# Patient Record
Sex: Female | Born: 1935 | Race: White | Hispanic: No | Smoking: Never smoker
Health system: Southern US, Community
[De-identification: ages and names within clinical notes are randomized; demographics above are authoritative.]

## PROBLEM LIST (undated history)

## (undated) DIAGNOSIS — I5032 Chronic diastolic (congestive) heart failure: Secondary | ICD-10-CM

## (undated) DIAGNOSIS — I4891 Unspecified atrial fibrillation: Secondary | ICD-10-CM

## (undated) DIAGNOSIS — I272 Pulmonary hypertension, unspecified: Secondary | ICD-10-CM

## (undated) DIAGNOSIS — H8110 Benign paroxysmal vertigo, unspecified ear: Secondary | ICD-10-CM

## (undated) DIAGNOSIS — I639 Cerebral infarction, unspecified: Secondary | ICD-10-CM

## (undated) DIAGNOSIS — D271 Benign neoplasm of left ovary: Secondary | ICD-10-CM

## (undated) DIAGNOSIS — E785 Hyperlipidemia, unspecified: Secondary | ICD-10-CM

## (undated) DIAGNOSIS — E079 Disorder of thyroid, unspecified: Secondary | ICD-10-CM

## (undated) DIAGNOSIS — I10 Essential (primary) hypertension: Secondary | ICD-10-CM

## (undated) DIAGNOSIS — H9190 Unspecified hearing loss, unspecified ear: Secondary | ICD-10-CM

## (undated) DIAGNOSIS — Z8679 Personal history of other diseases of the circulatory system: Secondary | ICD-10-CM

## (undated) HISTORY — DX: Chronic diastolic (congestive) heart failure: I50.32

## (undated) HISTORY — DX: Essential (primary) hypertension: I10

## (undated) HISTORY — DX: Hyperlipidemia, unspecified: E78.5

## (undated) HISTORY — DX: Personal history of other diseases of the circulatory system: Z86.79

## (undated) HISTORY — DX: Disorder of thyroid, unspecified: E07.9

## (undated) HISTORY — DX: Benign neoplasm of left ovary: D27.1

## (undated) HISTORY — DX: Benign paroxysmal vertigo, unspecified ear: H81.10

## (undated) HISTORY — DX: Pulmonary hypertension, unspecified: I27.20

---

## 1971-09-19 HISTORY — PX: ABDOMINAL HYSTERECTOMY: SHX81

## 1992-09-18 HISTORY — PX: HERNIA REPAIR: SHX51

## 1999-07-12 ENCOUNTER — Encounter: Admission: RE | Admit: 1999-07-12 | Discharge: 1999-10-10 | Payer: Self-pay | Admitting: Family Medicine

## 1999-08-29 ENCOUNTER — Ambulatory Visit (HOSPITAL_COMMUNITY): Admission: RE | Admit: 1999-08-29 | Discharge: 1999-08-29 | Payer: Self-pay | Admitting: Gastroenterology

## 2004-08-22 ENCOUNTER — Ambulatory Visit (HOSPITAL_COMMUNITY): Admission: RE | Admit: 2004-08-22 | Discharge: 2004-08-22 | Payer: Self-pay | Admitting: Gastroenterology

## 2005-07-20 ENCOUNTER — Observation Stay (HOSPITAL_COMMUNITY): Admission: EM | Admit: 2005-07-20 | Discharge: 2005-07-22 | Payer: Self-pay | Admitting: Gastroenterology

## 2005-08-01 ENCOUNTER — Ambulatory Visit (HOSPITAL_COMMUNITY): Admission: RE | Admit: 2005-08-01 | Discharge: 2005-08-01 | Payer: Self-pay | Admitting: Gastroenterology

## 2005-08-04 ENCOUNTER — Encounter (HOSPITAL_COMMUNITY): Admission: RE | Admit: 2005-08-04 | Discharge: 2005-09-15 | Payer: Self-pay | Admitting: Gastroenterology

## 2005-10-24 ENCOUNTER — Ambulatory Visit: Payer: Self-pay | Admitting: Ophthalmology

## 2005-11-02 ENCOUNTER — Encounter: Admission: RE | Admit: 2005-11-02 | Discharge: 2005-11-02 | Payer: Self-pay | Admitting: Gastroenterology

## 2005-11-10 ENCOUNTER — Encounter: Admission: RE | Admit: 2005-11-10 | Discharge: 2005-11-10 | Payer: Self-pay | Admitting: Ophthalmology

## 2005-12-21 LAB — HM PAP SMEAR: HM Pap smear: NORMAL

## 2010-02-20 LAB — HM DEXA SCAN: HM Dexa Scan: NORMAL

## 2011-12-22 ENCOUNTER — Encounter: Payer: Self-pay | Admitting: Internal Medicine

## 2011-12-22 ENCOUNTER — Ambulatory Visit (INDEPENDENT_AMBULATORY_CARE_PROVIDER_SITE_OTHER): Payer: Medicare Other | Admitting: Internal Medicine

## 2011-12-22 VITALS — BP 138/70 | HR 118 | Temp 98.2°F | Ht 60.5 in | Wt 206.0 lb

## 2011-12-22 DIAGNOSIS — E119 Type 2 diabetes mellitus without complications: Secondary | ICD-10-CM

## 2011-12-22 DIAGNOSIS — E039 Hypothyroidism, unspecified: Secondary | ICD-10-CM

## 2011-12-22 DIAGNOSIS — E785 Hyperlipidemia, unspecified: Secondary | ICD-10-CM

## 2011-12-22 DIAGNOSIS — I10 Essential (primary) hypertension: Secondary | ICD-10-CM

## 2011-12-22 DIAGNOSIS — Z23 Encounter for immunization: Secondary | ICD-10-CM

## 2011-12-22 NOTE — Assessment & Plan Note (Signed)
BP well controlled today. Will continue amlodipine, Lasix, and losartan hydrochlorothiazide. We'll check renal function with labs today. Followup in 3 months.

## 2011-12-22 NOTE — Assessment & Plan Note (Signed)
Will continue Synthroid. Will check TSH with labs today. Followup in 3 months.

## 2011-12-22 NOTE — Assessment & Plan Note (Signed)
Will check A1c with labs today. We'll continue metformin,glimepiride, and onglyza. Follow up 3 months.

## 2011-12-22 NOTE — Assessment & Plan Note (Signed)
We'll check lipids with labs today. Goal LDL less than 70. Followup 3 months.

## 2011-12-22 NOTE — Progress Notes (Signed)
Subjective:    Patient ID: Diane Mullins, female    DOB: 1932-12-12, 78 y.o.   MRN: [id number]  HPI 78 year old female with history of diabetes, hypertension, hyperlipidemia, and hypothyroidism presents to establish care. She reports that she is generally doing well. In regards to her diabetes, she reports full compliance with her medications. She is unsure of her last hemoglobin A1c. She did not bring a record of her blood sugars today.   In regards to her hypertension, she reports full compliance with her medications. She denies any headache, chest pain, palpitations.  In regards to hyperlipidemia, she reports full compliance with her statin medication. She denies any known side effects from this medicine such as myalgia.  In regards to hypothyroidism, she reports full compliance with Synthroid.  Outpatient Encounter Prescriptions as of 12/22/2011  Medication Sig Dispense Refill  . amLODipine (NORVASC) 5 MG tablet Take 5 mg by mouth daily.      Marland Kitchen aspirin EC 81 MG tablet Take 81 mg by mouth daily.      . calcium-vitamin D (OSCAL) 250-125 MG-UNIT per tablet Take 1 tablet by mouth daily.      . fish oil-omega-3 fatty acids 1000 MG capsule Take 2 g by mouth daily.      . furosemide (LASIX) 20 MG tablet Take 10-20 mg by mouth daily.      Marland Kitchen glimepiride (AMARYL) 4 MG tablet Take 2 mg by mouth 2 (two) times daily.      Marland Kitchen levothyroxine (SYNTHROID, LEVOTHROID) 50 MCG tablet Take 50 mcg by mouth daily.      Marland Kitchen losartan-hydrochlorothiazide (HYZAAR) 50-12.5 MG per tablet Take 1 tablet by mouth daily.      . metFORMIN (GLUCOPHAGE) 1000 MG tablet Take 1,000 mg by mouth 2 (two) times daily with a meal.      . Multiple Vitamins-Minerals (MULTIVITAMIN WITH MINERALS) tablet Take 1 tablet by mouth daily.      Marland Kitchen omeprazole (PRILOSEC) 20 MG capsule Take 20 mg by mouth daily.      . saxagliptin HCl (ONGLYZA) 5 MG TABS tablet Take 5 mg by mouth daily.      . simvastatin (ZOCOR) 40 MG tablet Take 40 mg by mouth  every evening.        Review of Systems  Constitutional: Negative for fever, chills, appetite change, fatigue and unexpected weight change.  HENT: Negative for ear pain, congestion, sore throat, trouble swallowing, neck pain, voice change and sinus pressure.   Eyes: Negative for visual disturbance.  Respiratory: Negative for cough, shortness of breath, wheezing and stridor.   Cardiovascular: Negative for chest pain, palpitations and leg swelling.  Gastrointestinal: Negative for nausea, vomiting, abdominal pain, diarrhea, constipation, blood in stool, abdominal distention and anal bleeding.  Genitourinary: Negative for dysuria and flank pain.  Musculoskeletal: Negative for myalgias, arthralgias and gait problem.  Skin: Negative for color change and rash.  Neurological: Negative for dizziness and headaches.  Hematological: Negative for adenopathy. Does not bruise/bleed easily.  Psychiatric/Behavioral: Negative for suicidal ideas, sleep disturbance and dysphoric mood. The patient is not nervous/anxious.    BP 138/70  Pulse 118  Temp(Src) 98.2 F (36.8 C) (Oral)  Ht 5' 0.5" (1.537 m)  Wt 206 lb (93.441 kg)  BMI 39.57 kg/m2  SpO2 97%     Objective:   Physical Exam  Constitutional: She is oriented to person, place, and time. She appears well-developed and well-nourished. No distress.  HENT:  Head: Normocephalic and atraumatic.  Right Ear: External ear normal.  Left Ear: External ear normal.  Nose: Nose normal.  Mouth/Throat: Oropharynx is clear and moist. No oropharyngeal exudate.  Eyes: Conjunctivae are normal. Pupils are equal, round, and reactive to light. Right eye exhibits no discharge. Left eye exhibits no discharge. No scleral icterus.  Neck: Normal range of motion. Neck supple. No tracheal deviation present. No thyromegaly present.  Cardiovascular: Normal rate, regular rhythm, normal heart sounds and intact distal pulses.  Exam reveals no gallop and no friction rub.   No  murmur heard. Pulmonary/Chest: Effort normal and breath sounds normal. No respiratory distress. She has no wheezes. She has no rales. She exhibits no tenderness.  Abdominal: Soft. Bowel sounds are normal. She exhibits no distension. There is no tenderness.  Musculoskeletal: Normal range of motion. She exhibits no edema and no tenderness.  Lymphadenopathy:    She has no cervical adenopathy.  Neurological: She is alert and oriented to person, place, and time. No cranial nerve deficit. She exhibits normal muscle tone. Coordination normal.  Skin: Skin is warm and dry. No rash noted. She is not diaphoretic. No erythema. No pallor.  Psychiatric: She has a normal mood and affect. Her behavior is normal. Judgment and thought content normal.          Assessment & Plan:

## 2011-12-25 ENCOUNTER — Other Ambulatory Visit: Payer: Self-pay | Admitting: Internal Medicine

## 2011-12-25 LAB — COMPREHENSIVE METABOLIC PANEL
AST: 17 IU/L (ref 0–40)
Albumin/Globulin Ratio: 1.8 (ref 1.1–2.5)
Albumin: 4.4 g/dL (ref 3.5–4.8)
Alkaline Phosphatase: 93 IU/L (ref 25–165)
Calcium: 9.5 mg/dL (ref 8.6–10.2)
GFR calc Af Amer: 74 mL/min/{1.73_m2} (ref >59)
GFR calc non Af Amer: 64 mL/min/{1.73_m2} (ref >59)
Globulin, Total: 2.5 g/dL (ref 1.5–4.5)
Glucose: 179 mg/dL — ABNORMAL HIGH (ref 65–99)
Potassium: 4.3 mmol/L (ref 3.5–5.2)
Sodium: 137 mmol/L (ref 134–144)
Total Bilirubin: 0.3 mg/dL (ref 0.0–1.2)
Total Protein: 6.9 g/dL (ref 6.0–8.5)

## 2011-12-25 LAB — CBC WITH DIFFERENTIAL
Basophils Absolute: 0.2 10*3/uL (ref 0.0–0.2)
Basos: 2 % (ref 0–3)
Eos: 3 % (ref 0–7)
Eosinophils Absolute: 0.2 10*3/uL (ref 0.0–0.4)
HCT: 37.7 % (ref 34.0–46.6)
Immature Granulocytes: 0 % (ref 0–2)
MCV: 83 fL (ref 79–97)
Monocytes Absolute: 0.9 10*3/uL (ref 0.1–1.0)
Monocytes: 11 % (ref 4–13)
Neutrophils Absolute: 4.6 10*3/uL (ref 1.8–7.8)
RBC: 4.56 x10E6/uL (ref 3.77–5.28)
WBC: 8.4 10*3/uL (ref 4.0–10.5)

## 2011-12-25 LAB — LIPID PANEL W/O CHOL/HDL RATIO: Cholesterol, Total: 170 mg/dL (ref 100–199)

## 2011-12-29 ENCOUNTER — Telehealth: Payer: Self-pay | Admitting: Internal Medicine

## 2011-12-29 NOTE — Telephone Encounter (Signed)
[id number] Pt called back to confirm she received you message she also would like to get onglyza  Samples if we have any.  Pt stated she had enough to get through the weekend. Please advise

## 2012-01-01 NOTE — Telephone Encounter (Signed)
Left message notifying her that we do not have onglyza samples.

## 2012-01-03 ENCOUNTER — Other Ambulatory Visit: Payer: Self-pay | Admitting: *Deleted

## 2012-01-03 MED ORDER — SAXAGLIPTIN HCL 5 MG PO TABS: 5.0000 mg | ORAL_TABLET | Freq: Every day | ORAL | Status: DC

## 2012-01-15 ENCOUNTER — Encounter: Payer: Self-pay | Admitting: Internal Medicine

## 2012-01-15 ENCOUNTER — Ambulatory Visit (INDEPENDENT_AMBULATORY_CARE_PROVIDER_SITE_OTHER): Payer: Medicare Other | Admitting: Internal Medicine

## 2012-01-15 VITALS — BP 140/74 | HR 82 | Temp 98.5°F | Resp 16 | Wt 204.2 lb

## 2012-01-15 DIAGNOSIS — J40 Bronchitis, not specified as acute or chronic: Secondary | ICD-10-CM | POA: Insufficient documentation

## 2012-01-15 MED ORDER — DOXYCYCLINE HYCLATE 100 MG PO TABS: 100.0000 mg | ORAL_TABLET | Freq: Two times a day (BID) | ORAL | Status: DC

## 2012-01-15 MED ORDER — GUAIFENESIN-CODEINE 100-10 MG/5ML PO SYRP: 5.0000 mL | ORAL_SOLUTION | Freq: Two times a day (BID) | ORAL | Status: AC | PRN

## 2012-01-15 MED ORDER — ALBUTEROL SULFATE HFA 108 (90 BASE) MCG/ACT IN AERS: 2.0000 | INHALATION_SPRAY | Freq: Four times a day (QID) | RESPIRATORY_TRACT | Status: DC | PRN

## 2012-01-15 MED ORDER — PREDNISONE (PAK) 10 MG PO TABS: ORAL_TABLET | ORAL | Status: AC

## 2012-01-15 NOTE — Progress Notes (Signed)
Subjective:    Patient ID: Diane Mullins, female    DOB: 04-24-1933, 78 y.o.   MRN: [id number]  HPI 78 year old female with history of diabetes presents with a one-week history of cough, shortness of breath, and malaise. She reports that symptoms first began 1 week ago with sore throat. Sore throat has now resolved. She is now developed cough which is generally nonproductive. She also has purulent sinus drainage. She denies any headache. She denies fever or chills. She has mild shortness of breath with exertion. She has taken over-the-counter Delsym with no improvement in her symptoms.  Outpatient Encounter Prescriptions as of 01/15/2012  Medication Sig Dispense Refill  . amLODipine (NORVASC) 5 MG tablet Take 5 mg by mouth daily.      Marland Kitchen aspirin EC 81 MG tablet Take 81 mg by mouth daily.      . calcium-vitamin D (OSCAL) 250-125 MG-UNIT per tablet Take 1 tablet by mouth daily.      . fish oil-omega-3 fatty acids 1000 MG capsule Take 2 g by mouth daily.      . furosemide (LASIX) 20 MG tablet Take 10-20 mg by mouth daily.      Marland Kitchen glimepiride (AMARYL) 4 MG tablet Take 2 mg by mouth 2 (two) times daily.      Marland Kitchen levothyroxine (SYNTHROID, LEVOTHROID) 50 MCG tablet Take 50 mcg by mouth daily.      Marland Kitchen losartan-hydrochlorothiazide (HYZAAR) 50-12.5 MG per tablet Take 1 tablet by mouth daily.      . metFORMIN (GLUCOPHAGE) 1000 MG tablet Take 1,000 mg by mouth 2 (two) times daily with a meal.      . Multiple Vitamins-Minerals (MULTIVITAMIN WITH MINERALS) tablet Take 1 tablet by mouth daily.      Marland Kitchen omeprazole (PRILOSEC) 20 MG capsule Take 20 mg by mouth daily.      . saxagliptin HCl (ONGLYZA) 5 MG TABS tablet Take 1 tablet (5 mg total) by mouth daily.  14 tablet  0  . simvastatin (ZOCOR) 40 MG tablet Take 40 mg by mouth every evening.      Marland Kitchen albuterol (PROVENTIL HFA;VENTOLIN HFA) 108 (90 BASE) MCG/ACT inhaler Inhale 2 puffs into the lungs every 6 (six) hours as needed for wheezing.  1 Inhaler  0  . doxycycline  (VIBRA-TABS) 100 MG tablet Take 1 tablet (100 mg total) by mouth 2 (two) times daily.  20 tablet  0  . guaiFENesin-codeine (ROBITUSSIN AC) 100-10 MG/5ML syrup Take 5 mLs by mouth 2 (two) times daily as needed for cough.  240 mL  0  . predniSONE (STERAPRED UNI-PAK) 10 MG tablet Take 60mg  day 1 then taper by 10mg  daily  21 tablet  0     BP 140/74  Pulse 82  Temp(Src) 98.5 F (36.9 C) (Oral)  Resp 16  Wt 204 lb 4 oz (92.647 kg)  Review of Systems  Constitutional: Positive for fatigue. Negative for fever, chills, appetite change and unexpected weight change.  HENT: Positive for congestion, sore throat, rhinorrhea, postnasal drip and sinus pressure. Negative for ear pain, trouble swallowing, neck pain and voice change.   Eyes: Negative for visual disturbance.  Respiratory: Positive for cough and shortness of breath. Negative for wheezing and stridor.   Cardiovascular: Negative for chest pain, palpitations and leg swelling.  Gastrointestinal: Negative for nausea, vomiting, abdominal pain, diarrhea, constipation, blood in stool, abdominal distention and anal bleeding.  Genitourinary: Negative for dysuria and flank pain.  Musculoskeletal: Negative for myalgias, arthralgias and gait problem.  Skin: Negative  for color change and rash.  Neurological: Negative for dizziness and headaches.  Hematological: Negative for adenopathy. Does not bruise/bleed easily.  Psychiatric/Behavioral: Negative for suicidal ideas, sleep disturbance and dysphoric mood. The patient is not nervous/anxious.        Objective:   Physical Exam  Constitutional: She is oriented to person, place, and time. She appears well-developed and well-nourished. No distress.  HENT:  Head: Normocephalic and atraumatic.  Right Ear: External ear normal.  Left Ear: External ear normal.  Nose: Nose normal.  Mouth/Throat: Oropharynx is clear and moist. No oropharyngeal exudate.  Eyes: Conjunctivae are normal. Pupils are equal, round,  and reactive to light. Right eye exhibits no discharge. Left eye exhibits no discharge. No scleral icterus.  Neck: Normal range of motion. Neck supple. No tracheal deviation present. No thyromegaly present.  Cardiovascular: Normal rate, regular rhythm, normal heart sounds and intact distal pulses.  Exam reveals no gallop and no friction rub.   No murmur heard. Pulmonary/Chest: Effort normal. No accessory muscle usage. Not tachypneic. No respiratory distress. She has decreased breath sounds (prolonged expiration). She has wheezes. She has no rhonchi. She has no rales. She exhibits no tenderness.  Musculoskeletal: Normal range of motion. She exhibits no edema and no tenderness.  Lymphadenopathy:    She has no cervical adenopathy.  Neurological: She is alert and oriented to person, place, and time. No cranial nerve deficit. She exhibits normal muscle tone. Coordination normal.  Skin: Skin is warm and dry. No rash noted. She is not diaphoretic. No erythema. No pallor.  Psychiatric: She has a normal mood and affect. Her behavior is normal. Judgment and thought content normal.          Assessment & Plan:

## 2012-01-15 NOTE — Assessment & Plan Note (Signed)
Symptoms and exam are most consistent with bronchitis. Will treat with doxycycline and prednisone taper. Will start albuterol inhaler. Will use codeine as needed for cough. Patient will followup in 4 days for recheck.

## 2012-01-22 ENCOUNTER — Encounter: Payer: Self-pay | Admitting: Internal Medicine

## 2012-01-22 ENCOUNTER — Ambulatory Visit: Payer: Self-pay | Admitting: Internal Medicine

## 2012-01-22 ENCOUNTER — Ambulatory Visit (INDEPENDENT_AMBULATORY_CARE_PROVIDER_SITE_OTHER): Payer: Medicare Other | Admitting: Internal Medicine

## 2012-01-22 VITALS — BP 112/74 | HR 108 | Temp 97.5°F | Resp 16 | Wt 201.2 lb

## 2012-01-22 DIAGNOSIS — J40 Bronchitis, not specified as acute or chronic: Secondary | ICD-10-CM

## 2012-01-22 MED ORDER — AMOXICILLIN-POT CLAVULANATE 875-125 MG PO TABS: 1.0000 | ORAL_TABLET | Freq: Two times a day (BID) | ORAL | Status: DC

## 2012-01-22 NOTE — Progress Notes (Signed)
Subjective:    Patient ID: Diane Mullins, female    DOB: July 11, 1933, 77 y.o.   MRN: [id number]  HPI 78YO female with h/o DM and  HTN presents for follow up after recent episode of bronchitis. Notes generally feeling poorly with some nausea and "jitteriness."  Denies fever, chills, dyspnea.  Continues to have non-productive cough. Reports full compliance with prednisone and doxycyline. Has not used cough medication or albuterol for 48hr.  Notes some elevated BG in 200s especially initially on Prednisone.  Notes fasting BG today was improved at 102.    Outpatient Encounter Prescriptions as of 01/22/2012  Medication Sig Dispense Refill  . albuterol (PROVENTIL HFA;VENTOLIN HFA) 108 (90 BASE) MCG/ACT inhaler Inhale 2 puffs into the lungs every 6 (six) hours as needed for wheezing.  1 Inhaler  0  . amLODipine (NORVASC) 5 MG tablet Take 5 mg by mouth daily.      Marland Kitchen aspirin EC 81 MG tablet Take 81 mg by mouth daily.      . calcium-vitamin D (OSCAL) 250-125 MG-UNIT per tablet Take 1 tablet by mouth daily.      . fish oil-omega-3 fatty acids 1000 MG capsule Take 2 g by mouth daily.      . furosemide (LASIX) 20 MG tablet Take 10-20 mg by mouth daily.      Marland Kitchen glimepiride (AMARYL) 4 MG tablet Take 2 mg by mouth 2 (two) times daily.      Marland Kitchen guaiFENesin-codeine (ROBITUSSIN AC) 100-10 MG/5ML syrup Take 5 mLs by mouth 2 (two) times daily as needed for cough.  240 mL  0  . levothyroxine (SYNTHROID, LEVOTHROID) 50 MCG tablet Take 50 mcg by mouth daily.      Marland Kitchen losartan-hydrochlorothiazide (HYZAAR) 50-12.5 MG per tablet Take 1 tablet by mouth daily.      . metFORMIN (GLUCOPHAGE) 1000 MG tablet Take 1,000 mg by mouth 2 (two) times daily with a meal.      . Multiple Vitamins-Minerals (MULTIVITAMIN WITH MINERALS) tablet Take 1 tablet by mouth daily.      Marland Kitchen omeprazole (PRILOSEC) 20 MG capsule Take 20 mg by mouth daily.      . predniSONE (STERAPRED UNI-PAK) 10 MG tablet Take 60mg  day 1 then taper by 10mg  daily  21 tablet  0   . saxagliptin HCl (ONGLYZA) 5 MG TABS tablet Take 1 tablet (5 mg total) by mouth daily.  14 tablet  0  . simvastatin (ZOCOR) 40 MG tablet Take 40 mg by mouth every evening.      Marland Kitchen DISCONTD: doxycycline (VIBRA-TABS) 100 MG tablet Take 1 tablet (100 mg total) by mouth 2 (two) times daily.  20 tablet  0  . amoxicillin-clavulanate (AUGMENTIN) 875-125 MG per tablet Take 1 tablet by mouth 2 (two) times daily.  20 tablet  0   BP 112/74  Pulse 108  Temp(Src) 97.5 F (36.4 C) (Oral)  Resp 16  Wt 201 lb 4 oz (91.286 kg)  SpO2 94%  Review of Systems  Constitutional: Negative for fever, chills and unexpected weight change.  HENT: Negative for hearing loss, ear pain, nosebleeds, congestion, sore throat, facial swelling, rhinorrhea, sneezing, mouth sores, trouble swallowing, neck pain, neck stiffness, voice change, postnasal drip, sinus pressure, tinnitus and ear discharge.   Eyes: Negative for pain, discharge, redness and visual disturbance.  Respiratory: Positive for cough. Negative for chest tightness, shortness of breath, wheezing and stridor.   Cardiovascular: Positive for palpitations. Negative for chest pain and leg swelling.  Musculoskeletal: Negative for myalgias  and arthralgias.  Skin: Negative for color change and rash.  Neurological: Negative for dizziness, weakness, light-headedness and headaches.  Hematological: Negative for adenopathy.       Objective:   Physical Exam  Constitutional: She is oriented to person, place, and time. She appears well-developed and well-nourished. No distress.  HENT:  Head: Normocephalic and atraumatic.  Right Ear: External ear normal.  Left Ear: External ear normal.  Nose: Nose normal.  Mouth/Throat: Oropharynx is clear and moist. No oropharyngeal exudate.  Eyes: Conjunctivae are normal. Pupils are equal, round, and reactive to light. Right eye exhibits no discharge. Left eye exhibits no discharge. No scleral icterus.  Neck: Normal range of motion.  Neck supple. No tracheal deviation present. No thyromegaly present.  Cardiovascular: Normal rate, regular rhythm, normal heart sounds and intact distal pulses.  Exam reveals no gallop and no friction rub.   No murmur heard. Pulmonary/Chest: Effort normal. No accessory muscle usage. Not tachypneic. No respiratory distress. She has decreased breath sounds in the right lower field. She has no wheezes. She has rhonchi in the right lower field. She has no rales. She exhibits no tenderness.  Musculoskeletal: Normal range of motion. She exhibits no edema and no tenderness.  Lymphadenopathy:    She has no cervical adenopathy.  Neurological: She is alert and oriented to person, place, and time. No cranial nerve deficit. She exhibits normal muscle tone. Coordination normal.  Skin: Skin is warm and dry. No rash noted. She is not diaphoretic. No erythema. No pallor.  Psychiatric: She has a normal mood and affect. Her behavior is normal. Judgment and thought content normal.          Assessment & Plan:

## 2012-01-22 NOTE — Assessment & Plan Note (Signed)
Minimal improvement in symptoms. Crackles RLL on exam with poor sound transmission. Will get CXR.  Will change antibiotics to Augmentin. Follow up 1 week and prn.

## 2012-01-23 ENCOUNTER — Telehealth: Payer: Self-pay | Admitting: Internal Medicine

## 2012-01-23 NOTE — Telephone Encounter (Signed)
CXR was normal. I would still like for her to finish the antibiotics.

## 2012-01-23 NOTE — Telephone Encounter (Signed)
Left detailed message notifying patient.

## 2012-01-29 ENCOUNTER — Encounter: Payer: Self-pay | Admitting: Internal Medicine

## 2012-01-31 ENCOUNTER — Ambulatory Visit (INDEPENDENT_AMBULATORY_CARE_PROVIDER_SITE_OTHER): Payer: Medicare Other | Admitting: Internal Medicine

## 2012-01-31 ENCOUNTER — Encounter: Payer: Self-pay | Admitting: Internal Medicine

## 2012-01-31 VITALS — BP 138/81 | HR 78 | Temp 98.1°F | Resp 14 | Wt 204.0 lb

## 2012-01-31 DIAGNOSIS — F419 Anxiety disorder, unspecified: Secondary | ICD-10-CM

## 2012-01-31 DIAGNOSIS — E119 Type 2 diabetes mellitus without complications: Secondary | ICD-10-CM

## 2012-01-31 DIAGNOSIS — F411 Generalized anxiety disorder: Secondary | ICD-10-CM

## 2012-01-31 DIAGNOSIS — I10 Essential (primary) hypertension: Secondary | ICD-10-CM

## 2012-01-31 DIAGNOSIS — J40 Bronchitis, not specified as acute or chronic: Secondary | ICD-10-CM

## 2012-01-31 MED ORDER — SIMVASTATIN 40 MG PO TABS: 40.0000 mg | ORAL_TABLET | Freq: Every evening | ORAL | Status: DC

## 2012-01-31 MED ORDER — LOSARTAN POTASSIUM-HCTZ 50-12.5 MG PO TABS: 1.0000 | ORAL_TABLET | Freq: Every day | ORAL | Status: DC

## 2012-01-31 MED ORDER — FUROSEMIDE 20 MG PO TABS: 10.0000 mg | ORAL_TABLET | Freq: Every day | ORAL | Status: DC

## 2012-01-31 MED ORDER — OMEPRAZOLE 20 MG PO CPDR: 20.0000 mg | DELAYED_RELEASE_CAPSULE | Freq: Every day | ORAL | Status: DC

## 2012-01-31 MED ORDER — LEVOTHYROXINE SODIUM 50 MCG PO TABS: 50.0000 ug | ORAL_TABLET | Freq: Every day | ORAL | Status: DC

## 2012-01-31 MED ORDER — AMLODIPINE BESYLATE 5 MG PO TABS: 5.0000 mg | ORAL_TABLET | Freq: Every day | ORAL | Status: DC

## 2012-01-31 MED ORDER — ALPRAZOLAM 0.25 MG PO TABS: 0.2500 mg | ORAL_TABLET | Freq: Three times a day (TID) | ORAL | Status: DC | PRN

## 2012-01-31 MED ORDER — ALPRAZOLAM 0.25 MG PO TABS: 0.2500 mg | ORAL_TABLET | Freq: Three times a day (TID) | ORAL | Status: AC | PRN

## 2012-01-31 MED ORDER — METFORMIN HCL 1000 MG PO TABS: 1000.0000 mg | ORAL_TABLET | Freq: Two times a day (BID) | ORAL | Status: DC

## 2012-01-31 MED ORDER — GLIMEPIRIDE 4 MG PO TABS: 2.0000 mg | ORAL_TABLET | Freq: Two times a day (BID) | ORAL | Status: DC

## 2012-01-31 MED ORDER — SAXAGLIPTIN HCL 5 MG PO TABS: 5.0000 mg | ORAL_TABLET | Freq: Every day | ORAL | Status: DC

## 2012-01-31 NOTE — Assessment & Plan Note (Signed)
Symptoms recently worsened. Has used prn xanax in the past with relief. Will restart this. Discussed potential side effect of drowsiness. She will not drive while taking this medication.She will call if symptoms are persistent.

## 2012-01-31 NOTE — Assessment & Plan Note (Signed)
Recent A1c was elevated. Pt reports better control of BG over last few weeks. Will recheck A1c in 03/2012.  Continue current medications.

## 2012-01-31 NOTE — Progress Notes (Signed)
Subjective:    Patient ID: Diane Mullins, female    DOB: 05/22/1933, 78 y.o.   MRN: [id number]  HPI 78YO female with DM, HTN, hypothyroidism, and hyperlipidemia presents for follow up.  She recently had episode of bronchitis that was treated with antibiotics and bronchodilators. She reports that her breathing has much improved. She denies any ongoing cough. In regards to her diabetes, her recent A1c was noted to be elevated. She reports that she has been monitoring her diet more closely and her blood sugars have been better controlled. She did not bring a record of her blood sugars today. She reports full compliance with her medications.  In regards to hypertension and HL, she reports full compliance with medications.  She is concerned today about recent worsening of anxiety. In the past, she had used Xanax for this. Anxiety occurs intermittently. She denies any new stressors at home. She reports feeling safe in her home. She denies any concerns about health or finances. She denies depressed mood.   Outpatient Encounter Prescriptions as of 01/31/2012  Medication Sig Dispense Refill  . amLODipine (NORVASC) 5 MG tablet Take 1 tablet (5 mg total) by mouth daily.  90 tablet  3  . aspirin EC 81 MG tablet Take 81 mg by mouth daily.      . calcium-vitamin D (OSCAL) 250-125 MG-UNIT per tablet Take 1 tablet by mouth daily.      . fish oil-omega-3 fatty acids 1000 MG capsule Take 2 g by mouth daily.      . furosemide (LASIX) 20 MG tablet Take 0.5-1 tablets (10-20 mg total) by mouth daily.  90 tablet  3  . glimepiride (AMARYL) 4 MG tablet Take 0.5 tablets (2 mg total) by mouth 2 (two) times daily.  90 tablet  3  . levothyroxine (SYNTHROID, LEVOTHROID) 50 MCG tablet Take 1 tablet (50 mcg total) by mouth daily.  90 tablet  3  . losartan-hydrochlorothiazide (HYZAAR) 50-12.5 MG per tablet Take 1 tablet by mouth daily.  90 tablet  3  . metFORMIN (GLUCOPHAGE) 1000 MG tablet Take 1 tablet (1,000 mg total) by mouth  2 (two) times daily with a meal.  180 tablet  3  . Multiple Vitamins-Minerals (MULTIVITAMIN WITH MINERALS) tablet Take 1 tablet by mouth daily.      Marland Kitchen omeprazole (PRILOSEC) 20 MG capsule Take 1 capsule (20 mg total) by mouth daily.  90 capsule  3  . saxagliptin HCl (ONGLYZA) 5 MG TABS tablet Take 1 tablet (5 mg total) by mouth daily.  90 tablet  3  . simvastatin (ZOCOR) 40 MG tablet Take 1 tablet (40 mg total) by mouth every evening.  90 tablet  3  . ALPRAZolam (XANAX) 0.25 MG tablet Take 1 tablet (0.25 mg total) by mouth 3 (three) times daily as needed for sleep or anxiety.  180 tablet  1    Review of Systems  Constitutional: Negative for fever, chills, appetite change, fatigue and unexpected weight change.  HENT: Negative for ear pain, congestion, sore throat, trouble swallowing, neck pain, voice change and sinus pressure.   Eyes: Negative for visual disturbance.  Respiratory: Negative for cough, shortness of breath, wheezing and stridor.   Cardiovascular: Negative for chest pain, palpitations and leg swelling.  Gastrointestinal: Negative for nausea, abdominal pain, diarrhea, constipation, blood in stool, abdominal distention and anal bleeding.  Genitourinary: Negative for dysuria and flank pain.  Musculoskeletal: Negative for myalgias, arthralgias and gait problem.  Skin: Negative for color change and rash.  Neurological:  Negative for dizziness and headaches.  Hematological: Negative for adenopathy. Does not bruise/bleed easily.  Psychiatric/Behavioral: Negative for suicidal ideas, sleep disturbance and dysphoric mood. The patient is nervous/anxious.    BP 138/81  Pulse 78  Temp(Src) 98.1 F (36.7 C) (Oral)  Resp 14  Wt 204 lb (92.534 kg)  SpO2 97%     Objective:   Physical Exam  Constitutional: She is oriented to person, place, and time. She appears well-developed and well-nourished. No distress.  HENT:  Head: Normocephalic and atraumatic.  Right Ear: External ear normal.    Left Ear: External ear normal.  Nose: Nose normal.  Mouth/Throat: Oropharynx is clear and moist. No oropharyngeal exudate.  Eyes: Conjunctivae are normal. Pupils are equal, round, and reactive to light. Right eye exhibits no discharge. Left eye exhibits no discharge. No scleral icterus.  Neck: Normal range of motion. Neck supple. No tracheal deviation present. No thyromegaly present.  Cardiovascular: Normal rate, regular rhythm, normal heart sounds and intact distal pulses.  Exam reveals no gallop and no friction rub.   No murmur heard. Pulmonary/Chest: Effort normal and breath sounds normal. No respiratory distress. She has no wheezes. She has no rales. She exhibits no tenderness.  Musculoskeletal: Normal range of motion. She exhibits no edema and no tenderness.  Lymphadenopathy:    She has no cervical adenopathy.  Neurological: She is alert and oriented to person, place, and time. No cranial nerve deficit. She exhibits normal muscle tone. Coordination normal.  Skin: Skin is warm and dry. No rash noted. She is not diaphoretic. No erythema. No pallor.  Psychiatric: She has a normal mood and affect. Her behavior is normal. Judgment and thought content normal.          Assessment & Plan:

## 2012-01-31 NOTE — Assessment & Plan Note (Signed)
BP well controlled today. Will continue current medications. Follow up 3 months.

## 2012-01-31 NOTE — Assessment & Plan Note (Signed)
Symptoms have resolved.

## 2012-03-26 ENCOUNTER — Encounter: Payer: Self-pay | Admitting: Internal Medicine

## 2012-05-06 ENCOUNTER — Ambulatory Visit: Payer: Medicare Other | Admitting: Internal Medicine

## 2012-05-22 ENCOUNTER — Telehealth: Payer: Self-pay | Admitting: *Deleted

## 2012-05-22 NOTE — Telephone Encounter (Signed)
Fine to order CMP, A1c, TSH, urine microalbumin, lipid profile

## 2012-05-22 NOTE — Telephone Encounter (Signed)
Patient wants to know if she can have a written order to have labs done at [place] before her appt with Dr. Gilford Rile on 05/27/2012 three month follow up?  Is this ok or should patient have visit first?  Patient also requested samples of Onglyza but we have no samples at this time.  Please advise.

## 2012-05-22 NOTE — Telephone Encounter (Signed)
Written order for below labs left at front desk for patient pick up, patient advised via telephone.

## 2012-05-24 ENCOUNTER — Other Ambulatory Visit: Payer: Self-pay | Admitting: Internal Medicine

## 2012-05-25 LAB — COMPREHENSIVE METABOLIC PANEL
ALT: 15 IU/L (ref 0–32)
AST: 14 IU/L (ref 0–40)
Albumin/Globulin Ratio: 1.6 (ref 1.1–2.5)
Alkaline Phosphatase: 92 IU/L (ref 45–108)
BUN: 14 mg/dL (ref 8–27)
CO2: 24 mmol/L (ref 19–28)
GFR calc Af Amer: 86 mL/min/{1.73_m2} (ref >59)
Globulin, Total: 2.4 g/dL (ref 1.5–4.5)
Potassium: 3.7 mmol/L (ref 3.5–5.2)
Sodium: 137 mmol/L (ref 134–144)
Total Bilirubin: 0.3 mg/dL (ref 0.0–1.2)

## 2012-05-25 LAB — LIPID PANEL W/O CHOL/HDL RATIO
Cholesterol, Total: 172 mg/dL (ref 100–199)
HDL: 50 mg/dL (ref >39)
LDL Calculated: 87 mg/dL (ref 0–99)
VLDL Cholesterol Cal: 35 mg/dL (ref 5–40)

## 2012-05-27 ENCOUNTER — Ambulatory Visit (INDEPENDENT_AMBULATORY_CARE_PROVIDER_SITE_OTHER): Payer: Medicare Other | Admitting: Internal Medicine

## 2012-05-27 ENCOUNTER — Encounter: Payer: Self-pay | Admitting: Internal Medicine

## 2012-05-27 VITALS — BP 126/70 | HR 60 | Temp 97.6°F | Ht 61.0 in | Wt 204.0 lb

## 2012-05-27 DIAGNOSIS — E119 Type 2 diabetes mellitus without complications: Secondary | ICD-10-CM

## 2012-05-27 DIAGNOSIS — R5383 Other fatigue: Secondary | ICD-10-CM

## 2012-05-27 DIAGNOSIS — E039 Hypothyroidism, unspecified: Secondary | ICD-10-CM

## 2012-05-27 DIAGNOSIS — Z23 Encounter for immunization: Secondary | ICD-10-CM

## 2012-05-27 MED ORDER — LEVOTHYROXINE SODIUM 75 MCG PO TABS: 75.0000 ug | ORAL_TABLET | Freq: Every day | ORAL | Status: DC

## 2012-05-27 NOTE — Assessment & Plan Note (Signed)
Blood sugar control is slightly improved with reduction of A1c from 9-8.7%. However, still above goal of less than 7%. Question whether her thyroid dysfunction may be contributing to poor glucose control. Patient reports full compliance with medication. Encouraged efforts at healthy diet with limited intake of processed carbohydrates. We'll continue current medications for now. If no improvement at next visit, we'll consider adding long-acting insulin.

## 2012-05-27 NOTE — Assessment & Plan Note (Signed)
Recent TSH elevated at 4.2. Symptoms of fatigue have been progressive. Will try increasing dose of Synthroid to 75 mcg daily. Repeat TSH and free T4 in 4-6 weeks.

## 2012-05-27 NOTE — Progress Notes (Signed)
Subjective:    Patient ID: Diane Mullins, female    DOB: [birth year], 78 y.o.   MRN: [id number]  HPI 78 year old female with history of hypertension, diabetes, hyperlipidemia, and hypothyroidism presents for followup. In regards to her diabetes, recent A1c was 8.7% which is slightly improved compared to previous A1c of 9%. She reports that sugars have been quite. Full, with typical fasting sugars near 120-140. She reports full compliance with her medications. She denies any elevated blood sugars greater than 200. She reports some dietary indiscretion.  She notes concern about ongoing fatigue. This is been persistent for several months. She continues to function and perform her daily activities but reports generalized feeling of exhaustion. She denies any chest pain, shortness of breath, change in bowel habits, change in appetite, or other focal symptoms.  Outpatient Encounter Prescriptions as of 05/27/2012  Medication Sig Dispense Refill  . amLODipine (NORVASC) 5 MG tablet Take 1 tablet (5 mg total) by mouth daily.  90 tablet  3  . aspirin EC 81 MG tablet Take 81 mg by mouth daily.      . calcium-vitamin D (OSCAL) 250-125 MG-UNIT per tablet Take 1 tablet by mouth daily.      . fish oil-omega-3 fatty acids 1000 MG capsule Take 2 g by mouth daily.      . furosemide (LASIX) 20 MG tablet Take 0.5-1 tablets (10-20 mg total) by mouth daily.  90 tablet  3  . glimepiride (AMARYL) 4 MG tablet Take 0.5 tablets (2 mg total) by mouth 2 (two) times daily.  90 tablet  3  . levothyroxine (SYNTHROID, LEVOTHROID) 75 MCG tablet Take 1 tablet (75 mcg total) by mouth daily.  90 tablet  3  . losartan-hydrochlorothiazide (HYZAAR) 50-12.5 MG per tablet Take 1 tablet by mouth daily.  90 tablet  3  . metFORMIN (GLUCOPHAGE) 1000 MG tablet Take 1 tablet (1,000 mg total) by mouth 2 (two) times daily with a meal.  180 tablet  3  . Multiple Vitamins-Minerals (MULTIVITAMIN WITH MINERALS) tablet Take 1 tablet by mouth daily.        Marland Kitchen omeprazole (PRILOSEC) 20 MG capsule Take 1 capsule (20 mg total) by mouth daily.  90 capsule  3  . saxagliptin HCl (ONGLYZA) 5 MG TABS tablet Take 1 tablet (5 mg total) by mouth daily.  90 tablet  3  . simvastatin (ZOCOR) 40 MG tablet Take 1 tablet (40 mg total) by mouth every evening.  90 tablet  3  . DISCONTD: levothyroxine (SYNTHROID, LEVOTHROID) 50 MCG tablet Take 1 tablet (50 mcg total) by mouth daily.  90 tablet  3   BP 126/70  Pulse 60  Temp 97.6 F (36.4 C) (Oral)  Ht 5\' 1"  (1.549 m)  Wt 204 lb (92.534 kg)  BMI 38.55 kg/m2  SpO2 98%  Review of Systems  Constitutional: Positive for fatigue. Negative for fever, chills, appetite change and unexpected weight change.  HENT: Negative for ear pain, congestion, sore throat, trouble swallowing, neck pain, voice change and sinus pressure.   Eyes: Negative for visual disturbance.  Respiratory: Negative for cough, shortness of breath, wheezing and stridor.   Cardiovascular: Negative for chest pain, palpitations and leg swelling.  Gastrointestinal: Negative for nausea, vomiting, abdominal pain, diarrhea, constipation, blood in stool, abdominal distention and anal bleeding.  Genitourinary: Negative for dysuria and flank pain.  Musculoskeletal: Negative for myalgias, arthralgias and gait problem.  Skin: Negative for color change and rash.  Neurological: Negative for dizziness and headaches.  Hematological: Negative  for adenopathy. Does not bruise/bleed easily.  Psychiatric/Behavioral: Negative for suicidal ideas, disturbed wake/sleep cycle and dysphoric mood. The patient is not nervous/anxious.        Objective:   Physical Exam  Constitutional: She is oriented to person, place, and time. She appears well-developed and well-nourished. No distress.  HENT:  Head: Normocephalic and atraumatic.  Right Ear: External ear normal.  Left Ear: External ear normal.  Nose: Nose normal.  Mouth/Throat: Oropharynx is clear and moist. No  oropharyngeal exudate.  Eyes: Conjunctivae are normal. Pupils are equal, round, and reactive to light. Right eye exhibits no discharge. Left eye exhibits no discharge. No scleral icterus.  Neck: Normal range of motion. Neck supple. No tracheal deviation present. No thyromegaly present.  Cardiovascular: Normal rate, regular rhythm, normal heart sounds and intact distal pulses.  Exam reveals no gallop and no friction rub.   No murmur heard. Pulmonary/Chest: Effort normal and breath sounds normal. No respiratory distress. She has no wheezes. She has no rales. She exhibits no tenderness.  Musculoskeletal: Normal range of motion. She exhibits no edema and no tenderness.  Lymphadenopathy:    She has no cervical adenopathy.  Neurological: She is alert and oriented to person, place, and time. No cranial nerve deficit. She exhibits normal muscle tone. Coordination normal.  Skin: Skin is warm and dry. No rash noted. She is not diaphoretic. No erythema. No pallor.  Psychiatric: She has a normal mood and affect. Her behavior is normal. Judgment and thought content normal.          Assessment & Plan:

## 2012-05-27 NOTE — Assessment & Plan Note (Signed)
Patient with ongoing symptoms of fatigue. Suspect this may be related to thyroid dysfunction. As below, will increase dose of Synthroid to 75 mcg daily and repeat TSH in 4-6 weeks. If no improvement, will consider sleep study.

## 2012-07-10 ENCOUNTER — Other Ambulatory Visit: Payer: Self-pay | Admitting: Internal Medicine

## 2012-07-11 LAB — TSH: TSH: 2.07 u[IU]/mL (ref 0.450–4.500)

## 2012-07-15 ENCOUNTER — Ambulatory Visit (INDEPENDENT_AMBULATORY_CARE_PROVIDER_SITE_OTHER): Payer: Medicare Other | Admitting: Internal Medicine

## 2012-07-15 ENCOUNTER — Encounter: Payer: Self-pay | Admitting: Internal Medicine

## 2012-07-15 VITALS — BP 120/60 | HR 97 | Temp 98.3°F | Ht 61.0 in | Wt 205.0 lb

## 2012-07-15 DIAGNOSIS — E039 Hypothyroidism, unspecified: Secondary | ICD-10-CM

## 2012-07-15 DIAGNOSIS — R5383 Other fatigue: Secondary | ICD-10-CM

## 2012-07-15 DIAGNOSIS — E119 Type 2 diabetes mellitus without complications: Secondary | ICD-10-CM

## 2012-07-15 MED ORDER — GLUCOSE BLOOD VI STRP: ORAL_STRIP | Status: DC

## 2012-07-15 NOTE — Assessment & Plan Note (Signed)
Patient reports good control of blood sugars. Will plan to continue current medications. Repeat A1c in January 2014.

## 2012-07-15 NOTE — Progress Notes (Signed)
Subjective:    Patient ID: Diane Mullins, female    DOB: 07/31/1933, 78 y.o.   MRN: [id number]  HPI 78 year old female with history of hypertension, diabetes, hyperlipidemia presents for followup. At her last visit, she complained of daytime fatigue. At that point, TSH was slightly elevated so we opted to try increasing her dose of Synthroid. She reports some improvement in symptoms of fatigue since starting higher dose of Synthroid. Repeat thyroid function showed TSH of declined to 2. However, she continues to have some daytime somnolence. She notes that she wakes frequently throughout the night. She has never had testing for sleep apnea.  In regards to diabetes, she reports that blood sugars have been better controlled recently. She did not bring a record of blood sugars today however reports that morning blood sugar this morning was 110. She reports full compliance with her medications. She notes that she will not be able to continue to afford Onglyza because of cost.  Outpatient Encounter Prescriptions as of 07/15/2012  Medication Sig Dispense Refill  . amLODipine (NORVASC) 5 MG tablet Take 1 tablet (5 mg total) by mouth daily.  90 tablet  3  . aspirin EC 81 MG tablet Take 81 mg by mouth daily.      . calcium-vitamin D (OSCAL) 250-125 MG-UNIT per tablet Take 1 tablet by mouth daily.      . fish oil-omega-3 fatty acids 1000 MG capsule Take 2 g by mouth daily.      . furosemide (LASIX) 20 MG tablet Take 0.5-1 tablets (10-20 mg total) by mouth daily.  90 tablet  3  . glimepiride (AMARYL) 4 MG tablet Take 0.5 tablets (2 mg total) by mouth 2 (two) times daily.  90 tablet  3  . levothyroxine (SYNTHROID, LEVOTHROID) 75 MCG tablet Take 1 tablet (75 mcg total) by mouth daily.  90 tablet  3  . losartan-hydrochlorothiazide (HYZAAR) 50-12.5 MG per tablet Take 1 tablet by mouth daily.  90 tablet  3  . metFORMIN (GLUCOPHAGE) 1000 MG tablet Take 1 tablet (1,000 mg total) by mouth 2 (two) times daily with a  meal.  180 tablet  3  . Multiple Vitamins-Minerals (MULTIVITAMIN WITH MINERALS) tablet Take 1 tablet by mouth daily.      Marland Kitchen omeprazole (PRILOSEC) 20 MG capsule Take 1 capsule (20 mg total) by mouth daily.  90 capsule  3  . saxagliptin HCl (ONGLYZA) 5 MG TABS tablet Take 1 tablet (5 mg total) by mouth daily.  90 tablet  3  . simvastatin (ZOCOR) 40 MG tablet Take 1 tablet (40 mg total) by mouth every evening.  90 tablet  3  . ALPRAZolam (XANAX) 0.25 MG tablet       . glucose blood test strip Use as instructed  100 each  12    Review of Systems  Constitutional: Positive for fatigue. Negative for fever, chills, appetite change and unexpected weight change.  HENT: Negative for ear pain, congestion, sore throat, trouble swallowing, neck pain, voice change and sinus pressure.   Eyes: Negative for visual disturbance.  Respiratory: Negative for cough, shortness of breath, wheezing and stridor.   Cardiovascular: Negative for chest pain, palpitations and leg swelling.  Gastrointestinal: Negative for nausea, vomiting, abdominal pain, diarrhea, constipation, blood in stool, abdominal distention and anal bleeding.  Genitourinary: Negative for dysuria and flank pain.  Musculoskeletal: Negative for myalgias, arthralgias and gait problem.  Skin: Negative for color change and rash.  Neurological: Negative for dizziness and headaches.  Hematological: Negative for  adenopathy. Does not bruise/bleed easily.  Psychiatric/Behavioral: Positive for disturbed wake/sleep cycle. Negative for suicidal ideas and dysphoric mood. The patient is not nervous/anxious.        Objective:   Physical Exam  Constitutional: She is oriented to person, place, and time. She appears well-developed and well-nourished. No distress.  HENT:  Head: Normocephalic and atraumatic.  Right Ear: External ear normal.  Left Ear: External ear normal.  Nose: Nose normal.  Mouth/Throat: Oropharynx is clear and moist. No oropharyngeal exudate.    Eyes: Conjunctivae normal are normal. Pupils are equal, round, and reactive to light. Right eye exhibits no discharge. Left eye exhibits no discharge. No scleral icterus.  Neck: Normal range of motion. Neck supple. No tracheal deviation present. No thyromegaly present.  Cardiovascular: Normal rate, regular rhythm, normal heart sounds and intact distal pulses.  Exam reveals no gallop and no friction rub.   No murmur heard. Pulmonary/Chest: Effort normal and breath sounds normal. No respiratory distress. She has no wheezes. She has no rales. She exhibits no tenderness.  Musculoskeletal: Normal range of motion. She exhibits no edema and no tenderness.  Lymphadenopathy:    She has no cervical adenopathy.  Neurological: She is alert and oriented to person, place, and time. No cranial nerve deficit. She exhibits normal muscle tone. Coordination normal.  Skin: Skin is warm and dry. No rash noted. She is not diaphoretic. No erythema. No pallor.  Psychiatric: She has a normal mood and affect. Her behavior is normal. Judgment and thought content normal.          Assessment & Plan:

## 2012-07-15 NOTE — Assessment & Plan Note (Signed)
Symptoms of fatigue slightly improved with increased in Synthroid dose, but still having some daytime somnolence. Recommended that she consider sleep study to evaluate for sleep apnea, however pt would like to hold off for now. Follow up 3 months.

## 2012-07-15 NOTE — Assessment & Plan Note (Signed)
Recent repeat TSH and T4 were normal. Will continue Synthroid at current dose.

## 2012-07-22 ENCOUNTER — Telehealth: Payer: Self-pay | Admitting: Internal Medicine

## 2012-07-22 NOTE — Telephone Encounter (Signed)
Pt was saying that her rx for test strips for her meter was not put in as the correct format ??? Her pharmacy was wanting a certain format. She was wondering if we had any samples of Onglyza.

## 2012-07-25 ENCOUNTER — Telehealth: Payer: Self-pay | Admitting: Internal Medicine

## 2012-07-25 NOTE — Telephone Encounter (Signed)
Please call Diane Mullins from [facility] at [phone]. They are needing form filled out for test strips for medicare to pay for rx that was sent in.

## 2012-07-25 NOTE — Telephone Encounter (Signed)
Form given to Dr. Gilford Rile to complete and sign.

## 2012-08-09 ENCOUNTER — Telehealth: Payer: Self-pay | Admitting: *Deleted

## 2012-08-09 NOTE — Telephone Encounter (Signed)
Returning patients phone call;left message (2x's)

## 2012-11-08 ENCOUNTER — Other Ambulatory Visit: Payer: Self-pay | Admitting: *Deleted

## 2012-11-08 MED ORDER — GLIMEPIRIDE 4 MG PO TABS: 2.0000 mg | ORAL_TABLET | Freq: Two times a day (BID) | ORAL | Status: DC

## 2012-11-08 NOTE — Telephone Encounter (Signed)
Rx sent to pharmacy on file and left detailed message that we do not have any samples of Onglyza

## 2012-12-09 ENCOUNTER — Encounter: Payer: Self-pay | Admitting: Internal Medicine

## 2012-12-09 ENCOUNTER — Ambulatory Visit (INDEPENDENT_AMBULATORY_CARE_PROVIDER_SITE_OTHER): Payer: Medicare Other | Admitting: Internal Medicine

## 2012-12-09 VITALS — BP 140/66 | HR 82 | Temp 98.1°F | Ht 60.5 in | Wt 207.0 lb

## 2012-12-09 DIAGNOSIS — H9193 Unspecified hearing loss, bilateral: Secondary | ICD-10-CM

## 2012-12-09 DIAGNOSIS — R0989 Other specified symptoms and signs involving the circulatory and respiratory systems: Secondary | ICD-10-CM

## 2012-12-09 DIAGNOSIS — Z0000 Encounter for general adult medical examination without abnormal findings: Secondary | ICD-10-CM | POA: Insufficient documentation

## 2012-12-09 DIAGNOSIS — E039 Hypothyroidism, unspecified: Secondary | ICD-10-CM

## 2012-12-09 DIAGNOSIS — R0602 Shortness of breath: Secondary | ICD-10-CM | POA: Insufficient documentation

## 2012-12-09 DIAGNOSIS — E119 Type 2 diabetes mellitus without complications: Secondary | ICD-10-CM

## 2012-12-09 DIAGNOSIS — F411 Generalized anxiety disorder: Secondary | ICD-10-CM

## 2012-12-09 DIAGNOSIS — H9190 Unspecified hearing loss, unspecified ear: Secondary | ICD-10-CM

## 2012-12-09 DIAGNOSIS — I10 Essential (primary) hypertension: Secondary | ICD-10-CM

## 2012-12-09 DIAGNOSIS — E785 Hyperlipidemia, unspecified: Secondary | ICD-10-CM

## 2012-12-09 DIAGNOSIS — K219 Gastro-esophageal reflux disease without esophagitis: Secondary | ICD-10-CM

## 2012-12-09 MED ORDER — SAXAGLIPTIN HCL 5 MG PO TABS: 5.0000 mg | ORAL_TABLET | Freq: Every day | ORAL | Status: DC

## 2012-12-09 MED ORDER — GLIMEPIRIDE 4 MG PO TABS: 2.0000 mg | ORAL_TABLET | Freq: Two times a day (BID) | ORAL | Status: DC

## 2012-12-09 MED ORDER — OMEPRAZOLE 20 MG PO CPDR: 20.0000 mg | DELAYED_RELEASE_CAPSULE | Freq: Every day | ORAL | Status: DC

## 2012-12-09 MED ORDER — LEVOTHYROXINE SODIUM 75 MCG PO TABS: 75.0000 ug | ORAL_TABLET | Freq: Every day | ORAL | Status: DC

## 2012-12-09 MED ORDER — AMLODIPINE BESYLATE 5 MG PO TABS: 5.0000 mg | ORAL_TABLET | Freq: Every day | ORAL | Status: DC

## 2012-12-09 MED ORDER — ALPRAZOLAM 0.25 MG PO TABS: 0.2500 mg | ORAL_TABLET | Freq: Three times a day (TID) | ORAL | Status: DC | PRN

## 2012-12-09 MED ORDER — LOSARTAN POTASSIUM-HCTZ 50-12.5 MG PO TABS
1.0000 | ORAL_TABLET | Freq: Every day | ORAL | Status: DC
Start: 2012-12-09 — End: 2013-10-30

## 2012-12-09 MED ORDER — SIMVASTATIN 40 MG PO TABS: 40.0000 mg | ORAL_TABLET | Freq: Every evening | ORAL | Status: DC

## 2012-12-09 MED ORDER — METFORMIN HCL 1000 MG PO TABS: 1000.0000 mg | ORAL_TABLET | Freq: Two times a day (BID) | ORAL | Status: DC

## 2012-12-09 MED ORDER — FUROSEMIDE 20 MG PO TABS: 10.0000 mg | ORAL_TABLET | Freq: Every day | ORAL | Status: DC

## 2012-12-09 NOTE — Assessment & Plan Note (Signed)
Will set up audiology evaluation.

## 2012-12-09 NOTE — Assessment & Plan Note (Signed)
Symptoms are concerning for anginal equivalent. EKG shows some nonspecific changes today. Will set up cardiology evaluation. Question if she might benefit from additional evaluation including stress test.

## 2012-12-09 NOTE — Assessment & Plan Note (Signed)
Will check A1c with labs today. Continue current medications. 

## 2012-12-09 NOTE — Assessment & Plan Note (Signed)
General medical exam including breast exam normal today. Pap and pelvic deferred given patient's age and previous Paps normal. Screening remarkable for hearing loss. Will set up audiology evaluation. Given recent symptoms of exertional dyspnea and palpitations, will set up cardiology evaluation as described below. Will check basic labs today including CBC, CMP, TSH, lipid profile. Will also check A1c given patient's history of diabetes. Followup 4 weeks.

## 2012-12-09 NOTE — Progress Notes (Signed)
Subjective:    Patient ID: Diane Mullins, female    DOB: 05/13/1933, 79 y.o.   MRN: [id number]  HPI The patient is here for annual Medicare wellness examination and management of other chronic and acute problems.   The risk factors are reflected in the social history.  The roster of all physicians providing medical care to patient - is listed in the Snapshot section of the chart.  Activities of daily living:  The patient is 100% independent in all ADLs: dressing, toileting, feeding as well as independent mobility  Home safety : The patient has smoke detectors in the home. They wear seatbelts.  There are no firearms at home. There is no violence in the home.   There is no risks for hepatitis, STDs or HIV. There is no  history of blood transfusion. They have no travel history to infectious disease endemic areas of the world.  The patient has seen their dentist in the last six month. (Dr. Eugenie Birks) They have seen their eye doctor in the last year. (Dr. Dawna Part) No recent hearing check. They have deferred audiologic testing in the last year.  Used hearing aid in past and returned them.  They do not  have excessive sun exposure. Discussed the need for sun protection: hats, long sleeves and use of sunscreen if there is significant sun exposure.   Diet: the importance of a healthy diet is discussed. They do have a healthy diet.  The benefits of regular aerobic exercise were discussed. Exercise limited, walks occasionally and takes a balance class.  Depression screen: there are no signs or vegative symptoms of depression- irritability, change in appetite, anhedonia, sadness/tearfullness.  Cognitive assessment: the patient manages all their financial and personal affairs and is actively engaged. They could relate day,date,year and events.  The following portions of the patient's history were reviewed and updated as appropriate: allergies, current medications, past family history, past medical  history,  past surgical history, past social history  and problem list.  Visual acuity was not assessed per patient preference since she has regular follow up with her ophthalmologist. Hearing and body mass index were assessed and reviewed.   During the course of the visit the patient was educated and counseled about appropriate screening and preventive services including : fall prevention , diabetes screening, nutrition counseling, colorectal cancer screening, and recommended immunizations.    Patient is also concerned today because of shortness of breath with minimal exertion. She reports that during activity such as walking to her mailbox, she developed shortness of breath and palpitations after walking approximately 25 yards. She denies chest pain during these episodes. Symptoms resolved with rest. She has not had any recent cardiac evaluation or stress test. She does not have any known history of coronary artery disease.   Outpatient Encounter Prescriptions as of 12/09/2012  Medication Sig Dispense Refill  . ALPRAZolam (XANAX) 0.25 MG tablet Take 1 tablet (0.25 mg total) by mouth 3 (three) times daily as needed for sleep or anxiety.  90 tablet  3  . amLODipine (NORVASC) 5 MG tablet Take 1 tablet (5 mg total) by mouth daily.  90 tablet  3  . aspirin EC 81 MG tablet Take 81 mg by mouth daily.      . calcium-vitamin D (OSCAL) 250-125 MG-UNIT per tablet Take 1 tablet by mouth daily.      . fish oil-omega-3 fatty acids 1000 MG capsule Take 2 g by mouth daily.      . furosemide (LASIX) 20 MG  tablet Take 0.5-1 tablets (10-20 mg total) by mouth daily.  90 tablet  3  . glimepiride (AMARYL) 4 MG tablet Take 0.5 tablets (2 mg total) by mouth 2 (two) times daily.  90 tablet  3  . glucose blood test strip Use as instructed  100 each  12  . levothyroxine (SYNTHROID, LEVOTHROID) 75 MCG tablet Take 1 tablet (75 mcg total) by mouth daily.  90 tablet  3  . losartan-hydrochlorothiazide (HYZAAR) 50-12.5 MG per  tablet Take 1 tablet by mouth daily.  90 tablet  3  . metFORMIN (GLUCOPHAGE) 1000 MG tablet Take 1 tablet (1,000 mg total) by mouth 2 (two) times daily with a meal.  180 tablet  3  . Multiple Vitamins-Minerals (MULTIVITAMIN WITH MINERALS) tablet Take 1 tablet by mouth daily.      Marland Kitchen omeprazole (PRILOSEC) 20 MG capsule Take 1 capsule (20 mg total) by mouth daily.  90 capsule  3  . saxagliptin HCl (ONGLYZA) 5 MG TABS tablet Take 1 tablet (5 mg total) by mouth daily.  90 tablet  3  . simvastatin (ZOCOR) 40 MG tablet Take 1 tablet (40 mg total) by mouth every evening.  90 tablet  3   No facility-administered encounter medications on file as of 12/09/2012.   BP 140/66  Pulse 82  Temp(Src) 98.1 F (36.7 C) (Oral)  Ht 5' 0.5" (1.537 m)  Wt 207 lb (93.895 kg)  BMI 39.75 kg/m2  SpO2 95%  Review of Systems  Constitutional: Negative for fever, chills, appetite change, fatigue and unexpected weight change.  HENT: Negative for ear pain, congestion, sore throat, trouble swallowing, neck pain, voice change and sinus pressure.   Eyes: Negative for visual disturbance.  Respiratory: Positive for shortness of breath. Negative for cough, wheezing and stridor.   Cardiovascular: Positive for palpitations. Negative for chest pain and leg swelling.  Gastrointestinal: Negative for nausea, vomiting, abdominal pain, diarrhea, constipation, blood in stool, abdominal distention and anal bleeding.  Genitourinary: Negative for dysuria and flank pain.  Musculoskeletal: Negative for myalgias, arthralgias and gait problem.  Skin: Negative for color change and rash.  Neurological: Negative for dizziness and headaches.  Hematological: Negative for adenopathy. Does not bruise/bleed easily.  Psychiatric/Behavioral: Negative for suicidal ideas, sleep disturbance and dysphoric mood. The patient is not nervous/anxious.        Objective:   Physical Exam  Constitutional: She is oriented to person, place, and time. She  appears well-developed and well-nourished. No distress.  HENT:  Head: Normocephalic and atraumatic.  Right Ear: External ear normal. Decreased hearing is noted.  Left Ear: External ear normal. Decreased hearing is noted.  Nose: Nose normal.  Mouth/Throat: Oropharynx is clear and moist. No oropharyngeal exudate.  Eyes: Conjunctivae are normal. Pupils are equal, round, and reactive to light. Right eye exhibits no discharge. Left eye exhibits no discharge. No scleral icterus.  Neck: Normal range of motion. Neck supple. No tracheal deviation present. No thyromegaly present.  Cardiovascular: Normal rate, regular rhythm, normal heart sounds and intact distal pulses.  Exam reveals no gallop and no friction rub.   No murmur heard. Pulmonary/Chest: Effort normal and breath sounds normal. No accessory muscle usage. Not tachypneic. No respiratory distress. She has no decreased breath sounds. She has no wheezes. She has no rhonchi. She has no rales. She exhibits no tenderness. Right breast exhibits no inverted nipple, no mass, no nipple discharge, no skin change and no tenderness. Left breast exhibits no inverted nipple, no mass, no nipple discharge, no skin  change and no tenderness. Breasts are symmetrical.  Abdominal: Soft. Bowel sounds are normal. She exhibits no distension. There is no tenderness. There is no rebound.  Musculoskeletal: Normal range of motion. She exhibits no edema and no tenderness.  Lymphadenopathy:    She has no cervical adenopathy.  Neurological: She is alert and oriented to person, place, and time. No cranial nerve deficit. She exhibits normal muscle tone. Coordination normal.  Skin: Skin is warm and dry. No rash noted. She is not diaphoretic. No erythema. No pallor.  Psychiatric: She has a normal mood and affect. Her behavior is normal. Judgment and thought content normal.          Assessment & Plan:

## 2012-12-11 ENCOUNTER — Telehealth: Payer: Self-pay | Admitting: Internal Medicine

## 2012-12-11 NOTE — Telephone Encounter (Signed)
Left message to call back  

## 2012-12-11 NOTE — Telephone Encounter (Signed)
Patient informed and verbally agreed understanding. She will work on it and it has been a long hard winter.

## 2012-12-11 NOTE — Telephone Encounter (Signed)
Labs show that blood counts are normal. Kidney and liver function are normal. Cholesterol is normal. Hemoglobin A1c is markedly elevated at 10%. I would like for patient to check blood sugars twice daily and return to the office in 2-4 weeks with record of blood sugar readings. We may need to change diabetes medications to better control blood sugar.

## 2012-12-24 ENCOUNTER — Encounter: Payer: Self-pay | Admitting: Internal Medicine

## 2013-01-02 ENCOUNTER — Ambulatory Visit: Payer: Medicare Other | Admitting: Cardiovascular Disease

## 2013-01-13 ENCOUNTER — Telehealth: Payer: Self-pay | Admitting: Internal Medicine

## 2013-01-13 NOTE — Telephone Encounter (Signed)
Orders are on your desk

## 2013-01-13 NOTE — Telephone Encounter (Signed)
Left message to call back  

## 2013-01-13 NOTE — Telephone Encounter (Signed)
Patient wanting an order to have labs done at [facility] before her appointment on 4.30.15

## 2013-01-14 ENCOUNTER — Other Ambulatory Visit: Payer: Self-pay | Admitting: Internal Medicine

## 2013-01-14 NOTE — Telephone Encounter (Signed)
Patient came by and picked up this morning.

## 2013-01-15 ENCOUNTER — Encounter: Payer: Self-pay | Admitting: Internal Medicine

## 2013-01-15 ENCOUNTER — Ambulatory Visit (INDEPENDENT_AMBULATORY_CARE_PROVIDER_SITE_OTHER): Payer: Medicare Other | Admitting: Internal Medicine

## 2013-01-15 VITALS — BP 138/70 | HR 77 | Temp 97.7°F | Wt 206.8 lb

## 2013-01-15 DIAGNOSIS — F419 Anxiety disorder, unspecified: Secondary | ICD-10-CM

## 2013-01-15 DIAGNOSIS — E119 Type 2 diabetes mellitus without complications: Secondary | ICD-10-CM

## 2013-01-15 DIAGNOSIS — R0989 Other specified symptoms and signs involving the circulatory and respiratory systems: Secondary | ICD-10-CM

## 2013-01-15 DIAGNOSIS — I10 Essential (primary) hypertension: Secondary | ICD-10-CM

## 2013-01-15 DIAGNOSIS — E785 Hyperlipidemia, unspecified: Secondary | ICD-10-CM

## 2013-01-15 DIAGNOSIS — F411 Generalized anxiety disorder: Secondary | ICD-10-CM

## 2013-01-15 LAB — COMPREHENSIVE METABOLIC PANEL
Albumin/Globulin Ratio: 1.6 (ref 1.1–2.5)
Albumin: 4.1 g/dL (ref 3.5–4.8)
Alkaline Phosphatase: 104 IU/L (ref 39–117)
BUN/Creatinine Ratio: 12 (ref 11–26)
BUN: 12 mg/dL (ref 8–27)
CO2: 26 mmol/L (ref 19–28)
Creatinine, Ser: 0.97 mg/dL (ref 0.57–1.00)
GFR calc Af Amer: 65 mL/min/{1.73_m2} (ref >59)
GFR calc non Af Amer: 56 mL/min/{1.73_m2} — ABNORMAL LOW (ref >59)
Globulin, Total: 2.5 g/dL (ref 1.5–4.5)
Total Bilirubin: 0.3 mg/dL (ref 0.0–1.2)

## 2013-01-15 LAB — HGB A1C W/O EAG: Hgb A1c MFr Bld: 9.8 % — ABNORMAL HIGH (ref 4.8–5.6)

## 2013-01-15 MED ORDER — ALPRAZOLAM 0.25 MG PO TABS: 0.2500 mg | ORAL_TABLET | Freq: Three times a day (TID) | ORAL | Status: DC | PRN

## 2013-01-15 MED ORDER — SIMVASTATIN 20 MG PO TABS: 20.0000 mg | ORAL_TABLET | Freq: Every evening | ORAL | Status: DC

## 2013-01-15 NOTE — Assessment & Plan Note (Signed)
Symptoms of exertional fatigue and mild dyspnea persistent. As per previous notes, question if this may be anginal equivalent. Cardiac evaluation is pending.

## 2013-01-15 NOTE — Progress Notes (Signed)
Subjective:    Patient ID: Diane Mullins, female    DOB: 1933-07-21, 79 y.o.   MRN: [id number]  HPI 79 year old female with history of diabetes, hypertension, hyperlipidemia presents for followup. At her last visit, she complained of exertional fatigue and dyspnea. She denies chest pain. Symptoms have been relatively stable. Cardiac evaluation is pending and is scheduled for next week.  In regards to diabetes, she reports blood sugars are typically near 150. Last A1c 3 months ago was 10%. Repeat A1c was performed yesterday and is pending. She is compliant with her medications.  Outpatient Encounter Prescriptions as of 01/15/2013  Medication Sig Dispense Refill  . ALPRAZolam (XANAX) 0.25 MG tablet Take 1 tablet (0.25 mg total) by mouth 3 (three) times daily as needed for sleep or anxiety.  90 tablet  3  . amLODipine (NORVASC) 5 MG tablet Take 1 tablet (5 mg total) by mouth daily.  90 tablet  3  . aspirin EC 81 MG tablet Take 81 mg by mouth daily.      . calcium-vitamin D (OSCAL) 250-125 MG-UNIT per tablet Take 1 tablet by mouth daily.      . fish oil-omega-3 fatty acids 1000 MG capsule Take 2 g by mouth daily.      . furosemide (LASIX) 20 MG tablet Take 0.5-1 tablets (10-20 mg total) by mouth daily.  90 tablet  3  . glimepiride (AMARYL) 4 MG tablet Take 0.5 tablets (2 mg total) by mouth 2 (two) times daily.  90 tablet  3  . glucose blood test strip Use as instructed  100 each  12  . levothyroxine (SYNTHROID, LEVOTHROID) 75 MCG tablet Take 1 tablet (75 mcg total) by mouth daily.  90 tablet  3  . losartan-hydrochlorothiazide (HYZAAR) 50-12.5 MG per tablet Take 1 tablet by mouth daily.  90 tablet  3  . metFORMIN (GLUCOPHAGE) 1000 MG tablet Take 1 tablet (1,000 mg total) by mouth 2 (two) times daily with a meal.  180 tablet  3  . Multiple Vitamins-Minerals (MULTIVITAMIN WITH MINERALS) tablet Take 1 tablet by mouth daily.      Marland Kitchen omeprazole (PRILOSEC) 20 MG capsule Take 1 capsule (20 mg total) by mouth  daily.  90 capsule  3  . saxagliptin HCl (ONGLYZA) 5 MG TABS tablet Take 1 tablet (5 mg total) by mouth daily.  90 tablet  3  . simvastatin (ZOCOR) 20 MG tablet Take 1 tablet (20 mg total) by mouth every evening.  90 tablet  3  . [DISCONTINUED] ALPRAZolam (XANAX) 0.25 MG tablet Take 1 tablet (0.25 mg total) by mouth 3 (three) times daily as needed for sleep or anxiety.  90 tablet  3  . [DISCONTINUED] simvastatin (ZOCOR) 40 MG tablet Take 1 tablet (40 mg total) by mouth every evening.  90 tablet  3   No facility-administered encounter medications on file as of 01/15/2013.   BP 138/70  Pulse 77  Temp(Src) 97.7 F (36.5 C) (Oral)  Wt 206 lb 12 oz (93.781 kg)  BMI 39.7 kg/m2  SpO2 97%  Review of Systems  Constitutional: Positive for fatigue. Negative for fever, chills, appetite change and unexpected weight change.  HENT: Negative for ear pain, congestion, sore throat, trouble swallowing, neck pain, voice change and sinus pressure.   Eyes: Negative for visual disturbance.  Respiratory: Positive for shortness of breath. Negative for cough, wheezing and stridor.   Cardiovascular: Negative for chest pain, palpitations and leg swelling.  Gastrointestinal: Negative for nausea, vomiting, abdominal pain, diarrhea, constipation,  blood in stool, abdominal distention and anal bleeding.  Genitourinary: Negative for dysuria and flank pain.  Musculoskeletal: Negative for myalgias, arthralgias and gait problem.  Skin: Negative for color change and rash.  Neurological: Negative for dizziness and headaches.  Hematological: Negative for adenopathy. Does not bruise/bleed easily.  Psychiatric/Behavioral: Negative for suicidal ideas, sleep disturbance and dysphoric mood. The patient is not nervous/anxious.        Objective:   Physical Exam  Constitutional: She is oriented to person, place, and time. She appears well-developed and well-nourished. No distress.  HENT:  Head: Normocephalic and atraumatic.   Right Ear: External ear normal.  Left Ear: External ear normal.  Nose: Nose normal.  Mouth/Throat: Oropharynx is clear and moist. No oropharyngeal exudate.  Eyes: Conjunctivae are normal. Pupils are equal, round, and reactive to light. Right eye exhibits no discharge. Left eye exhibits no discharge. No scleral icterus.  Neck: Normal range of motion. Neck supple. No tracheal deviation present. No thyromegaly present.  Cardiovascular: Normal rate, regular rhythm, normal heart sounds and intact distal pulses.  Exam reveals no gallop and no friction rub.   No murmur heard. Pulmonary/Chest: Effort normal and breath sounds normal. No accessory muscle usage. Not tachypneic. No respiratory distress. She has no decreased breath sounds. She has no wheezes. She has no rhonchi. She has no rales. She exhibits no tenderness.  Musculoskeletal: Normal range of motion. She exhibits no edema and no tenderness.  Lymphadenopathy:    She has no cervical adenopathy.  Neurological: She is alert and oriented to person, place, and time. No cranial nerve deficit. She exhibits normal muscle tone. Coordination normal.  Skin: Skin is warm and dry. No rash noted. She is not diaphoretic. No erythema. No pallor.  Psychiatric: She has a normal mood and affect. Her behavior is normal. Judgment and thought content normal.          Assessment & Plan:

## 2013-01-15 NOTE — Assessment & Plan Note (Signed)
BP Readings from Last 3 Encounters:  01/15/13 138/70  12/09/12 140/66  07/15/12 120/60   BP well controlled on current medications. Will continue.

## 2013-01-15 NOTE — Assessment & Plan Note (Signed)
Blood sugars generally well controlled on current medications. A1c was completed yesterday and is pending. Follow up 03/2013 and prn.

## 2013-01-15 NOTE — Assessment & Plan Note (Signed)
Symptoms well controlled with alprazolam. We'll continue.

## 2013-01-16 ENCOUNTER — Telehealth: Payer: Self-pay | Admitting: *Deleted

## 2013-01-16 DIAGNOSIS — E119 Type 2 diabetes mellitus without complications: Secondary | ICD-10-CM

## 2013-01-16 MED ORDER — GLIMEPIRIDE 4 MG PO TABS: 2.0000 mg | ORAL_TABLET | Freq: Two times a day (BID) | ORAL | Status: DC

## 2013-01-16 NOTE — Telephone Encounter (Signed)
Spoke with patient and she stated she is taking the Glimipiride as instructed 2 mg in the morning and 2 mg in the evening. Per lab note as listed below then she would need to increase it. Patient agreed and stated she need a prescription sent to pharmacy on file.

## 2013-01-16 NOTE — Telephone Encounter (Signed)
Message copied by Ronaldo Miyamoto on Thu Jan 16, 2013  9:25 AM ------      Message from: Ronette Deter A      Created: Wed Jan 15, 2013  2:54 PM       Labs show that blood sugars are more elevated. Has pt been taking Glimepiride 2mg  twice daily?  If yes, we may need to increase to 4mg  in the morning and 2mg  at night. ------

## 2013-01-21 ENCOUNTER — Telehealth: Payer: Self-pay | Admitting: *Deleted

## 2013-01-21 NOTE — Telephone Encounter (Signed)
Patient left voicemail stating she need a refill on Glimipiride but this was done on 4/1. Requesting 30 day supply sent to [facility] on [address] in [place].

## 2013-01-22 NOTE — Telephone Encounter (Signed)
Left message to call back, on the voicemail she stated he medication was changed to one and a half tablets daily. Need clarification before sending in prescription.

## 2013-01-23 ENCOUNTER — Encounter: Payer: Self-pay | Admitting: Cardiovascular Disease

## 2013-01-23 ENCOUNTER — Ambulatory Visit (INDEPENDENT_AMBULATORY_CARE_PROVIDER_SITE_OTHER): Payer: Medicare Other | Admitting: Cardiovascular Disease

## 2013-01-23 VITALS — BP 118/74 | HR 101 | Ht 61.0 in | Wt 184.2 lb

## 2013-01-23 DIAGNOSIS — I10 Essential (primary) hypertension: Secondary | ICD-10-CM

## 2013-01-23 DIAGNOSIS — E119 Type 2 diabetes mellitus without complications: Secondary | ICD-10-CM

## 2013-01-23 DIAGNOSIS — R5381 Other malaise: Secondary | ICD-10-CM

## 2013-01-23 DIAGNOSIS — R0609 Other forms of dyspnea: Secondary | ICD-10-CM

## 2013-01-23 DIAGNOSIS — R0602 Shortness of breath: Secondary | ICD-10-CM

## 2013-01-23 DIAGNOSIS — E785 Hyperlipidemia, unspecified: Secondary | ICD-10-CM

## 2013-01-23 DIAGNOSIS — R5383 Other fatigue: Secondary | ICD-10-CM

## 2013-01-23 DIAGNOSIS — R000 Tachycardia, unspecified: Secondary | ICD-10-CM

## 2013-01-23 MED ORDER — METOPROLOL SUCCINATE ER 25 MG PO TB24: 25.0000 mg | ORAL_TABLET | Freq: Every day | ORAL | Status: DC

## 2013-01-23 NOTE — Assessment & Plan Note (Signed)
We have encouraged continued exercise, careful diet management in an effort to lose weight. 

## 2013-01-23 NOTE — Patient Instructions (Addendum)
You are doing well.  Please start metoprolol succinate 25 mg one a day Please hold the amlodipine  Please call us if you have new issues that need to be addressed before your next appt.  Your physician wants you to follow-up in: 3 - 4 weeks.

## 2013-01-23 NOTE — Assessment & Plan Note (Signed)
She had baseline sinus tachycardia on EKG, worse with exerting herself to get up onto the exam table. Even after recovering, had elevated heart rate. Shortness of breath with exertion. I suspect her obesity and significant deconditioning is contributing to her tachycardia and decreased mobility. We have discussed options with her. I suggested we first tried to work on her heart rate. We will hold her amlodipine, start metoprolol succinate 25 mg daily. I suspect she will need 50 mg daily though will wait several weeks before advancing the medication. We'll see her back in several weeks' time. On clinical exam, no apparent murmurs but heart rate was very fast. In followup, she may need echocardiogram if no improvement of her shortness of breath with improved heart rate.

## 2013-01-23 NOTE — Assessment & Plan Note (Signed)
I'm concerned that her weight and deconditioning is contributing to her symptoms. We will work on her heart rate as this seems excessively fast and likely will improve with beta blockers. We'll advance her beta blocker slowly as tolerated for better heart rate control with exertion. Consider echocardiogram in followup.

## 2013-01-23 NOTE — Assessment & Plan Note (Signed)
Underlying fatigue possibly from deconditioning. She may benefit from physical therapy given her significant leg weakness.

## 2013-01-23 NOTE — Progress Notes (Signed)
Patient ID: LIV FOOS, female    DOB: 06-Mar-1933, 79 y.o.   MRN: [id number]  HPI Comments: 79 year old female with history of diabetes, hypertension, hyperlipidemia,  patient of Dr. walker, who presents by referral for tachycardia .  She reports that she has difficulty walking at baseline. She does not exercise or do any regular activities. She is relatively sedentary. With exertion, she has frequent episodes of fluttering and a fast heart rate. This typically will cause shortness of breath with exertion. She will have this once or twice a day with exertion. No significant symptoms at rest. Symptoms have been consistent/stable for quite some time.  She continues to battle with her weight. Sugars have been a challenge. She is trying to watch her diet. Would like to stay away from insulin. Notes indicate Last A1c 3 months ago was 10%.   EKG shows sinus tachycardia with rate 101 beats per minute, nonspecific ST and T wave abnormality   Outpatient Encounter Prescriptions as of 01/23/2013  Medication Sig Dispense Refill  . ALPRAZolam (XANAX) 0.25 MG tablet Take 1 tablet (0.25 mg total) by mouth 3 (three) times daily as needed for sleep or anxiety.  90 tablet  3  . amLODipine (NORVASC) 5 MG tablet Take 1 tablet (5 mg total) by mouth daily.  90 tablet  3  . aspirin EC 81 MG tablet Take 81 mg by mouth daily.      . calcium-vitamin D (OSCAL) 250-125 MG-UNIT per tablet Take 1 tablet by mouth daily.      . fish oil-omega-3 fatty acids 1000 MG capsule Take 2 g by mouth daily.      . furosemide (LASIX) 20 MG tablet Take 20 mg by mouth daily.      Marland Kitchen glimepiride (AMARYL) 4 MG tablet Takes 1 tablet am and 1/2 tablet pm daily.      Marland Kitchen glucose blood test strip Use as instructed  100 each  12  . levothyroxine (SYNTHROID, LEVOTHROID) 75 MCG tablet Take 1 tablet (75 mcg total) by mouth daily.  90 tablet  3  . losartan-hydrochlorothiazide (HYZAAR) 50-12.5 MG per tablet Take 1 tablet by mouth daily.  90 tablet  3   . metFORMIN (GLUCOPHAGE) 1000 MG tablet Take 1 tablet (1,000 mg total) by mouth 2 (two) times daily with a meal.  180 tablet  3  . Multiple Vitamins-Minerals (MULTIVITAMIN WITH MINERALS) tablet Take 1 tablet by mouth daily.      Marland Kitchen omeprazole (PRILOSEC) 20 MG capsule Take 1 capsule (20 mg total) by mouth daily.  90 capsule  3  . saxagliptin HCl (ONGLYZA) 5 MG TABS tablet Take 1 tablet (5 mg total) by mouth daily.  90 tablet  3  . simvastatin (ZOCOR) 20 MG tablet Take 1 tablet (20 mg total) by mouth every evening.  90 tablet  3   Review of Systems  Constitutional: Negative.   HENT: Negative.   Eyes: Negative.   Respiratory: Positive for shortness of breath.   Cardiovascular: Positive for palpitations.  Gastrointestinal: Negative.   Musculoskeletal: Negative.   Skin: Negative.   Neurological: Negative.   Psychiatric/Behavioral: Negative.   All other systems reviewed and are negative.    BP 118/74  Pulse 101  Ht 5\' 1"  (1.549 m)  Wt 184 lb 4 oz (83.575 kg)  BMI 34.83 kg/m2  Physical Exam  Nursing note and vitals reviewed. Constitutional: She is oriented to person, place, and time. She appears well-developed and well-nourished.  Obese  HENT:  Head:  Normocephalic.  Nose: Nose normal.  Mouth/Throat: Oropharynx is clear and moist.  Eyes: Conjunctivae are normal. Pupils are equal, round, and reactive to light.  Neck: Normal range of motion. Neck supple. No JVD present.  Cardiovascular: Regular rhythm, S1 normal, S2 normal, normal heart sounds and intact distal pulses.  Tachycardia present.  Exam reveals no gallop and no friction rub.   No murmur heard. Pulmonary/Chest: Effort normal and breath sounds normal. No respiratory distress. She has no wheezes. She has no rales. She exhibits no tenderness.  Abdominal: Soft. Bowel sounds are normal. She exhibits no distension. There is no tenderness.  Musculoskeletal: Normal range of motion. She exhibits no edema and no tenderness.   Lymphadenopathy:    She has no cervical adenopathy.  Neurological: She is alert and oriented to person, place, and time. Coordination normal.  Skin: Skin is warm and dry. No rash noted. No erythema.  Psychiatric: She has a normal mood and affect. Her behavior is normal. Judgment and thought content normal.    Assessment and Plan

## 2013-01-23 NOTE — Assessment & Plan Note (Signed)
We will hold her amlodipine, start metoprolol succinate 25 mg daily. If this is expensive for her, could change to metoprolol tartrate.

## 2013-01-23 NOTE — Assessment & Plan Note (Signed)
We have suggested she stay on her simvastatin

## 2013-01-24 MED ORDER — GLIMEPIRIDE 4 MG PO TABS: 4.0000 mg | ORAL_TABLET | Freq: Two times a day (BID) | ORAL | Status: DC

## 2013-01-24 NOTE — Telephone Encounter (Signed)
Per patient, she is taking 1 whole tablet in the morning and 1/2 tablet at night. [facility] Done.

## 2013-02-19 ENCOUNTER — Ambulatory Visit (INDEPENDENT_AMBULATORY_CARE_PROVIDER_SITE_OTHER): Payer: Medicare Other | Admitting: Cardiovascular Disease

## 2013-02-19 ENCOUNTER — Encounter: Payer: Self-pay | Admitting: Cardiovascular Disease

## 2013-02-19 VITALS — BP 140/70 | HR 109 | Ht 61.0 in | Wt 205.5 lb

## 2013-02-19 DIAGNOSIS — R0789 Other chest pain: Secondary | ICD-10-CM

## 2013-02-19 DIAGNOSIS — E785 Hyperlipidemia, unspecified: Secondary | ICD-10-CM

## 2013-02-19 DIAGNOSIS — E119 Type 2 diabetes mellitus without complications: Secondary | ICD-10-CM

## 2013-02-19 DIAGNOSIS — R0609 Other forms of dyspnea: Secondary | ICD-10-CM

## 2013-02-19 DIAGNOSIS — R000 Tachycardia, unspecified: Secondary | ICD-10-CM

## 2013-02-19 DIAGNOSIS — I10 Essential (primary) hypertension: Secondary | ICD-10-CM

## 2013-02-19 MED ORDER — METOPROLOL SUCCINATE ER 50 MG PO TB24: 50.0000 mg | ORAL_TABLET | Freq: Two times a day (BID) | ORAL | Status: DC

## 2013-02-19 NOTE — Assessment & Plan Note (Signed)
We have encouraged continued exercise, careful diet management in an effort to lose weight. 

## 2013-02-19 NOTE — Progress Notes (Signed)
Patient ID: Diane Mullins, female    DOB: Oct 02, 1932, 79 y.o.   MRN: [id number]  HPI Comments: 79 year old female with history of diabetes, hypertension, hyperlipidemia,  patient of Dr. walker, history of tachycardia who presents for routine followup  She feels that she is slightly better on metoprolol succinate 25 mg daily. She held her amlodipine. Blood pressure has been relatively stable. She spent some time at the beach. Did not do very much walking secondary to shortness of breath. She does report having one episode where she was reading a exciting book, felt anxious, tightness in her chest with tachycardia. Symptoms resolved eventually without intervention. Wonders if it was a panic attack  She continues to battle with her weight. Sugars have been a challenge. She is trying to watch her diet. Would like to stay away from insulin. Notes indicate Last A1c was 10%.   EKG shows sinus tachycardia with rate 109 beats per minute, nonspecific ST and T wave abnormality   Outpatient Encounter Prescriptions as of 02/19/2013  Medication Sig Dispense Refill  . ALPRAZolam (XANAX) 0.25 MG tablet Take 1 tablet (0.25 mg total) by mouth 3 (three) times daily as needed for sleep or anxiety.  90 tablet  3  . aspirin EC 81 MG tablet Take 81 mg by mouth daily.      . calcium-vitamin D (OSCAL) 250-125 MG-UNIT per tablet Take 1 tablet by mouth daily.      . fish oil-omega-3 fatty acids 1000 MG capsule Take 2 g by mouth daily.      . furosemide (LASIX) 20 MG tablet Take 20 mg by mouth daily.      Marland Kitchen glimepiride (AMARYL) 4 MG tablet Takes 1 tablet am and 1/2 tablet pm daily.      Marland Kitchen glucose blood test strip Use as instructed  100 each  12  . levothyroxine (SYNTHROID, LEVOTHROID) 75 MCG tablet Take 1 tablet (75 mcg total) by mouth daily.  90 tablet  3  . losartan-hydrochlorothiazide (HYZAAR) 50-12.5 MG per tablet Take 1 tablet by mouth daily.  90 tablet  3  . metFORMIN (GLUCOPHAGE) 1000 MG tablet Take 1 tablet (1,000  mg total) by mouth 2 (two) times daily with a meal.  180 tablet  3  . Multiple Vitamins-Minerals (MULTIVITAMIN WITH MINERALS) tablet Take 1 tablet by mouth daily.      Marland Kitchen omeprazole (PRILOSEC) 20 MG capsule Take 1 capsule (20 mg total) by mouth daily.  90 capsule  3  . saxagliptin HCl (ONGLYZA) 5 MG TABS tablet Take 1 tablet (5 mg total) by mouth daily.  90 tablet  3  . simvastatin (ZOCOR) 20 MG tablet Take 1 tablet (20 mg total) by mouth every evening.  90 tablet  3  . metoprolol succinate (TOPROL-XL) 25 MG 24 hr tablet Take 1 tablet (25 mg total) by mouth daily.  30 tablet  11    Review of Systems  Constitutional: Negative.   HENT: Negative.   Eyes: Negative.   Respiratory: Positive for shortness of breath.   Cardiovascular: Positive for palpitations.  Gastrointestinal: Negative.   Musculoskeletal: Negative.   Skin: Negative.   Neurological: Negative.   Psychiatric/Behavioral: Negative.   All other systems reviewed and are negative.    BP 140/70  Pulse 109  Ht 5\' 1"  (1.549 m)  Wt 205 lb 8 oz (93.214 kg)  BMI 38.85 kg/m2  Physical Exam  Nursing note and vitals reviewed. Constitutional: She is oriented to person, place, and time. She appears well-developed  and well-nourished.  Obese  HENT:  Head: Normocephalic.  Nose: Nose normal.  Mouth/Throat: Oropharynx is clear and moist.  Eyes: Conjunctivae are normal. Pupils are equal, round, and reactive to light.  Neck: Normal range of motion. Neck supple. No JVD present.  Cardiovascular: Regular rhythm, S1 normal, S2 normal, normal heart sounds and intact distal pulses.  Tachycardia present.  Exam reveals no gallop and no friction rub.   No murmur heard. Pulmonary/Chest: Effort normal and breath sounds normal. No respiratory distress. She has no wheezes. She has no rales. She exhibits no tenderness.  Abdominal: Soft. Bowel sounds are normal. She exhibits no distension. There is no tenderness.  Musculoskeletal: Normal range of  motion. She exhibits no edema and no tenderness.  Lymphadenopathy:    She has no cervical adenopathy.  Neurological: She is alert and oriented to person, place, and time. Coordination normal.  Skin: Skin is warm and dry. No rash noted. No erythema.  Psychiatric: She has a normal mood and affect. Her behavior is normal. Judgment and thought content normal.    Assessment and Plan

## 2013-02-19 NOTE — Assessment & Plan Note (Signed)
Encouraged her to stay on her simvastatin 

## 2013-02-19 NOTE — Assessment & Plan Note (Signed)
Blood pressure reasonably well controlled. If blood pressure does drop with high-dose metoprolol, could decrease the dose of her losartan HCT

## 2013-02-19 NOTE — Assessment & Plan Note (Signed)
We have suggested she increase her metoprolol succinate to 50 mg daily. Monitor her blood pressure and heart rate for the next week. If heart rate continues to be elevated and blood pressure stable, with increased metoprolol succinate to 50 mg twice a day. If blood pressure is low, would decrease her losartan HCT dose.

## 2013-02-19 NOTE — Patient Instructions (Addendum)
Please increase the metoprolol to 50 mg once a day Monitor your heart rate If it continues to run fast, call the office We would increase the metoprolol to 50 mg twice a day  Please call us if you have new issues that need to be addressed before your next appt.  Your physician wants you to follow-up in: 3 months.  You will receive a reminder letter in the mail two months in advance. If you don't receive a letter, please call our office to schedule the follow-up appointment.

## 2013-02-19 NOTE — Assessment & Plan Note (Signed)
Suspect that her tachycardia, weight and deconditioning are contributing to her symptoms. We will continue to work on her heart rate as this seems excessively fast and likely will improve with beta blockers. Consider echocardiogram in followup if no improvement in symptoms.

## 2013-02-28 ENCOUNTER — Other Ambulatory Visit: Payer: Self-pay | Admitting: Internal Medicine

## 2013-02-28 MED ORDER — GLIMEPIRIDE 4 MG PO TABS: ORAL_TABLET | ORAL | Status: DC

## 2013-03-05 ENCOUNTER — Telehealth: Payer: Self-pay

## 2013-03-05 NOTE — Telephone Encounter (Signed)
Given patient the last 3 boxes of 2.5mg  Onglyza and patient was told that the boxes would be in the front waiting on her.

## 2013-03-05 NOTE — Telephone Encounter (Signed)
Fine to give her some samples of the 2.5mg  Onglyza.

## 2013-03-05 NOTE — Telephone Encounter (Signed)
Patient wants to know if she can have some samples of the Onglyza 5mg  medication. We do have the onglyza medication but for only 2.5mg  is it okay for the to come and pick up some samples we only have 3 boxes left.

## 2013-04-16 ENCOUNTER — Telehealth: Payer: Self-pay | Admitting: *Deleted

## 2013-04-16 NOTE — Telephone Encounter (Signed)
Form is on your desk.

## 2013-04-16 NOTE — Telephone Encounter (Signed)
Patient aware from is upfront for pick up

## 2013-04-16 NOTE — Telephone Encounter (Signed)
Patient left message stating she has an appointment on Monday 8/4 for 3 month follow up, would like an order for labwork so she can get them done at [place]. Whatever labs you may need on her Monday then please complete a [employer] form so she may come by and pick that up before Monday.

## 2013-04-17 ENCOUNTER — Encounter: Payer: Self-pay | Admitting: Internal Medicine

## 2013-04-18 ENCOUNTER — Telehealth: Payer: Self-pay | Admitting: Internal Medicine

## 2013-04-18 NOTE — Telephone Encounter (Signed)
Left message to call back  

## 2013-04-18 NOTE — Telephone Encounter (Signed)
Recent labs show that blood sugars are elevated with A1c of 9.8%. I would like to bring pt in to discuss diabetes and changing medication.

## 2013-04-21 ENCOUNTER — Ambulatory Visit (INDEPENDENT_AMBULATORY_CARE_PROVIDER_SITE_OTHER): Payer: Medicare Other | Admitting: Internal Medicine

## 2013-04-21 ENCOUNTER — Encounter: Payer: Self-pay | Admitting: Internal Medicine

## 2013-04-21 VITALS — BP 130/68 | HR 80 | Temp 97.8°F | Wt 204.0 lb

## 2013-04-21 DIAGNOSIS — E785 Hyperlipidemia, unspecified: Secondary | ICD-10-CM

## 2013-04-21 DIAGNOSIS — R0989 Other specified symptoms and signs involving the circulatory and respiratory systems: Secondary | ICD-10-CM

## 2013-04-21 DIAGNOSIS — E119 Type 2 diabetes mellitus without complications: Secondary | ICD-10-CM | POA: Insufficient documentation

## 2013-04-21 DIAGNOSIS — I10 Essential (primary) hypertension: Secondary | ICD-10-CM

## 2013-04-21 LAB — HM DIABETES FOOT EXAM: HM Diabetic Foot Exam: NORMAL

## 2013-04-21 MED ORDER — INSULIN GLARGINE 100 UNIT/ML SOLOSTAR PEN: 10.0000 [IU] | PEN_INJECTOR | Freq: Every day | SUBCUTANEOUS | Status: DC

## 2013-04-21 NOTE — Assessment & Plan Note (Signed)
BP Readings from Last 3 Encounters:  04/21/13 130/68  02/19/13 140/70  01/23/13 118/74   BP well controlled on current medications. Will continue.

## 2013-04-21 NOTE — Assessment & Plan Note (Signed)
Recent LFTs normal. Recent LDL 90, which is above goal. 10 year CAD risk 48.2% based on ASCVD risk calculator, likely an underestimation given DM poorly controlled. Will consider change to more potent statin at next visit.

## 2013-04-21 NOTE — Progress Notes (Signed)
Subjective:    Patient ID: Diane Mullins, female    DOB: 1933-02-17, 79 y.o.   MRN: [id number]  HPI 79 year old female with history of diabetes, hypertension, hypothyroidism, hyperlipidemia presents for followup. Recent lab showed blood sugars were elevated with A1c of 9.8%. This is unchanged from previous. Patient reports that she has been compliant with her medications however she does note some dietary indiscretion. She reports that she is generally feeling well. She has no new concerns today. She continues to have some dyspnea with exertion. Cardiology evaluation has been normal. She was encouraged to start increasing her exercise however she has not yet started any particular program. She denies dyspnea at rest. She denies chest pain or palpitations. She occasionally has dizziness when standing quickly. She has not had any syncopal episodes.  Outpatient Encounter Prescriptions as of 04/21/2013  Medication Sig Dispense Refill  . ALPRAZolam (XANAX) 0.25 MG tablet Take 1 tablet (0.25 mg total) by mouth 3 (three) times daily as needed for sleep or anxiety.  90 tablet  3  . amLODipine (NORVASC) 5 MG tablet       . aspirin EC 81 MG tablet Take 81 mg by mouth daily.      . calcium-vitamin D (OSCAL) 250-125 MG-UNIT per tablet Take 1 tablet by mouth daily.      . fish oil-omega-3 fatty acids 1000 MG capsule Take 2 g by mouth daily.      . furosemide (LASIX) 20 MG tablet Take 20 mg by mouth daily.      Marland Kitchen glimepiride (AMARYL) 4 MG tablet Takes 1 tablet am and 1/2 tablet pm daily.  135 tablet  1  . glucose blood test strip Use as instructed  100 each  12  . levothyroxine (SYNTHROID, LEVOTHROID) 75 MCG tablet Take 1 tablet (75 mcg total) by mouth daily.  90 tablet  3  . losartan-hydrochlorothiazide (HYZAAR) 50-12.5 MG per tablet Take 1 tablet by mouth daily.  90 tablet  3  . metFORMIN (GLUCOPHAGE) 1000 MG tablet Take 1 tablet (1,000 mg total) by mouth 2 (two) times daily with a meal.  180 tablet  3  .  metoprolol succinate (TOPROL-XL) 50 MG 24 hr tablet Take 1 tablet (50 mg total) by mouth 2 (two) times daily.  180 tablet  3  . Multiple Vitamins-Minerals (MULTIVITAMIN WITH MINERALS) tablet Take 1 tablet by mouth daily.      Marland Kitchen omeprazole (PRILOSEC) 20 MG capsule Take 1 capsule (20 mg total) by mouth daily.  90 capsule  3  . saxagliptin HCl (ONGLYZA) 5 MG TABS tablet Take 1 tablet (5 mg total) by mouth daily.  90 tablet  3  . simvastatin (ZOCOR) 20 MG tablet Take 1 tablet (20 mg total) by mouth every evening.  90 tablet  3  . Insulin Glargine (LANTUS SOLOSTAR) 100 UNIT/ML SOPN Inject 10 Units into the skin daily.  5 pen  6   No facility-administered encounter medications on file as of 04/21/2013.   BP 130/68  Pulse 80  Temp(Src) 97.8 F (36.6 C) (Oral)  Wt 204 lb (92.534 kg)  BMI 38.57 kg/m2  SpO2 94%  Review of Systems  Constitutional: Negative for fever, chills, appetite change, fatigue and unexpected weight change.  HENT: Negative for ear pain, congestion, sore throat, trouble swallowing, neck pain, voice change and sinus pressure.   Eyes: Negative for visual disturbance.  Respiratory: Negative for cough, shortness of breath, wheezing and stridor.   Cardiovascular: Negative for chest pain, palpitations and leg  swelling.  Gastrointestinal: Negative for nausea, vomiting, abdominal pain, diarrhea, constipation, blood in stool, abdominal distention and anal bleeding.  Genitourinary: Negative for dysuria and flank pain.  Musculoskeletal: Negative for myalgias, arthralgias and gait problem.  Skin: Negative for color change and rash.  Neurological: Negative for dizziness and headaches.  Hematological: Negative for adenopathy. Does not bruise/bleed easily.  Psychiatric/Behavioral: Negative for suicidal ideas, sleep disturbance and dysphoric mood. The patient is not nervous/anxious.        Objective:   Physical Exam  Constitutional: She is oriented to person, place, and time. She appears  well-developed and well-nourished. No distress.  HENT:  Head: Normocephalic and atraumatic.  Right Ear: External ear normal.  Left Ear: External ear normal.  Nose: Nose normal.  Mouth/Throat: Oropharynx is clear and moist. No oropharyngeal exudate.  Eyes: Conjunctivae are normal. Pupils are equal, round, and reactive to light. Right eye exhibits no discharge. Left eye exhibits no discharge. No scleral icterus.  Neck: Normal range of motion. Neck supple. No tracheal deviation present. No thyromegaly present.  Cardiovascular: Normal rate, regular rhythm, normal heart sounds and intact distal pulses.  Exam reveals no gallop and no friction rub.   No murmur heard. Pulmonary/Chest: Effort normal and breath sounds normal. No accessory muscle usage. Not tachypneic. No respiratory distress. She has no decreased breath sounds. She has no wheezes. She has no rhonchi. She has no rales. She exhibits no tenderness.  Musculoskeletal: Normal range of motion. She exhibits no edema and no tenderness.  Lymphadenopathy:    She has no cervical adenopathy.  Neurological: She is alert and oriented to person, place, and time. No cranial nerve deficit. She exhibits normal muscle tone. Coordination normal.  Skin: Skin is warm and dry. No rash noted. She is not diaphoretic. No erythema. No pallor.  Psychiatric: She has a normal mood and affect. Her behavior is normal. Judgment and thought content normal.          Assessment & Plan:

## 2013-04-21 NOTE — Assessment & Plan Note (Signed)
Recent A1c 9.8%, which is unchanged from 3 months ago. Pilar Plate discussion today about poor control of diabetes mellitus. Will start Lantus 10 units daily. Nursing instruction given today. Pt instructed to check blood sugars twice daily and plan to bring to next visit. We also discussed need to titrate up on dose of Lantus. Will set up diabetes education. Followup in 2 weeks. Foot exam normal today.

## 2013-04-21 NOTE — Telephone Encounter (Signed)
Patient came in today for appointment

## 2013-04-21 NOTE — Assessment & Plan Note (Signed)
Persistent symptoms of exertional dyspnea. Likely due to deconditioning. However, patient at high risk for coronary artery disease. Will discuss with her cardiologist ordering stress test for further evaluation.

## 2013-04-22 ENCOUNTER — Other Ambulatory Visit: Payer: Self-pay | Admitting: *Deleted

## 2013-04-22 MED ORDER — ALCOHOL SWABS PADS: MEDICATED_PAD | Status: DC

## 2013-04-22 NOTE — Telephone Encounter (Signed)
Patient left a voicemail stating she need alcohol swabs faxed to [facility]. Rx faxed.

## 2013-04-24 ENCOUNTER — Encounter: Payer: Self-pay | Admitting: Internal Medicine

## 2013-04-29 ENCOUNTER — Other Ambulatory Visit: Payer: Self-pay | Admitting: Cardiovascular Disease

## 2013-04-29 ENCOUNTER — Telehealth: Payer: Self-pay

## 2013-04-29 DIAGNOSIS — R0600 Dyspnea, unspecified: Secondary | ICD-10-CM

## 2013-04-29 NOTE — Telephone Encounter (Signed)
Scheduled Echo for 05/09/13 at 10am.  Pt aware.

## 2013-04-29 NOTE — Telephone Encounter (Signed)
Message copied by Theophilus Kinds on Tue Apr 29, 2013 12:13 PM ------      Message from: Minna Merritts      Created: Mon Apr 28, 2013 10:47 PM      Regarding: FW: Dyspnea       Can we set up an echo for this patient in [place]      Dx of SOB      thx      Tim                  ----- Message -----         From: Rica Mast, MD         Sent: 04/28/2013   8:33 AM           To: Minna Merritts, MD      Subject: RE: Dyspnea                                              Thanks! Could we set up the ECHO before her visit with you next month?      ----- Message -----         From: Minna Merritts, MD         Sent: 04/27/2013   9:58 PM           To: Rica Mast, MD      Subject: RE: Dyspnea                                              Stress echo may not be a good idea as she has to treadmill well for good echo pictures.      May be bettter to do treadmill even lexiscan myoview.      Also noticed she does not have an echo.      One option may be to start with echo then go to stress if needed?      Let me know what I can do      thx      Tim            ----- Message -----         From: Rica Mast, MD         Sent: 04/21/2013  12:26 PM           To: Minna Merritts, MD      Subject: Dyspnea                                                  Tim,      Ms. Mcree continues to have dyspnea on exertion. Certainly deconditioning in playing a role, however she has several risk factors for coronary artery disease including poorly controlled diabetes, hyperlipidemia. I can't find in her records that she has ever had a stress test. Do you think it might be helpful to order a stress ECHO?      Thanks,      Delsa Sale                   ------

## 2013-05-06 ENCOUNTER — Encounter: Payer: Self-pay | Admitting: *Deleted

## 2013-05-07 ENCOUNTER — Encounter: Payer: Self-pay | Admitting: Internal Medicine

## 2013-05-07 ENCOUNTER — Ambulatory Visit (INDEPENDENT_AMBULATORY_CARE_PROVIDER_SITE_OTHER): Payer: Medicare Other | Admitting: Internal Medicine

## 2013-05-07 VITALS — BP 124/60 | HR 68 | Temp 97.9°F | Wt 205.0 lb

## 2013-05-07 DIAGNOSIS — R0609 Other forms of dyspnea: Secondary | ICD-10-CM

## 2013-05-07 DIAGNOSIS — R0989 Other specified symptoms and signs involving the circulatory and respiratory systems: Secondary | ICD-10-CM

## 2013-05-07 DIAGNOSIS — R269 Unspecified abnormalities of gait and mobility: Secondary | ICD-10-CM

## 2013-05-07 DIAGNOSIS — IMO0001 Reserved for inherently not codable concepts without codable children: Secondary | ICD-10-CM

## 2013-05-07 NOTE — Progress Notes (Signed)
Subjective:    Patient ID: Diane Mullins, female    DOB: 1933-01-03, 79 y.o.   MRN: [id number]  HPI 79 year old female with history of diabetes, hypertension, hyperlipidemia presents for followup. At her last visit her A1c was noted to be markedly elevated at 9.8%. She was started on Lantus 10 units daily. She reports significant improvement in her blood sugars with most fasting blood sugars near 90. She denies any low blood sugars less than 70 or blood sugars greater than 200. She reports she is tolerating the Lantus well.  She continues to have some shortness of breath with exertion. She does not have any chest pain, palpitations, diaphoresis. She is now scheduled for cardiology evaluation with echo next week.  She is also concerned about intermittent instability of her gait. She reports this is most common in the mornings. It seems to improve throughout the day. She denies any recent falls. She notes that she went to physical therapy for strengthening and falls prevention in the past. She denies any new focal abnormalities such as focal weakness, numbness.  Outpatient Encounter Prescriptions as of 05/07/2013  Medication Sig Dispense Refill  . Alcohol Swabs PADS Please use to cleanse skin before testing your blood sugars at home 1-2 times daily.  100 each  3  . ALPRAZolam (XANAX) 0.25 MG tablet Take 1 tablet (0.25 mg total) by mouth 3 (three) times daily as needed for sleep or anxiety.  90 tablet  3  . amLODipine (NORVASC) 5 MG tablet       . aspirin EC 81 MG tablet Take 81 mg by mouth daily.      . calcium-vitamin D (OSCAL) 250-125 MG-UNIT per tablet Take 1 tablet by mouth daily.      . fish oil-omega-3 fatty acids 1000 MG capsule Take 2 g by mouth daily.      . furosemide (LASIX) 20 MG tablet Take 20 mg by mouth daily.      Marland Kitchen glimepiride (AMARYL) 4 MG tablet Takes 1 tablet am and 1/2 tablet pm daily.  135 tablet  1  . glucose blood test strip Use as instructed  100 each  12  . Insulin  Glargine (LANTUS SOLOSTAR) 100 UNIT/ML SOPN Inject 10 Units into the skin daily.  5 pen  6  . levothyroxine (SYNTHROID, LEVOTHROID) 75 MCG tablet Take 1 tablet (75 mcg total) by mouth daily.  90 tablet  3  . losartan-hydrochlorothiazide (HYZAAR) 50-12.5 MG per tablet Take 1 tablet by mouth daily.  90 tablet  3  . metFORMIN (GLUCOPHAGE) 1000 MG tablet Take 1 tablet (1,000 mg total) by mouth 2 (two) times daily with a meal.  180 tablet  3  . metoprolol succinate (TOPROL-XL) 50 MG 24 hr tablet Take 1 tablet (50 mg total) by mouth 2 (two) times daily.  180 tablet  3  . Multiple Vitamins-Minerals (MULTIVITAMIN WITH MINERALS) tablet Take 1 tablet by mouth daily.      Marland Kitchen omeprazole (PRILOSEC) 20 MG capsule Take 1 capsule (20 mg total) by mouth daily.  90 capsule  3  . saxagliptin HCl (ONGLYZA) 5 MG TABS tablet Take 1 tablet (5 mg total) by mouth daily.  90 tablet  3  . simvastatin (ZOCOR) 20 MG tablet Take 1 tablet (20 mg total) by mouth every evening.  90 tablet  3   No facility-administered encounter medications on file as of 05/07/2013.   BP 124/60  Pulse 68  Temp(Src) 97.9 F (36.6 C) (Oral)  Wt 205 lb (  92.987 kg)  BMI 38.75 kg/m2  SpO2 96%  Review of Systems  Constitutional: Negative for fever, chills, appetite change, fatigue and unexpected weight change.  HENT: Negative for ear pain, congestion, sore throat, trouble swallowing, neck pain, voice change and sinus pressure.   Eyes: Negative for visual disturbance.  Respiratory: Positive for shortness of breath (with minimal exertion). Negative for cough, wheezing and stridor.   Cardiovascular: Negative for chest pain, palpitations and leg swelling.  Gastrointestinal: Negative for nausea, vomiting, abdominal pain, diarrhea, constipation, blood in stool, abdominal distention and anal bleeding.  Genitourinary: Negative for dysuria and flank pain.  Musculoskeletal: Negative for myalgias, arthralgias and gait problem.  Skin: Negative for color  change and rash.  Neurological: Negative for dizziness and headaches.  Hematological: Negative for adenopathy. Does not bruise/bleed easily.  Psychiatric/Behavioral: Negative for suicidal ideas, sleep disturbance and dysphoric mood. The patient is not nervous/anxious.        Objective:   Physical Exam  Constitutional: She is oriented to person, place, and time. She appears well-developed and well-nourished. No distress.  HENT:  Head: Normocephalic and atraumatic.  Right Ear: External ear normal.  Left Ear: External ear normal.  Nose: Nose normal.  Mouth/Throat: Oropharynx is clear and moist. No oropharyngeal exudate.  Eyes: Conjunctivae are normal. Pupils are equal, round, and reactive to light. Right eye exhibits no discharge. Left eye exhibits no discharge. No scleral icterus.  Neck: Normal range of motion. Neck supple. No tracheal deviation present. No thyromegaly present.  Cardiovascular: Normal rate, regular rhythm, normal heart sounds and intact distal pulses.  Exam reveals no gallop and no friction rub.   No murmur heard. Pulmonary/Chest: Effort normal and breath sounds normal. No accessory muscle usage. Not tachypneic. No respiratory distress. She has no decreased breath sounds. She has no wheezes. She has no rhonchi. She has no rales. She exhibits no tenderness.  Musculoskeletal: Normal range of motion. She exhibits no edema and no tenderness.  Lymphadenopathy:    She has no cervical adenopathy.  Neurological: She is alert and oriented to person, place, and time. No cranial nerve deficit. She exhibits normal muscle tone. Coordination normal.  Skin: Skin is warm and dry. No rash noted. She is not diaphoretic. No erythema. No pallor.  Psychiatric: She has a normal mood and affect. Her behavior is normal. Judgment and thought content normal.          Assessment & Plan:

## 2013-05-07 NOTE — Assessment & Plan Note (Signed)
Patient notes some intermittent problems with balance and gait. We discussed potential referral for physical therapy for strengthening and falls prevention. She has gone through this before and prefers to hold off for now. We discussed use of a cane to help with stability.

## 2013-05-07 NOTE — Assessment & Plan Note (Signed)
Persistent. Cardiology evaluation and ECHO pending.

## 2013-05-07 NOTE — Assessment & Plan Note (Signed)
Blood sugars much improved with use of Lantus. Will continue Lantus at 10units daily, Glimepiride and Onglyza. Will plan to repeat A1c in 07/2013. Pt will call if any BG<70 or persistent BG>200.

## 2013-05-09 ENCOUNTER — Other Ambulatory Visit: Payer: Medicare Other

## 2013-05-20 ENCOUNTER — Other Ambulatory Visit: Payer: Medicare Other

## 2013-05-27 ENCOUNTER — Other Ambulatory Visit: Payer: Self-pay | Admitting: *Deleted

## 2013-05-27 ENCOUNTER — Other Ambulatory Visit: Payer: Medicare Other

## 2013-05-27 MED ORDER — INSULIN PEN NEEDLE 31G X 5 MM MISC: Status: DC

## 2013-05-27 NOTE — Telephone Encounter (Signed)
Patient called requesting needles for her insulin pens and samples of Onglyza. Sent prescription to the pharmacy and left samples of needles and Onglyza up front for patient pick up.

## 2013-05-28 ENCOUNTER — Ambulatory Visit: Payer: Medicare Other | Admitting: Cardiovascular Disease

## 2013-06-06 ENCOUNTER — Other Ambulatory Visit: Payer: Self-pay | Admitting: *Deleted

## 2013-06-06 NOTE — Telephone Encounter (Signed)
Patient dropped off from from [facility]. Spoke with [name] the pharmacist at [facility] order given for her pen needles per patient request.

## 2013-06-12 ENCOUNTER — Telehealth: Payer: Self-pay | Admitting: *Deleted

## 2013-06-12 NOTE — Telephone Encounter (Signed)
Called the physician line at [phone] and spoke to [place] on Friday 9/19. They will dispense patient medication and ship to her home.

## 2013-06-13 ENCOUNTER — Other Ambulatory Visit: Payer: Medicare Other

## 2013-06-17 ENCOUNTER — Other Ambulatory Visit: Payer: Self-pay

## 2013-06-17 ENCOUNTER — Other Ambulatory Visit (INDEPENDENT_AMBULATORY_CARE_PROVIDER_SITE_OTHER): Payer: Medicare Other

## 2013-06-17 DIAGNOSIS — R0789 Other chest pain: Secondary | ICD-10-CM

## 2013-06-17 DIAGNOSIS — R0600 Dyspnea, unspecified: Secondary | ICD-10-CM

## 2013-06-17 DIAGNOSIS — R0609 Other forms of dyspnea: Secondary | ICD-10-CM

## 2013-06-20 ENCOUNTER — Ambulatory Visit: Payer: Medicare Other | Admitting: Cardiovascular Disease

## 2013-06-20 ENCOUNTER — Telehealth: Payer: Self-pay

## 2013-06-20 NOTE — Telephone Encounter (Signed)
Message copied by Stana Bunting on Fri Jun 20, 2013  3:45 PM ------      Message from: Minna Merritts      Created: Thu Jun 19, 2013 10:38 PM       Relatively normal echo      Ef normal, good valves       Ok pressures ------

## 2013-06-20 NOTE — Telephone Encounter (Signed)
Spoke w/ pt.  She is aware of results.  

## 2013-06-20 NOTE — Telephone Encounter (Signed)
Message copied by Stana Bunting on Fri Jun 20, 2013  8:51 AM ------      Message from: Minna Merritts      Created: Thu Jun 19, 2013 10:38 PM       Relatively normal echo      Ef normal, good valves       Ok pressures ------

## 2013-06-20 NOTE — Telephone Encounter (Signed)
Message copied by Stana Bunting on Fri Jun 20, 2013  8:49 AM ------      Message from: Minna Merritts      Created: Thu Jun 19, 2013 10:38 PM       Relatively normal echo      Ef normal, good valves       Ok pressures ------

## 2013-06-23 ENCOUNTER — Encounter: Payer: Self-pay | Admitting: Cardiovascular Disease

## 2013-06-23 ENCOUNTER — Ambulatory Visit (INDEPENDENT_AMBULATORY_CARE_PROVIDER_SITE_OTHER): Payer: Medicare Other | Admitting: Cardiovascular Disease

## 2013-06-23 VITALS — BP 148/68 | HR 87 | Ht 61.0 in | Wt 205.2 lb

## 2013-06-23 DIAGNOSIS — I10 Essential (primary) hypertension: Secondary | ICD-10-CM

## 2013-06-23 DIAGNOSIS — E785 Hyperlipidemia, unspecified: Secondary | ICD-10-CM

## 2013-06-23 DIAGNOSIS — R000 Tachycardia, unspecified: Secondary | ICD-10-CM

## 2013-06-23 NOTE — Assessment & Plan Note (Signed)
Blood pressure elevated today. Suggested she closely monitor this at home. The past several visits with Dr. walker, blood pressure has been acceptable. No medication changes made

## 2013-06-23 NOTE — Assessment & Plan Note (Signed)
She reports symptoms of tachycardia have improved on metoprolol. Suggested she take extra metoprolol for breakthrough tachycardia

## 2013-06-23 NOTE — Assessment & Plan Note (Signed)
Recently started on insulin. She reports sugars have improved. Encouraged her to start a walking program for weight loss

## 2013-06-23 NOTE — Assessment & Plan Note (Signed)
I suspect her cholesterol was significantly improved as diabetes numbers improve. Suggested she stay on simvastatin

## 2013-06-23 NOTE — Patient Instructions (Addendum)
You are doing well. No medication changes were made.  Keep working on they diabetes, strict diet,  Walk everyday for leg strength Take an extra metoprolol if you feel fast heart rate or palpitations  Please call us if you have new issues that need to be addressed before your next appt.  Your physician wants you to follow-up in: 6 months.  You will receive a reminder letter in the mail two months in advance. If you don't receive a letter, please call our office to schedule the follow-up appointment.

## 2013-06-23 NOTE — Progress Notes (Signed)
Patient ID: Diane Mullins, female    DOB: 1933/04/22, 79 y.o.   MRN: [id number]  HPI Comments: 79 year old female with history of diabetes, hypertension, hyperlipidemia,  patient of Dr. Gilford Rile, history of tachycardia who presents for routine followup  In followup today, she reports that overall she is doing well but she has significant leg weakness. She tries to do water aerobics 2 days per week. Sedentary the other days. She does not do any regular walking program. Blood pressure at home has typically been 0000000 systolic over 0000000 to [id number]. Elevated today which she reports as above her baseline.  Recent echocardiogram was essentially normal with ejection fraction greater than 55%. Total cholesterol 172 last year, hemoglobin A1c 9.8, now on insulin which she states is helping her sugars. She is taking metoprolol once per day, Lasix daily with aspirin and losartan HCTZ  She continues to battle with her weight. Sugars have been a challenge. She is trying to watch her diet. Would like to stay away from insulin. Notes indicate Last A1c was 10%.   EKG shows sinus tachycardia with rate 87 beats per minute, nonspecific ST and T wave abnormality   Outpatient Encounter Prescriptions as of 06/23/2013  Medication Sig Dispense Refill  . Alcohol Swabs PADS Please use to cleanse skin before testing your blood sugars at home 1-2 times daily.  100 each  3  . ALPRAZolam (XANAX) 0.25 MG tablet Take 1 tablet (0.25 mg total) by mouth 3 (three) times daily as needed for sleep or anxiety.  90 tablet  3  . aspirin EC 81 MG tablet Take 81 mg by mouth daily.      . calcium-vitamin D (OSCAL) 250-125 MG-UNIT per tablet Take 1 tablet by mouth daily.      . fish oil-omega-3 fatty acids 1000 MG capsule Take 2 g by mouth daily.      . furosemide (LASIX) 20 MG tablet Take 20 mg by mouth daily.      Marland Kitchen glimepiride (AMARYL) 4 MG tablet Takes 1 tablet am and 1/2 tablet pm daily.  135 tablet  1  . glucose blood test strip Use as  instructed  100 each  12  . Insulin Glargine (LANTUS SOLOSTAR) 100 UNIT/ML SOPN Inject 10 Units into the skin daily.  5 pen  6  . Insulin Pen Needle (B-D UF III MINI PEN NEEDLES) 31G X 5 MM MISC Please use needles as directed once a day with your insulin pen. Dx code 250.00  100 each  5  . levothyroxine (SYNTHROID, LEVOTHROID) 75 MCG tablet Take 1 tablet (75 mcg total) by mouth daily.  90 tablet  3  . losartan-hydrochlorothiazide (HYZAAR) 50-12.5 MG per tablet Take 1 tablet by mouth daily.  90 tablet  3  . metFORMIN (GLUCOPHAGE) 1000 MG tablet Take 1 tablet (1,000 mg total) by mouth 2 (two) times daily with a meal.  180 tablet  3  . metoprolol succinate (TOPROL-XL) 50 MG 24 hr tablet Take 50 mg by mouth daily.      . Multiple Vitamins-Minerals (MULTIVITAMIN WITH MINERALS) tablet Take 1 tablet by mouth daily.      Marland Kitchen omeprazole (PRILOSEC) 20 MG capsule Take 1 capsule (20 mg total) by mouth daily.  90 capsule  3  . saxagliptin HCl (ONGLYZA) 5 MG TABS tablet Take 1 tablet (5 mg total) by mouth daily.  90 tablet  3  . simvastatin (ZOCOR) 20 MG tablet Take 1 tablet (20 mg total) by mouth every evening.  90 tablet  3  . [DISCONTINUED] metoprolol succinate (TOPROL-XL) 50 MG 24 hr tablet Take 1 tablet (50 mg total) by mouth 2 (two) times daily.  180 tablet  3  . [DISCONTINUED] amLODipine (NORVASC) 5 MG tablet        No facility-administered encounter medications on file as of 06/23/2013.    Review of Systems  Constitutional: Negative.   HENT: Negative.   Eyes: Negative.   Respiratory: Positive for shortness of breath.   Cardiovascular: Positive for palpitations.  Gastrointestinal: Negative.   Musculoskeletal: Negative.   Skin: Negative.   Neurological: Negative.   Psychiatric/Behavioral: Negative.   All other systems reviewed and are negative.    BP 148/68  Pulse 87  Ht 5\' 1"  (1.549 m)  Wt 205 lb 4 oz (93.101 kg)  BMI 38.8 kg/m2  Physical Exam  Nursing note and vitals  reviewed. Constitutional: She is oriented to person, place, and time. She appears well-developed and well-nourished.  Obese  HENT:  Head: Normocephalic.  Nose: Nose normal.  Mouth/Throat: Oropharynx is clear and moist.  Eyes: Conjunctivae are normal. Pupils are equal, round, and reactive to light.  Neck: Normal range of motion. Neck supple. No JVD present.  Cardiovascular: Regular rhythm, S1 normal, S2 normal, normal heart sounds and intact distal pulses.  Tachycardia present.  Exam reveals no gallop and no friction rub.   No murmur heard. Pulmonary/Chest: Effort normal and breath sounds normal. No respiratory distress. She has no wheezes. She has no rales. She exhibits no tenderness.  Abdominal: Soft. Bowel sounds are normal. She exhibits no distension. There is no tenderness.  Musculoskeletal: Normal range of motion. She exhibits no edema and no tenderness.  Lymphadenopathy:    She has no cervical adenopathy.  Neurological: She is alert and oriented to person, place, and time. Coordination normal.  Skin: Skin is warm and dry. No rash noted. No erythema.  Psychiatric: She has a normal mood and affect. Her behavior is normal. Judgment and thought content normal.    Assessment and Plan

## 2013-07-01 ENCOUNTER — Encounter: Payer: Self-pay | Admitting: *Deleted

## 2013-07-02 ENCOUNTER — Ambulatory Visit (INDEPENDENT_AMBULATORY_CARE_PROVIDER_SITE_OTHER): Payer: Medicare Other

## 2013-07-02 DIAGNOSIS — Z23 Encounter for immunization: Secondary | ICD-10-CM

## 2013-07-11 ENCOUNTER — Telehealth: Payer: Self-pay | Admitting: *Deleted

## 2013-07-11 DIAGNOSIS — E119 Type 2 diabetes mellitus without complications: Secondary | ICD-10-CM

## 2013-07-11 NOTE — Telephone Encounter (Signed)
Refill Request  Accu- chek avia  Test twice daily

## 2013-07-14 ENCOUNTER — Ambulatory Visit: Payer: Medicare Other | Admitting: Internal Medicine

## 2013-07-14 MED ORDER — GLUCOSE BLOOD VI STRP: ORAL_STRIP | Status: DC

## 2013-07-14 NOTE — Telephone Encounter (Signed)
Rx sent to pharmacy   

## 2013-07-15 ENCOUNTER — Telehealth: Payer: Self-pay | Admitting: *Deleted

## 2013-07-15 NOTE — Telephone Encounter (Signed)
Patient left a voicemail stating [place] faxed a form over so she will be able to get her test strips. [place] and she will fax it to my attention again today because I never received the form.

## 2013-07-16 NOTE — Telephone Encounter (Signed)
Received fax on yesterday and it was completed then faxed back to [facility].

## 2013-07-16 NOTE — Telephone Encounter (Signed)
Spoke with patient after she left an additional message on voicemail yesterday afternoon at 4:00 in reference to strips again. Returned her call this morning and she state she called [facility] and they did receive request from our office but they do not have any strips in .

## 2013-07-22 ENCOUNTER — Telehealth: Payer: Self-pay | Admitting: *Deleted

## 2013-07-22 NOTE — Telephone Encounter (Signed)
Patient aware form is at front desk for her to pick up

## 2013-07-22 NOTE — Telephone Encounter (Signed)
Patient left a voicemail stating she has an appointment this week. She would like to come by to pick up a [facility] requisition so she may have her labs done prior to her appointment. Please advise.

## 2013-07-22 NOTE — Telephone Encounter (Signed)
Orders on your desk  

## 2013-07-23 ENCOUNTER — Other Ambulatory Visit: Payer: Self-pay | Admitting: Internal Medicine

## 2013-07-24 LAB — COMPREHENSIVE METABOLIC PANEL
ALT: 17 IU/L (ref 0–32)
AST: 16 IU/L (ref 0–40)
CO2: 28 mmol/L (ref 18–29)
Calcium: 9.6 mg/dL (ref 8.6–10.2)
Chloride: 96 mmol/L — ABNORMAL LOW (ref 97–108)
Glucose: 182 mg/dL — ABNORMAL HIGH (ref 65–99)
Potassium: 4.5 mmol/L (ref 3.5–5.2)
Sodium: 141 mmol/L (ref 134–144)

## 2013-07-24 LAB — LIPID PANEL W/O CHOL/HDL RATIO
Cholesterol, Total: 171 mg/dL (ref 100–199)
HDL: 49 mg/dL (ref >39)
Triglycerides: 217 mg/dL — ABNORMAL HIGH (ref 0–149)

## 2013-07-24 LAB — MICROALBUMIN / CREATININE URINE RATIO
Creatinine, Ur: 90.4 mg/dL (ref 15.0–278.0)
MICROALB/CREAT RATIO: 11 mg/g creat (ref 0.0–30.0)
Microalbumin, Urine: 9.9 ug/mL (ref 0.0–17.0)

## 2013-07-24 LAB — HGB A1C W/O EAG: Hgb A1c MFr Bld: 9.2 % — ABNORMAL HIGH (ref 4.8–5.6)

## 2013-07-24 LAB — TSH: TSH: 2.33 u[IU]/mL (ref 0.450–4.500)

## 2013-07-25 ENCOUNTER — Ambulatory Visit (INDEPENDENT_AMBULATORY_CARE_PROVIDER_SITE_OTHER): Payer: Medicare Other | Admitting: Internal Medicine

## 2013-07-25 ENCOUNTER — Encounter: Payer: Self-pay | Admitting: Internal Medicine

## 2013-07-25 VITALS — BP 130/70 | HR 73 | Temp 98.3°F | Wt 208.0 lb

## 2013-07-25 DIAGNOSIS — F411 Generalized anxiety disorder: Secondary | ICD-10-CM

## 2013-07-25 DIAGNOSIS — R269 Unspecified abnormalities of gait and mobility: Secondary | ICD-10-CM

## 2013-07-25 MED ORDER — INSULIN GLARGINE 100 UNIT/ML SOLOSTAR PEN: 15.0000 [IU] | PEN_INJECTOR | Freq: Every day | SUBCUTANEOUS | Status: DC

## 2013-07-25 MED ORDER — ALPRAZOLAM 0.25 MG PO TABS: 0.2500 mg | ORAL_TABLET | Freq: Three times a day (TID) | ORAL | Status: DC | PRN

## 2013-07-25 MED ORDER — GLIMEPIRIDE 4 MG PO TABS: ORAL_TABLET | ORAL | Status: DC

## 2013-07-25 NOTE — Progress Notes (Signed)
Subjective:    Patient ID: Diane Mullins, female    DOB: [birth year], 79 y.o.   MRN: [id number]  HPI 79 year old female with history of diabetes, hypertension, hyperlipidemia presents for followup. She reports that blood sugars have been relatively well controlled at home, typically near 150. She has had a few blood sugars near 70. However, recent A1c shows more elevated blood sugars, with elevated A1c at 9%. She has been compliant with medication. She reports this difficult for her to afford the Onglyza, as it is over 500 dollars per month, however she has been using samples.  At her last visit, she was concerned about intermittent gait instability, she reports that symptoms are improved. She has not had any recent falls. She is active and feeling well.  Outpatient Encounter Prescriptions as of 07/25/2013  Medication Sig  . Alcohol Swabs PADS Please use to cleanse skin before testing your blood sugars at home 1-2 times daily.  Marland Kitchen ALPRAZolam (XANAX) 0.25 MG tablet Take 1 tablet (0.25 mg total) by mouth 3 (three) times daily as needed for sleep or anxiety.  Marland Kitchen aspirin EC 81 MG tablet Take 81 mg by mouth daily.  . calcium-vitamin D (OSCAL) 250-125 MG-UNIT per tablet Take 1 tablet by mouth daily.  . fish oil-omega-3 fatty acids 1000 MG capsule Take 2 g by mouth daily.  . furosemide (LASIX) 20 MG tablet Take 20 mg by mouth daily.  Marland Kitchen glimepiride (AMARYL) 4 MG tablet Takes 1 tablet am and 1/2 tablet pm daily.  Marland Kitchen glucose blood (ACCU-CHEK AVIVA) test strip PLEASE USE AT HOME TO CHECK BLOOD SUGARS TWICE A DAY. DX CODE: 250.00  . glucose blood test strip Use as instructed  . Insulin Glargine (LANTUS SOLOSTAR) 100 UNIT/ML SOPN Inject 15 Units into the skin daily.  . Insulin Pen Needle (B-D UF III MINI PEN NEEDLES) 31G X 5 MM MISC Please use needles as directed once a day with your insulin pen. Dx code 250.00  . levothyroxine (SYNTHROID, LEVOTHROID) 75 MCG tablet Take 1 tablet (75 mcg total) by mouth daily.  Marland Kitchen  losartan-hydrochlorothiazide (HYZAAR) 50-12.5 MG per tablet Take 1 tablet by mouth daily.  . metFORMIN (GLUCOPHAGE) 1000 MG tablet Take 1 tablet (1,000 mg total) by mouth 2 (two) times daily with a meal.  . metoprolol succinate (TOPROL-XL) 50 MG 24 hr tablet Take 50 mg by mouth daily.  . Multiple Vitamins-Minerals (MULTIVITAMIN WITH MINERALS) tablet Take 1 tablet by mouth daily.  Marland Kitchen omeprazole (PRILOSEC) 20 MG capsule Take 1 capsule (20 mg total) by mouth daily.  . simvastatin (ZOCOR) 20 MG tablet Take 1 tablet (20 mg total) by mouth every evening.   BP 130/70  Pulse 73  Temp(Src) 98.3 F (36.8 C) (Oral)  Wt 208 lb (94.348 kg)  SpO2 96%  Review of Systems  Constitutional: Negative for fever, chills, appetite change, fatigue and unexpected weight change.  HENT: Negative for congestion, ear pain, sinus pressure, sore throat, trouble swallowing and voice change.   Eyes: Negative for visual disturbance.  Respiratory: Negative for cough, shortness of breath, wheezing and stridor.   Cardiovascular: Negative for chest pain, palpitations and leg swelling.  Gastrointestinal: Negative for nausea, vomiting, abdominal pain, diarrhea, constipation, blood in stool, abdominal distention and anal bleeding.  Genitourinary: Negative for dysuria and flank pain.  Musculoskeletal: Negative for arthralgias, gait problem, myalgias and neck pain.  Skin: Negative for color change and rash.  Neurological: Negative for dizziness and headaches.  Hematological: Negative for adenopathy. Does not bruise/bleed easily.  Psychiatric/Behavioral: Negative for suicidal ideas, sleep disturbance and dysphoric mood. The patient is not nervous/anxious.        Objective:   Physical Exam  Constitutional: She is oriented to person, place, and time. She appears well-developed and well-nourished. No distress.  HENT:  Head: Normocephalic and atraumatic.  Right Ear: External ear normal.  Left Ear: External ear normal.  Nose:  Nose normal.  Mouth/Throat: Oropharynx is clear and moist. No oropharyngeal exudate.  Eyes: Conjunctivae are normal. Pupils are equal, round, and reactive to light. Right eye exhibits no discharge. Left eye exhibits no discharge. No scleral icterus.  Neck: Normal range of motion. Neck supple. No tracheal deviation present. No thyromegaly present.  Cardiovascular: Normal rate, regular rhythm, normal heart sounds and intact distal pulses.  Exam reveals no gallop and no friction rub.   No murmur heard. Pulmonary/Chest: Effort normal and breath sounds normal. No accessory muscle usage. Not tachypneic. No respiratory distress. She has no decreased breath sounds. She has no wheezes. She has no rhonchi. She has no rales. She exhibits no tenderness.  Musculoskeletal: Normal range of motion. She exhibits no edema and no tenderness.  Lymphadenopathy:    She has no cervical adenopathy.  Neurological: She is alert and oriented to person, place, and time. No cranial nerve deficit. She exhibits normal muscle tone. Coordination normal.  Skin: Skin is warm and dry. No rash noted. She is not diaphoretic. No erythema. No pallor.  Psychiatric: She has a normal mood and affect. Her behavior is normal. Judgment and thought content normal.          Assessment & Plan:

## 2013-07-25 NOTE — Assessment & Plan Note (Signed)
No recurrent symptoms of gait imbalance. Will continue to monitor.

## 2013-07-25 NOTE — Patient Instructions (Signed)
Please increase Lantus to 15units daily.  Monitor blood sugars 1-2 times daily. Call if any blood sugars less than 70.

## 2013-07-25 NOTE — Assessment & Plan Note (Signed)
Lab Results  Component Value Date   HGBA1C 9.2* 07/23/2013   Blood sugars have been elevated based on recent A1c. Will increase Lantus to 15 units daily. She will monitor blood sugars 2 or 3 times daily and bring record in 4 weeks. She will call if any blood sugars consistently above 250 or any sugars below 70.

## 2013-07-25 NOTE — Progress Notes (Signed)
Pre-visit discussion using our clinic review tool. No additional management support is needed unless otherwise documented below in the visit note.  

## 2013-08-27 ENCOUNTER — Ambulatory Visit: Payer: Medicare Other | Admitting: Pharmacist Clinician (PhC)/ Clinical Pharmacy Specialist

## 2013-08-29 ENCOUNTER — Ambulatory Visit: Payer: Medicare Other | Admitting: Internal Medicine

## 2013-09-01 ENCOUNTER — Encounter: Payer: Self-pay | Admitting: Internal Medicine

## 2013-09-01 ENCOUNTER — Ambulatory Visit (INDEPENDENT_AMBULATORY_CARE_PROVIDER_SITE_OTHER): Payer: Medicare Other | Admitting: Internal Medicine

## 2013-09-01 ENCOUNTER — Telehealth: Payer: Self-pay | Admitting: Internal Medicine

## 2013-09-01 VITALS — BP 130/60 | HR 64 | Temp 97.7°F | Wt 208.0 lb

## 2013-09-01 DIAGNOSIS — J209 Acute bronchitis, unspecified: Secondary | ICD-10-CM

## 2013-09-01 MED ORDER — AZITHROMYCIN 250 MG PO TABS: ORAL_TABLET | ORAL | Status: DC

## 2013-09-01 MED ORDER — HYDROCOD POLST-CHLORPHEN POLST 10-8 MG/5ML PO LQCR: 5.0000 mL | Freq: Two times a day (BID) | ORAL | Status: DC | PRN

## 2013-09-01 NOTE — Progress Notes (Signed)
Subjective:    Patient ID: Diane Mullins, female    DOB: 07-21-1933, 79 y.o.   MRN: [id number]  HPI 79YO female presents for acute visit c/o 4-5 days of cough. Initially started with sore throat and profuse rhinorrhea which have resolved. No fever, chills. No shortness of breath. Cough is non-productive. No nasal congestion. She has been taking over-the-counter cough and cold preparations with no improvement. Cough is keeping her from sleeping at night.  Outpatient Encounter Prescriptions as of 09/01/2013  Medication Sig  . Alcohol Swabs PADS Please use to cleanse skin before testing your blood sugars at home 1-2 times daily.  Marland Kitchen ALPRAZolam (XANAX) 0.25 MG tablet Take 1 tablet (0.25 mg total) by mouth 3 (three) times daily as needed for sleep or anxiety.  Marland Kitchen aspirin EC 81 MG tablet Take 81 mg by mouth daily.  . calcium-vitamin D (OSCAL) 250-125 MG-UNIT per tablet Take 1 tablet by mouth daily.  . fish oil-omega-3 fatty acids 1000 MG capsule Take 2 g by mouth daily.  . furosemide (LASIX) 20 MG tablet Take 20 mg by mouth daily.  Marland Kitchen glimepiride (AMARYL) 4 MG tablet Takes 1 tablet am and 1/2 tablet pm daily.  Marland Kitchen glucose blood (ACCU-CHEK AVIVA) test strip PLEASE USE AT HOME TO CHECK BLOOD SUGARS TWICE A DAY. DX CODE: 250.00  . glucose blood test strip Use as instructed  . Insulin Glargine (LANTUS SOLOSTAR) 100 UNIT/ML SOPN Inject 15 Units into the skin daily.  . Insulin Pen Needle (B-D UF III MINI PEN NEEDLES) 31G X 5 MM MISC Please use needles as directed once a day with your insulin pen. Dx code 250.00  . levothyroxine (SYNTHROID, LEVOTHROID) 75 MCG tablet Take 1 tablet (75 mcg total) by mouth daily.  Marland Kitchen losartan-hydrochlorothiazide (HYZAAR) 50-12.5 MG per tablet Take 1 tablet by mouth daily.  . metFORMIN (GLUCOPHAGE) 1000 MG tablet Take 1 tablet (1,000 mg total) by mouth 2 (two) times daily with a meal.  . metoprolol succinate (TOPROL-XL) 50 MG 24 hr tablet Take 50 mg by mouth daily.  . Multiple  Vitamins-Minerals (MULTIVITAMIN WITH MINERALS) tablet Take 1 tablet by mouth daily.  Marland Kitchen omeprazole (PRILOSEC) 20 MG capsule Take 1 capsule (20 mg total) by mouth daily.  . simvastatin (ZOCOR) 20 MG tablet Take 1 tablet (20 mg total) by mouth every evening.   BP 130/60  Pulse 64  Temp(Src) 97.7 F (36.5 C) (Oral)  Wt 208 lb (94.348 kg)  SpO2 97%  Review of Systems  Constitutional: Negative for fever, chills and unexpected weight change.  HENT: Positive for congestion, postnasal drip, rhinorrhea and sore throat. Negative for ear discharge, ear pain, facial swelling, hearing loss, mouth sores, nosebleeds, sinus pressure, sneezing, tinnitus, trouble swallowing and voice change.   Eyes: Negative for pain, discharge, redness and visual disturbance.  Respiratory: Positive for cough. Negative for chest tightness, shortness of breath, wheezing and stridor.   Cardiovascular: Negative for chest pain, palpitations and leg swelling.  Musculoskeletal: Negative for arthralgias, myalgias, neck pain and neck stiffness.  Skin: Negative for color change and rash.  Neurological: Negative for dizziness, weakness, light-headedness and headaches.  Hematological: Negative for adenopathy.       Objective:   Physical Exam  Constitutional: She is oriented to person, place, and time. She appears well-developed and well-nourished. No distress.  HENT:  Head: Normocephalic and atraumatic.  Right Ear: External ear normal.  Left Ear: External ear normal.  Nose: Nose normal.  Mouth/Throat: Oropharynx is clear and moist. No  oropharyngeal exudate.  Eyes: Conjunctivae are normal. Pupils are equal, round, and reactive to light. Right eye exhibits no discharge. Left eye exhibits no discharge. No scleral icterus.  Neck: Normal range of motion. Neck supple. No tracheal deviation present. No thyromegaly present.  Cardiovascular: Normal rate, regular rhythm, normal heart sounds and intact distal pulses.  Exam reveals no  gallop and no friction rub.   No murmur heard. Pulmonary/Chest: Effort normal. No accessory muscle usage. Not tachypneic. No respiratory distress. She has no decreased breath sounds. She has no wheezes. She has rhonchi in the right middle field and the right lower field. She has no rales. She exhibits no tenderness.  Musculoskeletal: Normal range of motion. She exhibits no edema and no tenderness.  Lymphadenopathy:    She has no cervical adenopathy.  Neurological: She is alert and oriented to person, place, and time. No cranial nerve deficit. She exhibits normal muscle tone. Coordination normal.  Skin: Skin is warm and dry. No rash noted. She is not diaphoretic. No erythema. No pallor.  Psychiatric: She has a normal mood and affect. Her behavior is normal. Judgment and thought content normal.          Assessment & Plan:

## 2013-09-01 NOTE — Assessment & Plan Note (Signed)
Symptoms and exam are consistent with acute bronchitis. Will continue Mucinex and add Tussionex as needed for cough. Will add Azithromycin. Pt has follow up in 1 week for recheck

## 2013-09-01 NOTE — Telephone Encounter (Signed)
Patient Information:  Caller Name: [place]  Phone: [phone]  Patient: Diane Mullins  Gender: Female  DOB: 1933-05-21  Age: 79 Years  PCP: Ronette Deter (Adults only)  Office Follow Up:  Does the office need to follow up with this patient?: No  Instructions For The Office: N/A  RN Note:  Patient c/o non productive cough.  No relief with Mucinex.  Requesting appointment.  Symptoms  Reason For Call & Symptoms: cough, fatigue,  Reviewed Health History In EMR: Yes  Reviewed Medications In EMR: Yes  Reviewed Allergies In EMR: Yes  Reviewed Surgeries / Procedures: Yes  Date of Onset of Symptoms: 08/29/2013  Treatments Tried: Mucinex  Treatments Tried Worked: No  Guideline(s) Used:  Cough  Disposition Per Guideline:   See Today or Tomorrow in Office  Reason For Disposition Reached:   Patient wants to be seen  Advice Given:  Call Back If:  You become worse.  Patient Will Follow Care Advice:  YES  Appointment Scheduled:  09/01/2013 11:45:29 Appointment Scheduled Provider:  Ronette Deter (Adults only)

## 2013-09-01 NOTE — Telephone Encounter (Signed)
FYI

## 2013-09-01 NOTE — Progress Notes (Signed)
Pre-visit discussion using our clinic review tool. No additional management support is needed unless otherwise documented below in the visit note.  

## 2013-09-04 ENCOUNTER — Other Ambulatory Visit: Payer: Self-pay | Admitting: Internal Medicine

## 2013-09-05 LAB — LIPID PANEL W/O CHOL/HDL RATIO
HDL: 50 mg/dL (ref >39)
Triglycerides: 194 mg/dL — ABNORMAL HIGH (ref 0–149)
VLDL Cholesterol Cal: 39 mg/dL (ref 5–40)

## 2013-09-05 LAB — COMPREHENSIVE METABOLIC PANEL
ALT: 15 IU/L (ref 0–32)
AST: 14 IU/L (ref 0–40)
Albumin/Globulin Ratio: 1.7 (ref 1.1–2.5)
Alkaline Phosphatase: 107 IU/L (ref 39–117)
CO2: 29 mmol/L (ref 18–29)
Calcium: 9.4 mg/dL (ref 8.6–10.2)
Chloride: 94 mmol/L — ABNORMAL LOW (ref 97–108)
GFR calc Af Amer: 73 mL/min/{1.73_m2} (ref >59)
GFR calc non Af Amer: 63 mL/min/{1.73_m2} (ref >59)
Potassium: 4 mmol/L (ref 3.5–5.2)
Sodium: 138 mmol/L (ref 134–144)

## 2013-09-05 LAB — MICROALBUMIN, URINE: Microalbumin, Urine: 4.4 ug/mL (ref 0.0–17.0)

## 2013-09-08 ENCOUNTER — Encounter: Payer: Self-pay | Admitting: Internal Medicine

## 2013-09-08 ENCOUNTER — Ambulatory Visit (INDEPENDENT_AMBULATORY_CARE_PROVIDER_SITE_OTHER): Payer: Medicare Other | Admitting: Internal Medicine

## 2013-09-08 VITALS — BP 134/76 | HR 81 | Temp 97.6°F | Wt 208.0 lb

## 2013-09-08 DIAGNOSIS — E119 Type 2 diabetes mellitus without complications: Secondary | ICD-10-CM

## 2013-09-08 DIAGNOSIS — J209 Acute bronchitis, unspecified: Secondary | ICD-10-CM

## 2013-09-08 MED ORDER — GLUCOSE BLOOD VI STRP: ORAL_STRIP | Status: DC

## 2013-09-08 NOTE — Patient Instructions (Signed)
Try changing Lantus to 15units in the morning. Monitor blood sugar 2-3 times daily, including at least 1 check of blood sugar 2 hours after a meal.  Please call if any blood sugars less than 70 or greater than 250.  Follow up in 4 weeks.

## 2013-09-08 NOTE — Progress Notes (Signed)
Pre visit review using our clinic review tool, if applicable. No additional management support is needed unless otherwise documented below in the visit note. 

## 2013-09-08 NOTE — Assessment & Plan Note (Signed)
Lab Results  Component Value Date   HGBA1C 9.6* 09/04/2013   BG markedly elevated, however with increase in Lantus to 20units daily, having some low morning BG <70. Will have her change Lantus dosing to qam and reduce dose of Lantus to 15units daily. Follow up 4 weeks.

## 2013-09-08 NOTE — Assessment & Plan Note (Signed)
Symptoms improved after Azithromycin. Will continue to monitor.

## 2013-09-08 NOTE — Progress Notes (Signed)
Subjective:    Patient ID: Diane Mullins, female    DOB: [birth year], 79 y.o.   MRN: [id number]  HPI 79 year old female with history of diabetes and recent episode of acute bronchitis presents for followup. In regards to bronchitis, she reports cough has resolved. No recurrent fever, chest pain, or other concerns.  In regards to diabetes, she recently was instructed to increase Lantus to 20 units daily. With increase in Lantus dosing, she developed some hypoglycemia with blood sugars less than 70 in the morning. She is compliant with her other medications. She is trying to follow a low sugar diet. She does not get regular exercise.  Outpatient Encounter Prescriptions as of 09/08/2013  Medication Sig  . Alcohol Swabs PADS Please use to cleanse skin before testing your blood sugars at home 1-2 times daily.  Marland Kitchen ALPRAZolam (XANAX) 0.25 MG tablet Take 1 tablet (0.25 mg total) by mouth 3 (three) times daily as needed for sleep or anxiety.  Marland Kitchen aspirin EC 81 MG tablet Take 81 mg by mouth daily.  . calcium-vitamin D (OSCAL) 250-125 MG-UNIT per tablet Take 1 tablet by mouth daily.  . fish oil-omega-3 fatty acids 1000 MG capsule Take 2 g by mouth daily.  . furosemide (LASIX) 20 MG tablet Take 20 mg by mouth daily.  Marland Kitchen glimepiride (AMARYL) 4 MG tablet Takes 1 tablet am and 1/2 tablet pm daily.  Marland Kitchen glucose blood (ACCU-CHEK AVIVA) test strip PLEASE USE AT HOME TO CHECK BLOOD SUGARS TWICE A DAY. DX CODE: 250.00  . glucose blood test strip Use as instructed  . Insulin Glargine (LANTUS SOLOSTAR) 100 UNIT/ML SOPN Inject 15 Units into the skin daily.  . Insulin Pen Needle (B-D UF III MINI PEN NEEDLES) 31G X 5 MM MISC Please use needles as directed once a day with your insulin pen. Dx code 250.00  . levothyroxine (SYNTHROID, LEVOTHROID) 75 MCG tablet Take 1 tablet (75 mcg total) by mouth daily.  Marland Kitchen losartan-hydrochlorothiazide (HYZAAR) 50-12.5 MG per tablet Take 1 tablet by mouth daily.  . metFORMIN (GLUCOPHAGE) 1000  MG tablet Take 1 tablet (1,000 mg total) by mouth 2 (two) times daily with a meal.  . metoprolol succinate (TOPROL-XL) 50 MG 24 hr tablet Take 50 mg by mouth daily.  . Multiple Vitamins-Minerals (MULTIVITAMIN WITH MINERALS) tablet Take 1 tablet by mouth daily.  Marland Kitchen omeprazole (PRILOSEC) 20 MG capsule Take 1 capsule (20 mg total) by mouth daily.  . simvastatin (ZOCOR) 20 MG tablet Take 1 tablet (20 mg total) by mouth every evening.   BP 134/76  Pulse 81  Temp(Src) 97.6 F (36.4 C) (Oral)  Wt 208 lb (94.348 kg)  SpO2 95%  Review of Systems  Constitutional: Negative for fever, chills, appetite change, fatigue and unexpected weight change.  HENT: Negative for congestion, ear pain, sinus pressure, sore throat, trouble swallowing and voice change.   Eyes: Negative for visual disturbance.  Respiratory: Positive for cough. Negative for shortness of breath, wheezing and stridor.   Cardiovascular: Negative for chest pain, palpitations and leg swelling.  Gastrointestinal: Negative for nausea, vomiting, abdominal pain, diarrhea, constipation, blood in stool, abdominal distention and anal bleeding.  Genitourinary: Negative for dysuria and flank pain.  Musculoskeletal: Negative for arthralgias, gait problem, myalgias and neck pain.  Skin: Negative for color change and rash.  Neurological: Negative for dizziness and headaches.  Hematological: Negative for adenopathy. Does not bruise/bleed easily.  Psychiatric/Behavioral: Negative for suicidal ideas, sleep disturbance and dysphoric mood. The patient is not nervous/anxious.  Objective:   Physical Exam  Constitutional: She is oriented to person, place, and time. She appears well-developed and well-nourished. No distress.  HENT:  Head: Normocephalic and atraumatic.  Right Ear: External ear normal.  Left Ear: External ear normal.  Nose: Nose normal.  Mouth/Throat: Oropharynx is clear and moist. No oropharyngeal exudate.  Eyes: Conjunctivae are  normal. Pupils are equal, round, and reactive to light. Right eye exhibits no discharge. Left eye exhibits no discharge. No scleral icterus.  Neck: Normal range of motion. Neck supple. No tracheal deviation present. No thyromegaly present.  Cardiovascular: Normal rate, regular rhythm, normal heart sounds and intact distal pulses.  Exam reveals no gallop and no friction rub.   No murmur heard. Pulmonary/Chest: Effort normal and breath sounds normal. No accessory muscle usage. Not tachypneic. No respiratory distress. She has no decreased breath sounds. She has no wheezes. She has no rhonchi. She has no rales. She exhibits no tenderness.  Musculoskeletal: Normal range of motion. She exhibits no edema and no tenderness.  Lymphadenopathy:    She has no cervical adenopathy.  Neurological: She is alert and oriented to person, place, and time. No cranial nerve deficit. She exhibits normal muscle tone. Coordination normal.  Skin: Skin is warm and dry. No rash noted. She is not diaphoretic. No erythema. No pallor.  Psychiatric: She has a normal mood and affect. Her behavior is normal. Judgment and thought content normal.          Assessment & Plan:

## 2013-10-02 ENCOUNTER — Other Ambulatory Visit: Payer: Self-pay | Admitting: *Deleted

## 2013-10-02 DIAGNOSIS — IMO0001 Reserved for inherently not codable concepts without codable children: Secondary | ICD-10-CM

## 2013-10-02 DIAGNOSIS — E1165 Type 2 diabetes mellitus with hyperglycemia: Principal | ICD-10-CM

## 2013-10-02 MED ORDER — INSULIN GLARGINE 100 UNIT/ML SOLOSTAR PEN: 15.0000 [IU] | PEN_INJECTOR | Freq: Every day | SUBCUTANEOUS | Status: DC

## 2013-10-02 NOTE — Telephone Encounter (Signed)
Patient left message stating she will run out of her insulin, would like to know if we had samples or could we just call her in 1 pen for now? Spoke with patient informed her we do not have any samples of the Lantus pen. Per patient she would like for [place] to call her one in to the [facility] pharmacy. Prescription sent to the pharmacy.

## 2013-10-29 ENCOUNTER — Telehealth: Payer: Self-pay | Admitting: *Deleted

## 2013-10-29 DIAGNOSIS — E039 Hypothyroidism, unspecified: Secondary | ICD-10-CM

## 2013-10-29 DIAGNOSIS — K219 Gastro-esophageal reflux disease without esophagitis: Secondary | ICD-10-CM

## 2013-10-29 DIAGNOSIS — I10 Essential (primary) hypertension: Secondary | ICD-10-CM

## 2013-10-29 DIAGNOSIS — E119 Type 2 diabetes mellitus without complications: Secondary | ICD-10-CM

## 2013-10-29 DIAGNOSIS — E785 Hyperlipidemia, unspecified: Secondary | ICD-10-CM

## 2013-10-29 NOTE — Telephone Encounter (Signed)
Left message on patient voicemail informing her to call office back since she did not leave the name of the medications she needs refills on and I do not see Onglyza on her current medication list. Please call back with this information

## 2013-10-29 NOTE — Telephone Encounter (Signed)
Patient left message on voicemail stating she need prescription refills sent to [facility] and requesting samples of Onglyza

## 2013-10-30 MED ORDER — LOSARTAN POTASSIUM-HCTZ 50-12.5 MG PO TABS: 1.0000 | ORAL_TABLET | Freq: Every day | ORAL | Status: DC

## 2013-10-30 MED ORDER — METOPROLOL SUCCINATE ER 50 MG PO TB24: 50.0000 mg | ORAL_TABLET | Freq: Every day | ORAL | Status: DC

## 2013-10-30 MED ORDER — LEVOTHYROXINE SODIUM 75 MCG PO TABS: 75.0000 ug | ORAL_TABLET | Freq: Every day | ORAL | Status: DC

## 2013-10-30 MED ORDER — METFORMIN HCL 1000 MG PO TABS: 1000.0000 mg | ORAL_TABLET | Freq: Two times a day (BID) | ORAL | Status: DC

## 2013-10-30 MED ORDER — SIMVASTATIN 20 MG PO TABS: 20.0000 mg | ORAL_TABLET | Freq: Every evening | ORAL | Status: DC

## 2013-10-30 MED ORDER — OMEPRAZOLE 20 MG PO CPDR: 20.0000 mg | DELAYED_RELEASE_CAPSULE | Freq: Every day | ORAL | Status: DC

## 2013-10-30 MED ORDER — GLIMEPIRIDE 4 MG PO TABS: ORAL_TABLET | ORAL | Status: DC

## 2013-10-30 MED ORDER — FUROSEMIDE 20 MG PO TABS: 20.0000 mg | ORAL_TABLET | Freq: Every day | ORAL | Status: DC

## 2013-10-30 NOTE — Telephone Encounter (Signed)
Spoke with patient and she has an appointment already scheduled on 3/6. Stated she was to finish up the [place] she already had at home then stop because it was so expensive. She would like the samples, informed her we have enough for 2 weeks and I will leave it up front for her to pick up. Fax all prescriptions to pharmacy with the exception of the [place], she will wait until she see Dr. Gilford Rile before sending it to the pharmacy due to the expense.

## 2013-10-30 NOTE — Telephone Encounter (Signed)
Patient is requesting Onglyza but it shows at a discontinued medication on her med list.

## 2013-10-30 NOTE — Telephone Encounter (Signed)
We should probable make an appointment with her to follow up and clarify her medications.

## 2013-11-18 ENCOUNTER — Telehealth: Payer: Self-pay | Admitting: *Deleted

## 2013-11-18 NOTE — Telephone Encounter (Signed)
Patient left a message stating she has an appointment scheduled for this Friday. Would like to know if she could get a [facility] requisition to have her labs done prior to her appointment. Please call her when ready to be picked up.

## 2013-11-18 NOTE — Telephone Encounter (Signed)
That is fine 

## 2013-11-18 NOTE — Telephone Encounter (Signed)
Left message on patient voicemail informing her form was at front desk for her to pick to have her labs done at [facility].

## 2013-11-19 LAB — HEMOGLOBIN A1C: Hgb A1c MFr Bld: 9.4 % — AB (ref 4.0–6.0)

## 2013-11-20 ENCOUNTER — Encounter: Payer: Self-pay | Admitting: *Deleted

## 2013-11-21 ENCOUNTER — Ambulatory Visit: Payer: Medicare Other | Admitting: Internal Medicine

## 2013-12-01 ENCOUNTER — Encounter: Payer: Self-pay | Admitting: Internal Medicine

## 2013-12-01 ENCOUNTER — Ambulatory Visit (INDEPENDENT_AMBULATORY_CARE_PROVIDER_SITE_OTHER): Payer: Commercial Managed Care - HMO | Admitting: Internal Medicine

## 2013-12-01 VITALS — BP 118/68 | HR 62 | Temp 97.7°F | Wt 209.0 lb

## 2013-12-01 DIAGNOSIS — E785 Hyperlipidemia, unspecified: Secondary | ICD-10-CM

## 2013-12-01 DIAGNOSIS — E1165 Type 2 diabetes mellitus with hyperglycemia: Principal | ICD-10-CM

## 2013-12-01 DIAGNOSIS — I10 Essential (primary) hypertension: Secondary | ICD-10-CM

## 2013-12-01 DIAGNOSIS — E039 Hypothyroidism, unspecified: Secondary | ICD-10-CM

## 2013-12-01 DIAGNOSIS — IMO0001 Reserved for inherently not codable concepts without codable children: Secondary | ICD-10-CM

## 2013-12-01 DIAGNOSIS — Z23 Encounter for immunization: Secondary | ICD-10-CM

## 2013-12-01 MED ORDER — INSULIN GLARGINE 100 UNIT/ML SOLOSTAR PEN: 20.0000 [IU] | PEN_INJECTOR | Freq: Every day | SUBCUTANEOUS | Status: DC

## 2013-12-01 NOTE — Assessment & Plan Note (Signed)
Lab Results  Component Value Date   HGBA1C 9.4* 11/19/2013   BG very poorly controlled. Pt unable to afford Onglyza. Will stop Onglyza. Will increase Lantus to 20units daily. Pt has completed diabetes education. Encouraged low glycemic diet and exercise. Follow up 4 week.

## 2013-12-01 NOTE — Assessment & Plan Note (Signed)
Lipids well controlled on recent labs. Continue Simvastatin.

## 2013-12-01 NOTE — Addendum Note (Signed)
Addended by: Wynonia Lawman E on: 12/01/2013 02:31 PM   Modules accepted: Orders

## 2013-12-01 NOTE — Assessment & Plan Note (Signed)
BP Readings from Last 3 Encounters:  12/01/13 118/68  09/08/13 134/76  09/01/13 130/60   BP well controlled on current medications. Will continue.

## 2013-12-01 NOTE — Assessment & Plan Note (Signed)
Recent TSH normal. Continue Levothyroxine. 

## 2013-12-01 NOTE — Progress Notes (Signed)
Pre visit review using our clinic review tool, if applicable. No additional management support is needed unless otherwise documented below in the visit note. 

## 2013-12-01 NOTE — Progress Notes (Signed)
Subjective:    Patient ID: Diane Mullins, female    DOB: [birth year], 80 y.o.   MRN: [id number]  HPI 80YO female presents for follow up.  DM - BG running near 100 in the morning. However, did not bring record today. Reports compliance with medications, however cannot afford Onglyza anymore.   Review of Systems  Constitutional: Negative for fever, chills, appetite change, fatigue and unexpected weight change.  HENT: Negative for congestion, ear pain, sinus pressure, sore throat, trouble swallowing and voice change.   Eyes: Negative for visual disturbance.  Respiratory: Negative for cough, shortness of breath, wheezing and stridor.   Cardiovascular: Negative for chest pain, palpitations and leg swelling.  Gastrointestinal: Negative for nausea, vomiting, abdominal pain, diarrhea, constipation, blood in stool, abdominal distention and anal bleeding.  Genitourinary: Negative for dysuria and flank pain.  Musculoskeletal: Negative for arthralgias, gait problem, myalgias and neck pain.  Skin: Negative for color change and rash.  Neurological: Negative for dizziness and headaches.  Hematological: Negative for adenopathy. Does not bruise/bleed easily.  Psychiatric/Behavioral: Negative for suicidal ideas, sleep disturbance and dysphoric mood. The patient is not nervous/anxious.        Objective:    BP 118/68  Pulse 62  Temp(Src) 97.7 F (36.5 C) (Oral)  Wt 209 lb (94.802 kg)  SpO2 97% Physical Exam  Constitutional: She is oriented to person, place, and time. She appears well-developed and well-nourished. No distress.  HENT:  Head: Normocephalic and atraumatic.  Right Ear: External ear normal.  Left Ear: External ear normal.  Nose: Nose normal.  Mouth/Throat: Oropharynx is clear and moist. No oropharyngeal exudate.  Eyes: Conjunctivae are normal. Pupils are equal, round, and reactive to light. Right eye exhibits no discharge. Left eye exhibits no discharge. No scleral icterus.  Neck:  Normal range of motion. Neck supple. No tracheal deviation present. No thyromegaly present.  Cardiovascular: Normal rate, regular rhythm, normal heart sounds and intact distal pulses.  Exam reveals no gallop and no friction rub.   No murmur heard. Pulmonary/Chest: Effort normal and breath sounds normal. No accessory muscle usage. Not tachypneic. No respiratory distress. She has no decreased breath sounds. She has no wheezes. She has no rhonchi. She has no rales. She exhibits no tenderness.  Musculoskeletal: Normal range of motion. She exhibits no edema and no tenderness.  Lymphadenopathy:    She has no cervical adenopathy.  Neurological: She is alert and oriented to person, place, and time. No cranial nerve deficit. She exhibits normal muscle tone. Coordination normal.  Skin: Skin is warm and dry. No rash noted. She is not diaphoretic. No erythema. No pallor.  Psychiatric: She has a normal mood and affect. Her behavior is normal. Judgment and thought content normal.          Assessment & Plan:   Problem List Items Addressed This Visit   Hyperlipidemia LDL goal < 70     Lipids well controlled on recent labs. Continue Simvastatin.    Hypertension      BP Readings from Last 3 Encounters:  12/01/13 118/68  09/08/13 134/76  09/01/13 130/60   BP well controlled on current medications. Will continue.    Hypothyroidism     Recent TSH normal. Continue Levothyroxine.    Type II or unspecified type diabetes mellitus without mention of complication, uncontrolled - Primary      Lab Results  Component Value Date   HGBA1C 9.4* 11/19/2013   BG very poorly controlled. Pt unable to afford Onglyza. Will stop  Onglyza. Will increase Lantus to 20units daily. Pt has completed diabetes education. Encouraged low glycemic diet and exercise. Follow up 4 week.    Relevant Medications      Insulin Glargine (LANTUS SOLOSTAR) 100 UNIT/ML Solostar Pen    Other Visit Diagnoses   Need for prophylactic  vaccination against Streptococcus pneumoniae (pneumococcus)        Relevant Orders       Pneumococcal conjugate vaccine 13-valent (Completed)        Return in about 4 weeks (around 12/29/2013) for Recheck of Diabetes.

## 2013-12-01 NOTE — Patient Instructions (Signed)
Increase Lantus to 20units daily.  Monitor blood sugar once in the morning before breakfast and once 1-2 hours after lunch.    Follow up 1 month.  Our goal is to get your A1c below 8%.

## 2013-12-02 ENCOUNTER — Telehealth: Payer: Self-pay | Admitting: Internal Medicine

## 2013-12-02 NOTE — Telephone Encounter (Signed)
Relevant patient education assigned to patient using Emmi. ° °

## 2013-12-10 ENCOUNTER — Telehealth: Payer: Self-pay

## 2013-12-10 NOTE — Telephone Encounter (Signed)
Relevant patient education mailed to patient.  

## 2013-12-15 ENCOUNTER — Telehealth: Payer: Self-pay | Admitting: Internal Medicine

## 2013-12-15 NOTE — Telephone Encounter (Signed)
Pt aware.

## 2013-12-15 NOTE — Telephone Encounter (Signed)
No referral needed as they are [facility]

## 2013-12-15 NOTE — Telephone Encounter (Signed)
Pt left vm.  States has appt w/ Dr. Rockey Situ 4/6 @ 2:00 p.m.  States has [employer] and has to have referral.  Asking for a call when this has been taken care of, can leave msg [phone]

## 2013-12-18 ENCOUNTER — Encounter: Payer: Self-pay | Admitting: *Deleted

## 2013-12-22 ENCOUNTER — Ambulatory Visit (INDEPENDENT_AMBULATORY_CARE_PROVIDER_SITE_OTHER): Payer: Commercial Managed Care - HMO | Admitting: Cardiovascular Disease

## 2013-12-22 ENCOUNTER — Encounter: Payer: Self-pay | Admitting: Cardiovascular Disease

## 2013-12-22 VITALS — BP 140/68 | HR 95 | Ht 61.0 in | Wt 207.5 lb

## 2013-12-22 DIAGNOSIS — E785 Hyperlipidemia, unspecified: Secondary | ICD-10-CM

## 2013-12-22 DIAGNOSIS — R000 Tachycardia, unspecified: Secondary | ICD-10-CM

## 2013-12-22 DIAGNOSIS — R269 Unspecified abnormalities of gait and mobility: Secondary | ICD-10-CM

## 2013-12-22 DIAGNOSIS — R0609 Other forms of dyspnea: Secondary | ICD-10-CM

## 2013-12-22 DIAGNOSIS — I10 Essential (primary) hypertension: Secondary | ICD-10-CM

## 2013-12-22 DIAGNOSIS — R0989 Other specified symptoms and signs involving the circulatory and respiratory systems: Secondary | ICD-10-CM

## 2013-12-22 NOTE — Assessment & Plan Note (Signed)
I suspect much of her shortness of breath is from her weight and deconditioning. Encouraged her to restart her exercise program

## 2013-12-22 NOTE — Assessment & Plan Note (Signed)
Unclear she is taking metoprolol once a day or twice a day. Suggested she call our office once she looks at her bottles at home. She did not bring these with her today

## 2013-12-22 NOTE — Assessment & Plan Note (Signed)
Encouraged her to stay on her simvastatin 

## 2013-12-22 NOTE — Assessment & Plan Note (Signed)
Long discussion about her weight, and the need for regular walking and exercise so she can stay in her house without having to get help. Encouraged her to restart her water aerobics

## 2013-12-22 NOTE — Patient Instructions (Addendum)
You are doing well. No medication changes were made.  Please check how much metoprolol succinate you are taking (one a day or two a day)  Try to get back into the water aerobics, Walk as much as possible  Please call us if you have new issues that need to be addressed before your next appt.  Your physician wants you to follow-up in: 6 months.  You will receive a reminder letter in the mail two months in advance. If you don't receive a letter, please call our office to schedule the follow-up appointment.

## 2013-12-22 NOTE — Assessment & Plan Note (Signed)
Blood pressure is well controlled on today's visit. No changes made to the medications. 

## 2013-12-22 NOTE — Progress Notes (Signed)
Patient ID: Diane Mullins, female    DOB: 1932/10/31, 80 y.o.   MRN: [id number]  HPI Comments: 80 year old female with history of diabetes, obesity,  hypertension, hyperlipidemia,  gait instability patient of Dr. Gilford Rile, history of tachycardia who presents for routine followup  In followup today, she reports that overall she is doing well but she has significant leg weakness.  She no longer does any water aerobics. She is sedentary, reports having significant leg weakness. Weight has been trending upwards. She likes to spend time with her extended family.  Difficult time controlling her sugars. Some dietary noncompliance Some shortness of breath with exertion, mild  Previous echocardiogram was essentially normal with ejection fraction greater than 55%. Total cholesterol 172 last year, on insulin Lasix daily with aspirin and losartan HCTZ She is uncertain if she is taking metoprolol once a day or twice a day  EKG shows sinus rhythm with rate 95 beats per minute beats per minute, nonspecific ST and T wave abnormality   Outpatient Encounter Prescriptions as of 12/22/2013  Medication Sig  . Alcohol Swabs PADS Please use to cleanse skin before testing your blood sugars at home 1-2 times daily.  Marland Kitchen ALPRAZolam (XANAX) 0.25 MG tablet Take 1 tablet (0.25 mg total) by mouth 3 (three) times daily as needed for sleep or anxiety.  Marland Kitchen aspirin EC 81 MG tablet Take 81 mg by mouth daily.  . calcium-vitamin D (OSCAL) 250-125 MG-UNIT per tablet Take 1 tablet by mouth daily.  . fish oil-omega-3 fatty acids 1000 MG capsule Take 2 g by mouth daily.  . furosemide (LASIX) 20 MG tablet Take 1 tablet (20 mg total) by mouth daily.  Marland Kitchen glimepiride (AMARYL) 4 MG tablet Takes 1 tablet am and 1/2 tablet pm daily.  Marland Kitchen glucose blood (ACCU-CHEK AVIVA) test strip PLEASE USE AT HOME TO CHECK BLOOD SUGARS TWICE A DAY. DX CODE: 250.00  . glucose blood test strip Use as instructed  . Insulin Glargine (LANTUS SOLOSTAR) 100 UNIT/ML  Solostar Pen Inject 20 Units into the skin daily.  . Insulin Pen Needle (B-D UF III MINI PEN NEEDLES) 31G X 5 MM MISC Please use needles as directed once a day with your insulin pen. Dx code 250.00  . levothyroxine (SYNTHROID, LEVOTHROID) 75 MCG tablet Take 1 tablet (75 mcg total) by mouth daily.  Marland Kitchen losartan-hydrochlorothiazide (HYZAAR) 50-12.5 MG per tablet Take 1 tablet by mouth daily.  . metFORMIN (GLUCOPHAGE) 1000 MG tablet Take 1 tablet (1,000 mg total) by mouth 2 (two) times daily with a meal.  . metoprolol succinate (TOPROL-XL) 50 MG 24 hr tablet Take 1 tablet (50 mg total) by mouth daily.  . Multiple Vitamins-Minerals (MULTIVITAMIN WITH MINERALS) tablet Take 1 tablet by mouth daily.  Marland Kitchen omeprazole (PRILOSEC) 20 MG capsule Take 1 capsule (20 mg total) by mouth daily.  . simvastatin (ZOCOR) 20 MG tablet Take 1 tablet (20 mg total) by mouth every evening.    Review of Systems  Constitutional: Negative.   HENT: Negative.   Eyes: Negative.   Respiratory: Positive for shortness of breath.   Gastrointestinal: Negative.   Endocrine: Negative.   Musculoskeletal: Positive for gait problem.  Skin: Negative.   Allergic/Immunologic: Negative.   Neurological: Negative.   Hematological: Negative.   Psychiatric/Behavioral: Negative.   All other systems reviewed and are negative.    BP 140/68  Pulse 95  Ht 5\' 1"  (1.549 m)  Wt 207 lb 8 oz (94.121 kg)  BMI 39.23 kg/m2  Physical Exam  Nursing  note and vitals reviewed. Constitutional: She is oriented to person, place, and time. She appears well-developed and well-nourished.  Obese  HENT:  Head: Normocephalic.  Nose: Nose normal.  Mouth/Throat: Oropharynx is clear and moist.  Eyes: Conjunctivae are normal. Pupils are equal, round, and reactive to light.  Neck: Normal range of motion. Neck supple. No JVD present.  Cardiovascular: Regular rhythm, S1 normal, S2 normal, normal heart sounds and intact distal pulses.  Tachycardia present.   Exam reveals no gallop and no friction rub.   No murmur heard. Pulmonary/Chest: Effort normal and breath sounds normal. No respiratory distress. She has no wheezes. She has no rales. She exhibits no tenderness.  Abdominal: Soft. Bowel sounds are normal. She exhibits no distension. There is no tenderness.  Musculoskeletal: Normal range of motion. She exhibits no edema and no tenderness.  Lymphadenopathy:    She has no cervical adenopathy.  Neurological: She is alert and oriented to person, place, and time. Coordination normal.  Skin: Skin is warm and dry. No rash noted. No erythema.  Psychiatric: She has a normal mood and affect. Her behavior is normal. Judgment and thought content normal.    Assessment and Plan

## 2013-12-24 ENCOUNTER — Other Ambulatory Visit: Payer: Self-pay | Admitting: *Deleted

## 2013-12-24 ENCOUNTER — Telehealth: Payer: Self-pay

## 2013-12-24 DIAGNOSIS — E119 Type 2 diabetes mellitus without complications: Secondary | ICD-10-CM

## 2013-12-24 MED ORDER — GLUCOSE BLOOD VI STRP: ORAL_STRIP | Status: DC

## 2013-12-24 NOTE — Telephone Encounter (Signed)
Pt called back Metoprolol dosage is "take one my mouth twice daily" Pt has cut back to 1 1/2 in the a.m and none at night.

## 2013-12-24 NOTE — Telephone Encounter (Signed)
Patient called stating she needs a refill on her diabetic test strips. Accu-chek Aviva Plus. Prescription faxed.

## 2013-12-24 NOTE — Telephone Encounter (Signed)
How many mg?

## 2013-12-25 NOTE — Telephone Encounter (Signed)
Left message for pt to call back  °

## 2013-12-26 NOTE — Telephone Encounter (Signed)
Pt left message on vm that she is taking metoprolol 50mg , 1.5 tabs daily.

## 2013-12-26 NOTE — Telephone Encounter (Signed)
She might be able to increase up to 100 mg daily and his heart rate and blood pressure were mildly elevated This can be sent in as 100 mg pill once a day if she would like

## 2013-12-29 ENCOUNTER — Other Ambulatory Visit: Payer: Self-pay | Admitting: Internal Medicine

## 2013-12-29 MED ORDER — METOPROLOL SUCCINATE ER 100 MG PO TB24: 100.0000 mg | ORAL_TABLET | Freq: Every day | ORAL | Status: DC

## 2013-12-29 NOTE — Telephone Encounter (Signed)
Spoke w/ pt.  Advised her of Dr. Donivan Scull recommendation. She reports that she just had her rx refilled for metoprolol 50mg  and would like to take BID until she runs out of this.

## 2013-12-30 ENCOUNTER — Ambulatory Visit (INDEPENDENT_AMBULATORY_CARE_PROVIDER_SITE_OTHER): Payer: Commercial Managed Care - HMO | Admitting: Internal Medicine

## 2013-12-30 ENCOUNTER — Encounter: Payer: Self-pay | Admitting: Internal Medicine

## 2013-12-30 VITALS — BP 114/60 | HR 61 | Temp 97.9°F | Wt 210.0 lb

## 2013-12-30 DIAGNOSIS — IMO0001 Reserved for inherently not codable concepts without codable children: Secondary | ICD-10-CM

## 2013-12-30 DIAGNOSIS — E785 Hyperlipidemia, unspecified: Secondary | ICD-10-CM

## 2013-12-30 DIAGNOSIS — I10 Essential (primary) hypertension: Secondary | ICD-10-CM

## 2013-12-30 DIAGNOSIS — E1165 Type 2 diabetes mellitus with hyperglycemia: Principal | ICD-10-CM

## 2013-12-30 NOTE — Assessment & Plan Note (Signed)
BG improved on higher dose of Lantus. Will continue. Recheck A1c in 02/2014.

## 2013-12-30 NOTE — Progress Notes (Signed)
   Subjective:    Patient ID: Diane Mullins, female    DOB: 02-17-1933, 80 y.o.   MRN: [id number]  HPI 80YO female presents for follow up.  DM - recently increased Lantus to 20units daily. BGs running near 70s to 150s, mostly 80-90s. Compliant with meds. Highest BG 225.  Review of Systems  Constitutional: Negative for fever, chills, appetite change, fatigue and unexpected weight change.  HENT: Negative for congestion, ear pain, sinus pressure, sore throat, trouble swallowing and voice change.   Eyes: Negative for visual disturbance.  Respiratory: Negative for cough, shortness of breath, wheezing and stridor.   Cardiovascular: Negative for chest pain, palpitations and leg swelling.  Gastrointestinal: Negative for nausea, vomiting, abdominal pain, diarrhea, constipation, blood in stool, abdominal distention and anal bleeding.  Genitourinary: Negative for dysuria and flank pain.  Musculoskeletal: Negative for arthralgias, gait problem, myalgias and neck pain.  Skin: Negative for color change and rash.  Neurological: Negative for dizziness and headaches.  Hematological: Negative for adenopathy. Does not bruise/bleed easily.  Psychiatric/Behavioral: Negative for suicidal ideas, sleep disturbance and dysphoric mood. The patient is not nervous/anxious.        Objective:    BP 114/60  Pulse 61  Temp(Src) 97.9 F (36.6 C) (Oral)  Wt 210 lb (95.255 kg)  SpO2 97% Physical Exam  Constitutional: She is oriented to person, place, and time. She appears well-developed and well-nourished. No distress.  HENT:  Head: Normocephalic and atraumatic.  Right Ear: External ear normal.  Left Ear: External ear normal.  Nose: Nose normal.  Mouth/Throat: Oropharynx is clear and moist. No oropharyngeal exudate.  Eyes: Conjunctivae are normal. Pupils are equal, round, and reactive to light. Right eye exhibits no discharge. Left eye exhibits no discharge. No scleral icterus.  Neck: Normal range of motion.  Neck supple. No tracheal deviation present. No thyromegaly present.  Cardiovascular: Normal rate, regular rhythm, normal heart sounds and intact distal pulses.  Exam reveals no gallop and no friction rub.   No murmur heard. Pulmonary/Chest: Effort normal and breath sounds normal. No accessory muscle usage. Not tachypneic. No respiratory distress. She has no decreased breath sounds. She has no wheezes. She has no rhonchi. She has no rales. She exhibits no tenderness.  Musculoskeletal: Normal range of motion. She exhibits no edema and no tenderness.  Lymphadenopathy:    She has no cervical adenopathy.  Neurological: She is alert and oriented to person, place, and time. No cranial nerve deficit. She exhibits normal muscle tone. Coordination normal.  Skin: Skin is warm and dry. No rash noted. She is not diaphoretic. No erythema. No pallor.  Psychiatric: She has a normal mood and affect. Her behavior is normal. Judgment and thought content normal.          Assessment & Plan:   Problem List Items Addressed This Visit   None       No Follow-up on file.

## 2013-12-30 NOTE — Assessment & Plan Note (Signed)
BP Readings from Last 3 Encounters:  12/30/13 114/60  12/22/13 140/68  12/01/13 118/68   BP well controlled on current medications. Will continue.

## 2014-01-03 ENCOUNTER — Other Ambulatory Visit: Payer: Self-pay | Admitting: Internal Medicine

## 2014-01-08 ENCOUNTER — Encounter: Payer: Self-pay | Admitting: Internal Medicine

## 2014-03-04 ENCOUNTER — Ambulatory Visit (INDEPENDENT_AMBULATORY_CARE_PROVIDER_SITE_OTHER): Payer: Commercial Managed Care - HMO | Admitting: Internal Medicine

## 2014-03-04 ENCOUNTER — Encounter: Payer: Self-pay | Admitting: Internal Medicine

## 2014-03-04 VITALS — BP 136/60 | HR 63 | Temp 97.7°F | Ht 60.5 in | Wt 208.2 lb

## 2014-03-04 DIAGNOSIS — E1165 Type 2 diabetes mellitus with hyperglycemia: Principal | ICD-10-CM

## 2014-03-04 DIAGNOSIS — I10 Essential (primary) hypertension: Secondary | ICD-10-CM

## 2014-03-04 DIAGNOSIS — E039 Hypothyroidism, unspecified: Secondary | ICD-10-CM

## 2014-03-04 DIAGNOSIS — IMO0001 Reserved for inherently not codable concepts without codable children: Secondary | ICD-10-CM

## 2014-03-04 DIAGNOSIS — R269 Unspecified abnormalities of gait and mobility: Secondary | ICD-10-CM

## 2014-03-04 DIAGNOSIS — Z7189 Other specified counseling: Secondary | ICD-10-CM

## 2014-03-04 MED ORDER — INSULIN GLARGINE 100 UNIT/ML SOLOSTAR PEN: 15.0000 [IU] | PEN_INJECTOR | Freq: Two times a day (BID) | SUBCUTANEOUS | Status: DC

## 2014-03-04 MED ORDER — FUROSEMIDE 20 MG PO TABS: 20.0000 mg | ORAL_TABLET | Freq: Every day | ORAL | Status: DC

## 2014-03-04 MED ORDER — METOPROLOL SUCCINATE ER 100 MG PO TB24: 100.0000 mg | ORAL_TABLET | Freq: Every day | ORAL | Status: DC

## 2014-03-04 NOTE — Assessment & Plan Note (Signed)
BP Readings from Last 3 Encounters:  03/04/14 136/60  12/30/13 114/60  12/22/13 140/68   BP well controlled on current medications. Recent renal function normal. Continue current medication.

## 2014-03-04 NOTE — Assessment & Plan Note (Signed)
Discussion today with pt regarding DNR status. Reviewed her living will. DNR filled out for her to place on refrigerator.

## 2014-03-04 NOTE — Assessment & Plan Note (Signed)
Some gait instability with OA bilateral knees. Rx written for cane for her to use today.

## 2014-03-04 NOTE — Patient Instructions (Signed)
Increase Lantus to 15units twice daily.  Monitor blood sugars 2-3 times daily.  Return to clinic in 4 weeks or sooner as needed.

## 2014-03-04 NOTE — Assessment & Plan Note (Signed)
Pt reports BG well controlled however A1c continues to be elevated, 9.1%. Will have her increase Lantus to 15units bid. Set up endocrinology evaluation. Continue Metformin and Glimepiride. Follow up in 4 weeks.

## 2014-03-04 NOTE — Progress Notes (Signed)
Subjective:    Patient ID: Diane Mullins, female    DOB: 1933/09/11, 80 y.o.   MRN: [id number]  HPI 80YO female presents for follow up.  DM - Reports good control of BG, but did not bring record. Recent A1c from [facility] was 9.1%, which is down slightly from previous value of 9.5%. Compliant with Lantus 20units at bedtime. Has some low BG in the morning near 70. Tries to follow healthy diet.  Would like to set up DNR status. Has a living will in place. Has discussed this with her children.  Also notes some instability of her gait. No recent falls. Would like to get a cane to use when walking.  Review of Systems  Constitutional: Negative for fever, chills, appetite change, fatigue and unexpected weight change.  HENT: Negative for congestion, ear pain, sinus pressure, sore throat, trouble swallowing and voice change.   Eyes: Negative for visual disturbance.  Respiratory: Negative for cough, shortness of breath, wheezing and stridor.   Cardiovascular: Negative for chest pain, palpitations and leg swelling.  Gastrointestinal: Negative for nausea, vomiting, abdominal pain, diarrhea, constipation, blood in stool, abdominal distention and anal bleeding.  Genitourinary: Negative for dysuria and flank pain.  Musculoskeletal: Positive for arthralgias (knees) and gait problem. Negative for myalgias and neck pain.  Skin: Negative for color change and rash.  Neurological: Negative for dizziness and headaches.  Hematological: Negative for adenopathy. Does not bruise/bleed easily.  Psychiatric/Behavioral: Negative for suicidal ideas, sleep disturbance and dysphoric mood. The patient is not nervous/anxious.        Objective:    BP 136/60  Pulse 63  Temp(Src) 97.7 F (36.5 C) (Oral)  Ht 5' 0.5" (1.537 m)  Wt 208 lb 4 oz (94.462 kg)  BMI 39.99 kg/m2  SpO2 97% Physical Exam  Constitutional: She is oriented to person, place, and time. She appears well-developed and well-nourished. No distress.    HENT:  Head: Normocephalic and atraumatic.  Right Ear: External ear normal.  Left Ear: External ear normal.  Nose: Nose normal.  Mouth/Throat: Oropharynx is clear and moist. No oropharyngeal exudate.  Eyes: Conjunctivae are normal. Pupils are equal, round, and reactive to light. Right eye exhibits no discharge. Left eye exhibits no discharge. No scleral icterus.  Neck: Normal range of motion. Neck supple. No tracheal deviation present. No thyromegaly present.  Cardiovascular: Normal rate, regular rhythm, normal heart sounds and intact distal pulses.  Exam reveals no gallop and no friction rub.   No murmur heard. Pulmonary/Chest: Effort normal and breath sounds normal. No accessory muscle usage. Not tachypneic. No respiratory distress. She has no decreased breath sounds. She has no wheezes. She has no rhonchi. She has no rales. She exhibits no tenderness.  Musculoskeletal: She exhibits no edema and no tenderness.       Right knee: She exhibits decreased range of motion.       Left knee: She exhibits decreased range of motion.  Lymphadenopathy:    She has no cervical adenopathy.  Neurological: She is alert and oriented to person, place, and time. No cranial nerve deficit. She exhibits normal muscle tone. Gait (slow, limited by pain in bilateral knees) abnormal. Coordination normal.  Skin: Skin is warm and dry. No rash noted. She is not diaphoretic. No erythema. No pallor.  Psychiatric: She has a normal mood and affect. Her behavior is normal. Judgment and thought content normal.          Assessment & Plan:   Problem List Items Addressed This  Visit     Unprioritized   DNR (do not resuscitate) discussion     Discussion today with pt regarding DNR status. Reviewed her living will. DNR filled out for her to place on refrigerator.    Gait disturbance     Some gait instability with OA bilateral knees. Rx written for cane for her to use today.    Hypertension      BP Readings from Last  3 Encounters:  03/04/14 136/60  12/30/13 114/60  12/22/13 140/68   BP well controlled on current medications. Recent renal function normal. Continue current medication.    Relevant Medications      metoprolol succinate (TOPROL-XL) 24 hr tablet      furosemide (LASIX) tablet   Hypothyroidism   Relevant Medications      metoprolol succinate (TOPROL-XL) 24 hr tablet   Type II or unspecified type diabetes mellitus without mention of complication, uncontrolled - Primary     Pt reports BG well controlled however A1c continues to be elevated, 9.1%. Will have her increase Lantus to 15units bid. Set up endocrinology evaluation. Continue Metformin and Glimepiride. Follow up in 4 weeks.    Relevant Medications      Insulin Glargine (LANTUS SOLOSTAR) 100 UNIT/ML Solostar Pen   Other Relevant Orders      Ambulatory referral to Endocrinology       Return in about 4 weeks (around 04/01/2014) for Recheck of Diabetes.

## 2014-03-04 NOTE — Progress Notes (Signed)
Pre visit review using our clinic review tool, if applicable. No additional management support is needed unless otherwise documented below in the visit note. 

## 2014-03-16 ENCOUNTER — Encounter: Payer: Self-pay | Admitting: Internal Medicine

## 2014-04-01 ENCOUNTER — Encounter: Payer: Self-pay | Admitting: Internal Medicine

## 2014-04-01 ENCOUNTER — Ambulatory Visit (INDEPENDENT_AMBULATORY_CARE_PROVIDER_SITE_OTHER): Payer: Commercial Managed Care - HMO | Admitting: Internal Medicine

## 2014-04-01 ENCOUNTER — Ambulatory Visit (INDEPENDENT_AMBULATORY_CARE_PROVIDER_SITE_OTHER)
Admission: RE | Admit: 2014-04-01 | Discharge: 2014-04-01 | Disposition: A | Discharge status: Home / Self Care | Payer: Commercial Managed Care - HMO | Source: Ambulatory Visit | Attending: Internal Medicine | Admitting: Internal Medicine

## 2014-04-01 VITALS — BP 124/68 | HR 94 | Temp 97.8°F | Ht 60.5 in | Wt 208.5 lb

## 2014-04-01 DIAGNOSIS — M79604 Pain in right leg: Secondary | ICD-10-CM

## 2014-04-01 DIAGNOSIS — E1165 Type 2 diabetes mellitus with hyperglycemia: Principal | ICD-10-CM

## 2014-04-01 DIAGNOSIS — M79609 Pain in unspecified limb: Secondary | ICD-10-CM

## 2014-04-01 DIAGNOSIS — IMO0001 Reserved for inherently not codable concepts without codable children: Secondary | ICD-10-CM

## 2014-04-01 MED ORDER — INSULIN GLARGINE 100 UNIT/ML SOLOSTAR PEN: PEN_INJECTOR | SUBCUTANEOUS | Status: DC

## 2014-04-01 NOTE — Progress Notes (Signed)
Subjective:    Patient ID: Diane Mullins, female    DOB: 01-03-1933, 80 y.o.   MRN: [id number]  HPI 80YO female presents for follow up.  DM - BG mostly near 100. 5 episodes of BG near 60, all in the mornings. Compliant with medication.  Right leg pain - severe pain over anterior right lower leg and occasionally in right knee over last few days. No trauma. Has h/o stress fracture right leg in distant past. Not taking anything for pain. No weakness or numbness noted.  Review of Systems  Constitutional: Negative for fever, chills, appetite change, fatigue and unexpected weight change.  Eyes: Negative for visual disturbance.  Respiratory: Negative for shortness of breath.   Cardiovascular: Negative for chest pain and leg swelling.  Gastrointestinal: Negative for nausea, vomiting, abdominal pain, diarrhea and constipation.  Musculoskeletal: Positive for arthralgias and myalgias. Negative for back pain and gait problem.  Skin: Negative for color change and rash.  Neurological: Negative for weakness and numbness.  Hematological: Negative for adenopathy. Does not bruise/bleed easily.  Psychiatric/Behavioral: Negative for dysphoric mood. The patient is not nervous/anxious.        Objective:    BP 124/68  Pulse 94  Temp(Src) 97.8 F (36.6 C) (Oral)  Ht 5' 0.5" (1.537 m)  Wt 208 lb 8 oz (94.575 kg)  BMI 40.03 kg/m2  SpO2 94% Physical Exam  Constitutional: She is oriented to person, place, and time. She appears well-developed and well-nourished. No distress.  HENT:  Head: Normocephalic and atraumatic.  Right Ear: External ear normal.  Left Ear: External ear normal.  Nose: Nose normal.  Mouth/Throat: Oropharynx is clear and moist. No oropharyngeal exudate.  Eyes: Conjunctivae are normal. Pupils are equal, round, and reactive to light. Right eye exhibits no discharge. Left eye exhibits no discharge. No scleral icterus.  Neck: Normal range of motion. Neck supple. No tracheal deviation  present. No thyromegaly present.  Cardiovascular: Normal rate, regular rhythm, normal heart sounds and intact distal pulses.  Exam reveals no gallop and no friction rub.   No murmur heard. Pulmonary/Chest: Effort normal and breath sounds normal. No accessory muscle usage. Not tachypneic. No respiratory distress. She has no decreased breath sounds. She has no wheezes. She has no rhonchi. She has no rales. She exhibits no tenderness.  Musculoskeletal: Normal range of motion. She exhibits no edema and no tenderness.       Right knee: She exhibits normal range of motion and no swelling. No tenderness found.       Legs: Lymphadenopathy:    She has no cervical adenopathy.  Neurological: She is alert and oriented to person, place, and time. No cranial nerve deficit. She exhibits normal muscle tone. Coordination normal.  Skin: Skin is warm and dry. No rash noted. She is not diaphoretic. No erythema. No pallor.  Psychiatric: She has a normal mood and affect. Her behavior is normal. Judgment and thought content normal.          Assessment & Plan:   Problem List Items Addressed This Visit     Unprioritized   Right leg pain     Right lower leg pain anterior shin. No palpable abnormalities on exam.Will get plain xray for evaluation given h/o stress fracture in the past.    Relevant Orders      DG Tibia/Fibula Right      DG Knee Complete 4 Views Right   Type II or unspecified type diabetes mellitus without mention of complication, uncontrolled - Primary  BG have been running low in the morning. Will decrease evening Lantus to 10units. Continue morning Lantus 15units. Follow up with repeat A1c in 05/2014. Call if any BG<70.    Relevant Medications      Insulin Glargine (LANTUS SOLOSTAR) 100 UNIT/ML Solostar Pen       Return in about 2 months (around 06/02/2014) for Recheck of Diabetes.

## 2014-04-01 NOTE — Assessment & Plan Note (Signed)
BG have been running low in the morning. Will decrease evening Lantus to 10units. Continue morning Lantus 15units. Follow up with repeat A1c in 05/2014. Call if any BG<70.

## 2014-04-01 NOTE — Progress Notes (Signed)
Pre visit review using our clinic review tool, if applicable. No additional management support is needed unless otherwise documented below in the visit note. 

## 2014-04-01 NOTE — Assessment & Plan Note (Signed)
Right lower leg pain anterior shin. No palpable abnormalities on exam.Will get plain xray for evaluation given h/o stress fracture in the past.

## 2014-04-01 NOTE — Patient Instructions (Addendum)
Decrease Lantus to 15units in the morning and 10units at night. Call if any blood sugars less than 70.  Please have labs checked in early September.  Follow up in 2 months.

## 2014-04-02 ENCOUNTER — Other Ambulatory Visit: Payer: Self-pay | Admitting: Internal Medicine

## 2014-04-02 DIAGNOSIS — M79604 Pain in right leg: Secondary | ICD-10-CM

## 2014-04-08 LAB — HM MAMMOGRAPHY: HM MAMMO: NEGATIVE

## 2014-04-09 ENCOUNTER — Encounter: Payer: Self-pay | Admitting: *Deleted

## 2014-05-01 ENCOUNTER — Telehealth: Payer: Self-pay

## 2014-05-01 NOTE — Telephone Encounter (Signed)
The patient is scheduled for her yearly eye exam with Dr.Syndor on Tuesday, August 18 This apt has been approved by [employer] Staggers [id number]  (good for 6 visits)

## 2014-05-03 LAB — HM DIABETES EYE EXAM

## 2014-06-02 LAB — BASIC METABOLIC PANEL
BUN: 15 mg/dL (ref 4–21)
CREATININE: 0.9 mg/dL (ref 0.5–1.1)
Glucose: 97 mg/dL
Potassium: 4.4 mmol/L (ref 3.4–5.3)
SODIUM: 140 mmol/L (ref 137–147)

## 2014-06-02 LAB — HEPATIC FUNCTION PANEL
ALT: 17 U/L (ref 7–35)
AST: 14 U/L (ref 13–35)
Alkaline Phosphatase: 96 U/L (ref 25–125)

## 2014-06-02 LAB — LIPID PANEL
Cholesterol: 192 mg/dL (ref 0–200)
HDL: 49 mg/dL (ref 35–70)
LDL CALC: 106 mg/dL
TRIGLYCERIDES: 183 mg/dL — AB (ref 40–160)

## 2014-06-02 LAB — HEMOGLOBIN A1C: HEMOGLOBIN A1C: 8.6 % — AB (ref 4.0–6.0)

## 2014-06-03 ENCOUNTER — Encounter: Payer: Self-pay | Admitting: Internal Medicine

## 2014-06-03 ENCOUNTER — Ambulatory Visit (INDEPENDENT_AMBULATORY_CARE_PROVIDER_SITE_OTHER): Payer: Commercial Managed Care - HMO | Admitting: Internal Medicine

## 2014-06-03 VITALS — BP 136/62 | HR 79 | Temp 98.2°F | Ht 60.5 in | Wt 208.5 lb

## 2014-06-03 DIAGNOSIS — Z23 Encounter for immunization: Secondary | ICD-10-CM

## 2014-06-03 DIAGNOSIS — Z780 Asymptomatic menopausal state: Secondary | ICD-10-CM

## 2014-06-03 DIAGNOSIS — E785 Hyperlipidemia, unspecified: Secondary | ICD-10-CM

## 2014-06-03 DIAGNOSIS — I10 Essential (primary) hypertension: Secondary | ICD-10-CM

## 2014-06-03 DIAGNOSIS — IMO0001 Reserved for inherently not codable concepts without codable children: Secondary | ICD-10-CM

## 2014-06-03 DIAGNOSIS — E1165 Type 2 diabetes mellitus with hyperglycemia: Principal | ICD-10-CM

## 2014-06-03 LAB — HM DIABETES FOOT EXAM: HM Diabetic Foot Exam: NORMAL

## 2014-06-03 MED ORDER — INSULIN GLARGINE 100 UNIT/ML SOLOSTAR PEN: PEN_INJECTOR | SUBCUTANEOUS | Status: DC

## 2014-06-03 NOTE — Progress Notes (Signed)
Subjective:    Patient ID: Diane Mullins, female    DOB: [birth year], 80 y.o.   MRN: [id number]  HPI 80YO female presents for follow up.  DM - Frequent low BG less than 70. Symptomatic with sweating. Improves with taking orange juice.  Otherwise, feeling well. No other concerns today.   Review of Systems  Constitutional: Negative for fever, chills, appetite change, fatigue and unexpected weight change.  Eyes: Negative for visual disturbance.  Respiratory: Negative for shortness of breath.   Cardiovascular: Negative for chest pain and leg swelling.  Gastrointestinal: Negative for nausea, vomiting, abdominal pain, diarrhea and constipation.  Musculoskeletal: Negative for arthralgias and myalgias.  Skin: Negative for color change and rash.  Hematological: Negative for adenopathy. Does not bruise/bleed easily.  Psychiatric/Behavioral: Negative for dysphoric mood. The patient is not nervous/anxious.        Objective:    BP 136/62  Pulse 79  Temp(Src) 98.2 F (36.8 C) (Oral)  Ht 5' 0.5" (1.537 m)  Wt 208 lb 8 oz (94.575 kg)  BMI 40.03 kg/m2  SpO2 95% Physical Exam  Constitutional: She is oriented to person, place, and time. She appears well-developed and well-nourished. No distress.  HENT:  Head: Normocephalic and atraumatic.  Right Ear: External ear normal.  Left Ear: External ear normal.  Nose: Nose normal.  Mouth/Throat: Oropharynx is clear and moist. No oropharyngeal exudate.  Eyes: Conjunctivae are normal. Pupils are equal, round, and reactive to light. Right eye exhibits no discharge. Left eye exhibits no discharge. No scleral icterus.  Neck: Normal range of motion. Neck supple. No tracheal deviation present. No thyromegaly present.  Cardiovascular: Normal rate, regular rhythm, normal heart sounds and intact distal pulses.  Exam reveals no gallop and no friction rub.   No murmur heard. Pulmonary/Chest: Effort normal and breath sounds normal. No accessory muscle usage.  Not tachypneic. No respiratory distress. She has no decreased breath sounds. She has no wheezes. She has no rhonchi. She has no rales. She exhibits no tenderness.  Musculoskeletal: Normal range of motion. She exhibits no edema and no tenderness.  Lymphadenopathy:    She has no cervical adenopathy.  Neurological: She is alert and oriented to person, place, and time. No cranial nerve deficit. She exhibits normal muscle tone. Coordination normal.  Skin: Skin is warm and dry. No rash noted. She is not diaphoretic. No erythema. No pallor.  Psychiatric: She has a normal mood and affect. Her behavior is normal. Judgment and thought content normal.          Assessment & Plan:   Problem List Items Addressed This Visit     Unprioritized   Hypertension      BP Readings from Last 3 Encounters:  06/03/14 136/62  04/01/14 124/68  03/04/14 136/60   BP well controlled on current medications. Will continue.    Other and unspecified hyperlipidemia     Will check lipids with labs. Continue Simvastatin.    Postmenopausal estrogen deficiency     Bone density testing ordered.    Relevant Orders      DG Bone Density   Type II or unspecified type diabetes mellitus without mention of complication, uncontrolled - Primary     Frequent low BG. Will hold evening dose of Lantus. Continue Lantus 15units Qam and Glimepiride, metformin. Will request labs from [facility].    Relevant Medications      Insulin Glargine (LANTUS SOLOSTAR) 100 UNIT/ML Solostar Pen       Return in about 3 months (  around 09/02/2014) for Recheck.

## 2014-06-03 NOTE — Assessment & Plan Note (Signed)
BP Readings from Last 3 Encounters:  06/03/14 136/62  04/01/14 124/68  03/04/14 136/60   BP well controlled on current medications. Will continue.

## 2014-06-03 NOTE — Patient Instructions (Signed)
Please stop your nighttime dose of Lantus.  Continue 15units of Lantus in the morning.  Call if you continue to have blood sugars less than 70.  Follow up in 3 months or sooner as needed.

## 2014-06-03 NOTE — Assessment & Plan Note (Signed)
Bone density testing ordered. 

## 2014-06-03 NOTE — Progress Notes (Signed)
Pre visit review using our clinic review tool, if applicable. No additional management support is needed unless otherwise documented below in the visit note. 

## 2014-06-03 NOTE — Addendum Note (Signed)
Addended by: Vernetta Honey on: 06/03/2014 09:41 AM   Modules accepted: Orders

## 2014-06-03 NOTE — Assessment & Plan Note (Signed)
Frequent low BG. Will hold evening dose of Lantus. Continue Lantus 15units Qam and Glimepiride, metformin. Will request labs from [facility].

## 2014-06-03 NOTE — Assessment & Plan Note (Signed)
Will check lipids with labs. Continue Simvastatin. 

## 2014-06-08 ENCOUNTER — Encounter: Payer: Self-pay | Admitting: *Deleted

## 2014-07-16 ENCOUNTER — Telehealth: Payer: Self-pay | Admitting: Internal Medicine

## 2014-07-16 NOTE — Telephone Encounter (Signed)
Needs to go to urgent care today.

## 2014-07-16 NOTE — Telephone Encounter (Signed)
Ms. Ratts called saying she feels weak, nauceous, dizzy, with no energy. She's felt this way for 6days. She'd like to be seen if possible. I didn't see anything on Dr. Thomes Dinning schedule. Please call the pt to let her know if there's anything she can do regarding her symptoms. Pt ph# [phone] Thank you.

## 2014-07-16 NOTE — Telephone Encounter (Signed)
Please see below note

## 2014-07-16 NOTE — Telephone Encounter (Signed)
Spoke to patient, does not want to go to Urgent Care today. Appointment scheduled at [facility] for tomorrow.

## 2014-07-17 ENCOUNTER — Encounter: Payer: Self-pay | Admitting: Family Medicine

## 2014-07-17 ENCOUNTER — Ambulatory Visit (INDEPENDENT_AMBULATORY_CARE_PROVIDER_SITE_OTHER): Payer: Commercial Managed Care - HMO | Admitting: Family Medicine

## 2014-07-17 VITALS — BP 136/70 | HR 84 | Temp 98.5°F | Ht 60.5 in | Wt 207.5 lb

## 2014-07-17 DIAGNOSIS — H8110 Benign paroxysmal vertigo, unspecified ear: Secondary | ICD-10-CM

## 2014-07-17 DIAGNOSIS — H8113 Benign paroxysmal vertigo, bilateral: Secondary | ICD-10-CM

## 2014-07-17 HISTORY — DX: Benign paroxysmal vertigo, unspecified ear: H81.10

## 2014-07-17 MED ORDER — MECLIZINE HCL 25 MG PO TABS: 25.0000 mg | ORAL_TABLET | Freq: Three times a day (TID) | ORAL | Status: DC | PRN

## 2014-07-17 NOTE — Assessment & Plan Note (Signed)
Pt thinks she has had this before  Safety disc in detail - avoidance of falls/ a walker may be necessary  Meclizine px-disc side eff of sedation Update if not starting to improve in a week or if worsening   If acutely worse will call/ go to ER  Reassuring exam today with notable nystagmus

## 2014-07-17 NOTE — Patient Instructions (Signed)
Try the meclizine for vertigo -careful it will make you sleepy  Update Dr Gilford Rile if not starting to improve in a week or if worsening   Make sure to drink fluids  Do not change positions quickly  You may need a walker to prevent falls if you are dizzy     Benign Positional Vertigo Vertigo means you feel like you or your surroundings are moving when they are not. Benign positional vertigo is the most common form of vertigo. Benign means that the cause of your condition is not serious. Benign positional vertigo is more common in older adults. CAUSES  Benign positional vertigo is the result of an upset in the labyrinth system. This is an area in the middle ear that helps control your balance. This may be caused by a viral infection, head injury, or repetitive motion. However, often no specific cause is found. SYMPTOMS  Symptoms of benign positional vertigo occur when you move your head or eyes in different directions. Some of the symptoms may include:  Loss of balance and falls.  Vomiting.  Blurred vision.  Dizziness.  Nausea.  Involuntary eye movements (nystagmus). DIAGNOSIS  Benign positional vertigo is usually diagnosed by physical exam. If the specific cause of your benign positional vertigo is unknown, your caregiver may perform imaging tests, such as magnetic resonance imaging (MRI) or computed tomography (CT). TREATMENT  Your caregiver may recommend movements or procedures to correct the benign positional vertigo. Medicines such as meclizine, benzodiazepines, and medicines for nausea may be used to treat your symptoms. In rare cases, if your symptoms are caused by certain conditions that affect the inner ear, you may need surgery. HOME CARE INSTRUCTIONS   Follow your caregiver's instructions.  Move slowly. Do not make sudden body or head movements.  Avoid driving.  Avoid operating heavy machinery.  Avoid performing any tasks that would be dangerous to you or others during  a vertigo episode.  Drink enough fluids to keep your urine clear or pale yellow. SEEK IMMEDIATE MEDICAL CARE IF:   You develop problems with walking, weakness, numbness, or using your arms, hands, or legs.  You have difficulty speaking.  You develop severe headaches.  Your nausea or vomiting continues or gets worse.  You develop visual changes.  Your family or friends notice any behavioral changes.  Your condition gets worse.  You have a fever.  You develop a stiff neck or sensitivity to light. MAKE SURE YOU:   Understand these instructions.  Will watch your condition.  Will get help right away if you are not doing well or get worse. Document Released: 06/12/2006 Document Revised: 11/27/2011 Document Reviewed: 05/25/2011 [facility] Patient Information 2015 [place], [place]. This information is not intended to replace advice given to you by your health care provider. Make sure you discuss any questions you have with your health care provider.

## 2014-07-17 NOTE — Progress Notes (Signed)
Pre visit review using our clinic review tool, if applicable. No additional management support is needed unless otherwise documented below in the visit note. 

## 2014-07-17 NOTE — Progress Notes (Signed)
Subjective:    Patient ID: Diane Mullins, female    DOB: [birth year], 80 y.o.   MRN: [id number]  HPI Here for a week of weakness/ dizziness and nausea   Started a week ago after eating out - got sick immed after eating out  No energy  Only vomited once Diarrhea twice   BP Readings from Last 3 Encounters:  07/17/14 136/70  06/03/14 136/62  04/01/14 124/68    Stays nauseated   No fever -no chills/ occ feels warm   Dizziness is positional when she turns or bends over    No nasal congestion or allergies   occ L ear pain   Has had vertigo in the past-this feels the same   Patient Active Problem List   Diagnosis Date Noted  . Benign paroxysmal positional vertigo 07/17/2014  . Postmenopausal estrogen deficiency 06/03/2014  . Right leg pain 04/01/2014  . DNR (do not resuscitate) discussion 03/04/2014  . Gait disturbance 05/07/2013  . Type II or unspecified type diabetes mellitus without mention of complication, uncontrolled 04/21/2013  . Tachycardia 01/23/2013  . Medicare annual wellness visit, subsequent 12/09/2012  . Exertional dyspnea 12/09/2012  . Hearing loss 12/09/2012  . Anxiety 01/31/2012  . Hypertension 12/22/2011  . Other and unspecified hyperlipidemia 12/22/2011  . Hypothyroidism 12/22/2011   Past Medical History  Diagnosis Date  . Hyperlipidemia   . Hypertension   . Diabetes mellitus   . Thyroid disease   . H/O: rheumatic fever    Past Surgical History  Procedure Laterality Date  . Vaginal delivery      x4  . Abdominal hysterectomy  1973    menorrhagia  . Hernia repair  1994   History  Substance Use Topics  . Smoking status: Never Smoker   . Smokeless tobacco: Never Used  . Alcohol Use: No   Family History  Problem Relation Age of Onset  . Heart disease Mother 16  . Heart attack Mother 94  . Cancer Father     colon  . Diabetes Sister   . Hypertension Sister   . Cancer Brother     kidney  . Diabetes Brother    No Known  Allergies Current Outpatient Prescriptions on File Prior to Visit  Medication Sig Dispense Refill  . Alcohol Swabs PADS Please use to cleanse skin before testing your blood sugars at home 1-2 times daily. 100 each 3  . ALPRAZolam (XANAX) 0.25 MG tablet Take 1 tablet (0.25 mg total) by mouth 3 (three) times daily as needed for sleep or anxiety. 90 tablet 3  . aspirin EC 81 MG tablet Take 81 mg by mouth daily.    . calcium-vitamin D (OSCAL) 250-125 MG-UNIT per tablet Take 1 tablet by mouth daily.    . fish oil-omega-3 fatty acids 1000 MG capsule Take 2 g by mouth daily.    . furosemide (LASIX) 20 MG tablet Take 1 tablet (20 mg total) by mouth daily. 90 tablet 3  . glimepiride (AMARYL) 4 MG tablet TAKE 1 TABLET EVERY MORNING  AND TAKE 1/2 TABLET EVERY EVENING 135 tablet 1  . glucose blood (ACCU-CHEK AVIVA) test strip PLEASE USE AT HOME TO CHECK BLOOD SUGARS TWICE A DAY. DX CODE: 250.00 100 each 12  . Insulin Glargine (LANTUS SOLOSTAR) 100 UNIT/ML Solostar Pen 15units injected in the morning 1 pen 6  . Insulin Pen Needle (B-D UF III MINI PEN NEEDLES) 31G X 5 MM MISC Please use needles as directed once a day with your  insulin pen. Dx code 250.00 100 each 5  . levothyroxine (SYNTHROID, LEVOTHROID) 75 MCG tablet TAKE 1 TABLET EVERY DAY 90 tablet 2  . losartan-hydrochlorothiazide (HYZAAR) 50-12.5 MG per tablet TAKE 1 TABLET EVERY DAY 90 tablet 1  . metFORMIN (GLUCOPHAGE) 1000 MG tablet TAKE 1 TABLET TWICE DAILY  WITH  A  MEAL 180 tablet 1  . metoprolol succinate (TOPROL-XL) 100 MG 24 hr tablet Take 1 tablet (100 mg total) by mouth daily. 90 tablet 3  . Multiple Vitamins-Minerals (MULTIVITAMIN WITH MINERALS) tablet Take 1 tablet by mouth daily.    Marland Kitchen omeprazole (PRILOSEC) 20 MG capsule Take 1 capsule (20 mg total) by mouth daily. 90 capsule 3  . simvastatin (ZOCOR) 20 MG tablet TAKE 1 TABLET EVERY EVENING 90 tablet 1   No current facility-administered medications on file prior to visit.    Review of  Systems Review of Systems  Constitutional: Negative for fever, appetite change, fatigue and unexpected weight change.  Eyes: Negative for pain and visual disturbance.  ENT pos for occ L ear pain - not today, neg for congestion  Respiratory: Negative for cough and shortness of breath.   Cardiovascular: Negative for cp or palpitations    Gastrointestinal: Negative for nausea, diarrhea and constipation.  Genitourinary: Negative for urgency and frequency.  Skin: Negative for pallor or rash   Neurological: Negative for weakness, numbness and headaches. neg for facial droop/speech problems  Hematological: Negative for adenopathy. Does not bruise/bleed easily.  Psychiatric/Behavioral: Negative for dysphoric mood. The patient is not nervous/anxious.         Objective:   Physical Exam  Constitutional: She is oriented to person, place, and time. She appears well-developed and well-nourished. No distress.  obese and well appearing   HENT:  Head: Normocephalic and atraumatic.  Right Ear: External ear normal.  Left Ear: External ear normal.  Nose: Nose normal.  Mouth/Throat: Oropharynx is clear and moist. No oropharyngeal exudate.  Eyes: Conjunctivae and EOM are normal. Pupils are equal, round, and reactive to light. Right eye exhibits no discharge. Left eye exhibits no discharge. No scleral icterus.  Neck: Normal range of motion. Neck supple. No JVD present. Carotid bruit is not present.  Cardiovascular: Normal rate, regular rhythm, normal heart sounds and intact distal pulses.  Exam reveals no gallop.   No murmur heard. Pulmonary/Chest: Effort normal and breath sounds normal. No respiratory distress. She has no wheezes. She has no rales.  Abdominal: Soft. Bowel sounds are normal. She exhibits no distension and no mass. There is no tenderness.  Musculoskeletal: She exhibits no edema.  Lymphadenopathy:    She has no cervical adenopathy.  Neurological: She is alert and oriented to person, place,  and time. She has normal reflexes. She displays no atrophy and no tremor. No cranial nerve deficit or sensory deficit. She exhibits normal muscle tone. She displays a negative Romberg sign. Coordination normal.  Nystagmus noted 3-4 beats horizontal bilaterally with reprod of dizziness on lat gaze           Assessment & Plan:   Problem List Items Addressed This Visit      Unprioritized   Benign paroxysmal positional vertigo - Primary    Pt thinks she has had this before  Safety disc in detail - avoidance of falls/ a walker may be necessary  Meclizine px-disc side eff of sedation Update if not starting to improve in a week or if worsening   If acutely worse will call/ go to ER  Reassuring exam  today with notable nystagmus

## 2014-07-20 ENCOUNTER — Ambulatory Visit: Payer: Commercial Managed Care - HMO | Admitting: Family Medicine

## 2014-07-27 ENCOUNTER — Telehealth: Payer: Self-pay

## 2014-07-27 NOTE — Telephone Encounter (Signed)
What labs will you want? To have done at [facility]

## 2014-07-27 NOTE — Telephone Encounter (Signed)
Patient is hoping to get a written order for her lab work that she wants to have done in December (for her 12/21 apt).  She stated she wants to come in around 12/19 to pick up this order. Thanks!

## 2014-07-27 NOTE — Telephone Encounter (Signed)
Is this for a physical? If yes, CBC, CMP, lipids, A1c, TSH

## 2014-07-28 NOTE — Telephone Encounter (Signed)
Form is completed and in folder for pick up

## 2014-07-28 NOTE — Telephone Encounter (Signed)
Not for a physical, Appt is for a 3 mth follow up

## 2014-07-28 NOTE — Telephone Encounter (Signed)
OK. CMP, lipids, A1c for diabetes

## 2014-07-30 ENCOUNTER — Encounter: Payer: Self-pay | Admitting: Internal Medicine

## 2014-08-04 ENCOUNTER — Ambulatory Visit: Payer: Commercial Managed Care - HMO | Admitting: Internal Medicine

## 2014-08-17 ENCOUNTER — Telehealth: Payer: Self-pay

## 2014-08-17 NOTE — Telephone Encounter (Signed)
The patient called and is hoping to get a bone density scan scheduled.

## 2014-08-27 ENCOUNTER — Telehealth: Payer: Self-pay

## 2014-08-27 NOTE — Telephone Encounter (Signed)
The patient called and is hoping to get her lab order slip to take to [facility] for her blood work. Pt callback - [phone]

## 2014-08-27 NOTE — Telephone Encounter (Signed)
Form is still in the folder ready for pick up as per previous note

## 2014-09-03 LAB — LIPID PANEL
CHOLESTEROL: 193 mg/dL (ref 0–200)
HDL: 47 mg/dL (ref 35–70)
LDL CALC: 99 mg/dL
TRIGLYCERIDES: 236 mg/dL — AB (ref 40–160)

## 2014-09-03 LAB — BASIC METABOLIC PANEL
BUN: 11 mg/dL (ref 4–21)
CREATININE: 1 mg/dL (ref 0.5–1.1)
Glucose: 146 mg/dL
Potassium: 4.1 mmol/L (ref 3.4–5.3)
Sodium: 142 mmol/L (ref 137–147)

## 2014-09-03 LAB — HEPATIC FUNCTION PANEL
ALK PHOS: 92 U/L (ref 25–125)
ALT: 19 U/L (ref 7–35)
AST: 17 U/L (ref 13–35)
Bilirubin, Total: 0.3 mg/dL

## 2014-09-03 LAB — HEMOGLOBIN A1C: HEMOGLOBIN A1C: 8.7 % — AB (ref 4.0–6.0)

## 2014-09-04 ENCOUNTER — Encounter: Payer: Self-pay | Admitting: Internal Medicine

## 2014-09-07 ENCOUNTER — Ambulatory Visit (INDEPENDENT_AMBULATORY_CARE_PROVIDER_SITE_OTHER): Payer: Commercial Managed Care - HMO | Admitting: Internal Medicine

## 2014-09-07 ENCOUNTER — Encounter: Payer: Self-pay | Admitting: Internal Medicine

## 2014-09-07 VITALS — BP 116/70 | HR 65 | Temp 98.1°F | Ht 60.5 in | Wt 206.8 lb

## 2014-09-07 DIAGNOSIS — E538 Deficiency of other specified B group vitamins: Secondary | ICD-10-CM

## 2014-09-07 DIAGNOSIS — I10 Essential (primary) hypertension: Secondary | ICD-10-CM

## 2014-09-07 DIAGNOSIS — E1165 Type 2 diabetes mellitus with hyperglycemia: Secondary | ICD-10-CM

## 2014-09-07 DIAGNOSIS — R269 Unspecified abnormalities of gait and mobility: Secondary | ICD-10-CM

## 2014-09-07 DIAGNOSIS — IMO0002 Reserved for concepts with insufficient information to code with codable children: Secondary | ICD-10-CM

## 2014-09-07 DIAGNOSIS — E785 Hyperlipidemia, unspecified: Secondary | ICD-10-CM

## 2014-09-07 MED ORDER — METOPROLOL SUCCINATE ER 100 MG PO TB24: 100.0000 mg | ORAL_TABLET | Freq: Every day | ORAL | Status: DC

## 2014-09-07 MED ORDER — CYANOCOBALAMIN 1000 MCG/ML IJ SOLN
1000.0000 ug | Freq: Once | INTRAMUSCULAR | Status: AC
  Administered 2014-09-07: 1000 ug via INTRAMUSCULAR

## 2014-09-07 NOTE — Assessment & Plan Note (Signed)
BP Readings from Last 3 Encounters:  09/07/14 116/70  07/17/14 136/70  06/03/14 136/62   BP well controlled on current medications. Will continue.

## 2014-09-07 NOTE — Progress Notes (Signed)
Pre visit review using our clinic review tool, if applicable. No additional management support is needed unless otherwise documented below in the visit note. 

## 2014-09-07 NOTE — Addendum Note (Signed)
Addended by: Vernetta Honey on: 09/07/2014 09:42 AM   Modules accepted: Orders

## 2014-09-07 NOTE — Patient Instructions (Addendum)
Continue current medications.  We will schedule a bone density test.  Follow up in 3 months and sooner as needed.

## 2014-09-07 NOTE — Assessment & Plan Note (Signed)
Recent occasional episodes of gait imbalance. She was on B12 injections before for B12 deficiency, and I suspect this may be playing a role. Will restart monthly B12 injections. Follow up in 3 months and prn. Discussed home safety including shower bars, using a Chidera Dearcos or cane, all of which she has in place.

## 2014-09-07 NOTE — Assessment & Plan Note (Signed)
BG continue to be above goal, however having some low fasting BG. Will continue Lantus at current dose for now. Encouraged her to limit intake of sweets. Recheck A1c in 11/2014, if no improvement, plan increase Lantus.

## 2014-09-07 NOTE — Progress Notes (Signed)
Subjective:    Patient ID: Diane Mullins, female    DOB: 04-23-1933, 80 y.o.   MRN: [id number]  HPI  80YO female presents for follow up.  DM - Recent A1c 8.7% which is relatively unchanged from previous. Notes some dietary indiscretion with increased intake of sweets. Lowest blood sugar recently 82. Most fasting sugars in the 90s.  Feeling tired. No focal symptoms. Has had family members staying for [place].  Notes some balance instability lately. Occasionally feels weak in legs. Seen by orthopedics at [facility]. Had steroid injection in right knee with improvement in pain control.  Past medical, surgical, family and social history per today's encounter.  Review of Systems  Constitutional: Negative for fever, chills, appetite change, fatigue and unexpected weight change.  Eyes: Negative for visual disturbance.  Respiratory: Negative for shortness of breath.   Cardiovascular: Negative for chest pain and leg swelling.  Gastrointestinal: Negative for vomiting, abdominal pain, diarrhea and constipation.  Musculoskeletal: Positive for gait problem. Negative for myalgias and arthralgias.  Skin: Negative for color change and rash.  Neurological: Positive for weakness (intermittent).  Hematological: Negative for adenopathy. Does not bruise/bleed easily.  Psychiatric/Behavioral: Negative for dysphoric mood. The patient is not nervous/anxious.        Objective:    BP 116/70 mmHg  Pulse 65  Temp(Src) 98.1 F (36.7 C) (Oral)  Ht 5' 0.5" (1.537 m)  Wt 206 lb 12 oz (93.781 kg)  BMI 39.70 kg/m2  SpO2 97% Physical Exam  Constitutional: She is oriented to person, place, and time. She appears well-developed and well-nourished. No distress.  HENT:  Head: Normocephalic and atraumatic.  Right Ear: External ear normal.  Left Ear: External ear normal.  Nose: Nose normal.  Mouth/Throat: Oropharynx is clear and moist. No oropharyngeal exudate.  Eyes: Conjunctivae are normal. Pupils  are equal, round, and reactive to light. Right eye exhibits no discharge. Left eye exhibits no discharge. No scleral icterus.  Neck: Normal range of motion. Neck supple. No tracheal deviation present. No thyromegaly present.  Cardiovascular: Normal rate, regular rhythm, normal heart sounds and intact distal pulses.  Exam reveals no gallop and no friction rub.   No murmur heard. Pulmonary/Chest: Effort normal and breath sounds normal. No accessory muscle usage. No tachypnea. No respiratory distress. She has no decreased breath sounds. She has no wheezes. She has no rhonchi. She has no rales. She exhibits no tenderness.  Musculoskeletal: Normal range of motion. She exhibits no edema or tenderness.  Lymphadenopathy:    She has no cervical adenopathy.  Neurological: She is alert and oriented to person, place, and time. No cranial nerve deficit. She exhibits normal muscle tone. Coordination normal.  Skin: Skin is warm and dry. No rash noted. She is not diaphoretic. No erythema. No pallor.  Psychiatric: She has a normal mood and affect. Her behavior is normal. Judgment and thought content normal.          Assessment & Plan:   Problem List Items Addressed This Visit      Unprioritized   Gait disturbance    Recent occasional episodes of gait imbalance. She was on B12 injections before for B12 deficiency, and I suspect this may be playing a role. Will restart monthly B12 injections. Follow up in 3 months and prn. Discussed home safety including shower bars, using a Leron Stoffers or cane, all of which she has in place.    Hyperlipidemia    Lipids well controlled on Simvastatin.    Relevant Medications  metoprolol succinate (TOPROL-XL) 24 hr tablet   Hypertension    BP Readings from Last 3 Encounters:  09/07/14 116/70  07/17/14 136/70  06/03/14 136/62   BP well controlled on current medications. Will continue.    Relevant Medications      metoprolol succinate (TOPROL-XL) 24 hr tablet   Type  2 diabetes mellitus, uncontrolled - Primary    BG continue to be above goal, however having some low fasting BG. Will continue Lantus at current dose for now. Encouraged her to limit intake of sweets. Recheck A1c in 11/2014, if no improvement, plan increase Lantus.        Return in about 3 months (around 12/07/2014) for Recheck of Diabetes.

## 2014-09-07 NOTE — Assessment & Plan Note (Signed)
Lipids well controlled on Simvastatin.

## 2014-09-30 ENCOUNTER — Other Ambulatory Visit: Payer: Self-pay | Admitting: Internal Medicine

## 2014-10-13 ENCOUNTER — Encounter: Payer: Self-pay | Admitting: Internal Medicine

## 2014-12-02 LAB — HEPATIC FUNCTION PANEL
ALK PHOS: 98 U/L (ref 25–125)
ALT: 15 U/L (ref 7–35)
AST: 15 U/L (ref 13–35)
BILIRUBIN, TOTAL: 0.2 mg/dL

## 2014-12-02 LAB — TSH: TSH: 4.33 u[IU]/mL (ref 0.41–5.90)

## 2014-12-02 LAB — BASIC METABOLIC PANEL
BUN: 13 mg/dL (ref 4–21)
Creatinine: 1 mg/dL (ref 0.5–1.1)
Glucose: 133 mg/dL
POTASSIUM: 4.2 mmol/L (ref 3.4–5.3)
SODIUM: 141 mmol/L (ref 137–147)

## 2014-12-02 LAB — CBC AND DIFFERENTIAL
HCT: 36 % (ref 36–46)
HEMOGLOBIN: 12 g/dL (ref 12.0–16.0)
Neutrophils Absolute: 3 /uL
Platelets: 296 10*3/uL (ref 150–399)
WBC: 7.4 10^3/mL

## 2014-12-02 LAB — LIPID PANEL
Cholesterol: 194 mg/dL (ref 0–200)
HDL: 48 mg/dL (ref 35–70)
LDL CALC: 102 mg/dL
Triglycerides: 218 mg/dL — AB (ref 40–160)

## 2014-12-02 LAB — HEMOGLOBIN A1C: Hgb A1c MFr Bld: 8.9 % — AB (ref 4.0–6.0)

## 2014-12-03 ENCOUNTER — Telehealth: Payer: Self-pay | Admitting: Internal Medicine

## 2014-12-03 NOTE — Telephone Encounter (Signed)
Recent labs showed normal blood counts, normal kidney and liver function, normal thyroid function. Blood sugars were elevated with A1c 8.9%. Triglycerides were elevated. Please make sure she has a follow up visit.

## 2014-12-04 NOTE — Telephone Encounter (Signed)
Left vm for pt to return my call.  

## 2014-12-07 ENCOUNTER — Ambulatory Visit (INDEPENDENT_AMBULATORY_CARE_PROVIDER_SITE_OTHER): Payer: Commercial Managed Care - HMO | Admitting: Internal Medicine

## 2014-12-07 ENCOUNTER — Encounter: Payer: Self-pay | Admitting: Internal Medicine

## 2014-12-07 VITALS — BP 142/68 | HR 63 | Temp 97.7°F | Ht 60.5 in | Wt 205.5 lb

## 2014-12-07 DIAGNOSIS — I10 Essential (primary) hypertension: Secondary | ICD-10-CM

## 2014-12-07 DIAGNOSIS — E785 Hyperlipidemia, unspecified: Secondary | ICD-10-CM

## 2014-12-07 DIAGNOSIS — Z1211 Encounter for screening for malignant neoplasm of colon: Secondary | ICD-10-CM

## 2014-12-07 DIAGNOSIS — Z1283 Encounter for screening for malignant neoplasm of skin: Secondary | ICD-10-CM

## 2014-12-07 DIAGNOSIS — E119 Type 2 diabetes mellitus without complications: Secondary | ICD-10-CM

## 2014-12-07 DIAGNOSIS — Z0100 Encounter for examination of eyes and vision without abnormal findings: Secondary | ICD-10-CM

## 2014-12-07 DIAGNOSIS — E1165 Type 2 diabetes mellitus with hyperglycemia: Secondary | ICD-10-CM

## 2014-12-07 DIAGNOSIS — IMO0002 Reserved for concepts with insufficient information to code with codable children: Secondary | ICD-10-CM

## 2014-12-07 MED ORDER — METOPROLOL SUCCINATE ER 100 MG PO TB24: 100.0000 mg | ORAL_TABLET | Freq: Every day | ORAL | Status: DC

## 2014-12-07 NOTE — Progress Notes (Signed)
Pre visit review using our clinic review tool, if applicable. No additional management support is needed unless otherwise documented below in the visit note. 

## 2014-12-07 NOTE — Progress Notes (Signed)
Subjective:    Patient ID: Diane Mullins, female    DOB: 1933/05/27, 81 y.o.   MRN: [id number]  HPI  81YO female presents for follow up.  Last visit 08/2015.  Didn't sleep well last night.  DM - BG 75 this morning. Most BG near 80-95. Last A1c 8.9% Planning to start back to water aerobics. Following healthy diet.  HTN - Does not check BP at home. Compliant with meds. No CP, HA.  Past medical, surgical, family and social history per today's encounter.  Review of Systems  Constitutional: Negative for fever, chills, appetite change, fatigue and unexpected weight change.  Eyes: Negative for visual disturbance.  Respiratory: Negative for shortness of breath.   Cardiovascular: Negative for chest pain and leg swelling.  Gastrointestinal: Negative for nausea, vomiting, abdominal pain, diarrhea, constipation and blood in stool.  Musculoskeletal: Positive for myalgias and arthralgias.  Skin: Negative for color change and rash.  Hematological: Negative for adenopathy. Does not bruise/bleed easily.  Psychiatric/Behavioral: Positive for sleep disturbance. Negative for dysphoric mood. The patient is not nervous/anxious.        Objective:    BP 142/68 mmHg  Pulse 63  Temp(Src) 97.7 F (36.5 C) (Oral)  Ht 5' 0.5" (1.537 m)  Wt 205 lb 8 oz (93.214 kg)  BMI 39.46 kg/m2  SpO2 97% Physical Exam  Constitutional: She is oriented to person, place, and time. She appears well-developed and well-nourished. No distress.  HENT:  Head: Normocephalic and atraumatic.  Right Ear: External ear normal.  Left Ear: External ear normal.  Nose: Nose normal.  Mouth/Throat: Oropharynx is clear and moist. No oropharyngeal exudate.  Eyes: Conjunctivae are normal. Pupils are equal, round, and reactive to light. Right eye exhibits no discharge. Left eye exhibits no discharge. No scleral icterus.  Neck: Normal range of motion. Neck supple. No tracheal deviation present. No thyromegaly present.    Cardiovascular: Normal rate, regular rhythm, normal heart sounds and intact distal pulses.  Exam reveals no gallop and no friction rub.   No murmur heard. Pulmonary/Chest: Effort normal and breath sounds normal. No respiratory distress. She has no wheezes. She has no rales. She exhibits no tenderness.  Musculoskeletal: Normal range of motion. She exhibits no edema or tenderness.  Lymphadenopathy:    She has no cervical adenopathy.  Neurological: She is alert and oriented to person, place, and time. No cranial nerve deficit. She exhibits normal muscle tone. Coordination normal.  Skin: Skin is warm and dry. No rash noted. She is not diaphoretic. No erythema. No pallor.  Psychiatric: She has a normal mood and affect. Her behavior is normal. Judgment and thought content normal.          Assessment & Plan:   Problem List Items Addressed This Visit      Unprioritized   Hyperlipidemia    Lipids generally well controlled. Continue Simvastatin.      Relevant Medications   metoprolol succinate (TOPROL-XL) 24 hr tablet   Hypertension - Primary    BP Readings from Last 3 Encounters:  12/07/14 142/68  09/07/14 116/70  07/17/14 136/70   BP slightly elevated today, but generally has been well controlled. Will continue current medication. Renal function was normal on labs.      Relevant Medications   metoprolol succinate (TOPROL-XL) 24 hr tablet   Type 2 diabetes mellitus, uncontrolled    A1c 8.9%. BG continues to be elevated. Discussed increasing Glimepiride to 6mg  daily, but will hold for now as having some BG near  70 at home. She will monitor BG 2-3 times daily. Follow up by email or call in 2 weeks.       Other Visit Diagnoses    Diabetic eye exam        Relevant Orders    Ambulatory referral to Ophthalmology    Screening for skin cancer        Relevant Orders    Ambulatory referral to Dermatology    Screening for colon cancer        Relevant Orders    Ambulatory referral to  Gastroenterology        Return in about 3 months (around 03/09/2015) for Recheck of Diabetes.

## 2014-12-07 NOTE — Assessment & Plan Note (Signed)
BP Readings from Last 3 Encounters:  12/07/14 142/68  09/07/14 116/70  07/17/14 136/70   BP slightly elevated today, but generally has been well controlled. Will continue current medication. Renal function was normal on labs.

## 2014-12-07 NOTE — Telephone Encounter (Signed)
Dr Gilford Rile discussing with pt today during office visit

## 2014-12-07 NOTE — Assessment & Plan Note (Signed)
Lipids generally well controlled. Continue Simvastatin.

## 2014-12-07 NOTE — Patient Instructions (Addendum)
Please check blood sugars 2-3 times daily. Please check some blood sugars 1-2 hours after a meal. Email or call with readings.  Continue current medications.  Follow up in 3 months.

## 2014-12-07 NOTE — Assessment & Plan Note (Signed)
A1c 8.9%. BG continues to be elevated. Discussed increasing Glimepiride to 6mg  daily, but will hold for now as having some BG near 70 at home. She will monitor BG 2-3 times daily. Follow up by email or call in 2 weeks.

## 2014-12-22 ENCOUNTER — Encounter: Payer: Self-pay | Admitting: Internal Medicine

## 2014-12-24 ENCOUNTER — Telehealth: Payer: Self-pay | Admitting: *Deleted

## 2014-12-24 NOTE — Telephone Encounter (Signed)
We can have her stop by to pick up an IFOB

## 2014-12-24 NOTE — Telephone Encounter (Signed)
Pt called states [facility] physician requests she have a hemoccult performed since Colonoscopies aren't indicated in pts her age.  Pt requesting Dr Gilford Rile to order.  Please advise

## 2014-12-25 NOTE — Telephone Encounter (Signed)
Spoke with pt, advised of MDs message 

## 2015-01-11 ENCOUNTER — Telehealth: Payer: Self-pay | Admitting: *Deleted

## 2015-01-11 ENCOUNTER — Other Ambulatory Visit: Payer: Commercial Managed Care - HMO

## 2015-01-11 DIAGNOSIS — D649 Anemia, unspecified: Secondary | ICD-10-CM

## 2015-01-11 NOTE — Telephone Encounter (Signed)
Diane Mullins called states they received an IFOB from pt without orders.  As per 4.7.16 phone not pt was to come in to pick up kit.  Please enter orders

## 2015-01-12 ENCOUNTER — Encounter: Payer: Self-pay | Admitting: *Deleted

## 2015-01-12 ENCOUNTER — Other Ambulatory Visit: Payer: Self-pay | Admitting: *Deleted

## 2015-01-12 ENCOUNTER — Other Ambulatory Visit (INDEPENDENT_AMBULATORY_CARE_PROVIDER_SITE_OTHER): Payer: Commercial Managed Care - HMO

## 2015-01-12 DIAGNOSIS — Z1211 Encounter for screening for malignant neoplasm of colon: Secondary | ICD-10-CM

## 2015-01-12 LAB — FECAL OCCULT BLOOD, IMMUNOCHEMICAL: Fecal Occult Bld: NEGATIVE

## 2015-01-19 ENCOUNTER — Encounter: Payer: Self-pay | Admitting: Internal Medicine

## 2015-03-01 ENCOUNTER — Telehealth: Payer: Self-pay | Admitting: Internal Medicine

## 2015-03-01 DIAGNOSIS — E119 Type 2 diabetes mellitus without complications: Secondary | ICD-10-CM

## 2015-03-01 DIAGNOSIS — Z0100 Encounter for examination of eyes and vision without abnormal findings: Principal | ICD-10-CM

## 2015-03-01 NOTE — Telephone Encounter (Signed)
What is the referral for ?? Are the labs for a physical?

## 2015-03-01 NOTE — Telephone Encounter (Signed)
Pt request labs to be done at [facility]. Please advise pt when ready.  Pt also needs a referral put in for [facility].

## 2015-03-01 NOTE — Telephone Encounter (Signed)
Left vm for pt to return my call.  Appt next week is for a 3 mth dm follow up.

## 2015-03-02 NOTE — Addendum Note (Signed)
Addended by: Ronette Deter A on: 03/02/2015 11:49 AM   Modules accepted: Orders

## 2015-03-02 NOTE — Telephone Encounter (Signed)
Pt is requesting referral for Eye exam at [facility] already scheduled for 03/10/15.  Pt's appt is for Diabetic follow up, asking for lab order for [facility]

## 2015-03-05 LAB — HEPATIC FUNCTION PANEL
ALT: 16 U/L (ref 7–35)
AST: 17 U/L (ref 13–35)
Alkaline Phosphatase: 101 U/L (ref 25–125)
BILIRUBIN, TOTAL: 0.2 mg/dL

## 2015-03-05 LAB — LIPID PANEL
Cholesterol: 183 mg/dL (ref 0–200)
HDL: 51 mg/dL (ref 35–70)
LDL Cholesterol: 91 mg/dL
Triglycerides: 206 mg/dL — AB (ref 40–160)

## 2015-03-05 LAB — CBC AND DIFFERENTIAL
HCT: 36 % (ref 36–46)
HEMOGLOBIN: 12.2 g/dL (ref 12.0–16.0)
Neutrophils Absolute: 3 /uL
Platelets: 332 10*3/uL (ref 150–399)
WBC: 7.7 10*3/mL

## 2015-03-05 LAB — HEMOGLOBIN A1C: Hgb A1c MFr Bld: 9.1 % — AB (ref 4.0–6.0)

## 2015-03-05 LAB — BASIC METABOLIC PANEL
BUN: 14 mg/dL (ref 4–21)
Creatinine: 0.9 mg/dL (ref <=1.1)
POTASSIUM: 4.7 mmol/L (ref 3.4–5.3)
Sodium: 140 mmol/L (ref 137–147)

## 2015-03-05 LAB — TSH: TSH: 3.9 u[IU]/mL (ref <=5.90)

## 2015-03-08 ENCOUNTER — Other Ambulatory Visit: Payer: Self-pay | Admitting: Internal Medicine

## 2015-03-10 ENCOUNTER — Ambulatory Visit: Payer: Commercial Managed Care - HMO | Admitting: Internal Medicine

## 2015-03-10 LAB — HM DIABETES EYE EXAM

## 2015-03-11 ENCOUNTER — Telehealth: Payer: Self-pay | Admitting: *Deleted

## 2015-03-11 ENCOUNTER — Encounter: Payer: Self-pay | Admitting: *Deleted

## 2015-03-11 NOTE — Telephone Encounter (Signed)
Pt called to inquire whether Test Strips had been refilled.  Spoke with pt, advised Test Strips along with other medications sent to [facility] on 6.21.16.

## 2015-03-30 ENCOUNTER — Ambulatory Visit (INDEPENDENT_AMBULATORY_CARE_PROVIDER_SITE_OTHER): Payer: Commercial Managed Care - HMO | Admitting: Internal Medicine

## 2015-03-30 ENCOUNTER — Encounter: Payer: Self-pay | Admitting: Internal Medicine

## 2015-03-30 VITALS — BP 107/64 | HR 73 | Temp 97.5°F | Ht 60.5 in | Wt 207.4 lb

## 2015-03-30 DIAGNOSIS — F4322 Adjustment disorder with anxiety: Secondary | ICD-10-CM

## 2015-03-30 DIAGNOSIS — E1165 Type 2 diabetes mellitus with hyperglycemia: Secondary | ICD-10-CM | POA: Diagnosis not present

## 2015-03-30 DIAGNOSIS — I10 Essential (primary) hypertension: Secondary | ICD-10-CM | POA: Diagnosis not present

## 2015-03-30 DIAGNOSIS — IMO0002 Reserved for concepts with insufficient information to code with codable children: Secondary | ICD-10-CM

## 2015-03-30 MED ORDER — SITAGLIPTIN PHOSPHATE 25 MG PO TABS: 25.0000 mg | ORAL_TABLET | Freq: Every day | ORAL | Status: DC

## 2015-03-30 MED ORDER — ALPRAZOLAM 0.25 MG PO TABS: 0.2500 mg | ORAL_TABLET | Freq: Three times a day (TID) | ORAL | Status: DC | PRN

## 2015-03-30 NOTE — Assessment & Plan Note (Signed)
Symptoms well controlled with prn Alprazolam. Will continue. She understands the potential risks of this medication.

## 2015-03-30 NOTE — Assessment & Plan Note (Signed)
BP Readings from Last 3 Encounters:  03/30/15 107/64  12/07/14 142/68  09/07/14 116/70   BP well controlled. Renal function with recent labs was normal.

## 2015-03-30 NOTE — Assessment & Plan Note (Addendum)
BG continues to be elevated based on A1c. Discussed several options including increasing Lantus versus adding new medication. Will add Januvia, and hold Lantus. Goal of optimizing Januvia over next few weeks. Continue Metformin and Glimepiride. Discussed potential side effects from this medication. Follow up in 2 weeks.

## 2015-03-30 NOTE — Progress Notes (Signed)
Pre visit review using our clinic review tool, if applicable. No additional management support is needed unless otherwise documented below in the visit note. 

## 2015-03-30 NOTE — Progress Notes (Signed)
Subjective:    Patient ID: Diane Mullins, female    DOB: 1933/02/01, 81 y.o.   MRN: [id number]  HPI  81YO female presents for follow up.  DM - BG well controlled. Few episodes above 200 however rare. Most often near 80 fasting. Did not bring record of BG today. A1c from [facility] was 9.1% in 02/2015. Compliant with medication.  No other concerns today. Generally feeling well. No CP, palpitations, HA.  Past medical, surgical, family and social history per today's encounter.  Review of Systems  Constitutional: Negative for fever, chills, appetite change, fatigue and unexpected weight change.  Eyes: Negative for visual disturbance.  Respiratory: Negative for shortness of breath.   Cardiovascular: Negative for chest pain and leg swelling.  Gastrointestinal: Negative for nausea, vomiting, abdominal pain, diarrhea and constipation.  Musculoskeletal: Negative for myalgias and arthralgias.  Skin: Negative for color change and rash.  Hematological: Negative for adenopathy. Does not bruise/bleed easily.  Psychiatric/Behavioral: Negative for dysphoric mood. The patient is not nervous/anxious.        Objective:    BP 107/64 mmHg  Pulse 73  Temp(Src) 97.5 F (36.4 C) (Oral)  Ht 5' 0.5" (1.537 m)  Wt 207 lb 6 oz (94.065 kg)  BMI 39.82 kg/m2  SpO2 96% Physical Exam  Constitutional: She is oriented to person, place, and time. She appears well-developed and well-nourished. No distress.  HENT:  Head: Normocephalic and atraumatic.  Right Ear: External ear normal.  Left Ear: External ear normal.  Nose: Nose normal.  Mouth/Throat: Oropharynx is clear and moist. No oropharyngeal exudate.  Eyes: Conjunctivae are normal. Pupils are equal, round, and reactive to light. Right eye exhibits no discharge. Left eye exhibits no discharge. No scleral icterus.  Neck: Normal range of motion. Neck supple. No tracheal deviation present. No thyromegaly present.  Cardiovascular: Normal rate, regular  rhythm, normal heart sounds and intact distal pulses.  Exam reveals no gallop and no friction rub.   No murmur heard. Pulmonary/Chest: Effort normal and breath sounds normal. No respiratory distress. She has no wheezes. She has no rales. She exhibits no tenderness.  Musculoskeletal: Normal range of motion. She exhibits no edema or tenderness.  Lymphadenopathy:    She has no cervical adenopathy.  Neurological: She is alert and oriented to person, place, and time. No cranial nerve deficit. She exhibits normal muscle tone. Coordination normal.  Skin: Skin is warm and dry. No rash noted. She is not diaphoretic. No erythema. No pallor.  Psychiatric: She has a normal mood and affect. Her behavior is normal. Judgment and thought content normal.          Assessment & Plan:   Problem List Items Addressed This Visit      Unprioritized   Adjustment disorder with anxious mood    Symptoms well controlled with prn Alprazolam. Will continue. She understands the potential risks of this medication.      Relevant Medications   ALPRAZolam (XANAX) 0.25 MG tablet   Hypertension    BP Readings from Last 3 Encounters:  03/30/15 107/64  12/07/14 142/68  09/07/14 116/70   BP well controlled. Renal function with recent labs was normal.      Type 2 diabetes mellitus, uncontrolled - Primary    BG continues to be elevated based on A1c. Discussed several options including increasing Lantus versus adding new medication. Will add Januvia, and hold Lantus. Goal of optimizing Januvia over next few weeks. Continue Metformin and Glimepiride. Discussed potential side effects from this medication.  Follow up in 2 weeks.      Relevant Medications   sitaGLIPtin (JANUVIA) 25 MG tablet   Other Relevant Orders   Ambulatory referral to diabetic education       Return in about 2 weeks (around 04/13/2015) for Recheck of Diabetes.

## 2015-03-30 NOTE — Patient Instructions (Addendum)
Start Januvia 25mg  daily with breakfast.  Stop Lantus.  Monitor blood sugars twice daily.  Follow up in 4 weeks.

## 2015-04-07 ENCOUNTER — Ambulatory Visit: Payer: Commercial Managed Care - HMO | Admitting: *Deleted

## 2015-04-14 ENCOUNTER — Ambulatory Visit (INDEPENDENT_AMBULATORY_CARE_PROVIDER_SITE_OTHER): Payer: Commercial Managed Care - HMO | Admitting: Internal Medicine

## 2015-04-14 ENCOUNTER — Encounter: Payer: Self-pay | Admitting: Internal Medicine

## 2015-04-14 VITALS — BP 128/75 | HR 89 | Temp 98.0°F | Ht 60.5 in | Wt 207.0 lb

## 2015-04-14 DIAGNOSIS — F419 Anxiety disorder, unspecified: Secondary | ICD-10-CM

## 2015-04-14 DIAGNOSIS — I10 Essential (primary) hypertension: Secondary | ICD-10-CM | POA: Diagnosis not present

## 2015-04-14 DIAGNOSIS — E1165 Type 2 diabetes mellitus with hyperglycemia: Secondary | ICD-10-CM | POA: Diagnosis not present

## 2015-04-14 DIAGNOSIS — IMO0002 Reserved for concepts with insufficient information to code with codable children: Secondary | ICD-10-CM

## 2015-04-14 MED ORDER — SITAGLIPTIN PHOSPHATE 50 MG PO TABS: 50.0000 mg | ORAL_TABLET | Freq: Every day | ORAL | Status: DC

## 2015-04-14 NOTE — Assessment & Plan Note (Signed)
Recent worsening anxiety. Offered support today. Follow up in 2 months and prn.

## 2015-04-14 NOTE — Patient Instructions (Addendum)
Increase Januvia to 50mg  daily.  Labs in 05/2015

## 2015-04-14 NOTE — Progress Notes (Signed)
Subjective:    Patient ID: Diane Mullins, female    DOB: [birth year], 81 y.o.   MRN: [id number]  HPI  81YO female presents for follow up.  DM - Started on Januvia last visit. BG mostly near 100. Max 197 postprandial. No side effects noted from the medication.  Wt Readings from Last 3 Encounters:  04/14/15 207 lb (93.895 kg)  03/30/15 207 lb 6 oz (94.065 kg)  12/07/14 205 lb 8 oz (93.214 kg)   She notes some increased anxiety over the last two weeks. Her home has been diagnosed with mold. Her best friend passed away. She feels that she is coping all right with this.  Past medical, surgical, family and social history per today's encounter.  Review of Systems  Constitutional: Negative for fever, chills, appetite change, fatigue and unexpected weight change.  Eyes: Negative for visual disturbance.  Respiratory: Negative for shortness of breath.   Cardiovascular: Negative for chest pain and leg swelling.  Gastrointestinal: Negative for nausea, vomiting, abdominal pain, diarrhea and constipation.  Musculoskeletal: Negative for myalgias and arthralgias.  Skin: Negative for color change and rash.  Hematological: Negative for adenopathy. Does not bruise/bleed easily.  Psychiatric/Behavioral: Negative for sleep disturbance and dysphoric mood. The patient is not nervous/anxious.        Objective:    BP 128/75 mmHg  Pulse 89  Temp(Src) 98 F (36.7 C) (Oral)  Ht 5' 0.5" (1.537 m)  Wt 207 lb (93.895 kg)  BMI 39.75 kg/m2  SpO2 95% Physical Exam  Constitutional: She is oriented to person, place, and time. She appears well-developed and well-nourished. No distress.  HENT:  Head: Normocephalic and atraumatic.  Right Ear: External ear normal.  Left Ear: External ear normal.  Nose: Nose normal.  Mouth/Throat: Oropharynx is clear and moist. No oropharyngeal exudate.  Eyes: Conjunctivae are normal. Pupils are equal, round, and reactive to light. Right eye exhibits no discharge. Left eye  exhibits no discharge. No scleral icterus.  Neck: Normal range of motion. Neck supple. No tracheal deviation present. No thyromegaly present.  Cardiovascular: Normal rate, regular rhythm, normal heart sounds and intact distal pulses.  Exam reveals no gallop and no friction rub.   No murmur heard. Pulmonary/Chest: Effort normal and breath sounds normal. No respiratory distress. She has no wheezes. She has no rales. She exhibits no tenderness.  Musculoskeletal: Normal range of motion. She exhibits no edema or tenderness.  Lymphadenopathy:    She has no cervical adenopathy.  Neurological: She is alert and oriented to person, place, and time. No cranial nerve deficit. She exhibits normal muscle tone. Coordination normal.  Skin: Skin is warm and dry. No rash noted. She is not diaphoretic. No erythema. No pallor.  Psychiatric: She has a normal mood and affect. Her behavior is normal. Judgment and thought content normal.          Assessment & Plan:   Problem List Items Addressed This Visit      Unprioritized   Anxiety    Recent worsening anxiety. Offered support today. Follow up in 2 months and prn.      Hypertension    BP Readings from Last 3 Encounters:  04/14/15 128/75  03/30/15 107/64  12/07/14 142/68   BP well controlled. Continue current medications. Check renal function with labs in 05/2015 at [place].      Type 2 diabetes mellitus, uncontrolled - Primary    BG improving however still above goal. Increase Januvia to 50mg  daily. Recheck A1c in 05/2015. Follow  up in 05/2015.      Relevant Medications   sitaGLIPtin (JANUVIA) 50 MG tablet       Return in about 2 months (around 06/15/2015) for Recheck of Diabetes.

## 2015-04-14 NOTE — Assessment & Plan Note (Signed)
BP Readings from Last 3 Encounters:  04/14/15 128/75  03/30/15 107/64  12/07/14 142/68   BP well controlled. Continue current medications. Check renal function with labs in 05/2015 at [place].

## 2015-04-14 NOTE — Assessment & Plan Note (Signed)
BG improving however still above goal. Increase Januvia to 50mg  daily. Recheck A1c in 05/2015. Follow up in 05/2015.

## 2015-04-14 NOTE — Progress Notes (Signed)
Pre visit review using our clinic review tool, if applicable. No additional management support is needed unless otherwise documented below in the visit note. 

## 2015-05-03 ENCOUNTER — Encounter: Payer: Self-pay | Admitting: Internal Medicine

## 2015-05-03 LAB — HM MAMMOGRAPHY: HM MAMMO: NEGATIVE

## 2015-05-11 ENCOUNTER — Encounter: Payer: Self-pay | Admitting: Internal Medicine

## 2015-05-12 ENCOUNTER — Encounter: Payer: Self-pay | Admitting: Internal Medicine

## 2015-06-17 ENCOUNTER — Encounter: Payer: Self-pay | Admitting: Internal Medicine

## 2015-06-17 ENCOUNTER — Ambulatory Visit (INDEPENDENT_AMBULATORY_CARE_PROVIDER_SITE_OTHER): Payer: Commercial Managed Care - HMO | Admitting: Internal Medicine

## 2015-06-17 VITALS — BP 136/66 | HR 79 | Temp 97.7°F | Ht 60.5 in | Wt 204.0 lb

## 2015-06-17 DIAGNOSIS — E785 Hyperlipidemia, unspecified: Secondary | ICD-10-CM | POA: Diagnosis not present

## 2015-06-17 DIAGNOSIS — I10 Essential (primary) hypertension: Secondary | ICD-10-CM | POA: Diagnosis not present

## 2015-06-17 DIAGNOSIS — Z23 Encounter for immunization: Secondary | ICD-10-CM | POA: Diagnosis not present

## 2015-06-17 DIAGNOSIS — F4322 Adjustment disorder with anxiety: Secondary | ICD-10-CM | POA: Diagnosis not present

## 2015-06-17 DIAGNOSIS — IMO0002 Reserved for concepts with insufficient information to code with codable children: Secondary | ICD-10-CM

## 2015-06-17 DIAGNOSIS — E1165 Type 2 diabetes mellitus with hyperglycemia: Secondary | ICD-10-CM | POA: Diagnosis not present

## 2015-06-17 MED ORDER — INSULIN GLARGINE 100 UNIT/ML SOLOSTAR PEN: 15.0000 [IU] | PEN_INJECTOR | Freq: Every day | SUBCUTANEOUS | Status: DC

## 2015-06-17 MED ORDER — ALPRAZOLAM 0.25 MG PO TABS: 0.2500 mg | ORAL_TABLET | Freq: Three times a day (TID) | ORAL | Status: DC | PRN

## 2015-06-17 NOTE — Assessment & Plan Note (Signed)
Symptoms well controlled.Continue prn Alprazolam.

## 2015-06-17 NOTE — Progress Notes (Signed)
Pre visit review using our clinic review tool, if applicable. No additional management support is needed unless otherwise documented below in the visit note. 

## 2015-06-17 NOTE — Assessment & Plan Note (Signed)
Recent LFTs normal. Continue Simvastatin.

## 2015-06-17 NOTE — Assessment & Plan Note (Signed)
BP Readings from Last 3 Encounters:  06/17/15 136/66  04/14/15 128/75  03/30/15 107/64   BP well controlled. Continue current medications.

## 2015-06-17 NOTE — Patient Instructions (Addendum)
Stop Januvia.  Start back Lantus 15units per day. Email or call with blood sugar readings. Follow up in 4 weeks.

## 2015-06-17 NOTE — Progress Notes (Signed)
Subjective:    Patient ID: Diane Mullins, female    DOB: 07/18/1933, 81 y.o.   MRN: [id number]  HPI  81YO female presents for follow up.  DM - BG running 80-256 with most in low 100s. Would like to change back to insulin and stop Januvia because of cost. Recent A1c was 9.6%.    Wt Readings from Last 3 Encounters:  06/17/15 204 lb (92.534 kg)  04/14/15 207 lb (93.895 kg)  03/30/15 207 lb 6 oz (94.065 kg)   BP Readings from Last 3 Encounters:  06/17/15 136/66  04/14/15 128/75  03/30/15 107/64    Past Medical History  Diagnosis Date  . Hyperlipidemia   . Hypertension   . Diabetes mellitus   . Thyroid disease   . H/O: rheumatic fever    Family History  Problem Relation Age of Onset  . Heart disease Mother 67  . Heart attack Mother 69  . Cancer Father     colon  . Diabetes Sister   . Hypertension Sister   . Cancer Brother     kidney  . Diabetes Brother    Past Surgical History  Procedure Laterality Date  . Vaginal delivery      x4  . Abdominal hysterectomy  1973    menorrhagia  . Hernia repair  1994   Social History   Social History  . Marital Status: Married    Spouse Name: N/A  . Number of Children: 4  . Years of Education: N/A   Social History Main Topics  . Smoking status: Never Smoker   . Smokeless tobacco: Never Used  . Alcohol Use: No  . Drug Use: No  . Sexual Activity: Not Asked   Other Topics Concern  . None   Social History Narrative   Lives in [place] alone. No pets. Retired from [employer].   Diet: healthy   Exercise: water aerobics twice weekly, balance class    Review of Systems  Constitutional: Negative for fever, chills, appetite change, fatigue and unexpected weight change.  Eyes: Negative for visual disturbance.  Respiratory: Negative for shortness of breath.   Cardiovascular: Negative for chest pain and leg swelling.  Gastrointestinal: Negative for nausea, vomiting, abdominal pain, diarrhea and constipation.  Musculoskeletal:  Positive for gait problem. Negative for myalgias and arthralgias.  Skin: Negative for color change and rash.  Hematological: Negative for adenopathy. Does not bruise/bleed easily.  Psychiatric/Behavioral: Negative for sleep disturbance and dysphoric mood. The patient is not nervous/anxious.        Objective:    BP 136/66 mmHg  Pulse 79  Temp(Src) 97.7 F (36.5 C) (Oral)  Ht 5' 0.5" (1.537 m)  Wt 204 lb (92.534 kg)  BMI 39.17 kg/m2  SpO2 94% Physical Exam  Constitutional: She is oriented to person, place, and time. She appears well-developed and well-nourished. No distress.  HENT:  Head: Normocephalic and atraumatic.  Right Ear: External ear normal.  Left Ear: External ear normal.  Nose: Nose normal.  Mouth/Throat: Oropharynx is clear and moist. No oropharyngeal exudate.  Eyes: Conjunctivae are normal. Pupils are equal, round, and reactive to light. Right eye exhibits no discharge. Left eye exhibits no discharge. No scleral icterus.  Neck: Normal range of motion. Neck supple. No tracheal deviation present. No thyromegaly present.  Cardiovascular: Normal rate, regular rhythm, normal heart sounds and intact distal pulses.  Exam reveals no gallop and no friction rub.   No murmur heard. Pulmonary/Chest: Effort normal and breath sounds normal. No respiratory distress.  She has no wheezes. She has no rales. She exhibits no tenderness.  Musculoskeletal: Normal range of motion. She exhibits no edema or tenderness.  Lymphadenopathy:    She has no cervical adenopathy.  Neurological: She is alert and oriented to person, place, and time. No cranial nerve deficit. She exhibits normal muscle tone. Coordination normal.  Skin: Skin is warm and dry. No rash noted. She is not diaphoretic. No erythema. No pallor.  Psychiatric: She has a normal mood and affect. Her behavior is normal. Judgment and thought content normal.          Assessment & Plan:   Problem List Items Addressed This Visit        Unprioritized   Adjustment disorder with anxious mood    Symptoms well controlled.Continue prn Alprazolam.      Relevant Medications   ALPRAZolam (XANAX) 0.25 MG tablet   Hyperlipidemia    Recent LFTs normal. Continue Simvastatin.      Hypertension    BP Readings from Last 3 Encounters:  06/17/15 136/66  04/14/15 128/75  03/30/15 107/64   BP well controlled. Continue current medications.      Type 2 diabetes mellitus, uncontrolled - Primary    BG high based on A1c. Unable to afford Januvia. Will stop Januvia. Start back on Lantus. Titrate up over next few weeks. She will call or email with BG readings then follow up in 4 weeks.      Relevant Medications   Insulin Glargine (LANTUS SOLOSTAR) 100 UNIT/ML Solostar Pen       Return in about 4 weeks (around 07/15/2015) for Recheck of Diabetes.

## 2015-06-17 NOTE — Assessment & Plan Note (Signed)
BG high based on A1c. Unable to afford Januvia. Will stop Januvia. Start back on Lantus. Titrate up over next few weeks. She will call or email with BG readings then follow up in 4 weeks.

## 2015-07-16 ENCOUNTER — Ambulatory Visit (INDEPENDENT_AMBULATORY_CARE_PROVIDER_SITE_OTHER): Payer: Commercial Managed Care - HMO | Admitting: Internal Medicine

## 2015-07-16 ENCOUNTER — Encounter: Payer: Self-pay | Admitting: Internal Medicine

## 2015-07-16 VITALS — BP 119/71 | HR 66 | Temp 97.7°F | Ht 61.0 in | Wt 202.4 lb

## 2015-07-16 DIAGNOSIS — Z794 Long term (current) use of insulin: Secondary | ICD-10-CM

## 2015-07-16 DIAGNOSIS — IMO0001 Reserved for inherently not codable concepts without codable children: Secondary | ICD-10-CM

## 2015-07-16 DIAGNOSIS — E785 Hyperlipidemia, unspecified: Secondary | ICD-10-CM | POA: Diagnosis not present

## 2015-07-16 DIAGNOSIS — I10 Essential (primary) hypertension: Secondary | ICD-10-CM | POA: Diagnosis not present

## 2015-07-16 DIAGNOSIS — E1165 Type 2 diabetes mellitus with hyperglycemia: Secondary | ICD-10-CM

## 2015-07-16 DIAGNOSIS — R5382 Chronic fatigue, unspecified: Secondary | ICD-10-CM | POA: Diagnosis not present

## 2015-07-16 NOTE — Assessment & Plan Note (Signed)
Some recent generalized fatigue. Exam normal today. Will check CBC, B12, ferritin and TSH with labs.

## 2015-07-16 NOTE — Assessment & Plan Note (Signed)
BP Readings from Last 3 Encounters:  07/16/15 119/71  06/17/15 136/66  04/14/15 128/75   BP well controlled. Renal function with labs in 3 months.

## 2015-07-16 NOTE — Progress Notes (Signed)
Pre visit review using our clinic review tool, if applicable. No additional management support is needed unless otherwise documented below in the visit note. 

## 2015-07-16 NOTE — Assessment & Plan Note (Signed)
Will check lipids with labs in 3 months. Continue Simvastatin.

## 2015-07-16 NOTE — Progress Notes (Signed)
Subjective:    Patient ID: Diane Mullins, female    DOB: 1933/06/17, 81 y.o.   MRN: [id number]  HPI  81YO female presents for follow up.  DM - BG mostly 80-120 fasting and near 120-150 after meals. Compliant with medications. Previous A1c was 9.1%.  Feeling more tired recently. Has a history of iron deficiency and low B12. Has not recently taken B12 supplements. No focal symptoms such as chest pain, dyspnea, change in bowel habits.  Wt Readings from Last 3 Encounters:  07/16/15 202 lb 6 oz (91.797 kg)  06/17/15 204 lb (92.534 kg)  04/14/15 207 lb (93.895 kg)   BP Readings from Last 3 Encounters:  07/16/15 119/71  06/17/15 136/66  04/14/15 128/75    Past Medical History  Diagnosis Date  . Hyperlipidemia   . Hypertension   . Diabetes mellitus   . Thyroid disease   . H/O: rheumatic fever    Family History  Problem Relation Age of Onset  . Heart disease Mother 60  . Heart attack Mother 68  . Cancer Father     colon  . Diabetes Sister   . Hypertension Sister   . Cancer Brother     kidney  . Diabetes Brother    Past Surgical History  Procedure Laterality Date  . Vaginal delivery      x4  . Abdominal hysterectomy  1973    menorrhagia  . Hernia repair  1994   Social History   Social History  . Marital Status: Married    Spouse Name: N/A  . Number of Children: 4  . Years of Education: N/A   Social History Main Topics  . Smoking status: Never Smoker   . Smokeless tobacco: Never Used  . Alcohol Use: No  . Drug Use: No  . Sexual Activity: Not Asked   Other Topics Concern  . None   Social History Narrative   Lives in [place] alone. No pets. Retired from [employer].   Diet: healthy   Exercise: water aerobics twice weekly, balance class    Review of Systems  Constitutional: Positive for fatigue. Negative for fever, chills, appetite change and unexpected weight change.  Eyes: Negative for visual disturbance.  Respiratory: Negative for shortness of breath.     Cardiovascular: Negative for chest pain and leg swelling.  Gastrointestinal: Negative for nausea, vomiting, abdominal pain, diarrhea, constipation, blood in stool and abdominal distention.  Musculoskeletal: Negative for myalgias and arthralgias.  Skin: Negative for color change and rash.  Neurological: Negative for weakness.  Hematological: Negative for adenopathy. Does not bruise/bleed easily.  Psychiatric/Behavioral: Negative for suicidal ideas, sleep disturbance and dysphoric mood. The patient is not nervous/anxious.        Objective:    BP 119/71 mmHg  Pulse 66  Temp(Src) 97.7 F (36.5 C) (Oral)  Ht 5\' 1"  (1.549 m)  Wt 202 lb 6 oz (91.797 kg)  BMI 38.26 kg/m2  SpO2 97% Physical Exam  Constitutional: She is oriented to person, place, and time. She appears well-developed and well-nourished. No distress.  HENT:  Head: Normocephalic and atraumatic.  Right Ear: External ear normal.  Left Ear: External ear normal.  Nose: Nose normal.  Mouth/Throat: Oropharynx is clear and moist. No oropharyngeal exudate.  Eyes: Conjunctivae are normal. Pupils are equal, round, and reactive to light. Right eye exhibits no discharge. Left eye exhibits no discharge. No scleral icterus.  Neck: Normal range of motion. Neck supple. No tracheal deviation present. No thyromegaly present.  Cardiovascular: Normal  rate, regular rhythm, normal heart sounds and intact distal pulses.  Exam reveals no gallop and no friction rub.   No murmur heard. Pulmonary/Chest: Effort normal and breath sounds normal. No respiratory distress. She has no wheezes. She has no rales. She exhibits no tenderness.  Musculoskeletal: Normal range of motion. She exhibits no edema or tenderness.  Lymphadenopathy:    She has no cervical adenopathy.  Neurological: She is alert and oriented to person, place, and time. No cranial nerve deficit. She exhibits normal muscle tone. Coordination normal.  Skin: Skin is warm and dry. No rash noted.  She is not diaphoretic. No erythema. No pallor.  Psychiatric: She has a normal mood and affect. Her behavior is normal. Judgment and thought content normal.          Assessment & Plan:   Problem List Items Addressed This Visit      Unprioritized   Chronic fatigue - Primary    Some recent generalized fatigue. Exam normal today. Will check CBC, B12, ferritin and TSH with labs.       Relevant Orders   CBC w/Diff   B12   Ferritin   TSH   Hyperlipidemia    Will check lipids with labs in 3 months. Continue Simvastatin.      Hypertension    BP Readings from Last 3 Encounters:  07/16/15 119/71  06/17/15 136/66  04/14/15 128/75   BP well controlled. Renal function with labs in 3 months.      Type 2 diabetes mellitus, uncontrolled (HCC)    BG much improved by her report. Plan to recheck A1c in 3 months.          Return in about 3 months (around 10/16/2015) for Physical.

## 2015-07-16 NOTE — Patient Instructions (Signed)
Labs and follow up in 3 months

## 2015-07-16 NOTE — Assessment & Plan Note (Signed)
BG much improved by her report. Plan to recheck A1c in 3 months.

## 2015-07-17 LAB — CBC WITH DIFFERENTIAL/PLATELET
BASOS: 1 %
Basophils Absolute: 0.1 10*3/uL (ref 0.0–0.2)
EOS (ABSOLUTE): 0.2 10*3/uL (ref 0.0–0.4)
EOS: 2 %
HEMATOCRIT: 39 % (ref 34.0–46.6)
HEMOGLOBIN: 12.7 g/dL (ref 11.1–15.9)
Immature Grans (Abs): 0 10*3/uL (ref 0.0–0.1)
Immature Granulocytes: 0 %
LYMPHS ABS: 3.4 10*3/uL — AB (ref 0.7–3.1)
Lymphs: 37 %
MCH: 28 pg (ref 26.6–33.0)
MCHC: 32.6 g/dL (ref 31.5–35.7)
MCV: 86 fL (ref 79–97)
MONOCYTES: 8 %
Monocytes Absolute: 0.7 10*3/uL (ref 0.1–0.9)
NEUTROS ABS: 4.8 10*3/uL (ref 1.4–7.0)
Neutrophils: 52 %
Platelets: 321 10*3/uL (ref 150–379)
RBC: 4.54 x10E6/uL (ref 3.77–5.28)
RDW: 15.7 % — ABNORMAL HIGH (ref 12.3–15.4)
WBC: 9.3 10*3/uL (ref 3.4–10.8)

## 2015-07-17 LAB — VITAMIN B12: Vitamin B-12: 349 pg/mL (ref 211–946)

## 2015-07-17 LAB — TSH: TSH: 3.06 u[IU]/mL (ref 0.450–4.500)

## 2015-07-17 LAB — FERRITIN: Ferritin: 25 ng/mL (ref 15–150)

## 2015-07-25 ENCOUNTER — Emergency Department: Payer: Commercial Managed Care - HMO

## 2015-07-25 ENCOUNTER — Emergency Department
Admission: EM | Admit: 2015-07-25 | Discharge: 2015-07-25 | Disposition: A | Discharge status: Home / Self Care | Payer: Commercial Managed Care - HMO | Attending: Emergency Medicine | Admitting: Emergency Medicine

## 2015-07-25 ENCOUNTER — Encounter: Payer: Self-pay | Admitting: Emergency Medicine

## 2015-07-25 DIAGNOSIS — Z794 Long term (current) use of insulin: Secondary | ICD-10-CM | POA: Insufficient documentation

## 2015-07-25 DIAGNOSIS — W1839XA Other fall on same level, initial encounter: Secondary | ICD-10-CM | POA: Insufficient documentation

## 2015-07-25 DIAGNOSIS — Z79899 Other long term (current) drug therapy: Secondary | ICD-10-CM | POA: Insufficient documentation

## 2015-07-25 DIAGNOSIS — Y9289 Other specified places as the place of occurrence of the external cause: Secondary | ICD-10-CM | POA: Diagnosis not present

## 2015-07-25 DIAGNOSIS — Z7982 Long term (current) use of aspirin: Secondary | ICD-10-CM | POA: Diagnosis not present

## 2015-07-25 DIAGNOSIS — E119 Type 2 diabetes mellitus without complications: Secondary | ICD-10-CM | POA: Insufficient documentation

## 2015-07-25 DIAGNOSIS — S5011XA Contusion of right forearm, initial encounter: Secondary | ICD-10-CM | POA: Insufficient documentation

## 2015-07-25 DIAGNOSIS — I10 Essential (primary) hypertension: Secondary | ICD-10-CM | POA: Diagnosis not present

## 2015-07-25 DIAGNOSIS — R55 Syncope and collapse: Secondary | ICD-10-CM | POA: Diagnosis present

## 2015-07-25 DIAGNOSIS — S40021A Contusion of right upper arm, initial encounter: Secondary | ICD-10-CM

## 2015-07-25 DIAGNOSIS — Y9389 Activity, other specified: Secondary | ICD-10-CM | POA: Insufficient documentation

## 2015-07-25 DIAGNOSIS — Y998 Other external cause status: Secondary | ICD-10-CM | POA: Insufficient documentation

## 2015-07-25 LAB — URINALYSIS COMPLETE WITH MICROSCOPIC (ARMC ONLY)
BILIRUBIN URINE: NEGATIVE
GLUCOSE, UA: 50 mg/dL — AB
HGB URINE DIPSTICK: NEGATIVE
Ketones, ur: NEGATIVE mg/dL
NITRITE: NEGATIVE
PH: 6 (ref 5.0–8.0)
Protein, ur: NEGATIVE mg/dL
SPECIFIC GRAVITY, URINE: 1.006 (ref 1.005–1.030)

## 2015-07-25 LAB — CBC
HEMATOCRIT: 39.6 % (ref 35.0–47.0)
Hemoglobin: 12.7 g/dL (ref 12.0–16.0)
MCH: 27.3 pg (ref 26.0–34.0)
MCHC: 32 g/dL (ref 32.0–36.0)
MCV: 85.4 fL (ref 80.0–100.0)
PLATELETS: 254 10*3/uL (ref 150–440)
RBC: 4.64 MIL/uL (ref 3.80–5.20)
RDW: 16.2 % — AB (ref 11.5–14.5)
WBC: 11.9 10*3/uL — AB (ref 3.6–11.0)

## 2015-07-25 LAB — BASIC METABOLIC PANEL
Anion gap: 6 (ref 5–15)
BUN: 17 mg/dL (ref 6–20)
CHLORIDE: 100 mmol/L — AB (ref 101–111)
CO2: 28 mmol/L (ref 22–32)
CREATININE: 1.09 mg/dL — AB (ref 0.44–1.00)
Calcium: 9.2 mg/dL (ref 8.9–10.3)
GFR, EST AFRICAN AMERICAN: 54 mL/min — AB (ref >60)
GFR, EST NON AFRICAN AMERICAN: 47 mL/min — AB (ref >60)
Glucose, Bld: 242 mg/dL — ABNORMAL HIGH (ref 65–99)
POTASSIUM: 4.1 mmol/L (ref 3.5–5.1)
SODIUM: 134 mmol/L — AB (ref 135–145)

## 2015-07-25 NOTE — ED Notes (Signed)
States she became dizzy last pm.  [place].  Having right arm pain and is still dizzy this am

## 2015-07-25 NOTE — ED Provider Notes (Signed)
[facility] Emergency Department Provider Note   ____________________________________________  Time seen: 10:15 AM I have reviewed the triage vital signs and the triage nursing note.  HISTORY  Chief Complaint Near Syncope   Historian Patient and daughter  HPI Diane Mullins is a 81 y.o. female who lives alone, but grandson states the nights with her frequently, who has a history of lightheadedness and dizziness over the past 1 year, as well as frequent falls, who was at a restaurant last evening and when she stood up she felt lightheaded and fell to the right side onto her right arm. She took Tylenol overnight, is still complaining of right arm pain which is moderate to severe especially when she tries to lift it up. No numbness. No weakness. No chest discomfort. No head injury or loss of consciousness. No neck or back pain.    Past Medical History  Diagnosis Date  . Hyperlipidemia   . Hypertension   . Diabetes mellitus   . Thyroid disease   . H/O: rheumatic fever     Patient Active Problem List   Diagnosis Date Noted  . Chronic fatigue 07/16/2015  . Adjustment disorder with anxious mood 03/30/2015  . Benign paroxysmal positional vertigo 07/17/2014  . Postmenopausal estrogen deficiency 06/03/2014  . DNR (do not resuscitate) discussion 03/04/2014  . Gait disturbance 05/07/2013  . Type 2 diabetes mellitus, uncontrolled ([place]) 04/21/2013  . Tachycardia 01/23/2013  . Medicare annual wellness visit, subsequent 12/09/2012  . Exertional dyspnea 12/09/2012  . Hearing loss 12/09/2012  . Anxiety 01/31/2012  . Hypertension 12/22/2011  . Hyperlipidemia 12/22/2011  . Hypothyroidism 12/22/2011    Past Surgical History  Procedure Laterality Date  . Vaginal delivery      x4  . Abdominal hysterectomy  1973    menorrhagia  . Hernia repair  1994    Current Outpatient Rx  Name  Route  Sig  Dispense  Refill  . ACCU-CHEK AVIVA PLUS test strip      PLEASE  USE AT HOME TO CHECK BLOOD SUGARS TWICE A DAY   200 each   12   . Alcohol Swabs PADS      Please use to cleanse skin before testing your blood sugars at home 1-2 times daily.   100 each   3   . ALPRAZolam (XANAX) 0.25 MG tablet   Oral   Take 1 tablet (0.25 mg total) by mouth 3 (three) times daily as needed for sleep or anxiety.   90 tablet   3   . aspirin EC 81 MG tablet   Oral   Take 81 mg by mouth daily.         . calcium-vitamin D (OSCAL) 250-125 MG-UNIT per tablet   Oral   Take 1 tablet by mouth daily.         . fish oil-omega-3 fatty acids 1000 MG capsule   Oral   Take 2 g by mouth daily.         . furosemide (LASIX) 20 MG tablet      TAKE 1 TABLET EVERY DAY   90 tablet   1   . glimepiride (AMARYL) 4 MG tablet      TAKE 1 TABLET EVERY MORNING  AND TAKE 1/2 TABLET EVERY EVENING   135 tablet   1   . Insulin Glargine (LANTUS SOLOSTAR) 100 UNIT/ML Solostar Pen   Subcutaneous   Inject 15 Units into the skin daily at 10 pm.   5 pen  PRN   . Insulin Pen Needle (B-D UF III MINI PEN NEEDLES) 31G X 5 MM MISC      Please use needles as directed once a day with your insulin pen. Dx code 250.00   100 each   5   . levothyroxine (SYNTHROID, LEVOTHROID) 75 MCG tablet      TAKE 1 TABLET EVERY DAY   90 tablet   1   . losartan-hydrochlorothiazide (HYZAAR) 50-12.5 MG per tablet      TAKE 1 TABLET EVERY DAY   90 tablet   1   . metFORMIN (GLUCOPHAGE) 1000 MG tablet      TAKE 1 TABLET TWICE DAILY  WITH  A  MEAL   180 tablet   1   . metoprolol succinate (TOPROL-XL) 100 MG 24 hr tablet   Oral   Take 1 tablet (100 mg total) by mouth daily.   90 tablet   3   . Multiple Vitamins-Minerals (MULTIVITAMIN WITH MINERALS) tablet   Oral   Take 1 tablet by mouth daily.         Marland Kitchen omeprazole (PRILOSEC) 20 MG capsule      TAKE 1 CAPSULE EVERY DAY   90 capsule   1   . simvastatin (ZOCOR) 20 MG tablet      TAKE 1 TABLET EVERY EVENING   90 tablet   1      Allergies Review of patient's allergies indicates no known allergies.  Family History  Problem Relation Age of Onset  . Heart disease Mother 28  . Heart attack Mother 24  . Cancer Father     colon  . Diabetes Sister   . Hypertension Sister   . Cancer Brother     kidney  . Diabetes Brother     Social History Social History  Substance Use Topics  . Smoking status: Never Smoker   . Smokeless tobacco: Never Used  . Alcohol Use: No    Review of Systems  Constitutional: Negative for fever. Eyes: Negative for visual changes. ENT: Negative for sore throat. Cardiovascular: Negative for chest pain. Respiratory: Negative for shortness of breath. Gastrointestinal: Negative for abdominal pain, vomiting and diarrhea. Genitourinary: Negative for dysuria. Musculoskeletal: Negative for back pain. Skin: Negative for rash. Neurological: Negative for headache. 10 point Review of Systems otherwise negative ____________________________________________   PHYSICAL EXAM:  VITAL SIGNS: ED Triage Vitals  Enc Vitals Group     BP 07/25/15 0929 145/74 mmHg     Pulse Rate 07/25/15 0929 78     Resp 07/25/15 0929 20     Temp 07/25/15 0929 97.7 F (36.5 C)     Temp Source 07/25/15 0929 Oral     SpO2 07/25/15 0929 94 %     Weight 07/25/15 0929 202 lb (91.627 kg)     Height 07/25/15 0929 5\' 2"  (1.575 m)     Head Cir --      Peak Flow --      Pain Score 07/25/15 0930 6     Pain Loc --      Pain Edu? --      Excl. in [place]? --      Constitutional: Alert and oriented. Well appearing and in no distress. Eyes: Conjunctivae are normal. PERRL. Normal extraocular movements. ENT   Head: Normocephalic and atraumatic.   Nose: No congestion/rhinnorhea.   Mouth/Throat: Mucous membranes are moist.   Neck: No stridor. Cardiovascular/Chest: Normal rate, regular rhythm.  No murmurs, rubs, or gallops. Respiratory: Normal respiratory effort  without tachypnea nor retractions. Breath  sounds are clear and equal bilaterally. No wheezes/rales/rhonchi. Gastrointestinal: Soft. No distention, no guarding, no rebound. Nontender  Genitourinary/rectal:Deferred Musculoskeletal: Pelvis stable. Hips nontender. No lower extremity tenderness. No chest wall tenderness. Ecchymosis right lower arm at the upper elbow. Slight tenderness at palpation elbow, with not much pain with range of motion of the elbow. Tenderness with active range of motion at the right shoulder. No bony point tenderness at the prominences at the shoulder. No left upper extremity pain.Marland Kitchen Neurologic:  Normal speech and language. No gross or focal neurologic deficits are appreciated. Skin:  Skin is warm, dry and intact. No rash noted. Psychiatric: Mood and affect are normal. Speech and behavior are normal. Patient exhibits appropriate insight and judgment.  ____________________________________________   EKG I, Lisa Roca, MD, the attending physician have personally viewed and interpreted all ECGs.  62 bpm. Normal sinus rhythm. Narrow QRS. Normal ST and T-wave. ____________________________________________  LABS (pertinent positives/negatives)  Urinalysis 3+ leukocytes, 6-30 white blood cells, rare bacteria, 6-30 squamous epithelial cells Metabolic panel significant for sodium 134 and chloride 100 and glucose 242 and creatinine 1.09 White blood count 11.9, hemoglobin 12.7 and per the count 54  ____________________________________________  RADIOLOGY All Xrays were viewed by me. Imaging interpreted by Radiologist.  Right shoulder: No acute bony findings Right elbow: No acute bony findings __________________________________________  PROCEDURES  Procedure(s) performed: None  Critical Care performed: None  ____________________________________________   ED COURSE / ASSESSMENT AND PLAN  CONSULTATIONS: None  Pertinent labs & imaging results that were available during my care of the patient were reviewed by  me and considered in my medical decision making (see chart for details).  The patient is here for evaluation of her right arm pain after a fall yesterday. It sounds like the reason she fell is that she stood up and felt lightheaded or dizzy, however this is a common and recurrent symptom for her over the past 1 year. No headache or neurologic deficit, and I do not suspect an intracranial emergency. I discussed with the family to continue follow-up for the dizziness with their PCP for possible MRI in the future if that is deemed necessary.  Urinalysis may be contamination given the squamous epithelial cells, the patient has no symptoms, and so I discussed treating versus urine culture, and with family decision chose to await urine culture.  Patient / Family / Caregiver informed of clinical course, medical decision-making process, and agree with plan.   I discussed return precautions, follow-up instructions, and discharged instructions with patient and/or family.  ___________________________________________   FINAL CLINICAL IMPRESSION(S) / ED DIAGNOSES   Final diagnoses:  Arm contusion, right, initial encounter       Lisa Roca, MD 07/25/15 1244

## 2015-07-25 NOTE — Discharge Instructions (Signed)
You were evaluated for right arm pain, and your exam and evaluation are reassuring and I suspect this is muscular bruising called contusion, however if you have pain that lasts longer than a week, it could be injury to the rotator cuff. At this point in time if you have continuing pain after 1 week, I recommend following up with an orthopedic doctor to consider possible rotator cuff injury.  In terms of the dizziness over the last several months, follow up with your primary care doctor for further evaluation. Your exam and evaluation are reassuring here in the emergency department today. Your urine sample looked contaminated, however it was sent for a urine culture. If this grows a urine infection, he will be called to start an antibiotic.  Return to the emergency department for any worsening condition including fever, chest pain, trouble breathing, weakness or numbness, problems with slurred speech or finding her words, abdominal pain, or any other symptoms concerning to you.   Contusion A contusion is a deep bruise. Contusions are the result of a blunt injury to tissues and muscle fibers under the skin. The injury causes bleeding under the skin. The skin overlying the contusion may turn blue, purple, or yellow. Minor injuries will give you a painless contusion, but more severe contusions may stay painful and swollen for a few weeks.  CAUSES  This condition is usually caused by a blow, trauma, or direct force to an area of the body. SYMPTOMS  Symptoms of this condition include:  Swelling of the injured area.  Pain and tenderness in the injured area.  Discoloration. The area may have redness and then turn blue, purple, or yellow. DIAGNOSIS  This condition is diagnosed based on a physical exam and medical history. An X-ray, CT scan, or MRI may be needed to determine if there are any associated injuries, such as broken bones (fractures). TREATMENT  Specific treatment for this condition depends on  what area of the body was injured. In general, the best treatment for a contusion is resting, icing, applying pressure to (compression), and elevating the injured area. This is often called the RICE strategy. Over-the-counter anti-inflammatory medicines may also be recommended for pain control.  HOME CARE INSTRUCTIONS   Rest the injured area.  If directed, apply ice to the injured area:  Put ice in a plastic bag.  Place a towel between your skin and the bag.  Leave the ice on for 20 minutes, 2-3 times per day.  If directed, apply light compression to the injured area using an elastic bandage. Make sure the bandage is not wrapped too tightly. Remove and reapply the bandage as directed by your health care provider.  If possible, raise (elevate) the injured area above the level of your heart while you are sitting or lying down.  Take over-the-counter and prescription medicines only as told by your health care provider. SEEK MEDICAL CARE IF:  Your symptoms do not improve after several days of treatment.  Your symptoms get worse.  You have difficulty moving the injured area. SEEK IMMEDIATE MEDICAL CARE IF:   You have severe pain.  You have numbness in a hand or foot.  Your hand or foot turns pale or cold.   This information is not intended to replace advice given to you by your health care provider. Make sure you discuss any questions you have with your health care provider.   Document Released: 06/14/2005 Document Revised: 05/26/2015 Document Reviewed: 01/20/2015 Elsevier Interactive Patient Education [employer].

## 2015-07-27 LAB — URINE CULTURE

## 2015-08-02 ENCOUNTER — Encounter: Payer: Self-pay | Admitting: Family Medicine

## 2015-08-02 ENCOUNTER — Ambulatory Visit (INDEPENDENT_AMBULATORY_CARE_PROVIDER_SITE_OTHER): Payer: Commercial Managed Care - HMO | Admitting: Family Medicine

## 2015-08-02 VITALS — BP 138/74 | HR 88 | Temp 98.2°F | Wt 203.0 lb

## 2015-08-02 DIAGNOSIS — R42 Dizziness and giddiness: Secondary | ICD-10-CM | POA: Diagnosis not present

## 2015-08-02 DIAGNOSIS — S4991XA Unspecified injury of right shoulder and upper arm, initial encounter: Secondary | ICD-10-CM | POA: Diagnosis not present

## 2015-08-02 DIAGNOSIS — I951 Orthostatic hypotension: Secondary | ICD-10-CM | POA: Insufficient documentation

## 2015-08-02 MED ORDER — LOSARTAN POTASSIUM 50 MG PO TABS: 50.0000 mg | ORAL_TABLET | Freq: Every day | ORAL | Status: DC

## 2015-08-02 NOTE — Patient Instructions (Addendum)
Nice to meet you. Your lightheadedness is likely due to orthostatic hypotension. We are going to stop your hydrochlorothiazide to allow your blood pressure to stay in a better range. Please check your blood pressure daily and let Diane Mullins know if it is higher than 150/90. Please stay well hydrated. Please schedule an appointment for one week with the nurse for a blood pressure check and lab work. If you develop chest pain, shortness of breath, palpitations, persistent lightheadedness, numbness, weakness, vision changes, headache, or any new symptoms or change in symptoms please seek medical attention immediately. Please do the range of motion and stretching exercises for her right shoulder that are listed below.  Impingement Syndrome, Rotator Cuff, Bursitis With Rehab Impingement syndrome is a condition that involves inflammation of the tendons of the rotator cuff and the subacromial bursa, that causes pain in the shoulder. The rotator cuff consists of four tendons and muscles that control much of the shoulder and upper arm function. The subacromial bursa is a fluid filled sac that helps reduce friction between the rotator cuff and one of the bones of the shoulder (acromion). Impingement syndrome is usually an overuse injury that causes swelling of the bursa (bursitis), swelling of the tendon (tendonitis), and/or a tear of the tendon (strain). Strains are classified into three categories. Grade 1 strains cause pain, but the tendon is not lengthened. Grade 2 strains include a lengthened ligament, due to the ligament being stretched or partially ruptured. With grade 2 strains there is still function, although the function may be decreased. Grade 3 strains include a complete tear of the tendon or muscle, and function is usually impaired. SYMPTOMS   Pain around the shoulder, often at the outer portion of the upper arm.  Pain that gets worse with shoulder function, especially when reaching overhead or  lifting.  Sometimes, aching when not using the arm.  Pain that wakes you up at night.  Sometimes, tenderness, swelling, warmth, or redness over the affected area.  Loss of strength.  Limited motion of the shoulder, especially reaching behind the back (to the back pocket or to unhook bra) or across your body.  Crackling sound (crepitation) when moving the arm.  Biceps tendon pain and inflammation (in the front of the shoulder). Worse when bending the elbow or lifting. CAUSES  Impingement syndrome is often an overuse injury, in which chronic (repetitive) motions cause the tendons or bursa to become inflamed. A strain occurs when a force is paced on the tendon or muscle that is greater than it can withstand. Common mechanisms of injury include: Stress from sudden increase in duration, frequency, or intensity of training.  Direct hit (trauma) to the shoulder.  Aging, erosion of the tendon with normal use.  Bony bump on shoulder (acromial spur). RISK INCREASES WITH:  Contact sports (football, wrestling, boxing).  Throwing sports (baseball, tennis, volleyball).  Weightlifting and bodybuilding.  Heavy labor.  Previous injury to the rotator cuff, including impingement.  Poor shoulder strength and flexibility.  Failure to warm up properly before activity.  Inadequate protective equipment.  Old age.  Bony bump on shoulder (acromial spur). PREVENTION   Warm up and stretch properly before activity.  Allow for adequate recovery between workouts.  Maintain physical fitness:  Strength, flexibility, and endurance.  Cardiovascular fitness.  Learn and use proper exercise technique. PROGNOSIS  If treated properly, impingement syndrome usually goes away within 6 weeks. Sometimes surgery is required.  RELATED COMPLICATIONS   Longer healing time if not properly treated, or if  not given enough time to heal.  Recurring symptoms, that result in a chronic condition.  Shoulder  stiffness, frozen shoulder, or loss of motion.  Rotator cuff tendon tear.  Recurring symptoms, especially if activity is resumed too soon, with overuse, with a direct blow, or when using poor technique. TREATMENT  Treatment first involves the use of ice and medicine, to reduce pain and inflammation. The use of strengthening and stretching exercises may help reduce pain with activity. These exercises may be performed at home or with a therapist. If non-surgical treatment is unsuccessful after more than 6 months, surgery may be advised. After surgery and rehabilitation, activity is usually possible in 3 months.  MEDICATION  If pain medicine is needed, nonsteroidal anti-inflammatory medicines (aspirin and ibuprofen), or other minor pain relievers (acetaminophen), are often advised.  Do not take pain medicine for 7 days before surgery.  Prescription pain relievers may be given, if your caregiver thinks they are needed. Use only as directed and only as much as you need.  Corticosteroid injections may be given by your caregiver. These injections should be reserved for the most serious cases, because they may only be given a certain number of times. HEAT AND COLD  Cold treatment (icing) should be applied for 10 to 15 minutes every 2 to 3 hours for inflammation and pain, and immediately after activity that aggravates your symptoms. Use ice packs or an ice massage.  Heat treatment may be used before performing stretching and strengthening activities prescribed by your caregiver, physical therapist, or athletic trainer. Use a heat pack or a warm water soak. SEEK MEDICAL CARE IF:   Symptoms get worse or do not improve in 4 to 6 weeks, despite treatment.  New, unexplained symptoms develop. (Drugs used in treatment may produce side effects.) EXERCISES  RANGE OF MOTION (ROM) AND STRETCHING EXERCISES - Impingement Syndrome (Rotator Cuff  Tendinitis, Bursitis) These exercises may help you when beginning  to rehabilitate your injury. Your symptoms may go away with or without further involvement from your physician, physical therapist or athletic trainer. While completing these exercises, remember:   Restoring tissue flexibility helps normal motion to return to the joints. This allows healthier, less painful movement and activity.  An effective stretch should be held for at least 30 seconds.  A stretch should never be painful. You should only feel a gentle lengthening or release in the stretched tissue. STRETCH - Flexion, Standing  Stand with good posture. With an underhand grip on your right / left hand, and an overhand grip on the opposite hand, grasp a broomstick or cane so that your hands are a little more than shoulder width apart.  Keeping your right / left elbow straight and shoulder muscles relaxed, push the stick with your opposite hand, to raise your right / left arm in front of your body and then overhead. Raise your arm until you feel a stretch in your right / left shoulder, but before you have increased shoulder pain.  Try to avoid shrugging your right / left shoulder as your arm rises, by keeping your shoulder blade tucked down and toward your mid-back spine. Hold for __________ seconds.  Slowly return to the starting position. Repeat __________ times. Complete this exercise __________ times per day. STRETCH - Abduction, Supine  Lie on your back. With an underhand grip on your right / left hand and an overhand grip on the opposite hand, grasp a broomstick or cane so that your hands are a little more than shoulder  width apart.  Keeping your right / left elbow straight and your shoulder muscles relaxed, push the stick with your opposite hand, to raise your right / left arm out to the side of your body and then overhead. Raise your arm until you feel a stretch in your right / left shoulder, but before you have increased shoulder pain.  Try to avoid shrugging your right / left shoulder  as your arm rises, by keeping your shoulder blade tucked down and toward your mid-back spine. Hold for __________ seconds.  Slowly return to the starting position. Repeat __________ times. Complete this exercise __________ times per day. ROM - Flexion, Active-Assisted  Lie on your back. You may bend your knees for comfort.  Grasp a broomstick or cane so your hands are about shoulder width apart. Your right / left hand should grip the end of the stick, so that your hand is positioned "thumbs-up," as if you were about to shake hands.  Using your healthy arm to lead, raise your right / left arm overhead, until you feel a gentle stretch in your shoulder. Hold for __________ seconds.  Use the stick to assist in returning your right / left arm to its starting position. Repeat __________ times. Complete this exercise __________ times per day.  ROM - Internal Rotation, Supine   Lie on your back on a firm surface. Place your right / left elbow about 60 degrees away from your side. Elevate your elbow with a folded towel, so that the elbow and shoulder are the same height.  Using a broomstick or cane and your strong arm, pull your right / left hand toward your body until you feel a gentle stretch, but no increase in your shoulder pain. Keep your shoulder and elbow in place throughout the exercise.  Hold for __________ seconds. Slowly return to the starting position. Repeat __________ times. Complete this exercise __________ times per day. STRETCH - Internal Rotation  Place your right / left hand behind your back, palm up.  Throw a towel or belt over your opposite shoulder. Grasp the towel with your right / left hand.  While keeping an upright posture, gently pull up on the towel, until you feel a stretch in the front of your right / left shoulder.  Avoid shrugging your right / left shoulder as your arm rises, by keeping your shoulder blade tucked down and toward your mid-back spine.  Hold for  __________ seconds. Release the stretch, by lowering your healthy hand. Repeat __________ times. Complete this exercise __________ times per day. ROM - Internal Rotation   Using an underhand grip, grasp a stick behind your back with both hands.  While standing upright with good posture, slide the stick up your back until you feel a mild stretch in the front of your shoulder.  Hold for __________ seconds. Slowly return to your starting position. Repeat __________ times. Complete this exercise __________ times per day.  STRETCH - Posterior Shoulder Capsule   Stand or sit with good posture. Grasp your right / left elbow and draw it across your chest, keeping it at the same height as your shoulder.  Pull your elbow, so your upper arm comes in closer to your chest. Pull until you feel a gentle stretch in the back of your shoulder.  Hold for __________ seconds. Repeat __________ times. Complete this exercise __________ times per day. STRENGTHENING EXERCISES - Impingement Syndrome (Rotator Cuff Tendinitis, Bursitis) These exercises may help you when beginning to rehabilitate your injury. They  may resolve your symptoms with or without further involvement from your physician, physical therapist or athletic trainer. While completing these exercises, remember:  Muscles can gain both the endurance and the strength needed for everyday activities through controlled exercises.  Complete these exercises as instructed by your physician, physical therapist or athletic trainer. Increase the resistance and repetitions only as guided.  You may experience muscle soreness or fatigue, but the pain or discomfort you are trying to eliminate should never worsen during these exercises. If this pain does get worse, stop and make sure you are following the directions exactly. If the pain is still present after adjustments, discontinue the exercise until you can discuss the trouble with your clinician.  During your  recovery, avoid activity or exercises which involve actions that place your injured hand or elbow above your head or behind your back or head. These positions stress the tissues which you are trying to heal. STRENGTH - Scapular Depression and Adduction   With good posture, sit on a firm chair. Support your arms in front of you, with pillows, arm rests, or on a table top. Have your elbows in line with the sides of your body.  Gently draw your shoulder blades down and toward your mid-back spine. Gradually increase the tension, without tensing the muscles along the top of your shoulders and the back of your neck.  Hold for __________ seconds. Slowly release the tension and relax your muscles completely before starting the next repetition.  After you have practiced this exercise, remove the arm support and complete the exercise in standing as well as sitting position. Repeat __________ times. Complete this exercise __________ times per day.  STRENGTH - Shoulder Abductors, Isometric  With good posture, stand or sit about 4-6 inches from a wall, with your right / left side facing the wall.  Bend your right / left elbow. Gently press your right / left elbow into the wall. Increase the pressure gradually, until you are pressing as hard as you can, without shrugging your shoulder or increasing any shoulder discomfort.  Hold for __________ seconds.  Release the tension slowly. Relax your shoulder muscles completely before you begin the next repetition. Repeat __________ times. Complete this exercise __________ times per day.  STRENGTH - External Rotators, Isometric  Keep your right / left elbow at your side and bend it 90 degrees.  Step into a door frame so that the outside of your right / left wrist can press against the door frame without your upper arm leaving your side.  Gently press your right / left wrist into the door frame, as if you were trying to swing the back of your hand away from your  stomach. Gradually increase the tension, until you are pressing as hard as you can, without shrugging your shoulder or increasing any shoulder discomfort.  Hold for __________ seconds.  Release the tension slowly. Relax your shoulder muscles completely before you begin the next repetition. Repeat __________ times. Complete this exercise __________ times per day.  STRENGTH - Supraspinatus   Stand or sit with good posture. Grasp a __________ weight, or an exercise band or tubing, so that your hand is "thumbs-up," like you are shaking hands.  Slowly lift your right / left arm in a "V" away from your thigh, diagonally into the space between your side and straight ahead. Lift your hand to shoulder height or as far as you can, without increasing any shoulder pain. At first, many people do not lift their hands  above shoulder height.  Avoid shrugging your right / left shoulder as your arm rises, by keeping your shoulder blade tucked down and toward your mid-back spine.  Hold for __________ seconds. Control the descent of your hand, as you slowly return to your starting position. Repeat __________ times. Complete this exercise __________ times per day.  STRENGTH - External Rotators  Secure a rubber exercise band or tubing to a fixed object (table, pole) so that it is at the same height as your right / left elbow when you are standing or sitting on a firm surface.  Stand or sit so that the secured exercise band is at your uninjured side.  Bend your right / left elbow 90 degrees. Place a folded towel or small pillow under your right / left arm, so that your elbow is a few inches away from your side.  Keeping the tension on the exercise band, pull it away from your body, as if pivoting on your elbow. Be sure to keep your body steady, so that the movement is coming only from your rotating shoulder.  Hold for __________ seconds. Release the tension in a controlled manner, as you return to the starting  position. Repeat __________ times. Complete this exercise __________ times per day.  STRENGTH - Internal Rotators   Secure a rubber exercise band or tubing to a fixed object (table, pole) so that it is at the same height as your right / left elbow when you are standing or sitting on a firm surface.  Stand or sit so that the secured exercise band is at your right / left side.  Bend your elbow 90 degrees. Place a folded towel or small pillow under your right / left arm so that your elbow is a few inches away from your side.  Keeping the tension on the exercise band, pull it across your body, toward your stomach. Be sure to keep your body steady, so that the movement is coming only from your rotating shoulder.  Hold for __________ seconds. Release the tension in a controlled manner, as you return to the starting position. Repeat __________ times. Complete this exercise __________ times per day.  STRENGTH - Scapular Protractors, Standing   Stand arms length away from a wall. Place your hands on the wall, keeping your elbows straight.  Begin by dropping your shoulder blades down and toward your mid-back spine.  To strengthen your protractors, keep your shoulder blades down, but slide them forward on your rib cage. It will feel as if you are lifting the back of your rib cage away from the wall. This is a subtle motion and can be challenging to complete. Ask your caregiver for further instruction, if you are not sure you are doing the exercise correctly.  Hold for __________ seconds. Slowly return to the starting position, resting the muscles completely before starting the next repetition. Repeat __________ times. Complete this exercise __________ times per day. STRENGTH - Scapular Protractors, Supine  Lie on your back on a firm surface. Extend your right / left arm straight into the air while holding a __________ weight in your hand.  Keeping your head and back in place, lift your shoulder off  the floor.  Hold for __________ seconds. Slowly return to the starting position, and allow your muscles to relax completely before starting the next repetition. Repeat __________ times. Complete this exercise __________ times per day. STRENGTH - Scapular Protractors, Quadruped  Get onto your hands and knees, with your shoulders directly over  your hands (or as close as you can be, comfortably).  Keeping your elbows locked, lift the back of your rib cage up into your shoulder blades, so your mid-back rounds out. Keep your neck muscles relaxed.  Hold this position for __________ seconds. Slowly return to the starting position and allow your muscles to relax completely before starting the next repetition. Repeat __________ times. Complete this exercise __________ times per day.  STRENGTH - Scapular Retractors  Secure a rubber exercise band or tubing to a fixed object (table, pole), so that it is at the height of your shoulders when you are either standing, or sitting on a firm armless chair.  With a palm down grip, grasp an end of the band in each hand. Straighten your elbows and lift your hands straight in front of you, at shoulder height. Step back, away from the secured end of the band, until it becomes tense.  Squeezing your shoulder blades together, draw your elbows back toward your sides, as you bend them. Keep your upper arms lifted away from your body throughout the exercise.  Hold for __________ seconds. Slowly ease the tension on the band, as you reverse the directions and return to the starting position. Repeat __________ times. Complete this exercise __________ times per day. STRENGTH - Shoulder Extensors   Secure a rubber exercise band or tubing to a fixed object (table, pole) so that it is at the height of your shoulders when you are either standing, or sitting on a firm armless chair.  With a thumbs-up grip, grasp an end of the band in each hand. Straighten your elbows and lift  your hands straight in front of you, at shoulder height. Step back, away from the secured end of the band, until it becomes tense.  Squeezing your shoulder blades together, pull your hands down to the sides of your thighs. Do not allow your hands to go behind you.  Hold for __________ seconds. Slowly ease the tension on the band, as you reverse the directions and return to the starting position. Repeat __________ times. Complete this exercise __________ times per day.  STRENGTH - Scapular Retractors and External Rotators   Secure a rubber exercise band or tubing to a fixed object (table, pole) so that it is at the height as your shoulders, when you are either standing, or sitting on a firm armless chair.  With a palm down grip, grasp an end of the band in each hand. Bend your elbows 90 degrees and lift your elbows to shoulder height, at your sides. Step back, away from the secured end of the band, until it becomes tense.  Squeezing your shoulder blades together, rotate your shoulders so that your upper arms and elbows remain stationary, but your fists travel upward to head height.  Hold for __________ seconds. Slowly ease the tension on the band, as you reverse the directions and return to the starting position. Repeat __________ times. Complete this exercise __________ times per day.  STRENGTH - Scapular Retractors and External Rotators, Rowing   Secure a rubber exercise band or tubing to a fixed object (table, pole) so that it is at the height of your shoulders, when you are either standing, or sitting on a firm armless chair.  With a palm down grip, grasp an end of the band in each hand. Straighten your elbows and lift your hands straight in front of you, at shoulder height. Step back, away from the secured end of the band, until it becomes tense.  Step 1: Squeeze your shoulder blades together. Bending your elbows, draw your hands to your chest, as if you are rowing a boat. At the end of  this motion, your hands and elbow should be at shoulder height and your elbows should be out to your sides.  Step 2: Rotate your shoulders, to raise your hands above your head. Your forearms should be vertical and your upper arms should be horizontal.  Hold for __________ seconds. Slowly ease the tension on the band, as you reverse the directions and return to the starting position. Repeat __________ times. Complete this exercise __________ times per day.  STRENGTH - Scapular Depressors  Find a sturdy chair without wheels, such as a dining room chair.  Keeping your feet on the floor, and your hands on the chair arms, lift your bottom up from the seat, and lock your elbows.  Keeping your elbows straight, allow gravity to pull your body weight down. Your shoulders will rise toward your ears.  Raise your body against gravity by drawing your shoulder blades down your back, shortening the distance between your shoulders and ears. Although your feet should always maintain contact with the floor, your feet should progressively support less body weight, as you get stronger.  Hold for __________ seconds. In a controlled and slow manner, lower your body weight to begin the next repetition. Repeat __________ times. Complete this exercise __________ times per day.    This information is not intended to replace advice given to you by your health care provider. Make sure you discuss any questions you have with your health care provider.   Document Released: 09/04/2005 Document Revised: 09/25/2014 Document Reviewed: 12/17/2008 [employer] Interactive Patient Education [employer].

## 2015-08-02 NOTE — Assessment & Plan Note (Signed)
Patient with fall and injury to her right shoulder and elbow. She had no loss of consciousness or head injury. She had imaging done in the ED and reports that her symptoms have improved since that time. This could potentially be a mild rotator cuff injury given that she has discomfort on empty can testing. She does have full range of motion. She is neurovascularly intact in her upper extremities. We'll treat her with range of motion and stretching exercises for possible rotator cuff impingement. She will continue to monitor. Given return precautions.

## 2015-08-02 NOTE — Progress Notes (Signed)
Patient ID: Diane Mullins, female   DOB: 07-11-1933, 81 y.o.   MRN: [id number]  Diane Rumps, MD Phone: [phone]  Diane Mullins is a 81 y.o. female who presents today for follow-up.  Patient was seen in the emergency room over the weekend. She notes she fell on Saturday evening. She was sitting down having dinner with friends a restaurant. She notes she got up and walked several steps and then became lightheaded and fell. She did not lose consciousness. She landed on her right shoulder and elbow. She had no other symptoms with this. She had no numbness, weakness, headache, vision changes, chest pain, shortness of breath, or palpitations. She did not hit her head. She notes she had discomfort in her right shoulder and elbow throughout the evening on Saturday night and went to the emergency room on Sunday. She was evaluated with x-rays of the shoulder and elbow that did not reveal any fracture. She had an EKG done and labs all of which did not reveal any cause for her lightheadedness. EKG report is in the EDP note, there is no copy scanned in the computer. She notes lightheadedness has been intermittent for the past year. It only occurs when she goes from laying to standing or seated to standing. She has no other symptoms with this. She has not had a syncopal event. It is not vertigo. Patient also noted that they did a urinalysis and sent a urine culture. She has not had any dysuria, frequency, or urgency. She has not had any abdominal pain. Urine culture grew out multiple morphotypes and suggested re-collection if appropriate.  PMH: nonsmoker.   ROS see history of present illness  Objective  Physical Exam Filed Vitals:   08/02/15 1040  BP: 138/74  Pulse: 88  Temp: 98.2 F (36.8 C)   Laying blood pressure 146/84 pulse 85 Sitting blood pressure 148/88 pulse 95 Standing blood pressure 128/78 pulse 110  Physical Exam  Constitutional: She is well-developed, well-nourished, and in no  distress.  HENT:  Head: Normocephalic and atraumatic.  Right Ear: External ear normal.  Left Ear: External ear normal.  Mouth/Throat: Oropharynx is clear and moist. No oropharyngeal exudate.  Eyes: Conjunctivae are normal. Pupils are equal, round, and reactive to light.  Neck: Neck supple.  Cardiovascular: Normal rate, regular rhythm and normal heart sounds.  Exam reveals no gallop and no friction rub.   No murmur heard. No carotid bruits  Pulmonary/Chest: Effort normal and breath sounds normal. No respiratory distress. She has no wheezes. She has no rales.  Musculoskeletal:  Right shoulder and arm with no tenderness to palpation, there is full shoulder and elbow range of motion passively and actively, mild discomfort in the right shoulder on empty can testing, hands warm and well-perfused  Lymphadenopathy:    She has no cervical adenopathy.  Neurological: She is alert.  CN 2-12 intact, 5/5 strength in bilateral biceps, triceps, grip, quads, hamstrings, plantar and dorsiflexion, sensation to light touch intact in bilateral UE and LE, normal gait, 2+ patellar reflexes  Skin: Skin is warm and dry. She is not diaphoretic.     Assessment/Plan: Please see individual problem list.  Orthostatic hypotension History and orthostatic vital signs in the office today are consistent with orthostatic hypotension. She does not have any other symptoms when this occurs. Only has symptoms when she goes from laying or seated to standing. She had a normal EKG in the ED. She had normal hemoglobin as well. Discussed this is likely due to  her blood pressure dropping too much when rising. We will stop her HCTZ today. She was sent in a new prescription of losartan. Did discuss that we would need to repeat her electrolytes and kidney function and CBC and recommended doing this today, though patient opted to do this in 1 week when she returns for nurse visit for blood pressure check. She was advised to check her blood  pressure at home and let us know if it begins to be greater than 150/90. She was advised to monitor for other symptoms and persistent lightheadedness. She will return in 1 week for blood pressure check and lab work. Given return precautions.  Right shoulder injury Patient with fall and injury to her right shoulder and elbow. She had no loss of consciousness or head injury. She had imaging done in the ED and reports that her symptoms have improved since that time. This could potentially be a mild rotator cuff injury given that she has discomfort on empty can testing. She does have full range of motion. She is neurovascularly intact in her upper extremities. We'll treat her with range of motion and stretching exercises for possible rotator cuff impingement. She will continue to monitor. Given return precautions.   discussed re-collecting urine for the patient and running another urine culture. Discussed that given that she has no symptoms I'm not sure this is necessary. She agreed with this and will continue to monitor for symptoms. Given return precautions.  Orders Placed This Encounter  Procedures  . Basic Metabolic Panel (BMET)    Standing Status: Future     Number of Occurrences:      Standing Expiration Date: 08/01/2016  . CBC    Standing Status: Future     Number of Occurrences:      Standing Expiration Date: 08/01/2016    Meds ordered this encounter  Medications  . losartan (COZAAR) 50 MG tablet    Sig: Take 1 tablet (50 mg total) by mouth daily.    Dispense:  90 tablet    Refill:  0    Diane Mullins

## 2015-08-02 NOTE — Assessment & Plan Note (Addendum)
History and orthostatic vital signs in the office today are consistent with orthostatic hypotension. She does not have any other symptoms when this occurs. Only has symptoms when she goes from laying or seated to standing. She had a normal EKG in the ED. She had normal hemoglobin as well. Discussed this is likely due to her blood pressure dropping too much when rising. We will stop her HCTZ today. She was sent in a new prescription of losartan. Did discuss that we would need to repeat her electrolytes and kidney function and CBC and recommended doing this today, though patient opted to do this in 1 week when she returns for nurse visit for blood pressure check. She was advised to check her blood pressure at home and let us know if it begins to be greater than 150/90. She was advised to monitor for other symptoms and persistent lightheadedness. She will return in 1 week for blood pressure check and lab work. Given return precautions.

## 2015-08-02 NOTE — Progress Notes (Signed)
Pre visit review using our clinic review tool, if applicable. No additional management support is needed unless otherwise documented below in the visit note. 

## 2015-08-03 ENCOUNTER — Telehealth: Payer: Self-pay | Admitting: Internal Medicine

## 2015-08-03 ENCOUNTER — Other Ambulatory Visit: Payer: Self-pay

## 2015-08-03 ENCOUNTER — Telehealth: Payer: Self-pay | Admitting: Family Medicine

## 2015-08-03 MED ORDER — LOSARTAN POTASSIUM 50 MG PO TABS: 50.0000 mg | ORAL_TABLET | Freq: Every day | ORAL | Status: DC

## 2015-08-03 NOTE — Telephone Encounter (Signed)
Pt called about her medication losartan (COZAAR) 50 MG tablet that was not at the pharmacy. Pharmacy is [id number] [address], [place]. Thank You!

## 2015-08-03 NOTE — Telephone Encounter (Signed)
Looks like it was sent to [facility] yesterday, resent to [facility] per patient's request.

## 2015-08-10 ENCOUNTER — Other Ambulatory Visit: Payer: Commercial Managed Care - HMO

## 2015-08-17 ENCOUNTER — Ambulatory Visit (INDEPENDENT_AMBULATORY_CARE_PROVIDER_SITE_OTHER): Payer: Commercial Managed Care - HMO

## 2015-08-17 VITALS — BP 140/78 | HR 84 | Resp 18

## 2015-08-17 DIAGNOSIS — I10 Essential (primary) hypertension: Secondary | ICD-10-CM | POA: Diagnosis not present

## 2015-08-17 NOTE — Progress Notes (Signed)
Patient came in for BP check.  States that she feels great since you took her off the HCTZ medications.  Check BP in bilateral upper extremities.  See vitals for details.  She has not checked her BP at home as she needs a new cuff.   Patient states she has an appointment on Thursday for a follow up.    Please advise.

## 2015-08-17 NOTE — Progress Notes (Signed)
OK. Will recheck again Thursday

## 2015-08-19 ENCOUNTER — Other Ambulatory Visit: Payer: Self-pay | Admitting: Internal Medicine

## 2015-08-19 ENCOUNTER — Ambulatory Visit (INDEPENDENT_AMBULATORY_CARE_PROVIDER_SITE_OTHER): Payer: Commercial Managed Care - HMO

## 2015-08-19 VITALS — BP 138/72 | HR 82 | Temp 97.4°F | Resp 14 | Ht 60.5 in | Wt 207.0 lb

## 2015-08-19 DIAGNOSIS — Z1211 Encounter for screening for malignant neoplasm of colon: Secondary | ICD-10-CM | POA: Diagnosis not present

## 2015-08-19 DIAGNOSIS — Z0000 Encounter for general adult medical examination without abnormal findings: Secondary | ICD-10-CM

## 2015-08-19 NOTE — Patient Instructions (Addendum)
Diane Mullins,  Thank you for taking time to come for your Medicare Wellness Visit.  I appreciate your ongoing commitment to your health goals. Please review the following plan we discussed and let me know if I can assist you in the future.  Return in January for scheduled physical with PCP.  Bring a copy of Advanced Directives.  Return IFOB test as soon as possible.  Complete lab work at [facility].     [employer], Female Adopting a healthy lifestyle and getting preventive care can go a long way to promote health and wellness. Talk with your health care provider about what schedule of regular examinations is right for you. This is a good chance for you to check in with your provider about disease prevention and staying healthy. In between checkups, there are plenty of things you can do on your own. Experts have done a lot of research about which lifestyle changes and preventive measures are most likely to keep you healthy. Ask your health care provider for more information. WEIGHT AND DIET  Eat a healthy diet  Be sure to include plenty of vegetables, fruits, low-fat dairy products, and lean protein.  Do not eat a lot of foods high in solid fats, added sugars, or salt.  Get regular exercise. This is one of the most important things you can do for your health.  Most adults should exercise for at least 150 minutes each week. The exercise should increase your heart rate and make you sweat (moderate-intensity exercise).  Most adults should also do strengthening exercises at least twice a week. This is in addition to the moderate-intensity exercise.  Maintain a healthy weight  Body mass index (BMI) is a measurement that can be used to identify possible weight problems. It estimates body fat based on height and weight. Your health care provider can help determine your BMI and help you achieve or maintain a healthy weight.  For females 70 years of age and older:   A BMI below 18.5 is  considered underweight.  A BMI of 18.5 to 24.9 is normal.  A BMI of 25 to 29.9 is considered overweight.  A BMI of 30 and above is considered obese.  Watch levels of cholesterol and blood lipids  You should start having your blood tested for lipids and cholesterol at 79 years of age, then have this test every 5 years.  You may need to have your cholesterol levels checked more often if:  Your lipid or cholesterol levels are high.  You are older than 79 years of age.  You are at high risk for heart disease.  CANCER SCREENING   Lung Cancer  Lung cancer screening is recommended for adults 72-75 years old who are at high risk for lung cancer because of a history of smoking.  A yearly low-dose CT scan of the lungs is recommended for people who:  Currently smoke.  Have quit within the past 15 years.  Have at least a 30-pack-year history of smoking. A pack year is smoking an average of one pack of cigarettes a day for 1 year.  Yearly screening should continue until it has been 15 years since you quit.  Yearly screening should stop if you develop a health problem that would prevent you from having lung cancer treatment.  Breast Cancer  Practice breast self-awareness. This means understanding how your breasts normally appear and feel.  It also means doing regular breast self-exams. Let your health care provider know about any changes,  no matter how small.  If you are in your 20s or 30s, you should have a clinical breast exam (CBE) by a health care provider every 1-3 years as part of a regular health exam.  If you are 6 or older, have a CBE every year. Also consider having a breast X-ray (mammogram) every year.  If you have a family history of breast cancer, talk to your health care provider about genetic screening.  If you are at high risk for breast cancer, talk to your health care provider about having an MRI and a mammogram every year.  Breast cancer gene (BRCA)  assessment is recommended for women who have family members with BRCA-related cancers. BRCA-related cancers include:  Breast.  Ovarian.  Tubal.  Peritoneal cancers.  Results of the assessment will determine the need for genetic counseling and BRCA1 and BRCA2 testing. Cervical Cancer Your health care provider may recommend that you be screened regularly for cancer of the pelvic organs (ovaries, uterus, and vagina). This screening involves a pelvic examination, including checking for microscopic changes to the surface of your cervix (Pap test). You may be encouraged to have this screening done every 3 years, beginning at age 45.  For women ages 76-65, health care providers may recommend pelvic exams and Pap testing every 3 years, or they may recommend the Pap and pelvic exam, combined with testing for human papilloma virus (HPV), every 5 years. Some types of HPV increase your risk of cervical cancer. Testing for HPV may also be done on women of any age with unclear Pap test results.  Other health care providers may not recommend any screening for nonpregnant women who are considered low risk for pelvic cancer and who do not have symptoms. Ask your health care provider if a screening pelvic exam is right for you.  If you have had past treatment for cervical cancer or a condition that could lead to cancer, you need Pap tests and screening for cancer for at least 20 years after your treatment. If Pap tests have been discontinued, your risk factors (such as having a new sexual partner) need to be reassessed to determine if screening should resume. Some women have medical problems that increase the chance of getting cervical cancer. In these cases, your health care provider may recommend more frequent screening and Pap tests. Colorectal Cancer  This type of cancer can be detected and often prevented.  Routine colorectal cancer screening usually begins at 79 years of age and continues through 79 years  of age.  Your health care provider may recommend screening at an earlier age if you have risk factors for colon cancer.  Your health care provider may also recommend using home test kits to check for hidden blood in the stool.  A small camera at the end of a tube can be used to examine your colon directly (sigmoidoscopy or colonoscopy). This is done to check for the earliest forms of colorectal cancer.  Routine screening usually begins at age 72.  Direct examination of the colon should be repeated every 5-10 years through 79 years of age. However, you may need to be screened more often if early forms of precancerous polyps or small growths are found. Skin Cancer  Check your skin from head to toe regularly.  Tell your health care provider about any new moles or changes in moles, especially if there is a change in a mole's shape or color.  Also tell your health care provider if you have a mole  that is larger than the size of a pencil eraser.  Always use sunscreen. Apply sunscreen liberally and repeatedly throughout the day.  Protect yourself by wearing long sleeves, pants, a wide-brimmed hat, and sunglasses whenever you are outside. HEART DISEASE, DIABETES, AND HIGH BLOOD PRESSURE   High blood pressure causes heart disease and increases the risk of stroke. High blood pressure is more likely to develop in:  People who have blood pressure in the high end of the normal range (130-139/85-89 mm Hg).  People who are overweight or obese.  People who are African American.  If you are 63-55 years of age, have your blood pressure checked every 3-5 years. If you are 48 years of age or older, have your blood pressure checked every year. You should have your blood pressure measured twice--once when you are at a hospital or clinic, and once when you are not at a hospital or clinic. Record the average of the two measurements. To check your blood pressure when you are not at a hospital or clinic, you  can use:  An automated blood pressure machine at a pharmacy.  A home blood pressure monitor.  If you are between 35 years and 65 years old, ask your health care provider if you should take aspirin to prevent strokes.  Have regular diabetes screenings. This involves taking a blood sample to check your fasting blood sugar level.  If you are at a normal weight and have a low risk for diabetes, have this test once every three years after 79 years of age.  If you are overweight and have a high risk for diabetes, consider being tested at a younger age or more often. PREVENTING INFECTION  Hepatitis B  If you have a higher risk for hepatitis B, you should be screened for this virus. You are considered at high risk for hepatitis B if:  You were born in a country where hepatitis B is common. Ask your health care provider which countries are considered high risk.  Your parents were born in a high-risk country, and you have not been immunized against hepatitis B (hepatitis B vaccine).  You have HIV or AIDS.  You use needles to inject street drugs.  You live with someone who has hepatitis B.  You have had sex with someone who has hepatitis B.  You get hemodialysis treatment.  You take certain medicines for conditions, including cancer, organ transplantation, and autoimmune conditions. Hepatitis C  Blood testing is recommended for:  Everyone born from [birth year] through 1963.  Anyone with known risk factors for hepatitis C. Sexually transmitted infections (STIs)  You should be screened for sexually transmitted infections (STIs) including gonorrhea and chlamydia if:  You are sexually active and are younger than 79 years of age.  You are older than 79 years of age and your health care provider tells you that you are at risk for this type of infection.  Your sexual activity has changed since you were last screened and you are at an increased risk for chlamydia or gonorrhea. Ask your health  care provider if you are at risk.  If you do not have HIV, but are at risk, it may be recommended that you take a prescription medicine daily to prevent HIV infection. This is called pre-exposure prophylaxis (PrEP). You are considered at risk if:  You are sexually active and do not regularly use condoms or know the HIV status of your partner(s).  You take drugs by injection.  You are sexually  active with a partner who has HIV. Talk with your health care provider about whether you are at high risk of being infected with HIV. If you choose to begin PrEP, you should first be tested for HIV. You should then be tested every 3 months for as long as you are taking PrEP.  PREGNANCY   If you are premenopausal and you may become pregnant, ask your health care provider about preconception counseling.  If you may become pregnant, take 400 to 800 micrograms (mcg) of folic acid every day.  If you want to prevent pregnancy, talk to your health care provider about birth control (contraception). OSTEOPOROSIS AND MENOPAUSE   Osteoporosis is a disease in which the bones lose minerals and strength with aging. This can result in serious bone fractures. Your risk for osteoporosis can be identified using a bone density scan.  If you are 19 years of age or older, or if you are at risk for osteoporosis and fractures, ask your health care provider if you should be screened.  Ask your health care provider whether you should take a calcium or vitamin D supplement to lower your risk for osteoporosis.  Menopause may have certain physical symptoms and risks.  Hormone replacement therapy may reduce some of these symptoms and risks. Talk to your health care provider about whether hormone replacement therapy is right for you.  HOME CARE INSTRUCTIONS   Schedule regular health, dental, and eye exams.  Stay current with your immunizations.   Do not use any tobacco products including cigarettes, chewing tobacco, or  electronic cigarettes.  If you are pregnant, do not drink alcohol.  If you are breastfeeding, limit how much and how often you drink alcohol.  Limit alcohol intake to no more than 1 drink per day for nonpregnant women. One drink equals 12 ounces of beer, 5 ounces of wine, or 1 ounces of hard liquor.  Do not use street drugs.  Do not share needles.  Ask your health care provider for help if you need support or information about quitting drugs.  Tell your health care provider if you often feel depressed.  Tell your health care provider if you have ever been abused or do not feel safe at home.   This information is not intended to replace advice given to you by your health care provider. Make sure you discuss any questions you have with your health care provider.   Document Released: 03/20/2011 Document Revised: 09/25/2014 Document Reviewed: 08/06/2013 Elsevier Interactive Patient Education 2016 [place] in the Home  Falls can cause injuries. They can happen to people of all ages. There are many things you can do to make your home safe and to help prevent falls.  WHAT CAN I DO ON THE OUTSIDE OF MY HOME?  Regularly fix the edges of walkways and driveways and fix any cracks.  Remove anything that might make you trip as you walk through a door, such as a raised step or threshold.  Trim any bushes or trees on the path to your home.  Use bright outdoor lighting.  Clear any walking paths of anything that might make someone trip, such as rocks or tools.  Regularly check to see if handrails are loose or broken. Make sure that both sides of any steps have handrails.  Any raised decks and porches should have guardrails on the edges.  Have any leaves, snow, or ice cleared regularly.  Use sand or salt on walking paths during winter.  Clean up any spills in your garage right away. This includes oil or grease spills. WHAT CAN I DO IN THE BATHROOM?   Use night  lights.  Install grab bars by the toilet and in the tub and shower. Do not use towel bars as grab bars.  Use non-skid mats or decals in the tub or shower.  If you need to sit down in the shower, use a plastic, non-slip stool.  Keep the floor dry. Clean up any water that spills on the floor as soon as it happens.  Remove soap buildup in the tub or shower regularly.  Attach bath mats securely with double-sided non-slip rug tape.  Do not have throw rugs and other things on the floor that can make you trip. WHAT CAN I DO IN THE BEDROOM?  Use night lights.  Make sure that you have a light by your bed that is easy to reach.  Do not use any sheets or blankets that are too big for your bed. They should not hang down onto the floor.  Have a firm chair that has side arms. You can use this for support while you get dressed.  Do not have throw rugs and other things on the floor that can make you trip. WHAT CAN I DO IN THE KITCHEN?  Clean up any spills right away.  Avoid walking on wet floors.  Keep items that you use a lot in easy-to-reach places.  If you need to reach something above you, use a strong step stool that has a grab bar.  Keep electrical cords out of the way.  Do not use floor polish or wax that makes floors slippery. If you must use wax, use non-skid floor wax.  Do not have throw rugs and other things on the floor that can make you trip. WHAT CAN I DO WITH MY STAIRS?  Do not leave any items on the stairs.  Make sure that there are handrails on both sides of the stairs and use them. Fix handrails that are broken or loose. Make sure that handrails are as long as the stairways.  Check any carpeting to make sure that it is firmly attached to the stairs. Fix any carpet that is loose or worn.  Avoid having throw rugs at the top or bottom of the stairs. If you do have throw rugs, attach them to the floor with carpet tape.  Make sure that you have a light switch at the  top of the stairs and the bottom of the stairs. If you do not have them, ask someone to add them for you. WHAT ELSE CAN I DO TO HELP PREVENT FALLS?  Wear shoes that:  Do not have high heels.  Have rubber bottoms.  Are comfortable and fit you well.  Are closed at the toe. Do not wear sandals.  If you use a stepladder:  Make sure that it is fully opened. Do not climb a closed stepladder.  Make sure that both sides of the stepladder are locked into place.  Ask someone to hold it for you, if possible.  Clearly mark and make sure that you can see:  Any grab bars or handrails.  First and last steps.  Where the edge of each step is.  Use tools that help you move around (mobility aids) if they are needed. These include:  Canes.  Walkers.  Scooters.  Crutches.  Turn on the lights when you go into a dark area. Replace any light bulbs as  soon as they burn out.  Set up your furniture so you have a clear path. Avoid moving your furniture around.  If any of your floors are uneven, fix them.  If there are any pets around you, be aware of where they are.  Review your medicines with your doctor. Some medicines can make you feel dizzy. This can increase your chance of falling. Ask your doctor what other things that you can do to help prevent falls.   This information is not intended to replace advice given to you by your health care provider. Make sure you discuss any questions you have with your health care provider.   Document Released: 07/01/2009 Document Revised: 01/19/2015 Document Reviewed: 10/09/2014 Elsevier Interactive Patient Education [employer].

## 2015-08-19 NOTE — Progress Notes (Signed)
Annual Wellness Visit as completed by Health Coach was reviewed in full.  

## 2015-08-19 NOTE — Progress Notes (Signed)
Subjective:   Diane Mullins is a 81 y.o. female who presents for Medicare Annual (Subsequent) preventive examination.  Review of Systems:  No ROS.  Medicare Wellness Visit.   Cardiac Risk Factors include: diabetes mellitus;advanced age (>55men, >22 women);hypertension     Objective:     Vitals: BP 138/72 mmHg  Pulse 82  Temp(Src) 97.4 F (36.3 C) (Oral)  Resp 14  Ht 5' 0.5" (1.537 m)  Wt 207 lb (93.895 kg)  BMI 39.75 kg/m2  SpO2 97%  LMP   Tobacco History  Smoking status  . Never Smoker   Smokeless tobacco  . Never Used     Counseling given: Not Answered   Past Medical History  Diagnosis Date  . Hyperlipidemia   . Hypertension   . Diabetes mellitus   . Thyroid disease   . H/O: rheumatic fever    Past Surgical History  Procedure Laterality Date  . Vaginal delivery      x4  . Abdominal hysterectomy  1973    menorrhagia  . Hernia repair  1994   Family History  Problem Relation Age of Onset  . Heart disease Mother 13  . Heart attack Mother 2  . Cancer Father     colon  . Diabetes Sister   . Hypertension Sister   . Cancer Brother     kidney  . Diabetes Brother    History  Sexual Activity  . Sexual Activity: No    Outpatient Encounter Prescriptions as of 08/19/2015  Medication Sig  . ACCU-CHEK AVIVA PLUS test strip PLEASE USE AT HOME TO CHECK BLOOD SUGARS TWICE A DAY  . Alcohol Swabs PADS Please use to cleanse skin before testing your blood sugars at home 1-2 times daily.  Marland Kitchen ALPRAZolam (XANAX) 0.25 MG tablet Take 1 tablet (0.25 mg total) by mouth 3 (three) times daily as needed for sleep or anxiety.  Marland Kitchen aspirin EC 81 MG tablet Take 81 mg by mouth daily.  . calcium-vitamin D (OSCAL) 250-125 MG-UNIT per tablet Take 1 tablet by mouth daily.  . fish oil-omega-3 fatty acids 1000 MG capsule Take 2 g by mouth daily.  . furosemide (LASIX) 20 MG tablet TAKE 1 TABLET EVERY DAY  . glimepiride (AMARYL) 4 MG tablet TAKE 1 TABLET EVERY MORNING  AND TAKE 1/2  TABLET EVERY EVENING  . Insulin Pen Needle (B-D UF III MINI PEN NEEDLES) 31G X 5 MM MISC Please use needles as directed once a day with your insulin pen. Dx code 250.00  . LANTUS SOLOSTAR 100 UNIT/ML Solostar Pen INJECT  15 UNITS SUBCUTANEOUSLY IN THE MORNING  . levothyroxine (SYNTHROID, LEVOTHROID) 75 MCG tablet TAKE 1 TABLET EVERY DAY  . losartan (COZAAR) 50 MG tablet Take 1 tablet (50 mg total) by mouth daily.  . metFORMIN (GLUCOPHAGE) 1000 MG tablet TAKE 1 TABLET TWICE DAILY  WITH  A  MEAL  . metoprolol succinate (TOPROL-XL) 100 MG 24 hr tablet Take 1 tablet (100 mg total) by mouth daily.  . Multiple Vitamins-Minerals (MULTIVITAMIN WITH MINERALS) tablet Take 1 tablet by mouth daily.  Marland Kitchen omeprazole (PRILOSEC) 20 MG capsule TAKE 1 CAPSULE EVERY DAY  . simvastatin (ZOCOR) 20 MG tablet TAKE 1 TABLET EVERY EVENING  . [DISCONTINUED] Insulin Glargine (LANTUS SOLOSTAR) 100 UNIT/ML Solostar Pen Inject 15 Units into the skin daily at 10 pm.  . [DISCONTINUED] omeprazole (PRILOSEC) 20 MG capsule TAKE 1 CAPSULE EVERY DAY   No facility-administered encounter medications on file as of 08/19/2015.    Activities  of Daily Living In your present state of health, do you have any difficulty performing the following activities: 08/19/2015  Hearing? Y  Vision? N  Difficulty concentrating or making decisions? N  Walking or climbing stairs? Y  Dressing or bathing? N  Doing errands, shopping? N  Preparing Food and eating ? N  Using the Toilet? N  In the past six months, have you accidently leaked urine? N  Do you have problems with loss of bowel control? N  Managing your Medications? N  Managing your Finances? N  Housekeeping or managing your Housekeeping? N    Patient Care Team: Jackolyn Confer, MD as PCP - General (Internal Medicine)    Assessment:   This is a routine wellness examination for Diane Mullins. The goal of the wellness visit is to assist the patient how to close the gaps in care and create a  preventative care plan for the patient.   Bone Density/Risk for Osteoporosis discussed/taking Calcium as appropriate.  Taking meds without issues; no barriers identified.   Safety issues reviewed; smoke detectors in the home. No firearms in the home. Wears seatbelts when driving or riding with others. No violence in the home.  The patient was oriented x 3; appropriate in dress and manner and no objective failures at ADL's or IADL's.   Labs ordered: Urine Microalbumin; written orders given to the patient.  IFOB; return as soon as possible.  Foot exam postponed for upcoming physical in January, per patient request.  Patient concerns: Age appropriate for COLONOSCOPY verses COLOGUARD.  Education provided.  Deferred to follow up with PCP.  Exercise Activities and Dietary recommendations Current Exercise Habits:: Home exercise routine (Chair/standing exercises), Time (Minutes): 20, Frequency (Times/Week): 3, Weekly Exercise (Minutes/Week): 60, Intensity: Mild  Goals    . Increase physical activity     Start doing balance classes at [facility] (for a 4 week class) 2 days a week for 2 hour sessions. Start water aerobics at the aquatic center when able.      Fall Risk Fall Risk  08/19/2015 09/07/2014 12/09/2012  Falls in the past year? Yes No No  Number falls in past yr: 2 or more - -  Risk Factor Category  High Fall Risk - -  Follow up Education provided;Falls prevention discussed - -   Depression Screen PHQ 2/9 Scores 08/19/2015 09/07/2014 12/09/2012 07/15/2012  PHQ - 2 Score 0 0 0 0     Cognitive Testing MMSE - Mini Mental State Exam 08/19/2015  Orientation to time 5  Orientation to Place 5  Registration 3  Attention/ Calculation 5  Recall 3  Language- name 2 objects 2  Language- repeat 1  Language- follow 3 step command 3  Language- read & follow direction 1  Write a sentence 1  Copy design 1  Total score 30    Immunization History  Administered Date(s) Administered  .  Influenza Split 09/19/2011, 05/27/2012  . Influenza,inj,Quad PF,36+ Mos 07/02/2013, 06/03/2014, 06/17/2015  . Pneumococcal Conjugate-13 12/01/2013  . Pneumococcal Polysaccharide-23 12/22/2011  . Tdap 06/19/2011   Screening Tests Health Maintenance  Topic Date Due  . URINE MICROALBUMIN  09/04/2014  . FOOT EXAM  06/04/2015  . HEMOGLOBIN A1C  09/04/2015  . OPHTHALMOLOGY EXAM  03/09/2016  . INFLUENZA VACCINE  04/18/2016  . TETANUS/TDAP  06/18/2021  . DEXA SCAN  Completed  . ZOSTAVAX  Addressed  . PNA vac Low Risk Adult  Completed      Plan:   End of life planning; Advance aging;  Advanced directives discussed. Copy requested of current Living Will.  Return in January for scheduled physical.  Return IFOB as soon as possible.  During the course of the visit the patient was educated and counseled about the following appropriate screening and preventive services:   Vaccines to include Pneumoccal, Influenza, Hepatitis B, Td, Zostavax, HCV  Electrocardiogram  Cardiovascular Disease  Colorectal cancer screening  Bone density screening  Diabetes screening  Glaucoma screening  Mammography/PAP  Nutrition counseling   Patient Instructions (the written plan) was given to the patient.   Varney Biles, LPN  [phone]

## 2015-09-15 ENCOUNTER — Other Ambulatory Visit: Payer: Self-pay | Admitting: Internal Medicine

## 2015-09-15 LAB — BASIC METABOLIC PANEL
BUN: 11 mg/dL (ref 4–21)
CREATININE: 0.8 mg/dL (ref 0.5–1.1)
Glucose: 130 mg/dL
POTASSIUM: 4.6 mmol/L (ref 3.4–5.3)
Sodium: 141 mmol/L (ref 137–147)

## 2015-09-15 LAB — HEMOGLOBIN A1C: HEMOGLOBIN A1C: 8.2

## 2015-09-15 LAB — LIPID PANEL
CHOLESTEROL: 177 mg/dL (ref 0–200)
HDL: 54 mg/dL (ref 35–70)
LDL Cholesterol: 88 mg/dL
TRIGLYCERIDES: 175 mg/dL — AB (ref 40–160)

## 2015-09-15 LAB — HEPATIC FUNCTION PANEL
ALK PHOS: 101 U/L (ref 25–125)
ALT: 17 U/L (ref 7–35)
AST: 17 U/L (ref 13–35)
BILIRUBIN, TOTAL: 0.2 mg/dL

## 2015-09-15 LAB — MICROALBUMIN, URINE: Microalb, Ur: 3.7

## 2015-09-16 LAB — COMPREHENSIVE METABOLIC PANEL
ALBUMIN: 4.1 g/dL (ref 3.5–4.7)
ALK PHOS: 101 IU/L (ref 39–117)
ALT: 17 IU/L (ref 0–32)
AST: 17 IU/L (ref 0–40)
Albumin/Globulin Ratio: 2 (ref 1.1–2.5)
BILIRUBIN TOTAL: 0.2 mg/dL (ref 0.0–1.2)
BUN / CREAT RATIO: 13 (ref 11–26)
BUN: 11 mg/dL (ref 8–27)
CHLORIDE: 100 mmol/L (ref 96–106)
CO2: 24 mmol/L (ref 18–29)
Calcium: 9.5 mg/dL (ref 8.7–10.3)
Creatinine, Ser: 0.85 mg/dL (ref 0.57–1.00)
GFR calc Af Amer: 75 mL/min/{1.73_m2} (ref >59)
GFR calc non Af Amer: 65 mL/min/{1.73_m2} (ref >59)
GLUCOSE: 130 mg/dL — AB (ref 65–99)
Globulin, Total: 2.1 g/dL (ref 1.5–4.5)
Potassium: 4.6 mmol/L (ref 3.5–5.2)
Sodium: 141 mmol/L (ref 134–144)
Total Protein: 6.2 g/dL (ref 6.0–8.5)

## 2015-09-16 LAB — LIPID PANEL W/O CHOL/HDL RATIO
CHOLESTEROL TOTAL: 177 mg/dL (ref 100–199)
HDL: 54 mg/dL (ref >39)
LDL Calculated: 88 mg/dL (ref 0–99)
Triglycerides: 175 mg/dL — ABNORMAL HIGH (ref 0–149)
VLDL CHOLESTEROL CAL: 35 mg/dL (ref 5–40)

## 2015-09-16 LAB — HGB A1C W/O EAG: Hgb A1c MFr Bld: 8.2 % — ABNORMAL HIGH (ref 4.8–5.6)

## 2015-09-16 LAB — MICROALBUMIN / CREATININE URINE RATIO
Creatinine, Urine: 39.9 mg/dL
MICROALB/CREAT RATIO: 9.3 mg/g{creat} (ref 0.0–30.0)
Microalbumin, Urine: 3.7 ug/mL

## 2015-09-18 ENCOUNTER — Observation Stay
Admission: EM | Admit: 2015-09-18 | Discharge: 2015-09-20 | Disposition: A | Discharge status: Home / Self Care | Payer: Commercial Managed Care - HMO | Attending: Internal Medicine | Admitting: Internal Medicine

## 2015-09-18 ENCOUNTER — Emergency Department: Payer: Commercial Managed Care - HMO

## 2015-09-18 ENCOUNTER — Encounter: Payer: Self-pay | Admitting: Emergency Medicine

## 2015-09-18 DIAGNOSIS — R51 Headache: Secondary | ICD-10-CM | POA: Diagnosis not present

## 2015-09-18 DIAGNOSIS — I739 Peripheral vascular disease, unspecified: Secondary | ICD-10-CM | POA: Diagnosis not present

## 2015-09-18 DIAGNOSIS — E785 Hyperlipidemia, unspecified: Secondary | ICD-10-CM | POA: Insufficient documentation

## 2015-09-18 DIAGNOSIS — E039 Hypothyroidism, unspecified: Secondary | ICD-10-CM | POA: Insufficient documentation

## 2015-09-18 DIAGNOSIS — R5381 Other malaise: Secondary | ICD-10-CM | POA: Diagnosis not present

## 2015-09-18 DIAGNOSIS — M25569 Pain in unspecified knee: Secondary | ICD-10-CM | POA: Diagnosis not present

## 2015-09-18 DIAGNOSIS — F419 Anxiety disorder, unspecified: Secondary | ICD-10-CM | POA: Insufficient documentation

## 2015-09-18 DIAGNOSIS — Z794 Long term (current) use of insulin: Secondary | ICD-10-CM | POA: Diagnosis not present

## 2015-09-18 DIAGNOSIS — E119 Type 2 diabetes mellitus without complications: Secondary | ICD-10-CM | POA: Insufficient documentation

## 2015-09-18 DIAGNOSIS — R531 Weakness: Secondary | ICD-10-CM | POA: Insufficient documentation

## 2015-09-18 DIAGNOSIS — R4781 Slurred speech: Secondary | ICD-10-CM | POA: Insufficient documentation

## 2015-09-18 DIAGNOSIS — I10 Essential (primary) hypertension: Secondary | ICD-10-CM | POA: Insufficient documentation

## 2015-09-18 DIAGNOSIS — R202 Paresthesia of skin: Secondary | ICD-10-CM | POA: Diagnosis present

## 2015-09-18 DIAGNOSIS — Z7901 Long term (current) use of anticoagulants: Secondary | ICD-10-CM | POA: Insufficient documentation

## 2015-09-18 DIAGNOSIS — I639 Cerebral infarction, unspecified: Secondary | ICD-10-CM

## 2015-09-18 DIAGNOSIS — R209 Unspecified disturbances of skin sensation: Secondary | ICD-10-CM | POA: Insufficient documentation

## 2015-09-18 DIAGNOSIS — Z7982 Long term (current) use of aspirin: Secondary | ICD-10-CM | POA: Insufficient documentation

## 2015-09-18 DIAGNOSIS — R296 Repeated falls: Secondary | ICD-10-CM | POA: Diagnosis not present

## 2015-09-18 DIAGNOSIS — R200 Anesthesia of skin: Secondary | ICD-10-CM | POA: Insufficient documentation

## 2015-09-18 DIAGNOSIS — G459 Transient cerebral ischemic attack, unspecified: Principal | ICD-10-CM | POA: Diagnosis present

## 2015-09-18 LAB — LIPID PANEL
CHOLESTEROL: 184 mg/dL (ref 0–200)
HDL: 54 mg/dL (ref >40)
LDL Cholesterol: 103 mg/dL — ABNORMAL HIGH (ref 0–99)
TRIGLYCERIDES: 135 mg/dL (ref <150)
Total CHOL/HDL Ratio: 3.4 RATIO
VLDL: 27 mg/dL (ref 0–40)

## 2015-09-18 LAB — CBC
HCT: 38.6 % (ref 35.0–47.0)
HEMOGLOBIN: 12.5 g/dL (ref 12.0–16.0)
MCH: 28.1 pg (ref 26.0–34.0)
MCHC: 32.5 g/dL (ref 32.0–36.0)
MCV: 86.5 fL (ref 80.0–100.0)
Platelets: 257 10*3/uL (ref 150–440)
RBC: 4.46 MIL/uL (ref 3.80–5.20)
RDW: 15.2 % — ABNORMAL HIGH (ref 11.5–14.5)
WBC: 9.4 10*3/uL (ref 3.6–11.0)

## 2015-09-18 LAB — DIFFERENTIAL
Basophils Absolute: 0.1 10*3/uL (ref 0–0.1)
Basophils Relative: 1 %
EOS PCT: 3 %
Eosinophils Absolute: 0.3 10*3/uL (ref 0–0.7)
LYMPHS ABS: 3.2 10*3/uL (ref 1.0–3.6)
LYMPHS PCT: 34 %
Monocytes Absolute: 1 10*3/uL — ABNORMAL HIGH (ref 0.2–0.9)
Monocytes Relative: 11 %
NEUTROS ABS: 4.9 10*3/uL (ref 1.4–6.5)
NEUTROS PCT: 51 %

## 2015-09-18 LAB — COMPREHENSIVE METABOLIC PANEL
ALBUMIN: 3.9 g/dL (ref 3.5–5.0)
ALK PHOS: 99 U/L (ref 38–126)
ALT: 20 U/L (ref 14–54)
ANION GAP: 9 (ref 5–15)
AST: 18 U/L (ref 15–41)
BILIRUBIN TOTAL: 0.6 mg/dL (ref 0.3–1.2)
BUN: 14 mg/dL (ref 6–20)
CALCIUM: 9.5 mg/dL (ref 8.9–10.3)
CO2: 29 mmol/L (ref 22–32)
CREATININE: 0.84 mg/dL (ref 0.44–1.00)
Chloride: 103 mmol/L (ref 101–111)
GFR calc Af Amer: >60 mL/min (ref >60)
GFR calc non Af Amer: >60 mL/min (ref >60)
GLUCOSE: 197 mg/dL — AB (ref 65–99)
Potassium: 4 mmol/L (ref 3.5–5.1)
Sodium: 141 mmol/L (ref 135–145)
TOTAL PROTEIN: 7 g/dL (ref 6.5–8.1)

## 2015-09-18 LAB — TROPONIN I: Troponin I: <0.03 ng/mL (ref <0.031)

## 2015-09-18 LAB — GLUCOSE, CAPILLARY
GLUCOSE-CAPILLARY: 167 mg/dL — AB (ref 65–99)
Glucose-Capillary: 139 mg/dL — ABNORMAL HIGH (ref 65–99)
Glucose-Capillary: 241 mg/dL — ABNORMAL HIGH (ref 65–99)

## 2015-09-18 LAB — APTT: aPTT: 24 seconds (ref 24–36)

## 2015-09-18 LAB — PROTIME-INR
INR: 0.92
Prothrombin Time: 12.6 seconds (ref 11.4–15.0)

## 2015-09-18 MED ORDER — LORAZEPAM 2 MG/ML IJ SOLN
1.0000 mg | Freq: Once | INTRAMUSCULAR | Status: AC | PRN
  Administered 2015-09-20: 1 mg via INTRAVENOUS
  Filled 2015-09-18: qty 1

## 2015-09-18 MED ORDER — SIMVASTATIN 20 MG PO TABS
20.0000 mg | ORAL_TABLET | Freq: Every evening | ORAL | Status: DC
  Administered 2015-09-18 – 2015-09-20 (×3): 20 mg via ORAL
  Filled 2015-09-18 (×3): qty 1

## 2015-09-18 MED ORDER — METOPROLOL SUCCINATE ER 50 MG PO TB24
100.0000 mg | ORAL_TABLET | Freq: Every day | ORAL | Status: DC
  Administered 2015-09-19 – 2015-09-20 (×2): 100 mg via ORAL
  Filled 2015-09-18 (×2): qty 2

## 2015-09-18 MED ORDER — ENOXAPARIN SODIUM 40 MG/0.4ML ~~LOC~~ SOLN
40.0000 mg | SUBCUTANEOUS | Status: DC
  Administered 2015-09-18 – 2015-09-20 (×3): 40 mg via SUBCUTANEOUS
  Filled 2015-09-18 (×3): qty 0.4

## 2015-09-18 MED ORDER — ACETAMINOPHEN 325 MG PO TABS
650.0000 mg | ORAL_TABLET | Freq: Four times a day (QID) | ORAL | Status: DC | PRN
  Administered 2015-09-18: 17:00:00 650 mg via ORAL
  Filled 2015-09-18: qty 2

## 2015-09-18 MED ORDER — GLIMEPIRIDE 2 MG PO TABS
4.0000 mg | ORAL_TABLET | Freq: Every day | ORAL | Status: DC
  Administered 2015-09-19 – 2015-09-20 (×2): 4 mg via ORAL
  Filled 2015-09-18 (×2): qty 2

## 2015-09-18 MED ORDER — SODIUM CHLORIDE 0.9 % IJ SOLN
3.0000 mL | Freq: Two times a day (BID) | INTRAMUSCULAR | Status: DC
  Administered 2015-09-18 – 2015-09-20 (×4): 3 mL via INTRAVENOUS

## 2015-09-18 MED ORDER — INSULIN GLARGINE 100 UNIT/ML ~~LOC~~ SOLN
15.0000 [IU] | Freq: Every day | SUBCUTANEOUS | Status: DC
  Filled 2015-09-18 (×2): qty 0.15

## 2015-09-18 MED ORDER — ONDANSETRON HCL 4 MG PO TABS: 4.0000 mg | ORAL_TABLET | Freq: Four times a day (QID) | ORAL | Status: DC | PRN

## 2015-09-18 MED ORDER — OMEGA-3-ACID ETHYL ESTERS 1 G PO CAPS
1.0000 g | ORAL_CAPSULE | Freq: Two times a day (BID) | ORAL | Status: DC
  Administered 2015-09-18 – 2015-09-20 (×4): 1 g via ORAL
  Filled 2015-09-18 (×6): qty 1

## 2015-09-18 MED ORDER — PANTOPRAZOLE SODIUM 40 MG PO TBEC
40.0000 mg | DELAYED_RELEASE_TABLET | Freq: Every day | ORAL | Status: DC
  Administered 2015-09-18 – 2015-09-20 (×3): 40 mg via ORAL
  Filled 2015-09-18 (×3): qty 1

## 2015-09-18 MED ORDER — CALCIUM CARBONATE-VITAMIN D 500-200 MG-UNIT PO TABS
1.0000 | ORAL_TABLET | Freq: Every day | ORAL | Status: DC
  Administered 2015-09-18 – 2015-09-20 (×3): 1 via ORAL
  Filled 2015-09-18 (×4): qty 1

## 2015-09-18 MED ORDER — VITAMIN D 1000 UNITS PO TABS
1000.0000 [IU] | ORAL_TABLET | Freq: Every day | ORAL | Status: DC
  Administered 2015-09-18 – 2015-09-20 (×3): 1000 [IU] via ORAL
  Filled 2015-09-18 (×5): qty 1

## 2015-09-18 MED ORDER — ALPRAZOLAM 0.25 MG PO TABS
0.2500 mg | ORAL_TABLET | Freq: Three times a day (TID) | ORAL | Status: DC | PRN
  Administered 2015-09-18 – 2015-09-19 (×2): 0.25 mg via ORAL
  Filled 2015-09-18 (×2): qty 1

## 2015-09-18 MED ORDER — METFORMIN HCL 500 MG PO TABS
1000.0000 mg | ORAL_TABLET | Freq: Two times a day (BID) | ORAL | Status: DC
  Administered 2015-09-18 – 2015-09-20 (×5): 1000 mg via ORAL
  Filled 2015-09-18 (×5): qty 2

## 2015-09-18 MED ORDER — ADULT MULTIVITAMIN W/MINERALS CH
1.0000 | ORAL_TABLET | Freq: Every day | ORAL | Status: DC
  Administered 2015-09-18 – 2015-09-20 (×3): 1 via ORAL
  Filled 2015-09-18 (×4): qty 1

## 2015-09-18 MED ORDER — ONDANSETRON HCL 4 MG/2ML IJ SOLN: 4.0000 mg | Freq: Four times a day (QID) | INTRAMUSCULAR | Status: DC | PRN

## 2015-09-18 MED ORDER — ACETAMINOPHEN 650 MG RE SUPP: 650.0000 mg | Freq: Four times a day (QID) | RECTAL | Status: DC | PRN

## 2015-09-18 MED ORDER — ASPIRIN EC 81 MG PO TBEC
81.0000 mg | DELAYED_RELEASE_TABLET | Freq: Every day | ORAL | Status: DC
  Administered 2015-09-19: 08:00:00 81 mg via ORAL
  Filled 2015-09-18: qty 1

## 2015-09-18 MED ORDER — INSULIN ASPART 100 UNIT/ML ~~LOC~~ SOLN
0.0000 [IU] | Freq: Every day | SUBCUTANEOUS | Status: DC
  Administered 2015-09-18: 2 [IU] via SUBCUTANEOUS
  Filled 2015-09-18: qty 2

## 2015-09-18 MED ORDER — LOSARTAN POTASSIUM 50 MG PO TABS
50.0000 mg | ORAL_TABLET | Freq: Every day | ORAL | Status: DC
  Administered 2015-09-19 – 2015-09-20 (×2): 50 mg via ORAL
  Filled 2015-09-18 (×2): qty 1

## 2015-09-18 MED ORDER — LEVOTHYROXINE SODIUM 75 MCG PO TABS
75.0000 ug | ORAL_TABLET | Freq: Every day | ORAL | Status: DC
Start: 2015-09-18 — End: 2015-09-20
  Administered 2015-09-19 – 2015-09-20 (×2): 75 ug via ORAL
  Filled 2015-09-18 (×3): qty 1

## 2015-09-18 MED ORDER — INSULIN GLARGINE 100 UNIT/ML ~~LOC~~ SOLN
15.0000 [IU] | Freq: Every day | SUBCUTANEOUS | Status: DC
  Administered 2015-09-19 – 2015-09-20 (×2): 15 [IU] via SUBCUTANEOUS
  Filled 2015-09-18 (×3): qty 0.15

## 2015-09-18 MED ORDER — INSULIN ASPART 100 UNIT/ML ~~LOC~~ SOLN
0.0000 [IU] | Freq: Three times a day (TID) | SUBCUTANEOUS | Status: DC
  Administered 2015-09-18: 1 [IU] via SUBCUTANEOUS
  Administered 2015-09-19 (×2): 2 [IU] via SUBCUTANEOUS
  Administered 2015-09-20 (×2): 1 [IU] via SUBCUTANEOUS
  Administered 2015-09-20: 12:00:00 2 [IU] via SUBCUTANEOUS
  Filled 2015-09-18: qty 4
  Filled 2015-09-18 (×3): qty 1
  Filled 2015-09-18 (×2): qty 2

## 2015-09-18 NOTE — ED Notes (Signed)
Returned from CT.

## 2015-09-18 NOTE — ED Notes (Signed)
Pt reports that around 0930 she awoke with numbness in right arm.  Had difficulty speaking.  Has right leg numbness. EMS evaluated pt around 11am.

## 2015-09-18 NOTE — H&P (Signed)
[facility] [place] NAME: Diane Mullins    MR#:  [id number]  DATE OF BIRTH:  1933/02/10  DATE OF ADMISSION:  09/18/2015  PRIMARY CARE PHYSICIAN: Rica Mast, MD   REQUESTING/REFERRING PHYSICIAN: Dr. Francene Castle  CHIEF COMPLAINT:   Chief Complaint  Patient presents with  . Stroke Symptoms    HISTORY OF PRESENT ILLNESS:  Diane Mullins  is a 81 y.o. female with a known history of hypertension, insulin-dependent diabetes mellitus, hypothyroidism and hyperlipidemia presents to the hospital secondary to sudden onset of right hand numbness and also right-sided numbness associated with slurred speech that started this morning. Patient says she woke up and realized that her right arm was completely numb and heavy and she couldn't move it. She is left-handed. She didn't know if her left leg also felt the same way as she will uses a walker at baseline to ambulate and sometimes has to drag her feet. Sided headache and she realized that her speech was thick and slurred when she called her daughter. She was also having blurred vision and trouble looking at the numbers at the time. According to son-in-law her speech was not clear and she was having trouble finding the right words. Symptoms have resolved within 2-3 hours by the time she presented to the emergency room. Her speech is more clear but she still has some right hand numbness. She has been having right shoulder issues since her recent fall and rotator cuff injury that she is not able to lift the arm about the shoulder due to pain and weakness. But her hand grip and proximal and distal muscle strength are back to normal at this time. CT of the head is negative for any acute findings. She is being admitted for possible TIA.  PAST MEDICAL HISTORY:   Past Medical History  Diagnosis Date  . Hyperlipidemia   . Hypertension   . Diabetes mellitus   . Thyroid disease   . H/O: rheumatic  fever     PAST SURGICAL HISTORY:   Past Surgical History  Procedure Laterality Date  . Vaginal delivery      x4  . Abdominal hysterectomy  1973    menorrhagia  . Hernia repair  1994    SOCIAL HISTORY:   Social History  Substance Use Topics  . Smoking status: Never Smoker   . Smokeless tobacco: Never Used  . Alcohol Use: No    FAMILY HISTORY:   Family History  Problem Relation Age of Onset  . Heart disease Mother 50  . Heart attack Mother 33  . Cancer Father     colon  . Diabetes Sister   . Hypertension Sister   . Cancer Brother     kidney  . Diabetes Brother     DRUG ALLERGIES:  No Known Allergies  REVIEW OF SYSTEMS:   Review of Systems  Constitutional: Negative for fever, chills, weight loss and malaise/fatigue.  HENT: Negative for ear discharge, ear pain, hearing loss, nosebleeds and tinnitus.   Eyes: Positive for blurred vision. Negative for double vision and photophobia.  Respiratory: Negative for cough, hemoptysis, shortness of breath and wheezing.   Cardiovascular: Negative for chest pain, palpitations, orthopnea and leg swelling.  Gastrointestinal: Negative for heartburn, nausea, vomiting, abdominal pain, diarrhea, constipation and melena.  Genitourinary: Negative for dysuria, urgency, frequency and hematuria.  Musculoskeletal: Positive for joint pain. Negative for myalgias, back pain and neck pain.       Right shoulder  pain  Skin: Negative for rash.  Neurological: Positive for speech change and focal weakness. Negative for dizziness, tremors, sensory change and headaches.  Endo/Heme/Allergies: Does not bruise/bleed easily.  Psychiatric/Behavioral: Negative for depression.    MEDICATIONS AT HOME:   Prior to Admission medications   Medication Sig Start Date End Date Taking? Authorizing Provider  ACCU-CHEK AVIVA PLUS test strip PLEASE USE AT HOME TO CHECK BLOOD SUGARS TWICE A DAY 03/09/15   Jackolyn Confer, MD  Alcohol Swabs PADS Please use to  cleanse skin before testing your blood sugars at home 1-2 times daily. 04/22/13   Jackolyn Confer, MD  ALPRAZolam Duanne Moron) 0.25 MG tablet Take 1 tablet (0.25 mg total) by mouth 3 (three) times daily as needed for sleep or anxiety. 06/17/15   Jackolyn Confer, MD  aspirin EC 81 MG tablet Take 81 mg by mouth daily.    Historical Provider, MD  calcium-vitamin D (OSCAL) 250-125 MG-UNIT per tablet Take 1 tablet by mouth daily.    Historical Provider, MD  fish oil-omega-3 fatty acids 1000 MG capsule Take 2 g by mouth daily.    Historical Provider, MD  furosemide (LASIX) 20 MG tablet TAKE 1 TABLET EVERY DAY 03/09/15   Jackolyn Confer, MD  glimepiride (AMARYL) 4 MG tablet TAKE 1 TABLET EVERY MORNING  AND TAKE 1/2 TABLET EVERY EVENING 03/09/15   Jackolyn Confer, MD  Insulin Pen Needle (B-D UF III MINI PEN NEEDLES) 31G X 5 MM MISC Please use needles as directed once a day with your insulin pen. Dx code 250.00 05/27/13   Jackolyn Confer, MD  LANTUS SOLOSTAR 100 UNIT/ML Solostar Pen INJECT  15 UNITS SUBCUTANEOUSLY IN THE MORNING 08/19/15   Crecencio Mc, MD  levothyroxine (SYNTHROID, LEVOTHROID) 75 MCG tablet TAKE 1 TABLET EVERY DAY 03/09/15   Jackolyn Confer, MD  losartan (COZAAR) 50 MG tablet Take 1 tablet (50 mg total) by mouth daily. 08/03/15   Jackolyn Confer, MD  metFORMIN (GLUCOPHAGE) 1000 MG tablet TAKE 1 TABLET TWICE DAILY  WITH  A  MEAL 03/09/15   Jackolyn Confer, MD  metoprolol succinate (TOPROL-XL) 100 MG 24 hr tablet Take 1 tablet (100 mg total) by mouth daily. 12/07/14   Jackolyn Confer, MD  Multiple Vitamins-Minerals (MULTIVITAMIN WITH MINERALS) tablet Take 1 tablet by mouth daily.    Historical Provider, MD  omeprazole (PRILOSEC) 20 MG capsule TAKE 1 CAPSULE EVERY DAY 08/19/15   Crecencio Mc, MD  simvastatin (ZOCOR) 20 MG tablet TAKE 1 TABLET EVERY EVENING 03/09/15   Jackolyn Confer, MD      VITAL SIGNS:  Blood pressure 137/67, pulse 85, temperature 98.3 F (36.8 C), temperature  source Oral, resp. rate 20, height 5\' 1"  (1.549 m), weight 93.895 kg (207 lb), SpO2 96 %.  PHYSICAL EXAMINATION:   Physical Exam  GENERAL:  81 y.o.-year-old patient lying in the bed with no acute distress.  EYES: Pupils equal, round, reactive to light and accommodation. No scleral icterus. Extraocular muscles intact.  HEENT: Head atraumatic, normocephalic. Oropharynx and nasopharynx clear.  NECK:  Supple, no jugular venous distention. No thyroid enlargement, no tenderness.  LUNGS: Normal breath sounds bilaterally, no wheezing, rales,rhonchi or crepitation. No use of accessory muscles of respiration.  CARDIOVASCULAR: S1, S2 normal. No murmurs, rubs, or gallops.  ABDOMEN: Soft, nontender, nondistended. Bowel sounds present. No organomegaly or mass.  EXTREMITIES: No pedal edema, cyanosis, or clubbing.  NEUROLOGIC: Cranial nerves II through XII are intact. Muscle strength  5/5 in all extremities. Limited Motion of the right arm at the shoulder secondary to recent fall and rotator cuff injury. Sensation intact. Gait not checked. Speech is clear now. PSYCHIATRIC: The patient is alert and oriented x 3.  SKIN: No obvious rash, lesion, or ulcer.   LABORATORY PANEL:   CBC  Recent Labs Lab 09/18/15 1316  WBC 9.4  HGB 12.5  HCT 38.6  PLT 257   ------------------------------------------------------------------------------------------------------------------  Chemistries   Recent Labs Lab 09/18/15 1316  NA 141  K 4.0  CL 103  CO2 29  GLUCOSE 197*  BUN 14  CREATININE 0.84  CALCIUM 9.5  AST 18  ALT 20  ALKPHOS 99  BILITOT 0.6   ------------------------------------------------------------------------------------------------------------------  Cardiac Enzymes  Recent Labs Lab 09/18/15 1316  TROPONINI <0.03   ------------------------------------------------------------------------------------------------------------------  RADIOLOGY:  Ct Head Wo Contrast  09/18/2015   CLINICAL DATA:  Right arm numbness.  Difficulty speaking. EXAM: CT HEAD WITHOUT CONTRAST TECHNIQUE: Contiguous axial images were obtained from the base of the skull through the vertex without intravenous contrast. COMPARISON:  MRI of 11/10/2005 FINDINGS: Sinuses/Soft tissues: Clear paranasal sinuses and mastoid air cells. Intracranial: Mild for age low density in the periventricular white matter likely related to small vessel disease. No mass lesion, hemorrhage, hydrocephalus, acute infarct, intra-axial, or extra-axial fluid collection. IMPRESSION: 1.  No acute intracranial abnormality. 2. Mild for age small vessel ischemic change. Electronically Signed   By: Abigail Miyamoto M.D.   On: 09/18/2015 13:55    EKG:   Orders placed or performed during the hospital encounter of 09/18/15  . ED EKG  . ED EKG  . EKG 12-Lead  . EKG 12-Lead  . EKG 12-Lead  . EKG 12-Lead    IMPRESSION AND PLAN:   Diane Mullins  is a 81 y.o. female with a known history of hypertension,insulin-dependent diabetes mellitus, hypothyroidism and hyperlipidemia presents to the hospital secondary to sudden onset of right hand numbness and also right-sided numbness associated with slurred speech that started this morning.  #1 TIA-admit to floor, under observation. Telemetry. Neuro checks. -MRI and MRA brain - carotid dopplers and ECHO - PT consult with recurrent falls lately - lipid panel. Cont asa and statin for now  #2 hypertension- continue losartan and metoprolol  #3 diabetes mellitus-on Lantus, Amaryl and metformin. On sliding scale insulin as well  #4 hypothyroidism-continue Synthroid  #5 DVT prophylaxis-on Lovenox    All the records are reviewed and case discussed with ED provider. Management plans discussed with the patient, family and they are in agreement.  CODE STATUS: Full Code  TOTAL TIME TAKING CARE OF THIS PATIENT: 50 minutes.    Gladstone Lighter M.D on 09/18/2015 at 3:16 PM  Between 7am to 6pm -  Pager - [phone]  After 6pm go to [email or web address] - password [email or web address]  Office  [phone]  CC: Primary care physician; Rica Mast, MD

## 2015-09-18 NOTE — ED Provider Notes (Signed)
[facility] Emergency Department Provider Note  ____________________________________________  Time seen: 1:40 PM  I have reviewed the triage vital signs and the nursing notes.  History by:  Primarily from patient, but family also present.  HISTORY  Chief Complaint Stroke Symptoms  speech difficulties and paresthesia on the right this morning.    HPI Diane Mullins is a 81 y.o. female who lives alone. She will quit 8:15 feeling well. She got up and did her usual routine. She spoke with her daughter who reports she was speaking fairly normally and the patient was not having any difficulties. Later in the morning, approximately 9:30 or so, the patient reports she started to not feel well. She reports she had some difficulty speaking when she called her daughter to tell her she was not feeling well. She was able to enunciate that "I think I'm having a stroke" to her daughter on the phone. But the daughter did say she had some slurred speech.  In addition to speech difficulties, the patient reported she had numbness on the right side.  Patient denies any headache. She denies any nausea, chest pain, or abdominal pain.  She does report that she has been having more falls over the past few months. She had a fall the beginning of November and was seen in the emergency department.  At this hour, the patient feels better than she did earlier. She is able to speak and she reports the numbness on her right side has improved.   Past Medical History  Diagnosis Date  . Hyperlipidemia   . Hypertension   . Diabetes mellitus   . Thyroid disease   . H/O: rheumatic fever     Patient Active Problem List   Diagnosis Date Noted  . Orthostatic hypotension 08/02/2015  . Right shoulder injury 08/02/2015  . Chronic fatigue 07/16/2015  . Adjustment disorder with anxious mood 03/30/2015  . Benign paroxysmal positional vertigo 07/17/2014  . Postmenopausal estrogen deficiency  06/03/2014  . DNR (do not resuscitate) discussion 03/04/2014  . Gait disturbance 05/07/2013  . Type 2 diabetes mellitus, uncontrolled (Mariano Colon) 04/21/2013  . Tachycardia 01/23/2013  . Medicare annual wellness visit, subsequent 12/09/2012  . Exertional dyspnea 12/09/2012  . Hearing loss 12/09/2012  . Anxiety 01/31/2012  . Hypertension 12/22/2011  . Hyperlipidemia 12/22/2011  . Hypothyroidism 12/22/2011    Past Surgical History  Procedure Laterality Date  . Vaginal delivery      x4  . Abdominal hysterectomy  1973    menorrhagia  . Hernia repair  1994    Current Outpatient Rx  Name  Route  Sig  Dispense  Refill  . ACCU-CHEK AVIVA PLUS test strip      PLEASE USE AT HOME TO CHECK BLOOD SUGARS TWICE A DAY   200 each   12   . Alcohol Swabs PADS      Please use to cleanse skin before testing your blood sugars at home 1-2 times daily.   100 each   3   . ALPRAZolam (XANAX) 0.25 MG tablet   Oral   Take 1 tablet (0.25 mg total) by mouth 3 (three) times daily as needed for sleep or anxiety.   90 tablet   3   . aspirin EC 81 MG tablet   Oral   Take 81 mg by mouth daily.         . calcium-vitamin D (OSCAL) 250-125 MG-UNIT per tablet   Oral   Take 1 tablet by mouth daily.         Marland Kitchen  fish oil-omega-3 fatty acids 1000 MG capsule   Oral   Take 2 g by mouth daily.         . furosemide (LASIX) 20 MG tablet      TAKE 1 TABLET EVERY DAY   90 tablet   1   . glimepiride (AMARYL) 4 MG tablet      TAKE 1 TABLET EVERY MORNING  AND TAKE 1/2 TABLET EVERY EVENING   135 tablet   1   . Insulin Pen Needle (B-D UF III MINI PEN NEEDLES) 31G X 5 MM MISC      Please use needles as directed once a day with your insulin pen. Dx code 250.00   100 each   5   . LANTUS SOLOSTAR 100 UNIT/ML Solostar Pen      INJECT  15 UNITS SUBCUTANEOUSLY IN THE MORNING   15 mL   6   . levothyroxine (SYNTHROID, LEVOTHROID) 75 MCG tablet      TAKE 1 TABLET EVERY DAY   90 tablet   1   .  losartan (COZAAR) 50 MG tablet   Oral   Take 1 tablet (50 mg total) by mouth daily.   90 tablet   0   . metFORMIN (GLUCOPHAGE) 1000 MG tablet      TAKE 1 TABLET TWICE DAILY  WITH  A  MEAL   180 tablet   1   . metoprolol succinate (TOPROL-XL) 100 MG 24 hr tablet   Oral   Take 1 tablet (100 mg total) by mouth daily.   90 tablet   3   . Multiple Vitamins-Minerals (MULTIVITAMIN WITH MINERALS) tablet   Oral   Take 1 tablet by mouth daily.         Marland Kitchen omeprazole (PRILOSEC) 20 MG capsule      TAKE 1 CAPSULE EVERY DAY   90 capsule   1   . simvastatin (ZOCOR) 20 MG tablet      TAKE 1 TABLET EVERY EVENING   90 tablet   1     Allergies Review of patient's allergies indicates no known allergies.  Family History  Problem Relation Age of Onset  . Heart disease Mother 56  . Heart attack Mother 28  . Cancer Father     colon  . Diabetes Sister   . Hypertension Sister   . Cancer Brother     kidney  . Diabetes Brother     Social History Social History  Substance Use Topics  . Smoking status: Never Smoker   . Smokeless tobacco: Never Used  . Alcohol Use: No    Review of Systems  Constitutional: Negative for fever/chills. ENT: Negative for congestion. Cardiovascular: Negative for chest pain. Respiratory: Negative for cough. Gastrointestinal: Negative for abdominal pain, vomiting and diarrhea. Genitourinary: Negative for dysuria. Musculoskeletal: No back pain. Skin: Negative for rash. Neurological: Difficult to his speech, slurred speech, paresthesias, earlier today. See history of present illness   10-point ROS otherwise negative.  ____________________________________________   PHYSICAL EXAM:  VITAL SIGNS: ED Triage Vitals  Enc Vitals Group     BP 09/18/15 1303 180/84 mmHg     Pulse Rate 09/18/15 1303 94     Resp 09/18/15 1303 18     Temp 09/18/15 1303 98.3 F (36.8 C)     Temp Source 09/18/15 1303 Oral     SpO2 09/18/15 1303 94 %     Weight  09/18/15 1303 207 lb (93.895 kg)     Height 09/18/15 1303  5\' 1"  (1.549 m)     Head Cir --      Peak Flow --      Pain Score 09/18/15 1306 7     Pain Loc --      Pain Edu? --      Excl. in [place]? --     Constitutional:  Alert and oriented. Well appearing and in no distress. ENT   Head: Normocephalic and atraumatic.   Nose: No congestion/rhinnorhea.       Mouth: No erythema, no swelling   Cardiovascular: Normal rate, regular rhythm, no murmur noted Respiratory:  Normal respiratory effort, no tachypnea.    Breath sounds are clear and equal bilaterally.  Gastrointestinal: Soft, no distention. Nontender Back: No muscle spasm, no tenderness, no CVA tenderness. Musculoskeletal: No deformity noted. Nontender with normal range of motion in all extremities.  No noted edema. Neurologic:  Communicative. Possibly mild diminished grip strength in the right hand. 55 strength in all 4 extremities. Sensation intact throughout, though patient reports mild diminished sensation in the right forearm only. Cranial nerves II through XII are intact. Skin:  Skin is warm, dry. No rash noted. Psychiatric: Mood and affect are normal. Speech and behavior are normal.  ____________________________________________    LABS (pertinent positives/negatives)  Labs Reviewed  CBC - Abnormal; Notable for the following:    RDW 15.2 (*)    All other components within normal limits  DIFFERENTIAL - Abnormal; Notable for the following:    Monocytes Absolute 1.0 (*)    All other components within normal limits  COMPREHENSIVE METABOLIC PANEL - Abnormal; Notable for the following:    Glucose, Bld 197 (*)    All other components within normal limits  GLUCOSE, CAPILLARY - Abnormal; Notable for the following:    Glucose-Capillary 167 (*)    All other components within normal limits  PROTIME-INR  APTT  TROPONIN I  CBG MONITORING, ED     ____________________________________________   EKG  ED ECG REPORT I,  Trejan Buda W, the attending physician, personally viewed and interpreted this ECG.   Date: 09/18/2015  EKG Time: 1318  Rate: 96  Rhythm: sinus rhythm  Axis: -19  Intervals: Normal  ST&T Change: None noted   ____________________________________________    RADIOLOGY  CT scan head: FINDINGS: Sinuses/Soft tissues: Clear paranasal sinuses and mastoid air cells.  Intracranial: Mild for age low density in the periventricular white matter likely related to small vessel disease. No mass lesion, hemorrhage, hydrocephalus, acute infarct, intra-axial, or extra-axial fluid collection.  IMPRESSION: 1. No acute intracranial abnormality. 2. Mild for age small vessel ischemic change.  ____________________________________________   PROCEDURES  CRITICAL CARE Performed by: Ahmed Prima   Total critical care time: 30 minutes due to the patient's acute CVA/TIA, discussion with patient and family, consultation.  Critical care time was exclusive of separately billable procedures and treating other patients.  Critical care was necessary to treat or prevent imminent or life-threatening deterioration.  Critical care was time spent personally by me on the following activities: development of treatment plan with patient and/or surrogate as well as nursing, discussions with consultants, evaluation of patient's response to treatment, examination of patient, obtaining history from patient or surrogate, ordering and performing treatments and interventions, ordering and review of laboratory studies, ordering and review of radiographic studies, pulse oximetry and re-evaluation of patient's condition.   ____________________________________________   INITIAL IMPRESSION / ASSESSMENT AND PLAN / ED COURSE  Pertinent labs & imaging results that were available during my care of the  patient were reviewed by me and considered in my medical decision making (see chart for details).  81 year old  female with signs and symptoms of a CVA earlier today. The symptoms seem to have mostly resolved. She continues to have some degree of paresthesia in the right arm. The episode was worrisome for CVA. CT scan has been performed with results pending. Her NIH score is currently 1.  Presuming a nonhemorrhagic CT, we will seek treatment the patient to the hospital for further evaluation of this TIA/CVA.  ----------------------------------------- 2:50 PM on 09/18/2015 -----------------------------------------  Patient continues to appear stable, alert, communicative. Her head CT is negative for hemorrhage or other acute findings. We will consult the hospitalist service for admission the hospital due to this CVA with mild lingering symptoms, possible TIA. I discussed case with Dr. Marquis Lunch.  ____________________________________________   FINAL CLINICAL IMPRESSION(S) / ED DIAGNOSES  Final diagnoses:  Cerebral infarction due to unspecified mechanism  Paresthesia of right arm      Ahmed Prima, MD 09/18/15 1451

## 2015-09-18 NOTE — ED Notes (Signed)
Spoke with Henrene Pastor at [facility], information given and will contact neuro.

## 2015-09-18 NOTE — ED Notes (Addendum)
Urine sample collected in case MD orders UA. Sent to lab to hold.

## 2015-09-18 NOTE — Care Management Obs Status (Signed)
[place] NOTIFICATION   Patient Details  Name: Diane Mullins MRN: [id number] Date of Birth: 03/16/1933   Medicare Observation Status Notification Given:  Yes    Ival Bible, RN 09/18/2015, 6:58 PM

## 2015-09-18 NOTE — ED Notes (Addendum)
Pt reports increased numbness and aphasia this morning when trying to call family.  Pt lives alone.  Family states at 33 patient's speech was slurred and had difficulty getting words out. C/o numbness on right side and dull headache as well family stated around 61.  Pt states she took 2 81mg  ASA today. Pt also reports she fell at a restaurant in November and was evaluated at ED then.  Family states she has been falling a lot and is using a walker at home.

## 2015-09-18 NOTE — ED Notes (Signed)
Patient transported to CT 

## 2015-09-19 ENCOUNTER — Observation Stay (HOSPITAL_BASED_OUTPATIENT_CLINIC_OR_DEPARTMENT_OTHER)
Admit: 2015-09-19 | Discharge: 2015-09-19 | Disposition: A | Discharge status: Home / Self Care | Payer: Commercial Managed Care - HMO | Attending: Internal Medicine | Admitting: Internal Medicine

## 2015-09-19 ENCOUNTER — Observation Stay: Payer: Commercial Managed Care - HMO

## 2015-09-19 DIAGNOSIS — G459 Transient cerebral ischemic attack, unspecified: Secondary | ICD-10-CM | POA: Diagnosis not present

## 2015-09-19 DIAGNOSIS — Z7982 Long term (current) use of aspirin: Secondary | ICD-10-CM | POA: Diagnosis not present

## 2015-09-19 DIAGNOSIS — M25569 Pain in unspecified knee: Secondary | ICD-10-CM | POA: Diagnosis not present

## 2015-09-19 DIAGNOSIS — R51 Headache: Secondary | ICD-10-CM | POA: Diagnosis not present

## 2015-09-19 DIAGNOSIS — R200 Anesthesia of skin: Secondary | ICD-10-CM | POA: Diagnosis not present

## 2015-09-19 DIAGNOSIS — R4781 Slurred speech: Secondary | ICD-10-CM | POA: Diagnosis not present

## 2015-09-19 DIAGNOSIS — I739 Peripheral vascular disease, unspecified: Secondary | ICD-10-CM | POA: Diagnosis not present

## 2015-09-19 DIAGNOSIS — R202 Paresthesia of skin: Secondary | ICD-10-CM | POA: Diagnosis not present

## 2015-09-19 DIAGNOSIS — I639 Cerebral infarction, unspecified: Secondary | ICD-10-CM | POA: Diagnosis not present

## 2015-09-19 DIAGNOSIS — I10 Essential (primary) hypertension: Secondary | ICD-10-CM | POA: Diagnosis not present

## 2015-09-19 DIAGNOSIS — E039 Hypothyroidism, unspecified: Secondary | ICD-10-CM | POA: Diagnosis not present

## 2015-09-19 DIAGNOSIS — Z7901 Long term (current) use of anticoagulants: Secondary | ICD-10-CM | POA: Diagnosis not present

## 2015-09-19 DIAGNOSIS — F419 Anxiety disorder, unspecified: Secondary | ICD-10-CM | POA: Diagnosis not present

## 2015-09-19 DIAGNOSIS — Z794 Long term (current) use of insulin: Secondary | ICD-10-CM | POA: Diagnosis not present

## 2015-09-19 DIAGNOSIS — R209 Unspecified disturbances of skin sensation: Secondary | ICD-10-CM | POA: Diagnosis not present

## 2015-09-19 DIAGNOSIS — E119 Type 2 diabetes mellitus without complications: Secondary | ICD-10-CM | POA: Diagnosis not present

## 2015-09-19 DIAGNOSIS — E785 Hyperlipidemia, unspecified: Secondary | ICD-10-CM | POA: Diagnosis not present

## 2015-09-19 DIAGNOSIS — G451 Carotid artery syndrome (hemispheric): Secondary | ICD-10-CM | POA: Diagnosis not present

## 2015-09-19 DIAGNOSIS — R5381 Other malaise: Secondary | ICD-10-CM | POA: Diagnosis not present

## 2015-09-19 DIAGNOSIS — R531 Weakness: Secondary | ICD-10-CM | POA: Diagnosis not present

## 2015-09-19 DIAGNOSIS — R296 Repeated falls: Secondary | ICD-10-CM | POA: Diagnosis not present

## 2015-09-19 LAB — BASIC METABOLIC PANEL
Anion gap: 8 (ref 5–15)
BUN: 13 mg/dL (ref 6–20)
CALCIUM: 9.5 mg/dL (ref 8.9–10.3)
CO2: 29 mmol/L (ref 22–32)
CREATININE: 0.84 mg/dL (ref 0.44–1.00)
Chloride: 104 mmol/L (ref 101–111)
GFR calc Af Amer: >60 mL/min (ref >60)
GFR calc non Af Amer: >60 mL/min (ref >60)
GLUCOSE: 180 mg/dL — AB (ref 65–99)
Potassium: 4.3 mmol/L (ref 3.5–5.1)
Sodium: 141 mmol/L (ref 135–145)

## 2015-09-19 LAB — GLUCOSE, CAPILLARY
GLUCOSE-CAPILLARY: 167 mg/dL — AB (ref 65–99)
GLUCOSE-CAPILLARY: 169 mg/dL — AB (ref 65–99)
GLUCOSE-CAPILLARY: 110 mg/dL — AB (ref 65–99)
Glucose-Capillary: 115 mg/dL — ABNORMAL HIGH (ref 65–99)

## 2015-09-19 LAB — CBC
HCT: 36.3 % (ref 35.0–47.0)
Hemoglobin: 11.9 g/dL — ABNORMAL LOW (ref 12.0–16.0)
MCH: 28 pg (ref 26.0–34.0)
MCHC: 32.8 g/dL (ref 32.0–36.0)
MCV: 85.4 fL (ref 80.0–100.0)
PLATELETS: 242 10*3/uL (ref 150–440)
RBC: 4.25 MIL/uL (ref 3.80–5.20)
RDW: 15.4 % — ABNORMAL HIGH (ref 11.5–14.5)
WBC: 7.4 10*3/uL (ref 3.6–11.0)

## 2015-09-19 MED ORDER — ASPIRIN 325 MG PO TABS
325.0000 mg | ORAL_TABLET | Freq: Every day | ORAL | Status: DC
Start: 2015-09-20 — End: 2015-09-20
  Administered 2015-09-20: 325 mg via ORAL
  Filled 2015-09-19: qty 1

## 2015-09-19 NOTE — Plan of Care (Signed)
Problem: Health Behavior/Discharge Planning: Goal: Ability to manage health-related needs will improve Outcome: Progressing Pt denies pain. No neuro deficits observed. No other signs of distress noted. Will continue to monitor.

## 2015-09-19 NOTE — Progress Notes (Signed)
*  PRELIMINARY RESULTS* Echocardiogram 2D Echocardiogram has been performed.  Diane Mullins 09/19/2015, 11:12 AM

## 2015-09-19 NOTE — Progress Notes (Signed)
PT Cancellation Note  Patient Details Name: Diane Mullins MRN: [id number] DOB: March 15, 1933   Cancelled Treatment:    Reason Eval/Treat Not Completed: Patient at procedure or test/unavailable Alanson Puls, PT, DPT  Arelia Sneddon S 09/19/2015, 12:49 PM

## 2015-09-19 NOTE — Consult Note (Signed)
CC: RUE numbness   HPI: Diane Mullins is an 83 y.o. femalewith a known history of hypertension, insulin-dependent diabetes mellitus, hypothyroidism and hyperlipidemia presents to the hospital secondary to sudden onset of right hand numbness and also right-sided numbness associated with slurred speech that started this morning. Pt was on ASA 81 at home. Now back to baseline.    Past Medical History  Diagnosis Date  . Hyperlipidemia   . Hypertension   . Diabetes mellitus   . Thyroid disease   . H/O: rheumatic fever     Past Surgical History  Procedure Laterality Date  . Vaginal delivery      x4  . Abdominal hysterectomy  1973    menorrhagia  . Hernia repair  1994    Family History  Problem Relation Age of Onset  . Heart disease Mother 64  . Heart attack Mother 55  . Cancer Father     colon  . Diabetes Sister   . Hypertension Sister   . Cancer Brother     kidney  . Diabetes Brother     Social History:  reports that she has never smoked. She has never used smokeless tobacco. She reports that she does not drink alcohol or use illicit drugs.  No Known Allergies  Medications: I have reviewed the patient's current medications.  ROS: History obtained from the patient  General ROS: negative for - chills, fatigue, fever, night sweats, weight gain or weight loss Psychological ROS: negative for - behavioral disorder, hallucinations, memory difficulties, mood swings or suicidal ideation Ophthalmic ROS: negative for - blurry vision, double vision, eye pain or loss of vision ENT ROS: negative for - epistaxis, nasal discharge, oral lesions, sore throat, tinnitus or vertigo Allergy and Immunology ROS: negative for - hives or itchy/watery eyes Hematological and Lymphatic ROS: negative for - bleeding problems, bruising or swollen lymph nodes Endocrine ROS: negative for - galactorrhea, hair pattern changes, polydipsia/polyuria or temperature intolerance Respiratory ROS: negative for  - cough, hemoptysis, shortness of breath or wheezing Cardiovascular ROS: negative for - chest pain, dyspnea on exertion, edema or irregular heartbeat Gastrointestinal ROS: negative for - abdominal pain, diarrhea, hematemesis, nausea/vomiting or stool incontinence Genito-Urinary ROS: negative for - dysuria, hematuria, incontinence or urinary frequency/urgency Musculoskeletal ROS: negative for - joint swelling or muscular weakness Neurological ROS: as noted in HPI Dermatological ROS: negative for rash and skin lesion changes  Physical Examination: Blood pressure 122/45, pulse 67, temperature 97.7 F (36.5 C), temperature source Oral, resp. rate 20, height 5\' 1"  (1.549 m), weight 207 lb (93.895 kg), SpO2 96 %.    Neurological Examination Mental Status: Alert, oriented, thought content appropriate.  Speech fluent without evidence of aphasia.  Able to follow 3 step commands without difficulty. Cranial Nerves: II: Discs flat bilaterally; Visual fields grossly normal, pupils equal, round, reactive to light and accommodation III,IV, VI: ptosis not present, extra-ocular motions intact bilaterally V,VII: smile symmetric, facial light touch sensation normal bilaterally VIII: hearing normal bilaterally IX,X: gag reflex present XI: bilateral shoulder shrug XII: midline tongue extension Motor: Right : Upper extremity   5/5    Left:     Upper extremity   5/5  Lower extremity   5/5     Lower extremity   5/5 Tone and bulk:normal tone throughout; no atrophy noted Sensory: Pinprick and light touch intact throughout, bilaterally Deep Tendon Reflexes: 1+ and symmetric throughout Plantars: Right: downgoing   Left: downgoing Cerebellar: normal finger-to-nose, normal rapid alternating movements and normal heel-to-shin test  Gait: not tested.      Laboratory Studies:   Basic Metabolic Panel:  Recent Labs Lab 09/18/15 1316 09/19/15 0527  NA 141 141  K 4.0 4.3  CL 103 104  CO2 29 29  GLUCOSE  197* 180*  BUN 14 13  CREATININE 0.84 0.84  CALCIUM 9.5 9.5    Liver Function Tests:  Recent Labs Lab 09/18/15 1316  AST 18  ALT 20  ALKPHOS 99  BILITOT 0.6  PROT 7.0  ALBUMIN 3.9   No results for input(s): LIPASE, AMYLASE in the last 168 hours. No results for input(s): AMMONIA in the last 168 hours.  CBC:  Recent Labs Lab 09/18/15 1316 09/19/15 0527  WBC 9.4 7.4  NEUTROABS 4.9  --   HGB 12.5 11.9*  HCT 38.6 36.3  MCV 86.5 85.4  PLT 257 242    Cardiac Enzymes:  Recent Labs Lab 09/18/15 1316  TROPONINI <0.03    BNP: Invalid input(s): POCBNP  CBG:  Recent Labs Lab 09/18/15 1733 09/18/15 2058 09/19/15 0719 09/19/15 1137 09/19/15 1600  GLUCAP 139* 241* 167* 169* 115*    Microbiology: Results for orders placed or performed during the hospital encounter of 07/25/15  Urine culture     Status: None   Collection Time: 07/25/15 11:45 AM  Result Value Ref Range Status   Specimen Description URINE, RANDOM  Final   Special Requests NONE  Final   Culture MULTIPLE SPECIES PRESENT, SUGGEST RECOLLECTION  Final   Report Status 07/27/2015 FINAL  Final    Coagulation Studies:  Recent Labs  09/18/15 1316  LABPROT 12.6  INR 0.92    Urinalysis: No results for input(s): COLORURINE, LABSPEC, PHURINE, GLUCOSEU, HGBUR, BILIRUBINUR, KETONESUR, PROTEINUR, UROBILINOGEN, NITRITE, LEUKOCYTESUR in the last 168 hours.  Invalid input(s): APPERANCEUR  Lipid Panel:     Component Value Date/Time   CHOL 184 09/18/2015 1316   CHOL 170 09/04/2013 0825   TRIG 135 09/18/2015 1316   HDL 54 09/18/2015 1316   HDL 50 09/04/2013 0825   CHOLHDL 3.4 09/18/2015 1316   VLDL 27 09/18/2015 1316   LDLCALC 103* 09/18/2015 1316   LDLCALC 81 09/04/2013 0825    HgbA1C:  Lab Results  Component Value Date   HGBA1C 9.1* 03/05/2015    Urine Drug Screen:  No results found for: LABOPIA, COCAINSCRNUR, LABBENZ, AMPHETMU, THCU, LABBARB  Alcohol Level: No results for input(s): ETH  in the last 168 hours.  Other results: EKG: normal EKG, normal sinus rhythm, unchanged from previous tracings.  Imaging: Ct Head Wo Contrast  09/18/2015  CLINICAL DATA:  Right arm numbness.  Difficulty speaking. EXAM: CT HEAD WITHOUT CONTRAST TECHNIQUE: Contiguous axial images were obtained from the base of the skull through the vertex without intravenous contrast. COMPARISON:  MRI of 11/10/2005 FINDINGS: Sinuses/Soft tissues: Clear paranasal sinuses and mastoid air cells. Intracranial: Mild for age low density in the periventricular white matter likely related to small vessel disease. No mass lesion, hemorrhage, hydrocephalus, acute infarct, intra-axial, or extra-axial fluid collection. IMPRESSION: 1.  No acute intracranial abnormality. 2. Mild for age small vessel ischemic change. Electronically Signed   By: Abigail Miyamoto M.D.   On: 09/18/2015 13:55   US Carotid Bilateral  09/19/2015  CLINICAL DATA:  Transient ischemic attack, right-sided weakness. EXAM: BILATERAL CAROTID DUPLEX ULTRASOUND TECHNIQUE: Pearline Cables scale imaging, color Doppler and duplex ultrasound were performed of bilateral carotid and vertebral arteries in the neck. COMPARISON:  None. FINDINGS: Criteria: Quantification of carotid stenosis is based on velocity parameters that correlate  the residual internal carotid diameter with NASCET-based stenosis levels, using the diameter of the distal internal carotid lumen as the denominator for stenosis measurement. The following velocity measurements were obtained: RIGHT ICA:  76/17 cm/sec CCA:  123XX123 cm/sec SYSTOLIC ICA/CCA RATIO:  0.9 DIASTOLIC ICA/CCA RATIO:  0.9 ECA:  71 cm/sec LEFT ICA:  90/21 cm/sec CCA:  99991111 cm/sec SYSTOLIC ICA/CCA RATIO:  1.0 DIASTOLIC ICA/CCA RATIO:  1.5 ECA:  60 cm/sec RIGHT CAROTID ARTERY: Mild eccentric plaque formation is noted in the midportion of the right internal carotid artery consistent with less than 50% diameter stenosis based on ultrasound and Doppler criteria.  RIGHT VERTEBRAL ARTERY:  Antegrade flow is noted. LEFT CAROTID ARTERY: Mild eccentric calcified plaque formation is noted in the left carotid bulb and proximal left internal carotid artery consistent with less than 50% diameter stenosis based on ultrasound and Doppler criteria. LEFT VERTEBRAL ARTERY:  Antegrade flow is noted. IMPRESSION: No hemodynamically significant stenosis or plaque is noted in either cervical carotid artery. Electronically Signed   By: Marijo Conception, M.D.   On: 09/19/2015 10:30     Assessment/Plan: 82 y.o. femalewith a known history of hypertension, insulin-dependent diabetes mellitus, hypothyroidism and hyperlipidemia presents to the hospital secondary to sudden onset of right hand numbness and also right-sided numbness associated with slurred speech that started this morning. Pt was on ASA 81 at home. Now back to baseline.    - Increase to ASA 325 daily - MRI brain pending -2d echo/carotid - PT/OT - d/c planning likely tomorrow.    09/19/2015, 7:01 PM

## 2015-09-19 NOTE — Progress Notes (Signed)
[facility] NAME: Diane Mullins    MR#:  [id number]  DATE OF BIRTH:  10-02-1932  SUBJECTIVE: Admitted for stroke symptoms. Right-sided numbness and slurred speech. Went for MRI of the brain, echocardiogram.   CHIEF COMPLAINT:   Chief Complaint  Patient presents with  . Stroke Symptoms    REVIEW OF SYSTEMS:    Review of Systems  Constitutional: Negative for fever and chills.  HENT: Negative for hearing loss.   Eyes: Negative for blurred vision, double vision and photophobia.  Respiratory: Negative for cough, hemoptysis and shortness of breath.   Cardiovascular: Negative for palpitations, orthopnea and leg swelling.  Gastrointestinal: Negative for vomiting, abdominal pain and diarrhea.  Genitourinary: Negative for dysuria and urgency.  Musculoskeletal: Negative for myalgias and neck pain.  Skin: Negative for rash.  Neurological: Positive for speech change and focal weakness. Negative for dizziness, seizures, weakness and headaches.  Psychiatric/Behavioral: Negative for memory loss. The patient does not have insomnia.     Nutrition:  Tolerating Diet: Tolerating PT:      DRUG ALLERGIES:  No Known Allergies  VITALS:  Blood pressure 127/47, pulse 71, temperature 97.8 F (36.6 C), temperature source Oral, resp. rate 20, height 5\' 1"  (1.549 m), weight 93.895 kg (207 lb), SpO2 95 %.  PHYSICAL EXAMINATION:   Physical Exam  GENERAL:  82 y.o.-year-old patient lying in the bed with no acute distress.  EYES: Pupils equal, round, reactive to light and accommodation. No scleral icterus. Extraocular muscles intact.  HEENT: Head atraumatic, normocephalic. Oropharynx and nasopharynx clear.  NECK:  Supple, no jugular venous distention. No thyroid enlargement, no tenderness.  LUNGS: Normal breath sounds bilaterally, no wheezing, rales,rhonchi or crepitation. No use of accessory muscles of respiration.  CARDIOVASCULAR: S1, S2  normal. No murmurs, rubs, or gallops.  ABDOMEN: Soft, nontender, nondistended. Bowel sounds present. No organomegaly or mass.  EXTREMITIES: No pedal edema, cyanosis, or clubbing.  NEUROLOGIC: Cranial nerves II through XII are intact. Muscle strength 3/5 in  Right  extremitiy, 5/5 the left side.. Sensation intact. Gait not checked.  PSYCHIATRIC: The patient is alert and oriented x 3.  SKIN: No obvious rash, lesion, or ulcer.    LABORATORY PANEL:   CBC  Recent Labs Lab 09/19/15 0527  WBC 7.4  HGB 11.9*  HCT 36.3  PLT 242   ------------------------------------------------------------------------------------------------------------------  Chemistries   Recent Labs Lab 09/18/15 1316 09/19/15 0527  NA 141 141  K 4.0 4.3  CL 103 104  CO2 29 29  GLUCOSE 197* 180*  BUN 14 13  CREATININE 0.84 0.84  CALCIUM 9.5 9.5  AST 18  --   ALT 20  --   ALKPHOS 99  --   BILITOT 0.6  --    ------------------------------------------------------------------------------------------------------------------  Cardiac Enzymes  Recent Labs Lab 09/18/15 1316  TROPONINI <0.03   ------------------------------------------------------------------------------------------------------------------  RADIOLOGY:  Ct Head Wo Contrast  09/18/2015  CLINICAL DATA:  Right arm numbness.  Difficulty speaking. EXAM: CT HEAD WITHOUT CONTRAST TECHNIQUE: Contiguous axial images were obtained from the base of the skull through the vertex without intravenous contrast. COMPARISON:  MRI of 11/10/2005 FINDINGS: Sinuses/Soft tissues: Clear paranasal sinuses and mastoid air cells. Intracranial: Mild for age low density in the periventricular white matter likely related to small vessel disease. No mass lesion, hemorrhage, hydrocephalus, acute infarct, intra-axial, or extra-axial fluid collection. IMPRESSION: 1.  No acute intracranial abnormality. 2. Mild for age small vessel ischemic change. Electronically Signed   By: Marylyn Ishihara  Jobe Igo M.D.   On: 09/18/2015 13:55   US Carotid Bilateral  09/19/2015  CLINICAL DATA:  Transient ischemic attack, right-sided weakness. EXAM: BILATERAL CAROTID DUPLEX ULTRASOUND TECHNIQUE: Pearline Cables scale imaging, color Doppler and duplex ultrasound were performed of bilateral carotid and vertebral arteries in the neck. COMPARISON:  None. FINDINGS: Criteria: Quantification of carotid stenosis is based on velocity parameters that correlate the residual internal carotid diameter with NASCET-based stenosis levels, using the diameter of the distal internal carotid lumen as the denominator for stenosis measurement. The following velocity measurements were obtained: RIGHT ICA:  76/17 cm/sec CCA:  123XX123 cm/sec SYSTOLIC ICA/CCA RATIO:  0.9 DIASTOLIC ICA/CCA RATIO:  0.9 ECA:  71 cm/sec LEFT ICA:  90/21 cm/sec CCA:  99991111 cm/sec SYSTOLIC ICA/CCA RATIO:  1.0 DIASTOLIC ICA/CCA RATIO:  1.5 ECA:  60 cm/sec RIGHT CAROTID ARTERY: Mild eccentric plaque formation is noted in the midportion of the right internal carotid artery consistent with less than 50% diameter stenosis based on ultrasound and Doppler criteria. RIGHT VERTEBRAL ARTERY:  Antegrade flow is noted. LEFT CAROTID ARTERY: Mild eccentric calcified plaque formation is noted in the left carotid bulb and proximal left internal carotid artery consistent with less than 50% diameter stenosis based on ultrasound and Doppler criteria. LEFT VERTEBRAL ARTERY:  Antegrade flow is noted. IMPRESSION: No hemodynamically significant stenosis or plaque is noted in either cervical carotid artery. Electronically Signed   By: Marijo Conception, M.D.   On: 09/19/2015 10:30     ASSESSMENT AND PLAN:   Active Problems:   TIA (transient ischemic attack)  Right-sided weakness concerning for acute stroke: Carotid ultrasound did not show any significant stenosis. Follow-up MRI of the brain, echocardiogram. CT head did not show acute distress. Speech therapy consult, physical therapy consult  ordered. The aspirin, statins. 2 .diabetes mellitus type 2 controlled continue Amaryl, Seinfeld coverage, vomiting. 3.: Hypertension controlled continue metoprolol, losartan. Keep SBP  [id number].   All the records are reviewed and case discussed with Care Management/Social Workerr. Management plans discussed with the patient, family and they are in agreement.  CODE STATUS: full  TOTAL TIME TAKING CARE OF THIS PATIENT: 35 minutes.   POSSIBLE D/C IN 1-2 DAYS, DEPENDING ON CLINICAL CONDITION.   Epifanio Lesches M.D on 09/19/2015 at 11:55 AM  Between 7am to 6pm - Pager - [phone]  After 6pm go to [email or web address] - password [email or web address] [facility]  Office  [phone]  CC: Primary care physician; Rica Mast, MD

## 2015-09-20 ENCOUNTER — Observation Stay: Payer: Commercial Managed Care - HMO

## 2015-09-20 DIAGNOSIS — R200 Anesthesia of skin: Secondary | ICD-10-CM | POA: Diagnosis not present

## 2015-09-20 DIAGNOSIS — E785 Hyperlipidemia, unspecified: Secondary | ICD-10-CM | POA: Diagnosis not present

## 2015-09-20 DIAGNOSIS — I10 Essential (primary) hypertension: Secondary | ICD-10-CM | POA: Diagnosis not present

## 2015-09-20 DIAGNOSIS — R4781 Slurred speech: Secondary | ICD-10-CM | POA: Diagnosis not present

## 2015-09-20 DIAGNOSIS — E119 Type 2 diabetes mellitus without complications: Secondary | ICD-10-CM | POA: Diagnosis not present

## 2015-09-20 DIAGNOSIS — G459 Transient cerebral ischemic attack, unspecified: Secondary | ICD-10-CM | POA: Diagnosis not present

## 2015-09-20 DIAGNOSIS — R202 Paresthesia of skin: Secondary | ICD-10-CM | POA: Diagnosis not present

## 2015-09-20 LAB — GLUCOSE, CAPILLARY
Glucose-Capillary: 140 mg/dL — ABNORMAL HIGH (ref 65–99)
Glucose-Capillary: 176 mg/dL — ABNORMAL HIGH (ref 65–99)
Glucose-Capillary: 143 mg/dL — ABNORMAL HIGH (ref 65–99)

## 2015-09-20 MED ORDER — ASPIRIN 325 MG PO TABS: 325.0000 mg | ORAL_TABLET | Freq: Every day | ORAL | Status: DC

## 2015-09-20 NOTE — Discharge Summary (Signed)
Diane Mullins, is a 82 y.o. female  DOB November 25, 1932  MRN [id number].  Admission date:  09/18/2015  Admitting Physician  Gladstone Lighter, MD  Discharge Date:  09/20/2015   Primary MD  Rica Mast, MD  Recommendations for primary care physician for things to follow:   FOLLOW UP WITH  PMD IN ONE WEEK   Admission Diagnosis  Paresthesia of right arm [R20.2] Cerebral infarction due to unspecified mechanism [I63.9]   Discharge Diagnosis  Paresthesia of right arm [R20.2] Cerebral infarction due to unspecified mechanism [I63.9]    Active Problems:   TIA (transient ischemic attack)      Past Medical History  Diagnosis Date  . Hyperlipidemia   . Hypertension   . Diabetes mellitus   . Thyroid disease   . H/O: rheumatic fever     Past Surgical History  Procedure Laterality Date  . Vaginal delivery      x4  . Abdominal hysterectomy  1973    menorrhagia  . Hernia repair  1994       History of present illness and  Hospital Course:     Kindly see H&P for history of present illness and admission details, please review complete Labs, Consult reports and Test reports for all details in brief  HPI  from the history and physical done on the day of admission 81 year old female with hypertension, insulin-dependent diabetes mellitus, hypothyroidism, hyperlipidemia complaints of sudden onset of numbness of the right hand, slurred speech. Admitted for TIA evaluation.   Hospital Course   #1 right-sided numbnesstroubles: Concern for possible CVA versus TIA. Patient admitted to the stroke unit. Patient continued on aspirin. Seen by neurology. MRI of the brain, Carotid  ultrasound, echocardiogram ordered. MRI of the brain is pending at this time. But echo showed EF more than 50%. Ultrasound of carotids did not show any  hemodynamically significant stenosis. Physical therapy recommended home physical therapy with rolling walker. If the MRI of the brain is negative patient will be discharged home without home physical therapy. Patient can continue aspirin 325 mg daily instead of 81 mg daily Or numbness of the right side, speech troubles completely resolved at this time. No weakness of hands or legs.  complaints of falls at home and also knee pains. Physical therapy recommended home health physical therapy. 2. Debilitated type II: Continue controlled continue Amaryl,  #3 hypertension controlled continue metoprolol, losartan.  Discharge Condition: stable.   Follow UP With primary doctor in 1 week.     Discharge Instructions  and  Discharge Medications     Discharge Instructions    DME Other see comment    Complete by:  As directed   Rolling walker(youth side)     Face-to-face encounter (required for Medicare/Medicaid patients)    Complete by:  As directed   I Diane Mullins certify that this patient is under my care and that I, or a nurse practitioner or physician's assistant working with me, had a face-to-face encounter that meets the physician face-to-face encounter requirements with this patient on 09/20/2015. The encounter with the patient was in whole, or in part for the following medical condition(s) which is the primary reason for home health care  htn H/o falls DMII  The encounter with the patient was in whole, or in part, for the following medical condition, which is the primary reason for home health care:  whole  I certify that, based on my findings, the following services are medically necessary home health services:  Physical therapy  Reason for Medically Necessary Home Health Services:  Therapy- Personnel officer, Public librarian  My clinical findings support the need for the above services:  Unable to leave home safely without assistance and/or assistive device  Further,  I certify that my clinical findings support that this patient is homebound due to:  Unsafe ambulation due to balance issues  Needs rolling walker(youth size)     Home Health    Complete by:  As directed   To provide the following care/treatments:  PT            Medication List    TAKE these medications        ACCU-CHEK AVIVA PLUS test strip  Generic drug:  glucose blood  PLEASE USE AT HOME TO CHECK BLOOD SUGARS TWICE A DAY     Alcohol Swabs Pads  Please use to cleanse skin before testing your blood sugars at home 1-2 times daily.     ALPRAZolam 0.25 MG tablet  Commonly known as:  XANAX  Take 1 tablet (0.25 mg total) by mouth 3 (three) times daily as needed for sleep or anxiety.     amoxicillin 500 MG capsule  Commonly known as:  AMOXIL  Take 2,000 mg by mouth See admin instructions. Take 4 capsules (2000mg ) orally 1 hour prior to appointment.     aspirin EC 81 MG tablet  Take 81 mg by mouth daily.     calcium-vitamin D 250-125 MG-UNIT tablet  Commonly known as:  OSCAL  Take 1 tablet by mouth daily.     cholecalciferol 1000 units tablet  Commonly known as:  VITAMIN D  Take 1,000 Units by mouth daily.     fish oil-omega-3 fatty acids 1000 MG capsule  Take 1 g by mouth 2 (two) times daily.     furosemide 20 MG tablet  Commonly known as:  LASIX  TAKE 1 TABLET EVERY DAY     glimepiride 4 MG tablet  Commonly known as:  AMARYL  TAKE 1 TABLET EVERY MORNING  AND TAKE 1/2 TABLET EVERY EVENING     Insulin Pen Needle 31G X 5 MM Misc  Commonly known as:  B-D UF III MINI PEN NEEDLES  Please use needles as directed once a day with your insulin pen. Dx code 250.00     LANTUS SOLOSTAR 100 UNIT/ML Solostar Pen  Generic drug:  Insulin Glargine  INJECT  15 UNITS SUBCUTANEOUSLY IN THE MORNING     levothyroxine 75 MCG tablet  Commonly known as:  SYNTHROID, LEVOTHROID  TAKE 1 TABLET EVERY DAY     losartan 50 MG tablet  Commonly known as:  COZAAR  Take 1 tablet (50 mg total)  by mouth daily.     metFORMIN 1000 MG tablet  Commonly known as:  GLUCOPHAGE  TAKE 1 TABLET TWICE DAILY  WITH  A  MEAL     metoprolol succinate 100 MG 24 hr tablet  Commonly known as:  TOPROL-XL  Take 1 tablet (100 mg total) by mouth daily.     multivitamin with minerals tablet  Take 1 tablet by mouth daily.     omeprazole 20 MG capsule  Commonly known as:  PRILOSEC  TAKE 1 CAPSULE EVERY DAY     simvastatin 20 MG tablet  Commonly known as:  ZOCOR  TAKE 1 TABLET EVERY EVENING          Diet and Activity recommendation: See Discharge Instructions above Continue  low-sodium ADA diet  Consults obtained -  neurology, physical therapy   Major procedures and Radiology Reports - PLEASE review detailed and final reports for all details, in brief -     Ct Head Wo Contrast  09/18/2015  CLINICAL DATA:  Right arm numbness.  Difficulty speaking. EXAM: CT HEAD WITHOUT CONTRAST TECHNIQUE: Contiguous axial images were obtained from the base of the skull through the vertex without intravenous contrast. COMPARISON:  MRI of 11/10/2005 FINDINGS: Sinuses/Soft tissues: Clear paranasal sinuses and mastoid air cells. Intracranial: Mild for age low density in the periventricular white matter likely related to small vessel disease. No mass lesion, hemorrhage, hydrocephalus, acute infarct, intra-axial, or extra-axial fluid collection. IMPRESSION: 1.  No acute intracranial abnormality. 2. Mild for age small vessel ischemic change. Electronically Signed   By: Abigail Miyamoto M.D.   On: 09/18/2015 13:55   US Carotid Bilateral  09/19/2015  CLINICAL DATA:  Transient ischemic attack, right-sided weakness. EXAM: BILATERAL CAROTID DUPLEX ULTRASOUND TECHNIQUE: Pearline Cables scale imaging, color Doppler and duplex ultrasound were performed of bilateral carotid and vertebral arteries in the neck. COMPARISON:  None. FINDINGS: Criteria: Quantification of carotid stenosis is based on velocity parameters that correlate the residual  internal carotid diameter with NASCET-based stenosis levels, using the diameter of the distal internal carotid lumen as the denominator for stenosis measurement. The following velocity measurements were obtained: RIGHT ICA:  76/17 cm/sec CCA:  123XX123 cm/sec SYSTOLIC ICA/CCA RATIO:  0.9 DIASTOLIC ICA/CCA RATIO:  0.9 ECA:  71 cm/sec LEFT ICA:  90/21 cm/sec CCA:  99991111 cm/sec SYSTOLIC ICA/CCA RATIO:  1.0 DIASTOLIC ICA/CCA RATIO:  1.5 ECA:  60 cm/sec RIGHT CAROTID ARTERY: Mild eccentric plaque formation is noted in the midportion of the right internal carotid artery consistent with less than 50% diameter stenosis based on ultrasound and Doppler criteria. RIGHT VERTEBRAL ARTERY:  Antegrade flow is noted. LEFT CAROTID ARTERY: Mild eccentric calcified plaque formation is noted in the left carotid bulb and proximal left internal carotid artery consistent with less than 50% diameter stenosis based on ultrasound and Doppler criteria. LEFT VERTEBRAL ARTERY:  Antegrade flow is noted. IMPRESSION: No hemodynamically significant stenosis or plaque is noted in either cervical carotid artery. Electronically Signed   By: Marijo Conception, M.D.   On: 09/19/2015 10:30    Micro Results     No results found for this or any previous visit (from the past 240 hour(s)).     Today   Subjective:   Diane Mullins today has no headache,no chest abdominal pain,no new weakness tingling or numbness, feels much better wants to go home today.   Objective:   Blood pressure 122/60, pulse 78, temperature 98.1 F (36.7 C), temperature source Oral, resp. rate 18, height 5\' 1"  (1.549 m), weight 93.895 kg (207 lb), SpO2 95 %.   Intake/Output Summary (Last 24 hours) at 09/20/15 1203 Last data filed at 09/20/15 0916  Gross per 24 hour  Intake    720 ml  Output   2720 ml  Net  -2000 ml    Exam Awake Alert, Oriented x 3, No new F.N deficits, Normal affect [place].AT,PERRAL Supple Neck,No JVD, No cervical lymphadenopathy appriciated.   Symmetrical Chest wall movement, Good air movement bilaterally, CTAB RRR,No Gallops,Rubs or new Murmurs, No Parasternal Heave +ve B.Sounds, Abd Soft, Non tender, No organomegaly appriciated, No rebound -guarding or rigidity. No Cyanosis, Clubbing or edema, No new Rash or bruise  Data Review   CBC w Diff: Lab Results  Component Value Date   WBC 7.4 09/19/2015   WBC 9.3  07/16/2015   HGB 11.9* 09/19/2015   HCT 36.3 09/19/2015   HCT 39.0 07/16/2015   PLT 242 09/19/2015   LYMPHOPCT 34 09/18/2015   MONOPCT 11 09/18/2015   EOSPCT 3 09/18/2015   BASOPCT 1 09/18/2015    CMP: Lab Results  Component Value Date   NA 141 09/19/2015   NA 140 03/05/2015   K 4.3 09/19/2015   CL 104 09/19/2015   CO2 29 09/19/2015   BUN 13 09/19/2015   BUN 14 03/05/2015   CREATININE 0.84 09/19/2015   CREATININE 0.9 03/05/2015   GLU 133 12/02/2014   PROT 7.0 09/18/2015   PROT 6.5 09/04/2013   ALBUMIN 3.9 09/18/2015   ALBUMIN 4.1 09/04/2013   BILITOT 0.6 09/18/2015   ALKPHOS 99 09/18/2015   AST 18 09/18/2015   ALT 20 09/18/2015  .   Total Time in preparing paper work, data evaluation and todays exam - 45 minutes  Neomia Herbel M.D on 09/20/2015 at 12:03 PM    Note: This dictation was prepared with Dragon dictation along with smaller phrase technology. Any transcriptional errors that result from this process are unintentional.

## 2015-09-20 NOTE — Progress Notes (Signed)
Discharge paperwork reviewed and given by [facility] RN. Pt packed and ready, granddaughter, Ria Comment, transported pt home.

## 2015-09-20 NOTE — Evaluation (Addendum)
Physical Therapy Evaluation Patient Details Name: Diane Mullins MRN: [id number] DOB: [birth year] Today's Date: 09/20/2015   History of Present Illness  Pt is an 82 y.o. female presenting to hospital with sudden onset R hand numbness, R sided numbness, slurred speech, and blurred vision.  Pt admitted to hospital for possible TIA.  CT of head negative.  MRI/MRA pending.  PMH includes htn, IDDM, hypothyroidism, HLD, recent fall (Nov) with RC injury.  Clinical Impression  Prior to admission, pt was modified independent using rollator for functional mobility.  Pt reports h/o falls (most recent documented fall in November 2016) and pt reports having more falls than her family knows of.  Pt does report that she has not fallen since using rollator but can't fit rollator into bathroom and has issues d/t this.  Pt lives alone in 1 level home with 3 steps to enter with railing.  Currently pt is CGA with transfers and ambulation with RW.  Pt would benefit from skilled PT to address noted impairments and functional limitations.  Recommend pt discharge to home with HHPT and use of RW (to improve balance and also easier to fit into bathroom) when medically appropriate.     Follow Up Recommendations Home health PT (assist for stairs; support of family)    Equipment Recommendations  Rolling walker with 5" wheels (Youth sized)    Recommendations for Other Services       Precautions / Restrictions Precautions Precautions: Fall Restrictions Weight Bearing Restrictions: No      Mobility  Bed Mobility               General bed mobility comments: Deferred d/t pt already sitting up in chair  Transfers Overall transfer level: Needs assistance Equipment used: Rolling walker (2 wheeled) Transfers: Sit to/from Stand Sit to Stand: Min guard         General transfer comment: steady without loss of balance  Ambulation/Gait Ambulation/Gait assistance: Min guard Ambulation Distance (Feet): 160  Feet Assistive device: Rolling walker (2 wheeled)   Gait velocity: decreased   General Gait Details: initially appearing mildly unsteady (but still CGA) but improved with distance; mild increased B lateral sway in general; no loss of balance with turns or in general; decreased B step length/foot clearance/heelstrike  Stairs            Wheelchair Mobility    Modified Rankin (Stroke Patients Only)       Balance Overall balance assessment: Needs assistance Sitting-balance support: No upper extremity supported;Feet supported Sitting balance-Leahy Scale: Good     Standing balance support: Bilateral upper extremity supported (on RW) Standing balance-Leahy Scale: Good                               Pertinent Vitals/Pain Pain Assessment: No/denies pain  Vitals stable and WFL throughout treatment session.    Home Living Family/patient expects to be discharged to:: Private residence Living Arrangements: Alone Available Help at Discharge: Family   Home Access: Stairs to enter Entrance Stairs-Rails: Left Entrance Stairs-Number of Steps: 3 Home Layout: One level Home Equipment: Walker - 4 wheels      Prior Function Level of Independence: Independent with assistive device(s)         Comments: Pt reports being independent with rollator and has not fallen since using rollator (but has difficulty in bathroom d/t rollator does not fit in bathroom).     Hand Dominance  Extremity/Trunk Assessment   Upper Extremity Assessment: Overall WFL for tasks assessed (except decreased AROM R shoulder flexion to 70 degrees (pt reports from fall with RC injury))           Lower Extremity Assessment: Overall WFL for tasks assessed         Communication   Communication: No difficulties  Cognition Arousal/Alertness: Awake/alert Behavior During Therapy: WFL for tasks assessed/performed Overall Cognitive Status: Within Functional Limits for tasks assessed                       General Comments   Nursing cleared pt for participation in physical therapy.  Pt agreeable to PT session.    Exercises        Assessment/Plan    PT Assessment Patient needs continued PT services  PT Diagnosis Difficulty walking   PT Problem List Decreased balance;Decreased mobility  PT Treatment Interventions DME instruction;Gait training;Stair training;Functional mobility training;Therapeutic activities;Therapeutic exercise;Balance training;Patient/family education   PT Goals (Current goals can be found in the Care Plan section) Acute Rehab PT Goals Patient Stated Goal: to improve my balance PT Goal Formulation: With patient Time For Goal Achievement: 10/04/15 Potential to Achieve Goals: Good    Frequency 7X/week (pending MRI results)   Barriers to discharge        Co-evaluation               End of Session Equipment Utilized During Treatment: Gait belt Activity Tolerance: Patient tolerated treatment well Patient left: in chair;with call bell/phone within reach;with chair alarm set Nurse Communication: Mobility status;Precautions         Time: [id number] PT Time Calculation (min) (ACUTE ONLY): 17 min   Charges:   PT Evaluation $Initial PT Evaluation Tier I: 1 Procedure     PT G CodesLeitha Mullins Sep 27, 2015, 10:22 AM Diane Mullins, [place]

## 2015-09-20 NOTE — Care Management (Addendum)
Admitted to [facility] with the diagnosis of TIA. Lives alone in a house. Daughter is Debby Bud [phone] or [phone]). Last seen Dr. Biagio Quint 08/01/15. Last fall was 07/24/15. [employer] in the home. No home health. No skilled facility. No home oxygen. Uses rollator walker. Takes care of all basic and instrumental activities of daily living herself, drives.  Physical therapy evaluation completed. Recommending home health/physical therapy and youth walker.  States she would like the Home Health agency where her granddaughter works. Will call granddaughter to get name of agency. Name of agency is [place]. Will fax orders to [place]. Will get walker from [place].  Also, her insurance is [employer] now, not [employer]. Will ask daughter to bring card so it can be scanned into hospital system. Shelbie Ammons RN MSN CCM Care Management [phone]

## 2015-09-21 ENCOUNTER — Telehealth: Payer: Self-pay

## 2015-09-21 NOTE — Telephone Encounter (Signed)
Transition Care Management Follow-up Telephone Call   Date discharged? 1/2/6   How have you been since you were released from the hospital?  I rested extremely well!  I am hungry and I am able to focus much better.  No pain. No shortness of breath.  Still some weakness, but nothing out of the norm.  I can't locate my glucometer, but I will call my family to help me find it after I eat.     Do you understand why you were in the hospital? Yes, I was having trouble focusing and talking.  TIA.   Do you understand the discharge instructions? Yes, and physical therapy is going to come this week and I am to see neurology.   Where were you discharged to? Home.   Items Reviewed:  Medications reviewed: Yes, and now taking aspirin 325mg .  Continuing all other scheduled medications with no problems.  Allergies reviewed: Yes, no changes.  Dietary changes reviewed: Yes, continuing the ADA diet and low sodium.  Referrals reviewed: Yes, follow ups made with PCP, Neurology and PT per her.   Functional Questionnaire:   Activities of Daily Living (ADLs):   She states they are independent in the following: Independent in all ADLs except ambulating. States they require assistance with the following: Ambulating (Uses walker).   Any transportation issues/concerns?: None.  Family will bring to appointment.   Any patient concerns? Concerns of continued use of 325mg  aspirin and may need another glucometer machine if family cannot locate.   Confirmed importance and date/time of follow-up visits scheduled Yes, appointment scheduled on 09/26/14 at 1:00pm.  Provider Appointment booked with Dr. Gilford Rile (PCP).  Confirmed with patient if condition begins to worsen call PCP or go to the ER.  Patient was given the office number and encouraged to call back with question or concerns.  : Yes, patient verbalized understanding.

## 2015-09-23 ENCOUNTER — Telehealth: Payer: Self-pay | Admitting: *Deleted

## 2015-09-23 DIAGNOSIS — W19XXXD Unspecified fall, subsequent encounter: Secondary | ICD-10-CM | POA: Diagnosis not present

## 2015-09-23 DIAGNOSIS — Z9181 History of falling: Secondary | ICD-10-CM | POA: Diagnosis not present

## 2015-09-23 DIAGNOSIS — S46001D Unspecified injury of muscle(s) and tendon(s) of the rotator cuff of right shoulder, subsequent encounter: Secondary | ICD-10-CM | POA: Diagnosis not present

## 2015-09-23 DIAGNOSIS — Z794 Long term (current) use of insulin: Secondary | ICD-10-CM | POA: Diagnosis not present

## 2015-09-23 DIAGNOSIS — E119 Type 2 diabetes mellitus without complications: Secondary | ICD-10-CM | POA: Diagnosis not present

## 2015-09-23 DIAGNOSIS — I10 Essential (primary) hypertension: Secondary | ICD-10-CM | POA: Diagnosis not present

## 2015-09-23 DIAGNOSIS — I69353 Hemiplegia and hemiparesis following cerebral infarction affecting right non-dominant side: Secondary | ICD-10-CM | POA: Diagnosis not present

## 2015-09-23 DIAGNOSIS — E785 Hyperlipidemia, unspecified: Secondary | ICD-10-CM | POA: Diagnosis not present

## 2015-09-23 DIAGNOSIS — Z7982 Long term (current) use of aspirin: Secondary | ICD-10-CM | POA: Diagnosis not present

## 2015-09-23 NOTE — Telephone Encounter (Signed)
Attempted to call back, got after hours service will call tomorrow am. Thanks

## 2015-09-23 NOTE — Telephone Encounter (Signed)
Diane Mullins has requested to have Nursing services added on to her physical therapy, that she's receiving. Diane Mullins stated that the family had concerns about her medication dispensing. Patient started her Physical therapy today in home.

## 2015-09-23 NOTE — Telephone Encounter (Signed)
Please advise. Thanks.  

## 2015-09-23 NOTE — Telephone Encounter (Signed)
Fine to add nursing services.

## 2015-09-24 ENCOUNTER — Telehealth: Payer: Self-pay | Admitting: *Deleted

## 2015-09-24 NOTE — Telephone Encounter (Signed)
Verbal order given  

## 2015-09-24 NOTE — Telephone Encounter (Signed)
Called and spoke with Jenny Reichmann, verbal order given.

## 2015-09-24 NOTE — Telephone Encounter (Signed)
Diane Mullins has requested a verbal order for physical therapy, 2 week 1 3 week 2 and 2 week 3. Please advise  Contact Mitzel Mirante [phone]

## 2015-09-27 ENCOUNTER — Ambulatory Visit: Payer: Commercial Managed Care - HMO | Admitting: Internal Medicine

## 2015-09-28 DIAGNOSIS — W19XXXD Unspecified fall, subsequent encounter: Secondary | ICD-10-CM | POA: Diagnosis not present

## 2015-09-28 DIAGNOSIS — Z7982 Long term (current) use of aspirin: Secondary | ICD-10-CM | POA: Diagnosis not present

## 2015-09-28 DIAGNOSIS — S46001D Unspecified injury of muscle(s) and tendon(s) of the rotator cuff of right shoulder, subsequent encounter: Secondary | ICD-10-CM | POA: Diagnosis not present

## 2015-09-28 DIAGNOSIS — I10 Essential (primary) hypertension: Secondary | ICD-10-CM | POA: Diagnosis not present

## 2015-09-28 DIAGNOSIS — Z9181 History of falling: Secondary | ICD-10-CM | POA: Diagnosis not present

## 2015-09-28 DIAGNOSIS — E785 Hyperlipidemia, unspecified: Secondary | ICD-10-CM | POA: Diagnosis not present

## 2015-09-28 DIAGNOSIS — I69353 Hemiplegia and hemiparesis following cerebral infarction affecting right non-dominant side: Secondary | ICD-10-CM | POA: Diagnosis not present

## 2015-09-28 DIAGNOSIS — E119 Type 2 diabetes mellitus without complications: Secondary | ICD-10-CM | POA: Diagnosis not present

## 2015-09-28 DIAGNOSIS — Z794 Long term (current) use of insulin: Secondary | ICD-10-CM | POA: Diagnosis not present

## 2015-10-04 ENCOUNTER — Encounter: Payer: Self-pay | Admitting: Internal Medicine

## 2015-10-04 ENCOUNTER — Ambulatory Visit (INDEPENDENT_AMBULATORY_CARE_PROVIDER_SITE_OTHER): Payer: PPO | Admitting: Internal Medicine

## 2015-10-04 VITALS — BP 119/76 | HR 72 | Temp 97.7°F | Ht 61.0 in | Wt 204.5 lb

## 2015-10-04 DIAGNOSIS — IMO0001 Reserved for inherently not codable concepts without codable children: Secondary | ICD-10-CM

## 2015-10-04 DIAGNOSIS — I10 Essential (primary) hypertension: Secondary | ICD-10-CM

## 2015-10-04 DIAGNOSIS — G451 Carotid artery syndrome (hemispheric): Secondary | ICD-10-CM | POA: Diagnosis not present

## 2015-10-04 DIAGNOSIS — Z794 Long term (current) use of insulin: Secondary | ICD-10-CM

## 2015-10-04 DIAGNOSIS — E1165 Type 2 diabetes mellitus with hyperglycemia: Secondary | ICD-10-CM

## 2015-10-04 MED ORDER — LOSARTAN POTASSIUM 50 MG PO TABS: 50.0000 mg | ORAL_TABLET | Freq: Every day | ORAL | Status: DC

## 2015-10-04 MED ORDER — METOPROLOL SUCCINATE ER 100 MG PO TB24: 100.0000 mg | ORAL_TABLET | Freq: Every day | ORAL | Status: DC

## 2015-10-04 MED ORDER — METFORMIN HCL 1000 MG PO TABS: ORAL_TABLET | ORAL | Status: DC

## 2015-10-04 MED ORDER — LEVOTHYROXINE SODIUM 75 MCG PO TABS
75.0000 ug | ORAL_TABLET | Freq: Every day | ORAL | Status: DC
Start: 2015-10-04 — End: 2016-03-24

## 2015-10-04 MED ORDER — SIMVASTATIN 20 MG PO TABS: 20.0000 mg | ORAL_TABLET | Freq: Every evening | ORAL | Status: DC

## 2015-10-04 MED ORDER — OMEPRAZOLE 20 MG PO CPDR: 20.0000 mg | DELAYED_RELEASE_CAPSULE | Freq: Every day | ORAL | Status: DC

## 2015-10-04 MED ORDER — GLIMEPIRIDE 4 MG PO TABS: ORAL_TABLET | ORAL | Status: DC

## 2015-10-04 MED ORDER — ACCU-CHEK SOFT TOUCH LANCETS MISC: Status: DC

## 2015-10-04 NOTE — Progress Notes (Signed)
Subjective:    Patient ID: Diane Mullins, female    DOB: [birth year], 82 y.o.   MRN: [id number]  HPI  82YO female presents for hospital follow up.  ADMISSION - 09/18/2015 DISCHARGE - 09/20/2015  DIAGNOSIS - TIA, right arm paresthesia  Awoke Saturday morning with numbness in right hand and slurred speech. Went to ED by car. Diane Mullins was admitted for evaluation of CVA.  CT Head was normal. MRI brain showed no acute infarct, however intracranial atherosclerosis was seen. Carotid doppler showed no significant stenosis. ECHO was normal.  Right hand numbness has improved. Slurred speech has resolved. Feels like speech is slower though. Feels like balance is off. Doing PT 3 days per week. Feels safe at home. Daughter prepared some food for her. Driving some. Using walker and cane for balance.  DM - BG have been much better. Fasting 100s-110s. Compliant with medication.  HCTZ was stopped in 07/2015.   Wt Readings from Last 3 Encounters:  10/04/15 204 lb 8 oz (92.761 kg)  09/18/15 207 lb (93.895 kg)  08/19/15 207 lb (93.895 kg)   BP Readings from Last 3 Encounters:  10/04/15 119/76  09/20/15 153/57  08/19/15 138/72    Past Medical History  Diagnosis Date  . Hyperlipidemia   . Hypertension   . Diabetes mellitus   . Thyroid disease   . H/O: rheumatic fever    Family History  Problem Relation Age of Onset  . Heart disease Mother 60  . Heart attack Mother 59  . Cancer Father     colon  . Diabetes Sister   . Hypertension Sister   . Cancer Brother     kidney  . Diabetes Brother    Past Surgical History  Procedure Laterality Date  . Vaginal delivery      x4  . Abdominal hysterectomy  1973    menorrhagia  . Hernia repair  1994   Social History   Social History  . Marital Status: Widowed    Spouse Name: N/A  . Number of Children: 4  . Years of Education: N/A   Social History Main Topics  . Smoking status: Never Smoker   . Smokeless tobacco: Never Used    . Alcohol Use: No  . Drug Use: No  . Sexual Activity: No   Other Topics Concern  . None   Social History Narrative   Lives in [place] alone. No pets. Retired from [employer].   Diet: healthy   Exercise: water aerobics twice weekly, balance class   Ambulates with a walker at baseline    Review of Systems  Constitutional: Positive for fatigue. Negative for fever, chills, appetite change and unexpected weight change.  Eyes: Negative for visual disturbance.  Respiratory: Negative for shortness of breath.   Cardiovascular: Negative for chest pain and leg swelling.  Gastrointestinal: Negative for nausea, vomiting, abdominal pain, diarrhea and constipation.  Musculoskeletal: Positive for myalgias, arthralgias and gait problem.  Skin: Negative for color change and rash.  Neurological: Negative for dizziness, weakness, light-headedness and numbness.  Hematological: Negative for adenopathy. Does not bruise/bleed easily.  Psychiatric/Behavioral: Negative for suicidal ideas, sleep disturbance and dysphoric mood. The patient is not nervous/anxious.        Objective:    BP 119/76 mmHg  Pulse 72  Temp(Src) 97.7 F (36.5 C) (Oral)  Ht 5\' 1"  (1.549 m)  Wt 204 lb 8 oz (92.761 kg)  BMI 38.66 kg/m2  SpO2 96% Physical Exam  Constitutional: She is oriented to person,  place, and time. She appears well-developed and well-nourished. No distress.  HENT:  Head: Normocephalic and atraumatic.  Right Ear: External ear normal.  Left Ear: External ear normal.  Nose: Nose normal.  Mouth/Throat: Oropharynx is clear and moist. No oropharyngeal exudate.  Eyes: Conjunctivae are normal. Pupils are equal, round, and reactive to light. Right eye exhibits no discharge. Left eye exhibits no discharge. No scleral icterus.  Neck: Normal range of motion. Neck supple. No tracheal deviation present. No thyromegaly present.  Cardiovascular: Normal rate, regular rhythm, normal heart sounds and intact distal pulses.  Exam  reveals no gallop and no friction rub.   No murmur heard. Pulmonary/Chest: Effort normal and breath sounds normal. No respiratory distress. She has no wheezes. She has no rales. She exhibits no tenderness.  Musculoskeletal: Normal range of motion. She exhibits no edema or tenderness.  Lymphadenopathy:    She has no cervical adenopathy.  Neurological: She is alert and oriented to person, place, and time. No cranial nerve deficit or sensory deficit. She exhibits normal muscle tone. Coordination and gait normal.  Walks with a cane  Skin: Skin is warm and dry. No rash noted. She is not diaphoretic. No erythema. No pallor.  Psychiatric: She has a normal mood and affect. Her behavior is normal. Judgment and thought content normal.          Assessment & Plan:   Problem List Items Addressed This Visit      Unprioritized   Hypertension    BP Readings from Last 3 Encounters:  10/04/15 119/76  09/20/15 153/57  08/19/15 138/72   BP well controlled. Continue Metoprolol and Losartan.      Relevant Medications   simvastatin (ZOCOR) 20 MG tablet   metoprolol succinate (TOPROL-XL) 100 MG 24 hr tablet   losartan (COZAAR) 50 MG tablet   TIA (transient ischemic attack) - Primary    Reviewed hospital notes. Symptoms most c/w TIA. MRI brain showed no acute CVA. ECHO was normal. Carotid dopplers normal. Pt also reports she had a Holter monitor, however results not available. Will continue Aspirin 325mg  daily. Will continue PT. Set up neurology evaluation. Follow up in 2 weeks.      Relevant Medications   simvastatin (ZOCOR) 20 MG tablet   metoprolol succinate (TOPROL-XL) 100 MG 24 hr tablet   losartan (COZAAR) 50 MG tablet   Other Relevant Orders   Ambulatory referral to Neurology   Type 2 diabetes mellitus, uncontrolled ([place])    BG improved by report. Will continue current medications. Check A1c in 12/2014.      Relevant Medications   simvastatin (ZOCOR) 20 MG tablet   metFORMIN  (GLUCOPHAGE) 1000 MG tablet   losartan (COZAAR) 50 MG tablet   glimepiride (AMARYL) 4 MG tablet       Return in about 2 weeks (around 10/18/2015) for Recheck.

## 2015-10-04 NOTE — Assessment & Plan Note (Signed)
BG improved by report. Will continue current medications. Check A1c in 12/2014.

## 2015-10-04 NOTE — Assessment & Plan Note (Signed)
Reviewed hospital notes. Symptoms most c/w TIA. MRI brain showed no acute CVA. ECHO was normal. Carotid dopplers normal. Pt also reports she had a Holter monitor, however results not available. Will continue Aspirin 325mg  daily. Will continue PT. Set up neurology evaluation. Follow up in 2 weeks.

## 2015-10-04 NOTE — Assessment & Plan Note (Signed)
BP Readings from Last 3 Encounters:  10/04/15 119/76  09/20/15 153/57  08/19/15 138/72   BP well controlled. Continue Metoprolol and Losartan.

## 2015-10-04 NOTE — Patient Instructions (Addendum)
Continue current medications. However you can hold Furosemide and take as needed for increased swelling.  Continue physical therapy.  We will set up evaluation with neurology.  Follow up in 2 weeks.

## 2015-10-04 NOTE — Progress Notes (Signed)
Pre visit review using our clinic review tool, if applicable. No additional management support is needed unless otherwise documented below in the visit note. 

## 2015-10-05 DIAGNOSIS — E119 Type 2 diabetes mellitus without complications: Secondary | ICD-10-CM | POA: Diagnosis not present

## 2015-10-05 DIAGNOSIS — Z7982 Long term (current) use of aspirin: Secondary | ICD-10-CM | POA: Diagnosis not present

## 2015-10-05 DIAGNOSIS — W19XXXD Unspecified fall, subsequent encounter: Secondary | ICD-10-CM | POA: Diagnosis not present

## 2015-10-05 DIAGNOSIS — S46001D Unspecified injury of muscle(s) and tendon(s) of the rotator cuff of right shoulder, subsequent encounter: Secondary | ICD-10-CM | POA: Diagnosis not present

## 2015-10-05 DIAGNOSIS — I69353 Hemiplegia and hemiparesis following cerebral infarction affecting right non-dominant side: Secondary | ICD-10-CM | POA: Diagnosis not present

## 2015-10-05 DIAGNOSIS — Z794 Long term (current) use of insulin: Secondary | ICD-10-CM | POA: Diagnosis not present

## 2015-10-05 DIAGNOSIS — Z9181 History of falling: Secondary | ICD-10-CM | POA: Diagnosis not present

## 2015-10-05 DIAGNOSIS — I10 Essential (primary) hypertension: Secondary | ICD-10-CM | POA: Diagnosis not present

## 2015-10-05 DIAGNOSIS — E785 Hyperlipidemia, unspecified: Secondary | ICD-10-CM | POA: Diagnosis not present

## 2015-10-11 ENCOUNTER — Other Ambulatory Visit (INDEPENDENT_AMBULATORY_CARE_PROVIDER_SITE_OTHER): Payer: PPO

## 2015-10-11 ENCOUNTER — Encounter: Payer: Self-pay | Admitting: *Deleted

## 2015-10-11 DIAGNOSIS — Z1211 Encounter for screening for malignant neoplasm of colon: Secondary | ICD-10-CM | POA: Diagnosis not present

## 2015-10-11 LAB — FECAL OCCULT BLOOD, IMMUNOCHEMICAL: Fecal Occult Bld: NEGATIVE

## 2015-10-13 DIAGNOSIS — S46001D Unspecified injury of muscle(s) and tendon(s) of the rotator cuff of right shoulder, subsequent encounter: Secondary | ICD-10-CM | POA: Diagnosis not present

## 2015-10-13 DIAGNOSIS — E119 Type 2 diabetes mellitus without complications: Secondary | ICD-10-CM | POA: Diagnosis not present

## 2015-10-13 DIAGNOSIS — W19XXXD Unspecified fall, subsequent encounter: Secondary | ICD-10-CM | POA: Diagnosis not present

## 2015-10-13 DIAGNOSIS — E785 Hyperlipidemia, unspecified: Secondary | ICD-10-CM | POA: Diagnosis not present

## 2015-10-13 DIAGNOSIS — Z7982 Long term (current) use of aspirin: Secondary | ICD-10-CM | POA: Diagnosis not present

## 2015-10-13 DIAGNOSIS — I69353 Hemiplegia and hemiparesis following cerebral infarction affecting right non-dominant side: Secondary | ICD-10-CM | POA: Diagnosis not present

## 2015-10-13 DIAGNOSIS — Z794 Long term (current) use of insulin: Secondary | ICD-10-CM | POA: Diagnosis not present

## 2015-10-13 DIAGNOSIS — I10 Essential (primary) hypertension: Secondary | ICD-10-CM | POA: Diagnosis not present

## 2015-10-13 DIAGNOSIS — Z9181 History of falling: Secondary | ICD-10-CM | POA: Diagnosis not present

## 2015-10-14 DIAGNOSIS — Z794 Long term (current) use of insulin: Secondary | ICD-10-CM | POA: Diagnosis not present

## 2015-10-14 DIAGNOSIS — Z9181 History of falling: Secondary | ICD-10-CM | POA: Diagnosis not present

## 2015-10-14 DIAGNOSIS — Z7982 Long term (current) use of aspirin: Secondary | ICD-10-CM | POA: Diagnosis not present

## 2015-10-14 DIAGNOSIS — I69353 Hemiplegia and hemiparesis following cerebral infarction affecting right non-dominant side: Secondary | ICD-10-CM | POA: Diagnosis not present

## 2015-10-14 DIAGNOSIS — E119 Type 2 diabetes mellitus without complications: Secondary | ICD-10-CM | POA: Diagnosis not present

## 2015-10-14 DIAGNOSIS — S46001D Unspecified injury of muscle(s) and tendon(s) of the rotator cuff of right shoulder, subsequent encounter: Secondary | ICD-10-CM | POA: Diagnosis not present

## 2015-10-14 DIAGNOSIS — I10 Essential (primary) hypertension: Secondary | ICD-10-CM | POA: Diagnosis not present

## 2015-10-14 DIAGNOSIS — W19XXXD Unspecified fall, subsequent encounter: Secondary | ICD-10-CM | POA: Diagnosis not present

## 2015-10-14 DIAGNOSIS — E785 Hyperlipidemia, unspecified: Secondary | ICD-10-CM | POA: Diagnosis not present

## 2015-10-18 ENCOUNTER — Ambulatory Visit (INDEPENDENT_AMBULATORY_CARE_PROVIDER_SITE_OTHER): Payer: PPO | Admitting: Internal Medicine

## 2015-10-18 ENCOUNTER — Encounter: Payer: Commercial Managed Care - HMO | Admitting: Internal Medicine

## 2015-10-18 ENCOUNTER — Encounter: Payer: Self-pay | Admitting: Internal Medicine

## 2015-10-18 VITALS — BP 105/65 | HR 75 | Temp 98.3°F | Ht 61.0 in | Wt 207.4 lb

## 2015-10-18 DIAGNOSIS — G451 Carotid artery syndrome (hemispheric): Secondary | ICD-10-CM | POA: Diagnosis not present

## 2015-10-18 DIAGNOSIS — E1165 Type 2 diabetes mellitus with hyperglycemia: Secondary | ICD-10-CM | POA: Diagnosis not present

## 2015-10-18 DIAGNOSIS — R5382 Chronic fatigue, unspecified: Secondary | ICD-10-CM

## 2015-10-18 DIAGNOSIS — I10 Essential (primary) hypertension: Secondary | ICD-10-CM | POA: Diagnosis not present

## 2015-10-18 DIAGNOSIS — Z794 Long term (current) use of insulin: Secondary | ICD-10-CM

## 2015-10-18 DIAGNOSIS — R5383 Other fatigue: Secondary | ICD-10-CM | POA: Insufficient documentation

## 2015-10-18 DIAGNOSIS — IMO0001 Reserved for inherently not codable concepts without codable children: Secondary | ICD-10-CM

## 2015-10-18 LAB — CBC WITH DIFFERENTIAL/PLATELET
BASOS PCT: 0.7 % (ref 0.0–3.0)
Basophils Absolute: 0.1 10*3/uL (ref 0.0–0.1)
EOS PCT: 2.5 % (ref 0.0–5.0)
Eosinophils Absolute: 0.2 10*3/uL (ref 0.0–0.7)
HEMATOCRIT: 38 % (ref 36.0–46.0)
HEMOGLOBIN: 12.2 g/dL (ref 12.0–15.0)
LYMPHS PCT: 32.5 % (ref 12.0–46.0)
Lymphs Abs: 2.9 10*3/uL (ref 0.7–4.0)
MCHC: 32 g/dL (ref 30.0–36.0)
MCV: 87.7 fl (ref 78.0–100.0)
MONO ABS: 0.6 10*3/uL (ref 0.1–1.0)
Monocytes Relative: 7.1 % (ref 3.0–12.0)
Neutro Abs: 5.2 10*3/uL (ref 1.4–7.7)
Neutrophils Relative %: 57.2 % (ref 43.0–77.0)
Platelets: 290 10*3/uL (ref 150.0–400.0)
RBC: 4.33 Mil/uL (ref 3.87–5.11)
RDW: 15.2 % (ref 11.5–15.5)
WBC: 9.1 10*3/uL (ref 4.0–10.5)

## 2015-10-18 LAB — TSH: TSH: 2.41 u[IU]/mL (ref 0.35–4.50)

## 2015-10-18 MED ORDER — LOSARTAN POTASSIUM 50 MG PO TABS: 50.0000 mg | ORAL_TABLET | Freq: Every day | ORAL | Status: DC

## 2015-10-18 NOTE — Assessment & Plan Note (Signed)
Continues with PT at home. Continue Aspirin. Neurology evaluation pending.

## 2015-10-18 NOTE — Assessment & Plan Note (Signed)
BG well controlled by report. Plan to repeat A1c at end of March. Continue current medication.

## 2015-10-18 NOTE — Assessment & Plan Note (Signed)
Chronic fatigue. No focal symptoms or exam findings. Will repeat CBC and CMP with labs today. Follow up in 4 weeks.

## 2015-10-18 NOTE — Assessment & Plan Note (Signed)
BP Readings from Last 3 Encounters:  10/18/15 105/65  10/04/15 119/76  09/20/15 153/57   BP well controlled. Renal function with labs today.

## 2015-10-18 NOTE — Progress Notes (Signed)
Pre visit review using our clinic review tool, if applicable. No additional management support is needed unless otherwise documented below in the visit note. 

## 2015-10-18 NOTE — Progress Notes (Signed)
Subjective:    Patient ID: Diane Mullins, female    DOB: [birth year], 82 y.o.   MRN: [id number]  HPI 82YO female presents for follow up.  Recently seen after TIA.  Feeling exhausted generally, but no focal symptoms.   HTN - BP at home running 150s/80s. However she is using a smaller cuff. Compliant with medication.  DM - BG 80s.Compliant with medication. One BG that was 61. Few in 22s. At night, few BG over 200.  Continues with PT at home 3x per week. Has follow up scheduled with Dr. Manuella Ghazi. No numbness in right arm.  Wt Readings from Last 3 Encounters:  10/18/15 207 lb 6 oz (94.065 kg)  10/04/15 204 lb 8 oz (92.761 kg)  09/18/15 207 lb (93.895 kg)   BP Readings from Last 3 Encounters:  10/18/15 105/65  10/04/15 119/76  09/20/15 153/57    Past Medical History  Diagnosis Date  . Hyperlipidemia   . Hypertension   . Diabetes mellitus   . Thyroid disease   . H/O: rheumatic fever    Family History  Problem Relation Age of Onset  . Heart disease Mother 55  . Heart attack Mother 62  . Cancer Father     colon  . Diabetes Sister   . Hypertension Sister   . Cancer Brother     kidney  . Diabetes Brother    Past Surgical History  Procedure Laterality Date  . Vaginal delivery      x4  . Abdominal hysterectomy  1973    menorrhagia  . Hernia repair  1994   Social History   Social History  . Marital Status: Widowed    Spouse Name: N/A  . Number of Children: 4  . Years of Education: N/A   Social History Main Topics  . Smoking status: Never Smoker   . Smokeless tobacco: Never Used  . Alcohol Use: No  . Drug Use: No  . Sexual Activity: No   Other Topics Concern  . None   Social History Narrative   Lives in [place] alone. No pets. Retired from [employer].   Diet: healthy   Exercise: water aerobics twice weekly, balance class   Ambulates with a walker at baseline    Review of Systems  Constitutional: Positive for fatigue. Negative for fever, chills, appetite  change and unexpected weight change.  Eyes: Negative for visual disturbance.  Respiratory: Negative for shortness of breath.   Cardiovascular: Negative for chest pain and leg swelling.  Gastrointestinal: Negative for nausea, vomiting, abdominal pain, diarrhea and constipation.  Musculoskeletal: Positive for gait problem (unsteady, uses cane). Negative for myalgias and arthralgias.  Skin: Negative for color change and rash.  Neurological: Positive for weakness (right hand grip). Negative for numbness.  Hematological: Negative for adenopathy. Does not bruise/bleed easily.  Psychiatric/Behavioral: Negative for sleep disturbance and dysphoric mood. The patient is not nervous/anxious.        Objective:    BP 105/65 mmHg  Pulse 75  Temp(Src) 98.3 F (36.8 C) (Oral)  Ht 5\' 1"  (1.549 m)  Wt 207 lb 6 oz (94.065 kg)  BMI 39.20 kg/m2  SpO2 95% Physical Exam  Constitutional: She is oriented to person, place, and time. She appears well-developed and well-nourished. No distress.  HENT:  Head: Normocephalic and atraumatic.  Right Ear: External ear normal.  Left Ear: External ear normal.  Nose: Nose normal.  Mouth/Throat: Oropharynx is clear and moist. No oropharyngeal exudate.  Eyes: Conjunctivae are normal. Pupils are equal,  round, and reactive to light. Right eye exhibits no discharge. Left eye exhibits no discharge. No scleral icterus.  Neck: Normal range of motion. Neck supple. No tracheal deviation present. No thyromegaly present.  Cardiovascular: Normal rate, regular rhythm, normal heart sounds and intact distal pulses.  Exam reveals no gallop and no friction rub.   No murmur heard. Pulmonary/Chest: Effort normal and breath sounds normal. No respiratory distress. She has no wheezes. She has no rales. She exhibits no tenderness.  Musculoskeletal: Normal range of motion. She exhibits no edema or tenderness.  Lymphadenopathy:    She has no cervical adenopathy.  Neurological: She is alert  and oriented to person, place, and time. No cranial nerve deficit. She exhibits normal muscle tone. Coordination normal.  Skin: Skin is warm and dry. No rash noted. She is not diaphoretic. No erythema. No pallor.  Psychiatric: She has a normal mood and affect. Her behavior is normal. Judgment and thought content normal.          Assessment & Plan:   Problem List Items Addressed This Visit      Unprioritized   Fatigue - Primary    Chronic fatigue. No focal symptoms or exam findings. Will repeat CBC and CMP with labs today. Follow up in 4 weeks.      Relevant Orders   CBC with Differential/Platelet   TSH   Hypertension    BP Readings from Last 3 Encounters:  10/18/15 105/65  10/04/15 119/76  09/20/15 153/57   BP well controlled. Renal function with labs today.      Relevant Medications   losartan (COZAAR) 50 MG tablet   TIA (transient ischemic attack)    Continues with PT at home. Continue Aspirin. Neurology evaluation pending.      Relevant Medications   losartan (COZAAR) 50 MG tablet   Type 2 diabetes mellitus, uncontrolled ([place])    BG well controlled by report. Plan to repeat A1c at end of March. Continue current medication.      Relevant Medications   losartan (COZAAR) 50 MG tablet       Return in about 4 weeks (around 11/15/2015) for Physical.

## 2015-10-18 NOTE — Patient Instructions (Addendum)
Physical in March.  We will set up cologuard testing.

## 2015-10-19 DIAGNOSIS — E785 Hyperlipidemia, unspecified: Secondary | ICD-10-CM | POA: Diagnosis not present

## 2015-10-19 DIAGNOSIS — I69353 Hemiplegia and hemiparesis following cerebral infarction affecting right non-dominant side: Secondary | ICD-10-CM | POA: Diagnosis not present

## 2015-10-19 DIAGNOSIS — Z794 Long term (current) use of insulin: Secondary | ICD-10-CM | POA: Diagnosis not present

## 2015-10-19 DIAGNOSIS — Z9181 History of falling: Secondary | ICD-10-CM | POA: Diagnosis not present

## 2015-10-19 DIAGNOSIS — W19XXXD Unspecified fall, subsequent encounter: Secondary | ICD-10-CM | POA: Diagnosis not present

## 2015-10-19 DIAGNOSIS — S46001D Unspecified injury of muscle(s) and tendon(s) of the rotator cuff of right shoulder, subsequent encounter: Secondary | ICD-10-CM | POA: Diagnosis not present

## 2015-10-19 DIAGNOSIS — Z7982 Long term (current) use of aspirin: Secondary | ICD-10-CM | POA: Diagnosis not present

## 2015-10-19 DIAGNOSIS — I10 Essential (primary) hypertension: Secondary | ICD-10-CM | POA: Diagnosis not present

## 2015-10-19 DIAGNOSIS — E119 Type 2 diabetes mellitus without complications: Secondary | ICD-10-CM | POA: Diagnosis not present

## 2015-10-25 ENCOUNTER — Encounter: Payer: PPO | Admitting: Internal Medicine

## 2015-10-26 DIAGNOSIS — E119 Type 2 diabetes mellitus without complications: Secondary | ICD-10-CM | POA: Diagnosis not present

## 2015-10-26 DIAGNOSIS — E785 Hyperlipidemia, unspecified: Secondary | ICD-10-CM | POA: Diagnosis not present

## 2015-10-26 DIAGNOSIS — Z9181 History of falling: Secondary | ICD-10-CM | POA: Diagnosis not present

## 2015-10-26 DIAGNOSIS — S46001D Unspecified injury of muscle(s) and tendon(s) of the rotator cuff of right shoulder, subsequent encounter: Secondary | ICD-10-CM | POA: Diagnosis not present

## 2015-10-26 DIAGNOSIS — W19XXXD Unspecified fall, subsequent encounter: Secondary | ICD-10-CM | POA: Diagnosis not present

## 2015-10-26 DIAGNOSIS — Z7982 Long term (current) use of aspirin: Secondary | ICD-10-CM | POA: Diagnosis not present

## 2015-10-26 DIAGNOSIS — I69353 Hemiplegia and hemiparesis following cerebral infarction affecting right non-dominant side: Secondary | ICD-10-CM | POA: Diagnosis not present

## 2015-10-26 DIAGNOSIS — I10 Essential (primary) hypertension: Secondary | ICD-10-CM | POA: Diagnosis not present

## 2015-10-26 DIAGNOSIS — Z794 Long term (current) use of insulin: Secondary | ICD-10-CM | POA: Diagnosis not present

## 2015-10-28 ENCOUNTER — Emergency Department: Payer: PPO

## 2015-10-28 ENCOUNTER — Inpatient Hospital Stay
Admission: EM | Admit: 2015-10-28 | Discharge: 2015-11-01 | DRG: 494 | Disposition: A | Discharge status: Skilled Nursing Facility | Payer: PPO | Attending: Specialist | Admitting: Specialist

## 2015-10-28 ENCOUNTER — Inpatient Hospital Stay: Payer: PPO

## 2015-10-28 ENCOUNTER — Encounter: Payer: Self-pay | Admitting: Emergency Medicine

## 2015-10-28 DIAGNOSIS — E039 Hypothyroidism, unspecified: Secondary | ICD-10-CM | POA: Diagnosis not present

## 2015-10-28 DIAGNOSIS — Y93E1 Activity, personal bathing and showering: Secondary | ICD-10-CM

## 2015-10-28 DIAGNOSIS — W182XXA Fall in (into) shower or empty bathtub, initial encounter: Secondary | ICD-10-CM | POA: Diagnosis not present

## 2015-10-28 DIAGNOSIS — Z79899 Other long term (current) drug therapy: Secondary | ICD-10-CM | POA: Diagnosis not present

## 2015-10-28 DIAGNOSIS — Z833 Family history of diabetes mellitus: Secondary | ICD-10-CM

## 2015-10-28 DIAGNOSIS — Z9181 History of falling: Secondary | ICD-10-CM | POA: Diagnosis not present

## 2015-10-28 DIAGNOSIS — Z800 Family history of malignant neoplasm of digestive organs: Secondary | ICD-10-CM

## 2015-10-28 DIAGNOSIS — W19XXXA Unspecified fall, initial encounter: Secondary | ICD-10-CM | POA: Diagnosis not present

## 2015-10-28 DIAGNOSIS — S82891A Other fracture of right lower leg, initial encounter for closed fracture: Secondary | ICD-10-CM | POA: Diagnosis present

## 2015-10-28 DIAGNOSIS — Z8673 Personal history of transient ischemic attack (TIA), and cerebral infarction without residual deficits: Secondary | ICD-10-CM | POA: Diagnosis not present

## 2015-10-28 DIAGNOSIS — Z8249 Family history of ischemic heart disease and other diseases of the circulatory system: Secondary | ICD-10-CM

## 2015-10-28 DIAGNOSIS — K219 Gastro-esophageal reflux disease without esophagitis: Secondary | ICD-10-CM | POA: Diagnosis not present

## 2015-10-28 DIAGNOSIS — Z7982 Long term (current) use of aspirin: Secondary | ICD-10-CM

## 2015-10-28 DIAGNOSIS — M25571 Pain in right ankle and joints of right foot: Secondary | ICD-10-CM | POA: Diagnosis not present

## 2015-10-28 DIAGNOSIS — S82841A Displaced bimalleolar fracture of right lower leg, initial encounter for closed fracture: Secondary | ICD-10-CM | POA: Diagnosis not present

## 2015-10-28 DIAGNOSIS — F4322 Adjustment disorder with anxiety: Secondary | ICD-10-CM

## 2015-10-28 DIAGNOSIS — E119 Type 2 diabetes mellitus without complications: Secondary | ICD-10-CM | POA: Diagnosis present

## 2015-10-28 DIAGNOSIS — I10 Essential (primary) hypertension: Secondary | ICD-10-CM | POA: Diagnosis present

## 2015-10-28 DIAGNOSIS — Z01810 Encounter for preprocedural cardiovascular examination: Secondary | ICD-10-CM | POA: Diagnosis not present

## 2015-10-28 DIAGNOSIS — Z9889 Other specified postprocedural states: Secondary | ICD-10-CM | POA: Diagnosis not present

## 2015-10-28 DIAGNOSIS — Z8051 Family history of malignant neoplasm of kidney: Secondary | ICD-10-CM | POA: Diagnosis not present

## 2015-10-28 DIAGNOSIS — M1990 Unspecified osteoarthritis, unspecified site: Secondary | ICD-10-CM | POA: Diagnosis present

## 2015-10-28 DIAGNOSIS — Z90710 Acquired absence of both cervix and uterus: Secondary | ICD-10-CM

## 2015-10-28 DIAGNOSIS — E785 Hyperlipidemia, unspecified: Secondary | ICD-10-CM | POA: Diagnosis present

## 2015-10-28 DIAGNOSIS — M25561 Pain in right knee: Secondary | ICD-10-CM

## 2015-10-28 DIAGNOSIS — S82831A Other fracture of upper and lower end of right fibula, initial encounter for closed fracture: Secondary | ICD-10-CM | POA: Diagnosis not present

## 2015-10-28 DIAGNOSIS — R110 Nausea: Secondary | ICD-10-CM | POA: Diagnosis not present

## 2015-10-28 HISTORY — DX: Cerebral infarction, unspecified: I63.9

## 2015-10-28 LAB — CBC WITH DIFFERENTIAL/PLATELET
BASOS ABS: 0.1 10*3/uL (ref 0–0.1)
Basophils Relative: 1 %
EOS PCT: 2 %
Eosinophils Absolute: 0.2 10*3/uL (ref 0–0.7)
HCT: 36.2 % (ref 35.0–47.0)
Hemoglobin: 11.9 g/dL — ABNORMAL LOW (ref 12.0–16.0)
LYMPHS PCT: 24 %
Lymphs Abs: 2.6 10*3/uL (ref 1.0–3.6)
MCH: 28.2 pg (ref 26.0–34.0)
MCHC: 32.8 g/dL (ref 32.0–36.0)
MCV: 86.1 fL (ref 80.0–100.0)
MONO ABS: 0.9 10*3/uL (ref 0.2–0.9)
MONOS PCT: 8 %
Neutro Abs: 7.3 10*3/uL — ABNORMAL HIGH (ref 1.4–6.5)
Neutrophils Relative %: 65 %
PLATELETS: 258 10*3/uL (ref 150–440)
RBC: 4.2 MIL/uL (ref 3.80–5.20)
RDW: 14.9 % — AB (ref 11.5–14.5)
WBC: 11.1 10*3/uL — ABNORMAL HIGH (ref 3.6–11.0)

## 2015-10-28 LAB — PROTIME-INR
INR: 0.95
Prothrombin Time: 12.9 seconds (ref 11.4–15.0)

## 2015-10-28 LAB — BASIC METABOLIC PANEL
ANION GAP: 9 (ref 5–15)
BUN: 15 mg/dL (ref 6–20)
CALCIUM: 9.2 mg/dL (ref 8.9–10.3)
CO2: 26 mmol/L (ref 22–32)
CREATININE: 1.02 mg/dL — AB (ref 0.44–1.00)
Chloride: 103 mmol/L (ref 101–111)
GFR calc Af Amer: 58 mL/min — ABNORMAL LOW (ref >60)
GFR, EST NON AFRICAN AMERICAN: 50 mL/min — AB (ref >60)
GLUCOSE: 187 mg/dL — AB (ref 65–99)
Potassium: 4.6 mmol/L (ref 3.5–5.1)
Sodium: 138 mmol/L (ref 135–145)

## 2015-10-28 LAB — APTT: APTT: 26 s (ref 24–36)

## 2015-10-28 LAB — SURGICAL PCR SCREEN
MRSA, PCR: NEGATIVE
STAPHYLOCOCCUS AUREUS: NEGATIVE

## 2015-10-28 LAB — GLUCOSE, CAPILLARY
GLUCOSE-CAPILLARY: 170 mg/dL — AB (ref 65–99)
Glucose-Capillary: 200 mg/dL — ABNORMAL HIGH (ref 65–99)

## 2015-10-28 MED ORDER — ONDANSETRON HCL 4 MG/2ML IJ SOLN
4.0000 mg | Freq: Once | INTRAMUSCULAR | Status: AC
Start: 2015-10-28 — End: 2015-10-28
  Administered 2015-10-28: 4 mg via INTRAVENOUS

## 2015-10-28 MED ORDER — VITAMIN D 1000 UNITS PO TABS
1000.0000 [IU] | ORAL_TABLET | Freq: Every day | ORAL | Status: DC
  Administered 2015-10-28 – 2015-11-01 (×4): 1000 [IU] via ORAL
  Filled 2015-10-28 (×5): qty 1

## 2015-10-28 MED ORDER — FUROSEMIDE 20 MG PO TABS
20.0000 mg | ORAL_TABLET | Freq: Every day | ORAL | Status: DC
  Administered 2015-10-30 – 2015-11-01 (×3): 20 mg via ORAL
  Filled 2015-10-28 (×6): qty 1

## 2015-10-28 MED ORDER — ONDANSETRON HCL 4 MG/2ML IJ SOLN
INTRAMUSCULAR | Status: AC
  Administered 2015-10-28: 4 mg via INTRAVENOUS
  Filled 2015-10-28: qty 2

## 2015-10-28 MED ORDER — MORPHINE SULFATE (PF) 4 MG/ML IV SOLN
INTRAVENOUS | Status: AC
  Administered 2015-10-28: 4 mg via INTRAVENOUS
  Filled 2015-10-28: qty 1

## 2015-10-28 MED ORDER — CALCIUM-VITAMIN D 250-125 MG-UNIT PO TABS
1.0000 | ORAL_TABLET | Freq: Every day | ORAL | Status: DC
Start: 2015-10-28 — End: 2015-10-29
  Filled 2015-10-28 (×2): qty 1

## 2015-10-28 MED ORDER — OMEGA-3 FATTY ACIDS 1000 MG PO CAPS
1.0000 g | ORAL_CAPSULE | Freq: Two times a day (BID) | ORAL | Status: DC
  Filled 2015-10-28 (×2): qty 1

## 2015-10-28 MED ORDER — OMEGA-3-ACID ETHYL ESTERS 1 G PO CAPS
1.0000 g | ORAL_CAPSULE | Freq: Two times a day (BID) | ORAL | Status: DC
  Administered 2015-10-28 – 2015-11-01 (×7): 1 g via ORAL
  Filled 2015-10-28 (×7): qty 1

## 2015-10-28 MED ORDER — LOSARTAN POTASSIUM 50 MG PO TABS
50.0000 mg | ORAL_TABLET | Freq: Every day | ORAL | Status: DC
  Administered 2015-10-28 – 2015-11-01 (×4): 50 mg via ORAL
  Filled 2015-10-28 (×4): qty 1

## 2015-10-28 MED ORDER — LIDOCAINE HCL (PF) 1 % IJ SOLN
INTRAMUSCULAR | Status: AC
  Filled 2015-10-28: qty 10

## 2015-10-28 MED ORDER — PANTOPRAZOLE SODIUM 40 MG PO TBEC
40.0000 mg | DELAYED_RELEASE_TABLET | Freq: Every day | ORAL | Status: DC
  Administered 2015-10-28 – 2015-11-01 (×4): 40 mg via ORAL
  Filled 2015-10-28 (×4): qty 1

## 2015-10-28 MED ORDER — OXYCODONE HCL 5 MG PO TABS
5.0000 mg | ORAL_TABLET | ORAL | Status: DC | PRN
  Administered 2015-10-28: 5 mg via ORAL
  Filled 2015-10-28: qty 1

## 2015-10-28 MED ORDER — METOPROLOL SUCCINATE ER 100 MG PO TB24
100.0000 mg | ORAL_TABLET | Freq: Every day | ORAL | Status: DC
  Administered 2015-10-28 – 2015-11-01 (×5): 100 mg via ORAL
  Filled 2015-10-28 (×5): qty 1

## 2015-10-28 MED ORDER — ALPRAZOLAM 0.25 MG PO TABS: 0.2500 mg | ORAL_TABLET | Freq: Three times a day (TID) | ORAL | Status: DC | PRN

## 2015-10-28 MED ORDER — SODIUM CHLORIDE 0.9 % IV BOLUS (SEPSIS)
500.0000 mL | Freq: Once | INTRAVENOUS | Status: AC
  Administered 2015-10-28: 500 mL via INTRAVENOUS

## 2015-10-28 MED ORDER — ACETAMINOPHEN 650 MG RE SUPP: 650.0000 mg | Freq: Four times a day (QID) | RECTAL | Status: DC | PRN

## 2015-10-28 MED ORDER — SODIUM CHLORIDE 0.9 % IV SOLN
INTRAVENOUS | Status: DC
  Administered 2015-10-28 – 2015-10-29 (×3): via INTRAVENOUS

## 2015-10-28 MED ORDER — SIMVASTATIN 20 MG PO TABS
20.0000 mg | ORAL_TABLET | Freq: Every evening | ORAL | Status: DC
  Administered 2015-10-28 – 2015-10-31 (×4): 20 mg via ORAL
  Filled 2015-10-28 (×4): qty 1

## 2015-10-28 MED ORDER — MORPHINE SULFATE (PF) 4 MG/ML IV SOLN
4.0000 mg | Freq: Once | INTRAVENOUS | Status: AC
  Administered 2015-10-28: 4 mg via INTRAVENOUS

## 2015-10-28 MED ORDER — INSULIN ASPART 100 UNIT/ML ~~LOC~~ SOLN: 0.0000 [IU] | Freq: Every day | SUBCUTANEOUS | Status: DC

## 2015-10-28 MED ORDER — ACETAMINOPHEN 325 MG PO TABS: 650.0000 mg | ORAL_TABLET | Freq: Four times a day (QID) | ORAL | Status: DC | PRN

## 2015-10-28 MED ORDER — ADULT MULTIVITAMIN W/MINERALS CH
1.0000 | ORAL_TABLET | Freq: Every day | ORAL | Status: DC
  Administered 2015-10-28 – 2015-11-01 (×4): 1 via ORAL
  Filled 2015-10-28 (×5): qty 1

## 2015-10-28 MED ORDER — INSULIN ASPART 100 UNIT/ML ~~LOC~~ SOLN
0.0000 [IU] | Freq: Three times a day (TID) | SUBCUTANEOUS | Status: DC
  Administered 2015-10-28: 2 [IU] via SUBCUTANEOUS
  Filled 2015-10-28: qty 3

## 2015-10-28 MED ORDER — ONDANSETRON HCL 4 MG PO TABS: 4.0000 mg | ORAL_TABLET | Freq: Four times a day (QID) | ORAL | Status: DC | PRN

## 2015-10-28 MED ORDER — BUPIVACAINE HCL (PF) 0.5 % IJ SOLN
INTRAMUSCULAR | Status: AC
  Filled 2015-10-28: qty 30

## 2015-10-28 MED ORDER — HYDROMORPHONE HCL 1 MG/ML IJ SOLN
1.0000 mg | INTRAMUSCULAR | Status: DC | PRN
  Administered 2015-10-28 – 2015-10-29 (×3): 1 mg via INTRAVENOUS
  Filled 2015-10-28 (×3): qty 1

## 2015-10-28 MED ORDER — LEVOTHYROXINE SODIUM 75 MCG PO TABS: 75.0000 ug | ORAL_TABLET | Freq: Every day | ORAL | Status: DC

## 2015-10-28 MED ORDER — ONDANSETRON HCL 4 MG/2ML IJ SOLN
4.0000 mg | Freq: Four times a day (QID) | INTRAMUSCULAR | Status: DC | PRN
  Administered 2015-10-28 – 2015-10-29 (×2): 4 mg via INTRAVENOUS
  Filled 2015-10-28: qty 2

## 2015-10-28 NOTE — H&P (Signed)
[facility] NAME: Diane Mullins    MR#:  [id number]  DATE OF BIRTH:  1932-11-08  DATE OF ADMISSION:  10/28/2015  PRIMARY CARE PHYSICIAN: Rica Mast, MD   REQUESTING/REFERRING PHYSICIAN: Dr. Darrick Penna  CHIEF COMPLAINT:   Chief Complaint  Patient presents with  . Ankle Pain    HISTORY OF PRESENT ILLNESS:  Diane Mullins  is a 82 y.o. female with a known history of hypertension, hyperlipidemia, diabetes, hypothyroidism who presents to the hospital after a mechanical fall and noted to have a right ankle fracture. Patient says she was trying to get out of the shower earlier today and slipped and fell on the bathroom floor. She started to have significant right ankle pain and therefore called her granddaughter who called EMS to bring him to the hospital. Patient on evaluation in the ER was noted to have a right ankle fracture. Orthopedics has evaluated the patient and recommended admission by the hospitalist services. Patient denies any prodromal symptoms prior to her fall like any chest pain, dizziness, shortness of breath, palpitations, seizures or any other associated symptoms.  PAST MEDICAL HISTORY:   Past Medical History  Diagnosis Date  . Hyperlipidemia   . Hypertension   . Diabetes mellitus   . Thyroid disease   . H/O: rheumatic fever     PAST SURGICAL HISTORY:   Past Surgical History  Procedure Laterality Date  . Vaginal delivery      x4  . Abdominal hysterectomy  1973    menorrhagia  . Hernia repair  1994    SOCIAL HISTORY:   Social History  Substance Use Topics  . Smoking status: Never Smoker   . Smokeless tobacco: Never Used  . Alcohol Use: No    FAMILY HISTORY:   Family History  Problem Relation Age of Onset  . Heart disease Mother 36  . Heart attack Mother 34  . Cancer Father     colon  . Diabetes Sister   . Hypertension Sister   . Cancer Brother     kidney  . Diabetes Brother      DRUG ALLERGIES:  No Known Allergies  REVIEW OF SYSTEMS:   Review of Systems  Constitutional: Negative for fever and weight loss.  HENT: Negative for congestion, nosebleeds and tinnitus.   Eyes: Negative for blurred vision, double vision and redness.  Respiratory: Negative for cough, hemoptysis and shortness of breath.   Cardiovascular: Negative for chest pain, orthopnea, leg swelling and PND.  Gastrointestinal: Negative for nausea, vomiting, abdominal pain, diarrhea and melena.  Genitourinary: Negative for dysuria, urgency and hematuria.  Musculoskeletal: Positive for joint pain (right ankle) and falls.  Neurological: Negative for dizziness, tingling, sensory change, focal weakness, seizures, weakness and headaches.  Endo/Heme/Allergies: Negative for polydipsia. Does not bruise/bleed easily.  Psychiatric/Behavioral: Negative for depression and memory loss. The patient is not nervous/anxious.     MEDICATIONS AT HOME:   Prior to Admission medications   Medication Sig Start Date End Date Taking? Authorizing Provider  ALPRAZolam (XANAX) 0.25 MG tablet Take 1 tablet (0.25 mg total) by mouth 3 (three) times daily as needed for sleep or anxiety. 06/17/15  Yes Jackolyn Confer, MD  aspirin 325 MG tablet Take 1 tablet (325 mg total) by mouth daily. 09/20/15  Yes Epifanio Lesches, MD  calcium-vitamin D (OSCAL) 250-125 MG-UNIT per tablet Take 1 tablet by mouth daily.   Yes Historical Provider, MD  cholecalciferol (VITAMIN D) 1000 units tablet  Take 1,000 Units by mouth daily.   Yes Historical Provider, MD  fish oil-omega-3 fatty acids 1000 MG capsule Take 1 g by mouth 2 (two) times daily.    Yes Historical Provider, MD  furosemide (LASIX) 20 MG tablet TAKE 1 TABLET EVERY DAY Patient taking differently: Take 10mg  by mouth every day 03/09/15  Yes Jackolyn Confer, MD  glimepiride (AMARYL) 4 MG tablet TAKE 1 TABLET EVERY MORNING  AND TAKE 1/2 TABLET EVERY EVENING 10/04/15  Yes Jackolyn Confer,  MD  LANTUS SOLOSTAR 100 UNIT/ML Solostar Pen INJECT  15 UNITS SUBCUTANEOUSLY IN THE MORNING 08/19/15  Yes Crecencio Mc, MD  levothyroxine (SYNTHROID, LEVOTHROID) 75 MCG tablet Take 1 tablet (75 mcg total) by mouth daily. 10/04/15  Yes Jackolyn Confer, MD  losartan (COZAAR) 50 MG tablet Take 1 tablet (50 mg total) by mouth daily. 10/18/15  Yes Jackolyn Confer, MD  metFORMIN (GLUCOPHAGE) 1000 MG tablet TAKE 1 TABLET TWICE DAILY  WITH  A  MEAL Patient taking differently: Take 1,000 mg by mouth 2 (two) times daily with a meal. TAKE 1 TABLET TWICE DAILY  WITH  A  MEAL 10/04/15  Yes Jackolyn Confer, MD  metoprolol succinate (TOPROL-XL) 100 MG 24 hr tablet Take 1 tablet (100 mg total) by mouth daily. 10/04/15  Yes Jackolyn Confer, MD  Multiple Vitamins-Minerals (MULTIVITAMIN WITH MINERALS) tablet Take 1 tablet by mouth daily.   Yes Historical Provider, MD  omeprazole (PRILOSEC) 20 MG capsule Take 1 capsule (20 mg total) by mouth daily. 10/04/15  Yes Jackolyn Confer, MD  simvastatin (ZOCOR) 20 MG tablet Take 1 tablet (20 mg total) by mouth every evening. 10/04/15  Yes Jackolyn Confer, MD      VITAL SIGNS:  Blood pressure 151/64, pulse 76, temperature 97.7 F (36.5 C), temperature source Oral, resp. rate 15, height 5\' 1"  (1.549 m), weight 91.173 kg (201 lb), SpO2 89 %.  PHYSICAL EXAMINATION:  Physical Exam  GENERAL:  82 y.o.-year-old patient lying in the bed with no acute distress.  EYES: Pupils equal, round, reactive to light and accommodation. No scleral icterus. Extraocular muscles intact.  HEENT: Head atraumatic, normocephalic. Oropharynx and nasopharynx clear. No oropharyngeal erythema, moist oral mucosa  NECK:  Supple, no jugular venous distention. No thyroid enlargement, no tenderness.  LUNGS: Normal breath sounds bilaterally, no wheezing, rales, rhonchi. No use of accessory muscles of respiration.  CARDIOVASCULAR: S1, S2 RRR. No murmurs, rubs, gallops, clicks.  ABDOMEN: Soft,  nontender, nondistended. Bowel sounds present. No organomegaly or mass.  EXTREMITIES: No pedal edema, cyanosis, or clubbing. + 2 pedal & radial pulses b/l.  Right ankle bruising noted.   NEUROLOGIC: Cranial nerves II through XII are intact. No focal Motor or sensory deficits appreciated b/l PSYCHIATRIC: The patient is alert and oriented x 3. Good affect.  SKIN: No obvious rash, lesion, or ulcer.   LABORATORY PANEL:   CBC  Recent Labs Lab 10/28/15 0954  WBC 11.1*  HGB 11.9*  HCT 36.2  PLT 258   ------------------------------------------------------------------------------------------------------------------  Chemistries   Recent Labs Lab 10/28/15 0954  NA 138  K 4.6  CL 103  CO2 26  GLUCOSE 187*  BUN 15  CREATININE 1.02*  CALCIUM 9.2   ------------------------------------------------------------------------------------------------------------------  Cardiac Enzymes No results for input(s): TROPONINI in the last 168 hours. ------------------------------------------------------------------------------------------------------------------  RADIOLOGY:  Dg Ankle Complete Right  10/28/2015  CLINICAL DATA:  Slipped and fell getting out of the shower. EXAM: RIGHT ANKLE - COMPLETE 3+ VIEW COMPARISON:  None. FINDINGS: There is an oblique displaced fracture of the distal fibular diaphysis with 1.4 cm of lateral displacement. There is a displaced medial malleolar fracture with 15 mm of lateral displacement. There is lateral subluxation of the talar dome relative to the tibial plafond with widening of the distal tibiofibular syndesmosis. There is generalized osteopenia. There is a small plantar calcaneal spur. There is generalized soft tissue swelling around the ankle. There is peripheral vascular atherosclerotic disease. IMPRESSION: Oblique displaced fracture of the distal fibular diaphysis with 1.4 cm of lateral displacement. Displaced medial malleolar fracture with 15 mm of lateral  displacement. Lateral subluxation of the talar dome relative to the tibial plafond with widening of the distal tibiofibular syndesmosis. Electronically Signed   By: Kathreen Devoid   On: 10/28/2015 10:25     IMPRESSION AND PLAN:   82 year old female with past medical history of hypertension, hyperlipidemia, diabetes, hypothyroidism, osteoarthritis who presents to the hospital after a mechanical fall and noted to have a right ankle fracture.   #1 preoperative medical evaluation-patient is a low to moderate risk for noncardiac surgery. -There are no absolute contraindications to surgery at this time. Patient's EKG has been reviewed and shows no acute ST or T-wave changes. -Continue perioperative beta blocker.  #2 right ankle fracture-continue care as per orthopedics. -Continue pain control with as needed dilaudid. Pt. Has been seen by Orthopedics and plan for surgery tomorrow.   #3 diabetes type 2 without complication-hold glimepiride, metformin. -This on sliding scale insulin.  #4 hypertension-continue losartan, metoprolol.  #5 hypothyroidism-continue Synthroid.  #6 hyperlipidemia-continue simvastatin.  #7 history of previous TIA-hold aspirin as patient will need surgery continue statin.   All the records are reviewed and case discussed with ED provider. Management plans discussed with the patient, family and they are in agreement.  CODE STATUS: Full  TOTAL TIME TAKING CARE OF THIS PATIENT: 45 minutes.    Henreitta Leber M.D on 10/28/2015 at 1:03 PM  Between 7am to 6pm - Pager - [phone]  After 6pm go to [email or web address] - password [email or web address] [facility]  Office  [phone]  CC: Primary care physician; Rica Mast, MD

## 2015-10-28 NOTE — ED Notes (Signed)
Admitting and ortho in room at this time

## 2015-10-28 NOTE — ED Provider Notes (Signed)
[facility] Emergency Department Provider Note  ____________________________________________  Time seen: Approximately 9:49 AM  I have reviewed the triage vital signs and the nursing notes.   HISTORY  Chief Complaint Ankle Pain    HPI Diane Mullins is a 82 y.o. female with history of hypertension, hyperlipidemia, diabetes, thyroid disease who presents  with traumatic right ankle pain that began suddenly just prior to arrival, constant since onset, worse with movement, currently severe. The patient reports that she was getting out of the shower when she tripped and fell, injuring her ankle. She did not hit her head or lose consciousness. She denies any other pain complaints. No lightheadedness, dizziness, chest pain, difficulty breathing. Prior to today, she had been in her usual state of health.   Past Medical History  Diagnosis Date  . Hyperlipidemia   . Hypertension   . Diabetes mellitus   . Thyroid disease   . H/O: rheumatic fever     Patient Active Problem List   Diagnosis Date Noted  . Closed right ankle fracture 10/28/2015  . Fatigue 10/18/2015  . TIA (transient ischemic attack) 09/18/2015  . Orthostatic hypotension 08/02/2015  . Right shoulder injury 08/02/2015  . Adjustment disorder with anxious mood 03/30/2015  . Benign paroxysmal positional vertigo 07/17/2014  . Postmenopausal estrogen deficiency 06/03/2014  . DNR (do not resuscitate) discussion 03/04/2014  . Gait disturbance 05/07/2013  . Type 2 diabetes mellitus, uncontrolled ([place]) 04/21/2013  . Tachycardia 01/23/2013  . Medicare annual wellness visit, subsequent 12/09/2012  . Exertional dyspnea 12/09/2012  . Hearing loss 12/09/2012  . Anxiety 01/31/2012  . Hypertension 12/22/2011  . Hyperlipidemia 12/22/2011  . Hypothyroidism 12/22/2011    Past Surgical History  Procedure Laterality Date  . Vaginal delivery      x4  . Abdominal hysterectomy  1973    menorrhagia  . Hernia  repair  1994    No current outpatient prescriptions on file.  Allergies Review of patient's allergies indicates no known allergies.  Family History  Problem Relation Age of Onset  . Heart disease Mother 25  . Heart attack Mother 75  . Cancer Father     colon  . Diabetes Sister   . Hypertension Sister   . Cancer Brother     kidney  . Diabetes Brother     Social History Social History  Substance Use Topics  . Smoking status: Never Smoker   . Smokeless tobacco: Never Used  . Alcohol Use: No    Review of Systems Constitutional: No fever/chills Eyes: No visual changes. ENT: No sore throat. Cardiovascular: Denies chest pain. Respiratory: Denies shortness of breath. Gastrointestinal: No abdominal pain.  No nausea, no vomiting.  No diarrhea.  No constipation. Genitourinary: Negative for dysuria. Musculoskeletal: Negative for back pain. Skin: Negative for rash. Neurological: Negative for headaches, focal weakness or numbness.  10-point ROS otherwise negative.  ____________________________________________   PHYSICAL EXAM:  Filed Vitals:   10/28/15 1400 10/28/15 1430 10/28/15 1503 10/28/15 1529  BP: 160/88 129/77 116/61   Pulse: 90 97 97   Temp:   98.2 F (36.8 C)   TempSrc:   Oral   Resp:      Height:   5\' 1"  (1.549 m)   Weight:   207 lb (93.895 kg)   SpO2: 92% 89% 87% 94%     Constitutional: Alert and oriented. Well appearing and in no acute distress. Eyes: Conjunctivae are normal. PERRL. EOMI. Head: Atraumatic. Nose: No congestion/rhinnorhea. Mouth/Throat: Mucous membranes are moist.  Oropharynx non-erythematous. Neck: No stridor.  No cervical spine tenderness to palpation. Cardiovascular: Normal rate, regular rhythm. Grossly normal heart sounds.  Good peripheral circulation. Respiratory: Normal respiratory effort.  No retractions. Lungs CTAB. Gastrointestinal: Soft and nontender. No distention. No CVA tenderness. Genitourinary:  deferred Musculoskeletal: Swelling with obvious bony deformity of the right ankle. 2+ right DP pulse, wiggles the toes.  Neurologic:  Normal speech and language. No gross focal neurologic deficits are appreciated. No gait instability. Skin:  Skin is warm, dry and intact. No rash noted. Psychiatric: Mood and affect are normal. Speech and behavior are normal.  ____________________________________________   LABS (all labs ordered are listed, but only abnormal results are displayed)  Labs Reviewed  CBC WITH DIFFERENTIAL/PLATELET - Abnormal; Notable for the following:    WBC 11.1 (*)    Hemoglobin 11.9 (*)    RDW 14.9 (*)    Neutro Abs 7.3 (*)    All other components within normal limits  BASIC METABOLIC PANEL - Abnormal; Notable for the following:    Glucose, Bld 187 (*)    Creatinine, Ser 1.02 (*)    GFR calc non Af Amer 50 (*)    GFR calc Af Amer 58 (*)    All other components within normal limits  SURGICAL PCR SCREEN  PROTIME-INR  APTT   ____________________________________________  EKG  ED ECG REPORT I, Joanne Gavel, the attending physician, personally viewed and interpreted this ECG.   Date: 10/28/2015  EKG Time: 09:50  Rate: 73  Rhythm: normal sinus rhythm  Axis: normal  Intervals:nonspecific interventricular conduction delay.  ST&T Change: No acute ST elevation  ____________________________________________  RADIOLOGY  Right ankle xray IMPRESSION: Oblique displaced fracture of the distal fibular diaphysis with 1.4 cm of lateral displacement. Displaced medial malleolar fracture with 15 mm of lateral displacement. Lateral subluxation of the talar dome relative to the tibial plafond with widening of the distal tibiofibular syndesmosis. ____________________________________________   PROCEDURES  Procedure(s) performed: None  Critical Care performed: No  ____________________________________________   INITIAL IMPRESSION / ASSESSMENT AND PLAN / ED  COURSE  Pertinent labs & imaging results that were available during my care of the patient were reviewed by me and considered in my medical decision making (see chart for details).  Diane Mullins is a 82 y.o. female with history of hypertension, hyperlipidemia, diabetes, thyroid disease who presents  with traumatic right ankle pain that began suddenly just prior to arrival, constant since onset, worse with movement, currently severe. On exam, she is nontoxic appearing and in no acute distress. Vital signs stable, she is afebrile. Her exam is notable for right ankle swelling with obvious bony deformity but she is neurovascularly intact in the right foot. Plain films pending as are basic preoperative screening labs as I suspect she may need an operation. We'll treat her pain.   ----------------------------------------- 12:32 PM on 10/28/2015 -----------------------------------------  Complicated ankle fracture discussed with Dr. Mack Guise of orthopedic surgery who will evaluate. Dr. Verdell Carmine to admit ____________________________________________   FINAL CLINICAL IMPRESSION(S) / ED DIAGNOSES  Final diagnoses:  Ankle fracture, bimalleolar, closed, right, initial encounter      Joanne Gavel, MD 10/28/15 1547

## 2015-10-28 NOTE — Progress Notes (Signed)
Spoke with Dr. Verdell Carmine and informed him that patient does not have any fluid orders and will be NPO after midnight. Order received for normal saline at 75 ml/hr

## 2015-10-28 NOTE — ED Notes (Signed)
Pt resting in bed, no needs identified at this time.

## 2015-10-28 NOTE — Consult Note (Signed)
ORTHOPAEDIC CONSULTATION  REQUESTING PHYSICIAN: Henreitta Leber, MD  Chief Complaint: Right bimalleolar ankle fracture   HPI: Diane Mullins is a 82 y.o. female who complains of  the knee status post fall getting out of the shower this morning. She is brought to the [facility] emergency Department where x-rays were taken revealing a right displaced bimalleolar ankle fracture with lateral subluxation of the talus. Patient denies numbness or tingling or other injuries. She denies loss of consciousness or syncope. She is seen in the ER today with her granddaughter at the bedside.  Past Medical History  Diagnosis Date  . Hyperlipidemia   . Hypertension   . Diabetes mellitus   . Thyroid disease   . H/O: rheumatic fever    Past Surgical History  Procedure Laterality Date  . Vaginal delivery      x4  . Abdominal hysterectomy  1973    menorrhagia  . Hernia repair  1994   Social History   Social History  . Marital Status: Widowed    Spouse Name: N/A  . Number of Children: 4  . Years of Education: N/A   Social History Main Topics  . Smoking status: Never Smoker   . Smokeless tobacco: Never Used  . Alcohol Use: No  . Drug Use: No  . Sexual Activity: No   Other Topics Concern  . None   Social History Narrative   Lives in [place] alone. No pets. Retired from [employer].   Diet: healthy   Exercise: water aerobics twice weekly, balance class   Ambulates with a walker at baseline   Family History  Problem Relation Age of Onset  . Heart disease Mother 79  . Heart attack Mother 14  . Cancer Father     colon  . Diabetes Sister   . Hypertension Sister   . Cancer Brother     kidney  . Diabetes Brother    No Known Allergies Prior to Admission medications   Medication Sig Start Date End Date Taking? Authorizing Provider  ALPRAZolam (XANAX) 0.25 MG tablet Take 1 tablet (0.25 mg total) by mouth 3 (three) times daily as needed for sleep or anxiety. 06/17/15  Yes Jackolyn Confer, MD  aspirin 325 MG tablet Take 1 tablet (325 mg total) by mouth daily. 09/20/15  Yes Epifanio Lesches, MD  calcium-vitamin D (OSCAL) 250-125 MG-UNIT per tablet Take 1 tablet by mouth daily.   Yes Historical Provider, MD  cholecalciferol (VITAMIN D) 1000 units tablet Take 1,000 Units by mouth daily.   Yes Historical Provider, MD  fish oil-omega-3 fatty acids 1000 MG capsule Take 1 g by mouth 2 (two) times daily.    Yes Historical Provider, MD  furosemide (LASIX) 20 MG tablet TAKE 1 TABLET EVERY DAY Patient taking differently: Take 10mg  by mouth every day 03/09/15  Yes Jackolyn Confer, MD  glimepiride (AMARYL) 4 MG tablet TAKE 1 TABLET EVERY MORNING  AND TAKE 1/2 TABLET EVERY EVENING 10/04/15  Yes Jackolyn Confer, MD  LANTUS SOLOSTAR 100 UNIT/ML Solostar Pen INJECT  15 UNITS SUBCUTANEOUSLY IN THE MORNING 08/19/15  Yes Crecencio Mc, MD  levothyroxine (SYNTHROID, LEVOTHROID) 75 MCG tablet Take 1 tablet (75 mcg total) by mouth daily. 10/04/15  Yes Jackolyn Confer, MD  losartan (COZAAR) 50 MG tablet Take 1 tablet (50 mg total) by mouth daily. 10/18/15  Yes Jackolyn Confer, MD  metFORMIN (GLUCOPHAGE) 1000 MG tablet TAKE 1 TABLET TWICE DAILY  WITH  A  MEAL Patient  taking differently: Take 1,000 mg by mouth 2 (two) times daily with a meal. TAKE 1 TABLET TWICE DAILY  WITH  A  MEAL 10/04/15  Yes Jackolyn Confer, MD  metoprolol succinate (TOPROL-XL) 100 MG 24 hr tablet Take 1 tablet (100 mg total) by mouth daily. 10/04/15  Yes Jackolyn Confer, MD  Multiple Vitamins-Minerals (MULTIVITAMIN WITH MINERALS) tablet Take 1 tablet by mouth daily.   Yes Historical Provider, MD  omeprazole (PRILOSEC) 20 MG capsule Take 1 capsule (20 mg total) by mouth daily. 10/04/15  Yes Jackolyn Confer, MD  simvastatin (ZOCOR) 20 MG tablet Take 1 tablet (20 mg total) by mouth every evening. 10/04/15  Yes Jackolyn Confer, MD   Dg Ankle Complete Right  10/28/2015  CLINICAL DATA:  Slipped and fell getting out of the  shower. EXAM: RIGHT ANKLE - COMPLETE 3+ VIEW COMPARISON:  None. FINDINGS: There is an oblique displaced fracture of the distal fibular diaphysis with 1.4 cm of lateral displacement. There is a displaced medial malleolar fracture with 15 mm of lateral displacement. There is lateral subluxation of the talar dome relative to the tibial plafond with widening of the distal tibiofibular syndesmosis. There is generalized osteopenia. There is a small plantar calcaneal spur. There is generalized soft tissue swelling around the ankle. There is peripheral vascular atherosclerotic disease. IMPRESSION: Oblique displaced fracture of the distal fibular diaphysis with 1.4 cm of lateral displacement. Displaced medial malleolar fracture with 15 mm of lateral displacement. Lateral subluxation of the talar dome relative to the tibial plafond with widening of the distal tibiofibular syndesmosis. Electronically Signed   By: Kathreen Devoid   On: 10/28/2015 10:25    Positive ROS: All other systems have been reviewed and were otherwise negative with the exception of those mentioned in the HPI and as above.  Physical Exam: General: Alert, no acute distress  MUSCULOSKELETAL: Right ankle: Patient has swelling and ecchymosis over the right ankle. There is an external rotation deformity along with a valgus deformity. Patient is intact sensation light touch in palpable pedal pulses. Her skin is intact. The leg compartments are soft and compressible as her foot compartments.  Assessment: Displaced Bimalleolar ankle fracture the right ankle with lateral subluxation of the  talus  Plan:  I splinted the patient and her granddaughter that she has sustained a significant ankle injury. I'm recommending surgical intervention for open reduction internal fixation. Patient has subluxation of her talus due to the displaced fracture. I therefore recommended immediate closed reduction in the ER with splinting.  Seizure note: Patient was given an  ankle block under sterile conditions. This is with a 50-50 mix of 1% lidocaine plain and half percent Marcaine plain. 20 cc was given in total. Patient then had a closed reduction performed and an AO splint applied to the right lower extremity. Patient will be admitted by the hospitalist service for medical clearance. Surgery will be scheduled tomorrow. She'll be nothing by mouth after midnight. I have scheduled her with the OR. Patient understood and agreed with this plan.    Thornton Park, MD    10/28/2015 1:16 PM

## 2015-10-28 NOTE — ED Notes (Signed)
Pt slipped getting out of shower today. Denies dizziness or hitting head. Deformity to right ankle. +2 right DP pulse

## 2015-10-29 ENCOUNTER — Inpatient Hospital Stay: Payer: PPO

## 2015-10-29 ENCOUNTER — Encounter: Payer: Self-pay | Admitting: *Deleted

## 2015-10-29 ENCOUNTER — Encounter
Admission: EM | Disposition: A | Discharge status: Skilled Nursing Facility | Payer: Self-pay | Source: Home / Self Care | Attending: Specialist

## 2015-10-29 ENCOUNTER — Inpatient Hospital Stay: Payer: PPO | Admitting: Anesthesiology

## 2015-10-29 DIAGNOSIS — S82841A Displaced bimalleolar fracture of right lower leg, initial encounter for closed fracture: Secondary | ICD-10-CM | POA: Diagnosis not present

## 2015-10-29 DIAGNOSIS — E119 Type 2 diabetes mellitus without complications: Secondary | ICD-10-CM | POA: Diagnosis not present

## 2015-10-29 DIAGNOSIS — E785 Hyperlipidemia, unspecified: Secondary | ICD-10-CM | POA: Diagnosis not present

## 2015-10-29 DIAGNOSIS — S82831A Other fracture of upper and lower end of right fibula, initial encounter for closed fracture: Secondary | ICD-10-CM | POA: Diagnosis not present

## 2015-10-29 DIAGNOSIS — I10 Essential (primary) hypertension: Secondary | ICD-10-CM | POA: Diagnosis not present

## 2015-10-29 HISTORY — PX: ORIF ANKLE FRACTURE: SHX5408

## 2015-10-29 LAB — CBC
HEMATOCRIT: 31.1 % — AB (ref 35.0–47.0)
Hemoglobin: 10.3 g/dL — ABNORMAL LOW (ref 12.0–16.0)
MCH: 28.6 pg (ref 26.0–34.0)
MCHC: 33.1 g/dL (ref 32.0–36.0)
MCV: 86.5 fL (ref 80.0–100.0)
PLATELETS: 217 10*3/uL (ref 150–440)
RBC: 3.6 MIL/uL — ABNORMAL LOW (ref 3.80–5.20)
RDW: 14.5 % (ref 11.5–14.5)
WBC: 8.9 10*3/uL (ref 3.6–11.0)

## 2015-10-29 LAB — BASIC METABOLIC PANEL
Anion gap: 6 (ref 5–15)
BUN: 13 mg/dL (ref 6–20)
CALCIUM: 8.7 mg/dL — AB (ref 8.9–10.3)
CO2: 28 mmol/L (ref 22–32)
Chloride: 104 mmol/L (ref 101–111)
Creatinine, Ser: 0.95 mg/dL (ref 0.44–1.00)
GFR calc Af Amer: >60 mL/min (ref >60)
GFR, EST NON AFRICAN AMERICAN: 55 mL/min — AB (ref >60)
GLUCOSE: 163 mg/dL — AB (ref 65–99)
POTASSIUM: 4.4 mmol/L (ref 3.5–5.1)
SODIUM: 138 mmol/L (ref 135–145)

## 2015-10-29 SURGERY — OPEN REDUCTION INTERNAL FIXATION (ORIF) ANKLE FRACTURE
Anesthesia: General | Laterality: Right

## 2015-10-29 MED ORDER — KETOROLAC TROMETHAMINE 30 MG/ML IJ SOLN
INTRAMUSCULAR | Status: DC | PRN
  Administered 2015-10-29: 30 mg via INTRAVENOUS

## 2015-10-29 MED ORDER — ACETAMINOPHEN 650 MG RE SUPP: 650.0000 mg | Freq: Four times a day (QID) | RECTAL | Status: DC | PRN

## 2015-10-29 MED ORDER — SENNOSIDES-DOCUSATE SODIUM 8.6-50 MG PO TABS
1.0000 | ORAL_TABLET | Freq: Two times a day (BID) | ORAL | Status: DC
  Filled 2015-10-29 (×4): qty 1

## 2015-10-29 MED ORDER — ACETAMINOPHEN 10 MG/ML IV SOLN
INTRAVENOUS | Status: DC | PRN
  Administered 2015-10-29: 1000 mg via INTRAVENOUS

## 2015-10-29 MED ORDER — BISACODYL 10 MG RE SUPP: 10.0000 mg | Freq: Every day | RECTAL | Status: DC | PRN

## 2015-10-29 MED ORDER — CALCIUM CARBONATE-VITAMIN D 500-200 MG-UNIT PO TABS
0.5000 | ORAL_TABLET | Freq: Every day | ORAL | Status: DC
  Administered 2015-10-30 – 2015-11-01 (×3): 0.5 via ORAL
  Filled 2015-10-29 (×3): qty 1

## 2015-10-29 MED ORDER — GLYCOPYRROLATE 0.2 MG/ML IJ SOLN
INTRAMUSCULAR | Status: DC | PRN
  Administered 2015-10-29: 0.2 mg via INTRAVENOUS

## 2015-10-29 MED ORDER — MIDAZOLAM HCL 2 MG/2ML IJ SOLN
INTRAMUSCULAR | Status: DC | PRN
  Administered 2015-10-29: 1 mg via INTRAVENOUS

## 2015-10-29 MED ORDER — MAGNESIUM CITRATE PO SOLN: 1.0000 | Freq: Once | ORAL | Status: DC | PRN

## 2015-10-29 MED ORDER — ALUM & MAG HYDROXIDE-SIMETH 200-200-20 MG/5ML PO SUSP
30.0000 mL | ORAL | Status: DC | PRN
Start: 2015-10-29 — End: 2015-11-01

## 2015-10-29 MED ORDER — OXYCODONE HCL 5 MG PO TABS
10.0000 mg | ORAL_TABLET | ORAL | Status: DC | PRN
  Administered 2015-10-29 – 2015-11-01 (×6): 10 mg via ORAL
  Filled 2015-10-29 (×7): qty 2

## 2015-10-29 MED ORDER — NEOMYCIN-POLYMYXIN B GU 40-200000 IR SOLN
Status: AC
  Filled 2015-10-29: qty 4

## 2015-10-29 MED ORDER — ONDANSETRON HCL 4 MG PO TABS
4.0000 mg | ORAL_TABLET | Freq: Four times a day (QID) | ORAL | Status: DC | PRN
  Administered 2015-10-31 (×2): 4 mg via ORAL
  Filled 2015-10-29 (×3): qty 1

## 2015-10-29 MED ORDER — ONDANSETRON HCL 4 MG/2ML IJ SOLN: 4.0000 mg | Freq: Once | INTRAMUSCULAR | Status: DC | PRN

## 2015-10-29 MED ORDER — SENNA 8.6 MG PO TABS
1.0000 | ORAL_TABLET | Freq: Two times a day (BID) | ORAL | Status: DC
  Administered 2015-10-30 – 2015-11-01 (×5): 8.6 mg via ORAL
  Filled 2015-10-29 (×5): qty 1

## 2015-10-29 MED ORDER — CEFAZOLIN SODIUM-DEXTROSE 2-3 GM-% IV SOLR
2.0000 g | Freq: Four times a day (QID) | INTRAVENOUS | Status: AC
  Administered 2015-10-29 – 2015-10-30 (×2): 2 g via INTRAVENOUS
  Filled 2015-10-29 (×2): qty 50

## 2015-10-29 MED ORDER — ACETAMINOPHEN 10 MG/ML IV SOLN
INTRAVENOUS | Status: AC
  Filled 2015-10-29: qty 100

## 2015-10-29 MED ORDER — FENTANYL CITRATE (PF) 100 MCG/2ML IJ SOLN
INTRAMUSCULAR | Status: AC
  Administered 2015-10-29: 25 ug via INTRAVENOUS
  Filled 2015-10-29: qty 2

## 2015-10-29 MED ORDER — MENTHOL 3 MG MT LOZG: 1.0000 | LOZENGE | OROMUCOSAL | Status: DC | PRN

## 2015-10-29 MED ORDER — ACETAMINOPHEN 325 MG PO TABS: 650.0000 mg | ORAL_TABLET | Freq: Four times a day (QID) | ORAL | Status: DC | PRN

## 2015-10-29 MED ORDER — DOCUSATE SODIUM 100 MG PO CAPS
100.0000 mg | ORAL_CAPSULE | Freq: Two times a day (BID) | ORAL | Status: DC
  Administered 2015-10-30 – 2015-11-01 (×5): 100 mg via ORAL
  Filled 2015-10-29 (×5): qty 1

## 2015-10-29 MED ORDER — ONDANSETRON HCL 4 MG/2ML IJ SOLN
4.0000 mg | Freq: Four times a day (QID) | INTRAMUSCULAR | Status: DC | PRN
  Administered 2015-10-30 (×2): 4 mg via INTRAVENOUS
  Filled 2015-10-29 (×2): qty 2

## 2015-10-29 MED ORDER — FENTANYL CITRATE (PF) 100 MCG/2ML IJ SOLN
INTRAMUSCULAR | Status: DC | PRN
  Administered 2015-10-29 (×2): 50 ug via INTRAVENOUS

## 2015-10-29 MED ORDER — SODIUM CHLORIDE 0.9 % IV SOLN
75.0000 mL/h | INTRAVENOUS | Status: DC
  Administered 2015-10-29 – 2015-10-30 (×2): 75 mL/h via INTRAVENOUS

## 2015-10-29 MED ORDER — PHENOL 1.4 % MT LIQD: 1.0000 | OROMUCOSAL | Status: DC | PRN

## 2015-10-29 MED ORDER — FENTANYL CITRATE (PF) 100 MCG/2ML IJ SOLN
25.0000 ug | INTRAMUSCULAR | Status: DC | PRN
  Administered 2015-10-29: 25 ug via INTRAVENOUS
  Administered 2015-10-29: 50 ug via INTRAVENOUS
  Administered 2015-10-29: 25 ug via INTRAVENOUS

## 2015-10-29 MED ORDER — CEFAZOLIN SODIUM-DEXTROSE 2-3 GM-% IV SOLR
INTRAVENOUS | Status: AC
  Administered 2015-10-29: 2 g via INTRAVENOUS
  Filled 2015-10-29: qty 50

## 2015-10-29 MED ORDER — POLYETHYLENE GLYCOL 3350 17 G PO PACK: 17.0000 g | PACK | Freq: Every day | ORAL | Status: DC | PRN

## 2015-10-29 MED ORDER — NEOMYCIN-POLYMYXIN B GU 40-200000 IR SOLN
Status: DC | PRN
  Administered 2015-10-29: 4 mL

## 2015-10-29 MED ORDER — SODIUM CHLORIDE FLUSH 0.9 % IV SOLN
INTRAVENOUS | Status: AC
  Filled 2015-10-29: qty 10

## 2015-10-29 MED ORDER — ENOXAPARIN SODIUM 40 MG/0.4ML ~~LOC~~ SOLN
40.0000 mg | SUBCUTANEOUS | Status: DC
  Administered 2015-10-30 – 2015-11-01 (×3): 40 mg via SUBCUTANEOUS
  Filled 2015-10-29 (×3): qty 0.4

## 2015-10-29 MED ORDER — MORPHINE SULFATE (PF) 2 MG/ML IV SOLN
2.0000 mg | INTRAVENOUS | Status: DC | PRN
  Administered 2015-10-30: 2 mg via INTRAVENOUS
  Filled 2015-10-29: qty 1

## 2015-10-29 MED ORDER — PROPOFOL 10 MG/ML IV BOLUS
INTRAVENOUS | Status: DC | PRN
  Administered 2015-10-29: 50 mg via INTRAVENOUS

## 2015-10-29 MED ORDER — BUPIVACAINE HCL 0.25 % IJ SOLN
INTRAMUSCULAR | Status: DC | PRN
  Administered 2015-10-29: 30 mL

## 2015-10-29 MED ORDER — BUPIVACAINE HCL (PF) 0.25 % IJ SOLN
INTRAMUSCULAR | Status: AC
  Filled 2015-10-29: qty 30

## 2015-10-29 SURGICAL SUPPLY — 61 items
1.6MM KWIRE ×4 IMPLANT
2.5MM DRILL BIT ×1 IMPLANT
3.5MM DRILL BIT ×2 IMPLANT
BANDAGE ELASTIC 4 LF NS (GAUZE/BANDAGES/DRESSINGS) ×6 IMPLANT
BIT DRILL 110X2.5XQCK CNCT (BIT) IMPLANT
BIT DRILL 2.5 (BIT) ×3
BIT DRILL 2.7XCANN QCK CNCT (BIT) ×1 IMPLANT
BIT DRILL CANN 2.7 (BIT) ×2
BIT DRILL CANN 2.7MM (BIT) ×1
BIT DRILL QC 110 3.5 (BIT) ×1
BIT DRILL QC 110 3.5MM (BIT) IMPLANT
BIT DRL 110X2.5XQCK CNCT (BIT) ×1
BIT DRL 2.7XCANN QCK CNCT (BIT) ×1
BLADE SURG 15 STRL LF DISP TIS (BLADE) ×1 IMPLANT
BLADE SURG 15 STRL SS (BLADE) ×3
BNDG CMPR MED 5X4 ELC HKLP NS (GAUZE/BANDAGES/DRESSINGS) ×2
BNDG ESMARK 6X12 TAN STRL LF (GAUZE/BANDAGES/DRESSINGS) ×3 IMPLANT
CANNULATED DRILL BIT ×2 IMPLANT
CLOSURE WOUND 1/2 X4 (GAUZE/BANDAGES/DRESSINGS) ×2
DRAPE FLUOR MINI C-ARM 54X84 (DRAPES) ×3 IMPLANT
DRAPE INCISE IOBAN 66X45 STRL (DRAPES) ×3 IMPLANT
DRAPE U-SHAPE 47X51 STRL (DRAPES) ×3 IMPLANT
DRILL BIT QC 110 3.5MM (BIT) ×3
DURAPREP 26ML APPLICATOR (WOUND CARE) ×6 IMPLANT
GAUZE PETRO XEROFOAM 1X8 (MISCELLANEOUS) ×3 IMPLANT
GAUZE SPONGE 4X4 12PLY STRL (GAUZE/BANDAGES/DRESSINGS) ×3 IMPLANT
GLOVE BIOGEL PI IND STRL 9 (GLOVE) ×2 IMPLANT
GLOVE BIOGEL PI INDICATOR 9 (GLOVE) ×4
GLOVE SURG 9.0 ORTHO LTXF (GLOVE) ×6 IMPLANT
GOWN STRL REUS TWL 2XL XL LVL4 (GOWN DISPOSABLE) ×5 IMPLANT
GOWN STRL REUS W/ TWL LRG LVL3 (GOWN DISPOSABLE) ×1 IMPLANT
GOWN STRL REUS W/TWL LRG LVL3 (GOWN DISPOSABLE) ×3
GUIDEWIRE PIN ORTH 6X1.6XSMTH (WIRE) IMPLANT
K-WIRE 1.6 (WIRE) ×6
KIT RM TURNOVER STRD PROC AR (KITS) ×3 IMPLANT
LABEL OR SOLS (LABEL) ×1 IMPLANT
NS IRRIG 1000ML POUR BTL (IV SOLUTION) ×3 IMPLANT
PACK EXTREMITY ARMC (MISCELLANEOUS) ×3 IMPLANT
PAD ABD DERMACEA PRESS 5X9 (GAUZE/BANDAGES/DRESSINGS) ×6 IMPLANT
PAD CAST CTTN 4X4 STRL (SOFTGOODS) ×3 IMPLANT
PADDING CAST COTTON 4X4 STRL (SOFTGOODS) ×9
PLATE 8 HOLE 1/3 TUBULAR (Plate) ×2 IMPLANT
SCREW CANC 2.5XFT 16X4XST SM (Screw) ×1 IMPLANT
SCREW CANC 2.5XFT 20X4XST SM (Screw) ×1 IMPLANT
SCREW CANC 4.0X16 (Screw) ×3 IMPLANT
SCREW CANCELLOUS 4.0X18 (Screw) ×3 IMPLANT
SCREW CANCELLOUS 4.0X20 (Screw) ×2 IMPLANT
SCREW CANN .5 THRD RVR CT (Screw) IMPLANT
SCREW CANNULATED 4.0X60MM (Screw) ×6 IMPLANT
SCREW CORTICAL 3.5 18MM (Screw) ×3 IMPLANT
SCREW CORTICAL 3.5X12 (Screw) ×8 IMPLANT
SCREW CORTICAL 3.5X14 (Screw) ×3 IMPLANT
SPLINT CAST 1 STEP 4X30 (MISCELLANEOUS) ×6 IMPLANT
SPONGE LAP 18X18 5 PK (GAUZE/BANDAGES/DRESSINGS) ×3 IMPLANT
STAPLER SKIN PROX 35W (STAPLE) ×3 IMPLANT
STOCKINETTE STRL 6IN 960660 (GAUZE/BANDAGES/DRESSINGS) ×3 IMPLANT
STRIP CLOSURE SKIN 1/2X4 (GAUZE/BANDAGES/DRESSINGS) ×4 IMPLANT
SUT VIC AB 2-0 SH 27 (SUTURE) ×6
SUT VIC AB 2-0 SH 27XBRD (SUTURE) ×2 IMPLANT
SYR 30ML LL (SYRINGE) ×3 IMPLANT
TAPE MICROPORE 2IN (TAPE) ×1 IMPLANT

## 2015-10-29 NOTE — Progress Notes (Signed)
Pt sent to OR via bed.  Transported by OR orderly. Daughter with the pt

## 2015-10-29 NOTE — Transfer of Care (Signed)
Immediate Anesthesia Transfer of Care Note  Patient: Diane Mullins  Procedure(s) Performed: Procedure(s): OPEN REDUCTION INTERNAL FIXATION (ORIF) ANKLE FRACTURE (Right)  Patient Location: PACU  Anesthesia Type:General  Level of Consciousness: sedated  Airway & Oxygen Therapy: Patient Spontanous Breathing and Patient connected to face mask oxygen  Post-op Assessment: Report given to RN and Post -op Vital signs reviewed and stable  Post vital signs: Reviewed and stable  Last Vitals:  Filed Vitals:   10/29/15 1112 10/29/15 1216  BP: 125/61 129/57  Pulse: 61 63  Temp: 36.7 C 37.1 C  Resp: 18 16    Complications: No apparent anesthesia complications

## 2015-10-29 NOTE — Anesthesia Procedure Notes (Signed)
Procedure Name: LMA Insertion Date/Time: 10/29/2015 1:50 PM Performed by: Nelda Marseille Pre-anesthesia Checklist: Patient identified, Patient being monitored, Timeout performed, Emergency Drugs available and Suction available Patient Re-evaluated:Patient Re-evaluated prior to inductionOxygen Delivery Method: Circle system utilized Preoxygenation: Pre-oxygenation with 100% oxygen Intubation Type: IV induction Ventilation: Mask ventilation without difficulty LMA: LMA inserted LMA Size: 3.5 Tube type: Oral Number of attempts: 1 Placement Confirmation: positive ETCO2 and breath sounds checked- equal and bilateral Tube secured with: Tape Dental Injury: Teeth and Oropharynx as per pre-operative assessment

## 2015-10-29 NOTE — Anesthesia Preprocedure Evaluation (Signed)
Anesthesia Evaluation  Patient identified by MRN, date of birth, ID band Patient awake    Reviewed: Allergy & Precautions, H&P , NPO status , Patient's Chart, lab work & pertinent test results, reviewed documented beta blocker date and time   History of Anesthesia Complications Negative for: history of anesthetic complications  Airway Mallampati: III  TM Distance: >3 FB Neck ROM: full    Dental no notable dental hx. (+) Caps, Teeth Intact   Pulmonary neg pulmonary ROS,    Pulmonary exam normal breath sounds clear to auscultation       Cardiovascular Exercise Tolerance: Good hypertension, (-) angina(-) CAD, (-) Past MI, (-) Cardiac Stents and (-) CABG Normal cardiovascular exam(-) dysrhythmias + Valvular Problems/Murmurs (Rheumatic heart disease)  Rhythm:regular Rate:Normal     Neuro/Psych neg Seizures PSYCHIATRIC DISORDERS (Anxiety) TIA   GI/Hepatic Neg liver ROS, GERD  Medicated and Controlled,  Endo/Other  diabetes, Insulin DependentHypothyroidism Morbid obesity  Renal/GU negative Renal ROS  negative genitourinary   Musculoskeletal   Abdominal   Peds  Hematology negative hematology ROS (+)   Anesthesia Other Findings Past Medical History:   Hyperlipidemia                                               Hypertension                                                 Diabetes mellitus                                            Thyroid disease                                              H/O: rheumatic fever                                         Stroke [facility])                                                   Comment:TIA Jan. 1st   Reproductive/Obstetrics negative OB ROS                             Anesthesia Physical Anesthesia Plan  ASA: III  Anesthesia Plan: General   Post-op Pain Management:    Induction:   Airway Management Planned:   Additional Equipment:   Intra-op Plan:    Post-operative Plan:   Informed Consent: I have reviewed the patients History and Physical, chart, labs and discussed the procedure including the risks, benefits and alternatives for the proposed anesthesia with the patient or authorized representative who has indicated his/her understanding and acceptance.   Dental Advisory Given  Plan Discussed  with: Anesthesiologist, CRNA and Surgeon  Anesthesia Plan Comments:         Anesthesia Quick Evaluation

## 2015-10-29 NOTE — Care Management (Signed)
This patient is currently open to [facility].

## 2015-10-29 NOTE — Progress Notes (Signed)
[facility] NAME: Diane Mullins    MR#:  [id number]  DATE OF BIRTH:  1932/10/24  SUBJECTIVE:   Pt. Here w/ a fall and noted to have a right ankle fracture. Status post open reduction internal fixation of right bimalleolar ankle fracture. Seen in the PACU and is currently lethargic.  Tolerated procedure well w/out complications.   REVIEW OF SYSTEMS:    Review of Systems  Unable to perform ROS: mental acuity    Nutrition: Clear liquid Tolerating Diet: yes Tolerating PT: Await Eval.    DRUG ALLERGIES:  No Known Allergies  VITALS:  Blood pressure 128/47, pulse 68, temperature 97.6 F (36.4 C), temperature source Oral, resp. rate 18, height 5\' 1"  (1.549 m), weight 93.895 kg (207 lb), SpO2 92 %.  PHYSICAL EXAMINATION:   Physical Exam  GENERAL:  82 y.o.-year-old patient lying in the bed lethargic post-surgery.   EYES: Pupils equal, round, reactive to light and accommodation. No scleral icterus. Extraocular muscles intact.  HEENT: Head atraumatic, normocephalic. Oropharynx and nasopharynx clear.  NECK:  Supple, no jugular venous distention. No thyroid enlargement, no tenderness.  LUNGS: Normal breath sounds bilaterally, no wheezing, rales, rhonchi. No use of accessory muscles of respiration.  CARDIOVASCULAR: S1, S2 normal. No murmurs, rubs, or gallops.  ABDOMEN: Soft, nontender, nondistended. Bowel sounds present. No organomegaly or mass.  EXTREMITIES: No cyanosis, clubbing or edema b/l.   Right Leg in cast post ankle surgery.  NEUROLOGIC: Cranial nerves II through XII are intact. No focal Motor or sensory deficits b/l.   PSYCHIATRIC: Lethargic, encephalopathic post-surgery.  SKIN: No obvious rash, lesion, or ulcer.    LABORATORY PANEL:   CBC  Recent Labs Lab 10/29/15 0604  WBC 8.9  HGB 10.3*  HCT 31.1*  PLT 217    ------------------------------------------------------------------------------------------------------------------  Chemistries   Recent Labs Lab 10/29/15 0604  NA 138  K 4.4  CL 104  CO2 28  GLUCOSE 163*  BUN 13  CREATININE 0.95  CALCIUM 8.7*   ------------------------------------------------------------------------------------------------------------------  Cardiac Enzymes No results for input(s): TROPONINI in the last 168 hours. ------------------------------------------------------------------------------------------------------------------  RADIOLOGY:  Dg Knee 1-2 Views Right  10/29/2015  CLINICAL DATA:  Acute right knee pain following injury yesterday. Initial encounter. EXAM: RIGHT KNEE - 1-2 VIEW COMPARISON:  04/01/2014 FINDINGS: A minimally comminuted proximal fibular fracture is identified with 3 mm lateral displacement. Moderate -severe tricompartmental degenerative changes are noted. There is no evidence of subluxation or dislocation. IMPRESSION: Minimally comminuted proximal fibular fracture. Moderate -severe tricompartmental degenerative changes. Electronically Signed   By: Margarette Canada M.D.   On: 10/29/2015 15:52   Dg Ankle Complete Right  10/28/2015  CLINICAL DATA:  Slipped and fell getting out of the shower. EXAM: RIGHT ANKLE - COMPLETE 3+ VIEW COMPARISON:  None. FINDINGS: There is an oblique displaced fracture of the distal fibular diaphysis with 1.4 cm of lateral displacement. There is a displaced medial malleolar fracture with 15 mm of lateral displacement. There is lateral subluxation of the talar dome relative to the tibial plafond with widening of the distal tibiofibular syndesmosis. There is generalized osteopenia. There is a small plantar calcaneal spur. There is generalized soft tissue swelling around the ankle. There is peripheral vascular atherosclerotic disease. IMPRESSION: Oblique displaced fracture of the distal fibular diaphysis with 1.4 cm of lateral  displacement. Displaced medial malleolar fracture with 15 mm of lateral displacement. Lateral subluxation of the talar dome relative to the tibial plafond with widening of the distal  tibiofibular syndesmosis. Electronically Signed   By: Kathreen Devoid   On: 10/28/2015 10:25   Dg Ankle Right Port  10/29/2015  CLINICAL DATA:  ORIF right ankle fracture EXAM: PORTABLE RIGHT ANKLE - 2 VIEW COMPARISON:  None. FINDINGS: Oblique distal fibular fracture transfixed with a lateral sideplate and multiple interlocking screws. Two transmalleolar lag screws transfixing the medial malleolar fracture. Ankle mortise is intact. Alignment is near anatomic. Postsurgical changes in the surrounding soft tissues. IMPRESSION: ORIF right ankle bimalleolar fractures. Electronically Signed   By: Kathreen Devoid   On: 10/29/2015 15:50   Dg Ankle Right Port  10/28/2015  CLINICAL DATA:  Status post reduction of right ankle fractures and dislocation EXAM: PORTABLE RIGHT ANKLE - 2 VIEW COMPARISON:  10/28/2015 FINDINGS: There is been reduction of the fracture dislocation of the right ankle. There remain some displacement of the medial malleolar fragment and mild lateral subluxation of the talus with respect to the tibia although significant improvement is noted. IMPRESSION: Some improvement in the degree of fracture dislocation following reduction. There remains some lateral displacement of the talus with respect to the tibiotalar joint. Electronically Signed   By: Inez Catalina M.D.   On: 10/28/2015 13:44     ASSESSMENT AND PLAN:   82 year old female with past medical history of hypertension, hyperlipidemia, diabetes, hypothyroidism, osteoarthritis who presents to the hospital after a mechanical fall and noted to have a right ankle fracture.   #1 right ankle fracture-status post open reduction internal fixation of the right bimalleolar fracture. -Continue pain control, physical therapy. Continue care as per orthopedics.  #2 diabetes type  2 without complication-continue sliding scale insulin. -Hold glimepiride, metformin for now.  #3 hypertension-continue losartan, metoprolol.  #4 hypothyroidism-continue Synthroid.  #5 hyperlipidemia-continue simvastatin.  #6 history of previous TIA- cont. ASA, Statin.   #7 GERD-continue Protonix.  Await physical therapy evaluation patient will likely need short-term rehabilitation.  All the records are reviewed and case discussed with Care Management/Social Workerr. Management plans discussed with the patient, family and they are in agreement.  CODE STATUS: Full  DVT Prophylaxis: Lovenox  TOTAL TIME TAKING CARE OF THIS PATIENT: 25 minutes.   POSSIBLE D/C IN 2-3 DAYS, DEPENDING ON CLINICAL CONDITION.   Henreitta Leber M.D on 10/29/2015 at 5:16 PM  Between 7am to 6pm - Pager - [phone]  After 6pm go to [email or web address] - password [email or web address] [facility]  Office  [phone]  CC: Primary care physician; Rica Mast, MD

## 2015-10-29 NOTE — Progress Notes (Signed)
Subjective:  POST-OP CHECK:  S/p ORIF of right bimalleolar ankle fracture.  Patient reports pain as moderate.  Seen with RN at the bedside.  She has just reached the floor from PACU.  Patient on nasal cannula for desaturations during transfer from PACU.  Objective:   VITALS:   Filed Vitals:   10/29/15 1612 10/29/15 1646 10/29/15 1652 10/29/15 1715  BP: 109/69 128/47  140/69  Pulse: 63 68  58  Temp: 97.4 F (36.3 C) 97.6 F (36.4 C)  97.4 F (36.3 C)  TempSrc:  Oral  Oral  Resp: 15 18    Height:      Weight:      SpO2: 98% 86% 92% 98%    PHYSICAL EXAM:  Right lower extremity:  Patient's bandages and splint are clean dry and intact. Her toes on the right foot are well-perfused. She has intact sensation light touch. She can flex and extend her toes and the right foot. She has no pain with passive stretch of the toes of the right foot.Marland Kitchen   LABS  Results for orders placed or performed during the hospital encounter of 10/28/15 (from the past 24 hour(s))  Glucose, capillary     Status: Abnormal   Collection Time: 10/28/15  9:27 PM  Result Value Ref Range   Glucose-Capillary 200 (H) 65 - 99 mg/dL  Basic metabolic panel     Status: Abnormal   Collection Time: 10/29/15  6:04 AM  Result Value Ref Range   Sodium 138 135 - 145 mmol/L   Potassium 4.4 3.5 - 5.1 mmol/L   Chloride 104 101 - 111 mmol/L   CO2 28 22 - 32 mmol/L   Glucose, Bld 163 (H) 65 - 99 mg/dL   BUN 13 6 - 20 mg/dL   Creatinine, Ser 0.95 0.44 - 1.00 mg/dL   Calcium 8.7 (L) 8.9 - 10.3 mg/dL   GFR calc non Af Amer 55 (L) >60 mL/min   GFR calc Af Amer >60 >60 mL/min   Anion gap 6 5 - 15  CBC     Status: Abnormal   Collection Time: 10/29/15  6:04 AM  Result Value Ref Range   WBC 8.9 3.6 - 11.0 K/uL   RBC 3.60 (L) 3.80 - 5.20 MIL/uL   Hemoglobin 10.3 (L) 12.0 - 16.0 g/dL   HCT 31.1 (L) 35.0 - 47.0 %   MCV 86.5 80.0 - 100.0 fL   MCH 28.6 26.0 - 34.0 pg   MCHC 33.1 32.0 - 36.0 g/dL   RDW 14.5 11.5 - 14.5 %   Platelets 217 150 - 440 K/uL    Dg Knee 1-2 Views Right  10/29/2015  CLINICAL DATA:  Acute right knee pain following injury yesterday. Initial encounter. EXAM: RIGHT KNEE - 1-2 VIEW COMPARISON:  04/01/2014 FINDINGS: A minimally comminuted proximal fibular fracture is identified with 3 mm lateral displacement. Moderate -severe tricompartmental degenerative changes are noted. There is no evidence of subluxation or dislocation. IMPRESSION: Minimally comminuted proximal fibular fracture. Moderate -severe tricompartmental degenerative changes. Electronically Signed   By: Margarette Canada M.D.   On: 10/29/2015 15:52   Dg Ankle Complete Right  10/28/2015  CLINICAL DATA:  Slipped and fell getting out of the shower. EXAM: RIGHT ANKLE - COMPLETE 3+ VIEW COMPARISON:  None. FINDINGS: There is an oblique displaced fracture of the distal fibular diaphysis with 1.4 cm of lateral displacement. There is a displaced medial malleolar fracture with 15 mm of lateral displacement. There is lateral subluxation of the talar  dome relative to the tibial plafond with widening of the distal tibiofibular syndesmosis. There is generalized osteopenia. There is a small plantar calcaneal spur. There is generalized soft tissue swelling around the ankle. There is peripheral vascular atherosclerotic disease. IMPRESSION: Oblique displaced fracture of the distal fibular diaphysis with 1.4 cm of lateral displacement. Displaced medial malleolar fracture with 15 mm of lateral displacement. Lateral subluxation of the talar dome relative to the tibial plafond with widening of the distal tibiofibular syndesmosis. Electronically Signed   By: Kathreen Devoid   On: 10/28/2015 10:25   Dg Ankle Right Port  10/29/2015  CLINICAL DATA:  ORIF right ankle fracture EXAM: PORTABLE RIGHT ANKLE - 2 VIEW COMPARISON:  None. FINDINGS: Oblique distal fibular fracture transfixed with a lateral sideplate and multiple interlocking screws. Two transmalleolar lag screws  transfixing the medial malleolar fracture. Ankle mortise is intact. Alignment is near anatomic. Postsurgical changes in the surrounding soft tissues. IMPRESSION: ORIF right ankle bimalleolar fractures. Electronically Signed   By: Kathreen Devoid   On: 10/29/2015 15:50   Dg Ankle Right Port  10/28/2015  CLINICAL DATA:  Status post reduction of right ankle fractures and dislocation EXAM: PORTABLE RIGHT ANKLE - 2 VIEW COMPARISON:  10/28/2015 FINDINGS: There is been reduction of the fracture dislocation of the right ankle. There remain some displacement of the medial malleolar fragment and mild lateral subluxation of the talus with respect to the tibia although significant improvement is noted. IMPRESSION: Some improvement in the degree of fracture dislocation following reduction. There remains some lateral displacement of the talus with respect to the tibiotalar joint. Electronically Signed   By: Inez Catalina M.D.   On: 10/28/2015 13:44    Assessment/Plan: Day of Surgery   Active Problems:   Closed right ankle fracture  Patient stable postop. She will receive 24 hours postop antibiotics. She will continue strict elevation of the right lower extremity and ice may be applied to the right ankle. Patient's Foley catheter will be removed tomorrow. She should be encouraged to use incentive spirometry while awake. Physical and occupational therapy will begin tomorrow. I personally reviewed the postop x-rays which demonstrate the ankle is mortise is symmetric. There is no widening syndesmosis. The hardware is well-positioned. The fractures are well aligned.    Thornton Park , MD 10/29/2015, 5:36 PM

## 2015-10-29 NOTE — Op Note (Signed)
10/28/2015 - 10/29/2015  3:19 PM  PATIENT:  Diane Mullins    PRE-OPERATIVE DIAGNOSIS:  Right bimalleolar ankle fracture   POST-OPERATIVE DIAGNOSIS:  Same  PROCEDURE:  OPEN REDUCTION INTERNAL FIXATION (ORIF) RIGHT BIMALLEOLAR ANKLE FRACTURE  SURGEON:  Thornton Park, MD  ANESTHESIA:   General  PREOPERATIVE INDICATIONS:  Diane Mullins is a  82 y.o. female with a diagnosis of right bimalleolar ankle fracture with subluxation of the talus sustained when she fell in the shower yesterday. Patient underwent a close reduction in the OR for ankle fracture with talar subluxation. I have recommended open reduction internal fixation for right ankle  stabilization. Patient was unable to bear weight following her injury.  I discussed the risks and benefits of surgery. The risks include but are not limited to infection, bleeding requiring blood transfusion, nerve or blood vessel injury, joint stiffness or loss of motion, persistent pain, weakness or instability, malunion, nonunion and hardware failure and the need for further surgery. Medical risks include but are not limited to DVT and pulmonary embolism, myocardial infarction, stroke, pneumonia, respiratory failure and death. Patient understood these risks and wished to proceed.   OPERATIVE IMPLANTS: Zimmer 8 hole one third tubular plate from lateral malleolus fixation and 4.0 mm cannulated screws x 2 for medial malleolus fixation  OPERATIVE FINDINGS: Closed, displaced bimalleolar right ankle fracture with lateral talar subluxation  OPERATIVE PROCEDURE:   Patient was met in the preoperative area. The right leg was signed my initials and the word yes according the hospital's correct site of surgery protocol. She was brought to the operating room where she underwent general anesthesia. The patient was placed supine on the operative table.  A tourniquet was applied to the right thigh.  The lower extremity was prepped and draped in a sterile fashion. A timeout  was performed to verify the patient's name, date of birth, medical record number, correct site of surgery and correct procedure to be performed. It was also used to verify the patient received antibiotics, and that all appropriate instruments, implants and radiographic studies were available in the room. Once all in attendance were in agreement, the case began.  The right lower extremity was exsanguinated with an Esmarch. The tourniquet was inflated to 275 mmHg. This was applied for a total of 88 minutes. A lateral incision was made over the fibula. The subcutaneous tissues were dissected with the Metzenbaum scissor and pickup. Care was taken to avoid injury to the superficial peroneal nerve. The lateral malleolus fracture was identified and irrigated and fracture hematoma was removed. Soft tissue was removed from the fracture site using a periosteal elevator. A fracture reduction clamp was then used to reduce the fracture to an anatomic position.     The lateral malleolus was then drilled in an AP direction, perpendicular to the fracture site to allow for placement of the lag screw.   A single lag screw was advanced across the fracture site by hand. This compressed the fracture.   A 8 hole, 1/3 tubular plate was then contoured and placed along the lateral fibula. Bicortical screws were placed proximal to the fracture and fully threaded cancellus screws were placed distal the fracture. The fracture reduction and hardware placement were confirmed on AP and lateral imaging.  Once the lateral malleolus was plated, the attention was turned to the medial ankle. A small vertical incision was made over the tip of the medial malleolus.  Soft tissue was dissected with some with the Metzenbaum scissor and pickup. The  fracture of the medial malleolus was identified. This was reduced manually. And 2 threaded K wires for the 4.0 cannulated screws were then advanced through the tip of the medial malleolus across the  fracture site and into the distal tibia. The position of the K wires was evaluated on AP and lateral FluoroScan images. The length of the wires were measured with a depth gauge and were determined to be 60 mm in length. The wires were then overdrilled with a cannulated drill for the 4.0 cannulated screws. The long threaded 4.0 cannulated screws were then advanced into position by hand, compressing the medial malleolus fracture.    A stress test of the right ankle was then performed under fluoroscopy.  This test did not reveal any syndesmotic injury or opening of the medial clear space.  The medial and lateral incisions were then copiously irrigated. The subcutaneous tissue was closed with 2-0 Vicryl and the skin approximated staples. A dry sterile dressing was applied along with an AO splint. The patient's ankle was positioned in neutral. FluoroScan images of the right knee were taken due to the patient complaining of right knee pain. This revealed a nondisplaced oblique fracture of the proximal fibula without disruption of the proximal tib-fib joint. Patient had degenerative changes in the right knee as well. No further intervention was necessary for the fibular fracture.  The pateint was then awoken from anesthesia, transferred to hospital bed and brought to the PACU in stable condition. I was scrubbed and present the entire case and all sharp and instrument counts were correct at conclusion the case. I spoke to the patient's daughter by phone to let her know the case was performed without complication and the patient was stable in recovery room.    Timoteo Gaul, MD

## 2015-10-30 LAB — BASIC METABOLIC PANEL
Anion gap: 9 (ref 5–15)
BUN: 14 mg/dL (ref 6–20)
CALCIUM: 8.3 mg/dL — AB (ref 8.9–10.3)
CHLORIDE: 104 mmol/L (ref 101–111)
CO2: 24 mmol/L (ref 22–32)
CREATININE: 0.98 mg/dL (ref 0.44–1.00)
GFR calc Af Amer: >60 mL/min (ref >60)
GFR calc non Af Amer: 53 mL/min — ABNORMAL LOW (ref >60)
Glucose, Bld: 173 mg/dL — ABNORMAL HIGH (ref 65–99)
Potassium: 4.4 mmol/L (ref 3.5–5.1)
SODIUM: 137 mmol/L (ref 135–145)

## 2015-10-30 LAB — CBC
HCT: 30.6 % — ABNORMAL LOW (ref 35.0–47.0)
HEMOGLOBIN: 10.1 g/dL — AB (ref 12.0–16.0)
MCH: 28.7 pg (ref 26.0–34.0)
MCHC: 33 g/dL (ref 32.0–36.0)
MCV: 87.1 fL (ref 80.0–100.0)
PLATELETS: 183 10*3/uL (ref 150–440)
RBC: 3.51 MIL/uL — ABNORMAL LOW (ref 3.80–5.20)
RDW: 14.5 % (ref 11.5–14.5)
WBC: 11.7 10*3/uL — AB (ref 3.6–11.0)

## 2015-10-30 LAB — GLUCOSE, CAPILLARY
GLUCOSE-CAPILLARY: 157 mg/dL — AB (ref 65–99)
GLUCOSE-CAPILLARY: 244 mg/dL — AB (ref 65–99)
GLUCOSE-CAPILLARY: 242 mg/dL — AB (ref 65–99)
GLUCOSE-CAPILLARY: 228 mg/dL — AB (ref 65–99)
Glucose-Capillary: 173 mg/dL — ABNORMAL HIGH (ref 65–99)
Glucose-Capillary: 245 mg/dL — ABNORMAL HIGH (ref 65–99)

## 2015-10-30 MED ORDER — INSULIN ASPART 100 UNIT/ML ~~LOC~~ SOLN
0.0000 [IU] | Freq: Three times a day (TID) | SUBCUTANEOUS | Status: DC
  Administered 2015-10-30 (×2): 5 [IU] via SUBCUTANEOUS
  Administered 2015-10-31: 8 [IU] via SUBCUTANEOUS
  Administered 2015-10-31 (×2): 3 [IU] via SUBCUTANEOUS
  Administered 2015-11-01: 8 [IU] via SUBCUTANEOUS
  Administered 2015-11-01: 3 [IU] via SUBCUTANEOUS
  Filled 2015-10-30: qty 3
  Filled 2015-10-30: qty 5
  Filled 2015-10-30: qty 3
  Filled 2015-10-30: qty 8
  Filled 2015-10-30: qty 5
  Filled 2015-10-30: qty 8
  Filled 2015-10-30: qty 3

## 2015-10-30 MED ORDER — INSULIN ASPART 100 UNIT/ML ~~LOC~~ SOLN
0.0000 [IU] | Freq: Every day | SUBCUTANEOUS | Status: DC
  Administered 2015-10-30: 2 [IU] via SUBCUTANEOUS
  Filled 2015-10-30: qty 2

## 2015-10-30 NOTE — Progress Notes (Signed)
PT Cancellation Note  Patient Details Name: Diane Mullins MRN: [id number] DOB: 07/17/1933   Cancelled Treatment:    Reason Eval/Treat Not Completed: Patient not medically ready. Evaluation attempted at 1015. Subjective gathered; objective deferred due to increased nausea/vomiting. Will check back this afternoon to complete evaluation.   Dorice Lamas, PT, DPT 10/30/2015, 10:48 AM

## 2015-10-30 NOTE — Clinical Social Work Placement (Signed)
   CLINICAL SOCIAL WORK PLACEMENT  NOTE  Date:  10/30/2015  Patient Details  Name: Diane Mullins MRN: [id number] Date of Birth: 07/04/1933  Clinical Social Work is seeking post-discharge placement for this patient at the   level of care (*CSW will initial, date and re-position this form in  chart as items are completed):  Yes   Patient/family provided with [facility]'s list of facilities offering this level of care within the geographic area requested by the patient (or if unable, by the patient's family).  Yes   Patient/family informed of their freedom to choose among providers that offer the needed level of care, that participate in Medicare, Medicaid or managed care program needed by the patient, have an available bed and are willing to accept the patient.  Yes   Patient/family informed of [facility]'s ownership interest in [facility] and [facility], as well as of the fact that they are under no obligation to receive care at these facilities.  PASRR submitted to EDS on 10/30/15     PASRR number received on       Existing PASRR number confirmed on       FL2 transmitted to all facilities in geographic area requested by pt/family on 10/30/15     FL2 transmitted to all facilities within larger geographic area on       Patient informed that his/her managed care company has contracts with or will negotiate with certain facilities, including the following:            Patient/family informed of bed offers received.  Patient chooses bed at       Physician recommends and patient chooses bed at      Patient to be transferred to   on  .  Patient to be transferred to facility by       Patient family notified on   of transfer.  Name of family member notified:        PHYSICIAN       Additional Comment:    _______________________________________________ Darden Dates, LCSW 10/30/2015, 8:55 PM

## 2015-10-30 NOTE — NC FL2 (Signed)
[place] LEVEL OF CARE SCREENING TOOL     IDENTIFICATION  Patient Name: Diane Mullins Birthdate: [birth year] Sex: female Admission Date (Current Location): 10/28/2015  [place] and [place] Number:  Engineering geologist and Address:  [facility], [address], [place], [place] [address]      Provider Number: [id number]  Attending Physician Name and Address:  Henreitta Leber, MD  Relative Name and Phone Number:       Current Level of Care: Hospital Recommended Level of Care: [place] Prior Approval Number:    Date Approved/Denied:   PASRR Number:    Discharge Plan: SNF    Current Diagnoses: Patient Active Problem List   Diagnosis Date Noted  . Closed right ankle fracture 10/28/2015  . Fatigue 10/18/2015  . TIA (transient ischemic attack) 09/18/2015  . Orthostatic hypotension 08/02/2015  . Right shoulder injury 08/02/2015  . Adjustment disorder with anxious mood 03/30/2015  . Benign paroxysmal positional vertigo 07/17/2014  . Postmenopausal estrogen deficiency 06/03/2014  . DNR (do not resuscitate) discussion 03/04/2014  . Gait disturbance 05/07/2013  . Type 2 diabetes mellitus, uncontrolled ([place]) 04/21/2013  . Tachycardia 01/23/2013  . Medicare annual wellness visit, subsequent 12/09/2012  . Exertional dyspnea 12/09/2012  . Hearing loss 12/09/2012  . Anxiety 01/31/2012  . Hypertension 12/22/2011  . Hyperlipidemia 12/22/2011  . Hypothyroidism 12/22/2011    Orientation RESPIRATION BLADDER Height & Weight     Self, Time, Situation, Place  O2 (2L) Continent Weight: 207 lb (93.895 kg) Height:  5\' 1"  (154.9 cm)  BEHAVIORAL SYMPTOMS/MOOD NEUROLOGICAL BOWEL NUTRITION STATUS      Continent Diet (Soft Diet)  AMBULATORY STATUS COMMUNICATION OF NEEDS Skin   Extensive Assist   Normal                       Personal Care Assistance Level of Assistance  Bathing, Dressing Bathing Assistance: Maximum  assistance Feeding assistance: Independent Dressing Assistance: Maximum assistance     Functional Limitations Info  Sight, Hearing, Speech Sight Info: Adequate Hearing Info: Impaired (Hearing Aids) Speech Info: Adequate    SPECIAL CARE FACTORS FREQUENCY  PT (By licensed PT), OT (By licensed OT)     PT Frequency: 5 OT Frequency: 5            Contractures      Additional Factors Info  Allergies, Code Status, Insulin Sliding Scale Code Status Info: Full Code Allergies Info: No known Allergies   Insulin Sliding Scale Info: 4x/day average       Current Medications (10/30/2015):  This is the current hospital active medication list Current Facility-Administered Medications  Medication Dose Route Frequency Provider Last Rate Last Dose  . 0.9 %  sodium chloride infusion  75 mL/hr Intravenous Continuous Thornton Park, MD 75 mL/hr at 10/30/15 0041 75 mL/hr at 10/30/15 0041  . acetaminophen (TYLENOL) tablet 650 mg  650 mg Oral Q6H PRN Thornton Park, MD       Or  . acetaminophen (TYLENOL) suppository 650 mg  650 mg Rectal Q6H PRN Thornton Park, MD      . ALPRAZolam Duanne Moron) tablet 0.25 mg  0.25 mg Oral TID PRN Henreitta Leber, MD      . alum & mag hydroxide-simeth (MAALOX/MYLANTA) 200-200-20 MG/5ML suspension 30 mL  30 mL Oral Q4H PRN Thornton Park, MD      . bisacodyl (DULCOLAX) suppository 10 mg  10 mg Rectal Daily PRN Thornton Park, MD      .  calcium-vitamin D (OSCAL WITH D) 500-200 MG-UNIT per tablet 0.5 tablet  0.5 tablet Oral Daily Henreitta Leber, MD   0.5 tablet at 10/30/15 0956  . cholecalciferol (VITAMIN D) tablet 1,000 Units  1,000 Units Oral Daily Henreitta Leber, MD   1,000 Units at 10/30/15 0955  . docusate sodium (COLACE) capsule 100 mg  100 mg Oral BID Thornton Park, MD   100 mg at 10/30/15 0955  . enoxaparin (LOVENOX) injection 40 mg  40 mg Subcutaneous Q24H Thornton Park, MD   40 mg at 10/30/15 0955  . furosemide (LASIX) tablet 20 mg  20 mg Oral Daily  Henreitta Leber, MD   20 mg at 10/30/15 0956  . insulin aspart (novoLOG) injection 0-15 Units  0-15 Units Subcutaneous TID WC Henreitta Leber, MD   5 Units at 10/30/15 1756  . insulin aspart (novoLOG) injection 0-5 Units  0-5 Units Subcutaneous QHS Henreitta Leber, MD      . levothyroxine (SYNTHROID, LEVOTHROID) tablet 75 mcg  75 mcg Oral QAC breakfast Henreitta Leber, MD   75 mcg at 10/29/15 1018  . losartan (COZAAR) tablet 50 mg  50 mg Oral Daily Henreitta Leber, MD   50 mg at 10/30/15 0956  . magnesium citrate solution 1 Bottle  1 Bottle Oral Once PRN Thornton Park, MD      . menthol-cetylpyridinium (CEPACOL) lozenge 3 mg  1 lozenge Oral PRN Thornton Park, MD       Or  . phenol (CHLORASEPTIC) mouth spray 1 spray  1 spray Mouth/Throat PRN Thornton Park, MD      . metoprolol succinate (TOPROL-XL) 24 hr tablet 100 mg  100 mg Oral Daily Henreitta Leber, MD   100 mg at 10/30/15 0955  . morphine 2 MG/ML injection 2 mg  2 mg Intravenous Q2H PRN Thornton Park, MD   2 mg at 10/30/15 0457  . multivitamin with minerals tablet 1 tablet  1 tablet Oral Daily Henreitta Leber, MD   1 tablet at 10/30/15 0955  . omega-3 acid ethyl esters (LOVAZA) capsule 1 g  1 g Oral BID Henreitta Leber, MD   1 g at 10/30/15 0955  . ondansetron (ZOFRAN) tablet 4 mg  4 mg Oral Q6H PRN Thornton Park, MD       Or  . ondansetron [facility]) injection 4 mg  4 mg Intravenous Q6H PRN Thornton Park, MD   4 mg at 10/30/15 1757  . oxyCODONE (Oxy IR/ROXICODONE) immediate release tablet 10-15 mg  10-15 mg Oral Q3H PRN Thornton Park, MD   10 mg at 10/30/15 1757  . pantoprazole (PROTONIX) EC tablet 40 mg  40 mg Oral Daily Henreitta Leber, MD   40 mg at 10/30/15 0956  . polyethylene glycol (MIRALAX / GLYCOLAX) packet 17 g  17 g Oral Daily PRN Thornton Park, MD      . senna [facility]) tablet 8.6 mg  1 tablet Oral BID Thornton Park, MD   8.6 mg at 10/30/15 0955  . simvastatin (ZOCOR) tablet 20 mg  20 mg Oral QPM Henreitta Leber,  MD   20 mg at 10/30/15 1758     Discharge Medications: Please see discharge summary for a list of discharge medications.  Relevant Imaging Results:  Relevant Lab Results:   Additional Information SSN:  [id number]  Darden Dates, LCSW

## 2015-10-30 NOTE — Progress Notes (Signed)
[place] at [place] NAME: Diane Mullins    MR#:  [id number]  DATE OF BIRTH:  13-Jul-1933  SUBJECTIVE:   Pt. Here w/ a fall and noted to have a right ankle fracture. Status post open reduction internal fixation of right bimalleolar ankle fracture POD # 1.  More awake and alert today.  Having some N/V this a.m.   REVIEW OF SYSTEMS:    Review of Systems  Constitutional: Negative for fever and chills.  HENT: Negative for congestion and tinnitus.   Eyes: Negative for blurred vision and double vision.  Respiratory: Negative for cough, shortness of breath and wheezing.   Cardiovascular: Negative for chest pain, orthopnea and PND.  Gastrointestinal: Positive for nausea and vomiting. Negative for abdominal pain and diarrhea.  Genitourinary: Negative for dysuria and hematuria.  Neurological: Negative for dizziness, sensory change and focal weakness.  All other systems reviewed and are negative.   Nutrition: Clear liquid Tolerating Diet: yes Tolerating PT: Await Eval.    DRUG ALLERGIES:  No Known Allergies  VITALS:  Blood pressure 127/61, pulse 59, temperature 98.4 F (36.9 C), temperature source Oral, resp. rate 16, height 5\' 1"  (1.549 m), weight 93.895 kg (207 lb), SpO2 100 %.  PHYSICAL EXAMINATION:   Physical Exam  GENERAL:  82 y.o.-year-old patient lying in the bed feeling a bit nauseated.   EYES: Pupils equal, round, reactive to light and accommodation. No scleral icterus. Extraocular muscles intact.  HEENT: Head atraumatic, normocephalic. Oropharynx and nasopharynx clear.  NECK:  Supple, no jugular venous distention. No thyroid enlargement, no tenderness.  LUNGS: Normal breath sounds bilaterally, no wheezing, rales, rhonchi. No use of accessory muscles of respiration.  CARDIOVASCULAR: S1, S2 normal. No murmurs, rubs, or gallops.  ABDOMEN: Soft, nontender, slightly distended. Bowel sounds present. No organomegaly or mass.   EXTREMITIES: No cyanosis, clubbing or edema b/l.   Right Leg in cast post ankle surgery.  NEUROLOGIC: Cranial nerves II through XII are intact. No focal Motor or sensory deficits b/l.   PSYCHIATRIC: AAO X 3.  Good affect.  SKIN: No obvious rash, lesion, or ulcer.    LABORATORY PANEL:   CBC  Recent Labs Lab 10/30/15 0521  WBC 11.7*  HGB 10.1*  HCT 30.6*  PLT 183   ------------------------------------------------------------------------------------------------------------------  Chemistries   Recent Labs Lab 10/30/15 0521  NA 137  K 4.4  CL 104  CO2 24  GLUCOSE 173*  BUN 14  CREATININE 0.98  CALCIUM 8.3*   ------------------------------------------------------------------------------------------------------------------  Cardiac Enzymes No results for input(s): TROPONINI in the last 168 hours. ------------------------------------------------------------------------------------------------------------------  RADIOLOGY:  Dg Knee 1-2 Views Right  10/29/2015  CLINICAL DATA:  Acute right knee pain following injury yesterday. Initial encounter. EXAM: RIGHT KNEE - 1-2 VIEW COMPARISON:  04/01/2014 FINDINGS: A minimally comminuted proximal fibular fracture is identified with 3 mm lateral displacement. Moderate -severe tricompartmental degenerative changes are noted. There is no evidence of subluxation or dislocation. IMPRESSION: Minimally comminuted proximal fibular fracture. Moderate -severe tricompartmental degenerative changes. Electronically Signed   By: Margarette Canada M.D.   On: 10/29/2015 15:52   Dg Ankle Right Port  10/29/2015  CLINICAL DATA:  ORIF right ankle fracture EXAM: PORTABLE RIGHT ANKLE - 2 VIEW COMPARISON:  None. FINDINGS: Oblique distal fibular fracture transfixed with a lateral sideplate and multiple interlocking screws. Two transmalleolar lag screws transfixing the medial malleolar fracture. Ankle mortise is intact. Alignment is near anatomic. Postsurgical changes  in the surrounding soft tissues. IMPRESSION: ORIF right  ankle bimalleolar fractures. Electronically Signed   By: Kathreen Devoid   On: 10/29/2015 15:50   Dg Ankle Right Port  10/28/2015  CLINICAL DATA:  Status post reduction of right ankle fractures and dislocation EXAM: PORTABLE RIGHT ANKLE - 2 VIEW COMPARISON:  10/28/2015 FINDINGS: There is been reduction of the fracture dislocation of the right ankle. There remain some displacement of the medial malleolar fragment and mild lateral subluxation of the talus with respect to the tibia although significant improvement is noted. IMPRESSION: Some improvement in the degree of fracture dislocation following reduction. There remains some lateral displacement of the talus with respect to the tibiotalar joint. Electronically Signed   By: Inez Catalina M.D.   On: 10/28/2015 13:44     ASSESSMENT AND PLAN:   82 year old female with past medical history of hypertension, hyperlipidemia, diabetes, hypothyroidism, osteoarthritis who presents to the hospital after a mechanical fall and noted to have a right ankle fracture.   #1 right ankle fracture-status post open reduction internal fixation of the right bimalleolar fracture POD # 1. -Continue pain control, physical therapy as tolerated. Continue care as per orthopedics.  #2 diabetes type 2 without complication-continue sliding scale insulin. -Hold glimepiride, metformin for now.  #3 N/V - due to pain meds.  Cont. Supportive care with anti-emetics.   #4 hypertension-continue losartan, metoprolol.  #5 hypothyroidism-continue Synthroid.  #6 hyperlipidemia-continue simvastatin.  #7 history of previous TIA- cont. ASA, Statin.   #8 GERD-continue Protonix.  Await physical therapy evaluation and patient will likely need short-term rehabilitation.  All the records are reviewed and case discussed with Care Management/Social Workerr. Management plans discussed with the patient, family and they are in  agreement.  CODE STATUS: Full  DVT Prophylaxis: Lovenox  TOTAL TIME TAKING CARE OF THIS PATIENT: 25 minutes.   POSSIBLE D/C IN 2-3 DAYS, DEPENDING ON CLINICAL CONDITION.   Henreitta Leber M.D on 10/30/2015 at 1:19 PM  Between 7am to 6pm - Pager - [phone]  After 6pm go to [email or web address] - password [email or web address] [place] [facility]  Office  [phone]  CC: Primary care physician; Rica Mast, MD

## 2015-10-30 NOTE — Progress Notes (Signed)
Subjective:  Postop day #1 status post ORIF for right bimalleolar ankle fracture. Patient reports right ankle pain as mild.  Patient has had nausea this morning. She denies shortness of breath chest pain. Patient's daughter is at the bedside. She was unable to perform physical therapy this morning due to her nausea.  Objective:   VITALS:   Filed Vitals:   10/29/15 2142 10/30/15 0442 10/30/15 0754 10/30/15 1204  BP: 130/49 156/65 124/53 127/61  Pulse: 54 71 60 59  Temp: 99.4 F (37.4 C) 98.9 F (37.2 C) 98.4 F (36.9 C)   TempSrc: Axillary Axillary Oral   Resp: 18 18 16 16   Height:      Weight:      SpO2: 100% 97% 95% 100%    PHYSICAL EXAM:  Right lower extremity: Patient's dressing and splint are clean dry and intact. She has intact sensation to light touch in the right foot. Her foot is well perfused. She can flex and extend her toes without pain. Her right lower extremity is elevated on pillows.   LABS  Results for orders placed or performed during the hospital encounter of 10/28/15 (from the past 24 hour(s))  Glucose, capillary     Status: Abnormal   Collection Time: 10/30/15  5:04 AM  Result Value Ref Range   Glucose-Capillary 157 (H) 65 - 99 mg/dL  CBC     Status: Abnormal   Collection Time: 10/30/15  5:21 AM  Result Value Ref Range   WBC 11.7 (H) 3.6 - 11.0 K/uL   RBC 3.51 (L) 3.80 - 5.20 MIL/uL   Hemoglobin 10.1 (L) 12.0 - 16.0 g/dL   HCT 30.6 (L) 35.0 - 47.0 %   MCV 87.1 80.0 - 100.0 fL   MCH 28.7 26.0 - 34.0 pg   MCHC 33.0 32.0 - 36.0 g/dL   RDW 14.5 11.5 - 14.5 %   Platelets 183 150 - 440 K/uL  Basic metabolic panel     Status: Abnormal   Collection Time: 10/30/15  5:21 AM  Result Value Ref Range   Sodium 137 135 - 145 mmol/L   Potassium 4.4 3.5 - 5.1 mmol/L   Chloride 104 101 - 111 mmol/L   CO2 24 22 - 32 mmol/L   Glucose, Bld 173 (H) 65 - 99 mg/dL   BUN 14 6 - 20 mg/dL   Creatinine, Ser 0.98 0.44 - 1.00 mg/dL   Calcium 8.3 (L) 8.9 - 10.3 mg/dL   GFR calc non Af Amer 53 (L) >60 mL/min   GFR calc Af Amer >60 >60 mL/min   Anion gap 9 5 - 15  Glucose, capillary     Status: Abnormal   Collection Time: 10/30/15  7:55 AM  Result Value Ref Range   Glucose-Capillary 173 (H) 65 - 99 mg/dL   Comment 1 Notify RN   Glucose, capillary     Status: Abnormal   Collection Time: 10/30/15  9:48 AM  Result Value Ref Range   Glucose-Capillary 245 (H) 65 - 99 mg/dL    Dg Knee 1-2 Views Right  10/29/2015  CLINICAL DATA:  Acute right knee pain following injury yesterday. Initial encounter. EXAM: RIGHT KNEE - 1-2 VIEW COMPARISON:  04/01/2014 FINDINGS: A minimally comminuted proximal fibular fracture is identified with 3 mm lateral displacement. Moderate -severe tricompartmental degenerative changes are noted. There is no evidence of subluxation or dislocation. IMPRESSION: Minimally comminuted proximal fibular fracture. Moderate -severe tricompartmental degenerative changes. Electronically Signed   By: Cleatis Polka.D.  On: 10/29/2015 15:52   Dg Ankle Right Port  10/29/2015  CLINICAL DATA:  ORIF right ankle fracture EXAM: PORTABLE RIGHT ANKLE - 2 VIEW COMPARISON:  None. FINDINGS: Oblique distal fibular fracture transfixed with a lateral sideplate and multiple interlocking screws. Two transmalleolar lag screws transfixing the medial malleolar fracture. Ankle mortise is intact. Alignment is near anatomic. Postsurgical changes in the surrounding soft tissues. IMPRESSION: ORIF right ankle bimalleolar fractures. Electronically Signed   By: Kathreen Devoid   On: 10/29/2015 15:50   Dg Ankle Right Port  10/28/2015  CLINICAL DATA:  Status post reduction of right ankle fractures and dislocation EXAM: PORTABLE RIGHT ANKLE - 2 VIEW COMPARISON:  10/28/2015 FINDINGS: There is been reduction of the fracture dislocation of the right ankle. There remain some displacement of the medial malleolar fragment and mild lateral subluxation of the talus with respect to the tibia although  significant improvement is noted. IMPRESSION: Some improvement in the degree of fracture dislocation following reduction. There remains some lateral displacement of the talus with respect to the tibiotalar joint. Electronically Signed   By: Inez Catalina M.D.   On: 10/28/2015 13:44    Assessment/Plan: 1 Day Post-Op   Active Problems:   Closed right ankle fracture  Patient is stable postop. She should receive Zofran or Phenergan for her nausea. Patient will participate with physical therapy as tolerated. She is nonweightbearing on the right lower extremity. Patient is a fall risk. Lovenox to start today. Foley to be removed today. Encourage incentive spirometry while awake. Patient will complete 24 hours of antibiotics today.   Thornton Park , MD 10/30/2015, 12:39 PM

## 2015-10-30 NOTE — Progress Notes (Signed)
Physical Therapy Evaluation Patient Details Name: Diane Mullins MRN: [id number] DOB: [birth year] Today's Date: 10/30/2015   History of Present Illness  Patient is an 82 y.o. female admitted on 09 Feb. after fall with R ankle fx. ORIF of bimalleolar fx performed on 10 Feb. PMH includes HTN, HLD, DM, and hypothyroidism.  Clinical Impression  Patient able to better tolerate PT upon second attempt this afternoon. Patient previously reportedly independent with occasional infrequent use of RW/rollator, living alone with daughter/grandchildren in area. Patient now has impairments of pain, gait, ROM, muscle strength, and function. On evaluation, patient able to get to EOB with moderate assistance but unable to fully perform sit to stand transfer without LOB (no fall). Patient will benefit from continued PT services to strengthen B UEs/L LE due to weight bearing restrictions on R to allow her to ambulate and maintain precautions. At d/c it is believed patient will benefit from continued PT services at SNF due to increased dependence and lack of caregiver support at home.    Follow Up Recommendations SNF    Equipment Recommendations  None recommended by PT    Recommendations for Other Services       Precautions / Restrictions Precautions Precautions: Fall Restrictions Weight Bearing Restrictions: Yes RLE Weight Bearing: Non weight bearing      Mobility  Bed Mobility Overal bed mobility: Needs Assistance Bed Mobility: Supine to Sit;Sit to Supine     Supine to sit: Mod assist;HOB elevated Sit to supine: Max assist   General bed mobility comments: Patient requires increased time to move from supine to sit with verbal cues to not posterior lean. Frequently shouting due to pain in R ankle. Once at EOB, able to scoot her bottom pushing through L LE. Sit to supine transfer required maximal assistance as patient leaned backwards. Was able to use UEs and rails to assist with scooting towards  HOB.  Transfers Overall transfer level: Needs assistance Equipment used: Rolling walker (2 wheeled) Transfers: Sit to/from Stand Sit to Stand: Max assist;From elevated surface         General transfer comment: Patient unable to complete sit to stand transfer x5 attempts. Was able to move ~65% of way into standing but moved quickly back to bed with LOB and no fall. Has tendency to scoot forward instead of pushing through LE to stand.  Ambulation/Gait                Stairs            Wheelchair Mobility    Modified Rankin (Stroke Patients Only)       Balance Overall balance assessment: Needs assistance Sitting-balance support: Bilateral upper extremity supported Sitting balance-Leahy Scale: Fair     Standing balance support: Bilateral upper extremity supported Standing balance-Leahy Scale: Zero                               Pertinent Vitals/Pain Pain Assessment: 0-10 Pain Score: 5  Pain Location: R ankle Pain Descriptors / Indicators: Aching Pain Intervention(s): Limited activity within patient's tolerance;Monitored during session;Premedicated before session;Relaxation    Home Living Family/patient expects to be discharged to:: Private residence Living Arrangements: Alone Available Help at Discharge: Family;Available PRN/intermittently Type of Home: House Home Access: Stairs to enter Entrance Stairs-Rails: Left Entrance Stairs-Number of Steps: 3 Home Layout: One level Home Equipment: Walker - 2 wheels;Walker - 4 wheels      Prior Function Level of Independence: Independent  Comments: Patient reports having RW and rollator but not using them. Performs some of her own shopping.     Hand Dominance        Extremity/Trunk Assessment   Upper Extremity Assessment: Overall WFL for tasks assessed           Lower Extremity Assessment: RLE deficits/detail RLE Deficits / Details: Patient R ankle immobilized in ACE wrap;  able to move toes without difficulty       Communication   Communication: No difficulties  Cognition Arousal/Alertness: Awake/alert Behavior During Therapy: WFL for tasks assessed/performed Overall Cognitive Status: Within Functional Limits for tasks assessed                      General Comments      Exercises        Assessment/Plan    PT Assessment Patient needs continued PT services  PT Diagnosis Difficulty walking;Generalized weakness;Acute pain   PT Problem List Decreased strength;Decreased range of motion;Decreased activity tolerance;Decreased balance;Decreased mobility;Decreased knowledge of use of DME;Decreased safety awareness;Pain;Decreased knowledge of precautions  PT Treatment Interventions DME instruction;Gait training;Stair training;Functional mobility training;Therapeutic activities;Therapeutic exercise;Balance training;Patient/family education   PT Goals (Current goals can be found in the Care Plan section) Acute Rehab PT Goals Patient Stated Goal: "To get rid of pain." PT Goal Formulation: With patient Time For Goal Achievement: 11/13/15 Potential to Achieve Goals: Fair    Frequency BID   Barriers to discharge Inaccessible home environment;Decreased caregiver support      Co-evaluation               End of Session Equipment Utilized During Treatment: Gait belt;Oxygen Activity Tolerance: Patient limited by pain;Patient limited by fatigue Patient left: in bed;with call bell/phone within reach;with bed alarm set;with SCD's reapplied           Time: 1425-1452 PT Time Calculation (min) (ACUTE ONLY): 27 min   Charges:   PT Evaluation $PT Eval Low Complexity: 1 Procedure     PT G Codes:        Dorice Lamas, PT, DPT 10/30/2015, 3:04 PM

## 2015-10-30 NOTE — Clinical Social Work Note (Signed)
Clinical Social Work Assessment  Patient Details  Name: Diane Mullins MRN: [id number] Date of Birth: [birth year]  Date of referral:  10/30/15               Reason for consult:  Facility Placement                Permission sought to share information with:  Family Supports Permission granted to share information::  Yes, Verbal Permission Granted  Name::     [name], daughter   Housing/Transportation Living arrangements for the past 2 months:  Single Family Home Source of Information:  Patient Patient Interpreter Needed:  None Criminal Activity/Legal Involvement Pertinent to Current Situation/Hospitalization:  No - Comment as needed Significant Relationships:  Adult Children, Other Family Members Lives with:  Self Do you feel safe going back to the place where you live?  Yes Need for family participation in patient care:  No (Coment)  Care giving concerns:  Pt lives alone.   Social Worker assessment / plan:  CSW met with pt to address consult for New SNF. PT is recommending SNF for STR. CSW introduced herself and explained role of social work. CSW also explained process of discharging to SNF with a managed medicare. Pt lives alone and has supportive family. Pt's daughter is aware that pt is in the hospital, however declined to have CSW update pt's daughter. CSW initiated SNF search and will follow up with bed offers. CSW will continue to follow.   Employment status:  Retired Nurse, adult PT Recommendations:  [place] / Referral to community resources:  Other (Comment Required)  Patient/Family's Response to care:  Pt was appreciative of CSW support.   Patient/Family's Understanding of and Emotional Response to Diagnosis, Current Treatment, and Prognosis:  Pt understands that she will require STR at SNF prior to returning home.   Emotional Assessment Appearance:  Appears stated age Attitude/Demeanor/Rapport:   (Appropriate) Affect  (typically observed):  Pleasant Orientation:  Oriented to Self, Oriented to Place, Oriented to  Time Alcohol / Substance use:  Never Used Psych involvement (Current and /or in the community):     Discharge Needs  Concerns to be addressed:  Adjustment to Illness Readmission within the last 30 days:  No Current discharge risk:  Lives alone Barriers to Discharge:  No Barriers Identified   Darden Dates, LCSW 10/30/2015, 8:56 PM

## 2015-10-31 LAB — CBC
HCT: 28.6 % — ABNORMAL LOW (ref 35.0–47.0)
Hemoglobin: 9.6 g/dL — ABNORMAL LOW (ref 12.0–16.0)
MCH: 28.9 pg (ref 26.0–34.0)
MCHC: 33.4 g/dL (ref 32.0–36.0)
MCV: 86.4 fL (ref 80.0–100.0)
PLATELETS: 185 10*3/uL (ref 150–440)
RBC: 3.31 MIL/uL — AB (ref 3.80–5.20)
RDW: 14.4 % (ref 11.5–14.5)
WBC: 9.6 10*3/uL (ref 3.6–11.0)

## 2015-10-31 LAB — GLUCOSE, CAPILLARY
GLUCOSE-CAPILLARY: 152 mg/dL — AB (ref 65–99)
GLUCOSE-CAPILLARY: 197 mg/dL — AB (ref 65–99)
Glucose-Capillary: 199 mg/dL — ABNORMAL HIGH (ref 65–99)
Glucose-Capillary: 268 mg/dL — ABNORMAL HIGH (ref 65–99)
Glucose-Capillary: 213 mg/dL — ABNORMAL HIGH (ref 65–99)

## 2015-10-31 LAB — BASIC METABOLIC PANEL
Anion gap: 7 (ref 5–15)
BUN: 14 mg/dL (ref 6–20)
CALCIUM: 8.4 mg/dL — AB (ref 8.9–10.3)
CO2: 29 mmol/L (ref 22–32)
Chloride: 101 mmol/L (ref 101–111)
Creatinine, Ser: 0.89 mg/dL (ref 0.44–1.00)
GFR calc Af Amer: >60 mL/min (ref >60)
GFR, EST NON AFRICAN AMERICAN: 59 mL/min — AB (ref >60)
GLUCOSE: 158 mg/dL — AB (ref 65–99)
POTASSIUM: 4 mmol/L (ref 3.5–5.1)
SODIUM: 137 mmol/L (ref 135–145)

## 2015-10-31 MED ORDER — ASPIRIN 325 MG PO TABS
325.0000 mg | ORAL_TABLET | Freq: Every day | ORAL | Status: DC
  Administered 2015-10-31 – 2015-11-01 (×2): 325 mg via ORAL
  Filled 2015-10-31 (×3): qty 1

## 2015-10-31 NOTE — Progress Notes (Signed)
Subjective:  Postoperative day #2 status post ORIF of right bimalleolar ankle fracture. Patient reports pain as mild.  She is up out of bed to a chair. Her right leg is elevated. She feels better today than she did yesterday. She denies any nausea today.  Objective:   VITALS:   Filed Vitals:   10/30/15 1543 10/30/15 1939 10/31/15 0459 10/31/15 0752  BP: 113/45 106/44 116/46 124/53  Pulse: 59 57 77 69  Temp: 98.3 F (36.8 C) 98.1 F (36.7 C) 98.4 F (36.9 C) 98.1 F (36.7 C)  TempSrc: Oral Oral Oral Oral  Resp: 16 16 18 16   Height:      Weight:      SpO2: 100% 93% 92% 91%    PHYSICAL EXAM:  Right lower extremity: Patient's dressing and splint are clean dry and intact. She can flex and extend her toes. Her toes are well-perfused. She has intact sensation to light touch in all toes and there is no pain with passive stretch of the toes.   LABS  Results for orders placed or performed during the hospital encounter of 10/28/15 (from the past 24 hour(s))  Glucose, capillary     Status: Abnormal   Collection Time: 10/30/15  3:45 PM  Result Value Ref Range   Glucose-Capillary 244 (H) 65 - 99 mg/dL   Comment 1 Notify RN   Glucose, capillary     Status: Abnormal   Collection Time: 10/30/15  9:07 PM  Result Value Ref Range   Glucose-Capillary 242 (H) 65 - 99 mg/dL   Comment 1 Notify RN   Glucose, capillary     Status: Abnormal   Collection Time: 10/30/15 10:23 PM  Result Value Ref Range   Glucose-Capillary 228 (H) 65 - 99 mg/dL   Comment 1 Notify RN   CBC     Status: Abnormal   Collection Time: 10/31/15  5:41 AM  Result Value Ref Range   WBC 9.6 3.6 - 11.0 K/uL   RBC 3.31 (L) 3.80 - 5.20 MIL/uL   Hemoglobin 9.6 (L) 12.0 - 16.0 g/dL   HCT 28.6 (L) 35.0 - 47.0 %   MCV 86.4 80.0 - 100.0 fL   MCH 28.9 26.0 - 34.0 pg   MCHC 33.4 32.0 - 36.0 g/dL   RDW 14.4 11.5 - 14.5 %   Platelets 185 150 - 440 K/uL  Basic metabolic panel     Status: Abnormal   Collection Time: 10/31/15   5:41 AM  Result Value Ref Range   Sodium 137 135 - 145 mmol/L   Potassium 4.0 3.5 - 5.1 mmol/L   Chloride 101 101 - 111 mmol/L   CO2 29 22 - 32 mmol/L   Glucose, Bld 158 (H) 65 - 99 mg/dL   BUN 14 6 - 20 mg/dL   Creatinine, Ser 0.89 0.44 - 1.00 mg/dL   Calcium 8.4 (L) 8.9 - 10.3 mg/dL   GFR calc non Af Amer 59 (L) >60 mL/min   GFR calc Af Amer >60 >60 mL/min   Anion gap 7 5 - 15  Glucose, capillary     Status: Abnormal   Collection Time: 10/31/15  7:49 AM  Result Value Ref Range   Glucose-Capillary 152 (H) 65 - 99 mg/dL   Comment 1 Notify RN   Glucose, capillary     Status: Abnormal   Collection Time: 10/31/15 11:22 AM  Result Value Ref Range   Glucose-Capillary 199 (H) 65 - 99 mg/dL   Comment 1 Notify RN  Dg Knee 1-2 Views Right  10/29/2015  CLINICAL DATA:  Acute right knee pain following injury yesterday. Initial encounter. EXAM: RIGHT KNEE - 1-2 VIEW COMPARISON:  04/01/2014 FINDINGS: A minimally comminuted proximal fibular fracture is identified with 3 mm lateral displacement. Moderate -severe tricompartmental degenerative changes are noted. There is no evidence of subluxation or dislocation. IMPRESSION: Minimally comminuted proximal fibular fracture. Moderate -severe tricompartmental degenerative changes. Electronically Signed   By: Margarette Canada M.D.   On: 10/29/2015 15:52   Dg Ankle Right Port  10/29/2015  CLINICAL DATA:  ORIF right ankle fracture EXAM: PORTABLE RIGHT ANKLE - 2 VIEW COMPARISON:  None. FINDINGS: Oblique distal fibular fracture transfixed with a lateral sideplate and multiple interlocking screws. Two transmalleolar lag screws transfixing the medial malleolar fracture. Ankle mortise is intact. Alignment is near anatomic. Postsurgical changes in the surrounding soft tissues. IMPRESSION: ORIF right ankle bimalleolar fractures. Electronically Signed   By: Kathreen Devoid   On: 10/29/2015 15:50    Assessment/Plan: 2 Days Post-Op   Active Problems:   Closed right  ankle fracture  Continue to elevate and ice the right lower extremity. Patient will continue physical therapy. She will need skilled nursing facility upon discharge. She is making appropriate progress postoperatively. Continue Lovenox for DVT prophylaxis. Possible discharge to SNF tomorrow.    Thornton Park , MD 10/31/2015, 1:08 PM

## 2015-10-31 NOTE — Evaluation (Signed)
Occupational Therapy Evaluation Patient Details Name: Diane Mullins MRN: [id number] DOB: [birth year] Today's Date: 10/31/2015    History of Present Illness Patient is an 82 y.o. female admitted on 09 Feb. after fall with R ankle fx. ORIF of bimalleolar fx performed on 10 Feb. PMH includes HTN, HLD, DM, and hypothyroidism.   Clinical Impression   Pt is 82 year old female s/p R ORIF of bimalleolar fracture.  Pt was living at home alone and independent in all ADLs prior to surgery and driving and is eager to return to PLOF.  Pt currently requires moderate assist for LB dressing while in seated position due to pain and limited AROM of R LE.  Pt would benefit from instruction in dressing techniques with or without assistive devices for dressing and bathing skills.  Pt would also benefit from recommendations for home modifications to increase safety in the bathroom and prevent falls. Rec SNF for continued rehab since patient lives at home alone and is currently at risk for falls.       Follow Up Recommendations  SNF    Equipment Recommendations  Tub/shower bench (reacher and sock aid, elastic shoe laces)    Recommendations for Other Services       Precautions / Restrictions Precautions Precautions: Fall Restrictions Weight Bearing Restrictions: Yes RLE Weight Bearing: Non weight bearing      Mobility Bed Mobility Overal bed mobility: Needs Assistance Bed Mobility: Supine to Sit     Supine to sit: Min assist;HOB elevated     General bed mobility comments: Patient requires increased time to perform bed mobility with mod VCS for hand placement and LLE placement; She was able to scoot to edge of bed and push up with arms for sitting edge of bed with min A; Patient reports increased RLE discomfort with movement;   Transfers Overall transfer level: Needs assistance Equipment used: Rolling walker (2 wheeled) Transfers: Sit to/from [facility] Sit to Stand: Mod  assist Stand pivot transfers: Max assist       General transfer comment: Patient transferred sit<>Stand from with RW x3 reps (max A progressing to mod A ) She requierd assistance to maintain RLE NWB and mod VCS for hand placement; Patient able to stand with erect posture for a few seconds with minimal fatigue; She transferred stand pivot to bedside chair with max A +1 with cues for pivoting on LLE and to avoid sitting prior to getting bottom to chair; She was weak and had decreased balance during pivot.     Balance Overall balance assessment: Needs assistance Sitting-balance support: Bilateral upper extremity supported Sitting balance-Leahy Scale: Fair     Standing balance support: Bilateral upper extremity supported Standing balance-Leahy Scale: Poor                              ADL Overall ADL's : Needs assistance/impaired                                       General ADL Comments: Pt was willing to work on LB dressing for LLE but not RLE since she just finished with PT and did not want pain to come back in RLE again.  Pt also declined standing for pants over hips this session.  She required mod assist and cues for using reacher and sock aid for LLE.  Pt able  to complete grooming skills and self feeding and UB dressing independently after set up.  Daughter Diane Mullins present who lives in [place] and stated that her sister Diane Mullins is the local family member and point person.  Pt has a jacuzzi tub and will need to purchase a transfer tub bench and hand held shower per daughter but not clear if this will be safet if water sprays everywhere.  Pt felll in shower stall at step when trying to get out of shower but it may be safer to use a transfer tub bench in shower stall.  Will continue to address with pt and family.      Vision     Perception     Praxis      Pertinent Vitals/Pain Pain Assessment: 0-10 Pain Score: 7  Pain Location: R ankle; pain only with  movement and none at rest Pain Descriptors / Indicators: Aching Pain Intervention(s): Limited activity within patient's tolerance;Monitored during session;Premedicated before session     Hand Dominance Right   Extremity/Trunk Assessment Upper Extremity Assessment Upper Extremity Assessment: Overall WFL for tasks assessed   Lower Extremity Assessment Lower Extremity Assessment: Defer to PT evaluation       Communication Communication Communication: No difficulties   Cognition Arousal/Alertness: Awake/alert Behavior During Therapy: WFL for tasks assessed/performed Overall Cognitive Status: Within Functional Limits for tasks assessed                     General Comments       Exercises   Other Exercises Other Exercises: Instructed patient in BLE strengthening exercise: supine in bed: LLE ankle pumps x10; BLE quad sets 3 sec hold x10; RLE SAQ x10, RLE SLR flexion x5; patient required mod VCS and tactile cues to increase ROM For better strengthening: She was hesitant to initiate RLE movement and reports increased pain with advanced ROM but was able to demonstrate quad activation;    Shoulder Instructions      Home Living Family/patient expects to be discharged to:: Private residence Living Arrangements: Alone Available Help at Discharge: Family;Available PRN/intermittently Type of Home: House Home Access: Stairs to enter [employer] of Steps: 3 Entrance Stairs-Rails: Left Home Layout: One level     Bathroom Shower/Tub: Tub/shower unit (jacuzzi tub with no door)   Bathroom Toilet: Standard Bathroom Accessibility: Yes   Home Equipment: Walker - 2 wheels;Walker - 4 wheels          Prior Functioning/Environment Level of Independence: Independent        Comments: Patient reports having RW and rollator but not using them. Performs some of her own shopping. Pt was driving pror to fall.    OT Diagnosis: Generalized weakness;Acute pain   OT Problem  List: Decreased strength;Decreased activity tolerance   OT Treatment/Interventions: Self-care/ADL training;DME and/or AE instruction;Patient/family education    OT Goals(Current goals can be found in the care plan section) Acute Rehab OT Goals Patient Stated Goal: "to get pain under control" OT Goal Formulation: With patient/family Time For Goal Achievement: 11/14/15 Potential to Achieve Goals: Good  OT Frequency: Min 1X/week   Barriers to D/C:    lives at home alone       Co-evaluation              End of Session    Activity Tolerance: Patient tolerated treatment well Patient left: in chair;with chair alarm set   Time: 0940-1010 OT Time Calculation (min): 30 min Charges:  OT General Charges $OT Visit: 1 Procedure OT  Evaluation $OT Eval Low Complexity: 1 Procedure OT Treatments $Self Care/Home Management : 8-22 mins G-Codes:    Wofford,Susan November 07, 2015, 10:45 AM  Chrys Racer, OTR/L ascom [phone]

## 2015-10-31 NOTE — Progress Notes (Signed)
Physical Therapy Treatment Patient Details Name: ELRENE SHOUGH MRN: [id number] DOB: [birth year] Today's Date: 10/31/2015    History of Present Illness Patient is an 82 y.o. female admitted on 09 Feb. after fall with R ankle fx. ORIF of bimalleolar fx performed on 10 Feb. PMH includes HTN, HLD, DM, and hypothyroidism.    PT Comments    Patient instructed in BLE strengthening exercise today; She exhibits increased weakness in RLE with decreased movement against gravity. However,she was able to initiate quad muscle activation. Patient hesitant to move RLE due to fear of pain; She progressed well with bed mobility being min A for supine to sitting edge of bed. She does require increased cues for hand placement and technique. Patient also able to progress with sit<>Stand transfers to mod A +1 with RW from bed. She is max A for stand pivot to bedside chair. Patient would benefit from additional skilled PT Intervention to improve functional mobility and strength.   Follow Up Recommendations  SNF     Equipment Recommendations  None recommended by PT    Recommendations for Other Services       Precautions / Restrictions Precautions Precautions: Fall Restrictions Weight Bearing Restrictions: Yes RLE Weight Bearing: Non weight bearing    Mobility  Bed Mobility Overal bed mobility: Needs Assistance Bed Mobility: Supine to Sit     Supine to sit: Min assist;HOB elevated     General bed mobility comments: Patient requires increased time to perform bed mobility with mod VCS for hand placement and LLE placement; She was able to scoot to edge of bed and push up with arms for sitting edge of bed with min A; Patient reports increased RLE discomfort with movement;   Transfers Overall transfer level: Needs assistance Equipment used: Rolling walker (2 wheeled) Transfers: Sit to/from [facility] Sit to Stand: Mod assist Stand pivot transfers: Max assist       General transfer  comment: Patient transferred sit<>Stand from with RW x3 reps (max A progressing to mod A ) She requierd assistance to maintain RLE NWB and mod VCS for hand placement; Patient able to stand with erect posture for a few seconds with minimal fatigue; She transferred stand pivot to bedside chair with max A +1 with cues for pivoting on LLE and to avoid sitting prior to getting bottom to chair; She was weak and had decreased balance during pivot.   Ambulation/Gait             General Gait Details: unable at this time;    Stairs            Wheelchair Mobility    Modified Rankin (Stroke Patients Only)       Balance Overall balance assessment: Needs assistance Sitting-balance support: Bilateral upper extremity supported Sitting balance-Leahy Scale: Fair     Standing balance support: Bilateral upper extremity supported Standing balance-Leahy Scale: Poor                      Cognition Arousal/Alertness: Awake/alert Behavior During Therapy: WFL for tasks assessed/performed Overall Cognitive Status: Within Functional Limits for tasks assessed                      Exercises Other Exercises Other Exercises: Instructed patient in BLE strengthening exercise: supine in bed: LLE ankle pumps x10; BLE quad sets 3 sec hold x10; RLE SAQ x10, RLE SLR flexion x5; patient required mod VCS and tactile cues to increase ROM For better  strengthening: She was hesitant to initiate RLE movement and reports increased pain with advanced ROM but was able to demonstrate quad activation;     General Comments        Pertinent Vitals/Pain Pain Assessment: 0-10 Pain Score: 5  Pain Location: right ankle; pain with movement; no pain at rest;  Pain Intervention(s): Limited activity within patient's tolerance;Monitored during session;Repositioned    Home Living                      Prior Function            PT Goals (current goals can now be found in the care plan section)  Acute Rehab PT Goals Patient Stated Goal: "To get rid of pain." PT Goal Formulation: With patient Time For Goal Achievement: 11/13/15 Potential to Achieve Goals: Fair Progress towards PT goals: Progressing toward goals    Frequency  BID    PT Plan      Co-evaluation             End of Session Equipment Utilized During Treatment: Gait belt Activity Tolerance: Patient limited by pain;Patient limited by fatigue Patient left: in chair;with call bell/phone within reach;with chair alarm set;with family/visitor present     Time: [id number] PT Time Calculation (min) (ACUTE ONLY): 25 min  Charges:  $Therapeutic Exercise: 8-22 mins $Therapeutic Activity: 8-22 mins                    G Codes:      Wetona Viramontes PT, DPT 10/31/2015, 9:52 AM

## 2015-10-31 NOTE — Care Management Note (Signed)
Case Management Note  Patient Details  Name: Diane Mullins MRN: [id number] Date of Birth: [birth year]  Subjective/Objective:       Currently awaiting SNF placement.              Action/Plan:   Expected Discharge Date:                  Expected Discharge Plan:     In-House Referral:     Discharge planning Services     Post Acute Care Choice:    Choice offered to:     DME Arranged:    DME Agency:     HH Arranged:    [place] Agency:     Status of Service:     Medicare Important Message Given:    Date Medicare IM Given:    Medicare IM give by:    Date Additional Medicare IM Given:    Additional Medicare Important Message give by:     If discussed at [place] of Stay Meetings, dates discussed:    Additional Comments:  Juwann Sherk A, RN 10/31/2015, 5:02 PM

## 2015-10-31 NOTE — Progress Notes (Signed)
Pt reports improved pain and nausea this shift. Plan to d/c to SNF in 1-2 days. Pt has requested [facility] if bed available.

## 2015-10-31 NOTE — Progress Notes (Signed)
[facility] NAME: Diane Mullins    MR#:  [id number]  DATE OF BIRTH:  13-Jul-1933  SUBJECTIVE:   Pt. Here w/ a fall and noted to have a right ankle fracture. Status post open reduction internal fixation of right bimalleolar ankle fracture POD # 2.  N/V improved and doing better today.    REVIEW OF SYSTEMS:    Review of Systems  Constitutional: Negative for fever and chills.  HENT: Negative for congestion and tinnitus.   Eyes: Negative for blurred vision and double vision.  Respiratory: Negative for cough, shortness of breath and wheezing.   Cardiovascular: Negative for chest pain, orthopnea and PND.  Gastrointestinal: Negative for nausea, vomiting, abdominal pain and diarrhea.  Genitourinary: Negative for dysuria and hematuria.  Neurological: Negative for dizziness, sensory change and focal weakness.  All other systems reviewed and are negative.   Nutrition: Soft diet Tolerating Diet: yes Tolerating PT:  Eval noted.    DRUG ALLERGIES:  No Known Allergies  VITALS:  Blood pressure 124/53, pulse 69, temperature 98.1 F (36.7 C), temperature source Oral, resp. rate 16, height 5\' 1"  (1.549 m), weight 93.895 kg (207 lb), SpO2 91 %.  PHYSICAL EXAMINATION:   Physical Exam  GENERAL:  82 y.o.-year-old patient sitting up in chair in NAD.  EYES: Pupils equal, round, reactive to light and accommodation. No scleral icterus. Extraocular muscles intact.  HEENT: Head atraumatic, normocephalic. Oropharynx and nasopharynx clear.  NECK:  Supple, no jugular venous distention. No thyroid enlargement, no tenderness.  LUNGS: Normal breath sounds bilaterally, no wheezing, rales, rhonchi. No use of accessory muscles of respiration.  CARDIOVASCULAR: S1, S2 normal. No murmurs, rubs, or gallops.  ABDOMEN: Soft, nontender, slightly distended. Bowel sounds present. No organomegaly or mass.  EXTREMITIES: No cyanosis, clubbing or edema b/l.   Right  Leg in cast post ankle surgery.  NEUROLOGIC: Cranial nerves II through XII are intact. No focal Motor or sensory deficits b/l.   PSYCHIATRIC: AAO X 3.  Good affect.  SKIN: No obvious rash, lesion, or ulcer.    LABORATORY PANEL:   CBC  Recent Labs Lab 10/31/15 0541  WBC 9.6  HGB 9.6*  HCT 28.6*  PLT 185   ------------------------------------------------------------------------------------------------------------------  Chemistries   Recent Labs Lab 10/31/15 0541  NA 137  K 4.0  CL 101  CO2 29  GLUCOSE 158*  BUN 14  CREATININE 0.89  CALCIUM 8.4*   ------------------------------------------------------------------------------------------------------------------  Cardiac Enzymes No results for input(s): TROPONINI in the last 168 hours. ------------------------------------------------------------------------------------------------------------------  RADIOLOGY:  Dg Knee 1-2 Views Right  10/29/2015  CLINICAL DATA:  Acute right knee pain following injury yesterday. Initial encounter. EXAM: RIGHT KNEE - 1-2 VIEW COMPARISON:  04/01/2014 FINDINGS: A minimally comminuted proximal fibular fracture is identified with 3 mm lateral displacement. Moderate -severe tricompartmental degenerative changes are noted. There is no evidence of subluxation or dislocation. IMPRESSION: Minimally comminuted proximal fibular fracture. Moderate -severe tricompartmental degenerative changes. Electronically Signed   By: Margarette Canada M.D.   On: 10/29/2015 15:52   Dg Ankle Right Port  10/29/2015  CLINICAL DATA:  ORIF right ankle fracture EXAM: PORTABLE RIGHT ANKLE - 2 VIEW COMPARISON:  None. FINDINGS: Oblique distal fibular fracture transfixed with a lateral sideplate and multiple interlocking screws. Two transmalleolar lag screws transfixing the medial malleolar fracture. Ankle mortise is intact. Alignment is near anatomic. Postsurgical changes in the surrounding soft tissues. IMPRESSION: ORIF right ankle  bimalleolar fractures. Electronically Signed   By: Elbert Ewings  Patel   On: 10/29/2015 15:50     ASSESSMENT AND PLAN:   82 year old female with past medical history of hypertension, hyperlipidemia, diabetes, hypothyroidism, osteoarthritis who presents to the hospital after a mechanical fall and noted to have a right ankle fracture.   #1 right ankle fracture-status post open reduction internal fixation of the right bimalleolar fracture POD # 2. -Pain well controlled.  Cont. Lovenox for DVT prophylaxis.  - likely d/c to SNF in 1-2 days.  Appreciate Ortho help.   #2 diabetes type 2 without complication-continue sliding scale insulin. -Hold glimepiride, metformin for now. BS stable.   #3 N/V - due to pain meds.  Improved and tolerating PO now.   #4 hypertension-continue losartan, metoprolol.  #5 hypothyroidism-continue Synthroid.  #6 hyperlipidemia-continue simvastatin.  #7 history of previous TIA- cont. ASA, Statin.   #8 GERD-continue Protonix.  PT eval noted and d/c to SNF in 1-2 days.   All the records are reviewed and case discussed with Care Management/Social Workerr. Management plans discussed with the patient, family and they are in agreement.  CODE STATUS: Full  DVT Prophylaxis: Lovenox  TOTAL TIME TAKING CARE OF THIS PATIENT: 25 minutes.   POSSIBLE D/C IN 1-2 DAYS, DEPENDING ON CLINICAL CONDITION.   Henreitta Leber M.D on 10/31/2015 at 12:47 PM  Between 7am to 6pm - Pager - [phone]  After 6pm go to [email or web address] - password [email or web address] [facility]  Office  [phone]  CC: Primary care physician; Rica Mast, MD

## 2015-11-01 ENCOUNTER — Encounter
Admission: RE | Admit: 2015-11-01 | Discharge: 2015-11-01 | Disposition: A | Discharge status: Home / Self Care | Payer: PPO | Source: Ambulatory Visit | Attending: Internal Medicine | Admitting: Internal Medicine

## 2015-11-01 ENCOUNTER — Encounter: Payer: Self-pay | Admitting: Orthopedic Surgery

## 2015-11-01 DIAGNOSIS — S82841D Displaced bimalleolar fracture of right lower leg, subsequent encounter for closed fracture with routine healing: Secondary | ICD-10-CM | POA: Diagnosis not present

## 2015-11-01 DIAGNOSIS — E119 Type 2 diabetes mellitus without complications: Secondary | ICD-10-CM | POA: Diagnosis not present

## 2015-11-01 DIAGNOSIS — M6281 Muscle weakness (generalized): Secondary | ICD-10-CM | POA: Diagnosis not present

## 2015-11-01 DIAGNOSIS — Z01810 Encounter for preprocedural cardiovascular examination: Secondary | ICD-10-CM | POA: Diagnosis not present

## 2015-11-01 DIAGNOSIS — S82891A Other fracture of right lower leg, initial encounter for closed fracture: Secondary | ICD-10-CM | POA: Diagnosis not present

## 2015-11-01 DIAGNOSIS — E785 Hyperlipidemia, unspecified: Secondary | ICD-10-CM | POA: Diagnosis not present

## 2015-11-01 DIAGNOSIS — I10 Essential (primary) hypertension: Secondary | ICD-10-CM | POA: Diagnosis not present

## 2015-11-01 DIAGNOSIS — Z9181 History of falling: Secondary | ICD-10-CM | POA: Diagnosis not present

## 2015-11-01 DIAGNOSIS — R2689 Other abnormalities of gait and mobility: Secondary | ICD-10-CM | POA: Diagnosis not present

## 2015-11-01 DIAGNOSIS — Z794 Long term (current) use of insulin: Secondary | ICD-10-CM | POA: Diagnosis not present

## 2015-11-01 DIAGNOSIS — R110 Nausea: Secondary | ICD-10-CM | POA: Diagnosis not present

## 2015-11-01 DIAGNOSIS — Z7984 Long term (current) use of oral hypoglycemic drugs: Secondary | ICD-10-CM | POA: Diagnosis not present

## 2015-11-01 LAB — CBC
HEMATOCRIT: 28.8 % — AB (ref 35.0–47.0)
Hemoglobin: 9.5 g/dL — ABNORMAL LOW (ref 12.0–16.0)
MCH: 28.9 pg (ref 26.0–34.0)
MCHC: 33 g/dL (ref 32.0–36.0)
MCV: 87.8 fL (ref 80.0–100.0)
PLATELETS: 198 10*3/uL (ref 150–440)
RBC: 3.28 MIL/uL — ABNORMAL LOW (ref 3.80–5.20)
RDW: 13.9 % (ref 11.5–14.5)
WBC: 8.8 10*3/uL (ref 3.6–11.0)

## 2015-11-01 LAB — GLUCOSE, CAPILLARY
GLUCOSE-CAPILLARY: 282 mg/dL — AB (ref 65–99)
GLUCOSE-CAPILLARY: 289 mg/dL — AB (ref 65–99)

## 2015-11-01 MED ORDER — ALPRAZOLAM 0.25 MG PO TABS: 0.2500 mg | ORAL_TABLET | Freq: Three times a day (TID) | ORAL | Status: DC | PRN

## 2015-11-01 MED ORDER — ENOXAPARIN SODIUM 40 MG/0.4ML ~~LOC~~ SOLN: 40.0000 mg | SUBCUTANEOUS | Status: DC

## 2015-11-01 MED ORDER — FLEET ENEMA 7-19 GM/118ML RE ENEM
1.0000 | ENEMA | Freq: Once | RECTAL | Status: AC
  Administered 2015-11-01: 1 via RECTAL

## 2015-11-01 MED ORDER — LACTULOSE 10 GM/15ML PO SOLN
30.0000 g | Freq: Once | ORAL | Status: AC
  Administered 2015-11-01: 30 g via ORAL
  Filled 2015-11-01: qty 60

## 2015-11-01 MED ORDER — OXYCODONE HCL 10 MG PO TABS: 10.0000 mg | ORAL_TABLET | ORAL | Status: DC | PRN

## 2015-11-01 MED ORDER — BISACODYL 10 MG RE SUPP
10.0000 mg | Freq: Once | RECTAL | Status: AC
  Administered 2015-11-01: 10 mg via RECTAL
  Filled 2015-11-01: qty 1

## 2015-11-01 NOTE — Clinical Social Work Placement (Signed)
   CLINICAL SOCIAL WORK PLACEMENT  NOTE  Date:  11/01/2015  Patient Details  Name: Diane Mullins MRN: [id number] Date of Birth: 17-May-1933  Clinical Social Work is seeking post-discharge placement for this patient at the   level of care (*CSW will initial, date and re-position this form in  chart as items are completed):  Yes   Patient/family provided with [facility] Work Department's list of facilities offering this level of care within the geographic area requested by the patient (or if unable, by the patient's family).  Yes   Patient/family informed of their freedom to choose among providers that offer the needed level of care, that participate in Medicare, Medicaid or managed care program needed by the patient, have an available bed and are willing to accept the patient.  Yes   Patient/family informed of [facility]'s ownership interest in [facility] and [facility], as well as of the fact that they are under no obligation to receive care at these facilities.  PASRR submitted to EDS on 10/30/15     PASRR number received on 10/30/15     Existing PASRR number confirmed on       FL2 transmitted to all facilities in geographic area requested by pt/family on 10/30/15     FL2 transmitted to all facilities within larger geographic area on       Patient informed that his/her managed care company has contracts with or will negotiate with certain facilities, including the following:        Yes   Patient/family informed of bed offers received.  Patient chooses bed at  [facility] )     Physician recommends and patient chooses bed at      Patient to be transferred to  [facility] ) on 11/01/15.  Patient to be transferred to facility by  [facility] )     Patient family notified on 10/25/15 of transfer.  Name of family member notified:   (Patient's daughter Mickey Hebel Mech is aware of D/C today. )     PHYSICIAN       Additional Comment:     _______________________________________________ Loralyn Freshwater, LCSW 11/01/2015, 2:09 PM

## 2015-11-01 NOTE — Care Management (Signed)
I have notified Tim with [facility] of patient discharge to [facility] today. Case closed.

## 2015-11-01 NOTE — Anesthesia Postprocedure Evaluation (Signed)
Anesthesia Post Note  Patient: Diane Mullins  Procedure(s) Performed: Procedure(s) (LRB): OPEN REDUCTION INTERNAL FIXATION (ORIF) ANKLE FRACTURE (Right)  Patient location during evaluation: PACU Anesthesia Type: General Level of consciousness: awake and alert Pain management: pain level controlled Vital Signs Assessment: post-procedure vital signs reviewed and stable Respiratory status: spontaneous breathing, nonlabored ventilation, respiratory function stable and patient connected to nasal cannula oxygen Cardiovascular status: blood pressure returned to baseline and stable Postop Assessment: no signs of nausea or vomiting Anesthetic complications: no    Last Vitals:  Filed Vitals:   11/01/15 0822 11/01/15 1112  BP: 150/61   Pulse: 79 75  Temp: 36.7 C   Resp: 20     Last Pain:  Filed Vitals:   11/01/15 1141  PainSc: 8                  Martha Clan

## 2015-11-01 NOTE — Progress Notes (Signed)
EMS called

## 2015-11-01 NOTE — Progress Notes (Signed)
Clinical Education officer, museum (CSW) presented bed offers to patient. She chose [employer] Per Kim admissions coordinator at [facility] patient is going to room 212 on the overflow unit and will move to a semi-private room when opens up. [facility] authorization has been received. Auth # [id number]. Clinical Education officer, museum (CSW) sent D/C Summary, FL2 and D/C Packet to [employer] via [employer]. Patient is aware of above. CSW contacted patient's daughter Tasheka Houseman Mech and made her aware of above. Robyn inquired about ALF and long term care. CSW explained ALF VS SNF and private pay VS Medicaid. CSW gave Temima Kutsch Mech contacts for [employer] workers. Please reconsult if future social work needs arise. CSW signing off.   Blima Rich, LCSW [phone]

## 2015-11-01 NOTE — Progress Notes (Signed)
Physical Therapy Treatment Patient Details Name: Diane Mullins MRN: [id number] DOB: [birth year] Today's Date: 11/01/2015    History of Present Illness Patient is an 82 y.o. female admitted on 09 Feb. after fall with R ankle fx. ORIF of bimalleolar fx performed on 10 Feb. PMH includes HTN, HLD, DM, and hypothyroidism.    PT Comments    Pt treatment held to bed exercises today, as pt notes she just received a suppository. Pt also complains of 8/10 pain in R  Distal lower extremity and is requesting pain medication. Nursing provides pain medication during session. Pt has complains of pain especially with quad set, short arc quads and straight leg raises, but tolerates exercises. Tolerates other right lower extremity exercises well with assist and left lower extremity exercises with resistance. Pt has discharge orders to skilled nursing facility today to continue rehabilitation.   Follow Up Recommendations  SNF     Equipment Recommendations  None recommended by PT    Recommendations for Other Services       Precautions / Restrictions Restrictions Weight Bearing Restrictions: Yes RLE Weight Bearing: Non weight bearing    Mobility  Bed Mobility               General bed mobility comments: Not tested; pt notes she just received a suppository   Transfers                    Ambulation/Gait                 Stairs            Wheelchair Mobility    Modified Rankin (Stroke Patients Only)       Balance                                    Cognition Arousal/Alertness: Awake/alert Behavior During Therapy: WFL for tasks assessed/performed Overall Cognitive Status: Within Functional Limits for tasks assessed                      Exercises General Exercises - Lower Extremity Ankle Circles/Pumps: Left;AROM;20 reps;Supine (also performs ankle circles) Quad Sets: Strengthening;Both;20 reps;Supine Gluteal Sets: Strengthening;Both;20  reps;Supine Short Arc Quad: AAROM;Right;20 reps;Supine (RROM on L 20x) Heel Slides: AAROM;Right;20 reps;Supine (RROM L 20x) Hip ABduction/ADduction: AAROM;Right;20 reps;Supine (RROM L 20x) Straight Leg Raises: AAROM;Both;20 reps;Supine Other Exercises Other Exercises: RROM partial, gravity assisted ham curl L 20x    General Comments        Pertinent Vitals/Pain Pain Assessment: 0-10 Pain Score: 8  Pain Location: R distal LE; below knee to foot Pain Descriptors / Indicators: Aching;Constant;Throbbing;Tightness Pain Intervention(s): Limited activity within patient's tolerance;Monitored during session;Patient requesting pain meds-RN notified;RN gave pain meds during session    Home Living                      Prior Function            PT Goals (current goals can now be found in the care plan section) Progress towards PT goals: Progressing toward goals    Frequency  BID    PT Plan Current plan remains appropriate    Co-evaluation             End of Session   Activity Tolerance: Patient tolerated treatment well (c/o pain on R with quad set, SAQ and SLR) Patient left: in bed;with  call bell/phone within reach;with bed alarm set;with SCD's reapplied     Time: 1112-1139 PT Time Calculation (min) (ACUTE ONLY): 27 min  Charges:  $Therapeutic Exercise: 23-37 mins                    G Codes:      Charlaine Dalton 11/01/2015, 11:49 AM

## 2015-11-01 NOTE — NC FL2 (Signed)
[place] LEVEL OF CARE SCREENING TOOL     IDENTIFICATION  Patient Name: Diane Mullins Birthdate: October 12, 1932 Sex: female Admission Date (Current Location): 10/28/2015  [place] and [place] Number:  Engineering geologist and Address:  [facility], [address], [place], [place] [address]      Provider Number: [id number]  Attending Physician Name and Address:  Henreitta Leber, MD  Relative Name and Phone Number:       Current Level of Care: Hospital Recommended Level of Care: [place] Prior Approval Number:    Date Approved/Denied:   PASRR Number:    Discharge Plan: SNF    Current Diagnoses: Patient Active Problem List   Diagnosis Date Noted  . Closed right ankle fracture 10/28/2015  . Fatigue 10/18/2015  . TIA (transient ischemic attack) 09/18/2015  . Orthostatic hypotension 08/02/2015  . Right shoulder injury 08/02/2015  . Adjustment disorder with anxious mood 03/30/2015  . Benign paroxysmal positional vertigo 07/17/2014  . Postmenopausal estrogen deficiency 06/03/2014  . DNR (do not resuscitate) discussion 03/04/2014  . Gait disturbance 05/07/2013  . Type 2 diabetes mellitus, uncontrolled ([place]) 04/21/2013  . Tachycardia 01/23/2013  . Medicare annual wellness visit, subsequent 12/09/2012  . Exertional dyspnea 12/09/2012  . Hearing loss 12/09/2012  . Anxiety 01/31/2012  . Hypertension 12/22/2011  . Hyperlipidemia 12/22/2011  . Hypothyroidism 12/22/2011    Orientation RESPIRATION BLADDER Height & Weight     Self, Time, Situation, Place  O2 (2L) Continent Weight: 207 lb (93.895 kg) Height:  5\' 1"  (154.9 cm)  BEHAVIORAL SYMPTOMS/MOOD NEUROLOGICAL BOWEL NUTRITION STATUS      Continent Diet (Soft Diet)  AMBULATORY STATUS COMMUNICATION OF NEEDS Skin   Extensive Assist   Normal                       Personal Care Assistance Level of Assistance  Bathing, Dressing Bathing Assistance: Maximum  assistance Feeding assistance: Independent Dressing Assistance: Maximum assistance     Functional Limitations Info  Sight, Hearing, Speech Sight Info: Adequate Hearing Info: Impaired (Hearing Aids) Speech Info: Adequate    SPECIAL CARE FACTORS FREQUENCY  PT (By licensed PT), OT (By licensed OT)     PT Frequency: 5 OT Frequency: 5            Contractures      Additional Factors Info  Allergies, Code Status, Insulin Sliding Scale Code Status Info: Full Code Allergies Info: No known Allergies   Insulin Sliding Scale Info: 4x/day average       Current Medications (11/01/2015):  This is the current hospital active medication list Current Facility-Administered Medications  Medication Dose Route Frequency Provider Last Rate Last Dose  . 0.9 %  sodium chloride infusion  75 mL/hr Intravenous Continuous [place], MD 75 mL/hr at 10/30/15 0041 75 mL/hr at 10/30/15 0041  . acetaminophen (TYLENOL) tablet 650 mg  650 mg Oral Q6H PRN Thornton Park, MD       Or  . acetaminophen (TYLENOL) suppository 650 mg  650 mg Rectal Q6H PRN Thornton Park, MD      . ALPRAZolam Duanne Moron) tablet 0.25 mg  0.25 mg Oral TID PRN Henreitta Leber, MD      . alum & mag hydroxide-simeth (MAALOX/MYLANTA) 200-200-20 MG/5ML suspension 30 mL  30 mL Oral Q4H PRN Thornton Park, MD      . aspirin tablet 325 mg  325 mg Oral Daily Henreitta Leber, MD   [phone]  mg at 11/01/15 0916  . bisacodyl (DULCOLAX) suppository 10 mg  10 mg Rectal Daily PRN Thornton Park, MD      . bisacodyl (DULCOLAX) suppository 10 mg  10 mg Rectal Once Henreitta Leber, MD      . calcium-vitamin D (OSCAL WITH D) 500-200 MG-UNIT per tablet 0.5 tablet  0.5 tablet Oral Daily Henreitta Leber, MD   0.5 tablet at 11/01/15 0917  . cholecalciferol (VITAMIN D) tablet 1,000 Units  1,000 Units Oral Daily Henreitta Leber, MD   1,000 Units at 11/01/15 [phone]  . docusate sodium (COLACE) capsule 100 mg  100 mg Oral BID Thornton Park, MD   100 mg at  11/01/15 0917  . enoxaparin (LOVENOX) injection 40 mg  40 mg Subcutaneous Q24H Thornton Park, MD   40 mg at 10/31/15 0820  . furosemide (LASIX) tablet 20 mg  20 mg Oral Daily Henreitta Leber, MD   20 mg at 11/01/15 0917  . insulin aspart (novoLOG) injection 0-15 Units  0-15 Units Subcutaneous TID WC Henreitta Leber, MD   3 Units at 11/01/15 [phone]  . insulin aspart (novoLOG) injection 0-5 Units  0-5 Units Subcutaneous QHS Henreitta Leber, MD   2 Units at 10/30/15 2239  . levothyroxine (SYNTHROID, LEVOTHROID) tablet 75 mcg  75 mcg Oral QAC breakfast Henreitta Leber, MD   75 mcg at 10/29/15 1018  . losartan (COZAAR) tablet 50 mg  50 mg Oral Daily Henreitta Leber, MD   50 mg at 11/01/15 0917  . magnesium citrate solution 1 Bottle  1 Bottle Oral Once PRN Thornton Park, MD      . menthol-cetylpyridinium (CEPACOL) lozenge 3 mg  1 lozenge Oral PRN Thornton Park, MD       Or  . phenol (CHLORASEPTIC) mouth spray 1 spray  1 spray Mouth/Throat PRN Thornton Park, MD      . metoprolol succinate (TOPROL-XL) 24 hr tablet 100 mg  100 mg Oral Daily Henreitta Leber, MD   100 mg at 11/01/15 0917  . morphine 2 MG/ML injection 2 mg  2 mg Intravenous Q2H PRN Thornton Park, MD   2 mg at 10/30/15 0457  . multivitamin with minerals tablet 1 tablet  1 tablet Oral Daily Henreitta Leber, MD   1 tablet at 11/01/15 0916  . omega-3 acid ethyl esters (LOVAZA) capsule 1 g  1 g Oral BID Henreitta Leber, MD   1 g at 11/01/15 0916  . ondansetron (ZOFRAN) tablet 4 mg  4 mg Oral Q6H PRN Thornton Park, MD   4 mg at 10/31/15 2113   Or  . ondansetron [facility]) injection 4 mg  4 mg Intravenous Q6H PRN Thornton Park, MD   4 mg at 10/30/15 1757  . oxyCODONE (Oxy IR/ROXICODONE) immediate release tablet 10-15 mg  10-15 mg Oral Q3H PRN Thornton Park, MD   10 mg at 10/31/15 2336  . pantoprazole (PROTONIX) EC tablet 40 mg  40 mg Oral Daily Henreitta Leber, MD   40 mg at 11/01/15 0917  . polyethylene glycol (MIRALAX / GLYCOLAX)  packet 17 g  17 g Oral Daily PRN Thornton Park, MD      . [facility]) tablet 8.6 mg  1 tablet Oral BID Thornton Park, MD   8.6 mg at 11/01/15 0917  . simvastatin (ZOCOR) tablet 20 mg  20 mg Oral QPM Henreitta Leber, MD   20 mg at 10/31/15 1831     Discharge Medications: Please  see discharge summary for a list of discharge medications.  Relevant Imaging Results:  Relevant Lab Results:   Additional Information SSN:  [id number]  Loralyn Freshwater, LCSW

## 2015-11-01 NOTE — Discharge Summary (Signed)
[facility] NAME: Diane Mullins    MR#:  [id number]  DATE OF BIRTH:  10-18-1932  DATE OF ADMISSION:  10/28/2015 ADMITTING PHYSICIAN: Henreitta Leber, MD  DATE OF DISCHARGE: 11/01/2015  PRIMARY CARE PHYSICIAN: Rica Mast, MD    ADMISSION DIAGNOSIS:  Ankle fracture, bimalleolar, closed, right, initial encounter [S82.841A]  DISCHARGE DIAGNOSIS:  Active Problems:   Closed right ankle fracture   SECONDARY DIAGNOSIS:   Past Medical History  Diagnosis Date  . Hyperlipidemia   . Hypertension   . Diabetes mellitus   . Thyroid disease   . H/O: rheumatic fever   . Stroke [facility])     TIA Jan. 1st    HOSPITAL COURSE:   82 year old female with past medical history of hypertension, hyperlipidemia, diabetes, hypothyroidism, osteoarthritis who presents to the hospital after a mechanical fall and noted to have a right ankle fracture.   #1 right ankle fracture-this was due to a mechanical fall. Patient is now status post open reduction internal fixation of the right bimalleolar fracture POD # 3. -Patient's pain is well controlled on oral medications. She was seen by physical therapy and is tolerating it well. -She is being discharged to a skilled nursing facility for ongoing care and will continue Lovenox for DVT prophylaxis for 2 more weeks. She will follow-up with orthopedics in the next 10 days to 2 weeks.  #2 diabetes type 2 without complication-while in the hospital patient was maintained on sliding scale insulin but is now being discharged back on our home dose Glimeperide and metformin.   #3 Nausea/vomiting - this was due to pain meds. Improved and resolved w/ anti-emetics.  - pt. Is tolerating PO well now.   #4 hypertension- she will continue losartan, metoprolol.  #5 hypothyroidism-she will continue Synthroid.  #6 hyperlipidemia-she will continue simvastatin.  #7 history of previous TIA- she will cont. ASA,  Statin.   #8 GERD-she will continue her home dose omeprazole.   DISCHARGE CONDITIONS:   Stable  CONSULTS OBTAINED:  Treatment Team:  Thornton Park, MD  DRUG ALLERGIES:  No Known Allergies  DISCHARGE MEDICATIONS:   Current Discharge Medication List    START taking these medications   Details  enoxaparin (LOVENOX) 40 MG/0.4ML injection Inject 0.4 mLs (40 mg total) into the skin daily. Qty: 0 Syringe    oxyCODONE 10 MG TABS Take 1-1.5 tablets (10-15 mg total) by mouth every 3 (three) hours as needed for breakthrough pain ((for MODERATE breakthrough pain)). Qty: 30 tablet, Refills: 0      CONTINUE these medications which have CHANGED   Details  ALPRAZolam (XANAX) 0.25 MG tablet Take 1 tablet (0.25 mg total) by mouth 3 (three) times daily as needed for sleep or anxiety. Qty: 60 tablet, Refills: 0   Associated Diagnoses: Adjustment disorder with anxious mood      CONTINUE these medications which have NOT CHANGED   Details  aspirin 325 MG tablet Take 1 tablet (325 mg total) by mouth daily. Qty: 30 tablet, Refills: 0    calcium-vitamin D (OSCAL) 250-125 MG-UNIT per tablet Take 1 tablet by mouth daily.    cholecalciferol (VITAMIN D) 1000 units tablet Take 1,000 Units by mouth daily.    fish oil-omega-3 fatty acids 1000 MG capsule Take 1 g by mouth 2 (two) times daily.     furosemide (LASIX) 20 MG tablet TAKE 1 TABLET EVERY DAY Qty: 90 tablet, Refills: 1    glimepiride (AMARYL) 4 MG tablet TAKE  1 TABLET EVERY MORNING  AND TAKE 1/2 TABLET EVERY EVENING Qty: 135 tablet, Refills: 1    LANTUS SOLOSTAR 100 UNIT/ML Solostar Pen INJECT  15 UNITS SUBCUTANEOUSLY IN THE MORNING Qty: 15 mL, Refills: 6    levothyroxine (SYNTHROID, LEVOTHROID) 75 MCG tablet Take 1 tablet (75 mcg total) by mouth daily. Qty: 90 tablet, Refills: 3    losartan (COZAAR) 50 MG tablet Take 1 tablet (50 mg total) by mouth daily. Qty: 90 tablet, Refills: 3    metFORMIN (GLUCOPHAGE) 1000 MG tablet TAKE  1 TABLET TWICE DAILY  WITH  A  MEAL Qty: 180 tablet, Refills: 3    metoprolol succinate (TOPROL-XL) 100 MG 24 hr tablet Take 1 tablet (100 mg total) by mouth daily. Qty: 90 tablet, Refills: 3   Associated Diagnoses: Essential hypertension    Multiple Vitamins-Minerals (MULTIVITAMIN WITH MINERALS) tablet Take 1 tablet by mouth daily.    omeprazole (PRILOSEC) 20 MG capsule Take 1 capsule (20 mg total) by mouth daily. Qty: 90 capsule, Refills: 3    simvastatin (ZOCOR) 20 MG tablet Take 1 tablet (20 mg total) by mouth every evening. Qty: 90 tablet, Refills: 3         DISCHARGE INSTRUCTIONS:   DIET:  Cardiac diet and Diabetic diet  DISCHARGE CONDITION:  Stable  ACTIVITY:  Activity as tolerated  OXYGEN:  Home Oxygen: No.   Oxygen Delivery: room air  DISCHARGE LOCATION:  nursing home   If you experience worsening of your admission symptoms, develop shortness of breath, life threatening emergency, suicidal or homicidal thoughts you must seek medical attention immediately by calling 911 or calling your MD immediately  if symptoms less severe.  You Must read complete instructions/literature along with all the possible adverse reactions/side effects for all the Medicines you take and that have been prescribed to you. Take any new Medicines after you have completely understood and accpet all the possible adverse reactions/side effects.   Please note  You were cared for by a hospitalist during your hospital stay. If you have any questions about your discharge medications or the care you received while you were in the hospital after you are discharged, you can call the unit and asked to speak with the hospitalist on call if the hospitalist that took care of you is not available. Once you are discharged, your primary care physician will handle any further medical issues. Please note that NO REFILLS for any discharge medications will be authorized once you are discharged, as it is  imperative that you return to your primary care physician (or establish a relationship with a primary care physician if you do not have one) for your aftercare needs so that they can reassess your need for medications and monitor your lab values.     Today   No nausea/vomiting today. Constipated and no bowel movement since hospitalization.  VITAL SIGNS:  Blood pressure 150/61, pulse 79, temperature 98 F (36.7 C), temperature source Oral, resp. rate 20, height 5\' 1"  (1.549 m), weight 93.895 kg (207 lb), SpO2 90 %.  I/O:    Intake/Output Summary (Last 24 hours) at 11/01/15 0953 Last data filed at 11/01/15 0732  Gross per 24 hour  Intake    120 ml  Output    800 ml  Net   -680 ml    PHYSICAL EXAMINATION:   GENERAL: 82 y.o.-year-old patient sitting up in chair in NAD.  EYES: Pupils equal, round, reactive to light and accommodation. No scleral icterus. Extraocular muscles intact.  HEENT: Head atraumatic, normocephalic. Oropharynx and nasopharynx clear.  NECK: Supple, no jugular venous distention. No thyroid enlargement, no tenderness.  LUNGS: Normal breath sounds bilaterally, no wheezing, rales, rhonchi. No use of accessory muscles of respiration.  CARDIOVASCULAR: S1, S2 normal. No murmurs, rubs, or gallops.  ABDOMEN: Soft, nontender, slightly distended. Bowel sounds present. No organomegaly or mass.  EXTREMITIES: No cyanosis, clubbing or edema b/l. Right Leg in cast post ankle surgery.  NEUROLOGIC: Cranial nerves II through XII are intact. No focal Motor or sensory deficits b/l.  PSYCHIATRIC: AAO X 3. Good affect.  SKIN: No obvious rash, lesion, or ulcer.    DATA REVIEW:   CBC  Recent Labs Lab 11/01/15 0524  WBC 8.8  HGB 9.5*  HCT 28.8*  PLT 198    Chemistries   Recent Labs Lab 10/31/15 0541  NA 137  K 4.0  CL 101  CO2 29  GLUCOSE 158*  BUN 14  CREATININE 0.89  CALCIUM 8.4*    Cardiac Enzymes No results for input(s): TROPONINI in the  last 168 hours.  Microbiology Results  Results for orders placed or performed during the hospital encounter of 10/28/15  Surgical pcr screen     Status: None   Collection Time: 10/28/15  3:01 PM  Result Value Ref Range Status   MRSA, PCR NEGATIVE NEGATIVE Final   Staphylococcus aureus NEGATIVE NEGATIVE Final    Comment:        The Xpert SA Assay (FDA approved for NASAL specimens in patients over 4 years of age), is one component of a comprehensive surveillance program.  Test performance has been validated by [facility] for patients greater than or equal to 56 year old. It is not intended to diagnose infection nor to guide or monitor treatment.     RADIOLOGY:  No results found.    Management plans discussed with the patient, family and they are in agreement.  CODE STATUS:  Code Status History    Date Active Date Inactive Code Status Order ID Comments User Context   10/28/2015  3:01 PM 10/29/2015  5:18 PM Full Code [id number]  Henreitta Leber, MD Inpatient   09/18/2015  4:39 PM 09/20/2015 10:43 PM Full Code [id number]  Gladstone Lighter, MD Inpatient    Advance Directive Documentation        Most Recent Value   Type of Advance Directive  Healthcare Power of Attorney   Pre-existing out of facility DNR order (yellow form or pink MOST form)     "MOST" Form in Place?        TOTAL TIME TAKING CARE OF THIS PATIENT: 40 minutes.    Henreitta Leber M.D on 11/01/2015 at 9:53 AM  Between 7am to 6pm - Pager - [phone]  After 6pm go to [email or web address] - password [email or web address] [place] [facility]  Office  [phone]  CC: Primary care physician; Rica Mast, MD

## 2015-11-01 NOTE — Progress Notes (Signed)
Report called to [facility].

## 2015-11-01 NOTE — Progress Notes (Signed)
  Subjective:  Patient is postop day #3 status post ORIF of right ankle. Patient reports pain as mild.  Her pain is improving. She denies any chest pain, shortness of breath or abdominal pain. She has had no nausea or vomiting. Her sister and brother-in-law were at the bedside.  Objective:   VITALS:   Filed Vitals:   11/01/15 0407 11/01/15 0408 11/01/15 0822 11/01/15 1112  BP: 132/51  150/61   Pulse: 63 65 79 75  Temp: 98.3 F (36.8 C)  98 F (36.7 C)   TempSrc: Oral  Oral   Resp: 16  20   Height:      Weight:      SpO2: 89% 93% 90% 93%    PHYSICAL EXAM:  Right lower extremity: Patient's dressing is clean dry and intact.  She can flex and extend her toes. Her toes are well-perfused. He has intact sensation light touch in all 5 toes. She has no pain with passive stretch.   LABS  Results for orders placed or performed during the hospital encounter of 10/28/15 (from the past 24 hour(s))  Glucose, capillary     Status: Abnormal   Collection Time: 10/31/15  4:02 PM  Result Value Ref Range   Glucose-Capillary 268 (H) 65 - 99 mg/dL   Comment 1 Notify RN   Glucose, capillary     Status: Abnormal   Collection Time: 10/31/15  9:29 PM  Result Value Ref Range   Glucose-Capillary 213 (H) 65 - 99 mg/dL   Comment 1 Notify RN   Glucose, capillary     Status: Abnormal   Collection Time: 10/31/15 10:59 PM  Result Value Ref Range   Glucose-Capillary 197 (H) 65 - 99 mg/dL   Comment 1 Notify RN   CBC     Status: Abnormal   Collection Time: 11/01/15  5:24 AM  Result Value Ref Range   WBC 8.8 3.6 - 11.0 K/uL   RBC 3.28 (L) 3.80 - 5.20 MIL/uL   Hemoglobin 9.5 (L) 12.0 - 16.0 g/dL   HCT 28.8 (L) 35.0 - 47.0 %   MCV 87.8 80.0 - 100.0 fL   MCH 28.9 26.0 - 34.0 pg   MCHC 33.0 32.0 - 36.0 g/dL   RDW 13.9 11.5 - 14.5 %   Platelets 198 150 - 440 K/uL  Glucose, capillary     Status: Abnormal   Collection Time: 11/01/15 11:23 AM  Result Value Ref Range   Glucose-Capillary 282 (H) 65 - 99  mg/dL    No results found.  Assessment/Plan: 3 Days Post-Op   Active Problems:   Closed right ankle fracture  Patient is ready for discharge from an orthopedic standpoint. She should continue to elevate her right lower extremity and the apply ice to her ankle. She is nonweightbearing on the right lower extremity with total of 6 weeks postop. She will return to the office in 7-10 days for wound check and staple removal. The cast will be applied at that visit. She is going to a skilled nursing facility. Patient will remain on Lovenox 40 mg daily for a minimum of 2 weeks.   Thornton Park , MD 11/01/2015, 1:14 PM

## 2015-11-02 DIAGNOSIS — E119 Type 2 diabetes mellitus without complications: Secondary | ICD-10-CM | POA: Diagnosis not present

## 2015-11-02 DIAGNOSIS — M810 Age-related osteoporosis without current pathological fracture: Secondary | ICD-10-CM | POA: Diagnosis not present

## 2015-11-02 DIAGNOSIS — Z8673 Personal history of transient ischemic attack (TIA), and cerebral infarction without residual deficits: Secondary | ICD-10-CM | POA: Diagnosis not present

## 2015-11-02 DIAGNOSIS — I10 Essential (primary) hypertension: Secondary | ICD-10-CM | POA: Diagnosis not present

## 2015-11-02 LAB — GLUCOSE, CAPILLARY
GLUCOSE-CAPILLARY: 306 mg/dL — AB (ref 65–99)
GLUCOSE-CAPILLARY: 220 mg/dL — AB (ref 65–99)
Glucose-Capillary: 312 mg/dL — ABNORMAL HIGH (ref 65–99)
Glucose-Capillary: 207 mg/dL — ABNORMAL HIGH (ref 65–99)

## 2015-11-03 DIAGNOSIS — E119 Type 2 diabetes mellitus without complications: Secondary | ICD-10-CM | POA: Diagnosis not present

## 2015-11-04 LAB — GLUCOSE, CAPILLARY
GLUCOSE-CAPILLARY: 139 mg/dL — AB (ref 65–99)
GLUCOSE-CAPILLARY: 251 mg/dL — AB (ref 65–99)
GLUCOSE-CAPILLARY: 183 mg/dL — AB (ref 65–99)
Glucose-Capillary: 215 mg/dL — ABNORMAL HIGH (ref 65–99)

## 2015-11-05 DIAGNOSIS — E119 Type 2 diabetes mellitus without complications: Secondary | ICD-10-CM | POA: Diagnosis not present

## 2015-11-05 LAB — GLUCOSE, CAPILLARY
GLUCOSE-CAPILLARY: 94 mg/dL (ref 65–99)
GLUCOSE-CAPILLARY: 176 mg/dL — AB (ref 65–99)
GLUCOSE-CAPILLARY: 76 mg/dL (ref 65–99)
GLUCOSE-CAPILLARY: 176 mg/dL — AB (ref 65–99)
Glucose-Capillary: 240 mg/dL — ABNORMAL HIGH (ref 65–99)
Glucose-Capillary: 161 mg/dL — ABNORMAL HIGH (ref 65–99)
Glucose-Capillary: 114 mg/dL — ABNORMAL HIGH (ref 65–99)
Glucose-Capillary: 230 mg/dL — ABNORMAL HIGH (ref 65–99)

## 2015-11-06 DIAGNOSIS — E119 Type 2 diabetes mellitus without complications: Secondary | ICD-10-CM | POA: Diagnosis not present

## 2015-11-06 LAB — GLUCOSE, CAPILLARY
GLUCOSE-CAPILLARY: 182 mg/dL — AB (ref 65–99)
GLUCOSE-CAPILLARY: 181 mg/dL — AB (ref 65–99)
Glucose-Capillary: 106 mg/dL — ABNORMAL HIGH (ref 65–99)
Glucose-Capillary: 216 mg/dL — ABNORMAL HIGH (ref 65–99)

## 2015-11-07 DIAGNOSIS — E119 Type 2 diabetes mellitus without complications: Secondary | ICD-10-CM | POA: Diagnosis not present

## 2015-11-07 LAB — GLUCOSE, CAPILLARY
GLUCOSE-CAPILLARY: 124 mg/dL — AB (ref 65–99)
GLUCOSE-CAPILLARY: 198 mg/dL — AB (ref 65–99)
GLUCOSE-CAPILLARY: 107 mg/dL — AB (ref 65–99)
Glucose-Capillary: 145 mg/dL — ABNORMAL HIGH (ref 65–99)

## 2015-11-08 DIAGNOSIS — E119 Type 2 diabetes mellitus without complications: Secondary | ICD-10-CM | POA: Diagnosis not present

## 2015-11-08 LAB — GLUCOSE, CAPILLARY: GLUCOSE-CAPILLARY: 228 mg/dL — AB (ref 65–99)

## 2015-11-09 ENCOUNTER — Telehealth: Payer: Self-pay | Admitting: Internal Medicine

## 2015-11-09 DIAGNOSIS — S82841A Displaced bimalleolar fracture of right lower leg, initial encounter for closed fracture: Secondary | ICD-10-CM | POA: Diagnosis not present

## 2015-11-09 LAB — GLUCOSE, CAPILLARY
GLUCOSE-CAPILLARY: 183 mg/dL — AB (ref 65–99)
GLUCOSE-CAPILLARY: 221 mg/dL — AB (ref 65–99)
GLUCOSE-CAPILLARY: 147 mg/dL — AB (ref 65–99)
Glucose-Capillary: 154 mg/dL — ABNORMAL HIGH (ref 65–99)
Glucose-Capillary: 225 mg/dL — ABNORMAL HIGH (ref 65–99)
Glucose-Capillary: 131 mg/dL — ABNORMAL HIGH (ref 65–99)

## 2015-11-09 NOTE — Telephone Encounter (Signed)
Pt's daughter dropped off FMLA paper work to be filled out by Dr. Gilford Rile. Papers are in Dr. Thomes Dinning box.

## 2015-11-10 DIAGNOSIS — E119 Type 2 diabetes mellitus without complications: Secondary | ICD-10-CM | POA: Diagnosis not present

## 2015-11-10 LAB — GLUCOSE, CAPILLARY
GLUCOSE-CAPILLARY: 92 mg/dL (ref 65–99)
GLUCOSE-CAPILLARY: 224 mg/dL — AB (ref 65–99)
Glucose-Capillary: 85 mg/dL (ref 65–99)
Glucose-Capillary: 101 mg/dL — ABNORMAL HIGH (ref 65–99)

## 2015-11-10 NOTE — Telephone Encounter (Signed)
Per Dr Gilford Rile, forms need to be completed by daughters orthopaedic.  Left vm for pt to call back

## 2015-11-10 NOTE — Telephone Encounter (Signed)
Daughter called back and is coming to pick up papers.

## 2015-11-10 NOTE — Telephone Encounter (Signed)
Papers put in folder ready for pick up

## 2015-11-11 DIAGNOSIS — E119 Type 2 diabetes mellitus without complications: Secondary | ICD-10-CM | POA: Diagnosis not present

## 2015-11-11 LAB — GLUCOSE, CAPILLARY
GLUCOSE-CAPILLARY: 195 mg/dL — AB (ref 65–99)
GLUCOSE-CAPILLARY: 141 mg/dL — AB (ref 65–99)
GLUCOSE-CAPILLARY: 252 mg/dL — AB (ref 65–99)
Glucose-Capillary: 91 mg/dL (ref 65–99)

## 2015-11-12 DIAGNOSIS — E119 Type 2 diabetes mellitus without complications: Secondary | ICD-10-CM | POA: Diagnosis not present

## 2015-11-13 LAB — GLUCOSE, CAPILLARY
GLUCOSE-CAPILLARY: 102 mg/dL — AB (ref 65–99)
GLUCOSE-CAPILLARY: 154 mg/dL — AB (ref 65–99)
GLUCOSE-CAPILLARY: 106 mg/dL — AB (ref 65–99)
Glucose-Capillary: 95 mg/dL (ref 65–99)

## 2015-11-15 DIAGNOSIS — E119 Type 2 diabetes mellitus without complications: Secondary | ICD-10-CM | POA: Diagnosis not present

## 2015-11-15 LAB — GLUCOSE, CAPILLARY
GLUCOSE-CAPILLARY: 112 mg/dL — AB (ref 65–99)
GLUCOSE-CAPILLARY: 191 mg/dL — AB (ref 65–99)
GLUCOSE-CAPILLARY: 179 mg/dL — AB (ref 65–99)
GLUCOSE-CAPILLARY: 145 mg/dL — AB (ref 65–99)
GLUCOSE-CAPILLARY: 63 mg/dL — AB (ref 65–99)
Glucose-Capillary: 166 mg/dL — ABNORMAL HIGH (ref 65–99)
Glucose-Capillary: 99 mg/dL (ref 65–99)
Glucose-Capillary: 162 mg/dL — ABNORMAL HIGH (ref 65–99)
Glucose-Capillary: 67 mg/dL (ref 65–99)
Glucose-Capillary: 141 mg/dL — ABNORMAL HIGH (ref 65–99)
Glucose-Capillary: 171 mg/dL — ABNORMAL HIGH (ref 65–99)
Glucose-Capillary: 168 mg/dL — ABNORMAL HIGH (ref 65–99)

## 2015-11-16 DIAGNOSIS — E119 Type 2 diabetes mellitus without complications: Secondary | ICD-10-CM | POA: Diagnosis not present

## 2015-11-16 LAB — GLUCOSE, CAPILLARY
GLUCOSE-CAPILLARY: 97 mg/dL (ref 65–99)
GLUCOSE-CAPILLARY: 170 mg/dL — AB (ref 65–99)
GLUCOSE-CAPILLARY: 154 mg/dL — AB (ref 65–99)
Glucose-Capillary: 109 mg/dL — ABNORMAL HIGH (ref 65–99)

## 2015-11-17 ENCOUNTER — Encounter
Admission: RE | Admit: 2015-11-17 | Discharge: 2015-11-17 | Disposition: A | Discharge status: Home / Self Care | Payer: PPO | Source: Ambulatory Visit | Attending: Internal Medicine | Admitting: Internal Medicine

## 2015-11-17 DIAGNOSIS — Z7984 Long term (current) use of oral hypoglycemic drugs: Secondary | ICD-10-CM | POA: Diagnosis not present

## 2015-11-17 DIAGNOSIS — E785 Hyperlipidemia, unspecified: Secondary | ICD-10-CM | POA: Diagnosis not present

## 2015-11-17 DIAGNOSIS — I10 Essential (primary) hypertension: Secondary | ICD-10-CM | POA: Diagnosis not present

## 2015-11-17 DIAGNOSIS — E119 Type 2 diabetes mellitus without complications: Secondary | ICD-10-CM | POA: Insufficient documentation

## 2015-11-17 DIAGNOSIS — Z9181 History of falling: Secondary | ICD-10-CM | POA: Diagnosis not present

## 2015-11-17 DIAGNOSIS — R2689 Other abnormalities of gait and mobility: Secondary | ICD-10-CM | POA: Diagnosis not present

## 2015-11-17 DIAGNOSIS — M6281 Muscle weakness (generalized): Secondary | ICD-10-CM | POA: Diagnosis not present

## 2015-11-17 DIAGNOSIS — S82841D Displaced bimalleolar fracture of right lower leg, subsequent encounter for closed fracture with routine healing: Secondary | ICD-10-CM | POA: Diagnosis not present

## 2015-11-17 DIAGNOSIS — Z794 Long term (current) use of insulin: Secondary | ICD-10-CM | POA: Diagnosis not present

## 2015-11-18 DIAGNOSIS — E119 Type 2 diabetes mellitus without complications: Secondary | ICD-10-CM | POA: Diagnosis not present

## 2015-11-18 LAB — GLUCOSE, CAPILLARY
GLUCOSE-CAPILLARY: 84 mg/dL (ref 65–99)
GLUCOSE-CAPILLARY: 109 mg/dL — AB (ref 65–99)
GLUCOSE-CAPILLARY: 59 mg/dL — AB (ref 65–99)
GLUCOSE-CAPILLARY: 82 mg/dL (ref 65–99)
Glucose-Capillary: 118 mg/dL — ABNORMAL HIGH (ref 65–99)
Glucose-Capillary: 104 mg/dL — ABNORMAL HIGH (ref 65–99)
Glucose-Capillary: 99 mg/dL (ref 65–99)
Glucose-Capillary: 216 mg/dL — ABNORMAL HIGH (ref 65–99)
Glucose-Capillary: 104 mg/dL — ABNORMAL HIGH (ref 65–99)

## 2015-11-19 DIAGNOSIS — E119 Type 2 diabetes mellitus without complications: Secondary | ICD-10-CM | POA: Diagnosis not present

## 2015-11-19 LAB — GLUCOSE, CAPILLARY
GLUCOSE-CAPILLARY: 103 mg/dL — AB (ref 65–99)
GLUCOSE-CAPILLARY: 183 mg/dL — AB (ref 65–99)
Glucose-Capillary: 130 mg/dL — ABNORMAL HIGH (ref 65–99)

## 2015-11-20 DIAGNOSIS — E119 Type 2 diabetes mellitus without complications: Secondary | ICD-10-CM | POA: Diagnosis not present

## 2015-11-20 LAB — GLUCOSE, CAPILLARY
Glucose-Capillary: 61 mg/dL — ABNORMAL LOW (ref 65–99)
Glucose-Capillary: 127 mg/dL — ABNORMAL HIGH (ref 65–99)
Glucose-Capillary: 214 mg/dL — ABNORMAL HIGH (ref 65–99)
Glucose-Capillary: 164 mg/dL — ABNORMAL HIGH (ref 65–99)

## 2015-11-21 DIAGNOSIS — E119 Type 2 diabetes mellitus without complications: Secondary | ICD-10-CM | POA: Diagnosis not present

## 2015-11-21 LAB — GLUCOSE, CAPILLARY
GLUCOSE-CAPILLARY: 74 mg/dL (ref 65–99)
Glucose-Capillary: 198 mg/dL — ABNORMAL HIGH (ref 65–99)
Glucose-Capillary: 157 mg/dL — ABNORMAL HIGH (ref 65–99)
Glucose-Capillary: 223 mg/dL — ABNORMAL HIGH (ref 65–99)

## 2015-11-22 DIAGNOSIS — E119 Type 2 diabetes mellitus without complications: Secondary | ICD-10-CM | POA: Diagnosis not present

## 2015-11-22 LAB — GLUCOSE, CAPILLARY
GLUCOSE-CAPILLARY: 75 mg/dL (ref 65–99)
GLUCOSE-CAPILLARY: 188 mg/dL — AB (ref 65–99)

## 2015-11-23 ENCOUNTER — Encounter: Payer: PPO | Admitting: Internal Medicine

## 2015-11-23 ENCOUNTER — Telehealth: Payer: Self-pay | Admitting: Internal Medicine

## 2015-11-23 LAB — GLUCOSE, CAPILLARY
GLUCOSE-CAPILLARY: 64 mg/dL — AB (ref 65–99)
GLUCOSE-CAPILLARY: 187 mg/dL — AB (ref 65–99)
Glucose-Capillary: 160 mg/dL — ABNORMAL HIGH (ref 65–99)
Glucose-Capillary: 134 mg/dL — ABNORMAL HIGH (ref 65–99)

## 2015-11-23 NOTE — Telephone Encounter (Signed)
[name] [phone] called from [facility] regarding pt was discharged from skilled nursing doctor there wrote a order for PT and OT. But they need a verbal PT and OT. Thank you!

## 2015-11-24 NOTE — Telephone Encounter (Signed)
Unable to leave message, states memory full

## 2015-11-25 ENCOUNTER — Encounter: Payer: Self-pay | Admitting: Internal Medicine

## 2015-11-25 NOTE — Telephone Encounter (Signed)
Called pt unable to leave a VM

## 2015-11-30 DIAGNOSIS — Z7982 Long term (current) use of aspirin: Secondary | ICD-10-CM | POA: Diagnosis not present

## 2015-11-30 DIAGNOSIS — H9190 Unspecified hearing loss, unspecified ear: Secondary | ICD-10-CM | POA: Diagnosis not present

## 2015-11-30 DIAGNOSIS — Z9181 History of falling: Secondary | ICD-10-CM | POA: Diagnosis not present

## 2015-11-30 DIAGNOSIS — S82841D Displaced bimalleolar fracture of right lower leg, subsequent encounter for closed fracture with routine healing: Secondary | ICD-10-CM | POA: Diagnosis not present

## 2015-11-30 DIAGNOSIS — K219 Gastro-esophageal reflux disease without esophagitis: Secondary | ICD-10-CM | POA: Diagnosis not present

## 2015-11-30 DIAGNOSIS — F4322 Adjustment disorder with anxiety: Secondary | ICD-10-CM | POA: Diagnosis not present

## 2015-11-30 DIAGNOSIS — E785 Hyperlipidemia, unspecified: Secondary | ICD-10-CM | POA: Diagnosis not present

## 2015-11-30 DIAGNOSIS — Z8673 Personal history of transient ischemic attack (TIA), and cerebral infarction without residual deficits: Secondary | ICD-10-CM | POA: Diagnosis not present

## 2015-11-30 DIAGNOSIS — E039 Hypothyroidism, unspecified: Secondary | ICD-10-CM | POA: Diagnosis not present

## 2015-11-30 DIAGNOSIS — M1991 Primary osteoarthritis, unspecified site: Secondary | ICD-10-CM | POA: Diagnosis not present

## 2015-11-30 DIAGNOSIS — I10 Essential (primary) hypertension: Secondary | ICD-10-CM | POA: Diagnosis not present

## 2015-11-30 DIAGNOSIS — Z794 Long term (current) use of insulin: Secondary | ICD-10-CM | POA: Diagnosis not present

## 2015-11-30 DIAGNOSIS — E119 Type 2 diabetes mellitus without complications: Secondary | ICD-10-CM | POA: Diagnosis not present

## 2015-11-30 DIAGNOSIS — Z7984 Long term (current) use of oral hypoglycemic drugs: Secondary | ICD-10-CM | POA: Diagnosis not present

## 2015-12-01 ENCOUNTER — Telehealth: Payer: Self-pay

## 2015-12-01 NOTE — Telephone Encounter (Signed)
Fine to give verbal order.  

## 2015-12-01 NOTE — Telephone Encounter (Signed)
Verbal order given  

## 2015-12-01 NOTE — Telephone Encounter (Signed)
Diane Mullins  From PT needs a verbal order for PT 3 times a week for 4 weeks and 2 times a week for 3 weeks. Please advise if okay to give Verbal. Thanks

## 2015-12-06 DIAGNOSIS — M1991 Primary osteoarthritis, unspecified site: Secondary | ICD-10-CM | POA: Diagnosis not present

## 2015-12-06 DIAGNOSIS — S82841D Displaced bimalleolar fracture of right lower leg, subsequent encounter for closed fracture with routine healing: Secondary | ICD-10-CM | POA: Diagnosis not present

## 2015-12-06 DIAGNOSIS — Z8673 Personal history of transient ischemic attack (TIA), and cerebral infarction without residual deficits: Secondary | ICD-10-CM | POA: Diagnosis not present

## 2015-12-06 DIAGNOSIS — E119 Type 2 diabetes mellitus without complications: Secondary | ICD-10-CM | POA: Diagnosis not present

## 2015-12-06 DIAGNOSIS — Z7982 Long term (current) use of aspirin: Secondary | ICD-10-CM | POA: Diagnosis not present

## 2015-12-06 DIAGNOSIS — E039 Hypothyroidism, unspecified: Secondary | ICD-10-CM | POA: Diagnosis not present

## 2015-12-06 DIAGNOSIS — I10 Essential (primary) hypertension: Secondary | ICD-10-CM | POA: Diagnosis not present

## 2015-12-06 DIAGNOSIS — K219 Gastro-esophageal reflux disease without esophagitis: Secondary | ICD-10-CM | POA: Diagnosis not present

## 2015-12-06 DIAGNOSIS — Z7984 Long term (current) use of oral hypoglycemic drugs: Secondary | ICD-10-CM | POA: Diagnosis not present

## 2015-12-06 DIAGNOSIS — E785 Hyperlipidemia, unspecified: Secondary | ICD-10-CM | POA: Diagnosis not present

## 2015-12-06 DIAGNOSIS — H9190 Unspecified hearing loss, unspecified ear: Secondary | ICD-10-CM | POA: Diagnosis not present

## 2015-12-06 DIAGNOSIS — F4322 Adjustment disorder with anxiety: Secondary | ICD-10-CM | POA: Diagnosis not present

## 2015-12-06 DIAGNOSIS — Z794 Long term (current) use of insulin: Secondary | ICD-10-CM | POA: Diagnosis not present

## 2015-12-07 DIAGNOSIS — S82841D Displaced bimalleolar fracture of right lower leg, subsequent encounter for closed fracture with routine healing: Secondary | ICD-10-CM | POA: Diagnosis not present

## 2015-12-13 DIAGNOSIS — Z8673 Personal history of transient ischemic attack (TIA), and cerebral infarction without residual deficits: Secondary | ICD-10-CM | POA: Diagnosis not present

## 2015-12-13 DIAGNOSIS — E119 Type 2 diabetes mellitus without complications: Secondary | ICD-10-CM | POA: Diagnosis not present

## 2015-12-13 DIAGNOSIS — F4322 Adjustment disorder with anxiety: Secondary | ICD-10-CM | POA: Diagnosis not present

## 2015-12-13 DIAGNOSIS — Z7984 Long term (current) use of oral hypoglycemic drugs: Secondary | ICD-10-CM | POA: Diagnosis not present

## 2015-12-13 DIAGNOSIS — E039 Hypothyroidism, unspecified: Secondary | ICD-10-CM | POA: Diagnosis not present

## 2015-12-13 DIAGNOSIS — Z794 Long term (current) use of insulin: Secondary | ICD-10-CM | POA: Diagnosis not present

## 2015-12-13 DIAGNOSIS — E785 Hyperlipidemia, unspecified: Secondary | ICD-10-CM | POA: Diagnosis not present

## 2015-12-13 DIAGNOSIS — M1991 Primary osteoarthritis, unspecified site: Secondary | ICD-10-CM | POA: Diagnosis not present

## 2015-12-13 DIAGNOSIS — Z7982 Long term (current) use of aspirin: Secondary | ICD-10-CM | POA: Diagnosis not present

## 2015-12-13 DIAGNOSIS — I10 Essential (primary) hypertension: Secondary | ICD-10-CM | POA: Diagnosis not present

## 2015-12-13 DIAGNOSIS — H9190 Unspecified hearing loss, unspecified ear: Secondary | ICD-10-CM | POA: Diagnosis not present

## 2015-12-13 DIAGNOSIS — S82841D Displaced bimalleolar fracture of right lower leg, subsequent encounter for closed fracture with routine healing: Secondary | ICD-10-CM | POA: Diagnosis not present

## 2015-12-13 DIAGNOSIS — K219 Gastro-esophageal reflux disease without esophagitis: Secondary | ICD-10-CM | POA: Diagnosis not present

## 2015-12-16 ENCOUNTER — Telehealth: Payer: Self-pay

## 2015-12-16 MED ORDER — GLUCOSE BLOOD VI STRP: ORAL_STRIP | Status: DC

## 2015-12-16 NOTE — Telephone Encounter (Signed)
Patient stated that her test [facility] [address]

## 2015-12-16 NOTE — Telephone Encounter (Signed)
Pt needed a refill on test strips. Strips filled

## 2015-12-16 NOTE — Telephone Encounter (Signed)
[facility]

## 2015-12-17 ENCOUNTER — Telehealth: Payer: Self-pay | Admitting: Internal Medicine

## 2015-12-17 ENCOUNTER — Other Ambulatory Visit: Payer: Self-pay

## 2015-12-17 MED ORDER — GLUCOSE BLOOD VI STRP: ORAL_STRIP | Status: DC

## 2015-12-17 NOTE — Telephone Encounter (Signed)
Gave a verbal to change to a One Touch and instructions for three times daily before meals.

## 2015-12-17 NOTE — Telephone Encounter (Signed)
Patient called about her diabetic test strips. Called back and left voicemail to call back to clarify.

## 2015-12-17 NOTE — Telephone Encounter (Signed)
Prescription was sent to the pharmacy, they would not fill it b/c did not have a diagnosis code on it. Correction made and resent to pharmacy

## 2015-12-17 NOTE — Telephone Encounter (Signed)
[facility] called stating that pt insurance only covers One Touch for strips and lancets. For Medicare they have to have pt usage. Thank you!

## 2015-12-20 ENCOUNTER — Other Ambulatory Visit: Payer: Self-pay

## 2015-12-20 DIAGNOSIS — E039 Hypothyroidism, unspecified: Secondary | ICD-10-CM | POA: Diagnosis not present

## 2015-12-20 DIAGNOSIS — E119 Type 2 diabetes mellitus without complications: Secondary | ICD-10-CM | POA: Diagnosis not present

## 2015-12-20 DIAGNOSIS — Z7982 Long term (current) use of aspirin: Secondary | ICD-10-CM | POA: Diagnosis not present

## 2015-12-20 DIAGNOSIS — K219 Gastro-esophageal reflux disease without esophagitis: Secondary | ICD-10-CM | POA: Diagnosis not present

## 2015-12-20 DIAGNOSIS — I10 Essential (primary) hypertension: Secondary | ICD-10-CM | POA: Diagnosis not present

## 2015-12-20 DIAGNOSIS — S82841D Displaced bimalleolar fracture of right lower leg, subsequent encounter for closed fracture with routine healing: Secondary | ICD-10-CM | POA: Diagnosis not present

## 2015-12-20 DIAGNOSIS — Z794 Long term (current) use of insulin: Secondary | ICD-10-CM | POA: Diagnosis not present

## 2015-12-20 DIAGNOSIS — M1991 Primary osteoarthritis, unspecified site: Secondary | ICD-10-CM | POA: Diagnosis not present

## 2015-12-20 DIAGNOSIS — Z7984 Long term (current) use of oral hypoglycemic drugs: Secondary | ICD-10-CM | POA: Diagnosis not present

## 2015-12-20 DIAGNOSIS — H9190 Unspecified hearing loss, unspecified ear: Secondary | ICD-10-CM | POA: Diagnosis not present

## 2015-12-20 DIAGNOSIS — E785 Hyperlipidemia, unspecified: Secondary | ICD-10-CM | POA: Diagnosis not present

## 2015-12-20 DIAGNOSIS — F4322 Adjustment disorder with anxiety: Secondary | ICD-10-CM | POA: Diagnosis not present

## 2015-12-20 DIAGNOSIS — Z8673 Personal history of transient ischemic attack (TIA), and cerebral infarction without residual deficits: Secondary | ICD-10-CM | POA: Diagnosis not present

## 2015-12-20 MED ORDER — GLUCOSE BLOOD VI STRP: ORAL_STRIP | Status: DC

## 2015-12-22 DIAGNOSIS — I10 Essential (primary) hypertension: Secondary | ICD-10-CM | POA: Diagnosis not present

## 2015-12-22 DIAGNOSIS — E039 Hypothyroidism, unspecified: Secondary | ICD-10-CM

## 2015-12-22 DIAGNOSIS — S82841D Displaced bimalleolar fracture of right lower leg, subsequent encounter for closed fracture with routine healing: Secondary | ICD-10-CM | POA: Diagnosis not present

## 2015-12-22 DIAGNOSIS — E119 Type 2 diabetes mellitus without complications: Secondary | ICD-10-CM | POA: Diagnosis not present

## 2015-12-22 DIAGNOSIS — M1991 Primary osteoarthritis, unspecified site: Secondary | ICD-10-CM | POA: Diagnosis not present

## 2015-12-22 DIAGNOSIS — E785 Hyperlipidemia, unspecified: Secondary | ICD-10-CM

## 2015-12-27 DIAGNOSIS — Z7982 Long term (current) use of aspirin: Secondary | ICD-10-CM | POA: Diagnosis not present

## 2015-12-27 DIAGNOSIS — I10 Essential (primary) hypertension: Secondary | ICD-10-CM | POA: Diagnosis not present

## 2015-12-27 DIAGNOSIS — Z7984 Long term (current) use of oral hypoglycemic drugs: Secondary | ICD-10-CM | POA: Diagnosis not present

## 2015-12-27 DIAGNOSIS — F4322 Adjustment disorder with anxiety: Secondary | ICD-10-CM | POA: Diagnosis not present

## 2015-12-27 DIAGNOSIS — E119 Type 2 diabetes mellitus without complications: Secondary | ICD-10-CM | POA: Diagnosis not present

## 2015-12-27 DIAGNOSIS — Z8673 Personal history of transient ischemic attack (TIA), and cerebral infarction without residual deficits: Secondary | ICD-10-CM | POA: Diagnosis not present

## 2015-12-27 DIAGNOSIS — Z794 Long term (current) use of insulin: Secondary | ICD-10-CM | POA: Diagnosis not present

## 2015-12-27 DIAGNOSIS — M1991 Primary osteoarthritis, unspecified site: Secondary | ICD-10-CM | POA: Diagnosis not present

## 2015-12-27 DIAGNOSIS — E785 Hyperlipidemia, unspecified: Secondary | ICD-10-CM | POA: Diagnosis not present

## 2015-12-27 DIAGNOSIS — K219 Gastro-esophageal reflux disease without esophagitis: Secondary | ICD-10-CM | POA: Diagnosis not present

## 2015-12-27 DIAGNOSIS — S82841D Displaced bimalleolar fracture of right lower leg, subsequent encounter for closed fracture with routine healing: Secondary | ICD-10-CM | POA: Diagnosis not present

## 2015-12-27 DIAGNOSIS — E039 Hypothyroidism, unspecified: Secondary | ICD-10-CM | POA: Diagnosis not present

## 2015-12-27 DIAGNOSIS — H9190 Unspecified hearing loss, unspecified ear: Secondary | ICD-10-CM | POA: Diagnosis not present

## 2016-01-03 DIAGNOSIS — E119 Type 2 diabetes mellitus without complications: Secondary | ICD-10-CM | POA: Diagnosis not present

## 2016-01-03 DIAGNOSIS — E785 Hyperlipidemia, unspecified: Secondary | ICD-10-CM | POA: Diagnosis not present

## 2016-01-03 DIAGNOSIS — Z7984 Long term (current) use of oral hypoglycemic drugs: Secondary | ICD-10-CM | POA: Diagnosis not present

## 2016-01-03 DIAGNOSIS — Z794 Long term (current) use of insulin: Secondary | ICD-10-CM | POA: Diagnosis not present

## 2016-01-03 DIAGNOSIS — K219 Gastro-esophageal reflux disease without esophagitis: Secondary | ICD-10-CM | POA: Diagnosis not present

## 2016-01-03 DIAGNOSIS — Z7982 Long term (current) use of aspirin: Secondary | ICD-10-CM | POA: Diagnosis not present

## 2016-01-03 DIAGNOSIS — I10 Essential (primary) hypertension: Secondary | ICD-10-CM | POA: Diagnosis not present

## 2016-01-03 DIAGNOSIS — F4322 Adjustment disorder with anxiety: Secondary | ICD-10-CM | POA: Diagnosis not present

## 2016-01-03 DIAGNOSIS — Z8673 Personal history of transient ischemic attack (TIA), and cerebral infarction without residual deficits: Secondary | ICD-10-CM | POA: Diagnosis not present

## 2016-01-03 DIAGNOSIS — M1711 Unilateral primary osteoarthritis, right knee: Secondary | ICD-10-CM | POA: Diagnosis not present

## 2016-01-03 DIAGNOSIS — S82841D Displaced bimalleolar fracture of right lower leg, subsequent encounter for closed fracture with routine healing: Secondary | ICD-10-CM | POA: Diagnosis not present

## 2016-01-03 DIAGNOSIS — M1991 Primary osteoarthritis, unspecified site: Secondary | ICD-10-CM | POA: Diagnosis not present

## 2016-01-03 DIAGNOSIS — E039 Hypothyroidism, unspecified: Secondary | ICD-10-CM | POA: Diagnosis not present

## 2016-01-03 DIAGNOSIS — H9190 Unspecified hearing loss, unspecified ear: Secondary | ICD-10-CM | POA: Diagnosis not present

## 2016-01-06 DIAGNOSIS — H6123 Impacted cerumen, bilateral: Secondary | ICD-10-CM | POA: Diagnosis not present

## 2016-01-06 DIAGNOSIS — H903 Sensorineural hearing loss, bilateral: Secondary | ICD-10-CM | POA: Diagnosis not present

## 2016-01-10 DIAGNOSIS — Z7982 Long term (current) use of aspirin: Secondary | ICD-10-CM | POA: Diagnosis not present

## 2016-01-10 DIAGNOSIS — S82841D Displaced bimalleolar fracture of right lower leg, subsequent encounter for closed fracture with routine healing: Secondary | ICD-10-CM | POA: Diagnosis not present

## 2016-01-10 DIAGNOSIS — H9190 Unspecified hearing loss, unspecified ear: Secondary | ICD-10-CM | POA: Diagnosis not present

## 2016-01-10 DIAGNOSIS — Z8673 Personal history of transient ischemic attack (TIA), and cerebral infarction without residual deficits: Secondary | ICD-10-CM | POA: Diagnosis not present

## 2016-01-10 DIAGNOSIS — F4322 Adjustment disorder with anxiety: Secondary | ICD-10-CM | POA: Diagnosis not present

## 2016-01-10 DIAGNOSIS — E119 Type 2 diabetes mellitus without complications: Secondary | ICD-10-CM | POA: Diagnosis not present

## 2016-01-10 DIAGNOSIS — K219 Gastro-esophageal reflux disease without esophagitis: Secondary | ICD-10-CM | POA: Diagnosis not present

## 2016-01-10 DIAGNOSIS — Z794 Long term (current) use of insulin: Secondary | ICD-10-CM | POA: Diagnosis not present

## 2016-01-10 DIAGNOSIS — E785 Hyperlipidemia, unspecified: Secondary | ICD-10-CM | POA: Diagnosis not present

## 2016-01-10 DIAGNOSIS — E039 Hypothyroidism, unspecified: Secondary | ICD-10-CM | POA: Diagnosis not present

## 2016-01-10 DIAGNOSIS — Z7984 Long term (current) use of oral hypoglycemic drugs: Secondary | ICD-10-CM | POA: Diagnosis not present

## 2016-01-10 DIAGNOSIS — I10 Essential (primary) hypertension: Secondary | ICD-10-CM | POA: Diagnosis not present

## 2016-01-10 DIAGNOSIS — M1991 Primary osteoarthritis, unspecified site: Secondary | ICD-10-CM | POA: Diagnosis not present

## 2016-01-12 DIAGNOSIS — M1711 Unilateral primary osteoarthritis, right knee: Secondary | ICD-10-CM | POA: Diagnosis not present

## 2016-02-15 DIAGNOSIS — M1711 Unilateral primary osteoarthritis, right knee: Secondary | ICD-10-CM | POA: Diagnosis not present

## 2016-03-03 NOTE — Telephone Encounter (Signed)
error 

## 2016-03-13 ENCOUNTER — Telehealth: Payer: Self-pay | Admitting: *Deleted

## 2016-03-13 ENCOUNTER — Other Ambulatory Visit: Payer: Self-pay | Admitting: *Deleted

## 2016-03-13 DIAGNOSIS — Z760 Encounter for issue of repeat prescription: Secondary | ICD-10-CM

## 2016-03-13 MED ORDER — OMEPRAZOLE 20 MG PO CPDR: 20.0000 mg | DELAYED_RELEASE_CAPSULE | Freq: Every day | ORAL | Status: DC

## 2016-03-13 MED ORDER — GLIMEPIRIDE 4 MG PO TABS: ORAL_TABLET | ORAL | Status: DC

## 2016-03-13 NOTE — Telephone Encounter (Signed)
Patient's daughter has requested a medication refill for glimepiride and omeprazole [facility]  Please call daughter when Rx is sent over  [place] [phone]

## 2016-03-13 NOTE — Telephone Encounter (Signed)
Called daughter and left message of Rx sent to pharmacy.

## 2016-03-13 NOTE — Telephone Encounter (Signed)
Patient's daughter will call back with medications, that need to be refilled

## 2016-03-13 NOTE — Telephone Encounter (Signed)
Last OV 10/18/15  Upcoming appointment on 04/04/16 has been cancelled by patient   Medication pended for your approval  I will call daughter and notify Rx sent over. Does she need to reschedule at this time?

## 2016-03-20 ENCOUNTER — Ambulatory Visit: Payer: PPO | Admitting: Internal Medicine

## 2016-03-22 ENCOUNTER — Ambulatory Visit: Payer: PPO | Admitting: Internal Medicine

## 2016-03-24 ENCOUNTER — Encounter: Payer: Self-pay | Admitting: Internal Medicine

## 2016-03-24 ENCOUNTER — Telehealth: Payer: Self-pay

## 2016-03-24 ENCOUNTER — Telehealth: Payer: Self-pay | Admitting: Internal Medicine

## 2016-03-24 ENCOUNTER — Ambulatory Visit (INDEPENDENT_AMBULATORY_CARE_PROVIDER_SITE_OTHER): Payer: PPO | Admitting: Internal Medicine

## 2016-03-24 VITALS — BP 120/68 | HR 89 | Ht 61.0 in | Wt 199.6 lb

## 2016-03-24 DIAGNOSIS — I10 Essential (primary) hypertension: Secondary | ICD-10-CM | POA: Diagnosis not present

## 2016-03-24 DIAGNOSIS — G451 Carotid artery syndrome (hemispheric): Secondary | ICD-10-CM

## 2016-03-24 DIAGNOSIS — E785 Hyperlipidemia, unspecified: Secondary | ICD-10-CM | POA: Diagnosis not present

## 2016-03-24 DIAGNOSIS — E039 Hypothyroidism, unspecified: Secondary | ICD-10-CM | POA: Diagnosis not present

## 2016-03-24 DIAGNOSIS — E1165 Type 2 diabetes mellitus with hyperglycemia: Secondary | ICD-10-CM

## 2016-03-24 DIAGNOSIS — Z760 Encounter for issue of repeat prescription: Secondary | ICD-10-CM

## 2016-03-24 DIAGNOSIS — IMO0001 Reserved for inherently not codable concepts without codable children: Secondary | ICD-10-CM

## 2016-03-24 DIAGNOSIS — Z794 Long term (current) use of insulin: Secondary | ICD-10-CM

## 2016-03-24 LAB — COMPREHENSIVE METABOLIC PANEL
ALBUMIN: 3.8 g/dL (ref 3.5–5.2)
ALT: 13 U/L (ref 0–35)
AST: 11 U/L (ref 0–37)
Alkaline Phosphatase: 138 U/L — ABNORMAL HIGH (ref 39–117)
BILIRUBIN TOTAL: 0.3 mg/dL (ref 0.2–1.2)
BUN: 14 mg/dL (ref 6–23)
CALCIUM: 9.4 mg/dL (ref 8.4–10.5)
CHLORIDE: 104 meq/L (ref 96–112)
CO2: 29 mEq/L (ref 19–32)
CREATININE: 0.85 mg/dL (ref 0.40–1.20)
GFR: 68.14 mL/min (ref >60.00)
Glucose, Bld: 162 mg/dL — ABNORMAL HIGH (ref 70–99)
Potassium: 4.7 mEq/L (ref 3.5–5.1)
Sodium: 139 mEq/L (ref 135–145)
Total Protein: 6.2 g/dL (ref 6.0–8.3)

## 2016-03-24 LAB — TSH: TSH: 3.26 u[IU]/mL (ref 0.35–4.50)

## 2016-03-24 LAB — HEMOGLOBIN A1C: Hgb A1c MFr Bld: 7.8 % — ABNORMAL HIGH (ref 4.6–6.5)

## 2016-03-24 MED ORDER — LOSARTAN POTASSIUM 50 MG PO TABS: 50.0000 mg | ORAL_TABLET | Freq: Every day | ORAL | Status: DC

## 2016-03-24 MED ORDER — INSULIN GLARGINE 100 UNIT/ML SOLOSTAR PEN: PEN_INJECTOR | SUBCUTANEOUS | Status: DC

## 2016-03-24 MED ORDER — METOPROLOL SUCCINATE ER 100 MG PO TB24: 100.0000 mg | ORAL_TABLET | Freq: Every day | ORAL | Status: DC

## 2016-03-24 MED ORDER — METFORMIN HCL 1000 MG PO TABS: 1000.0000 mg | ORAL_TABLET | Freq: Two times a day (BID) | ORAL | Status: DC

## 2016-03-24 MED ORDER — LEVOTHYROXINE SODIUM 75 MCG PO TABS: 75.0000 ug | ORAL_TABLET | Freq: Every day | ORAL | Status: DC

## 2016-03-24 MED ORDER — FUROSEMIDE 20 MG PO TABS: ORAL_TABLET | ORAL | Status: DC

## 2016-03-24 MED ORDER — SIMVASTATIN 20 MG PO TABS: 20.0000 mg | ORAL_TABLET | Freq: Every evening | ORAL | Status: DC

## 2016-03-24 MED ORDER — GLUCOSE BLOOD VI STRP: ORAL_STRIP | Status: DC

## 2016-03-24 MED ORDER — GLIMEPIRIDE 4 MG PO TABS: ORAL_TABLET | ORAL | Status: DC

## 2016-03-24 MED ORDER — OMEPRAZOLE 20 MG PO CPDR: 20.0000 mg | DELAYED_RELEASE_CAPSULE | Freq: Every day | ORAL | Status: DC

## 2016-03-24 NOTE — Telephone Encounter (Signed)
Please advise 

## 2016-03-24 NOTE — Telephone Encounter (Signed)
Pt was wondering if Dr. Gilford Rile could write her a letter about not driving no longer. Pt states she has not driven in the last 5 months and she wants to get out of her lease for her car. Pt believes she needs a doctors note not to drive in order to do this. Please contact pt at home phone.

## 2016-03-24 NOTE — Assessment & Plan Note (Signed)
BG well controlled by report. A1c with labs today. Continue current medication.

## 2016-03-24 NOTE — Assessment & Plan Note (Signed)
Will check TSH with labs. Continue Levothyroxine. 

## 2016-03-24 NOTE — Patient Instructions (Addendum)
Labs today.  Follow up in 3 months with Dr. Caryl Bis

## 2016-03-24 NOTE — Telephone Encounter (Signed)
Left message for patient to return call for a letter that will be waiting at the front desk to be picked up by patient.

## 2016-03-24 NOTE — Assessment & Plan Note (Signed)
Previous TIA. She was unable to keep follow up with neurology because of hospitalization for ankle fracture. Will set up follow up with neurology.

## 2016-03-24 NOTE — Telephone Encounter (Signed)
Medication refill to mail services.

## 2016-03-24 NOTE — Progress Notes (Signed)
Subjective:    Patient ID: Diane Mullins, female    DOB: 02-09-1933, 82 y.o.   MRN: [id number]  HPI  82YO female presents for follow up.  Living at [facility] now.  Had right ankle fracture which required surgical repair.  DM - BG near 120s-140s. Compliant with medication.   Lab Results  Component Value Date   HGBA1C 8.2* 09/15/2015   HGBA1C 8.2 09/15/2015   HTN - No CP, HA. Complaint with medication.   Wt Readings from Last 3 Encounters:  03/24/16 199 lb 9.6 oz (90.538 kg)  10/28/15 207 lb (93.895 kg)  10/18/15 207 lb 6 oz (94.065 kg)   BP Readings from Last 3 Encounters:  03/24/16 120/68  11/01/15 134/61  10/18/15 105/65    Past Medical History  Diagnosis Date  . Hyperlipidemia   . Hypertension   . Diabetes mellitus   . Thyroid disease   . H/O: rheumatic fever   . Stroke [facility])     TIA Jan. 1st   Family History  Problem Relation Age of Onset  . Heart disease Mother 39  . Heart attack Mother 43  . Cancer Father     colon  . Diabetes Sister   . Hypertension Sister   . Cancer Brother     kidney  . Diabetes Brother    Past Surgical History  Procedure Laterality Date  . Vaginal delivery      x4  . Abdominal hysterectomy  1973    menorrhagia  . Hernia repair  1994  . Orif ankle fracture Right 10/29/2015    Procedure: OPEN REDUCTION INTERNAL FIXATION (ORIF) ANKLE FRACTURE;  Surgeon: Thornton Park, MD;  Location: [facility] ORS;  Service: Orthopedics;  Laterality: Right;   Social History   Social History  . Marital Status: Widowed    Spouse Name: N/A  . Number of Children: 4  . Years of Education: N/A   Social History Main Topics  . Smoking status: Never Smoker   . Smokeless tobacco: Never Used  . Alcohol Use: No  . Drug Use: No  . Sexual Activity: No   Other Topics Concern  . None   Social History Narrative   Lives in [place] alone. No pets. Retired from [employer].   Diet: healthy   Exercise: water aerobics twice weekly, balance class   Ambulates with a walker at baseline    Review of Systems  Constitutional: Negative for fever, chills, appetite change, fatigue and unexpected weight change.  Eyes: Negative for visual disturbance.  Respiratory: Negative for cough, chest tightness and shortness of breath.   Cardiovascular: Negative for chest pain and leg swelling.  Gastrointestinal: Negative for nausea, vomiting, abdominal pain, diarrhea and constipation.  Musculoskeletal: Negative for myalgias and arthralgias.  Skin: Negative for color change and rash.  Hematological: Negative for adenopathy. Does not bruise/bleed easily.  Psychiatric/Behavioral: Negative for sleep disturbance and dysphoric mood. The patient is not nervous/anxious.        Objective:    BP 120/68 mmHg  Pulse 89  Ht 5\' 1"  (1.549 m)  Wt 199 lb 9.6 oz (90.538 kg)  BMI 37.73 kg/m2  SpO2 94% Physical Exam  Constitutional: She is oriented to person, place, and time. She appears well-developed and well-nourished. No distress.  HENT:  Head: Normocephalic and atraumatic.  Right Ear: External ear normal.  Left Ear: External ear normal.  Nose: Nose normal.  Mouth/Throat: Oropharynx is clear and moist. No oropharyngeal exudate.  Eyes: Conjunctivae are normal. Pupils are equal,  round, and reactive to light. Right eye exhibits no discharge. Left eye exhibits no discharge. No scleral icterus.  Neck: Normal range of motion. Neck supple. No tracheal deviation present. No thyromegaly present.  Cardiovascular: Normal rate, regular rhythm, normal heart sounds and intact distal pulses.  Exam reveals no gallop and no friction rub.   No murmur heard. Pulmonary/Chest: Effort normal and breath sounds normal. No respiratory distress. She has no wheezes. She has no rales. She exhibits no tenderness.  Musculoskeletal: Normal range of motion. She exhibits no edema or tenderness.  Lymphadenopathy:    She has no cervical adenopathy.  Neurological: She is alert and oriented  to person, place, and time. No cranial nerve deficit. She exhibits normal muscle tone. Coordination normal.  Skin: Skin is warm and dry. No rash noted. She is not diaphoretic. No erythema. No pallor.  Psychiatric: She has a normal mood and affect. Her behavior is normal. Judgment and thought content normal.          Assessment & Plan:   Problem List Items Addressed This Visit      Unprioritized   Hyperlipidemia (Chronic)    Will check LFTs with labs. Continue Simvastatin. Fasting lipids next visit.      Hypertension (Chronic)    BP Readings from Last 3 Encounters:  03/24/16 120/68  11/01/15 134/61  10/18/15 105/65   BP well controlled. Renal function with labs. Continue current medications.      Hypothyroidism    Will check TSH with labs.Continue Levothyroxine.      Relevant Orders   TSH   TIA (transient ischemic attack)    Previous TIA. She was unable to keep follow up with neurology because of hospitalization for ankle fracture. Will set up follow up with neurology.      Type 2 diabetes mellitus, uncontrolled (HCC) - Primary (Chronic)    BG well controlled by report. A1c with labs today. Continue current medication.      Relevant Orders   Comprehensive metabolic panel   Hemoglobin A1c       Return in about 3 months (around 06/24/2016) for Recheck of Diabetes, New Patient.  Ronette Deter, MD Internal Medicine [facility]

## 2016-03-24 NOTE — Telephone Encounter (Signed)
Please advise if Dr.Walker would be able to do this for pt?

## 2016-03-24 NOTE — Telephone Encounter (Signed)
Fine to write a note that pt can no-longer drive because of TIA

## 2016-03-24 NOTE — Progress Notes (Signed)
Pre visit review using our clinic review tool, if applicable. No additional management support is needed unless otherwise documented below in the visit note. 

## 2016-03-24 NOTE — Assessment & Plan Note (Signed)
Will check LFTs with labs. Continue Simvastatin. Fasting lipids next visit.

## 2016-03-24 NOTE — Assessment & Plan Note (Signed)
BP Readings from Last 3 Encounters:  03/24/16 120/68  11/01/15 134/61  10/18/15 105/65   BP well controlled. Renal function with labs. Continue current medications.

## 2016-03-27 ENCOUNTER — Telehealth: Payer: Self-pay | Admitting: *Deleted

## 2016-03-27 NOTE — Telephone Encounter (Signed)
Please advise for dose of Furosemide, it previously was 20mg , take 1 tablet, now it is 20mg , take 1/2 tablet.  thanks

## 2016-03-27 NOTE — Telephone Encounter (Signed)
[facility] has requested a medication clarity for furosemide  [phone] [place]

## 2016-03-27 NOTE — Telephone Encounter (Signed)
This may have been changed by another provider? Fine to continue 1/2 tablet daily, 10mg 

## 2016-03-27 NOTE — Telephone Encounter (Signed)
Left a secured VM for Diane Mullins with correct dosage for Furosemide. thanks

## 2016-03-28 ENCOUNTER — Telehealth: Payer: Self-pay | Admitting: *Deleted

## 2016-03-28 NOTE — Telephone Encounter (Signed)
[facility] has requested a change on the Rx sent over for glucose test strips. They have requested one touch glucose test stripes for a 90 day supply. Fax [phone]

## 2016-03-29 MED ORDER — GLUCOSE BLOOD VI STRP: ORAL_STRIP | Status: DC

## 2016-03-29 NOTE — Telephone Encounter (Signed)
[facility] requested to have this re-faxed, please leave Rx generic,for pt choice.  300 test strips for 90 days and 3 refills  [phone]-for a verbal order

## 2016-03-29 NOTE — Telephone Encounter (Signed)
Faxed thanks!

## 2016-03-29 NOTE — Addendum Note (Signed)
Addended by: Bevelyn Ngo on: 03/29/2016 03:20 PM   Modules accepted: Orders

## 2016-03-29 NOTE — Telephone Encounter (Signed)
Resent

## 2016-03-30 ENCOUNTER — Encounter: Payer: Self-pay | Admitting: Emergency Medicine

## 2016-03-30 ENCOUNTER — Emergency Department
Admission: EM | Admit: 2016-03-30 | Discharge: 2016-03-30 | Disposition: A | Discharge status: Home / Self Care | Payer: PPO | Attending: Emergency Medicine | Admitting: Emergency Medicine

## 2016-03-30 ENCOUNTER — Emergency Department: Payer: PPO

## 2016-03-30 DIAGNOSIS — E119 Type 2 diabetes mellitus without complications: Secondary | ICD-10-CM | POA: Diagnosis not present

## 2016-03-30 DIAGNOSIS — Y9289 Other specified places as the place of occurrence of the external cause: Secondary | ICD-10-CM | POA: Diagnosis not present

## 2016-03-30 DIAGNOSIS — Y999 Unspecified external cause status: Secondary | ICD-10-CM | POA: Insufficient documentation

## 2016-03-30 DIAGNOSIS — Z7984 Long term (current) use of oral hypoglycemic drugs: Secondary | ICD-10-CM | POA: Insufficient documentation

## 2016-03-30 DIAGNOSIS — Z7982 Long term (current) use of aspirin: Secondary | ICD-10-CM | POA: Diagnosis not present

## 2016-03-30 DIAGNOSIS — S0990XA Unspecified injury of head, initial encounter: Secondary | ICD-10-CM

## 2016-03-30 DIAGNOSIS — Y9301 Activity, walking, marching and hiking: Secondary | ICD-10-CM | POA: Insufficient documentation

## 2016-03-30 DIAGNOSIS — E785 Hyperlipidemia, unspecified: Secondary | ICD-10-CM | POA: Diagnosis not present

## 2016-03-30 DIAGNOSIS — W19XXXA Unspecified fall, initial encounter: Secondary | ICD-10-CM

## 2016-03-30 DIAGNOSIS — I10 Essential (primary) hypertension: Secondary | ICD-10-CM | POA: Insufficient documentation

## 2016-03-30 DIAGNOSIS — Z8673 Personal history of transient ischemic attack (TIA), and cerebral infarction without residual deficits: Secondary | ICD-10-CM | POA: Diagnosis not present

## 2016-03-30 DIAGNOSIS — W01190A Fall on same level from slipping, tripping and stumbling with subsequent striking against furniture, initial encounter: Secondary | ICD-10-CM | POA: Insufficient documentation

## 2016-03-30 DIAGNOSIS — Z794 Long term (current) use of insulin: Secondary | ICD-10-CM | POA: Insufficient documentation

## 2016-03-30 DIAGNOSIS — Z79899 Other long term (current) drug therapy: Secondary | ICD-10-CM | POA: Diagnosis not present

## 2016-03-30 DIAGNOSIS — S0003XA Contusion of scalp, initial encounter: Secondary | ICD-10-CM | POA: Diagnosis not present

## 2016-03-30 NOTE — ED Notes (Signed)
Pt presents with reports of tripping while placing a robe on the chair. Pt states she hit her head on a table. Pt denies LOC. Pt denies feeling dizzy or lightheaded. Pt denies taking anticoagulants.

## 2016-03-30 NOTE — Discharge Instructions (Signed)
Contusion A contusion is a deep bruise. Contusions are the result of a blunt injury to tissues and muscle fibers under the skin. The injury causes bleeding under the skin. The skin overlying the contusion may turn blue, purple, or yellow. Minor injuries will give you a painless contusion, but more severe contusions may stay painful and swollen for a few weeks.  CAUSES  This condition is usually caused by a blow, trauma, or direct force to an area of the body. SYMPTOMS  Symptoms of this condition include:  Swelling of the injured area.  Pain and tenderness in the injured area.  Discoloration. The area may have redness and then turn blue, purple, or yellow. DIAGNOSIS  This condition is diagnosed based on a physical exam and medical history. An X-ray, CT scan, or MRI may be needed to determine if there are any associated injuries, such as broken bones (fractures). TREATMENT  Specific treatment for this condition depends on what area of the body was injured. In general, the best treatment for a contusion is resting, icing, applying pressure to (compression), and elevating the injured area. This is often called the RICE strategy. Over-the-counter anti-inflammatory medicines may also be recommended for pain control.  HOME CARE INSTRUCTIONS   Rest the injured area.  If directed, apply ice to the injured area:  Put ice in a plastic bag.  Place a towel between your skin and the bag.  Leave the ice on for 20 minutes, 2-3 times per day.  If directed, apply light compression to the injured area using an elastic bandage. Make sure the bandage is not wrapped too tightly. Remove and reapply the bandage as directed by your health care provider.  If possible, raise (elevate) the injured area above the level of your heart while you are sitting or lying down.  Take over-the-counter and prescription medicines only as told by your health care provider. SEEK MEDICAL CARE IF:  Your symptoms do not  improve after several days of treatment.  Your symptoms get worse.  You have difficulty moving the injured area. SEEK IMMEDIATE MEDICAL CARE IF:   You have severe pain.  You have numbness in a hand or foot.  Your hand or foot turns pale or cold.   This information is not intended to replace advice given to you by your health care provider. Make sure you discuss any questions you have with your health care provider.   Document Released: 06/14/2005 Document Revised: 05/26/2015 Document Reviewed: 01/20/2015 Elsevier Interactive Patient Education 2016 [place].  Cryotherapy Cryotherapy means treatment with cold. Ice or gel packs can be used to reduce both pain and swelling. Ice is the most helpful within the first 24 to 48 hours after an injury or flare-up from overusing a muscle or joint. Sprains, strains, spasms, burning pain, shooting pain, and aches can all be eased with ice. Ice can also be used when recovering from surgery. Ice is effective, has very few side effects, and is safe for most people to use. PRECAUTIONS  Ice is not a safe treatment option for people with:  Raynaud phenomenon. This is a condition affecting small blood vessels in the extremities. Exposure to cold may cause your problems to return.  Cold hypersensitivity. There are many forms of cold hypersensitivity, including:  Cold urticaria. Red, itchy hives appear on the skin when the tissues begin to warm after being iced.  Cold erythema. This is a red, itchy rash caused by exposure to cold.  Cold hemoglobinuria. Red blood cells  break down when the tissues begin to warm after being iced. The hemoglobin that carry oxygen are passed into the urine because they cannot combine with blood proteins fast enough. °· Numbness or altered sensitivity in the area being iced. °If you have any of the following conditions, do not use ice until you have discussed cryotherapy with your caregiver: °· Heart conditions, such as  arrhythmia, angina, or chronic heart disease. °· High blood pressure. °· Healing wounds or open skin in the area being iced. °· Current infections. °· Rheumatoid arthritis. °· Poor circulation. °· Diabetes. °Ice slows the blood flow in the region it is applied. This is beneficial when trying to stop inflamed tissues from spreading irritating chemicals to surrounding tissues. However, if you expose your skin to cold temperatures for too long or without the proper protection, you can damage your skin or nerves. Watch for signs of skin damage due to cold. °HOME CARE INSTRUCTIONS °Follow these tips to use ice and cold packs safely. °· Place a dry or damp towel between the ice and skin. A damp towel will cool the skin more quickly, so you may need to shorten the time that the ice is used. °· For a more rapid response, add gentle compression to the ice. °· Ice for no more than 10 to 20 minutes at a time. The bonier the area you are icing, the less time it will take to get the benefits of ice. °· Check your skin after 5 minutes to make sure there are no signs of a poor response to cold or skin damage. °· Rest 20 minutes or more between uses. °· Once your skin is numb, you can end your treatment. You can test numbness by very lightly touching your skin. The touch should be so light that you do not see the skin dimple from the pressure of your fingertip. When using ice, most people will feel these normal sensations in this order: cold, burning, aching, and numbness. °· Do not use ice on someone who cannot communicate their responses to pain, such as small children or people with dementia. °HOW TO MAKE AN ICE PACK °Ice packs are the most common way to use ice therapy. Other methods include ice massage, ice baths, and cryosprays. Muscle creams that cause a cold, tingly feeling do not offer the same benefits that ice offers and should not be used as a substitute unless recommended by your caregiver. °To make an ice pack, do one  of the following: °· Place crushed ice or a bag of frozen vegetables in a sealable plastic bag. Squeeze out the excess air. Place this bag inside another plastic bag. Slide the bag into a pillowcase or place a damp towel between your skin and the bag. °· Mix 3 parts water with 1 part rubbing alcohol. Freeze the mixture in a sealable plastic bag. When you remove the mixture from the freezer, it will be slushy. Squeeze out the excess air. Place this bag inside another plastic bag. Slide the bag into a pillowcase or place a damp towel between your skin and the bag. °SEEK MEDICAL CARE IF: °· You develop white spots on your skin. This may give the skin a blotchy (mottled) appearance. °· Your skin turns blue or pale. °· Your skin becomes waxy or hard. °· Your swelling gets worse. °MAKE SURE YOU:  °· Understand these instructions. °· Will watch your condition. °· Will get help right away if you are not doing well or get worse. °  °  This information is not intended to replace advice given to you by your health care provider. Make sure you discuss any questions you have with your health care provider.   Document Released: 05/01/2011 Document Revised: 09/25/2014 Document Reviewed: 05/01/2011 Elsevier Interactive Patient Education 2016 [employer]  Facial or Scalp Contusion A facial or scalp contusion is a deep bruise on the face or head. Injuries to the face and head generally cause a lot of swelling, especially around the eyes. Contusions are the result of an injury that caused bleeding under the skin. The contusion may turn blue, purple, or yellow. Minor injuries will give you a painless contusion, but more severe contusions may stay painful and swollen for a few weeks.  CAUSES  A facial or scalp contusion is caused by a blunt injury or trauma to the face or head area.  SIGNS AND SYMPTOMS   Swelling of the injured area.   Discoloration of the injured area.   Tenderness, soreness, or pain in the injured  area.  DIAGNOSIS  The diagnosis can be made by taking a medical history and doing a physical exam. An X-ray exam, CT scan, or MRI may be needed to determine if there are any associated injuries, such as broken bones (fractures). TREATMENT  Often, the best treatment for a facial or scalp contusion is applying cold compresses to the injured area. Over-the-counter medicines may also be recommended for pain control.  HOME CARE INSTRUCTIONS   Only take over-the-counter or prescription medicines as directed by your health care provider.   Apply ice to the injured area.   Put ice in a plastic bag.   Place a towel between your skin and the bag.   Leave the ice on for 20 minutes, 2-3 times a day.  SEEK MEDICAL CARE IF:  You have bite problems.   You have pain with chewing.   You are concerned about facial defects. SEEK IMMEDIATE MEDICAL CARE IF:  You have severe pain or a headache that is not relieved by medicine.   You have unusual sleepiness, confusion, or personality changes.   You throw up (vomit).   You have a persistent nosebleed.   You have double vision or blurred vision.   You have fluid drainage from your nose or ear.   You have difficulty walking or using your arms or legs.  MAKE SURE YOU:   Understand these instructions.  Will watch your condition.  Will get help right away if you are not doing well or get worse.   This information is not intended to replace advice given to you by your health care provider. Make sure you discuss any questions you have with your health care provider.   Document Released: 10/12/2004 Document Revised: 09/25/2014 Document Reviewed: 04/17/2013 Elsevier Interactive Patient Education [employer].

## 2016-03-30 NOTE — ED Notes (Signed)
See triage note  States she fell backwards and hit table  Denies any LOC  But has large hematoma to back of head  Was able to get up  And then walked to dining hall

## 2016-03-30 NOTE — ED Provider Notes (Signed)
CSN: [id number]     Arrival date & time 03/30/16  1709 History   First MD Initiated Contact with Patient 03/30/16 1752     Chief Complaint  Patient presents with  . Fall     (Consider location/radiation/quality/duration/timing/severity/associated sxs/prior Treatment) HPI  82 year old female presents to the emergency department for evaluation of hematoma to the back of her head. Patient states that 11:30 AM this morning, she lost her balance as she was placing something on the back of her chair, he fell backwards landing on her buttocks and hitting her head on the table. The reason she felt was due to balance issues, she states she's been having issues with balance she denies any loss of consciousness/dizziness prior to or after falling. She also denies any chest pain shortness of breath or palpitations. Patient denies any headaches, states she has a tender area to the back of her head along the occipital region. No nausea vomiting, neck, lower back, hip or knee pain. She is able ambulate with no assisted devices. Patient takes 325 mg of aspirin daily.  Past Medical History  Diagnosis Date  . Hyperlipidemia   . Hypertension   . Diabetes mellitus   . Thyroid disease   . H/O: rheumatic fever   . Stroke [facility])     TIA Jan. 1st   Past Surgical History  Procedure Laterality Date  . Vaginal delivery      x4  . Abdominal hysterectomy  1973    menorrhagia  . Hernia repair  1994  . Orif ankle fracture Right 10/29/2015    Procedure: OPEN REDUCTION INTERNAL FIXATION (ORIF) ANKLE FRACTURE;  Surgeon: Thornton Park, MD;  Location: [facility] ORS;  Service: Orthopedics;  Laterality: Right;   Family History  Problem Relation Age of Onset  . Heart disease Mother 64  . Heart attack Mother 33  . Cancer Father     colon  . Diabetes Sister   . Hypertension Sister   . Cancer Brother     kidney  . Diabetes Brother    Social History  Substance Use Topics  . Smoking status: Never Smoker   . Smokeless  tobacco: Never Used  . Alcohol Use: No   OB History    No data available     Review of Systems  Constitutional: Negative for fever, chills, activity change and fatigue.  HENT: Negative for congestion, sinus pressure and sore throat.   Eyes: Negative for visual disturbance.  Respiratory: Negative for cough, chest tightness and shortness of breath.   Cardiovascular: Negative for chest pain and leg swelling.  Gastrointestinal: Negative for nausea, vomiting, abdominal pain and diarrhea.  Genitourinary: Negative for dysuria.  Musculoskeletal: Negative for back pain, arthralgias, gait problem and neck pain.  Skin: Negative for rash.  Neurological: Positive for headaches (Posterior occipital hematoma). Negative for dizziness, weakness and numbness.  Hematological: Negative for adenopathy.  Psychiatric/Behavioral: Negative for behavioral problems, confusion and agitation.      Allergies  Review of patient's allergies indicates no known allergies.  Home Medications   Prior to Admission medications   Medication Sig Start Date End Date Taking? Authorizing Provider  ALPRAZolam (XANAX) 0.25 MG tablet Take 1 tablet (0.25 mg total) by mouth 3 (three) times daily as needed for sleep or anxiety. 11/01/15   Henreitta Leber, MD  aspirin 325 MG tablet Take 1 tablet (325 mg total) by mouth daily. 09/20/15   Epifanio Lesches, MD  calcium-vitamin D (OSCAL) 250-125 MG-UNIT per tablet Take 1 tablet by mouth  daily.    Historical Provider, MD  cholecalciferol (VITAMIN D) 1000 units tablet Take 1,000 Units by mouth daily.    Historical Provider, MD  fish oil-omega-3 fatty acids 1000 MG capsule Take 1 g by mouth 2 (two) times daily.     Historical Provider, MD  furosemide (LASIX) 20 MG tablet Take 10mg  by mouth every day 03/24/16   Jackolyn Confer, MD  glimepiride (AMARYL) 4 MG tablet TAKE 1 TABLET EVERY MORNING  AND TAKE 1/2 TABLET EVERY EVENING 03/24/16   Jackolyn Confer, MD  glucose blood (ACCU-CHEK  AVIVA PLUS) test strip Use as instructed to check blood glucose three times a day. Please dispense One touch strips. E11.65 03/29/16   Jackolyn Confer, MD  glucose blood (ONE TOUCH ULTRA TEST) test strip Use as instructed to check Blood sugar three times a day.  E11.65 Dispense patient choice. 03/29/16   Jackolyn Confer, MD  Insulin Glargine (LANTUS SOLOSTAR) 100 UNIT/ML Solostar Pen INJECT  15 UNITS SUBCUTANEOUSLY IN THE MORNING 03/24/16   Jackolyn Confer, MD  levothyroxine (SYNTHROID, LEVOTHROID) 75 MCG tablet Take 1 tablet (75 mcg total) by mouth daily. 03/24/16   Jackolyn Confer, MD  losartan (COZAAR) 50 MG tablet Take 1 tablet (50 mg total) by mouth daily. 03/24/16   Jackolyn Confer, MD  metFORMIN (GLUCOPHAGE) 1000 MG tablet Take 1 tablet (1,000 mg total) by mouth 2 (two) times daily with a meal. TAKE 1 TABLET TWICE DAILY  WITH  A  MEAL 03/24/16   Jackolyn Confer, MD  metoprolol succinate (TOPROL-XL) 100 MG 24 hr tablet Take 1 tablet (100 mg total) by mouth daily. 03/24/16   Jackolyn Confer, MD  Multiple Vitamins-Minerals (MULTIVITAMIN WITH MINERALS) tablet Take 1 tablet by mouth daily.    Historical Provider, MD  omeprazole (PRILOSEC) 20 MG capsule Take 1 capsule (20 mg total) by mouth daily. 03/24/16   Jackolyn Confer, MD  simvastatin (ZOCOR) 20 MG tablet Take 1 tablet (20 mg total) by mouth every evening. 03/24/16   Jackolyn Confer, MD   BP 153/65 mmHg  Pulse 67  Temp(Src) 97.5 F (36.4 C) (Oral)  Resp 18  Wt 90.266 kg  SpO2 97% Physical Exam  Constitutional: She is oriented to person, place, and time. She appears well-developed and well-nourished. No distress.  HENT:  Head: Normocephalic and atraumatic.  Mouth/Throat: Oropharynx is clear and moist.  Right posterior parieto-occipital hematoma, 4 cm in diameter with mild tenderness to palpation. No skin abrasions or active bleeding.   Eyes: EOM are normal. Pupils are equal, round, and reactive to light. Right eye exhibits no  discharge. Left eye exhibits no discharge.  Neck: Normal range of motion. Neck supple.  Cardiovascular: Normal rate, regular rhythm and intact distal pulses.   Pulmonary/Chest: Effort normal and breath sounds normal. No respiratory distress. She exhibits no tenderness.  Abdominal: Soft. She exhibits no distension. There is no tenderness. There is no rebound.  Musculoskeletal: Normal range of motion. She exhibits no edema.  Patient has no tenderness to palpation along the cervical thoracic or lumbar spine. She has full range of motion of the cervical thoracic and lumbar spine. She has no pain with active or passive range of motion of the shoulder or elbow wrist and digits. No pain with active or passive range of motion of the hips knees and ankle bilaterally. She is nontender to palpation throughout the extremities. No skin breakdown noted.  Neurological: She is alert and oriented to  person, place, and time. She has normal reflexes. She displays normal reflexes. No cranial nerve deficit. Coordination normal.  Skin: Skin is warm and dry.  Psychiatric: She has a normal mood and affect. Her behavior is normal. Thought content normal.    ED Course  Procedures (including critical care time) Labs Review Labs Reviewed - No data to display  Imaging Review Ct Head Wo Contrast  03/30/2016  CLINICAL DATA:  Struck head on table after fall. EXAM: CT HEAD WITHOUT CONTRAST TECHNIQUE: Contiguous axial images were obtained from the base of the skull through the vertex without intravenous contrast. COMPARISON:  09/20/2015 FINDINGS: The brainstem, cerebellum, cerebral peduncles, thalami, basal ganglia, basilar cisterns, and ventricular system appear within normal limits. Mild periventricular white matter and corona radiata hypodensities favor chronic ischemic microvascular white matter disease. No intracranial hemorrhage, mass lesion, or acute CVA. Right parietal scalp hematoma near the vertex. IMPRESSION: 1. Right  parietal scalp hematoma near the vertex. No acute intracranial findings. 2. Mild chronic ischemic microvascular white matter disease. Electronically Signed   By: Van Clines M.D.   On: 03/30/2016 18:27   I have personally reviewed and evaluated these images and lab results as part of my medical decision-making.   EKG Interpretation None      MDM   Final diagnoses:  Scalp hematoma, initial encounter  Fall, initial encounter  Head trauma, initial encounter    82 year old female with fall earlier this morning, fall secondary to loss of balance, no chest pain palpitations or dizziness. After fall no loss of consciousness or headache. She has no neurological deficits and negative head CT. Patient's ambulatory with no assisted devices. She is educated on signs and symptoms to return to the emergency department for.    Duanne Guess, PA-C 03/30/16 [place], MD 03/30/16 2231

## 2016-03-30 NOTE — ED Notes (Signed)

## 2016-04-03 ENCOUNTER — Telehealth: Payer: Self-pay | Admitting: *Deleted

## 2016-04-03 NOTE — Telephone Encounter (Signed)
Diane Mullins, can you look into this, Thanks

## 2016-04-03 NOTE — Telephone Encounter (Signed)
Looks like referral went to Dr. Manuella Ghazi, please advise?

## 2016-04-03 NOTE — Telephone Encounter (Signed)
Patients daughter called, in reference to pt neurologist appt. Pt daughter stated that she could not be seen until October, however she will need to be seen sooner. Pt  Daughter contact [name]   [phone]

## 2016-04-03 NOTE — Telephone Encounter (Signed)
Will need to talk with Micheline Maze when she returns to see if we can expedite referral or refer to another provider.

## 2016-04-04 ENCOUNTER — Ambulatory Visit: Payer: PPO | Admitting: Internal Medicine

## 2016-05-04 DIAGNOSIS — E538 Deficiency of other specified B group vitamins: Secondary | ICD-10-CM | POA: Diagnosis not present

## 2016-05-04 DIAGNOSIS — R413 Other amnesia: Secondary | ICD-10-CM | POA: Diagnosis not present

## 2016-05-04 DIAGNOSIS — E559 Vitamin D deficiency, unspecified: Secondary | ICD-10-CM | POA: Diagnosis not present

## 2016-05-04 DIAGNOSIS — I69328 Other speech and language deficits following cerebral infarction: Secondary | ICD-10-CM | POA: Diagnosis not present

## 2016-05-16 DIAGNOSIS — E538 Deficiency of other specified B group vitamins: Secondary | ICD-10-CM | POA: Diagnosis not present

## 2016-05-23 DIAGNOSIS — E538 Deficiency of other specified B group vitamins: Secondary | ICD-10-CM | POA: Diagnosis not present

## 2016-05-30 DIAGNOSIS — E538 Deficiency of other specified B group vitamins: Secondary | ICD-10-CM | POA: Diagnosis not present

## 2016-06-06 DIAGNOSIS — H2513 Age-related nuclear cataract, bilateral: Secondary | ICD-10-CM | POA: Diagnosis not present

## 2016-06-06 DIAGNOSIS — E538 Deficiency of other specified B group vitamins: Secondary | ICD-10-CM | POA: Diagnosis not present

## 2016-06-06 LAB — HM DIABETES EYE EXAM

## 2016-06-14 ENCOUNTER — Encounter: Payer: Self-pay | Admitting: Surgical

## 2016-06-26 ENCOUNTER — Ambulatory Visit: Payer: PPO | Admitting: Family Medicine

## 2016-06-30 DIAGNOSIS — Z1231 Encounter for screening mammogram for malignant neoplasm of breast: Secondary | ICD-10-CM | POA: Diagnosis not present

## 2016-06-30 LAB — HM MAMMOGRAPHY

## 2016-07-01 ENCOUNTER — Other Ambulatory Visit: Payer: Self-pay | Admitting: Family Medicine

## 2016-07-04 DIAGNOSIS — E538 Deficiency of other specified B group vitamins: Secondary | ICD-10-CM | POA: Diagnosis not present

## 2016-07-18 ENCOUNTER — Ambulatory Visit (INDEPENDENT_AMBULATORY_CARE_PROVIDER_SITE_OTHER): Payer: PPO | Admitting: Family Medicine

## 2016-07-18 ENCOUNTER — Encounter: Payer: Self-pay | Admitting: Family Medicine

## 2016-07-18 DIAGNOSIS — J209 Acute bronchitis, unspecified: Secondary | ICD-10-CM | POA: Diagnosis not present

## 2016-07-18 MED ORDER — AZITHROMYCIN 250 MG PO TABS: ORAL_TABLET | ORAL | 0 refills | Status: DC

## 2016-07-18 MED ORDER — ALBUTEROL SULFATE (2.5 MG/3ML) 0.083% IN NEBU
2.5000 mg | INHALATION_SOLUTION | Freq: Once | RESPIRATORY_TRACT | Status: AC
  Administered 2016-07-18: 2.5 mg via RESPIRATORY_TRACT

## 2016-07-18 MED ORDER — ALBUTEROL SULFATE HFA 108 (90 BASE) MCG/ACT IN AERS: 2.0000 | INHALATION_SPRAY | Freq: Four times a day (QID) | RESPIRATORY_TRACT | 0 refills | Status: DC | PRN

## 2016-07-18 NOTE — Assessment & Plan Note (Signed)
Patient with symptoms most consistent with acute bronchitis. Wheezing resolved with albuterol nebulizer in the office. Given persistent symptoms we will treat with azithromycin and provide her with an albuterol inhaler. She is given return precautions.

## 2016-07-18 NOTE — Progress Notes (Signed)
  Tommi Rumps, MD Phone: [phone]  Diane Mullins is a 82 y.o. female who presents today for same-day visit.  Patient notes starting 5 days ago of sore throat and cough. Notes the sore throat has resolved though she has developed increasing chest congestion. She notes no upper respiratory or nasal congestion. Cough has been bothersome as well. Cough is nonproductive. She has been wheezing some. No fevers though she did have some chills last week. Vomited once last week. No shortness of breath. Has a history of bronchitis in the past requiring a Z-Pak.  PMH: nonsmoker.   ROS see history of present illness  Objective  Physical Exam Vitals:   07/18/16 1020  BP: 140/82  Pulse: 81  Temp: 97.9 F (36.6 C)    BP Readings from Last 3 Encounters:  07/18/16 140/82  03/30/16 (!) 176/83  03/24/16 120/68   Wt Readings from Last 3 Encounters:  07/18/16 206 lb (93.4 kg)  03/30/16 199 lb (90.3 kg)  03/24/16 199 lb 9.6 oz (90.5 kg)    Physical Exam  Constitutional: No distress.  HENT:  Head: Normocephalic and atraumatic.  Mouth/Throat: Oropharynx is clear and moist. No oropharyngeal exudate.  Eyes: Conjunctivae are normal. Pupils are equal, round, and reactive to light.  Cardiovascular: Normal rate, regular rhythm and normal heart sounds.   Pulmonary/Chest: Effort normal. No respiratory distress. She has wheezes (scattered expiratory wheezes). She has no rales.  Neurological: She is alert. Gait normal.  Skin: Skin is warm and dry. She is not diaphoretic.   Given an albuterol nebulizer in the office and she had improvement in her symptoms. Wheezing resolved with this. No focal findings on exam.  Assessment/Plan: Please see individual problem list.  Acute bronchitis Patient with symptoms most consistent with acute bronchitis. Wheezing resolved with albuterol nebulizer in the office. Given persistent symptoms we will treat with azithromycin and provide her with an albuterol  inhaler. She is given return precautions.   No orders of the defined types were placed in this encounter.   Meds ordered this encounter  Medications  . azithromycin (ZITHROMAX) 250 MG tablet    Sig: Please take 500 mg (2 tablets) by mouth today, then take 250 mg (one tablet) by mouth daily for 4 days    Dispense:  6 tablet    Refill:  0  . albuterol (PROVENTIL HFA;VENTOLIN HFA) 108 (90 Base) MCG/ACT inhaler    Sig: Inhale 2 puffs into the lungs every 6 (six) hours as needed for wheezing or shortness of breath.    Dispense:  1 Inhaler    Refill:  0    Tommi Rumps, MD [place]

## 2016-07-18 NOTE — Patient Instructions (Signed)
Nice to see you. We are going to treat you for bronchitis with azithromycin. You should use the albuterol inhaler if you feel that you're wheezing or having trouble breathing. If you develop shortness of breath, cough productive of blood, fevers, or any new or changing symptoms please seek medical attention immediately.

## 2016-07-25 ENCOUNTER — Encounter: Payer: Self-pay | Admitting: Family Medicine

## 2016-07-25 ENCOUNTER — Ambulatory Visit (INDEPENDENT_AMBULATORY_CARE_PROVIDER_SITE_OTHER): Payer: PPO | Admitting: Family Medicine

## 2016-07-25 VITALS — BP 138/72 | HR 85 | Temp 97.8°F | Wt 208.4 lb

## 2016-07-25 DIAGNOSIS — E119 Type 2 diabetes mellitus without complications: Secondary | ICD-10-CM

## 2016-07-25 DIAGNOSIS — Z794 Long term (current) use of insulin: Secondary | ICD-10-CM | POA: Diagnosis not present

## 2016-07-25 DIAGNOSIS — E785 Hyperlipidemia, unspecified: Secondary | ICD-10-CM

## 2016-07-25 DIAGNOSIS — I10 Essential (primary) hypertension: Secondary | ICD-10-CM | POA: Diagnosis not present

## 2016-07-25 DIAGNOSIS — J209 Acute bronchitis, unspecified: Secondary | ICD-10-CM

## 2016-07-25 LAB — COMPREHENSIVE METABOLIC PANEL
ALK PHOS: 95 U/L (ref 39–117)
ALT: 13 U/L (ref 0–35)
AST: 15 U/L (ref 0–37)
Albumin: 4 g/dL (ref 3.5–5.2)
BUN: 16 mg/dL (ref 6–23)
CO2: 29 meq/L (ref 19–32)
Calcium: 9.8 mg/dL (ref 8.4–10.5)
Chloride: 101 mEq/L (ref 96–112)
Creatinine, Ser: 1 mg/dL (ref 0.40–1.20)
GFR: 56.44 mL/min — AB (ref >60.00)
GLUCOSE: 199 mg/dL — AB (ref 70–99)
POTASSIUM: 4.8 meq/L (ref 3.5–5.1)
SODIUM: 138 meq/L (ref 135–145)
TOTAL PROTEIN: 7.1 g/dL (ref 6.0–8.3)
Total Bilirubin: 0.3 mg/dL (ref 0.2–1.2)

## 2016-07-25 LAB — HEMOGLOBIN A1C: HEMOGLOBIN A1C: 8.3 % — AB (ref 4.6–6.5)

## 2016-07-25 NOTE — Assessment & Plan Note (Signed)
Continue simvastatin. Check CMP.

## 2016-07-25 NOTE — Progress Notes (Signed)
  Tommi Rumps, MD Phone: [phone]  Diane Mullins is a 82 y.o. female who presents today for f/u.  HYPERTENSION Disease Monitoring: Blood pressure range-not checking Chest pain- no      Dyspnea- no Medications: Compliance- taking metoprolol, losartan   Edema- rare, resolves with propping her feet up and at night. No orthopnea or PND.  DIABETES Disease Monitoring: Blood Sugar ranges-80-102 Polyuria/phagia/dipsia- no      Optho- saw 06/06/16 Medications: Compliance- taking lantus 15 u, glimeperide, metformin Hypoglycemic symptoms- 1-2 times a month in the middle the night, eats cookies and feels better  HYPERLIPIDEMIA Disease Monitoring: See symptoms for Hypertension Medications: Compliance- taking simvastatin Right upper quadrant pain- no  Muscle aches- no  Patient reports her bronchitis is resolved.   PMH: nonsmoker.   ROS see history of present illness  Objective  Physical Exam Vitals:   07/25/16 1123  BP: 138/72  Pulse: 85  Temp: 97.8 F (36.6 C)    BP Readings from Last 3 Encounters:  07/25/16 138/72  07/18/16 140/82  03/30/16 (!) 176/83   Wt Readings from Last 3 Encounters:  07/25/16 208 lb 6.4 oz (94.5 kg)  07/18/16 206 lb (93.4 kg)  03/30/16 199 lb (90.3 kg)    Physical Exam  Constitutional: She is well-developed, well-nourished, and in no distress.  Cardiovascular: Normal rate, regular rhythm and normal heart sounds.   Pulmonary/Chest: Effort normal and breath sounds normal.  Musculoskeletal: She exhibits no edema.  Neurological: She is alert. Gait normal.  Skin: Skin is warm and dry.   Diabetic Foot Exam - Simple   Simple Foot Form Diabetic Foot exam was performed with the following findings:  Yes 07/25/2016 11:43 AM  Visual Inspection No deformities, no ulcerations, no other skin breakdown bilaterally:  Yes Sensation Testing Intact to touch and monofilament testing bilaterally:  Yes Pulse Check Posterior Tibialis and Dorsalis pulse  intact bilaterally:  Yes Comments     Assessment/Plan: Please see individual problem list.  Hypertension At goal. Continue current medications. Check CMP today. Suspect swelling potentially related to venous insufficiency. Recent TSH in the normal range. Check CMP. No CHF symptoms.  Diabetes ([place]) Well-controlled. Check A1c. Continue current medication.  Hyperlipidemia Continue simvastatin. Check CMP.  Acute bronchitis Improved. Benign lung exam.   Orders Placed This Encounter  Procedures  . HgB A1c  . Comp Met (CMET)    Tommi Rumps, MD [place]

## 2016-07-25 NOTE — Progress Notes (Signed)
Pre visit review using our clinic review tool, if applicable. No additional management support is needed unless otherwise documented below in the visit note. 

## 2016-07-25 NOTE — Patient Instructions (Signed)
Nice to see you. We will check some lab work and call you with the results. Please continue your diabetes medicines, blood pressure medicines, and cholesterol medicine.

## 2016-07-25 NOTE — Assessment & Plan Note (Signed)
Well-controlled. Check A1c. Continue current medication.

## 2016-07-25 NOTE — Assessment & Plan Note (Addendum)
At goal. Continue current medications. Check CMP today. Suspect swelling potentially related to venous insufficiency. Recent TSH in the normal range. Check CMP. No CHF symptoms.

## 2016-07-25 NOTE — Assessment & Plan Note (Signed)
Improved. Benign lung exam.

## 2016-07-27 ENCOUNTER — Telehealth: Payer: Self-pay | Admitting: *Deleted

## 2016-07-27 NOTE — Telephone Encounter (Signed)
Patient requested lab results Pt contact [phone]

## 2016-07-31 NOTE — Telephone Encounter (Signed)
LM for patient to return call for lab results.  

## 2016-08-01 NOTE — Telephone Encounter (Signed)
LM for patient to return call for lab results.  

## 2016-08-08 ENCOUNTER — Encounter: Payer: Self-pay | Admitting: Surgical

## 2016-08-17 DIAGNOSIS — E538 Deficiency of other specified B group vitamins: Secondary | ICD-10-CM | POA: Diagnosis not present

## 2016-08-18 ENCOUNTER — Ambulatory Visit: Payer: Commercial Managed Care - HMO

## 2016-08-22 ENCOUNTER — Telehealth: Payer: Self-pay | Admitting: Family Medicine

## 2016-08-22 DIAGNOSIS — F4322 Adjustment disorder with anxiety: Secondary | ICD-10-CM

## 2016-08-22 MED ORDER — INSULIN GLARGINE 100 UNIT/ML SOLOSTAR PEN: PEN_INJECTOR | SUBCUTANEOUS | 6 refills | Status: DC

## 2016-08-22 NOTE — Telephone Encounter (Signed)
Lantus will be refilled. Please find out who has been prescribing her Xanax previously and why she takes this. It does not appear that it was coming from Dr. Gilford Rile per review of her chart. Thanks.

## 2016-08-22 NOTE — Telephone Encounter (Signed)
Please advise on refill.

## 2016-08-22 NOTE — Telephone Encounter (Signed)
Pt called and is requesting a refill on ALPRAZolam (XANAX) 0.25 MG tablet and she needs needles for her Lantus pen. Please advise, thank you!  [place], [place]  Call pt @ [phone]

## 2016-08-23 MED ORDER — ALPRAZOLAM 0.25 MG PO TABS: 0.2500 mg | ORAL_TABLET | Freq: Three times a day (TID) | ORAL | 1 refills | Status: DC | PRN

## 2016-08-23 NOTE — Telephone Encounter (Signed)
Spoke with patient and she stated that Dr. Gilford Rile has been prescribing the Xanax. She only takes it PRN for anxiety and sleep. Please advise

## 2016-08-23 NOTE — Telephone Encounter (Signed)
Noted. Medications reviewed and it does appear that Dr. Gilford Rile had been prescribing it. Refill will be provided.

## 2016-08-24 NOTE — Telephone Encounter (Signed)
RX faxed

## 2016-08-25 ENCOUNTER — Telehealth: Payer: Self-pay | Admitting: Family Medicine

## 2016-08-25 NOTE — Telephone Encounter (Signed)
Ysidro Evert called from [facility] [phone] regarding pt ALPRAZolam (XANAX) 0.25 MG tablet. He wants to know how long pt has to take it for and last how many days? Please advise? VM can be left please leave your first and last name. Thank you!

## 2016-08-25 NOTE — Telephone Encounter (Signed)
Spoke with pharmacy and it is a 20 day supply for insurance purposes.

## 2016-08-29 ENCOUNTER — Ambulatory Visit: Payer: PPO

## 2016-10-03 ENCOUNTER — Ambulatory Visit: Payer: PPO

## 2016-10-16 DIAGNOSIS — R918 Other nonspecific abnormal finding of lung field: Secondary | ICD-10-CM | POA: Diagnosis not present

## 2016-10-16 DIAGNOSIS — S82432A Displaced oblique fracture of shaft of left fibula, initial encounter for closed fracture: Secondary | ICD-10-CM | POA: Diagnosis not present

## 2016-10-16 DIAGNOSIS — E039 Hypothyroidism, unspecified: Secondary | ICD-10-CM | POA: Diagnosis not present

## 2016-10-16 DIAGNOSIS — Y92009 Unspecified place in unspecified non-institutional (private) residence as the place of occurrence of the external cause: Secondary | ICD-10-CM | POA: Diagnosis not present

## 2016-10-16 DIAGNOSIS — R112 Nausea with vomiting, unspecified: Secondary | ICD-10-CM | POA: Diagnosis not present

## 2016-10-16 DIAGNOSIS — S82435A Nondisplaced oblique fracture of shaft of left fibula, initial encounter for closed fracture: Secondary | ICD-10-CM | POA: Diagnosis not present

## 2016-10-16 DIAGNOSIS — E119 Type 2 diabetes mellitus without complications: Secondary | ICD-10-CM | POA: Diagnosis not present

## 2016-10-16 DIAGNOSIS — E876 Hypokalemia: Secondary | ICD-10-CM | POA: Diagnosis not present

## 2016-10-16 DIAGNOSIS — M6281 Muscle weakness (generalized): Secondary | ICD-10-CM | POA: Diagnosis not present

## 2016-10-16 DIAGNOSIS — R404 Transient alteration of awareness: Secondary | ICD-10-CM | POA: Diagnosis not present

## 2016-10-16 DIAGNOSIS — R05 Cough: Secondary | ICD-10-CM | POA: Diagnosis not present

## 2016-10-16 DIAGNOSIS — S8265XA Nondisplaced fracture of lateral malleolus of left fibula, initial encounter for closed fracture: Secondary | ICD-10-CM | POA: Diagnosis not present

## 2016-10-16 DIAGNOSIS — S82832A Other fracture of upper and lower end of left fibula, initial encounter for closed fracture: Secondary | ICD-10-CM | POA: Diagnosis not present

## 2016-10-16 DIAGNOSIS — J811 Chronic pulmonary edema: Secondary | ICD-10-CM | POA: Diagnosis not present

## 2016-10-16 DIAGNOSIS — R2689 Other abnormalities of gait and mobility: Secondary | ICD-10-CM | POA: Diagnosis not present

## 2016-10-16 DIAGNOSIS — E785 Hyperlipidemia, unspecified: Secondary | ICD-10-CM | POA: Diagnosis not present

## 2016-10-16 DIAGNOSIS — I10 Essential (primary) hypertension: Secondary | ICD-10-CM | POA: Diagnosis not present

## 2016-10-16 DIAGNOSIS — R0902 Hypoxemia: Secondary | ICD-10-CM | POA: Diagnosis not present

## 2016-10-16 DIAGNOSIS — M7989 Other specified soft tissue disorders: Secondary | ICD-10-CM | POA: Diagnosis not present

## 2016-10-16 DIAGNOSIS — R4701 Aphasia: Secondary | ICD-10-CM | POA: Diagnosis not present

## 2016-10-16 DIAGNOSIS — R531 Weakness: Secondary | ICD-10-CM | POA: Diagnosis not present

## 2016-10-16 DIAGNOSIS — J09X2 Influenza due to identified novel influenza A virus with other respiratory manifestations: Secondary | ICD-10-CM | POA: Diagnosis not present

## 2016-10-16 DIAGNOSIS — R2681 Unsteadiness on feet: Secondary | ICD-10-CM | POA: Diagnosis not present

## 2016-10-16 DIAGNOSIS — Z794 Long term (current) use of insulin: Secondary | ICD-10-CM | POA: Diagnosis not present

## 2016-10-16 DIAGNOSIS — S82435D Nondisplaced oblique fracture of shaft of left fibula, subsequent encounter for closed fracture with routine healing: Secondary | ICD-10-CM | POA: Diagnosis not present

## 2016-10-16 DIAGNOSIS — Z9181 History of falling: Secondary | ICD-10-CM | POA: Diagnosis not present

## 2016-10-16 DIAGNOSIS — Z79899 Other long term (current) drug therapy: Secondary | ICD-10-CM | POA: Diagnosis not present

## 2016-10-16 DIAGNOSIS — W010XXA Fall on same level from slipping, tripping and stumbling without subsequent striking against object, initial encounter: Secondary | ICD-10-CM | POA: Diagnosis not present

## 2016-10-16 DIAGNOSIS — J101 Influenza due to other identified influenza virus with other respiratory manifestations: Secondary | ICD-10-CM | POA: Diagnosis not present

## 2016-10-17 ENCOUNTER — Ambulatory Visit: Payer: PPO

## 2016-10-26 DIAGNOSIS — K5900 Constipation, unspecified: Secondary | ICD-10-CM | POA: Diagnosis not present

## 2016-10-26 DIAGNOSIS — S82435A Nondisplaced oblique fracture of shaft of left fibula, initial encounter for closed fracture: Secondary | ICD-10-CM | POA: Diagnosis not present

## 2016-10-26 DIAGNOSIS — R112 Nausea with vomiting, unspecified: Secondary | ICD-10-CM | POA: Diagnosis not present

## 2016-10-26 DIAGNOSIS — E785 Hyperlipidemia, unspecified: Secondary | ICD-10-CM | POA: Diagnosis not present

## 2016-10-26 DIAGNOSIS — E119 Type 2 diabetes mellitus without complications: Secondary | ICD-10-CM | POA: Diagnosis not present

## 2016-10-26 DIAGNOSIS — E039 Hypothyroidism, unspecified: Secondary | ICD-10-CM | POA: Diagnosis not present

## 2016-10-26 DIAGNOSIS — S8265XA Nondisplaced fracture of lateral malleolus of left fibula, initial encounter for closed fracture: Secondary | ICD-10-CM | POA: Diagnosis not present

## 2016-10-26 DIAGNOSIS — J101 Influenza due to other identified influenza virus with other respiratory manifestations: Secondary | ICD-10-CM | POA: Diagnosis not present

## 2016-10-26 DIAGNOSIS — R2689 Other abnormalities of gait and mobility: Secondary | ICD-10-CM | POA: Diagnosis not present

## 2016-10-26 DIAGNOSIS — Z9181 History of falling: Secondary | ICD-10-CM | POA: Diagnosis not present

## 2016-10-26 DIAGNOSIS — F39 Unspecified mood [affective] disorder: Secondary | ICD-10-CM | POA: Diagnosis not present

## 2016-10-26 DIAGNOSIS — R2681 Unsteadiness on feet: Secondary | ICD-10-CM | POA: Diagnosis not present

## 2016-10-26 DIAGNOSIS — I10 Essential (primary) hypertension: Secondary | ICD-10-CM | POA: Diagnosis not present

## 2016-10-26 DIAGNOSIS — R110 Nausea: Secondary | ICD-10-CM | POA: Diagnosis not present

## 2016-10-26 DIAGNOSIS — M6281 Muscle weakness (generalized): Secondary | ICD-10-CM | POA: Diagnosis not present

## 2016-10-26 DIAGNOSIS — S82832A Other fracture of upper and lower end of left fibula, initial encounter for closed fracture: Secondary | ICD-10-CM | POA: Diagnosis not present

## 2016-10-26 DIAGNOSIS — S82435D Nondisplaced oblique fracture of shaft of left fibula, subsequent encounter for closed fracture with routine healing: Secondary | ICD-10-CM | POA: Diagnosis not present

## 2016-10-30 DIAGNOSIS — K5900 Constipation, unspecified: Secondary | ICD-10-CM | POA: Diagnosis not present

## 2016-10-30 DIAGNOSIS — R112 Nausea with vomiting, unspecified: Secondary | ICD-10-CM | POA: Diagnosis not present

## 2016-10-31 ENCOUNTER — Ambulatory Visit: Payer: PPO | Admitting: Family Medicine

## 2016-10-31 DIAGNOSIS — F39 Unspecified mood [affective] disorder: Secondary | ICD-10-CM

## 2016-10-31 DIAGNOSIS — I10 Essential (primary) hypertension: Secondary | ICD-10-CM | POA: Diagnosis not present

## 2016-10-31 DIAGNOSIS — E119 Type 2 diabetes mellitus without complications: Secondary | ICD-10-CM | POA: Diagnosis not present

## 2016-10-31 DIAGNOSIS — S82832A Other fracture of upper and lower end of left fibula, initial encounter for closed fracture: Secondary | ICD-10-CM | POA: Diagnosis not present

## 2016-10-31 DIAGNOSIS — R110 Nausea: Secondary | ICD-10-CM | POA: Diagnosis not present

## 2016-11-06 DIAGNOSIS — S8265XA Nondisplaced fracture of lateral malleolus of left fibula, initial encounter for closed fracture: Secondary | ICD-10-CM | POA: Diagnosis not present

## 2016-11-07 ENCOUNTER — Ambulatory Visit: Payer: PPO

## 2016-11-16 ENCOUNTER — Telehealth: Payer: Self-pay | Admitting: Family Medicine

## 2016-11-16 NOTE — Telephone Encounter (Signed)
Keesa from [facility] called and stated that there is a delay in service. They will be out to see the patient on Wednesday 3/7. Thank you!   Call Keesa@ [phone]

## 2016-11-16 NOTE — Telephone Encounter (Signed)
fyi

## 2016-11-16 NOTE — Telephone Encounter (Signed)
Noted  

## 2016-11-20 NOTE — Telephone Encounter (Signed)
Diane Mullins [phone] called from [facility] stating that PT can go out to pt on 11/21/2016 instead of 11/22/2016. Thank you!

## 2016-11-20 NOTE — Telephone Encounter (Signed)
fyi

## 2016-11-21 DIAGNOSIS — Z9181 History of falling: Secondary | ICD-10-CM | POA: Diagnosis not present

## 2016-11-21 DIAGNOSIS — E785 Hyperlipidemia, unspecified: Secondary | ICD-10-CM | POA: Diagnosis not present

## 2016-11-21 DIAGNOSIS — E039 Hypothyroidism, unspecified: Secondary | ICD-10-CM | POA: Diagnosis not present

## 2016-11-21 DIAGNOSIS — Z7984 Long term (current) use of oral hypoglycemic drugs: Secondary | ICD-10-CM | POA: Diagnosis not present

## 2016-11-21 DIAGNOSIS — W06XXXD Fall from bed, subsequent encounter: Secondary | ICD-10-CM | POA: Diagnosis not present

## 2016-11-21 DIAGNOSIS — E119 Type 2 diabetes mellitus without complications: Secondary | ICD-10-CM | POA: Diagnosis not present

## 2016-11-21 DIAGNOSIS — Z7982 Long term (current) use of aspirin: Secondary | ICD-10-CM | POA: Diagnosis not present

## 2016-11-21 DIAGNOSIS — F39 Unspecified mood [affective] disorder: Secondary | ICD-10-CM | POA: Diagnosis not present

## 2016-11-21 DIAGNOSIS — Z8673 Personal history of transient ischemic attack (TIA), and cerebral infarction without residual deficits: Secondary | ICD-10-CM | POA: Diagnosis not present

## 2016-11-21 DIAGNOSIS — K219 Gastro-esophageal reflux disease without esophagitis: Secondary | ICD-10-CM | POA: Diagnosis not present

## 2016-11-21 DIAGNOSIS — I10 Essential (primary) hypertension: Secondary | ICD-10-CM | POA: Diagnosis not present

## 2016-11-21 DIAGNOSIS — S82435D Nondisplaced oblique fracture of shaft of left fibula, subsequent encounter for closed fracture with routine healing: Secondary | ICD-10-CM | POA: Diagnosis not present

## 2016-11-22 ENCOUNTER — Telehealth: Payer: Self-pay | Admitting: Family Medicine

## 2016-11-22 NOTE — Telephone Encounter (Signed)
Please advise 

## 2016-11-22 NOTE — Telephone Encounter (Signed)
Order given

## 2016-11-22 NOTE — Telephone Encounter (Signed)
[place] [phone] called from [facility] needing a verbal order for PT twice a week for four weeks. Thank you!

## 2016-11-22 NOTE — Telephone Encounter (Signed)
Ok to give verbal for PT.

## 2016-11-23 DIAGNOSIS — Z9181 History of falling: Secondary | ICD-10-CM | POA: Diagnosis not present

## 2016-11-23 DIAGNOSIS — E785 Hyperlipidemia, unspecified: Secondary | ICD-10-CM | POA: Diagnosis not present

## 2016-11-23 DIAGNOSIS — E119 Type 2 diabetes mellitus without complications: Secondary | ICD-10-CM | POA: Diagnosis not present

## 2016-11-23 DIAGNOSIS — Z7982 Long term (current) use of aspirin: Secondary | ICD-10-CM | POA: Diagnosis not present

## 2016-11-23 DIAGNOSIS — Z8673 Personal history of transient ischemic attack (TIA), and cerebral infarction without residual deficits: Secondary | ICD-10-CM | POA: Diagnosis not present

## 2016-11-23 DIAGNOSIS — W06XXXD Fall from bed, subsequent encounter: Secondary | ICD-10-CM | POA: Diagnosis not present

## 2016-11-23 DIAGNOSIS — I10 Essential (primary) hypertension: Secondary | ICD-10-CM | POA: Diagnosis not present

## 2016-11-23 DIAGNOSIS — Z7984 Long term (current) use of oral hypoglycemic drugs: Secondary | ICD-10-CM | POA: Diagnosis not present

## 2016-11-23 DIAGNOSIS — E039 Hypothyroidism, unspecified: Secondary | ICD-10-CM | POA: Diagnosis not present

## 2016-11-23 DIAGNOSIS — F39 Unspecified mood [affective] disorder: Secondary | ICD-10-CM | POA: Diagnosis not present

## 2016-11-23 DIAGNOSIS — K219 Gastro-esophageal reflux disease without esophagitis: Secondary | ICD-10-CM | POA: Diagnosis not present

## 2016-11-23 DIAGNOSIS — S82435D Nondisplaced oblique fracture of shaft of left fibula, subsequent encounter for closed fracture with routine healing: Secondary | ICD-10-CM | POA: Diagnosis not present

## 2016-11-24 ENCOUNTER — Other Ambulatory Visit: Payer: Self-pay | Admitting: *Deleted

## 2016-11-24 MED ORDER — FUROSEMIDE 20 MG PO TABS
ORAL_TABLET | ORAL | 1 refills | Status: DC
Start: 2016-11-24 — End: 2016-12-19

## 2016-11-24 NOTE — Telephone Encounter (Signed)
Last OV 07/25/16 last filled 03/24/16 by Dr.Walker 35 1rf Patient states she had not been taking the amlodipine since it hasn't been filled since 2014 but her daughter who is a nurse told her to start taking it again since it was prescribed so patient started back on this.

## 2016-11-24 NOTE — Telephone Encounter (Signed)
Lasix refilled. Patient does not need the amlodipine as her blood pressure has been under good control. We'll discuss further at her next visit. CMA reports patient is out of the amlodpine.

## 2016-11-24 NOTE — Telephone Encounter (Signed)
Requested medication refill for : Lasix,amlodopine  Pharmacy:[facility]  Please Contact Pt when ready or sent to Pharmacy:  [phone]

## 2016-11-28 DIAGNOSIS — W06XXXD Fall from bed, subsequent encounter: Secondary | ICD-10-CM | POA: Diagnosis not present

## 2016-11-28 DIAGNOSIS — S82435D Nondisplaced oblique fracture of shaft of left fibula, subsequent encounter for closed fracture with routine healing: Secondary | ICD-10-CM | POA: Diagnosis not present

## 2016-11-28 DIAGNOSIS — Z7982 Long term (current) use of aspirin: Secondary | ICD-10-CM | POA: Diagnosis not present

## 2016-11-28 DIAGNOSIS — K219 Gastro-esophageal reflux disease without esophagitis: Secondary | ICD-10-CM | POA: Diagnosis not present

## 2016-11-28 DIAGNOSIS — I10 Essential (primary) hypertension: Secondary | ICD-10-CM | POA: Diagnosis not present

## 2016-11-28 DIAGNOSIS — Z7984 Long term (current) use of oral hypoglycemic drugs: Secondary | ICD-10-CM | POA: Diagnosis not present

## 2016-11-28 DIAGNOSIS — F39 Unspecified mood [affective] disorder: Secondary | ICD-10-CM | POA: Diagnosis not present

## 2016-11-28 DIAGNOSIS — E039 Hypothyroidism, unspecified: Secondary | ICD-10-CM | POA: Diagnosis not present

## 2016-11-28 DIAGNOSIS — E785 Hyperlipidemia, unspecified: Secondary | ICD-10-CM | POA: Diagnosis not present

## 2016-11-28 DIAGNOSIS — Z9181 History of falling: Secondary | ICD-10-CM | POA: Diagnosis not present

## 2016-11-28 DIAGNOSIS — E119 Type 2 diabetes mellitus without complications: Secondary | ICD-10-CM | POA: Diagnosis not present

## 2016-11-28 DIAGNOSIS — Z8673 Personal history of transient ischemic attack (TIA), and cerebral infarction without residual deficits: Secondary | ICD-10-CM | POA: Diagnosis not present

## 2016-12-04 DIAGNOSIS — S8265XD Nondisplaced fracture of lateral malleolus of left fibula, subsequent encounter for closed fracture with routine healing: Secondary | ICD-10-CM | POA: Diagnosis not present

## 2016-12-05 DIAGNOSIS — I10 Essential (primary) hypertension: Secondary | ICD-10-CM | POA: Diagnosis not present

## 2016-12-05 DIAGNOSIS — Z9181 History of falling: Secondary | ICD-10-CM | POA: Diagnosis not present

## 2016-12-05 DIAGNOSIS — Z7982 Long term (current) use of aspirin: Secondary | ICD-10-CM | POA: Diagnosis not present

## 2016-12-05 DIAGNOSIS — W06XXXD Fall from bed, subsequent encounter: Secondary | ICD-10-CM | POA: Diagnosis not present

## 2016-12-05 DIAGNOSIS — E785 Hyperlipidemia, unspecified: Secondary | ICD-10-CM | POA: Diagnosis not present

## 2016-12-05 DIAGNOSIS — E119 Type 2 diabetes mellitus without complications: Secondary | ICD-10-CM | POA: Diagnosis not present

## 2016-12-05 DIAGNOSIS — Z7984 Long term (current) use of oral hypoglycemic drugs: Secondary | ICD-10-CM | POA: Diagnosis not present

## 2016-12-05 DIAGNOSIS — Z8673 Personal history of transient ischemic attack (TIA), and cerebral infarction without residual deficits: Secondary | ICD-10-CM | POA: Diagnosis not present

## 2016-12-05 DIAGNOSIS — F39 Unspecified mood [affective] disorder: Secondary | ICD-10-CM | POA: Diagnosis not present

## 2016-12-05 DIAGNOSIS — K219 Gastro-esophageal reflux disease without esophagitis: Secondary | ICD-10-CM | POA: Diagnosis not present

## 2016-12-05 DIAGNOSIS — S82435D Nondisplaced oblique fracture of shaft of left fibula, subsequent encounter for closed fracture with routine healing: Secondary | ICD-10-CM | POA: Diagnosis not present

## 2016-12-05 DIAGNOSIS — E039 Hypothyroidism, unspecified: Secondary | ICD-10-CM | POA: Diagnosis not present

## 2016-12-11 DIAGNOSIS — E785 Hyperlipidemia, unspecified: Secondary | ICD-10-CM | POA: Diagnosis not present

## 2016-12-11 DIAGNOSIS — F39 Unspecified mood [affective] disorder: Secondary | ICD-10-CM | POA: Diagnosis not present

## 2016-12-11 DIAGNOSIS — Z8673 Personal history of transient ischemic attack (TIA), and cerebral infarction without residual deficits: Secondary | ICD-10-CM | POA: Diagnosis not present

## 2016-12-11 DIAGNOSIS — E039 Hypothyroidism, unspecified: Secondary | ICD-10-CM | POA: Diagnosis not present

## 2016-12-11 DIAGNOSIS — S82435D Nondisplaced oblique fracture of shaft of left fibula, subsequent encounter for closed fracture with routine healing: Secondary | ICD-10-CM | POA: Diagnosis not present

## 2016-12-11 DIAGNOSIS — E119 Type 2 diabetes mellitus without complications: Secondary | ICD-10-CM | POA: Diagnosis not present

## 2016-12-11 DIAGNOSIS — K219 Gastro-esophageal reflux disease without esophagitis: Secondary | ICD-10-CM | POA: Diagnosis not present

## 2016-12-11 DIAGNOSIS — Z7982 Long term (current) use of aspirin: Secondary | ICD-10-CM | POA: Diagnosis not present

## 2016-12-11 DIAGNOSIS — Z7984 Long term (current) use of oral hypoglycemic drugs: Secondary | ICD-10-CM | POA: Diagnosis not present

## 2016-12-11 DIAGNOSIS — I10 Essential (primary) hypertension: Secondary | ICD-10-CM | POA: Diagnosis not present

## 2016-12-11 DIAGNOSIS — W06XXXD Fall from bed, subsequent encounter: Secondary | ICD-10-CM | POA: Diagnosis not present

## 2016-12-11 DIAGNOSIS — Z9181 History of falling: Secondary | ICD-10-CM | POA: Diagnosis not present

## 2016-12-19 ENCOUNTER — Telehealth: Payer: Self-pay | Admitting: Radiology

## 2016-12-19 ENCOUNTER — Ambulatory Visit (INDEPENDENT_AMBULATORY_CARE_PROVIDER_SITE_OTHER): Payer: PPO | Admitting: Family Medicine

## 2016-12-19 ENCOUNTER — Encounter: Payer: Self-pay | Admitting: Family Medicine

## 2016-12-19 DIAGNOSIS — I10 Essential (primary) hypertension: Secondary | ICD-10-CM | POA: Diagnosis not present

## 2016-12-19 DIAGNOSIS — J111 Influenza due to unidentified influenza virus with other respiratory manifestations: Secondary | ICD-10-CM

## 2016-12-19 DIAGNOSIS — Z794 Long term (current) use of insulin: Secondary | ICD-10-CM

## 2016-12-19 DIAGNOSIS — R351 Nocturia: Secondary | ICD-10-CM | POA: Diagnosis not present

## 2016-12-19 DIAGNOSIS — Z0000 Encounter for general adult medical examination without abnormal findings: Secondary | ICD-10-CM | POA: Diagnosis not present

## 2016-12-19 DIAGNOSIS — E119 Type 2 diabetes mellitus without complications: Secondary | ICD-10-CM | POA: Diagnosis not present

## 2016-12-19 DIAGNOSIS — E039 Hypothyroidism, unspecified: Secondary | ICD-10-CM

## 2016-12-19 MED ORDER — PEN NEEDLES 32G X 4 MM MISC: 1.0000 | Freq: Every day | 3 refills | Status: DC

## 2016-12-19 MED ORDER — FUROSEMIDE 20 MG PO TABS: ORAL_TABLET | ORAL | 1 refills | Status: DC

## 2016-12-19 NOTE — Progress Notes (Signed)
  Tommi Rumps, MD Phone: [phone]  Diane Mullins is a 83 y.o. female who presents today for f/u.  HYPERTENSION  Disease Monitoring  Home BP Monitoring not checking Chest pain- no    Dyspnea- no Medications  Compliance-  Taking metoprolol, losartan, lasix  Edema- no  DIABETES Disease Monitoring: Blood Sugar ranges-140 or lower Polyuria/phagia/dipsia- some nocturia       Medications: Compliance- taking lantus, metformin, amaryl Hypoglycemic symptoms- none recently  HYPOTHYROIDISM Disease Monitoring Weight changes: no  Skin Changes: some dry skin Heat/Cold intolerance: no  Medication Monitoring Compliance:  Taking synthroid   Last TSH:   Lab Results  Component Value Date   TSH 3.26 03/24/2016   She reports she was hospitalized with influenza in January for a period of 8 days. She went to rehabilitation at [facility] and did well. She's been doing home exercises and has been regaining her strength. She notes no new symptoms. She does note she received a fair amount of prednisone when she was in the hospital and thinks this may affect her A1c.   PMH: nonsmoker.   ROS see history of present illness  Objective  Physical Exam Vitals:   12/19/16 1506  BP: 134/68  Pulse: 73  Temp: 98.1 F (36.7 C)    BP Readings from Last 3 Encounters:  12/19/16 134/68  07/25/16 138/72  07/18/16 140/82   Wt Readings from Last 3 Encounters:  12/19/16 207 lb 12.8 oz (94.3 kg)  07/25/16 208 lb 6.4 oz (94.5 kg)  07/18/16 206 lb (93.4 kg)    Physical Exam  Constitutional: No distress.  HENT:  Head: Normocephalic and atraumatic.  Cardiovascular: Normal rate, regular rhythm and normal heart sounds.   Pulmonary/Chest: Effort normal and breath sounds normal.  Musculoskeletal: She exhibits no edema.  Neurological: She is alert.  Skin: Skin is warm and dry. She is not diaphoretic.     Assessment/Plan: Please see individual problem list.  Hypertension Well-controlled.  Continue current medications. Check lab work.  Hypothyroidism Continue Synthroid. Check TSH.  Diabetes ([place]) Seems to be well controlled. We'll check an A1c. She'll continue her current medications. She does note some nocturia and thus we will check a urinalysis as well. She notes no other urinary complaints.  Influenza Patient has recovered following hospitalization for this in January. She'll continue to do home exercises to help regain stamina.   Orders Placed This Encounter  Procedures  . Magnesium  . Comp Met (CMET)  . TSH  . HgB A1c  . POCT urinalysis dipstick    Standing Status:   Future    Standing Expiration Date:   01/18/2017    Meds ordered this encounter  Medications  . Insulin Pen Needle (PEN NEEDLES) 32G X 4 MM MISC    Sig: Inject 1 Dose into the skin daily.    Dispense:  90 each    Refill:  3  . furosemide (LASIX) 20 MG tablet    Sig: Take 8m by mouth every day    Dispense:  90 tablet    Refill:  1[place]

## 2016-12-19 NOTE — Progress Notes (Signed)
Pre visit review using our clinic review tool, if applicable. No additional management support is needed unless otherwise documented below in the visit note. 

## 2016-12-19 NOTE — Progress Notes (Signed)
I have reviewed the annual wellness visit and agree.  Tommi Rumps, M.D.

## 2016-12-19 NOTE — Assessment & Plan Note (Signed)
Seems to be well controlled. We'll check an A1c. She'll continue her current medications. She does note some nocturia and thus we will check a urinalysis as well. She notes no other urinary complaints.

## 2016-12-19 NOTE — Patient Instructions (Addendum)
Nice to see you. We will obtain some lab work today and contact her with the results.    Thank you for taking time to come for your Medicare Wellness Visit. I appreciate your ongoing commitment to your health goals. Please review the following plan we discussed and let me know if I can assist you in the future.   These are the goals we discussed: Goals    . Increase physical activity          Chair exercises.  Home physical therapy exercises.       This is a list of the screening recommended for you and due dates:  Health Maintenance  Topic Date Due  . Hemoglobin A1C  01/22/2017  . Flu Shot  04/18/2017  . Eye exam for diabetics  06/06/2017  . Complete foot exam   07/25/2017  . Tetanus Vaccine  06/18/2021  . DEXA scan (bone density measurement)  Completed  . Pneumonia vaccines  Completed    Fall Prevention in the Home Falls can cause injuries. They can happen to people of all ages. There are many things you can do to make your home safe and to help prevent falls. What can I do on the outside of my home?  Regularly fix the edges of walkways and driveways and fix any cracks.  Remove anything that might make you trip as you walk through a door, such as a raised step or threshold.  Trim any bushes or trees on the path to your home.  Use bright outdoor lighting.  Clear any walking paths of anything that might make someone trip, such as rocks or tools.  Regularly check to see if handrails are loose or broken. Make sure that both sides of any steps have handrails.  Any raised decks and porches should have guardrails on the edges.  Have any leaves, snow, or ice cleared regularly.  Use sand or salt on walking paths during winter.  Clean up any spills in your garage right away. This includes oil or grease spills. What can I do in the bathroom?  Use night lights.  Install grab bars by the toilet and in the tub and shower. Do not use towel bars as grab bars.  Use non-skid  mats or decals in the tub or shower.  If you need to sit down in the shower, use a plastic, non-slip stool.  Keep the floor dry. Clean up any water that spills on the floor as soon as it happens.  Remove soap buildup in the tub or shower regularly.  Attach bath mats securely with double-sided non-slip rug tape.  Do not have throw rugs and other things on the floor that can make you trip. What can I do in the bedroom?  Use night lights.  Make sure that you have a light by your bed that is easy to reach.  Do not use any sheets or blankets that are too big for your bed. They should not hang down onto the floor.  Have a firm chair that has side arms. You can use this for support while you get dressed.  Do not have throw rugs and other things on the floor that can make you trip. What can I do in the kitchen?  Clean up any spills right away.  Avoid walking on wet floors.  Keep items that you use a lot in easy-to-reach places.  If you need to reach something above you, use a strong step stool that has a grab  bar.  Keep electrical cords out of the way.  Do not use floor polish or wax that makes floors slippery. If you must use wax, use non-skid floor wax.  Do not have throw rugs and other things on the floor that can make you trip. What can I do with my stairs?  Do not leave any items on the stairs.  Make sure that there are handrails on both sides of the stairs and use them. Fix handrails that are broken or loose. Make sure that handrails are as long as the stairways.  Check any carpeting to make sure that it is firmly attached to the stairs. Fix any carpet that is loose or worn.  Avoid having throw rugs at the top or bottom of the stairs. If you do have throw rugs, attach them to the floor with carpet tape.  Make sure that you have a light switch at the top of the stairs and the bottom of the stairs. If you do not have them, ask someone to add them for you. What else can I  do to help prevent falls?  Wear shoes that:  Do not have high heels.  Have rubber bottoms.  Are comfortable and fit you well.  Are closed at the toe. Do not wear sandals.  If you use a stepladder:  Make sure that it is fully opened. Do not climb a closed stepladder.  Make sure that both sides of the stepladder are locked into place.  Ask someone to hold it for you, if possible.  Clearly mark and make sure that you can see:  Any grab bars or handrails.  First and last steps.  Where the edge of each step is.  Use tools that help you move around (mobility aids) if they are needed. These include:  Canes.  Walkers.  Scooters.  Crutches.  Turn on the lights when you go into a dark area. Replace any light bulbs as soon as they burn out.  Set up your furniture so you have a clear path. Avoid moving your furniture around.  If any of your floors are uneven, fix them.  If there are any pets around you, be aware of where they are.  Review your medicines with your doctor. Some medicines can make you feel dizzy. This can increase your chance of falling. Ask your doctor what other things that you can do to help prevent falls. This information is not intended to replace advice given to you by your health care provider. Make sure you discuss any questions you have with your health care provider. Document Released: 07/01/2009 Document Revised: 02/10/2016 Document Reviewed: 10/09/2014 Elsevier Interactive Patient Education  2017 [employer].

## 2016-12-19 NOTE — Progress Notes (Signed)
Subjective:   Diane Mullins is a 83 y.o. female who presents for Medicare Annual (Subsequent) preventive examination.  Review of Systems:  No ROS.  Medicare Wellness Visit.  Cardiac Risk Factors include: advanced age (>85men, >22 women);hypertension;diabetes mellitus;obesity (BMI >30kg/m2)     Objective:     Vitals: BP 134/68   Pulse 73   Temp 98.1 F (36.7 C) (Oral)   Wt 207 lb 12.8 oz (94.3 kg)   SpO2 93%   BMI 39.26 kg/m   Body mass index is 39.26 kg/m.   Tobacco History  Smoking Status  . Never Smoker  Smokeless Tobacco  . Never Used     Counseling given: Not Answered   Past Medical History:  Diagnosis Date  . Diabetes mellitus   . H/O: rheumatic fever   . Hyperlipidemia   . Hypertension   . Stroke [facility])    TIA Jan. 1st  . Thyroid disease    Past Surgical History:  Procedure Laterality Date  . ABDOMINAL HYSTERECTOMY  1973   menorrhagia  . HERNIA REPAIR  1994  . ORIF ANKLE FRACTURE Right 10/29/2015   Procedure: OPEN REDUCTION INTERNAL FIXATION (ORIF) ANKLE FRACTURE;  Surgeon: Thornton Park, MD;  Location: [facility] ORS;  Service: Orthopedics;  Laterality: Right;  Marland Kitchen VAGINAL DELIVERY     x4   Family History  Problem Relation Age of Onset  . Heart disease Mother 20  . Heart attack Mother 28  . Cancer Father     colon  . Diabetes Sister   . Hypertension Sister   . Cancer Brother     kidney  . Diabetes Brother   . Cancer Daughter     ovarian  . Non-Hodgkin's lymphoma Son    History  Sexual Activity  . Sexual activity: No    Outpatient Encounter Prescriptions as of 12/19/2016  Medication Sig  . albuterol (PROVENTIL HFA;VENTOLIN HFA) 108 (90 Base) MCG/ACT inhaler Inhale 2 puffs into the lungs every 6 (six) hours as needed for wheezing or shortness of breath.  . ALPRAZolam (XANAX) 0.25 MG tablet Take 1 tablet (0.25 mg total) by mouth 3 (three) times daily as needed for sleep or anxiety.  Marland Kitchen aspirin 325 MG tablet Take 1 tablet (325 mg total) by  mouth daily.  . calcium-vitamin D (OSCAL) 250-125 MG-UNIT per tablet Take 1 tablet by mouth daily.  . cholecalciferol (VITAMIN D) 1000 units tablet Take 1,000 Units by mouth daily.  . fish oil-omega-3 fatty acids 1000 MG capsule Take 1 g by mouth 2 (two) times daily.   . furosemide (LASIX) 20 MG tablet Take 10mg  by mouth every day  . glimepiride (AMARYL) 4 MG tablet TAKE 1 TABLET EVERY MORNING  AND TAKE 1/2 TABLET EVERY EVENING  . glucose blood (ACCU-CHEK AVIVA PLUS) test strip Use as instructed to check blood glucose three times a day. Please dispense One touch strips. E11.65  . glucose blood (ONE TOUCH ULTRA TEST) test strip Use as instructed to check Blood sugar three times a day.  E11.65 Dispense patient choice.  . Insulin Glargine (LANTUS SOLOSTAR) 100 UNIT/ML Solostar Pen INJECT  15 UNITS SUBCUTANEOUSLY IN THE MORNING  . levothyroxine (SYNTHROID, LEVOTHROID) 75 MCG tablet Take 1 tablet (75 mcg total) by mouth daily.  Marland Kitchen losartan (COZAAR) 50 MG tablet Take 1 tablet (50 mg total) by mouth daily.  . metFORMIN (GLUCOPHAGE) 1000 MG tablet Take 1 tablet (1,000 mg total) by mouth 2 (two) times daily with a meal. TAKE 1 TABLET TWICE DAILY  WITH  A  MEAL  . metoprolol succinate (TOPROL-XL) 100 MG 24 hr tablet Take 1 tablet (100 mg total) by mouth daily.  . Multiple Vitamins-Minerals (MULTIVITAMIN WITH MINERALS) tablet Take 1 tablet by mouth daily.  Marland Kitchen omeprazole (PRILOSEC) 20 MG capsule Take 1 capsule (20 mg total) by mouth daily.  . simvastatin (ZOCOR) 20 MG tablet Take 1 tablet (20 mg total) by mouth every evening.  . [DISCONTINUED] azithromycin (ZITHROMAX) 250 MG tablet Please take 500 mg (2 tablets) by mouth today, then take 250 mg (one tablet) by mouth daily for 4 days  . [DISCONTINUED] furosemide (LASIX) 20 MG tablet Take 10mg  by mouth every day  . Insulin Pen Needle (PEN NEEDLES) 32G X 4 MM MISC Inject 1 Dose into the skin daily.   No facility-administered encounter medications on file as of  12/19/2016.     Activities of Daily Living In your present state of health, do you have any difficulty performing the following activities: 12/19/2016  Hearing? Y  Vision? N  Difficulty concentrating or making decisions? N  Walking or climbing stairs? Y  Dressing or bathing? N  Doing errands, shopping? N  Preparing Food and eating ? Y  Using the Toilet? N  In the past six months, have you accidently leaked urine? Y  Do you have problems with loss of bowel control? N  Managing your Medications? Y  Managing your Finances? Y  Housekeeping or managing your Housekeeping? Y  Some recent data might be hidden    Patient Care Team: [name], MD as PCP - General (Family Medicine)    Assessment:    This is a routine wellness examination for Diane Mullins. The goal of the wellness visit is to assist the patient how to close the gaps in care and create a preventative care plan for the patient.   Taking calcium VIT D as appropriate/Osteoporosis risk reviewed.  Medications reviewed; taking without issues or barriers.  Safety issues reviewed; smoke detectors in the home. No firearms in the home.  Wears seatbelts when driving or riding with others. Patient does wear sunscreen or protective clothing when in direct sunlight. No violence in the home.  Patient is alert, normal appearance, oriented to person/place/and time. Correctly identified the president of the [place], recall of 2/3 words, and performing simple calculations.  Patient displays appropriate judgement and can read correct time from watch face.  No new identified risk were noted.  No failures at ADL's or IADL's. Ambulates with walker.  BMI- discussed the importance of a healthy diet, water intake and exercise. Educational material provided.   Daily fluid intake: 6-8 cups of water  HTN- followed by PCP.  Dental- every six months.  Eye- Visual acuity not assessed per patient preference since they have regular follow up with  the ophthalmologist.  Wears corrective lenses.  Sleep patterns- Sleeps 8 hours at night.  Wakes feeling rested.  Health maintenance gaps- closed.  Patient Concerns: None at this time. Follow up with PCP as needed.  Exercise Activities and Dietary recommendations Current Exercise Habits: Home exercise routine, Time (Minutes): 10, Frequency (Times/Week): 3, Weekly Exercise (Minutes/Week): 30, Intensity: Mild  Goals    . Increase physical activity          Chair exercises.  Home physical therapy exercises.      Fall Risk Fall Risk  12/19/2016 03/24/2016 08/19/2015 09/07/2014 12/09/2012  Falls in the past year? Yes Yes Yes No No  Number falls in past yr: 1 2 or more  2 or more - -  Injury with Fall? Yes Yes - - -  Risk Factor Category  High Fall Risk High Fall Risk High Fall Risk - -  Risk for fall due to : History of fall(s) - - - -  Follow up Education provided;Falls prevention discussed - Education provided;Falls prevention discussed - -   Depression Screen PHQ 2/9 Scores 12/19/2016 03/24/2016 08/19/2015 09/07/2014  PHQ - 2 Score 0 0 0 0     Cognitive Function MMSE - Mini Mental State Exam 08/19/2015  Orientation to time 5  Orientation to Place 5  Registration 3  Attention/ Calculation 5  Recall 3  Language- name 2 objects 2  Language- repeat 1  Language- follow 3 step command 3  Language- read & follow direction 1  Write a sentence 1  Copy design 1  Total score 30     6CIT Screen 12/19/2016  What Year? 0 points  What month? 0 points  What time? 0 points  Count back from 20 0 points  Months in reverse 0 points    Immunization History  Administered Date(s) Administered  . Influenza Split 09/19/2011, 05/27/2012  . Influenza,inj,Quad PF,36+ Mos 07/02/2013, 06/03/2014, 06/17/2015  . Pneumococcal Conjugate-13 12/01/2013  . Pneumococcal Polysaccharide-23 12/22/2011  . Tdap 06/19/2011  . Zoster 06/19/2007   Screening Tests Health Maintenance  Topic Date Due  .  HEMOGLOBIN A1C  01/22/2017  . INFLUENZA VACCINE  04/18/2017  . OPHTHALMOLOGY EXAM  06/06/2017  . FOOT EXAM  07/25/2017  . TETANUS/TDAP  06/18/2021  . DEXA SCAN  Completed  . PNA vac Low Risk Adult  Completed      Plan:    End of life planning; Advance aging; Advanced directives discussed. Copy of current HCPOA/Living Will requested.    Medicare Attestation I have personally reviewed: The patient's medical and social history Their use of alcohol, tobacco or illicit drugs Their current medications and supplements The patient's functional ability including ADLs,fall risks, home safety risks, cognitive, and hearing and visual impairment Diet and physical activities Evidence for depression   The patient's weight, height, BMI, and visual acuity have been recorded in the chart.  I have made referrals and provided education to the patient based on review of the above and I have provided the patient with a written personalized care plan for preventive services.    During the course of the visit the patient was educated and counseled about the following appropriate screening and preventive services:   Vaccines to include Pneumoccal, Influenza, Hepatitis B, Td, Zostavax, HCV  Colorectal cancer screening-UTD  Bone density screening-UTD  Diabetes-followed by PCP  Glaucoma screening-annual eye exam  Mammography-UTD  Nutrition counseling   Patient Instructions (the written plan) was given to the patient.   Varney Biles, LPN  11/19/8327

## 2016-12-19 NOTE — Assessment & Plan Note (Signed)
Patient has recovered following hospitalization for this in January. She'll continue to do home exercises to help regain stamina.

## 2016-12-19 NOTE — Telephone Encounter (Signed)
Pt was here today and had a POC Urinalysis ordered but was unable to give sample. Pt state her daughter will bring sample on Friday to be ran.

## 2016-12-19 NOTE — Assessment & Plan Note (Signed)
Well-controlled. Continue current medications. Check lab work.

## 2016-12-19 NOTE — Assessment & Plan Note (Signed)
Continue Synthroid.  Check TSH. 

## 2016-12-20 ENCOUNTER — Other Ambulatory Visit: Payer: Self-pay | Admitting: Family Medicine

## 2016-12-20 LAB — COMPREHENSIVE METABOLIC PANEL
ALBUMIN: 3.9 g/dL (ref 3.5–5.2)
ALK PHOS: 103 U/L (ref 39–117)
ALT: 10 U/L (ref 0–35)
AST: 11 U/L (ref 0–37)
BILIRUBIN TOTAL: 0.2 mg/dL (ref 0.2–1.2)
BUN: 13 mg/dL (ref 6–23)
CALCIUM: 9.4 mg/dL (ref 8.4–10.5)
CO2: 29 mEq/L (ref 19–32)
Chloride: 103 mEq/L (ref 96–112)
Creatinine, Ser: 0.95 mg/dL (ref 0.40–1.20)
GFR: 59.82 mL/min — AB (ref >60.00)
GLUCOSE: 204 mg/dL — AB (ref 70–99)
POTASSIUM: 4.9 meq/L (ref 3.5–5.1)
Sodium: 139 mEq/L (ref 135–145)
Total Protein: 6.3 g/dL (ref 6.0–8.3)

## 2016-12-20 LAB — HEMOGLOBIN A1C: HEMOGLOBIN A1C: 8.5 % — AB (ref 4.6–6.5)

## 2016-12-20 LAB — MAGNESIUM: MAGNESIUM: 1.3 mg/dL — AB (ref 1.5–2.5)

## 2016-12-20 LAB — TSH: TSH: 2.25 u[IU]/mL (ref 0.35–4.50)

## 2016-12-20 MED ORDER — MAGNESIUM CHLORIDE 64 MG PO TBEC: 2.0000 | DELAYED_RELEASE_TABLET | Freq: Every day | ORAL | 0 refills | Status: DC

## 2016-12-21 ENCOUNTER — Telehealth: Payer: Self-pay | Admitting: *Deleted

## 2016-12-21 ENCOUNTER — Other Ambulatory Visit: Payer: Self-pay | Admitting: Family Medicine

## 2016-12-21 NOTE — Telephone Encounter (Signed)
Called patient and informed her that it was sent, called and [facility] informed [facility] did not [facility] it

## 2016-12-21 NOTE — Telephone Encounter (Signed)
Patient stated that she was to receive magnesium sent to the pharmacy, however the pharmacy has not received this Rx.  Pt contact [phone]

## 2016-12-21 NOTE — Telephone Encounter (Signed)
Pt called back about the Rx. Per Janett Billow she spoke back with pharmacist regarding the medication. I spoke with pt and pt stated that she wanted to know if it was OTC or Rx. Per Janett Billow she said she got it straight.

## 2016-12-21 NOTE — Telephone Encounter (Signed)
[facility] [address]. Please see if that is the correct pharmacy.

## 2016-12-21 NOTE — Telephone Encounter (Signed)
Please advise 

## 2016-12-22 ENCOUNTER — Other Ambulatory Visit: Payer: PPO

## 2016-12-22 LAB — POCT URINALYSIS DIPSTICK
Bilirubin, UA: NEGATIVE
Blood, UA: NEGATIVE
Glucose, UA: NEGATIVE
Ketones, UA: NEGATIVE
Leukocytes, UA: NEGATIVE
Nitrite, UA: NEGATIVE
Protein, UA: NEGATIVE
Spec Grav, UA: 1.005
Urobilinogen, UA: 0.2
pH, UA: 5

## 2016-12-22 NOTE — Addendum Note (Signed)
Addended by: Elpidio Galea T on: 12/22/2016 01:05 PM   Modules accepted: Orders

## 2016-12-26 ENCOUNTER — Other Ambulatory Visit (INDEPENDENT_AMBULATORY_CARE_PROVIDER_SITE_OTHER): Payer: PPO

## 2016-12-26 ENCOUNTER — Other Ambulatory Visit: Payer: PPO

## 2016-12-26 LAB — MAGNESIUM: Magnesium: 1.5 mg/dL (ref 1.5–2.5)

## 2016-12-27 ENCOUNTER — Encounter: Payer: Self-pay | Admitting: *Deleted

## 2017-02-20 ENCOUNTER — Other Ambulatory Visit: Payer: Self-pay | Admitting: Family Medicine

## 2017-02-20 DIAGNOSIS — Z760 Encounter for issue of repeat prescription: Secondary | ICD-10-CM

## 2017-02-20 MED ORDER — GLIMEPIRIDE 4 MG PO TABS: ORAL_TABLET | ORAL | 1 refills | Status: DC

## 2017-02-20 NOTE — Telephone Encounter (Signed)
Pt called about needing a refill for glimepiride (AMARYL) 4 MG tablet.  [id number] [place], [place]  Call pt @ [phone]. Thank you!

## 2017-02-20 NOTE — Telephone Encounter (Signed)
Last Ov 12/19/16 last filled by Dr.Walker 03/24/16 135 1rf

## 2017-03-09 ENCOUNTER — Other Ambulatory Visit: Payer: Self-pay

## 2017-03-09 DIAGNOSIS — Z760 Encounter for issue of repeat prescription: Secondary | ICD-10-CM

## 2017-03-09 DIAGNOSIS — I10 Essential (primary) hypertension: Secondary | ICD-10-CM

## 2017-03-09 MED ORDER — LOSARTAN POTASSIUM 50 MG PO TABS: 50.0000 mg | ORAL_TABLET | Freq: Every day | ORAL | 3 refills | Status: DC

## 2017-03-09 MED ORDER — GLIMEPIRIDE 4 MG PO TABS: ORAL_TABLET | ORAL | 1 refills | Status: DC

## 2017-03-09 MED ORDER — METOPROLOL SUCCINATE ER 100 MG PO TB24: 100.0000 mg | ORAL_TABLET | Freq: Every day | ORAL | 3 refills | Status: DC

## 2017-03-09 MED ORDER — SIMVASTATIN 20 MG PO TABS: 20.0000 mg | ORAL_TABLET | Freq: Every evening | ORAL | 3 refills | Status: DC

## 2017-03-09 NOTE — Telephone Encounter (Signed)
Sent to pharmacy 

## 2017-03-09 NOTE — Telephone Encounter (Signed)
Last OV 12/19/16 last filled Glimepiride [facility]/20/17 [facility] Metoprolol filled by Dr.Walker Simvastatin filled by Dr.Walker Losartan filled by Dr.Walker

## 2017-04-17 ENCOUNTER — Ambulatory Visit (INDEPENDENT_AMBULATORY_CARE_PROVIDER_SITE_OTHER): Payer: PPO | Admitting: Family Medicine

## 2017-04-17 ENCOUNTER — Encounter: Payer: Self-pay | Admitting: Family Medicine

## 2017-04-17 VITALS — BP 124/70 | HR 66 | Temp 97.8°F | Wt 214.0 lb

## 2017-04-17 DIAGNOSIS — F4322 Adjustment disorder with anxiety: Secondary | ICD-10-CM | POA: Diagnosis not present

## 2017-04-17 DIAGNOSIS — I10 Essential (primary) hypertension: Secondary | ICD-10-CM | POA: Diagnosis not present

## 2017-04-17 DIAGNOSIS — E785 Hyperlipidemia, unspecified: Secondary | ICD-10-CM

## 2017-04-17 DIAGNOSIS — Z800 Family history of malignant neoplasm of digestive organs: Secondary | ICD-10-CM | POA: Insufficient documentation

## 2017-04-17 DIAGNOSIS — Z760 Encounter for issue of repeat prescription: Secondary | ICD-10-CM | POA: Diagnosis not present

## 2017-04-17 DIAGNOSIS — Z794 Long term (current) use of insulin: Secondary | ICD-10-CM | POA: Diagnosis not present

## 2017-04-17 DIAGNOSIS — E119 Type 2 diabetes mellitus without complications: Secondary | ICD-10-CM | POA: Diagnosis not present

## 2017-04-17 MED ORDER — SIMVASTATIN 20 MG PO TABS: 20.0000 mg | ORAL_TABLET | Freq: Every evening | ORAL | 3 refills | Status: DC

## 2017-04-17 MED ORDER — LOSARTAN POTASSIUM 50 MG PO TABS: 50.0000 mg | ORAL_TABLET | Freq: Every day | ORAL | 3 refills | Status: DC

## 2017-04-17 MED ORDER — METOPROLOL SUCCINATE ER 100 MG PO TB24: 100.0000 mg | ORAL_TABLET | Freq: Every day | ORAL | 3 refills | Status: DC

## 2017-04-17 MED ORDER — OMEPRAZOLE 20 MG PO CPDR: 20.0000 mg | DELAYED_RELEASE_CAPSULE | Freq: Every day | ORAL | 3 refills | Status: DC

## 2017-04-17 MED ORDER — GLIMEPIRIDE 4 MG PO TABS: ORAL_TABLET | ORAL | 1 refills | Status: DC

## 2017-04-17 MED ORDER — INSULIN GLARGINE 100 UNIT/ML SOLOSTAR PEN: PEN_INJECTOR | SUBCUTANEOUS | 6 refills | Status: DC

## 2017-04-17 MED ORDER — LEVOTHYROXINE SODIUM 75 MCG PO TABS: 75.0000 ug | ORAL_TABLET | Freq: Every day | ORAL | 3 refills | Status: DC

## 2017-04-17 MED ORDER — METFORMIN HCL 1000 MG PO TABS: 1000.0000 mg | ORAL_TABLET | Freq: Two times a day (BID) | ORAL | 3 refills | Status: DC

## 2017-04-17 NOTE — Patient Instructions (Signed)
Nice to see you. We will have you return for fasting lab work. Please complete the stool cards for colon cancer screening.

## 2017-04-17 NOTE — Assessment & Plan Note (Signed)
Well-controlled. Rare use of Xanax.

## 2017-04-17 NOTE — Assessment & Plan Note (Signed)
Father with history of colon cancer. Due for colon cancer screening. Reports a single polyp previously. Given age she is concerned about colonoscopy. Discussed option of cologuard versus FOBT home test. She opted for FOBT. Stool cards were given.

## 2017-04-17 NOTE — Assessment & Plan Note (Signed)
Continue current medications. Return for A1c with fasting lipid panel.

## 2017-04-17 NOTE — Progress Notes (Signed)
Tommi Rumps, MD Phone: [phone]  Diane Mullins is a 83 y.o. female who presents today for f/u.  HYPERTENSION Disease Monitoring: Blood pressure range-not checking Chest pain- no      Dyspnea- no Medications: Compliance- taking losartan, metoprolol   Edema- no  DIABETES Disease Monitoring: Blood Sugar ranges-100s Polyuria/phagia/dipsia- no     Optho- UTD Medications: Compliance- taking amaryl, lantus 15, metformin Hypoglycemic symptoms- yes, rarely will wake up in the middle the night and feels sweaty. She'll eat something and feels better.  HYPERLIPIDEMIA Disease Monitoring: See symptoms for Hypertension Medications: Compliance- taking simvastatin due for lab work.  Anxiety: Rarely takes Xanax. Occasionally she'll feel anxious she lives and when her grandchildren come over. No depression.  Family history of colon cancer. 5 years since her last colonoscopy. Completed at [facility]. She's not had any issues. Father had colon cancer.   PMH: nonsmoker.   ROS see history of present illness  Objective  Physical Exam Vitals:   04/17/17 1415  BP: 124/70  Pulse: 66  Temp: 97.8 F (36.6 C)    BP Readings from Last 3 Encounters:  04/17/17 124/70  12/19/16 134/68  07/25/16 138/72   Wt Readings from Last 3 Encounters:  04/17/17 214 lb (97.1 kg)  12/19/16 207 lb 12.8 oz (94.3 kg)  07/25/16 208 lb 6.4 oz (94.5 kg)    Physical Exam  Constitutional: No distress.  Cardiovascular: Normal rate, regular rhythm and normal heart sounds.   Pulmonary/Chest: Effort normal and breath sounds normal.  Musculoskeletal: She exhibits no edema.  Neurological: She is alert.  Skin: Skin is warm and dry. She is not diaphoretic.     Assessment/Plan: Please see individual problem list.  Hypertension At goal. Continue current medication.  Diabetes ([place]) Continue current medications. Return for A1c with fasting lipid panel.  Hyperlipidemia Continue simvastatin. Check  fasting lipid panel.  Adjustment disorder with anxious mood Well-controlled. Rare use of Xanax.  Family history of colon cancer Father with history of colon cancer. Due for colon cancer screening. Reports a single polyp previously. Given age she is concerned about colonoscopy. Discussed option of cologuard versus FOBT home test. She opted for FOBT. Stool cards were given.   Orders Placed This Encounter  Procedures  . Fecal occult blood, imunochemical    Standing Status:   Future    Standing Expiration Date:   04/17/2018  . Lipid panel    Standing Status:   Future    Standing Expiration Date:   04/17/2018  . Comp Met (CMET)    Standing Status:   Future    Standing Expiration Date:   04/17/2018  . Hemoglobin A1c    Standing Status:   Future    Standing Expiration Date:   04/17/2018    Meds ordered this encounter  Medications  . simvastatin (ZOCOR) 20 MG tablet    Sig: Take 1 tablet (20 mg total) by mouth every evening.    Dispense:  90 tablet    Refill:  3  . omeprazole (PRILOSEC) 20 MG capsule    Sig: Take 1 capsule (20 mg total) by mouth daily.    Dispense:  90 capsule    Refill:  3  . metoprolol succinate (TOPROL-XL) 100 MG 24 hr tablet    Sig: Take 1 tablet (100 mg total) by mouth daily.    Dispense:  90 tablet    Refill:  3  . metFORMIN (GLUCOPHAGE) 1000 MG tablet    Sig: Take 1 tablet (1,000 mg total)  by mouth 2 (two) times daily with a meal. TAKE 1 TABLET TWICE DAILY  WITH  A  MEAL    Dispense:  180 tablet    Refill:  3  . losartan (COZAAR) 50 MG tablet    Sig: Take 1 tablet (50 mg total) by mouth daily.    Dispense:  90 tablet    Refill:  3  . levothyroxine (SYNTHROID, LEVOTHROID) 75 MCG tablet    Sig: Take 1 tablet (75 mcg total) by mouth daily.    Dispense:  90 tablet    Refill:  3  . Insulin Glargine (LANTUS SOLOSTAR) 100 UNIT/ML Solostar Pen    Sig: INJECT  15 UNITS SUBCUTANEOUSLY IN THE MORNING    Dispense:  15 mL    Refill:  6  . glimepiride (AMARYL) 4  MG tablet    Sig: TAKE 1 TABLET EVERY MORNING  AND TAKE 1/2 TABLET EVERY EVENING    Dispense:  135 tablet    Refill:  1    Tommi Rumps, MD [place]

## 2017-04-17 NOTE — Assessment & Plan Note (Signed)
Continue simvastatin. Check fasting lipid panel.

## 2017-04-17 NOTE — Assessment & Plan Note (Signed)
At goal. Continue current medication. 

## 2017-04-26 ENCOUNTER — Other Ambulatory Visit: Payer: PPO

## 2017-05-10 ENCOUNTER — Other Ambulatory Visit (INDEPENDENT_AMBULATORY_CARE_PROVIDER_SITE_OTHER): Payer: PPO

## 2017-05-10 DIAGNOSIS — I10 Essential (primary) hypertension: Secondary | ICD-10-CM

## 2017-05-10 DIAGNOSIS — Z794 Long term (current) use of insulin: Secondary | ICD-10-CM

## 2017-05-10 DIAGNOSIS — E785 Hyperlipidemia, unspecified: Secondary | ICD-10-CM

## 2017-05-10 DIAGNOSIS — E119 Type 2 diabetes mellitus without complications: Secondary | ICD-10-CM | POA: Diagnosis not present

## 2017-05-10 LAB — COMPREHENSIVE METABOLIC PANEL
ALT: 10 U/L (ref 0–35)
AST: 14 U/L (ref 0–37)
Albumin: 3.9 g/dL (ref 3.5–5.2)
Alkaline Phosphatase: 97 U/L (ref 39–117)
BILIRUBIN TOTAL: 0.3 mg/dL (ref 0.2–1.2)
BUN: 11 mg/dL (ref 6–23)
CO2: 32 meq/L (ref 19–32)
Calcium: 9.3 mg/dL (ref 8.4–10.5)
Chloride: 104 mEq/L (ref 96–112)
Creatinine, Ser: 1.03 mg/dL (ref 0.40–1.20)
GFR: 54.44 mL/min — AB (ref >60.00)
GLUCOSE: 150 mg/dL — AB (ref 70–99)
Potassium: 4.6 mEq/L (ref 3.5–5.1)
Sodium: 141 mEq/L (ref 135–145)
Total Protein: 6.7 g/dL (ref 6.0–8.3)

## 2017-05-10 LAB — LIPID PANEL
CHOL/HDL RATIO: 4
Cholesterol: 170 mg/dL (ref 0–200)
HDL: 45.1 mg/dL (ref >39.00)
LDL Cholesterol: 90 mg/dL (ref 0–99)
NONHDL: 124.99
Triglycerides: 176 mg/dL — ABNORMAL HIGH (ref 0.0–149.0)
VLDL: 35.2 mg/dL (ref 0.0–40.0)

## 2017-05-10 LAB — HEMOGLOBIN A1C: Hgb A1c MFr Bld: 8.8 % — ABNORMAL HIGH (ref 4.6–6.5)

## 2017-05-17 ENCOUNTER — Other Ambulatory Visit: Payer: PPO

## 2017-05-25 ENCOUNTER — Other Ambulatory Visit (INDEPENDENT_AMBULATORY_CARE_PROVIDER_SITE_OTHER): Payer: PPO

## 2017-05-25 DIAGNOSIS — Z800 Family history of malignant neoplasm of digestive organs: Secondary | ICD-10-CM

## 2017-05-25 LAB — FECAL OCCULT BLOOD, IMMUNOCHEMICAL: Fecal Occult Bld: NEGATIVE

## 2017-06-05 DIAGNOSIS — H2513 Age-related nuclear cataract, bilateral: Secondary | ICD-10-CM | POA: Diagnosis not present

## 2017-06-05 LAB — HM DIABETES EYE EXAM

## 2017-06-06 ENCOUNTER — Encounter: Payer: Self-pay | Admitting: Family Medicine

## 2017-06-12 ENCOUNTER — Other Ambulatory Visit: Payer: Self-pay | Admitting: Family Medicine

## 2017-06-12 DIAGNOSIS — E785 Hyperlipidemia, unspecified: Secondary | ICD-10-CM

## 2017-06-12 MED ORDER — SIMVASTATIN 40 MG PO TABS: 40.0000 mg | ORAL_TABLET | Freq: Every evening | ORAL | 1 refills | Status: DC

## 2017-06-15 ENCOUNTER — Other Ambulatory Visit: Payer: Self-pay | Admitting: Family Medicine

## 2017-06-15 MED ORDER — EMPAGLIFLOZIN 10 MG PO TABS: 10.0000 mg | ORAL_TABLET | Freq: Every day | ORAL | 3 refills | Status: DC

## 2017-06-18 ENCOUNTER — Telehealth: Payer: Self-pay | Admitting: Family Medicine

## 2017-06-18 NOTE — Telephone Encounter (Signed)
Pt daughter called wanting to know if we have any Jardiance coupons available to help out with pt Rx? Please advise?  Call daughter @ [phone]. thankyou!

## 2017-06-18 NOTE — Telephone Encounter (Signed)
Pt called and stated that she cannot find out how much the medication is until it is filled. She asked that you go ahead and call in the [id number] [place], [place].  She asked that you call her when filled. Thank you!  Call pt @ [phone]

## 2017-06-18 NOTE — Telephone Encounter (Signed)
This was sent in last week.

## 2017-06-18 NOTE — Telephone Encounter (Signed)
Informed patient

## 2017-06-18 NOTE — Telephone Encounter (Signed)
Please advise 

## 2017-06-18 NOTE — Telephone Encounter (Signed)
Coupon at front desk

## 2017-07-09 ENCOUNTER — Ambulatory Visit (INDEPENDENT_AMBULATORY_CARE_PROVIDER_SITE_OTHER): Payer: PPO | Admitting: Family Medicine

## 2017-07-09 ENCOUNTER — Ambulatory Visit: Payer: PPO | Admitting: Family Medicine

## 2017-07-09 ENCOUNTER — Encounter: Payer: Self-pay | Admitting: Family Medicine

## 2017-07-09 VITALS — BP 144/84 | HR 77 | Temp 97.8°F | Wt 210.8 lb

## 2017-07-09 DIAGNOSIS — R300 Dysuria: Secondary | ICD-10-CM | POA: Diagnosis not present

## 2017-07-09 LAB — POC URINALSYSI DIPSTICK (AUTOMATED)
BILIRUBIN UA: NEGATIVE
KETONES UA: NEGATIVE
Leukocytes, UA: NEGATIVE
Nitrite, UA: NEGATIVE
PH UA: 6 (ref 5.0–8.0)
Protein, UA: NEGATIVE
RBC UA: NEGATIVE
SPEC GRAV UA: 1.02 (ref 1.010–1.025)
Urobilinogen, UA: 0.2 E.U./dL

## 2017-07-09 MED ORDER — SULFAMETHOXAZOLE-TRIMETHOPRIM 400-80 MG PO TABS: 1.0000 | ORAL_TABLET | Freq: Two times a day (BID) | ORAL | 0 refills | Status: DC

## 2017-07-09 NOTE — Patient Instructions (Signed)
Drink plenty of water and start the antibiotics today.  We'll contact you with your lab report.  Take care.  Glad to see you.

## 2017-07-09 NOTE — Progress Notes (Signed)
Dysuria: yes, burning, urgency, frequency duration of symptoms: ~4 days.   abdominal pain: no fevers:no back pain: no Vomiting: no other concerns: on jardiance, d/w pt.  Started about 1 month ago.   AZO helped some with dysuria.  She stopped med yesterday.    Meds, vitals, and allergies reviewed.   Per HPI unless specifically indicated in ROS section   GEN: nad, alert and oriented HEENT: mucous membranes moist NECK: supple CV: rrr.  PULM: ctab, no inc wob ABD: soft, +bs, suprapubic area not tender EXT: no edema BACK: no CVA pain

## 2017-07-10 ENCOUNTER — Ambulatory Visit: Payer: PPO | Admitting: Family Medicine

## 2017-07-10 DIAGNOSIS — R300 Dysuria: Secondary | ICD-10-CM | POA: Insufficient documentation

## 2017-07-10 NOTE — Assessment & Plan Note (Signed)
Presumed cystitis. Nontoxic. Okay for outpatient follow-up. Check urine culture. Start Septra. Discussed with patient. She agrees.

## 2017-07-11 LAB — URINE CULTURE
MICRO NUMBER: 81178185
SPECIMEN QUALITY: ADEQUATE

## 2017-07-13 ENCOUNTER — Telehealth: Payer: Self-pay | Admitting: Family Medicine

## 2017-07-13 ENCOUNTER — Telehealth: Payer: Self-pay

## 2017-07-13 NOTE — Telephone Encounter (Signed)
Patient advised.

## 2017-07-13 NOTE — Telephone Encounter (Signed)
Patient asked could she stop taking jardiance until she can be seen. Advised she should go to urgent care or walk in clinic instead until Monday. Patient did not specify whether she was going to disagree or comply before she hung up.

## 2017-07-13 NOTE — Telephone Encounter (Signed)
Please see the previous notes. The patient continues to have problems needing to be seen . Please advise.

## 2017-07-13 NOTE — Telephone Encounter (Signed)
Copied from [place] #[id number]. Topic: Quick Communication - See Telephone Encounter >> Jul 13, 2017  2:06 PM Vernona Rieger wrote: CRM for notification. See Telephone encounter for:  07/13/17. Patient is returning a call from the office. She was seen yesterday for a UTI. She just wants to know what were the results from her urine sample

## 2017-07-13 NOTE — Telephone Encounter (Signed)
Please see other phone message  

## 2017-07-13 NOTE — Telephone Encounter (Signed)
No.  Needs recheck with PCP or elsewhere.  If not resolved, then ext abx likely not to help, esp if sx are related to jardiance and not an infection.

## 2017-07-13 NOTE — Telephone Encounter (Signed)
Patient was seen on 07/09/2017 by Dr. Damita Dunnings for dysuria. Patient had UA done and it resulted as contaminated by outside genitalia. Patient is still having symptoms. Can we place on the schedule for repeat UA or should we send to UC?

## 2017-07-13 NOTE — Telephone Encounter (Signed)
Patient advised. Patient states she is still symptomatic and it takes a long time to get an appointment with her PCP.  Patient asks if Dr. Damita Dunnings will extend her ABX?

## 2017-07-13 NOTE — Telephone Encounter (Signed)
Please let the patient know she should be evaluated in urgent care or walk-in clinic given we are heading into the weekend. Thanks.

## 2017-07-14 NOTE — Telephone Encounter (Signed)
She can discontinue the Jardiance. Please see if she was evaluated over the weekend. Thanks.

## 2017-07-16 NOTE — Telephone Encounter (Signed)
Patient stopped taking jardiance over the weekend and says she feels much much better. She said she had a little bit of burning with urination last night but no symptoms today. Offered appointment at [facility] to go in and be evaluated and have urine tested. Patient declined and wanted to wait for open appointment with you. Scheduled patient with you on Monday 11/5. Advised patient to be seen earlier if she develops any worsening symptoms. Patient agreed to comply.

## 2017-07-16 NOTE — Telephone Encounter (Signed)
Attempted to reach patient. Phone kept ringing. Unable to leave message. 

## 2017-07-16 NOTE — Telephone Encounter (Signed)
Noted and agree. 

## 2017-07-23 ENCOUNTER — Encounter: Payer: Self-pay | Admitting: Family Medicine

## 2017-07-23 ENCOUNTER — Other Ambulatory Visit (HOSPITAL_COMMUNITY)
Admission: RE | Admit: 2017-07-23 | Discharge: 2017-07-23 | Disposition: A | Discharge status: Home / Self Care | Payer: PPO | Source: Ambulatory Visit | Attending: Family Medicine | Admitting: Family Medicine

## 2017-07-23 ENCOUNTER — Ambulatory Visit (INDEPENDENT_AMBULATORY_CARE_PROVIDER_SITE_OTHER): Payer: PPO | Admitting: Family Medicine

## 2017-07-23 VITALS — BP 140/70 | HR 63 | Temp 98.0°F | Wt 214.0 lb

## 2017-07-23 DIAGNOSIS — E785 Hyperlipidemia, unspecified: Secondary | ICD-10-CM | POA: Insufficient documentation

## 2017-07-23 DIAGNOSIS — N898 Other specified noninflammatory disorders of vagina: Secondary | ICD-10-CM | POA: Diagnosis not present

## 2017-07-23 DIAGNOSIS — R300 Dysuria: Secondary | ICD-10-CM | POA: Diagnosis not present

## 2017-07-23 LAB — POCT URINALYSIS DIPSTICK
Bilirubin, UA: NEGATIVE
Glucose, UA: NEGATIVE
KETONES UA: NEGATIVE
Nitrite, UA: NEGATIVE
PH UA: 5 (ref 5.0–8.0)
PROTEIN UA: NEGATIVE
Spec Grav, UA: <=1.005 — AB (ref 1.010–1.025)
Urobilinogen, UA: 0.2 E.U./dL

## 2017-07-23 LAB — HEPATIC FUNCTION PANEL
ALBUMIN: 3.9 g/dL (ref 3.5–5.2)
ALT: 11 U/L (ref 0–35)
AST: 14 U/L (ref 0–37)
Alkaline Phosphatase: 95 U/L (ref 39–117)
Bilirubin, Direct: 0 mg/dL (ref 0.0–0.3)
TOTAL PROTEIN: 6.7 g/dL (ref 6.0–8.3)
Total Bilirubin: 0.3 mg/dL (ref 0.2–1.2)

## 2017-07-23 LAB — URINALYSIS, MICROSCOPIC ONLY

## 2017-07-23 LAB — LDL CHOLESTEROL, DIRECT: Direct LDL: 84 mg/dL

## 2017-07-23 MED ORDER — KETOCONAZOLE 2 % EX CREA: 1.0000 "application " | TOPICAL_CREAM | Freq: Every day | CUTANEOUS | 0 refills | Status: DC

## 2017-07-23 MED ORDER — FLUCONAZOLE 150 MG PO TABS: 150.0000 mg | ORAL_TABLET | Freq: Once | ORAL | 0 refills | Status: AC

## 2017-07-23 NOTE — Progress Notes (Signed)
  Tommi Rumps, MD Phone: [phone]  Diane Mullins is a 83 y.o. female who presents today for same-day visit.  Patient notes for the last several weeks she has had dysuria and urinary urgency with some incontinence.  She was evaluated at 1 of our other offices and placed on an antibiotic for UTI.  She did not notice much of a difference with that though she has since stopped the Jardiance and notes her symptoms have improved quite a bit.  She does still note some urgency.  She has had some vaginal itching though no discharge.  No fevers.  No abdominal pain.  The dysuria is significantly improved as well.  PMH: prior history of vaginal yeast infection   ROS see history of present illness  Objective  Physical Exam Vitals:   07/23/17 1322  BP: 140/70  Pulse: 63  Temp: 98 F (36.7 C)  SpO2: 97%    BP Readings from Last 3 Encounters:  07/23/17 140/70  07/09/17 (!) 144/84  04/17/17 124/70   Wt Readings from Last 3 Encounters:  07/23/17 214 lb (97.1 kg)  07/09/17 210 lb 12 oz (95.6 kg)  04/17/17 214 lb (97.1 kg)    Physical Exam  Constitutional: No distress.  HENT:  Head: Normocephalic and atraumatic.  Mouth/Throat: Oropharynx is clear and moist. No oropharyngeal exudate.  Eyes: Conjunctivae are normal. Pupils are equal, round, and reactive to light.  Cardiovascular: Normal rate, regular rhythm and normal heart sounds.  Pulmonary/Chest: Effort normal and breath sounds normal.  Abdominal: Soft. Bowel sounds are normal. She exhibits no distension. There is no tenderness.  Genitourinary:  Genitourinary Comments: Patient with inguinal candidal intertrigo with satellite lesions, normal vaginal mucosa with very minimal white discharge, cervix not appreciated in patient status post hysterectomy, otherwise benign bimanual exam  Skin: She is not diaphoretic.     Assessment/Plan: Please see individual problem list.  Dysuria Symptoms seem more consistent with yeast vaginitis.   She has external candidiasis noted.  Will treat with Diflucan presumptively for yeast vaginitis.  Will send a wet prep.  We will treat candidal intertrigo with ketoconazole.  Will send urine for culture prior to placing on antibiotics.   Orders Placed This Encounter  Procedures  . WET PREP FOR [place], YEAST, CLUE  . Urine Culture  . Urine Microscopic Only  . POCT Urinalysis Dipstick    Meds ordered this encounter  Medications  . fluconazole (DIFLUCAN) 150 MG tablet    Sig: Take 1 tablet (150 mg total) once for 1 dose by mouth.    Dispense:  1 tablet    Refill:  0  . ketoconazole (NIZORAL) 2 % cream    Sig: Apply 1 application daily topically.    Dispense:  15 g    Refill:  0   Tommi Rumps, MD [place]

## 2017-07-23 NOTE — Assessment & Plan Note (Signed)
Symptoms seem more consistent with yeast vaginitis.  She has external candidiasis noted.  Will treat with Diflucan presumptively for yeast vaginitis.  Will send a wet prep.  We will treat candidal intertrigo with ketoconazole.  Will send urine for culture prior to placing on antibiotics.

## 2017-07-23 NOTE — Patient Instructions (Signed)
Nice to see you. We will treat you for a vaginal yeast infection as well as candidal intertrigo. Please take the Diflucan. You can apply the cream topically to your groin. Please check with your pharmacy regarding Shingrix.

## 2017-07-24 ENCOUNTER — Ambulatory Visit: Payer: PPO | Admitting: Family Medicine

## 2017-07-24 LAB — CERVICOVAGINAL ANCILLARY ONLY: Wet Prep (BD Affirm): NEGATIVE

## 2017-07-25 LAB — URINE CULTURE
MICRO NUMBER: 81239914
SPECIMEN QUALITY:: ADEQUATE

## 2017-07-27 DIAGNOSIS — Z1231 Encounter for screening mammogram for malignant neoplasm of breast: Secondary | ICD-10-CM | POA: Diagnosis not present

## 2017-08-03 ENCOUNTER — Telehealth: Payer: Self-pay | Admitting: Family Medicine

## 2017-08-03 MED ORDER — FUROSEMIDE 20 MG PO TABS: ORAL_TABLET | ORAL | 1 refills | Status: DC

## 2017-08-03 NOTE — Telephone Encounter (Signed)
Copied from [place] [id number]. Topic: Quick Communication - See Telephone Encounter >> Aug 03, 2017 10:35 AM Robina Ade, Helene Kelp D wrote: CRM for notification. See Telephone encounter for: 08/03/17. Patient needs refill on her furosemide (LASIX) 20 MG tablet sen to her mail order [place], [place].

## 2017-08-07 ENCOUNTER — Telehealth: Payer: Self-pay | Admitting: Family Medicine

## 2017-08-07 NOTE — Telephone Encounter (Signed)
Copied from [place] [id number]. Topic: Quick Communication - See Telephone Encounter >> Aug 07, 2017 11:12 AM Patrice Paradise wrote: Cheri Guppy from [facility], phone number [phone] called with a question about the patient prescription for furosemide (LUSIX) 20 mg sent to them on 08/03/17. She need clarification on the dispense amount of 90 tablets and the daily dose take 10 mg daily. Arbie Cookey is requesting a call back for clarification. Arbie Cookey ask if you get her vm to please leave a detail msg as well as your full name this is a secure line.   CRM for notification. See Telephone encounter for: 08/07/17.

## 2017-08-07 NOTE — Telephone Encounter (Signed)
Please advise 

## 2017-08-07 NOTE — Telephone Encounter (Signed)
Tried to contact patient to discuss how she is taking Lasix- but did not get answer. No note in chart regarding patient taking 1/2 dose of prescribed medication.

## 2017-08-08 NOTE — Telephone Encounter (Signed)
Called patient and verified rx, she is taking half of a 20 mg of lasix. Left vm to notify Arbie Cookey from [facility]

## 2017-09-24 ENCOUNTER — Emergency Department: Payer: PPO

## 2017-09-24 ENCOUNTER — Emergency Department
Admission: EM | Admit: 2017-09-24 | Discharge: 2017-09-24 | Disposition: A | Discharge status: Home / Self Care | Payer: PPO | Attending: Emergency Medicine | Admitting: Emergency Medicine

## 2017-09-24 ENCOUNTER — Other Ambulatory Visit: Payer: Self-pay

## 2017-09-24 DIAGNOSIS — I10 Essential (primary) hypertension: Secondary | ICD-10-CM | POA: Diagnosis not present

## 2017-09-24 DIAGNOSIS — R0600 Dyspnea, unspecified: Secondary | ICD-10-CM

## 2017-09-24 DIAGNOSIS — R0602 Shortness of breath: Secondary | ICD-10-CM | POA: Diagnosis not present

## 2017-09-24 DIAGNOSIS — E039 Hypothyroidism, unspecified: Secondary | ICD-10-CM | POA: Insufficient documentation

## 2017-09-24 DIAGNOSIS — E119 Type 2 diabetes mellitus without complications: Secondary | ICD-10-CM | POA: Diagnosis not present

## 2017-09-24 LAB — URINALYSIS, COMPLETE (UACMP) WITH MICROSCOPIC
BILIRUBIN URINE: NEGATIVE
Bacteria, UA: NONE SEEN
GLUCOSE, UA: NEGATIVE mg/dL
Hgb urine dipstick: NEGATIVE
Ketones, ur: NEGATIVE mg/dL
Leukocytes, UA: NEGATIVE
NITRITE: NEGATIVE
PROTEIN: NEGATIVE mg/dL
SPECIFIC GRAVITY, URINE: 1.013 (ref 1.005–1.030)
pH: 7 (ref 5.0–8.0)

## 2017-09-24 LAB — BASIC METABOLIC PANEL
Anion gap: 7 (ref 5–15)
BUN: 13 mg/dL (ref 6–20)
CALCIUM: 8.9 mg/dL (ref 8.9–10.3)
CO2: 29 mmol/L (ref 22–32)
Chloride: 103 mmol/L (ref 101–111)
Creatinine, Ser: 0.88 mg/dL (ref 0.44–1.00)
GFR calc Af Amer: >60 mL/min (ref >60)
GFR, EST NON AFRICAN AMERICAN: 59 mL/min — AB (ref >60)
GLUCOSE: 172 mg/dL — AB (ref 65–99)
Potassium: 4.7 mmol/L (ref 3.5–5.1)
Sodium: 139 mmol/L (ref 135–145)

## 2017-09-24 LAB — CBC
HCT: 35.8 % (ref 35.0–47.0)
Hemoglobin: 11.7 g/dL — ABNORMAL LOW (ref 12.0–16.0)
MCH: 27.1 pg (ref 26.0–34.0)
MCHC: 32.7 g/dL (ref 32.0–36.0)
MCV: 83 fL (ref 80.0–100.0)
Platelets: 265 10*3/uL (ref 150–440)
RBC: 4.31 MIL/uL (ref 3.80–5.20)
RDW: 15.5 % — AB (ref 11.5–14.5)
WBC: 9.4 10*3/uL (ref 3.6–11.0)

## 2017-09-24 LAB — BRAIN NATRIURETIC PEPTIDE: B NATRIURETIC PEPTIDE 5: 268 pg/mL — AB (ref 0.0–100.0)

## 2017-09-24 LAB — TROPONIN I

## 2017-09-24 LAB — FIBRIN DERIVATIVES D-DIMER (ARMC ONLY): FIBRIN DERIVATIVES D-DIMER (ARMC): 1219.99 ng{FEU}/mL — AB (ref 0.00–499.00)

## 2017-09-24 MED ORDER — FUROSEMIDE 20 MG PO TABS: 20.0000 mg | ORAL_TABLET | Freq: Every day | ORAL | 0 refills | Status: DC

## 2017-09-24 MED ORDER — FUROSEMIDE 40 MG PO TABS
40.0000 mg | ORAL_TABLET | Freq: Once | ORAL | Status: AC
  Administered 2017-09-24: 40 mg via ORAL
  Filled 2017-09-24: qty 1

## 2017-09-24 MED ORDER — IOPAMIDOL (ISOVUE-370) INJECTION 76%
75.0000 mL | Freq: Once | INTRAVENOUS | Status: AC | PRN
  Administered 2017-09-24: 75 mL via INTRAVENOUS

## 2017-09-24 MED ORDER — FUROSEMIDE 20 MG PO TABS: 20.0000 mg | ORAL_TABLET | Freq: Two times a day (BID) | ORAL | 0 refills | Status: DC

## 2017-09-24 NOTE — ED Triage Notes (Signed)
Pt arrives to ED via [facility] from [place] with c/o [facility]. Per EMS, pt woke up at 415am today with shortness of breath and pt reports having cold s/x's around Christmas that have since improved. EMS reports lung sounds are clear in all fields. EMS states pt's O2 sats were 94-95% on RA, placed on 2L via [place] for comfort with O2 sats improving to 99%. Pt denies chest pain, no abdominal pain; pt denies fever, no N/V/D. Pt is A&O, in NAD; RR even, regular, and unlabored.

## 2017-09-24 NOTE — ED Notes (Signed)
Spoke with patients daughter and Chauncey Reading, Shirlean Mylar, states she will be able to transport patient back to facility, stated ETA approximately 20 minutes.

## 2017-09-24 NOTE — ED Provider Notes (Signed)
[facility] Emergency Department Provider Note       Time seen: ----------------------------------------- 7:38 AM on 09/24/2017 -----------------------------------------   I have reviewed the triage vital signs and the nursing notes.  HISTORY   Chief Complaint Shortness of Breath    HPI Diane Mullins is a 84 y.o. female with a history of diabetes, hyperlipidemia, hypertension who presents to the ED for shortness of breath that she states awoke her from sleep.  She states that at 415 today she was short of breath and she received oxygen in route by EMS which helped her symptoms.  Patient notes she was sick recently with cold-like symptoms but otherwise does not have any current symptoms.  She states she was not anxious when the event started.  Past Medical History:  Diagnosis Date  . Diabetes mellitus   . H/O: rheumatic fever   . Hyperlipidemia   . Hypertension   . Stroke [facility])    TIA Jan. 1st  . Thyroid disease     Patient Active Problem List   Diagnosis Date Noted  . Dysuria 07/10/2017  . Family history of colon cancer 04/17/2017  . Influenza 12/19/2016  . Acute bronchitis 07/18/2016  . Closed right ankle fracture 10/28/2015  . Fatigue 10/18/2015  . TIA (transient ischemic attack) 09/18/2015  . Orthostatic hypotension 08/02/2015  . Right shoulder injury 08/02/2015  . Adjustment disorder with anxious mood 03/30/2015  . Benign paroxysmal positional vertigo 07/17/2014  . Postmenopausal estrogen deficiency 06/03/2014  . DNR (do not resuscitate) discussion 03/04/2014  . Gait disturbance 05/07/2013  . Diabetes ([place]) 04/21/2013  . Tachycardia 01/23/2013  . Medicare annual wellness visit, subsequent 12/09/2012  . Exertional dyspnea 12/09/2012  . Hearing loss 12/09/2012  . Anxiety 01/31/2012  . Hypertension 12/22/2011  . Hyperlipidemia 12/22/2011  . Hypothyroidism 12/22/2011    Past Surgical History:  Procedure Laterality Date  . ABDOMINAL  HYSTERECTOMY  1973   menorrhagia  . HERNIA REPAIR  1994  . ORIF ANKLE FRACTURE Right 10/29/2015   Procedure: OPEN REDUCTION INTERNAL FIXATION (ORIF) ANKLE FRACTURE;  Surgeon: Thornton Park, MD;  Location: [facility] ORS;  Service: Orthopedics;  Laterality: Right;  Marland Kitchen VAGINAL DELIVERY     x4    Allergies Patient has no known allergies.  Social History Social History   Tobacco Use  . Smoking status: Never Smoker  . Smokeless tobacco: Never Used  Substance Use Topics  . Alcohol use: No  . Drug use: No    Review of Systems Constitutional: Negative for fever. Eyes: Negative for vision changes ENT: Positive for recent congestion Cardiovascular: Negative for chest pain. Respiratory: Positive for shortness of breath Gastrointestinal: Negative for abdominal pain, vomiting and diarrhea. Musculoskeletal: Negative for back pain. Skin: Negative for rash. Neurological: Negative for headaches, focal weakness or numbness.  All systems negative/normal/unremarkable except as stated in the HPI  ____________________________________________   PHYSICAL EXAM:  VITAL SIGNS: ED Triage Vitals  Enc Vitals Group     BP --      Pulse Rate 09/24/17 0638 71     Resp 09/24/17 0638 18     Temp 09/24/17 0638 97.7 F (36.5 C)     Temp Source 09/24/17 0638 Oral     SpO2 09/24/17 0638 95 %     Weight 09/24/17 0639 210 lb (95.3 kg)     Height 09/24/17 0639 5\' 2"  (1.575 m)     Head Circumference --      Peak Flow --  Pain Score 09/24/17 0637 0     Pain Loc --      Pain Edu? --      Excl. in [place]? --    Constitutional: Alert and oriented. Well appearing and in no distress. Eyes: Conjunctivae are normal. Normal extraocular movements. ENT   Head: Normocephalic and atraumatic.   Nose: No congestion/rhinnorhea.   Mouth/Throat: Mucous membranes are moist.   Neck: No stridor. Cardiovascular: Normal rate, regular rhythm. No murmurs, rubs, or gallops. Respiratory: Normal respiratory  effort without tachypnea nor retractions. Breath sounds are clear and equal bilaterally. No wheezes/rales/rhonchi. Gastrointestinal: Soft and nontender. Normal bowel sounds Musculoskeletal: Nontender with normal range of motion in extremities. No lower extremity tenderness nor edema. Neurologic:  Normal speech and language. No gross focal neurologic deficits are appreciated.  Skin:  Skin is warm, dry and intact. No rash noted. Psychiatric: Mood and affect are normal. Speech and behavior are normal.  ____________________________________________  EKG: Interpreted by me.  Sinus rhythm with a rate of 68 bpm, possible LVH, leftward axis, no evidence of acute infarction  ____________________________________________  ED COURSE:  As part of my medical decision making, I reviewed the following data within the [place] History obtained from family if available, nursing notes, old chart and ekg, as well as notes from prior ED visits. Patient presented for shortness of breath, we will assess with labs and imaging as indicated at this time.   Procedures ____________________________________________   LABS (pertinent positives/negatives)  Labs Reviewed  BASIC METABOLIC PANEL - Abnormal; Notable for the following components:      Result Value   Glucose, Bld 172 (*)    GFR calc non Af Amer 59 (*)    All other components within normal limits  CBC - Abnormal; Notable for the following components:   Hemoglobin 11.7 (*)    RDW 15.5 (*)    All other components within normal limits  FIBRIN DERIVATIVES D-DIMER ([facility] ONLY) - Abnormal; Notable for the following components:   Fibrin derivatives D-dimer ([facility]) 1,219.99 (*)    All other components within normal limits  BRAIN NATRIURETIC PEPTIDE - Abnormal; Notable for the following components:   B Natriuretic Peptide 268.0 (*)    All other components within normal limits  TROPONIN I    RADIOLOGY Images were viewed by me  Chest  x-ray IMPRESSION: Cardiomegaly with vascular congestion and pulmonary interstitial Edema. IMPRESSION: No evidence of pulmonary emboli.  Tiny bilateral pleural effusions with mild bibasilar atelectatic changes. No focal infiltrate is seen.  Cholelithiasis without complicating factors.  Aortic Atherosclerosis (ICD10-I70.0). ____________________________________________  DIFFERENTIAL DIAGNOSIS   Anxiety, pneumothorax, PE, pneumonia, CHF  FINAL ASSESSMENT AND PLAN  Dyspnea   Plan: Patient had presented for dyspnea. Patient's labs revealed a mildly elevated BNP and an elevated d-dimer. Patient's imaging did not reveal any PE but there were small pleural effusions present.  Unclear etiology for her symptoms.  I think I will double her Lasix dose for several days and she was given an extra 40 mg dose here.  Overall she is stable for outpatient follow-up.   Earleen Newport, MD   Note: This note was generated in part or whole with voice recognition software. Voice recognition is usually quite accurate but there are transcription errors that can and very often do occur. I apologize for any typographical errors that were not detected and corrected.     Earleen Newport, MD 09/24/17 [phone]

## 2017-09-24 NOTE — ED Notes (Signed)
Lab notified to add on D-Dimer.

## 2017-09-24 NOTE — ED Notes (Signed)
Patient transported to CT 

## 2017-09-25 ENCOUNTER — Encounter: Payer: Self-pay | Admitting: Family Medicine

## 2017-09-25 ENCOUNTER — Ambulatory Visit (INDEPENDENT_AMBULATORY_CARE_PROVIDER_SITE_OTHER): Payer: PPO | Admitting: Family Medicine

## 2017-09-25 VITALS — BP 114/64 | HR 77 | Temp 97.8°F | Wt 213.2 lb

## 2017-09-25 DIAGNOSIS — R0600 Dyspnea, unspecified: Secondary | ICD-10-CM

## 2017-09-25 DIAGNOSIS — Z794 Long term (current) use of insulin: Secondary | ICD-10-CM

## 2017-09-25 DIAGNOSIS — E119 Type 2 diabetes mellitus without complications: Secondary | ICD-10-CM | POA: Diagnosis not present

## 2017-09-25 DIAGNOSIS — I10 Essential (primary) hypertension: Secondary | ICD-10-CM

## 2017-09-25 DIAGNOSIS — I5030 Unspecified diastolic (congestive) heart failure: Secondary | ICD-10-CM

## 2017-09-25 LAB — URINE CULTURE

## 2017-09-25 MED ORDER — POTASSIUM CHLORIDE CRYS ER 10 MEQ PO TBCR: 10.0000 meq | EXTENDED_RELEASE_TABLET | Freq: Every day | ORAL | 0 refills | Status: DC

## 2017-09-25 NOTE — Progress Notes (Signed)
Tommi Rumps, MD Phone: [phone]  Diane Mullins is a 84 y.o. female who presents today for follow-up.  Patient was seen yesterday in the emergency room.  She woke up at 3 in the morning feeling dyspneic.  She had gone to the bathroom and got back to bed and felt as though she could not breathe.  She contacted EMS and they placed her on oxygen.  She reports she did not have chest pain or palpitations.  She had some slight cold symptoms on Christmas day though got to feeling better.  She has been having to sleep on more pillows.  She underwent evaluation of chest x-ray that revealed cardiomegaly with vascular congestion and pulmonary interstitial edema.  She underwent CT angiogram of her chest with no findings for pulmonary embolus.  There were tiny bilateral pleural effusions with mild bibasilar atelectatic changes.  No focal infiltrate.  Cholelithiasis was seen as well.  She was given 40 mg of Lasix in the ED.  She was advised to take 20 mg twice daily of Lasix daily for 5 days.  She notes she does feel better and has not had recurrence of symptoms.  She is still sleeping on 2-1/2 pillows.  She is taking her insulin daily.  Her metformin has been on hold for several days given that she had a CT angiogram.  It was 206 today.  Has been running in the 80s and 90s  Social History   Tobacco Use  Smoking Status Never Smoker  Smokeless Tobacco Never Used     ROS see history of present illness  Objective  Physical Exam Vitals:   09/25/17 1139  BP: 114/64  Pulse: 77  Temp: 97.8 F (36.6 C)  SpO2: 92%    BP Readings from Last 3 Encounters:  09/25/17 114/64  09/24/17 (!) 151/65  07/23/17 140/70   Wt Readings from Last 3 Encounters:  09/25/17 213 lb 3.2 oz (96.7 kg)  09/24/17 210 lb (95.3 kg)  07/23/17 214 lb (97.1 kg)    Physical Exam  Constitutional: No distress.  Cardiovascular: Normal rate, regular rhythm and normal heart sounds.  Pulmonary/Chest: Effort normal and  breath sounds normal.  Musculoskeletal: She exhibits edema (Trace pitting edema bilateral lower extremities near ankles).  Neurological: She is alert. Gait normal.  Skin: Skin is warm and dry. She is not diaphoretic.     Assessment/Plan: Please see individual problem list.  Diabetes ([place]) Seems to have been fairly well controlled recently.  She will resume metformin as she was advised from the emergency department.  Hypertension Well-controlled at this time.  She will monitor for lightheadedness given increased Lasix dose.  (HFpEF) heart failure with preserved ejection fraction (HCC) Symptoms have improved.  Grade 1 diastolic dysfunction noted on most recent echo from 2017.  Symptoms concerning for CHF exacerbation.  She will complete a total of 5 days of 20 mg twice daily of Lasix.  We will place her on potassium supplementation with this.  She will then decrease back to her 10 mg daily.  We will get her set up for an echo.  We will try to get her set up with cardiology.  She will return for a BMP on Thursday.  Return precautions given.   Geana was seen today for follow-up.  Diagnoses and all orders for this visit:  Type 2 diabetes mellitus without complication, with long-term current use of insulin (HCC) -     Hemoglobin A1c; Future  Essential hypertension -  Basic Metabolic Panel (BMET); Future  Dyspnea, unspecified type -     Ambulatory referral to Cardiology  (HFpEF) heart failure with preserved ejection fraction ([place])  Other orders -     potassium chloride SA (K-DUR,KLOR-CON) 10 MEQ tablet; Take 1 tablet (10 mEq total) by mouth daily.    Orders Placed This Encounter  Procedures  . Basic Metabolic Panel (BMET)    Standing Status:   Future    Number of Occurrences:   1    Standing Expiration Date:   09/25/2018  . Hemoglobin A1c    Standing Status:   Future    Number of Occurrences:   1    Standing Expiration Date:   09/25/2018  . Ambulatory referral to Cardiology      Referral Priority:   Routine    Referral Type:   Consultation    Referral Reason:   Specialty Services Required    Requested Specialty:   Cardiology    Number of Visits Requested:   1    Meds ordered this encounter  Medications  . potassium chloride SA (K-DUR,KLOR-CON) 10 MEQ tablet    Sig: Take 1 tablet (10 mEq total) by mouth daily.    Dispense:  2 tablet    Refill:  0     Tommi Rumps, MD [place]

## 2017-09-25 NOTE — Patient Instructions (Addendum)
Nice to see you. We will have you return on Thursday for lab work. We will try to get you into see cardiology. We will get an echo as well. You will take Lasix 20 mg twice daily for the next 2 days.  You will take potassium with your first dose of Lasix. If you have worsening shortness of breath or any other symptoms please be evaluated.

## 2017-09-27 ENCOUNTER — Other Ambulatory Visit (INDEPENDENT_AMBULATORY_CARE_PROVIDER_SITE_OTHER): Payer: PPO

## 2017-09-27 DIAGNOSIS — Z794 Long term (current) use of insulin: Secondary | ICD-10-CM

## 2017-09-27 DIAGNOSIS — I10 Essential (primary) hypertension: Secondary | ICD-10-CM

## 2017-09-27 DIAGNOSIS — E119 Type 2 diabetes mellitus without complications: Secondary | ICD-10-CM

## 2017-09-27 LAB — HEMOGLOBIN A1C: Hgb A1c MFr Bld: 9.1 % — ABNORMAL HIGH (ref 4.6–6.5)

## 2017-09-27 LAB — BASIC METABOLIC PANEL
BUN: 18 mg/dL (ref 6–23)
CO2: 30 mEq/L (ref 19–32)
Calcium: 9.1 mg/dL (ref 8.4–10.5)
Chloride: 101 mEq/L (ref 96–112)
Creatinine, Ser: 1.08 mg/dL (ref 0.40–1.20)
GFR: 51.49 mL/min — AB (ref >60.00)
GLUCOSE: 138 mg/dL — AB (ref 70–99)
Potassium: 4.1 mEq/L (ref 3.5–5.1)
Sodium: 139 mEq/L (ref 135–145)

## 2017-09-28 DIAGNOSIS — I5030 Unspecified diastolic (congestive) heart failure: Secondary | ICD-10-CM | POA: Insufficient documentation

## 2017-09-28 NOTE — Assessment & Plan Note (Signed)
Seems to have been fairly well controlled recently.  She will resume metformin as she was advised from the emergency department.

## 2017-09-28 NOTE — Assessment & Plan Note (Signed)
Well-controlled at this time.  She will monitor for lightheadedness given increased Lasix dose.

## 2017-09-28 NOTE — Assessment & Plan Note (Addendum)
Symptoms have improved.  Grade 1 diastolic dysfunction noted on most recent echo from 2017.  Symptoms concerning for CHF exacerbation.  She will complete a total of 5 days of 20 mg twice daily of Lasix.  We will place her on potassium supplementation with this.  She will then decrease back to her 10 mg daily.  We will get her set up for an echo.  We will try to get her set up with cardiology.  She will return for a BMP on Thursday.  Return precautions given.

## 2017-09-29 ENCOUNTER — Other Ambulatory Visit: Payer: Self-pay | Admitting: Family Medicine

## 2017-09-29 DIAGNOSIS — N179 Acute kidney failure, unspecified: Secondary | ICD-10-CM

## 2017-09-29 NOTE — Progress Notes (Signed)
bm 

## 2017-10-09 ENCOUNTER — Telehealth: Payer: Self-pay | Admitting: Family Medicine

## 2017-10-09 ENCOUNTER — Other Ambulatory Visit (INDEPENDENT_AMBULATORY_CARE_PROVIDER_SITE_OTHER): Payer: PPO

## 2017-10-09 DIAGNOSIS — N179 Acute kidney failure, unspecified: Secondary | ICD-10-CM | POA: Diagnosis not present

## 2017-10-09 LAB — BASIC METABOLIC PANEL
BUN: 12 mg/dL (ref 6–23)
CALCIUM: 9 mg/dL (ref 8.4–10.5)
CO2: 32 meq/L (ref 19–32)
CREATININE: 0.96 mg/dL (ref 0.40–1.20)
Chloride: 101 mEq/L (ref 96–112)
GFR: 58.99 mL/min — AB (ref >60.00)
GLUCOSE: 208 mg/dL — AB (ref 70–99)
Potassium: 4.7 mEq/L (ref 3.5–5.1)
Sodium: 138 mEq/L (ref 135–145)

## 2017-10-09 NOTE — Telephone Encounter (Signed)
Pt dropped off a letter size envelope with her BP numbers. Envelope is up front in Dr. Evans Lance folder.

## 2017-10-09 NOTE — Telephone Encounter (Signed)
Placed on DR.Sonnenbergs desk 

## 2017-10-10 NOTE — Telephone Encounter (Signed)
This is for blood sugars.  Her fasting blood sugars appear to be well controlled ranging between 82 and 134 over the last week.  Please see if she can check other times during the day such as 2 hours before lunch and 2 hours before dinner as well as at bedtime and alternate those over the next week.  She should let us know what they have been running in a week.  I would like to know these numbers so I can figure out what to do with her regimen as the blood sugars that she brought in do not match what her A1c has been.  Thanks.

## 2017-10-10 NOTE — Telephone Encounter (Signed)
Left message to return call, ok for pec to speak with patient DR.Sonnenberg: This is for blood sugars.  Her fasting blood sugars appear to be well controlled ranging between 82 and 134 over the last week.  Please see if she can check other times during the day such as 2 hours before lunch and 2 hours before dinner as well as at bedtime and alternate those over the next week.  She should let us know what they have been running in a week.  I would like to know these numbers so I can figure out what to do with her regimen as the blood sugars that she brought in do not match what her A1c has been.  Thanks.

## 2017-10-24 NOTE — Telephone Encounter (Signed)
Patient states she will try to check it before lunch and dinner

## 2017-10-30 ENCOUNTER — Encounter: Payer: Self-pay | Admitting: Family Medicine

## 2017-10-30 ENCOUNTER — Ambulatory Visit (INDEPENDENT_AMBULATORY_CARE_PROVIDER_SITE_OTHER): Payer: PPO | Admitting: Family Medicine

## 2017-10-30 ENCOUNTER — Other Ambulatory Visit: Payer: Self-pay

## 2017-10-30 DIAGNOSIS — I5030 Unspecified diastolic (congestive) heart failure: Secondary | ICD-10-CM | POA: Diagnosis not present

## 2017-10-30 DIAGNOSIS — E119 Type 2 diabetes mellitus without complications: Secondary | ICD-10-CM | POA: Diagnosis not present

## 2017-10-30 DIAGNOSIS — Z794 Long term (current) use of insulin: Secondary | ICD-10-CM

## 2017-10-30 DIAGNOSIS — Z6838 Body mass index (BMI) 38.0-38.9, adult: Secondary | ICD-10-CM | POA: Diagnosis not present

## 2017-10-30 NOTE — Assessment & Plan Note (Signed)
Symptoms have remained improved.  Very minimal dyspnea at times.  She will see cardiology as planned.  Continue 10 mg of Lasix.  Given return precautions.

## 2017-10-30 NOTE — Progress Notes (Signed)
  Tommi Rumps, MD Phone: [phone]  Diane Mullins is a 84 y.o. female who presents today for follow-up.  Patient previously seen for dyspnea after an ED visit.  Notes rarely getting dyspnea now only if she does too much activity.  No chest pain.  No orthopnea or PND.  She responded well to the increased dose of Lasix previously.  No swelling.  Diabetes: She increased her insulin to 17 units and has been taking her metformin.  Notes her CBGs have been ranging from 79 to the 110s fasting.  She has not checked at other times.  No hypoglycemic symptoms.  No polyuria or polydipsia.  She is trying to work on her diet.  She is eating salad and cottage cheese daily.  Trying to eat a heart healthy meal at her living facility.  Does exercise 2 times per week.  Social History   Tobacco Use  Smoking Status Never Smoker  Smokeless Tobacco Never Used     ROS see history of present illness  Objective  Physical Exam Vitals:   10/30/17 1505  BP: (!) 144/68  Pulse: 66  Temp: 98 F (36.7 C)  SpO2: 95%    BP Readings from Last 3 Encounters:  10/30/17 (!) 144/68  09/25/17 114/64  09/24/17 (!) 151/65   Wt Readings from Last 3 Encounters:  10/30/17 219 lb 12.8 oz (99.7 kg)  09/25/17 213 lb 3.2 oz (96.7 kg)  09/24/17 210 lb (95.3 kg)    Physical Exam  Constitutional: No distress.  Cardiovascular: Normal rate, regular rhythm and normal heart sounds.  Pulmonary/Chest: Effort normal and breath sounds normal.  Musculoskeletal: She exhibits no edema.  Neurological: She is alert. Gait normal.  Skin: Skin is warm and dry. She is not diaphoretic.     Assessment/Plan: Please see individual problem list.  (HFpEF) heart failure with preserved ejection fraction (HCC) Symptoms have remained improved.  Very minimal dyspnea at times.  She will see cardiology as planned.  Continue 10 mg of Lasix.  Given return precautions.  Diabetes ([place]) CBGs have improved significantly.  Discussed  varying the time of the day that she checks them to provide [place] with a little more information given the disconnect between her CBGs and A1c.  She will continue her current medications.  Obesity Encouraged continued healthy diet and staying active.   No orders of the defined types were placed in this encounter.   No orders of the defined types were placed in this encounter.    Tommi Rumps, MD [place]

## 2017-10-30 NOTE — Assessment & Plan Note (Signed)
Encouraged continued healthy diet and staying active.

## 2017-10-30 NOTE — Patient Instructions (Signed)
Nice to see you. Please see the cardiologist as planned. Please continue to monitor your blood sugars.  You can vary the time of the day that you check them.

## 2017-10-30 NOTE — Assessment & Plan Note (Signed)
CBGs have improved significantly.  Discussed varying the time of the day that she checks them to provide [place] with a little more information given the disconnect between her CBGs and A1c.  She will continue her current medications.

## 2017-11-07 ENCOUNTER — Other Ambulatory Visit: Payer: Self-pay | Admitting: Family Medicine

## 2017-11-07 DIAGNOSIS — Z760 Encounter for issue of repeat prescription: Secondary | ICD-10-CM

## 2017-11-09 ENCOUNTER — Telehealth: Payer: Self-pay | Admitting: Family Medicine

## 2017-11-09 MED ORDER — SIMVASTATIN 40 MG PO TABS: 40.0000 mg | ORAL_TABLET | Freq: Every evening | ORAL | 0 refills | Status: DC

## 2017-11-09 MED ORDER — INSULIN GLARGINE 100 UNIT/ML SOLOSTAR PEN: PEN_INJECTOR | SUBCUTANEOUS | 6 refills | Status: DC

## 2017-11-09 NOTE — Telephone Encounter (Signed)
Attempted to call the pt's home phone but no answer at this time. Called pt's cell phone and left message for the pt to return call to the office regarding refills.

## 2017-11-09 NOTE — Telephone Encounter (Signed)
Copied from [place] [id number]. Topic: Quick Communication - Rx Refill/Question >> Nov 09, 2017 11:40 AM Ether Griffins B wrote: Medication: simvastatin and Insulin Glargine [facility] it needs to go to [place], [place]    Preferred Pharmacy (with phone number or street name): [facility] [place], [place]   Agent: Please be advised that RX refills may take up to 3 business days. We ask that you follow-up with your pharmacy.

## 2017-11-15 NOTE — Progress Notes (Signed)
Cardiology Office Note  Date:  11/16/2017   ID:  EMIRETH COCKERHAM, DOB [birth year], MRN [id number]  PCP:  [name], MD   Chief Complaint  Patient presents with  . OTHER    SOB. Meds reviewed verbally with pt.    HPI:  84 year-old female with history of  diabetes,  obesity,   Hypertension, hyperlipidemia,   gait instability  tachycardia  who presents for routine followup of her tachycardia, shortness of breath and weakness  She reports doing well in general, no regular physical activity Weight continues to trend upwards  Episode of significant SOB 09/24/2017 Woke up middle of the night , 2 or 3 in the morning,  Went to the emergency room for further evaluation  CT chest for elevated d-dimer BNP 250 Was told she might have extra fluid  Reports that she takes Lasix 10 mg daily, sometimes misses days  Eats out periodically High fluids   denies excessive lower extremity edema, sometimes mild ankle swelling Sits around much of the day  CT scan images pulled up in the office and reviewed with her in detail showing heavy coronary calcification, LAD, RCA  mild aortic arch atherosclerosis  Chronic leg weakness Difficult time controlling her sugars. Some dietary noncompliance  chronic shortness of breath with exertion, walks with a walker  Previous echocardiogram was essentially normal with ejection fraction greater than 55%.  EKG shows sinus rhythm with rate 71 beats per minute beats per minute, nonspecific ST and T wave abnormality, left anterior fascicular block No significant change compared to EKG 2015   PMH:   has a past medical history of Diabetes mellitus, H/O: rheumatic fever, Hyperlipidemia, Hypertension, Stroke ([place]), and Thyroid disease.  PSH:    Past Surgical History:  Procedure Laterality Date  . ABDOMINAL HYSTERECTOMY  1973   menorrhagia  . HERNIA REPAIR  1994  . ORIF ANKLE FRACTURE Right 10/29/2015   Procedure: OPEN REDUCTION INTERNAL FIXATION  (ORIF) ANKLE FRACTURE;  Surgeon: Thornton Park, MD;  Location: [facility] ORS;  Service: Orthopedics;  Laterality: Right;  Marland Kitchen VAGINAL DELIVERY     x4    Current Outpatient Medications  Medication Sig Dispense Refill  . aspirin EC 81 MG tablet Take 81 mg by mouth daily.    . furosemide (LASIX) 20 MG tablet Take 10 mg by mouth daily.    Marland Kitchen glimepiride (AMARYL) 4 MG tablet Take 1 tablet by mouth every morning and 1/2 tablet every evening 135 tablet 0  . glucose blood (ONE TOUCH ULTRA TEST) test strip Use as instructed to check Blood sugar three times a day.  E11.65 Dispense patient choice. 300 each 3  . Insulin Glargine (LANTUS SOLOSTAR) 100 UNIT/ML Solostar Pen INJECT  15 UNITS SUBCUTANEOUSLY IN THE MORNING (Patient taking differently: INJECT  17 UNITS SUBCUTANEOUSLY IN THE MORNING) 15 mL 6  . Insulin Pen Needle (PEN NEEDLES) 32G X 4 MM MISC Inject 1 Dose into the skin daily. 90 each 3  . ketoconazole (NIZORAL) 2 % cream Apply 1 application daily topically. 15 g 0  . levothyroxine (SYNTHROID, LEVOTHROID) 75 MCG tablet Take 1 tablet (75 mcg total) by mouth daily. 90 tablet 3  . losartan (COZAAR) 50 MG tablet Take 1 tablet (50 mg total) by mouth daily. 90 tablet 3  . metFORMIN (GLUCOPHAGE) 1000 MG tablet Take 1 tablet (1,000 mg total) by mouth 2 (two) times daily with a meal. TAKE 1 TABLET TWICE DAILY  WITH  A  MEAL 180 tablet 3  .  metoprolol succinate (TOPROL-XL) 100 MG 24 hr tablet Take 1 tablet (100 mg total) by mouth daily. 90 tablet 3  . omeprazole (PRILOSEC) 20 MG capsule Take 1 capsule (20 mg total) by mouth daily. 90 capsule 3  . potassium chloride SA (K-DUR,KLOR-CON) 10 MEQ tablet Take 1 tablet (10 mEq total) by mouth daily. 2 tablet 0  . simvastatin (ZOCOR) 40 MG tablet Take 1 tablet (40 mg total) by mouth every evening. 90 tablet 0   No current facility-administered medications for this visit.      Allergies:   Oxycodone   Social History:  The patient  reports that  has never smoked. she  has never used smokeless tobacco. She reports that she does not drink alcohol or use drugs.   Family History:   family history includes Cancer in her brother, daughter, and father; Diabetes in her brother and sister; Heart attack (age of onset: 28) in her mother; Heart disease (age of onset: 64) in her mother; Hypertension in her sister; Non-Hodgkin's lymphoma in her son.    Review of Systems: Review of Systems  Constitutional: Negative.   Respiratory: Negative.   Cardiovascular: Negative.   Gastrointestinal: Negative.   Musculoskeletal: Negative.   Neurological: Negative.   Psychiatric/Behavioral: Negative.   All other systems reviewed and are negative.    PHYSICAL EXAM: VS:  BP (!) 144/82 (BP Location: Right Arm, Patient Position: Sitting, Cuff Size: Large)   Pulse 71   Ht 5\' 2"  (1.575 m)   Wt 215 lb (97.5 kg)   BMI 39.32 kg/m  , BMI Body mass index is 39.32 kg/m. GEN: Well nourished, well developed, in no acute distress Obese HEENT: normal  Neck: no JVD, carotid bruits, or masses Cardiac: RRR; no murmurs, rubs, or gallops, trace nonpitting lower extremity ankle edema  Respiratory:  clear to auscultation bilaterally, normal work of breathing GI: soft, nontender, nondistended, + BS MS: no deformity or atrophy  Skin: warm and dry, no rash Neuro:  Strength and sensation are intact Psych: euthymic mood, full affect   Recent Labs: 12/19/2016: TSH 2.25 12/26/2016: Magnesium 1.5 07/23/2017: ALT 11 09/24/2017: B Natriuretic Peptide 268.0; Hemoglobin 11.7; Platelets 265 10/09/2017: BUN 12; Creatinine, Ser 0.96; Potassium 4.7; Sodium 138    Lipid Panel Lab Results  Component Value Date   CHOL 170 05/10/2017   HDL 45.10 05/10/2017   LDLCALC 90 05/10/2017   TRIG 176.0 (H) 05/10/2017      Wt Readings from Last 3 Encounters:  11/16/17 215 lb (97.5 kg)  10/30/17 219 lb 12.8 oz (99.7 kg)  09/25/17 213 lb 3.2 oz (96.7 kg)       ASSESSMENT AND PLAN:  Paroxysmal tachycardia  (HCC) - Plan: EKG 12-Lead Denies having any tachycardia symptoms Rate well controlled on today's visit on metoprolol  Essential hypertension - Plan: EKG 12-Lead Blood pressure borderline elevated, will watch for now  Mixed hyperlipidemia - Plan: EKG 12-Lead Goal LDL less than 70 May need to add Zetia to reach goal  Shortness of breath - Plan: NM Myocar Multi W/Spect W/Wall Motion / EF Etiology unclear, Stress test ordered for significant coronary calcification seen on CT scan  (HFpEF) heart failure with preserved ejection fraction ([place]) Long discussion concerning her Lasix dosing, fluid intake, salt intake She does eat out periodically, high fluid intake Suggested she could even try Lasix 20 mg one day, 10 the next day BNP was not very high, trace pleural effusions Unable to exclude sleep apnea Symptoms exacerbated from her morbid obesity  Cad/coronary calcification seen on CT scan Heavy calcification noted, images reviewed with her in detail Recommended stress test to rule out ischemia  Type 2 diabetes Long discussion with her, currently poorly controlled, poor diet, weight running high  Long discussion concerning following a low carbohydrate diet, increasing her walking   Disposition:   F/U  12 months  Long discussion with patient and daughter concerning above  Total encounter time more than 60 minutes  Greater than 50% was spent in counseling and coordination of care with the patient    Orders Placed This Encounter  Procedures  . NM Myocar Multi W/Spect W/Wall Motion / EF  . EKG 12-Lead     Signed, Esmond Plants, M.D., Ph.D. 11/16/2017  [place], [place]

## 2017-11-16 ENCOUNTER — Ambulatory Visit (INDEPENDENT_AMBULATORY_CARE_PROVIDER_SITE_OTHER): Payer: PPO | Admitting: Cardiovascular Disease

## 2017-11-16 ENCOUNTER — Encounter: Payer: Self-pay | Admitting: Cardiovascular Disease

## 2017-11-16 VITALS — BP 144/82 | HR 71 | Ht 62.0 in | Wt 215.0 lb

## 2017-11-16 DIAGNOSIS — R0602 Shortness of breath: Secondary | ICD-10-CM | POA: Diagnosis not present

## 2017-11-16 DIAGNOSIS — E782 Mixed hyperlipidemia: Secondary | ICD-10-CM | POA: Diagnosis not present

## 2017-11-16 DIAGNOSIS — I479 Paroxysmal tachycardia, unspecified: Secondary | ICD-10-CM | POA: Diagnosis not present

## 2017-11-16 DIAGNOSIS — I5030 Unspecified diastolic (congestive) heart failure: Secondary | ICD-10-CM | POA: Diagnosis not present

## 2017-11-16 DIAGNOSIS — I2510 Atherosclerotic heart disease of native coronary artery without angina pectoris: Secondary | ICD-10-CM

## 2017-11-16 DIAGNOSIS — E1159 Type 2 diabetes mellitus with other circulatory complications: Secondary | ICD-10-CM

## 2017-11-16 DIAGNOSIS — I10 Essential (primary) hypertension: Secondary | ICD-10-CM | POA: Diagnosis not present

## 2017-11-16 DIAGNOSIS — I4892 Unspecified atrial flutter: Secondary | ICD-10-CM | POA: Insufficient documentation

## 2017-11-16 DIAGNOSIS — Z794 Long term (current) use of insulin: Secondary | ICD-10-CM

## 2017-11-16 NOTE — Patient Instructions (Addendum)
Medication Instructions:   No medication changes made  Labwork:  No new labs needed  Testing/Procedures:  We will order a lexiscan myoview for shortness of breath, coronary disease on CT scan, unable to treadmill  No food the morning of the test Hold the insulin , lasix and metoprolol the morning of the test  [place]  Your caregiver has ordered a Stress Test with nuclear imaging. The purpose of this test is to evaluate the blood supply to your heart muscle. This procedure is referred to as a "Non-Invasive Stress Test." This is because other than having an IV started in your vein, nothing is inserted or "invades" your body. Cardiac stress tests are done to find areas of poor blood flow to the heart by determining the extent of coronary artery disease (CAD). Some patients exercise on a treadmill, which naturally increases the blood flow to your heart, while others who are  unable to walk on a treadmill due to physical limitations have a pharmacologic/chemical stress agent called Lexiscan . This medicine will mimic walking on a treadmill by temporarily increasing your coronary blood flow.   Please note: these test may take anywhere between 2-4 hours to complete  PLEASE REPORT TO [place] AT THE FIRST DESK WILL DIRECT YOU WHERE TO GO  Date of Procedure:_____________________________________  Arrival Time for Procedure:______________________________  Instructions regarding medication:   _x___ : Hold diabetes medication morning of procedure (insulin)   _x___:  Hold betablocker(s) night before procedure and morning of procedure (metoprolol)  _x___:  Hold other medications as follows: lasix (furosemide)   PLEASE NOTIFY THE OFFICE AT LEAST 24 HOURS IN ADVANCE IF YOU ARE UNABLE TO KEEP YOUR APPOINTMENT.  [phone] AND  PLEASE NOTIFY NUCLEAR MEDICINE AT [facility] AT LEAST 24 HOURS IN ADVANCE IF YOU ARE UNABLE TO KEEP YOUR APPOINTMENT. [phone]  How  to prepare for your Myoview test:  1. Do not eat or drink after midnight 2. No caffeine for 24 hours prior to test 3. No smoking 24 hours prior to test. 4. Your medication may be taken with water.  If your doctor stopped a medication because of this test, do not take that medication. 5. Ladies, please do not wear dresses.  Skirts or pants are appropriate. Please wear a short sleeve shirt. 6. No perfume, cologne or lotion. 7. Wear comfortable walking shoes. No heels!    Follow-Up: It was a pleasure seeing you in the office today. Please call us if you have new issues that need to be addressed before your next appt.  [phone]  Your physician wants you to follow-up in:  1 year with Dr. Rockey Situ  If you need a refill on your cardiac medications before your next appointment, please call your pharmacy.  For educational health videos Log in to : [email or web address] Or : [email or web address], password : [email or web address]

## 2017-11-20 ENCOUNTER — Telehealth: Payer: Self-pay | Admitting: Family Medicine

## 2017-11-20 NOTE — Telephone Encounter (Signed)
Copied from [place] [id number]. Topic: Quick Communication - See Telephone Encounter >> Nov 20, 2017  1:33 PM Vernona Rieger wrote: CRM for notification. See Telephone encounter for:   11/20/17.  [facility] and wants to make sure the script simvastatin (ZOCOR) 40 MG tablet Dosage increase is accurate. She stated it was for 20mg , so she wants to make sure this is correct before they fill it. Call back [phone] a message can be left with nurse's first and last name

## 2017-11-21 NOTE — Telephone Encounter (Addendum)
[place] called and spoke to [employer]. She wanted clarification on the dosage of Simvastatin for the patient. She said "we had a 20 mg daily prescription on file and the patient called to request a refill, so this was sent out on 11/04/17, 90 tabs. Then on 11/09/17 we received a prescription for 40 mg daily 90 tab/0 refills. I was just clarifying which one the patient is supposed to be taking." I informed her that according to documentation, the 20 mg dosage was discontinued on 06/12/17 and the 40 mg dosage was started with a new prescription sent to [facility] 90 tabs/0 refills. I advised I will contact the patient to inform her of the correct dosage and to clarify the dosage she is taking, she verbalized understanding.

## 2017-11-21 NOTE — Telephone Encounter (Signed)
Patient was called to home and cell numbers listed, left VM for the patient to call back with the correct dosage of simvastatin she's taking and to inform her that her correct dosage is 40 mg daily.

## 2017-11-30 ENCOUNTER — Encounter
Admission: RE | Admit: 2017-11-30 | Discharge: 2017-11-30 | Disposition: A | Discharge status: Home / Self Care | Payer: PPO | Source: Ambulatory Visit | Attending: Cardiovascular Disease | Admitting: Cardiovascular Disease

## 2017-11-30 DIAGNOSIS — R0602 Shortness of breath: Secondary | ICD-10-CM | POA: Insufficient documentation

## 2017-11-30 LAB — NM MYOCAR MULTI W/SPECT W/WALL MOTION / EF
CHL CUP NUCLEAR SDS: 4
CHL CUP NUCLEAR SRS: 11
CHL CUP NUCLEAR SSS: 11
LV dias vol: 80 mL (ref 46–106)
LV sys vol: 29 mL
Peak HR: 105 {beats}/min
Percent HR: 76 %
Rest HR: 71 {beats}/min
TID: 1.3

## 2017-11-30 MED ORDER — TECHNETIUM TC 99M TETROFOSMIN IV KIT
32.6800 | PACK | Freq: Once | INTRAVENOUS | Status: AC | PRN
  Administered 2017-11-30: 32.68 via INTRAVENOUS

## 2017-11-30 MED ORDER — REGADENOSON 0.4 MG/5ML IV SOLN
0.4000 mg | Freq: Once | INTRAVENOUS | Status: AC
  Administered 2017-11-30: 0.4 mg via INTRAVENOUS

## 2017-11-30 MED ORDER — TECHNETIUM TC 99M TETROFOSMIN IV KIT
10.0000 | PACK | Freq: Once | INTRAVENOUS | Status: AC | PRN
  Administered 2017-11-30: 13.7 via INTRAVENOUS

## 2018-01-01 ENCOUNTER — Ambulatory Visit (INDEPENDENT_AMBULATORY_CARE_PROVIDER_SITE_OTHER): Payer: PPO | Admitting: Family Medicine

## 2018-01-01 ENCOUNTER — Encounter: Payer: Self-pay | Admitting: Family Medicine

## 2018-01-01 ENCOUNTER — Other Ambulatory Visit: Payer: Self-pay

## 2018-01-01 VITALS — BP 142/80 | HR 58 | Temp 97.8°F | Wt 218.6 lb

## 2018-01-01 DIAGNOSIS — I5032 Chronic diastolic (congestive) heart failure: Secondary | ICD-10-CM

## 2018-01-01 DIAGNOSIS — Z794 Long term (current) use of insulin: Secondary | ICD-10-CM | POA: Diagnosis not present

## 2018-01-01 DIAGNOSIS — E039 Hypothyroidism, unspecified: Secondary | ICD-10-CM

## 2018-01-01 DIAGNOSIS — E1159 Type 2 diabetes mellitus with other circulatory complications: Secondary | ICD-10-CM

## 2018-01-01 LAB — COMPREHENSIVE METABOLIC PANEL
ALK PHOS: 103 U/L (ref 39–117)
ALT: 9 U/L (ref 0–35)
AST: 11 U/L (ref 0–37)
Albumin: 3.9 g/dL (ref 3.5–5.2)
BUN: 12 mg/dL (ref 6–23)
CO2: 30 mEq/L (ref 19–32)
Calcium: 9.2 mg/dL (ref 8.4–10.5)
Chloride: 103 mEq/L (ref 96–112)
Creatinine, Ser: 1.02 mg/dL (ref 0.40–1.20)
GFR: 54.97 mL/min — AB (ref >60.00)
GLUCOSE: 171 mg/dL — AB (ref 70–99)
POTASSIUM: 4.9 meq/L (ref 3.5–5.1)
Sodium: 140 mEq/L (ref 135–145)
TOTAL PROTEIN: 6.6 g/dL (ref 6.0–8.3)
Total Bilirubin: 0.3 mg/dL (ref 0.2–1.2)

## 2018-01-01 LAB — HEMOGLOBIN A1C: Hgb A1c MFr Bld: 8.8 % — ABNORMAL HIGH (ref 4.6–6.5)

## 2018-01-01 LAB — TSH: TSH: 2.04 u[IU]/mL (ref 0.35–4.50)

## 2018-01-01 MED ORDER — INSULIN GLARGINE 100 UNIT/ML SOLOSTAR PEN: PEN_INJECTOR | SUBCUTANEOUS | 2 refills | Status: DC

## 2018-01-01 NOTE — Assessment & Plan Note (Signed)
Check TSH.  Continue Synthroid. 

## 2018-01-01 NOTE — Progress Notes (Signed)
  Tommi Rumps, MD Phone: [phone]  Diane Mullins is a 84 y.o. female who presents today for f/u.  DIABETES Disease Monitoring: Blood Sugar ranges-70-179 Polyuria/phagia/dipsia- no      Optho- UTD Medications: Compliance- taking lantus 17 u, glimeperide, metformin Hypoglycemic symptoms- rare at night, lowest was 70, ate something and improved  Heart failure: Notes she is breathing well.  No orthopnea or PND.  No edema.  She followed up with cardiology and had a reassuring stress test.  She is taking Lasix.  HYPOTHYROIDISM Disease Monitoring Weight changes: no  Skin Changes: no Heat/Cold intolerance: no  Medication Monitoring Compliance:  Taking synthroid   Last TSH:   Lab Results  Component Value Date   TSH 2.25 12/19/2016      Social History   Tobacco Use  Smoking Status Never Smoker  Smokeless Tobacco Never Used     ROS see history of present illness  Objective  Physical Exam Vitals:   01/01/18 1051  BP: (!) 142/80  Pulse: (!) 58  Temp: 97.8 F (36.6 C)  SpO2: 97%    BP Readings from Last 3 Encounters:  01/01/18 (!) 142/80  11/16/17 (!) 144/82  10/30/17 (!) 144/68   Wt Readings from Last 3 Encounters:  01/01/18 218 lb 9.6 oz (99.2 kg)  11/16/17 215 lb (97.5 kg)  10/30/17 219 lb 12.8 oz (99.7 kg)    Physical Exam  Constitutional: No distress.  Cardiovascular: Normal rate, regular rhythm and normal heart sounds.  Pulmonary/Chest: Effort normal and breath sounds normal.  Musculoskeletal: She exhibits no edema.  Neurological: She is alert.  Skin: Skin is warm and dry. She is not diaphoretic.   Diabetic Foot Exam - Simple   Simple Foot Form Diabetic Foot exam was performed with the following findings:  Yes 01/01/2018 11:35 AM  Visual Inspection No deformities, no ulcerations, no other skin breakdown bilaterally:  Yes Sensation Testing Intact to touch and monofilament testing bilaterally:  Yes Pulse Check Posterior Tibialis and Dorsalis  pulse intact bilaterally:  Yes Comments     Assessment/Plan: Please see individual problem list.  Hypothyroidism Check TSH.  Continue Synthroid.  (HFpEF) heart failure with preserved ejection fraction (HCC) Breathing well now.  Has seen cardiology and underwent stress testing which was reassuring.  She will continue to monitor for swelling and breathing issues and contact us or her cardiologist if these develop.  Diabetes ([place]) Continue current regimen.  Check A1c.   Health Maintenance: breast cancer screening discussed with patient though deferred given her age.   Orders Placed This Encounter  Procedures  . Comp Met (CMET)  . TSH  . HgB A1c    Meds ordered this encounter  Medications  . Insulin Glargine (LANTUS SOLOSTAR) 100 UNIT/ML Solostar Pen    Sig: INJECT  17 UNITS SUBCUTANEOUSLY IN THE MORNING    Dispense:  15 mL    Refill:  2     Tommi Rumps, MD [place]

## 2018-01-01 NOTE — Assessment & Plan Note (Signed)
Breathing well now.  Has seen cardiology and underwent stress testing which was reassuring.  She will continue to monitor for swelling and breathing issues and contact us or her cardiologist if these develop.

## 2018-01-01 NOTE — Patient Instructions (Signed)
Nice to see you. We will check lab work today and contact you with the results. Please monitor for swelling and breathing issues.  If these occur please let us or your cardiologist know.

## 2018-01-01 NOTE — Assessment & Plan Note (Signed)
Continue current regimen.  Check A1c. 

## 2018-01-02 ENCOUNTER — Telehealth: Payer: Self-pay

## 2018-01-02 NOTE — Telephone Encounter (Signed)
-----   Message from [name], MD sent at 01/01/2018  5:43 PM EDT ----- Please let the patient know that her A1c is very minimally improved at 8.8.  I would like for her to see Chrys Racer in the office to discuss additional treatments for her diabetes.  If she is willing to do this please get her scheduled for an appointment.  Her kidney function is stable.  Her other lab work is acceptable.  Thanks.

## 2018-01-02 NOTE — Telephone Encounter (Signed)
Left voicemail for patient to call office about lab results. [place] for [facility] to give patient lab results and schedule patient for appointment with Deirdre Pippins.

## 2018-01-03 ENCOUNTER — Telehealth: Payer: Self-pay

## 2018-01-03 DIAGNOSIS — Z794 Long term (current) use of insulin: Principal | ICD-10-CM

## 2018-01-03 DIAGNOSIS — E1159 Type 2 diabetes mellitus with other circulatory complications: Secondary | ICD-10-CM

## 2018-01-03 NOTE — Telephone Encounter (Signed)
Hey - If she can't see me on Mondays, that's OK. She is [facility]-eligible. Please put in order for "[facility]" and in the notes/comments write "patient is known to [place]". I will watch for her in my [place]. If she agrees to [facility] services, I can do home visits or speak with her over the phone.   Carlean Jews, Pharm.D., BCPS PGY2 Ambulatory Care Pharmacy Resident Phone: [phone]

## 2018-01-03 NOTE — Telephone Encounter (Signed)
Patient was notified of lab results but Dr. Caryl Bis wants her to see Chrys Racer. Patient states that she is not able to see her on Monday's. I informed her that I would let Chrys Racer know and maybe Chrys Racer can contact her via phone. Patient states that she is ok with that.

## 2018-01-03 NOTE — Telephone Encounter (Signed)
-----   Message from [name], MD sent at 01/01/2018  5:43 PM EDT ----- Please let the patient know that her A1c is very minimally improved at 8.8.  I would like for her to see Chrys Racer in the office to discuss additional treatments for her diabetes.  If she is willing to do this please get her scheduled for an appointment.  Her kidney function is stable.  Her other lab work is acceptable.  Thanks.

## 2018-01-05 NOTE — Addendum Note (Signed)
Addended by: [name] on: 01/05/2018 10:11 AM   Modules accepted: Orders

## 2018-01-05 NOTE — Telephone Encounter (Signed)
Referral placed.  I will forward this back to [place] just to make sure that this was ordered correctly.

## 2018-01-09 NOTE — Telephone Encounter (Signed)
My mistake - your are supposed to type in the order entry field "Referral to [facility]". There should only be one preferred option that appears: "AMB Referral to [place] [facility]". Select this order.   1. To Dept = [facility]COMMUNITY  2. Reason for consult = type in reason you want [place] to go see her - ex: patient needs medication reconciliation (make sure you note that she works with me here) 3. Select her appropriate dx 4. Contact within 10 days is standard.   Let me know when this is completed and I'll make sure the order was received.   Chrys Racer

## 2018-01-09 NOTE — Telephone Encounter (Signed)
Thank you.  I have completed the new referral.

## 2018-01-09 NOTE — Addendum Note (Signed)
Addended by: [name] on: 01/09/2018 05:46 PM   Modules accepted: Orders

## 2018-01-10 ENCOUNTER — Other Ambulatory Visit: Payer: Self-pay | Admitting: *Deleted

## 2018-01-10 NOTE — Patient Outreach (Signed)
[place] [facility]) Care Management  01/10/2018  Diane Mullins [birth year] [id number]   Mrs. Diane Mullins is an 84 year old female patient with medical history which includes DMII, CHF, Hypothyroidism, Hypertension, Hyperlipidemia, and CAD. [name] was referred to [place] Management for nursing services by Deirdre Pippins, PharmD at [place] for assistance with DM management.   Plan: I will reach out to [name] by phone tomorrow 01/11/18.    [place] Management  [phone]

## 2018-01-11 ENCOUNTER — Other Ambulatory Visit: Payer: Self-pay | Admitting: *Deleted

## 2018-01-11 NOTE — Patient Outreach (Signed)
[place] [facility]) Care Management  01/11/2018  Diane Mullins January 13, 1933 [id number]   Unable to reach [name] today by phone.   Plan: I will reach out to her on Monday.    [place] Management  [phone]

## 2018-01-14 ENCOUNTER — Other Ambulatory Visit: Payer: Self-pay | Admitting: *Deleted

## 2018-01-14 NOTE — Patient Outreach (Signed)
[place] [facility]) Care Management  01/14/2018  DONNELL BEAUCHAMP 07/22/1933 [id number]  Mrs. SOLIMAR MAIDEN is an 84 year old female patient with medical history which includes DMII, CHF, Hypothyroidism, Hypertension, Hyperlipidemia, and CAD. Mrs. Batie was referred to [place] [facility] for nursing services by Deirdre Pippins, PharmD at [place] for assistance with DM management.   I spoke with Mrs. Tessmer by phone today re: Diabetes Management.   Medication Management - we reviewed Mrs. Joyner medications including insulin. Mrs. Boutelle asked me about where she should give herself her insulin injection and said she didn't want to keep giving it in her "stomach" because she feels it is "making it fatter". I reviewed appropriateness of rotating injection sites and prescribed some Emmi educational materials which will be mailed to her today.   Blood glucose monitoring - Mrs. Bricco and I discussed her most recent HgA1C 8.8 (January 9.1; 1 year ago = 8.5) and target. She was fairly familiar with HgA1C rationale and had an idea about target.   Lifestyle/Exercise - Mrs. Candella reports that she attends a structured exercise class [place] premises [place] and also is required to walk to the main buildings daily (meals, etc...)  Diet - Mrs. Giles understands that she should be eating a carb modified diet. She lives in an independent living setting and goes to the common dining area for her meals which are prepared for her. She says she feels her choices are limited at meal time. We discussed her prescribed carb modified, heart healthy/low sodium diet and food choices (for when she is dining in the dining room and at home) and I prescribed Emmi educational materials which will be mailed to her. We will begin working on small changes in her diet in situations where she has choices. I will give Mrs. Chronis an opportunity to review the materials I have sent to her and we will  proceed from there.   Plan: Mrs. Gardiner will review the educational materials I send to her and I will reach out to her in follow up over the next few weeks. I will also discuss Mrs. Prajapati information/needs with Deirdre Pippins PharmD this week.   [facility] CM Care Plan Problem One     Most Recent Value  Care Plan Problem One  Knowledge Deficits related to Diabetes Self Health Management  Role Documenting the Problem One  Care Management Coordinator  Care Plan for Problem One  Active  [facility] Long Term Goal   Over the next 31 days, patient will verbalize understanding of long term plan of care for independent self health management of DMII  [facility] Long Term Goal Start Date  01/14/18  Interventions for Problem One Long Term Goal  reviewed current plan of care, medications, and addressed patient questions  [facility] CM Short Term Goal #1   Over the next 14 days, patient will review diabetes education materials as evidenced by conversation with RNCM  [facility] CM Short Term Goal #1 Start Date  01/14/18  Interventions for Short Term Goal #1  discussion of Emmi educational materials,  prescribed/mailed  TH[facility] CM Short Term Goal #2   Over the next 30 days, patient will verbalize basic understanding of appropriate food choices related to DM management  [facility] CM Short Term Goal #2 Start Date  01/14/18  Interventions for Short Term Goal #2  reviewed prescribed carb modified diet with patient by phone      [place] [facility] Management  (  [phone]        

## 2018-01-14 NOTE — Addendum Note (Signed)
Addended by: Clerance Lav on: 01/14/2018 05:03 PM   Modules accepted: Orders

## 2018-01-18 ENCOUNTER — Telehealth: Payer: Self-pay | Admitting: Pharmacist

## 2018-01-18 NOTE — Patient Outreach (Signed)
[place] [facility]) Care Management  01/18/2018  Diane Mullins Mar 23, 1933 [id number]  84 y.o. year old female referred to [facility] for Medication Management (Pharmacy telephone outreach)   Was unable to reach patient via telephone today and have left HIPAA compliant voicemail asking patient to return my call (unsuccessful outreach #1).  Plan: Will followup within 2 business days via telephone Will send 10 day unsuccessful outreach letter and allow patient 10 days to respond prior to closing case.    Carlean Jews, Pharm.D., BCPS PGY2 Ambulatory Care Pharmacy Resident Phone: [phone]

## 2018-01-22 ENCOUNTER — Telehealth: Payer: Self-pay | Admitting: Pharmacist

## 2018-01-22 NOTE — Patient Outreach (Signed)
[place] [facility]) Care Management  01/22/2018  Diane Mullins Apr 03, 1933 [id number]  85 y.o. year old female referred to [facility] for Medication Management (Pharmacy telephone outreach)   Was unable to reach patient via telephone today and have left HIPAA compliant voicemail asking patient to return my call (unsuccessful outreach #2).  Plan: Will followup within 3 business days via telephone Sent 10 day unsuccessful outreach letter on 01/18/2018  Carlean Jews, Pharm.D., BCPS PGY2 Ambulatory Care Pharmacy Resident Phone: [phone]

## 2018-01-25 ENCOUNTER — Telehealth: Payer: Self-pay | Admitting: Pharmacist

## 2018-01-25 NOTE — Patient Outreach (Signed)
[facility]) Care Management  01/25/2018  Diane Mullins 1932-11-26 [id number]    84 y.o. year old female referred to [place] for Medication Management (Pharmacy telephone outreach)   Was unable to reach patient via telephone today and have left HIPAA compliant voicemail asking patient to return my call (unsuccessful outreach #3).  Plan: Sent 10 day unsuccessful outreach letter on 01/18/2018 If I have not heard from patient by 01/18/2018, will close case  Carlean Jews, Pharm.D., BCPS PGY2 Ambulatory Care Pharmacy Resident Phone: [phone]

## 2018-01-28 ENCOUNTER — Other Ambulatory Visit: Payer: Self-pay | Admitting: *Deleted

## 2018-01-28 NOTE — Patient Outreach (Signed)
[facility]) Care Management  01/28/2018  Diane Mullins 01-Jan-1933 [id number]  Diane Mullins is an 84 year old female patient with medical history which includes DMII, CHF, Hypothyroidism, Hypertension, Hyperlipidemia, and CAD. Mrs. Gwinner was referred to [facility] for nursing services by Deirdre Pippins, PharmD at [place] for assistance with DM management.   I spoke with Mrs. Sandefer by phone on 01/14/18 re: diabetes care and management. Mrs. Audino has also been contacted by our [place] Pharmacist re: medication management needs. However, we have had some difficulty maintaining contact with Mrs. Devries. Deirdre Pippins, PharmD sent a letter to the home of Mrs. Pollina on 01/18/18 indicating her willingness to help with medication management needs. Dr. Thurmond Butts and I spoke and concluded that Mrs. [facility] referral to me was in error.   Plan: As I had an extensive conversation with Mrs. Wintermute last month and she is actively engaged in diabetes self care activities and because we have had difficulty maintaining contact with her, I will close Mrs. Whitehurst case to nursing case management services and will notify her physician of the same. I am happy to assist with Mrs. Allaire nursing case management needs at any time in the future should she be in need and willing to collaborate with [place].    [facility]  [phone]

## 2018-01-29 ENCOUNTER — Ambulatory Visit: Payer: PPO

## 2018-02-06 ENCOUNTER — Telehealth: Payer: Self-pay | Admitting: Pharmacist

## 2018-02-06 NOTE — Patient Outreach (Signed)
[facility]) Care Management  02/06/2018  ZAYNEB BAUCUM 1933-02-05 [id number]  84 y.o. year old female referred to [place] for Case Closure (Pharmacy case closure)   Was unable to reach patient via telephone x3 attempts and patient did not respond to case closure letter within 10 business days. Will close case. Sent letter to patient and Dr. Caryl Bis, primary care physician.   Carlean Jews, Pharm.D., BCPS PGY2 Ambulatory Care Pharmacy Resident Phone: [phone]

## 2018-02-09 ENCOUNTER — Other Ambulatory Visit: Payer: Self-pay | Admitting: Family Medicine

## 2018-02-09 DIAGNOSIS — Z760 Encounter for issue of repeat prescription: Secondary | ICD-10-CM

## 2018-03-12 ENCOUNTER — Ambulatory Visit (INDEPENDENT_AMBULATORY_CARE_PROVIDER_SITE_OTHER): Payer: PPO

## 2018-03-12 VITALS — BP 132/66 | HR 66 | Temp 98.3°F | Resp 15 | Ht 60.5 in | Wt 215.8 lb

## 2018-03-12 DIAGNOSIS — Z0000 Encounter for general adult medical examination without abnormal findings: Secondary | ICD-10-CM | POA: Diagnosis not present

## 2018-03-12 NOTE — Progress Notes (Signed)
Subjective:   Diane Mullins is a 84 y.o. female who presents for Medicare Annual (Subsequent) preventive examination.  Review of Systems:  No ROS.  Medicare Wellness Visit. Additional risk factors are reflected in the social history.  Cardiac Risk Factors include: advanced age (>63mn, >>42women);diabetes mellitus;obesity (BMI >30kg/m2);hypertension     Objective:     Vitals: BP 132/66 (BP Location: Left Arm, Patient Position: Sitting, Cuff Size: Normal)   Pulse 66   Temp 98.3 F (36.8 C) (Oral)   Resp 15   Ht 5' 0.5" (1.537 m)   Wt 215 lb 12.8 oz (97.9 kg)   SpO2 96%   BMI 41.45 kg/m   Body mass index is 41.45 kg/m.  Advanced Directives 03/12/2018 01/14/2018 09/24/2017 12/19/2016 03/30/2016 10/28/2015 09/18/2015  Does Patient Have a Medical Advance Directive? Yes Yes Yes Yes No Yes Yes  Type of AParamed[place]of [place] will Healthcare Power of [place] will [place] will - Healthcare Power of [place] Does patient want to make changes to medical advance directive? No - Patient declined No - Patient declined No - Patient declined No - Patient declined - No - Patient declined No - Patient declined  Copy of [place]in Chart? No - copy requested No - copy requested No - copy requested No - copy requested - No - copy requested No - copy requested  Would patient like information on creating a medical advance directive? - - No - Patient declined - - - -    Tobacco Social History   Tobacco Use  Smoking Status Never Smoker  Smokeless Tobacco Never Used     Counseling given: Not Answered   Clinical Intake:  Pre-visit preparation completed: Yes  Pain : No/denies pain     Diabetes: Yes(Followed by pcp)  How often do you need to have someone help you when you read instructions, pamphlets, or other written materials from your doctor or  pharmacy?: 1 - Never  Interpreter Needed?: No     Past Medical History:  Diagnosis Date  . Benign paroxysmal positional vertigo 07/17/2014  . Diabetes mellitus   . H/O: rheumatic fever   . Hyperlipidemia   . Hypertension   . Stroke ([facility]    TIA Jan. 1st  . Thyroid disease    Past Surgical History:  Procedure Laterality Date  . ABDOMINAL HYSTERECTOMY  1973   menorrhagia  . HERNIA REPAIR  1994  . ORIF ANKLE FRACTURE Right 10/29/2015   Procedure: OPEN REDUCTION INTERNAL FIXATION (ORIF) ANKLE FRACTURE;  Surgeon: KThornton Park MD;  Location: [facility] ORS;  Service: Orthopedics;  Laterality: Right;  .Marland KitchenVAGINAL DELIVERY     x4   Family History  Problem Relation Age of Onset  . Heart disease Mother 515 . Heart attack Mother 566 . Cancer Father        colon  . Diabetes Sister   . Hypertension Sister   . Cancer Brother        kidney  . Diabetes Brother   . Cancer Daughter        ovarian  . Non-Hodgkin's lymphoma Son    Social History   Socioeconomic History  . Marital status: Widowed    Spouse name: Not on file  . Number of children: 4  . Years of education: Not on file  . Highest education level: Not on file  Occupational History  . Not  on file  Social Needs  . Financial resource strain: Not hard at all  . Food insecurity:    Worry: Never true    Inability: Never true  . Transportation needs:    Medical: No    Non-medical: No  Tobacco Use  . Smoking status: Never Smoker  . Smokeless tobacco: Never Used  Substance and Sexual Activity  . Alcohol use: No  . Drug use: No  . Sexual activity: Never  Lifestyle  . Physical activity:    Days per week: 3 days    Minutes per session: 50 min  . Stress: Not at all  Relationships  . Social connections:    Talks on phone: Not on file    Gets together: Not on file    Attends religious service: Not on file    Active member of club or organization: Not on file    Attends meetings of clubs or organizations: Not on file      Relationship status: Not on file  Other Topics Concern  . Not on file  Social History Narrative   Lives in [place] alone. No pets. Retired from [employer].   Diet: healthy   Exercise: water aerobics twice weekly, balance class   Ambulates with a walker at baseline    Outpatient Encounter Medications as of 03/12/2018  Medication Sig  . aspirin EC 81 MG tablet Take 81 mg by mouth daily.  . furosemide (LASIX) 20 MG tablet Take 1/2 tablet (67m) by mouth every day  . glimepiride (AMARYL) 4 MG tablet Take 1 tablet by mouth every morning and 1/2 tablet every evening  . glucose blood (ONE TOUCH ULTRA TEST) test strip Use as instructed to check Blood sugar three times a day.  E11.65 Dispense patient choice.  . Insulin Glargine (LANTUS SOLOSTAR) 100 UNIT/ML Solostar Pen INJECT  17 UNITS SUBCUTANEOUSLY IN THE MORNING  . Insulin Pen Needle (PEN NEEDLES) 32G X 4 MM MISC Inject 1 Dose into the skin daily.  .Marland Kitchenketoconazole (NIZORAL) 2 % cream Apply 1 application daily topically.  .Marland Kitchenlevothyroxine (SYNTHROID, LEVOTHROID) 75 MCG tablet Take 1 tablet (75 mcg total) by mouth daily.  .Marland Kitchenlosartan (COZAAR) 50 MG tablet Take 1 tablet (50 mg total) by mouth daily.  . metFORMIN (GLUCOPHAGE) 1000 MG tablet Take 1 tablet (1,000 mg total) by mouth 2 (two) times daily with a meal. TAKE 1 TABLET TWICE DAILY  WITH  A  MEAL  . metoprolol succinate (TOPROL-XL) 100 MG 24 hr tablet Take 1 tablet (100 mg total) by mouth daily.  .Marland Kitchenomeprazole (PRILOSEC) 20 MG capsule Take 1 capsule (20 mg total) by mouth daily.  . potassium chloride SA (K-DUR,KLOR-CON) 10 MEQ tablet Take 1 tablet (10 mEq total) by mouth daily.  . simvastatin (ZOCOR) 40 MG tablet Take 1 tablet by mouth every evening   No facility-administered encounter medications on file as of 03/12/2018.     Activities of Daily Living In your present state of health, do you have any difficulty performing the following activities: 03/12/2018 01/14/2018  Hearing? Y N  Comment  Wears hearing aids -  Vision? N N  Difficulty concentrating or making decisions? N N  Walking or climbing stairs? Y N  Comment Unsteady gait -  Dressing or bathing? N N  Doing errands, shopping? Y Y  Comment She does not drive -Facilities managerand eating ? YGlendoraprepares meals.  Self feeds. food prepared in a cafeteria/dining room on the premises  of her independent living community  Using the Toilet? N N  In the past six months, have you accidently leaked urine? Y N  Comment Managed with a daily pad or brief. Followed by pcp. -  Do you have problems with loss of bowel control? N N  Managing your Medications? N N  Managing your Finances? Y N  Comment Daughter assists -  Housekeeping or managing your Housekeeping? Y N  Comment She does not drive -  Some recent data might be hidden    Patient Care Team: [name], MD as PCP - General (Family Medicine)    Assessment:   This is a routine wellness examination for Celise.  The goal of the wellness visit is to assist the patient how to close the gaps in care and create a preventative care plan for the patient.   The roster of all physicians providing medical care to patient is listed in the Snapshot section of the chart.  Osteoporosis risk reviewed.    Safety issues reviewed; Life alert. Smoke and carbon monoxide detectors in the home. No firearms in the home. Wears seatbelts when riding with others. No violence in the home.  They do not have excessive sun exposure.  Discussed the need for sun protection: hats, long sleeves and the use of sunscreen if there is significant sun exposure.  Patient is alert, normal appearance, oriented to person/place/and time. Correctly identified the president of the [place] and recalls of 2/3 words.Performs simple calculations and can read correct time from watch face. Displays appropriate judgement.  No new identified risk were noted.  No failures at ADL's or IADL's.  Ambulates  with walker rolator.  BMI- discussed the importance of a healthy diet, water intake and the benefits of aerobic exercise. Educational material provided.   24 hour diet recall: Regular diet  Dental- every 6 months.  Eye- Visual acuity not assessed per patient preference since they have regular follow up with the ophthalmologist.  Wears corrective lenses.  Sleep patterns- Sleeps 5 hours at night.  Nocturia x3.  Naps during the day as needed.  Encouraged to monitor caffeine intake and stop by 2:00. Decrease fluid intake 2 hours before bedtime.      Health maintenance gaps- closed.  Patient Concerns: None at this time. Follow up with PCP as needed.  Exercise Activities and Dietary recommendations Current Exercise Habits: Home exercise routine, Type of exercise: yoga;stretching(Tai-Chi ), Time (Minutes): 60, Frequency (Times/Week): 3, Weekly Exercise (Minutes/Week): 180, Intensity: Mild  Goals    . Maintain Healthy Lifestyle     Monitor blood sugar Monitor blood pressure Stay hydrated Exercise Make healthy choices when eating; low carb Routine maintenance with your doctor       Fall Risk Fall Risk  03/12/2018 01/14/2018 01/01/2018 12/19/2016 03/24/2016  Falls in the past year? Yes No No Yes Yes  Number falls in past yr: 1 - - 1 2 or more  Injury with Fall? No - - Yes Yes  Comment The walker was off balance with weight on the handle and tipped over.  She was able to stand up on her own without injury or assistance.  - - - -  Risk Factor Category  - - - High Fall Risk High Fall Risk  Risk for fall due to : Impaired mobility - - History of fall(s) -  Follow up Falls prevention discussed;Education provided - - Education provided;Falls prevention discussed -   Depression Screen [facility] 2/9 Scores 03/12/2018 01/14/2018 01/01/2018 12/19/2016  PHQ - 2 Score 0 0 0 0     Cognitive Function MMSE - Mini Mental State Exam 08/19/2015  Orientation to time 5  Orientation to Place 5  Registration 3    Attention/ Calculation 5  Recall 3  Language- name 2 objects 2  Language- repeat 1  Language- follow 3 step command 3  Language- read & follow direction 1  Write a sentence 1  Copy design 1  Total score 30     6CIT Screen 03/12/2018 12/19/2016  What Year? 0 points 0 points  What month? 0 points 0 points  What time? 0 points 0 points  Count back from 20 0 points 0 points  Months in reverse 0 points 0 points  Repeat phrase 0 points -  Total Score 0 -    Immunization History  Administered Date(s) Administered  . Influenza Split 09/19/2011, 05/27/2012  . Influenza,inj,Quad PF,6+ Mos 07/02/2013, 06/03/2014, 06/17/2015  . Pneumococcal Conjugate-13 12/01/2013  . Pneumococcal Polysaccharide-23 12/22/2011  . Tdap 06/19/2011  . Zoster 06/19/2007   Screening Tests Health Maintenance  Topic Date Due  . INFLUENZA VACCINE  04/18/2018  . OPHTHALMOLOGY EXAM  06/05/2018  . HEMOGLOBIN A1C  07/03/2018  . FOOT EXAM  01/02/2019  . TETANUS/TDAP  06/18/2021  . DEXA SCAN  Completed  . PNA vac Low Risk Adult  Completed      Plan:    End of life planning; Advance aging; Advanced directives discussed. Copy of current HCPOA/Living Will requested.    I have personally reviewed and noted the following in the patient's chart:   . Medical and social history . Use of alcohol, tobacco or illicit drugs  . Current medications and supplements . Functional ability and status . Nutritional status . Physical activity . Advanced directives . List of other physicians . Hospitalizations, surgeries, and ER visits in previous 12 months . Vitals . Screenings to include cognitive, depression, and falls . Referrals and appointments  In addition, I have reviewed and discussed with patient certain preventive protocols, quality metrics, and best practice recommendations. A written personalized care plan for preventive services as well as general preventive health recommendations were provided to patient.      Varney Biles, LPN  6/80/3212

## 2018-03-12 NOTE — Patient Instructions (Addendum)
  Diane Mullins , Thank you for taking time to come for your Medicare Wellness Visit. I appreciate your ongoing commitment to your health goals. Please review the following plan we discussed and let me know if I can assist you in the future.   Follow up as needed.    Bring a copy of your [place] and/or Living Will to be scanned into chart.  Have a great day!  These are the goals we discussed: Goals    . Maintain Healthy Lifestyle     Monitor blood sugar Monitor blood pressure Stay hydrated Exercise        This is a list of the screening recommended for you and due dates:  Health Maintenance  Topic Date Due  . Flu Shot  04/18/2018  . Eye exam for diabetics  06/05/2018  . Hemoglobin A1C  07/03/2018  . Complete foot exam   01/02/2019  . Tetanus Vaccine  06/18/2021  . DEXA scan (bone density measurement)  Completed  . Pneumonia vaccines  Completed

## 2018-04-09 ENCOUNTER — Ambulatory Visit (INDEPENDENT_AMBULATORY_CARE_PROVIDER_SITE_OTHER): Payer: PPO | Admitting: Family Medicine

## 2018-04-09 ENCOUNTER — Encounter: Payer: Self-pay | Admitting: Family Medicine

## 2018-04-09 VITALS — BP 138/70 | HR 67 | Temp 97.7°F | Ht 61.0 in | Wt 217.0 lb

## 2018-04-09 DIAGNOSIS — H9193 Unspecified hearing loss, bilateral: Secondary | ICD-10-CM | POA: Diagnosis not present

## 2018-04-09 DIAGNOSIS — Z794 Long term (current) use of insulin: Secondary | ICD-10-CM

## 2018-04-09 DIAGNOSIS — I10 Essential (primary) hypertension: Secondary | ICD-10-CM | POA: Diagnosis not present

## 2018-04-09 DIAGNOSIS — Z760 Encounter for issue of repeat prescription: Secondary | ICD-10-CM

## 2018-04-09 DIAGNOSIS — E1159 Type 2 diabetes mellitus with other circulatory complications: Secondary | ICD-10-CM | POA: Diagnosis not present

## 2018-04-09 DIAGNOSIS — E782 Mixed hyperlipidemia: Secondary | ICD-10-CM | POA: Diagnosis not present

## 2018-04-09 LAB — POCT GLYCOSYLATED HEMOGLOBIN (HGB A1C): Hemoglobin A1C: 8.1 % — AB (ref 4.0–5.6)

## 2018-04-09 LAB — LDL CHOLESTEROL, DIRECT: LDL DIRECT: 80 mg/dL

## 2018-04-09 MED ORDER — INSULIN GLARGINE 100 UNIT/ML SOLOSTAR PEN: PEN_INJECTOR | SUBCUTANEOUS | 2 refills | Status: DC

## 2018-04-09 MED ORDER — GLIMEPIRIDE 4 MG PO TABS
ORAL_TABLET | ORAL | 2 refills | Status: DC
Start: 2018-04-09 — End: 2018-07-09

## 2018-04-09 MED ORDER — SIMVASTATIN 40 MG PO TABS: 40.0000 mg | ORAL_TABLET | Freq: Every evening | ORAL | 2 refills | Status: DC

## 2018-04-09 MED ORDER — FUROSEMIDE 20 MG PO TABS: ORAL_TABLET | ORAL | 0 refills | Status: DC

## 2018-04-09 MED ORDER — LEVOTHYROXINE SODIUM 75 MCG PO TABS: 75.0000 ug | ORAL_TABLET | Freq: Every day | ORAL | 3 refills | Status: DC

## 2018-04-09 MED ORDER — OMEPRAZOLE 20 MG PO CPDR: 20.0000 mg | DELAYED_RELEASE_CAPSULE | Freq: Every day | ORAL | 3 refills | Status: DC

## 2018-04-09 MED ORDER — METOPROLOL SUCCINATE ER 100 MG PO TB24: 100.0000 mg | ORAL_TABLET | Freq: Every day | ORAL | 3 refills | Status: DC

## 2018-04-09 MED ORDER — METFORMIN HCL 1000 MG PO TABS: 1000.0000 mg | ORAL_TABLET | Freq: Two times a day (BID) | ORAL | 3 refills | Status: DC

## 2018-04-09 MED ORDER — LOSARTAN POTASSIUM 50 MG PO TABS: 50.0000 mg | ORAL_TABLET | Freq: Every day | ORAL | 3 refills | Status: DC

## 2018-04-09 NOTE — Assessment & Plan Note (Signed)
Chronic issue.  Encouraged her to wear her hearing aids.  Cerumen impaction on the right irrigated by CMA and tolerated well.

## 2018-04-09 NOTE — Assessment & Plan Note (Signed)
Check LDL.  Continue simvastatin.

## 2018-04-09 NOTE — Assessment & Plan Note (Signed)
Check A1c.  Continue current regimen. 

## 2018-04-09 NOTE — Addendum Note (Signed)
Addended by: Arby Barrette on: 04/09/2018 11:36 AM   Modules accepted: Orders

## 2018-04-09 NOTE — Patient Instructions (Signed)
Nice to see you. We will check some lab work today and contact you with the results. Please make sure you wear your hearing aids.

## 2018-04-09 NOTE — Assessment & Plan Note (Signed)
Well-controlled.  Continue current regimen. 

## 2018-04-09 NOTE — Progress Notes (Signed)
Tommi Rumps, MD Phone: [phone]  Diane Mullins is a 84 y.o. female who presents today for f/u.  CC: htn, hld, dm, cerumen impaction  HYPERTENSION Disease Monitoring: Blood pressure range-not checking Chest pain- no      Dyspnea- no Medications: Compliance- taking metoprolol, losartan, lasix   Edema- rare with heat and if she does not prop her legs up. No orthopnea or PND.   DIABETES Disease Monitoring: Blood Sugar ranges-80s-102 Polyuria/phagia/dipsia- no      Optho- UTD Medications: Compliance- taking Lantus 17 units, metoprolol, glimepiride  hypoglycemic symptoms-rare she will have some sweating and the need some vanilla wafers and improve  HYPERLIPIDEMIA Disease Monitoring: See symptoms for Hypertension Medications: Compliance-taking simvastatin right upper quadrant pain-no muscle aches-no  Patient has chronic issues with hearing difficulty.  She wears hearing aids that did not wear them today.   Social History   Tobacco Use  Smoking Status Never Smoker  Smokeless Tobacco Never Used     ROS see history of present illness  Objective  Physical Exam Vitals:   04/09/18 1054  BP: 138/70  Pulse: 67  Temp: 97.7 F (36.5 C)  SpO2: 95%    BP Readings from Last 3 Encounters:  04/09/18 138/70  03/12/18 132/66  01/01/18 (!) 142/80   Wt Readings from Last 3 Encounters:  04/09/18 217 lb (98.4 kg)  03/12/18 215 lb 12.8 oz (97.9 kg)  01/01/18 218 lb 9.6 oz (99.2 kg)    Physical Exam  Constitutional: No distress.  HENT:  Cerumen noted in the right ear canal obscuring the right TM, this was irrigated by the CMA revealing a normal right TM, this was tolerated well, left TM appears normal  Cardiovascular: Normal rate, regular rhythm and normal heart sounds.  Pulmonary/Chest: Effort normal and breath sounds normal.  Musculoskeletal: She exhibits no edema.  Neurological: She is alert.  Skin: Skin is warm and dry. She is not diaphoretic.      Assessment/Plan: Please see individual problem list.  Hypertension Well-controlled.  Continue current regimen.  Diabetes (HCC) Check A1c.  Continue current regimen.  Hearing loss Chronic issue.  Encouraged her to wear her hearing aids.  Cerumen impaction on the right irrigated by CMA and tolerated well.  Hyperlipidemia Check LDL.  Continue simvastatin.   Orders Placed This Encounter  Procedures  . Direct LDL  . HgB A1c    Meds ordered this encounter  Medications  . furosemide (LASIX) 20 MG tablet    Sig: Take 1/2 tablet (10mg ) by mouth every day    Dispense:  45 tablet    Refill:  0    Refill [id number]  . glimepiride (AMARYL) 4 MG tablet    Sig: Take 1 tablet by mouth every morning and 1/2 tablet every evening    Dispense:  135 tablet    Refill:  2    Refill [id number]  . levothyroxine (SYNTHROID, LEVOTHROID) 75 MCG tablet    Sig: Take 1 tablet (75 mcg total) by mouth daily.    Dispense:  90 tablet    Refill:  3  . losartan (COZAAR) 50 MG tablet    Sig: Take 1 tablet (50 mg total) by mouth daily.    Dispense:  90 tablet    Refill:  3  . metFORMIN (GLUCOPHAGE) 1000 MG tablet    Sig: Take 1 tablet (1,000 mg total) by mouth 2 (two) times daily with a meal. TAKE 1 TABLET TWICE DAILY  WITH  A  MEAL    Dispense:  180 tablet    Refill:  3  . metoprolol succinate (TOPROL-XL) 100 MG 24 hr tablet    Sig: Take 1 tablet (100 mg total) by mouth daily.    Dispense:  90 tablet    Refill:  3  . Insulin Glargine (LANTUS SOLOSTAR) 100 UNIT/ML Solostar Pen    Sig: INJECT  17 UNITS SUBCUTANEOUSLY IN THE MORNING    Dispense:  15 mL    Refill:  2  . omeprazole (PRILOSEC) 20 MG capsule    Sig: Take 1 capsule (20 mg total) by mouth daily.    Dispense:  90 capsule    Refill:  3  . simvastatin (ZOCOR) 40 MG tablet    Sig: Take 1 tablet (40 mg total) by mouth every evening.    Dispense:  90 tablet    Refill:  2    Refill [id number]     Tommi Rumps, MD [place]

## 2018-07-02 ENCOUNTER — Other Ambulatory Visit: Payer: Self-pay | Admitting: Family Medicine

## 2018-07-05 ENCOUNTER — Emergency Department: Payer: PPO

## 2018-07-05 ENCOUNTER — Inpatient Hospital Stay
Admission: EM | Admit: 2018-07-05 | Discharge: 2018-07-09 | DRG: 291 | Disposition: A | Discharge status: Skilled Nursing Facility | Payer: PPO | Attending: Internal Medicine | Admitting: Internal Medicine

## 2018-07-05 ENCOUNTER — Other Ambulatory Visit: Payer: Self-pay

## 2018-07-05 ENCOUNTER — Encounter: Payer: Self-pay | Admitting: Emergency Medicine

## 2018-07-05 DIAGNOSIS — D271 Benign neoplasm of left ovary: Secondary | ICD-10-CM | POA: Diagnosis not present

## 2018-07-05 DIAGNOSIS — Z885 Allergy status to narcotic agent status: Secondary | ICD-10-CM

## 2018-07-05 DIAGNOSIS — A084 Viral intestinal infection, unspecified: Secondary | ICD-10-CM | POA: Diagnosis not present

## 2018-07-05 DIAGNOSIS — E11649 Type 2 diabetes mellitus with hypoglycemia without coma: Secondary | ICD-10-CM | POA: Diagnosis not present

## 2018-07-05 DIAGNOSIS — Z8051 Family history of malignant neoplasm of kidney: Secondary | ICD-10-CM

## 2018-07-05 DIAGNOSIS — E785 Hyperlipidemia, unspecified: Secondary | ICD-10-CM | POA: Diagnosis not present

## 2018-07-05 DIAGNOSIS — I272 Pulmonary hypertension, unspecified: Secondary | ICD-10-CM | POA: Diagnosis present

## 2018-07-05 DIAGNOSIS — R197 Diarrhea, unspecified: Secondary | ICD-10-CM | POA: Diagnosis not present

## 2018-07-05 DIAGNOSIS — M6281 Muscle weakness (generalized): Secondary | ICD-10-CM | POA: Diagnosis not present

## 2018-07-05 DIAGNOSIS — D638 Anemia in other chronic diseases classified elsewhere: Secondary | ICD-10-CM | POA: Diagnosis not present

## 2018-07-05 DIAGNOSIS — K5790 Diverticulosis of intestine, part unspecified, without perforation or abscess without bleeding: Secondary | ICD-10-CM | POA: Diagnosis not present

## 2018-07-05 DIAGNOSIS — I071 Rheumatic tricuspid insufficiency: Secondary | ICD-10-CM | POA: Diagnosis not present

## 2018-07-05 DIAGNOSIS — J9690 Respiratory failure, unspecified, unspecified whether with hypoxia or hypercapnia: Secondary | ICD-10-CM | POA: Diagnosis present

## 2018-07-05 DIAGNOSIS — J811 Chronic pulmonary edema: Secondary | ICD-10-CM | POA: Diagnosis not present

## 2018-07-05 DIAGNOSIS — N179 Acute kidney failure, unspecified: Secondary | ICD-10-CM | POA: Diagnosis not present

## 2018-07-05 DIAGNOSIS — Z741 Need for assistance with personal care: Secondary | ICD-10-CM | POA: Diagnosis not present

## 2018-07-05 DIAGNOSIS — I700 Atherosclerosis of aorta: Secondary | ICD-10-CM | POA: Diagnosis not present

## 2018-07-05 DIAGNOSIS — K449 Diaphragmatic hernia without obstruction or gangrene: Secondary | ICD-10-CM | POA: Diagnosis present

## 2018-07-05 DIAGNOSIS — R262 Difficulty in walking, not elsewhere classified: Secondary | ICD-10-CM | POA: Diagnosis not present

## 2018-07-05 DIAGNOSIS — N83202 Unspecified ovarian cyst, left side: Secondary | ICD-10-CM | POA: Diagnosis not present

## 2018-07-05 DIAGNOSIS — Z833 Family history of diabetes mellitus: Secondary | ICD-10-CM

## 2018-07-05 DIAGNOSIS — I5033 Acute on chronic diastolic (congestive) heart failure: Secondary | ICD-10-CM

## 2018-07-05 DIAGNOSIS — R112 Nausea with vomiting, unspecified: Secondary | ICD-10-CM | POA: Diagnosis not present

## 2018-07-05 DIAGNOSIS — Z6841 Body Mass Index (BMI) 40.0 and over, adult: Secondary | ICD-10-CM | POA: Diagnosis not present

## 2018-07-05 DIAGNOSIS — Z8673 Personal history of transient ischemic attack (TIA), and cerebral infarction without residual deficits: Secondary | ICD-10-CM | POA: Diagnosis not present

## 2018-07-05 DIAGNOSIS — J9601 Acute respiratory failure with hypoxia: Secondary | ICD-10-CM

## 2018-07-05 DIAGNOSIS — J111 Influenza due to unidentified influenza virus with other respiratory manifestations: Secondary | ICD-10-CM | POA: Diagnosis not present

## 2018-07-05 DIAGNOSIS — E039 Hypothyroidism, unspecified: Secondary | ICD-10-CM | POA: Diagnosis not present

## 2018-07-05 DIAGNOSIS — R531 Weakness: Secondary | ICD-10-CM

## 2018-07-05 DIAGNOSIS — E873 Alkalosis: Secondary | ICD-10-CM | POA: Diagnosis not present

## 2018-07-05 DIAGNOSIS — I5030 Unspecified diastolic (congestive) heart failure: Secondary | ICD-10-CM | POA: Diagnosis not present

## 2018-07-05 DIAGNOSIS — Z7982 Long term (current) use of aspirin: Secondary | ICD-10-CM

## 2018-07-05 DIAGNOSIS — D509 Iron deficiency anemia, unspecified: Secondary | ICD-10-CM | POA: Diagnosis present

## 2018-07-05 DIAGNOSIS — R278 Other lack of coordination: Secondary | ICD-10-CM | POA: Diagnosis not present

## 2018-07-05 DIAGNOSIS — Z79890 Hormone replacement therapy (postmenopausal): Secondary | ICD-10-CM

## 2018-07-05 DIAGNOSIS — Z807 Family history of other malignant neoplasms of lymphoid, hematopoietic and related tissues: Secondary | ICD-10-CM

## 2018-07-05 DIAGNOSIS — D649 Anemia, unspecified: Secondary | ICD-10-CM | POA: Diagnosis not present

## 2018-07-05 DIAGNOSIS — Z66 Do not resuscitate: Secondary | ICD-10-CM | POA: Diagnosis present

## 2018-07-05 DIAGNOSIS — Z8041 Family history of malignant neoplasm of ovary: Secondary | ICD-10-CM

## 2018-07-05 DIAGNOSIS — E119 Type 2 diabetes mellitus without complications: Secondary | ICD-10-CM | POA: Diagnosis not present

## 2018-07-05 DIAGNOSIS — I110 Hypertensive heart disease with heart failure: Principal | ICD-10-CM | POA: Diagnosis present

## 2018-07-05 DIAGNOSIS — I509 Heart failure, unspecified: Secondary | ICD-10-CM

## 2018-07-05 DIAGNOSIS — Z90710 Acquired absence of both cervix and uterus: Secondary | ICD-10-CM

## 2018-07-05 DIAGNOSIS — E869 Volume depletion, unspecified: Secondary | ICD-10-CM | POA: Diagnosis present

## 2018-07-05 DIAGNOSIS — N839 Noninflammatory disorder of ovary, fallopian tube and broad ligament, unspecified: Secondary | ICD-10-CM | POA: Diagnosis present

## 2018-07-05 DIAGNOSIS — Z8249 Family history of ischemic heart disease and other diseases of the circulatory system: Secondary | ICD-10-CM

## 2018-07-05 DIAGNOSIS — Z7401 Bed confinement status: Secondary | ICD-10-CM | POA: Diagnosis not present

## 2018-07-05 DIAGNOSIS — J810 Acute pulmonary edema: Secondary | ICD-10-CM | POA: Diagnosis not present

## 2018-07-05 DIAGNOSIS — J9600 Acute respiratory failure, unspecified whether with hypoxia or hypercapnia: Secondary | ICD-10-CM | POA: Diagnosis not present

## 2018-07-05 DIAGNOSIS — Z794 Long term (current) use of insulin: Secondary | ICD-10-CM

## 2018-07-05 DIAGNOSIS — R69 Illness, unspecified: Secondary | ICD-10-CM

## 2018-07-05 DIAGNOSIS — Z79899 Other long term (current) drug therapy: Secondary | ICD-10-CM

## 2018-07-05 LAB — URINALYSIS, COMPLETE (UACMP) WITH MICROSCOPIC
Bilirubin Urine: NEGATIVE
Glucose, UA: 50 mg/dL — AB
Ketones, ur: NEGATIVE mg/dL
NITRITE: NEGATIVE
Protein, ur: NEGATIVE mg/dL
SPECIFIC GRAVITY, URINE: 1.044 — AB (ref 1.005–1.030)
pH: 5 (ref 5.0–8.0)

## 2018-07-05 LAB — BLOOD GAS, ARTERIAL
Acid-Base Excess: 4.8 mmol/L — ABNORMAL HIGH (ref 0.0–2.0)
Bicarbonate: 31.1 mmol/L — ABNORMAL HIGH (ref 20.0–28.0)
FIO2: 0.28
O2 Saturation: 98.5 %
PH ART: 7.36 (ref 7.350–7.450)
Patient temperature: 37
pCO2 arterial: 55 mmHg — ABNORMAL HIGH (ref 32.0–48.0)
pO2, Arterial: 120 mmHg — ABNORMAL HIGH (ref 83.0–108.0)

## 2018-07-05 LAB — CBC
HCT: 30.6 % — ABNORMAL LOW (ref 36.0–46.0)
Hemoglobin: 8.9 g/dL — ABNORMAL LOW (ref 12.0–15.0)
MCH: 22.3 pg — ABNORMAL LOW (ref 26.0–34.0)
MCHC: 29.1 g/dL — ABNORMAL LOW (ref 30.0–36.0)
MCV: 76.5 fL — AB (ref 80.0–100.0)
NRBC: 0 % (ref 0.0–0.2)
PLATELETS: 325 10*3/uL (ref 150–400)
RBC: 4 MIL/uL (ref 3.87–5.11)
RDW: 17.2 % — AB (ref 11.5–15.5)
WBC: 8.9 10*3/uL (ref 4.0–10.5)

## 2018-07-05 LAB — COMPREHENSIVE METABOLIC PANEL
ALK PHOS: 91 U/L (ref 38–126)
ALT: 11 U/L (ref 0–44)
AST: 20 U/L (ref 15–41)
Albumin: 3.3 g/dL — ABNORMAL LOW (ref 3.5–5.0)
Anion gap: 10 (ref 5–15)
BILIRUBIN TOTAL: 0.4 mg/dL (ref 0.3–1.2)
BUN: 10 mg/dL (ref 8–23)
CALCIUM: 8.5 mg/dL — AB (ref 8.9–10.3)
CO2: 26 mmol/L (ref 22–32)
CREATININE: 0.97 mg/dL (ref 0.44–1.00)
Chloride: 103 mmol/L (ref 98–111)
GFR, EST NON AFRICAN AMERICAN: 53 mL/min — AB (ref >60)
Glucose, Bld: 225 mg/dL — ABNORMAL HIGH (ref 70–99)
Potassium: 3.8 mmol/L (ref 3.5–5.1)
Sodium: 139 mmol/L (ref 135–145)
TOTAL PROTEIN: 6.3 g/dL — AB (ref 6.5–8.1)

## 2018-07-05 LAB — TROPONIN I: Troponin I: 0.03 ng/mL (ref <0.03)

## 2018-07-05 LAB — LIPASE, BLOOD: Lipase: 21 U/L (ref 11–51)

## 2018-07-05 LAB — GLUCOSE, CAPILLARY: GLUCOSE-CAPILLARY: 94 mg/dL (ref 70–99)

## 2018-07-05 LAB — INFLUENZA PANEL BY PCR (TYPE A & B)
Influenza A By PCR: NEGATIVE
Influenza B By PCR: NEGATIVE

## 2018-07-05 MED ORDER — ASPIRIN EC 81 MG PO TBEC
81.0000 mg | DELAYED_RELEASE_TABLET | Freq: Every day | ORAL | Status: DC
  Administered 2018-07-06 – 2018-07-09 (×4): 81 mg via ORAL
  Filled 2018-07-05 (×4): qty 1

## 2018-07-05 MED ORDER — IOPAMIDOL (ISOVUE-300) INJECTION 61%
100.0000 mL | Freq: Once | INTRAVENOUS | Status: AC | PRN
  Administered 2018-07-05: 100 mL via INTRAVENOUS

## 2018-07-05 MED ORDER — FLORANEX PO PACK
1.0000 g | PACK | Freq: Three times a day (TID) | ORAL | Status: DC
  Administered 2018-07-06 – 2018-07-08 (×7): 1 g via ORAL
  Filled 2018-07-05 (×14): qty 1

## 2018-07-05 MED ORDER — INSULIN ASPART 100 UNIT/ML ~~LOC~~ SOLN
0.0000 [IU] | Freq: Three times a day (TID) | SUBCUTANEOUS | Status: DC
  Administered 2018-07-06: 2 [IU] via SUBCUTANEOUS
  Administered 2018-07-06: 5 [IU] via SUBCUTANEOUS
  Administered 2018-07-07: 2 [IU] via SUBCUTANEOUS
  Administered 2018-07-07 – 2018-07-08 (×2): 3 [IU] via SUBCUTANEOUS
  Filled 2018-07-05 (×5): qty 1

## 2018-07-05 MED ORDER — GLIMEPIRIDE 2 MG PO TABS
2.0000 mg | ORAL_TABLET | Freq: Every day | ORAL | Status: DC
  Administered 2018-07-06 – 2018-07-08 (×3): 2 mg via ORAL
  Filled 2018-07-05 (×4): qty 1

## 2018-07-05 MED ORDER — LOSARTAN POTASSIUM 50 MG PO TABS
50.0000 mg | ORAL_TABLET | Freq: Every day | ORAL | Status: DC
  Administered 2018-07-07: 50 mg via ORAL
  Filled 2018-07-05 (×2): qty 1

## 2018-07-05 MED ORDER — BUDESONIDE 0.5 MG/2ML IN SUSP
0.5000 mg | Freq: Two times a day (BID) | RESPIRATORY_TRACT | Status: DC
  Administered 2018-07-05 – 2018-07-06 (×3): 0.5 mg via RESPIRATORY_TRACT
  Filled 2018-07-05 (×3): qty 2

## 2018-07-05 MED ORDER — POTASSIUM CHLORIDE CRYS ER 10 MEQ PO TBCR
10.0000 meq | EXTENDED_RELEASE_TABLET | Freq: Every day | ORAL | Status: DC
  Administered 2018-07-06 – 2018-07-09 (×4): 10 meq via ORAL
  Filled 2018-07-05 (×4): qty 1

## 2018-07-05 MED ORDER — ONDANSETRON HCL 4 MG PO TABS: 4.0000 mg | ORAL_TABLET | Freq: Four times a day (QID) | ORAL | Status: DC | PRN

## 2018-07-05 MED ORDER — ONDANSETRON 4 MG PO TBDP: 4.0000 mg | ORAL_TABLET | Freq: Three times a day (TID) | ORAL | 0 refills | Status: DC | PRN

## 2018-07-05 MED ORDER — ACETAMINOPHEN 325 MG PO TABS: 650.0000 mg | ORAL_TABLET | Freq: Four times a day (QID) | ORAL | Status: DC | PRN

## 2018-07-05 MED ORDER — LEVOTHYROXINE SODIUM 50 MCG PO TABS
75.0000 ug | ORAL_TABLET | Freq: Every day | ORAL | Status: DC
  Administered 2018-07-06 – 2018-07-09 (×3): 75 ug via ORAL
  Filled 2018-07-05 (×3): qty 1

## 2018-07-05 MED ORDER — ENOXAPARIN SODIUM 40 MG/0.4ML ~~LOC~~ SOLN
40.0000 mg | Freq: Two times a day (BID) | SUBCUTANEOUS | Status: DC
  Administered 2018-07-05 – 2018-07-09 (×8): 40 mg via SUBCUTANEOUS
  Filled 2018-07-05 (×8): qty 0.4

## 2018-07-05 MED ORDER — GLIMEPIRIDE 2 MG PO TABS
4.0000 mg | ORAL_TABLET | Freq: Every day | ORAL | Status: DC
  Administered 2018-07-06 – 2018-07-08 (×3): 4 mg via ORAL
  Filled 2018-07-05 (×3): qty 2
  Filled 2018-07-05: qty 1

## 2018-07-05 MED ORDER — INSULIN GLARGINE 100 UNIT/ML ~~LOC~~ SOLN
17.0000 [IU] | Freq: Every day | SUBCUTANEOUS | Status: DC
  Administered 2018-07-05 – 2018-07-08 (×4): 17 [IU] via SUBCUTANEOUS
  Filled 2018-07-05 (×5): qty 0.17

## 2018-07-05 MED ORDER — ONDANSETRON HCL 4 MG/2ML IJ SOLN: 4.0000 mg | Freq: Four times a day (QID) | INTRAMUSCULAR | Status: DC | PRN

## 2018-07-05 MED ORDER — INSULIN ASPART 100 UNIT/ML ~~LOC~~ SOLN
0.0000 [IU] | Freq: Every day | SUBCUTANEOUS | Status: DC
  Administered 2018-07-08: 2 [IU] via SUBCUTANEOUS
  Filled 2018-07-05: qty 1

## 2018-07-05 MED ORDER — FUROSEMIDE 20 MG PO TABS: 10.0000 mg | ORAL_TABLET | Freq: Every day | ORAL | Status: DC

## 2018-07-05 MED ORDER — SODIUM CHLORIDE 0.9 % IV BOLUS
500.0000 mL | Freq: Once | INTRAVENOUS | Status: AC
  Administered 2018-07-05: 500 mL via INTRAVENOUS

## 2018-07-05 MED ORDER — PANTOPRAZOLE SODIUM 40 MG PO TBEC
40.0000 mg | DELAYED_RELEASE_TABLET | Freq: Every day | ORAL | Status: DC
  Administered 2018-07-05 – 2018-07-09 (×5): 40 mg via ORAL
  Filled 2018-07-05 (×5): qty 1

## 2018-07-05 MED ORDER — NITROGLYCERIN 0.4 MG SL SUBL: 0.4000 mg | SUBLINGUAL_TABLET | SUBLINGUAL | Status: DC | PRN

## 2018-07-05 MED ORDER — METOPROLOL SUCCINATE ER 50 MG PO TB24
100.0000 mg | ORAL_TABLET | Freq: Every day | ORAL | Status: DC
  Administered 2018-07-07: 100 mg via ORAL
  Filled 2018-07-05 (×2): qty 2

## 2018-07-05 MED ORDER — SIMVASTATIN 20 MG PO TABS
40.0000 mg | ORAL_TABLET | Freq: Every evening | ORAL | Status: DC
  Administered 2018-07-05 – 2018-07-08 (×4): 40 mg via ORAL
  Filled 2018-07-05 (×4): qty 2

## 2018-07-05 MED ORDER — FUROSEMIDE 10 MG/ML IJ SOLN
20.0000 mg | Freq: Two times a day (BID) | INTRAMUSCULAR | Status: DC
  Administered 2018-07-05 – 2018-07-08 (×5): 20 mg via INTRAVENOUS
  Filled 2018-07-05 (×5): qty 4

## 2018-07-05 MED ORDER — IPRATROPIUM-ALBUTEROL 0.5-2.5 (3) MG/3ML IN SOLN
3.0000 mL | Freq: Four times a day (QID) | RESPIRATORY_TRACT | Status: DC
  Administered 2018-07-05: 3 mL via RESPIRATORY_TRACT
  Filled 2018-07-05: qty 3

## 2018-07-05 MED ORDER — ADULT MULTIVITAMIN W/MINERALS CH
1.0000 | ORAL_TABLET | Freq: Every day | ORAL | Status: DC
  Administered 2018-07-06 – 2018-07-09 (×4): 1 via ORAL
  Filled 2018-07-05 (×4): qty 1

## 2018-07-05 MED ORDER — ACETAMINOPHEN 650 MG RE SUPP: 650.0000 mg | Freq: Four times a day (QID) | RECTAL | Status: DC | PRN

## 2018-07-05 MED ORDER — SODIUM CHLORIDE 0.9 % IV BOLUS
1000.0000 mL | Freq: Once | INTRAVENOUS | Status: AC
  Administered 2018-07-05: 1000 mL via INTRAVENOUS

## 2018-07-05 NOTE — Progress Notes (Signed)
Anticoagulation monitoring(Lovenox):  84 yo female ordered Lovenox 40 mg Q24h  Filed Weights   07/05/18 1025 07/05/18 2159  Weight: 216 lb 14.9 oz (98.4 kg) 220 lb 6.4 oz (100 kg)   BMI 41.7    Lab Results  Component Value Date   CREATININE 0.97 07/05/2018   CREATININE 1.02 01/01/2018   CREATININE 0.96 10/09/2017   Estimated Creatinine Clearance: 47.7 mL/min (by C-G formula based on SCr of 0.97 mg/dL). Hemoglobin & Hematocrit     Component Value Date/Time   HGB 8.9 (L) 07/05/2018 1034   HGB 12.7 07/16/2015 1520   HCT 30.6 (L) 07/05/2018 1034   HCT 39.0 07/16/2015 1520     Per Protocol for Patient with estCrcl > 30 ml/min and BMI > 40, will transition to Lovenox 40 mg Q12h.

## 2018-07-05 NOTE — ED Notes (Signed)
Pt is AOx4, she is in bed with rails up with complaints of nausea and vomiting. She denies any chest pain, headache or dizziness at this time. She is on the monitor and family is at the bedside. We will continue the patient.

## 2018-07-05 NOTE — H&P (Addendum)
[facility] NAME: Diane Mullins    MR#:  [id number]  DATE OF BIRTH:  06/07/1933  DATE OF ADMISSION:  07/05/2018  PRIMARY CARE PHYSICIAN: [name], MD   REQUESTING/REFERRING PHYSICIAN:   CHIEF COMPLAINT:   Chief Complaint  Patient presents with  . Emesis  . Diarrhea    HISTORY OF PRESENT ILLNESS: Diane Mullins  is a 84 y.o. female with a known history per below, presenting from nursing facility with two 3-day history of nausea/vomiting/diarrhea, dizziness, in route to the emergency room patient was found to be satting 70% on room air by EMS, noted tachycardia, patient had not been able to eat well over the last couple days, complains of generalized weakness, fatigue, in the emergency room patient was found to have glucose 225, hemoglobin 8.9, urinalysis abnormal/cannot rule out infection, CT abdomen noted for 7.4 cm left ovarian mass/large sliding hernia, CT chest noted for edema/pulmonary hypertension, patient evaluated in the emergency room, no apparent distress, resting comfortably in bed, patient denies ever being short of breath, denies chest pain, patient is now being admitted for acute hypoxic respiratory failure, probable acute viral gastroenteritis, and suspected acute on chronic diastolic congestive heart failure.  PAST MEDICAL HISTORY:   Past Medical History:  Diagnosis Date  . Benign paroxysmal positional vertigo 07/17/2014  . Diabetes mellitus   . H/O: rheumatic fever   . Hyperlipidemia   . Hypertension   . Stroke [facility]    TIA Jan. 1st  . Thyroid disease     PAST SURGICAL HISTORY:  Past Surgical History:  Procedure Laterality Date  . ABDOMINAL HYSTERECTOMY  1973   menorrhagia  . HERNIA REPAIR  1994  . ORIF ANKLE FRACTURE Right 10/29/2015   Procedure: OPEN REDUCTION INTERNAL FIXATION (ORIF) ANKLE FRACTURE;  Surgeon: Thornton Park, MD;  Location: [facility] ORS;  Service: Orthopedics;  Laterality: Right;  Marland Kitchen VAGINAL  DELIVERY     x4    SOCIAL HISTORY:  Social History   Tobacco Use  . Smoking status: Never Smoker  . Smokeless tobacco: Never Used  Substance Use Topics  . Alcohol use: No    FAMILY HISTORY:  Family History  Problem Relation Age of Onset  . Heart disease Mother 47  . Heart attack Mother 46  . Cancer Father        colon  . Diabetes Sister   . Hypertension Sister   . Cancer Brother        kidney  . Diabetes Brother   . Cancer Daughter        ovarian  . Non-Hodgkin's lymphoma Son     DRUG ALLERGIES:  Allergies  Allergen Reactions  . Oxycodone     Other reaction(s): Confusion Patient's daughter reported confusion/sensitivity to oxycodone.     REVIEW OF SYSTEMS:   CONSTITUTIONAL: No fever, +fatigue, weakness.  EYES: No blurred or double vision.  EARS, NOSE, AND THROAT: No tinnitus or ear pain.  RESPIRATORY: No cough, shortness of breath, wheezing or hemoptysis.  CARDIOVASCULAR: No chest pain, orthopnea, edema.  GASTROINTESTINAL: + nausea, vomiting, diarrhea  GENITOURINARY: No dysuria, hematuria.  ENDOCRINE: No polyuria, nocturia,  HEMATOLOGY: No anemia, easy bruising or bleeding SKIN: No rash or lesion. MUSCULOSKELETAL: No joint pain or arthritis.   NEUROLOGIC: No tingling, numbness, weakness.  PSYCHIATRY: No anxiety or depression.   MEDICATIONS AT HOME:  Prior to Admission medications   Medication Sig Start Date End Date Taking? Authorizing Provider  aspirin EC 81  MG tablet Take 81 mg by mouth daily.   Yes [provider]  furosemide (LASIX) 20 MG tablet Take 1/2 tablet by mouth once daily Patient taking differently: Take 10 mg by mouth daily.  07/03/18  Yes [name], MD  glimepiride (AMARYL) 4 MG tablet Take 1 tablet by mouth every morning and 1/2 tablet every evening 04/09/18  Yes [name], MD  glucose blood (ONE TOUCH ULTRA TEST) test strip Use as instructed to check Blood sugar three times a day.  E11.65 Dispense patient  choice. 03/29/16  Yes Jackolyn Confer, MD  Insulin Glargine (LANTUS SOLOSTAR) 100 UNIT/ML Solostar Pen INJECT  17 UNITS SUBCUTANEOUSLY IN THE MORNING 04/09/18  Yes [name], MD  Insulin Pen Needle (PEN NEEDLES) 32G X 4 MM MISC Inject 1 Dose into the skin daily. 12/19/16  Yes [name], MD  levothyroxine (SYNTHROID, LEVOTHROID) 75 MCG tablet Take 1 tablet (75 mcg total) by mouth daily. 04/09/18  Yes [name], MD  losartan (COZAAR) 50 MG tablet Take 1 tablet (50 mg total) by mouth daily. 04/09/18  Yes [name], MD  metFORMIN (GLUCOPHAGE) 1000 MG tablet Take 1 tablet (1,000 mg total) by mouth 2 (two) times daily with a meal. TAKE 1 TABLET TWICE DAILY  WITH  A  MEAL 04/09/18  Yes [name], MD  metoprolol succinate (TOPROL-XL) 100 MG 24 hr tablet Take 1 tablet (100 mg total) by mouth daily. 04/09/18  Yes [name], MD  omeprazole (PRILOSEC) 20 MG capsule Take 1 capsule (20 mg total) by mouth daily. 04/09/18  Yes [name], MD  simvastatin (ZOCOR) 40 MG tablet Take 1 tablet (40 mg total) by mouth every evening. 04/09/18  Yes [name], MD  ondansetron (ZOFRAN ODT) 4 MG disintegrating tablet Take 1 tablet (4 mg total) by mouth every 8 (eight) hours as needed for nausea or vomiting. 07/05/18   Carrie Mew, MD  potassium chloride SA (K-DUR,KLOR-CON) 10 MEQ tablet Take 1 tablet (10 mEq total) by mouth daily. Patient not taking: Reported on 07/05/2018 09/25/17   [name], MD      PHYSICAL EXAMINATION:   VITAL SIGNS: Blood pressure (!) 155/68, pulse 94, temperature 98.3 F (36.8 C), resp. rate 17, weight 98.4 kg, SpO2 95 %.  GENERAL:  84 y.o.-year-old patient lying in the bed with no acute distress.  Morbidly obese EYES: Pupils equal, round, reactive to light and accommodation. No scleral icterus. Extraocular muscles intact.  HEENT: Head atraumatic, normocephalic. Oropharynx and nasopharynx clear.  NECK:  Supple, no jugular  venous distention. No thyroid enlargement, no tenderness.  LUNGS: Normal breath sounds bilaterally, no wheezing, rales,rhonchi or crepitation. No use of accessory muscles of respiration.  CARDIOVASCULAR: S1, S2 normal. No murmurs, rubs, or gallops.  ABDOMEN: Soft, nontender, nondistended. Bowel sounds present. No organomegaly or mass.  EXTREMITIES: No pedal edema, cyanosis, or clubbing.  NEUROLOGIC: Cranial nerves II through XII are intact. Muscle strength 5/5 in all extremities. Sensation intact. Gait not checked.  PSYCHIATRIC: The patient is alert and oriented x 3.  SKIN: No obvious rash, lesion, or ulcer.   LABORATORY PANEL:   CBC Recent Labs  Lab 07/05/18 1034  WBC 8.9  HGB 8.9*  HCT 30.6*  PLT 325  MCV 76.5*  MCH 22.3*  MCHC 29.1*  RDW 17.2*   ------------------------------------------------------------------------------------------------------------------  Chemistries  Recent Labs  Lab 07/05/18 1034  NA 139  K 3.8  CL 103  CO2 26  GLUCOSE 225*  BUN 10  CREATININE 0.97  CALCIUM 8.5*  AST 20  ALT 11  ALKPHOS 91  BILITOT 0.4   ------------------------------------------------------------------------------------------------------------------ estimated creatinine clearance is 47.2 mL/min (by C-G formula based on SCr of 0.97 mg/dL). ------------------------------------------------------------------------------------------------------------------ No results for input(s): TSH, T4TOTAL, T3FREE, THYROIDAB in the last 72 hours.  Invalid input(s): FREET3   Coagulation profile No results for input(s): INR, PROTIME in the last 168 hours. ------------------------------------------------------------------------------------------------------------------- No results for input(s): DDIMER in the last 72 hours. -------------------------------------------------------------------------------------------------------------------  Cardiac Enzymes No results for input(s): CKMB,  TROPONINI, MYOGLOBIN in the last 168 hours.  Invalid input(s): CK ------------------------------------------------------------------------------------------------------------------ Invalid input(s): POCBNP  ---------------------------------------------------------------------------------------------------------------  Urinalysis    Component Value Date/Time   COLORURINE YELLOW (A) 07/05/2018 1028   APPEARANCEUR HAZY (A) 07/05/2018 1028   LABSPEC 1.044 (H) 07/05/2018 1028   PHURINE 5.0 07/05/2018 1028   GLUCOSEU 50 (A) 07/05/2018 1028   HGBUR SMALL (A) 07/05/2018 1028   BILIRUBINUR NEGATIVE 07/05/2018 1028   BILIRUBINUR negative 07/23/2017 1327   KETONESUR NEGATIVE 07/05/2018 1028   PROTEINUR NEGATIVE 07/05/2018 1028   UROBILINOGEN 0.2 07/23/2017 1327   NITRITE NEGATIVE 07/05/2018 1028   LEUKOCYTESUR TRACE (A) 07/05/2018 1028     RADIOLOGY: Ct Chest Wo Contrast  Result Date: 07/05/2018 CLINICAL DATA:  Acute respiratory failure. EXAM: CT CHEST WITHOUT CONTRAST TECHNIQUE: Multidetector CT imaging of the chest was performed following the standard protocol without IV contrast. COMPARISON:  Chest radiograph 07/05/2018 and CTA 09/24/2017 FINDINGS: Cardiovascular: Thoracic aortic atherosclerosis without aneurysm. Enlarged main pulmonary artery measuring 3.9 cm in diameter. Extensive coronary artery atherosclerosis. Cardiomegaly. No pericardial effusion. Mediastinum/Nodes: Similar appearance of small mediastinal lymph nodes compared to the prior CT. No gross hilar lymphadenopathy within limitations of noncontrast technique. Large sliding hiatal hernia, slightly larger than on the prior CT. Lungs/Pleura: No pleural effusion or pneumothorax. Unchanged 3 mm left lower lobe nodule (series 3, image 71). Faint ground-glass opacity and minimal interlobular septal thickening bilaterally. Mild dependent atelectasis in the lower lobes. Upper Abdomen: More fully evaluated on earlier abdomen and pelvis CT.  Musculoskeletal: No acute osseous abnormality or suspicious osseous lesion. IMPRESSION: 1. Suspect mild pulmonary edema. 2. Mild dependent atelectasis in the lower lobes. 3. Pulmonary arterial enlargement which can be seen with pulmonary hypertension. 4. Large sliding hiatal hernia. 5. Aortic Atherosclerosis (ICD10-I70.0). Extensive coronary artery atherosclerosis. Electronically Signed   By: Logan Bores M.D.   On: 07/05/2018 16:35   Ct Abdomen Pelvis W Contrast  Result Date: 07/05/2018 CLINICAL DATA:  Acute generalized abdominal pain. EXAM: CT ABDOMEN AND PELVIS WITH CONTRAST TECHNIQUE: Multidetector CT imaging of the abdomen and pelvis was performed using the standard protocol following bolus administration of intravenous contrast. CONTRAST:  182mL ISOVUE-300 IOPAMIDOL (ISOVUE-300) INJECTION 61% COMPARISON:  None. FINDINGS: Lower chest: Large hiatal hernia is noted. Visualized lung bases are unremarkable. Hepatobiliary: Cholelithiasis is noted. No biliary dilatation is noted. No abnormality seen in the liver. Pancreas: Unremarkable. No pancreatic ductal dilatation or surrounding inflammatory changes. Spleen: Normal in size without focal abnormality. Adrenals/Urinary Tract: Adrenal glands are unremarkable. Kidneys are normal, without renal calculi, focal lesion, or hydronephrosis. Bladder is unremarkable. Stomach/Bowel: Stomach appears normal. There is no evidence of bowel obstruction or inflammation. Sigmoid diverticulosis is noted without inflammation. Appendix is not visualized. Vascular/Lymphatic: Aortic atherosclerosis. No enlarged abdominal or pelvic lymph nodes. Reproductive: Status post hysterectomy. 7.4 cm fat containing mass is noted in left adnexal region consistent with dermoid tumor. Other: No abdominal wall hernia or abnormality. No abdominopelvic ascites. Musculoskeletal: No  acute or significant osseous findings. IMPRESSION: Large sliding-type hiatal hernia. 7.4 cm fat containing mass seen in  left adnexal region consistent with left ovarian dermoid tumor. Sigmoid diverticulosis without inflammation. Aortic Atherosclerosis (ICD10-I70.0). Electronically Signed   By: Marijo Conception, M.D.   On: 07/05/2018 13:43   Dg Chest Portable 1 View  Result Date: 07/05/2018 CLINICAL DATA:  Weakness and hypoxia per ordering notes. Patient unable to give adequate history. Per ED notes- Pt to ED via [facility] from [facility] for N/V/D. Upon EMS arrival pt c/o dizziness and appears pale. Pt has had N/V/D x 2-3 days. EMS states that initial O2 sats were in the 70's and pt was tachycardic. Hx of HTN and diabetes. Non-smoker. EXAM: PORTABLE CHEST - 1 VIEW COMPARISON:  09/24/2017 FINDINGS: Persistent pulmonary vascular congestion. Diffuse interstitial prominence with some improvement in interstitial edema. Stable cardiomegaly. No definite effusion. Visualized bones unremarkable. IMPRESSION: 1. Cardiomegaly and central pulmonary vascular congestion with some improvement in interstitial edema. Electronically Signed   By: Lucrezia Europe M.D.   On: 07/05/2018 11:18    EKG: Orders placed or performed during the hospital encounter of 07/05/18  . ED EKG  . ED EKG    IMPRESSION AND PLAN: *Acute hypoxic respiratory failure Most likely secondary to acute on chronic diastolic congestive heart failure exacerbation Admit to regular nursing floor bed, supplemental oxygen wean as tolerated, rule out acute coronary syndrome with cardiac enzymes x3 sets, and continue close medical monitoring  *Acute on chronic congestive heart failure exacerbation Patient currently denies ever being short of breath  Will place on congestive heart failure protocol, check echocardiogram, low-dose IV Lasix twice daily, strict out no monitoring, daily weights, metoprolol, statin therapy, losartan, supplemental oxygen wean as tolerated, rule out acute Corry syndrome as stated above Most recent echocardiogram noted for stage I diastolic  dysfunction  *Acute probable viral gastroenteritis Check GI panel, antiemetics PRN, and continue close medical monitoring  *Acute incidentally found left ovarian mass Noted on CT of the abdomen Consult gynecology for expert opinion  *Acute abnormal urinalysis Denies dysuria Follow-up on urine cultures  *Chronic diabetes mellitus type 2 Sliding scale insulin Accu-Cheks per routine  *Chronic benign essential hypertension Stable on home regiment  *Chronic hypothyroidism, unspecified Check TSH and continue Synthroid  *Anemia Check anemia work-up and treat as indicated  *Chronic morbid obesity Most likely secondary to excess calories Lifestyle modification recommended   All the records are reviewed and case discussed with ED provider. Management plans discussed with the patient, family and they are in agreement.  CODE STATUS:full Code Status History    Date Active Date Inactive Code Status Order ID Comments User Context   10/28/2015 1501 10/29/2015 1718 Full Code [id number]  Henreitta Leber, MD Inpatient   09/18/2015 1639 09/20/2015 2243 Full Code [id number]  Gladstone Lighter, MD Inpatient    Advance Directive Documentation     Most Recent Value  Type of Advance Directive  Healthcare Power of Attorney  Pre-existing out of facility DNR order (yellow form or pink MOST form)  -  "MOST" Form in Place?  -       TOTAL TIME TAKING CARE OF THIS PATIENT: 40 minutes.    Avel Peace Salary M.D on 07/05/2018   Between 7am to 6pm - Pager - [phone]  After 6pm go to [email or web address] - password [email or web address] [facility]  Office  [phone]  CC: Primary care physician; [name], MD   Note: This dictation was prepared with  Dragon dictation along with smaller Company secretary. Any transcriptional errors that result from this process are unintentional.

## 2018-07-05 NOTE — ED Provider Notes (Addendum)
[facility] Emergency Department Provider Note  ____________________________________________  Time seen: Approximately 12:34 PM  I have reviewed the triage vital signs and the nursing notes.   HISTORY  Chief Complaint Emesis and Diarrhea    HPI KENSI KARR is a 84 y.o. female with a history of diabetes hypertension hyperlipidemia who complains of diarrhea nausea vomiting and generalized weakness over the past 2 days.  Unable to eat or drink anything.  Feels too weak to get out of bed.  No black or bloody stool.  She and is in a nursing facility with many other chronically ill patients.  EMS found that the patient was hypoxic on initial assessment.  Patient denies chest pain or shortness of breath.  She has received her flu vaccination this season.   Denies abdominal pain.  EMS report that on their arrival the patient was hypoxic with sats in the mid 70s.   Past Medical History:  Diagnosis Date  . Benign paroxysmal positional vertigo 07/17/2014  . Diabetes mellitus   . H/O: rheumatic fever   . Hyperlipidemia   . Hypertension   . Stroke [facility])    TIA Jan. 1st  . Thyroid disease      Patient Active Problem List   Diagnosis Date Noted  . Paroxysmal tachycardia ([place]) 11/16/2017  . Coronary artery calcification seen on CAT scan 11/16/2017  . Morbid obesity ([place]) 10/30/2017  . (HFpEF) heart failure with preserved ejection fraction ([place]) 09/28/2017  . Family history of colon cancer 04/17/2017  . Closed right ankle fracture 10/28/2015  . Fatigue 10/18/2015  . TIA (transient ischemic attack) 09/18/2015  . Orthostatic hypotension 08/02/2015  . Right shoulder injury 08/02/2015  . Adjustment disorder with anxious mood 03/30/2015  . Postmenopausal estrogen deficiency 06/03/2014  . DNR (do not resuscitate) discussion 03/04/2014  . Gait disturbance 05/07/2013  . Diabetes ([place]) 04/21/2013  . Shortness of breath 12/09/2012  . Hearing loss 12/09/2012  .  Anxiety 01/31/2012  . Hypertension 12/22/2011  . Hyperlipidemia 12/22/2011  . Hypothyroidism 12/22/2011     Past Surgical History:  Procedure Laterality Date  . ABDOMINAL HYSTERECTOMY  1973   menorrhagia  . HERNIA REPAIR  1994  . ORIF ANKLE FRACTURE Right 10/29/2015   Procedure: OPEN REDUCTION INTERNAL FIXATION (ORIF) ANKLE FRACTURE;  Surgeon: Thornton Park, MD;  Location: [facility] ORS;  Service: Orthopedics;  Laterality: Right;  Marland Kitchen VAGINAL DELIVERY     x4     Prior to Admission medications   Medication Sig Start Date End Date Taking? Authorizing Provider  aspirin EC 81 MG tablet Take 81 mg by mouth daily.    [provider]  furosemide (LASIX) 20 MG tablet Take 1/2 tablet by mouth once daily 07/03/18   [name], MD  glimepiride (AMARYL) 4 MG tablet Take 1 tablet by mouth every morning and 1/2 tablet every evening 04/09/18   [name], MD  glucose blood (ONE TOUCH ULTRA TEST) test strip Use as instructed to check Blood sugar three times a day.  E11.65 Dispense patient choice. 03/29/16   Jackolyn Confer, MD  Insulin Glargine (LANTUS SOLOSTAR) 100 UNIT/ML Solostar Pen INJECT  17 UNITS SUBCUTANEOUSLY IN THE MORNING 04/09/18   [name], MD  Insulin Pen Needle (PEN NEEDLES) 32G X 4 MM MISC Inject 1 Dose into the skin daily. 12/19/16   [name], MD  ketoconazole (NIZORAL) 2 % cream Apply 1 application daily topically. 07/23/17   [name], MD  levothyroxine (SYNTHROID,  LEVOTHROID) 75 MCG tablet Take 1 tablet (75 mcg total) by mouth daily. 04/09/18   [name], MD  losartan (COZAAR) 50 MG tablet Take 1 tablet (50 mg total) by mouth daily. 04/09/18   [name], MD  metFORMIN (GLUCOPHAGE) 1000 MG tablet Take 1 tablet (1,000 mg total) by mouth 2 (two) times daily with a meal. TAKE 1 TABLET TWICE DAILY  WITH  A  MEAL 04/09/18   [name], MD  metoprolol succinate (TOPROL-XL) 100 MG 24 hr tablet Take 1 tablet (100 mg total)  by mouth daily. 04/09/18   [name], MD  omeprazole (PRILOSEC) 20 MG capsule Take 1 capsule (20 mg total) by mouth daily. 04/09/18   [name], MD  ondansetron (ZOFRAN ODT) 4 MG disintegrating tablet Take 1 tablet (4 mg total) by mouth every 8 (eight) hours as needed for nausea or vomiting. 07/05/18   Carrie Mew, MD  potassium chloride SA (K-DUR,KLOR-CON) 10 MEQ tablet Take 1 tablet (10 mEq total) by mouth daily. 09/25/17   [name], MD  simvastatin (ZOCOR) 40 MG tablet Take 1 tablet (40 mg total) by mouth every evening. 04/09/18   [name], MD     Allergies Oxycodone   Family History  Problem Relation Age of Onset  . Heart disease Mother 55  . Heart attack Mother 11  . Cancer Father        colon  . Diabetes Sister   . Hypertension Sister   . Cancer Brother        kidney  . Diabetes Brother   . Cancer Daughter        ovarian  . Non-Hodgkin's lymphoma Son     Social History Social History   Tobacco Use  . Smoking status: Never Smoker  . Smokeless tobacco: Never Used  Substance Use Topics  . Alcohol use: No  . Drug use: No    Review of Systems  Constitutional:   No fever positive chills.  ENT:   No sore throat. No rhinorrhea. Cardiovascular:   No chest pain or syncope. Respiratory:   No dyspnea or cough. Gastrointestinal:   Negative for abdominal pain, positive vomiting and diarrhea.  Musculoskeletal:   Negative for focal pain or swelling All other systems reviewed and are negative except as documented above in ROS and HPI.  ____________________________________________   PHYSICAL EXAM:  VITAL SIGNS: ED Triage Vitals  Enc Vitals Group     BP 07/05/18 1028 (!) 117/53     Pulse Rate 07/05/18 1028 89     Resp 07/05/18 1028 (!) 22     Temp 07/05/18 1028 98.3 F (36.8 C)     Temp Source 07/05/18 1028 Oral     SpO2 07/05/18 1028 96 %     Weight 07/05/18 1025 216 lb 14.9 oz (98.4 kg)     Height --      Head Circumference  --      Peak Flow --      Pain Score 07/05/18 1025 0     Pain Loc --      Pain Edu? --      Excl. in [place]? --     Vital signs reviewed, nursing assessments reviewed.   Constitutional:   Alert and oriented.  Ill-appearing. Eyes:   Conjunctivae are normal. EOMI. PERRL. ENT      Head:   Normocephalic and atraumatic.      Nose:   No congestion/rhinnorhea.  Mouth/Throat:   Dry mucous membranes, no pharyngeal erythema. No peritonsillar mass.       Neck:   No meningismus. Full ROM. Hematological/Lymphatic/Immunilogical:   No cervical lymphadenopathy. Cardiovascular:   RRR. Symmetric bilateral radial and DP pulses.  No murmurs. Cap refill less than 2 seconds. Respiratory:   Normal respiratory effort without tachypnea/retractions. Breath sounds are clear and equal bilaterally. No wheezes/rales/rhonchi.  On room air oxygen saturation drops to 85%. Gastrointestinal:   Soft and nontender. Non distended. There is no CVA tenderness.  No rebound, rigidity, or guarding.  Rectal exam performed with nurse Sam at bedside, Hemoccult negative.  Musculoskeletal:   Normal range of motion in all extremities. No joint effusions.  No lower extremity tenderness.  No edema. Neurologic:   Normal speech and language.  Motor grossly intact. No acute focal neurologic deficits are appreciated.  Skin:    Skin is warm, dry and intact. No rash noted.  No petechiae, purpura, or bullae.  ____________________________________________    LABS (pertinent positives/negatives) (all labs ordered are listed, but only abnormal results are displayed) Labs Reviewed  COMPREHENSIVE METABOLIC PANEL - Abnormal; Notable for the following components:      Result Value   Glucose, Bld 225 (*)    Calcium 8.5 (*)    Total Protein 6.3 (*)    Albumin 3.3 (*)    GFR calc non Af Amer 53 (*)    All other components within normal limits  CBC - Abnormal; Notable for the following components:   Hemoglobin 8.9 (*)    HCT 30.6 (*)     MCV 76.5 (*)    MCH 22.3 (*)    MCHC 29.1 (*)    RDW 17.2 (*)    All other components within normal limits  URINALYSIS, COMPLETE (UACMP) WITH MICROSCOPIC - Abnormal; Notable for the following components:   Color, Urine YELLOW (*)    APPearance HAZY (*)    Specific Gravity, Urine 1.044 (*)    Glucose, UA 50 (*)    Hgb urine dipstick SMALL (*)    Leukocytes, UA TRACE (*)    Bacteria, UA RARE (*)    All other components within normal limits  LIPASE, BLOOD  INFLUENZA PANEL BY PCR (TYPE A & B)   ____________________________________________   EKG  Interpreted by me Sinus rhythm rate of 85, normal axis and intervals.  Normal ST segments and T waves.  LVH in the high lateral leads with associated repolarization of normality.  ____________________________________________    RADIOLOGY  Ct Abdomen Pelvis W Contrast  Result Date: 07/05/2018 CLINICAL DATA:  Acute generalized abdominal pain. EXAM: CT ABDOMEN AND PELVIS WITH CONTRAST TECHNIQUE: Multidetector CT imaging of the abdomen and pelvis was performed using the standard protocol following bolus administration of intravenous contrast. CONTRAST:  164mL ISOVUE-300 IOPAMIDOL (ISOVUE-300) INJECTION 61% COMPARISON:  None. FINDINGS: Lower chest: Large hiatal hernia is noted. Visualized lung bases are unremarkable. Hepatobiliary: Cholelithiasis is noted. No biliary dilatation is noted. No abnormality seen in the liver. Pancreas: Unremarkable. No pancreatic ductal dilatation or surrounding inflammatory changes. Spleen: Normal in size without focal abnormality. Adrenals/Urinary Tract: Adrenal glands are unremarkable. Kidneys are normal, without renal calculi, focal lesion, or hydronephrosis. Bladder is unremarkable. Stomach/Bowel: Stomach appears normal. There is no evidence of bowel obstruction or inflammation. Sigmoid diverticulosis is noted without inflammation. Appendix is not visualized. Vascular/Lymphatic: Aortic atherosclerosis. No enlarged  abdominal or pelvic lymph nodes. Reproductive: Status post hysterectomy. 7.4 cm fat containing mass is noted in left adnexal region consistent  with dermoid tumor. Other: No abdominal wall hernia or abnormality. No abdominopelvic ascites. Musculoskeletal: No acute or significant osseous findings. IMPRESSION: Large sliding-type hiatal hernia. 7.4 cm fat containing mass seen in left adnexal region consistent with left ovarian dermoid tumor. Sigmoid diverticulosis without inflammation. Aortic Atherosclerosis (ICD10-I70.0). Electronically Signed   By: Marijo Conception, M.D.   On: 07/05/2018 13:43   Dg Chest Portable 1 View  Result Date: 07/05/2018 CLINICAL DATA:  Weakness and hypoxia per ordering notes. Patient unable to give adequate history. Per ED notes- Pt to ED via [facility] from [facility] for [facility]V/D. Upon EMS arrival pt c/o dizziness and appears pale. Pt has had N/V/D x 2-3 days. EMS states that initial O2 sats were in the 70's and pt was tachycardic. Hx of HTN and diabetes. Non-smoker. EXAM: PORTABLE CHEST - 1 VIEW COMPARISON:  09/24/2017 FINDINGS: Persistent pulmonary vascular congestion. Diffuse interstitial prominence with some improvement in interstitial edema. Stable cardiomegaly. No definite effusion. Visualized bones unremarkable. IMPRESSION: 1. Cardiomegaly and central pulmonary vascular congestion with some improvement in interstitial edema. Electronically Signed   By: Lucrezia Europe M.D.   On: 07/05/2018 11:18    ____________________________________________   PROCEDURES Procedures  ____________________________________________  DIFFERENTIAL DIAGNOSIS   Viral syndrome/influenza-like illness, pneumonia, pneumothorax, diverticulitis  CLINICAL IMPRESSION / ASSESSMENT AND PLAN / ED COURSE  Pertinent labs & imaging results that were available during my care of the patient were reviewed by me and considered in my medical decision making (see chart for details).    Patient presents with diarrhea  vomiting and dizziness, most likely a viral syndrome.  However with her age and comorbidities I will obtain a CT scan of the abdomen to evaluate for diverticulitis or other intra-abdominal complication.  She denies shortness of breath, but is found to be hypoxic on room air to 85% which I confirmed on my exam.  Clinical Course as of Jul 05 1605  Fri Jul 05, 2018  1556 Discussed with hospitalist, on their evaluation patient is not hypoxic and well-appearing and they recommend outpatient follow-up.  Will reassess her room air oxygen saturation and tolerance of oral intake.   [PS]    Clinical Course User Index [PS] Carrie Mew, MD    ----------------------------------------- 4:03 PM on 07/05/2018 -----------------------------------------  Nurse confirms that off of nasal cannula oxygen, her saturation drops to 85%.   ____________________________________________   FINAL CLINICAL IMPRESSION(S) / ED DIAGNOSES    Final diagnoses:  Diarrhea of presumed infectious origin  Generalized weakness  Influenza-like illness  Aortic atherosclerosis ([place])  Diverticulosis  Dermoid cyst of left ovary  Acute respiratory failure with hypoxia [facility])     ED Discharge Orders         Ordered    ondansetron (ZOFRAN ODT) 4 MG disintegrating tablet  Every 8 hours PRN     07/05/18 1600          Portions of this note were generated with dragon dictation software. Dictation errors may occur despite best attempts at proofreading.    Carrie Mew, MD 07/05/18 1604    Carrie Mew, MD 07/05/18 [phone]

## 2018-07-05 NOTE — ED Notes (Signed)
Rebecca RN, aware of bed assigned 

## 2018-07-05 NOTE — Progress Notes (Signed)
Notified daughter [name] of room number and password

## 2018-07-05 NOTE — ED Notes (Signed)
This EDT put pt on bedpan

## 2018-07-05 NOTE — ED Triage Notes (Signed)
Pt to ED via [facility] from [facility] for N/V/D. Upon EMS arrival pt c/o dizziness and appears pale. Pt has had N/V/D x 2-3 days. EMS states that initial O2 sats were in the 70's and pt was tachycardic. Pt was started on 4 liter O2 with improvement in sats to 96%. Pt CBG 286. EMS gave pt 4 mg of zofran

## 2018-07-05 NOTE — ED Notes (Signed)
Diane Mullins [phone] neighbor/friend

## 2018-07-05 NOTE — Discharge Instructions (Addendum)
Your labs today show some dehydration.  Your CT scan shows a large dermoid tumor of the left ovary.  You should follow-up with gynecology for further evaluation.  Continue taking nausea medicine and Imodium as needed.  CT summary: Large sliding-type hiatal hernia.   7.4 cm fat containing mass seen in left adnexal region consistent with left ovarian dermoid tumor.   Sigmoid diverticulosis without inflammation.   Aortic Atherosclerosis (ICD10-I70.0).

## 2018-07-05 NOTE — Progress Notes (Signed)
Family Meeting Note  Advance Directive:yes  Today a meeting took place with the Patient.  Patient is able to participate   The following clinical team members were present during this meeting:MD  The following were discussed:Patient's diagnosis: Respiratory failure, Patient's progosis: Unable to determine and Goals for treatment: Full Code  Additional follow-up to be provided: prn  Time spent during discussion:20 minutes  Gorden Harms, MD

## 2018-07-05 NOTE — ED Notes (Signed)
Report given to Jenn RN

## 2018-07-06 ENCOUNTER — Inpatient Hospital Stay (HOSPITAL_COMMUNITY)
Admit: 2018-07-06 | Discharge: 2018-07-06 | Disposition: A | Discharge status: Home / Self Care | Payer: PPO | Attending: Family Medicine | Admitting: Family Medicine

## 2018-07-06 DIAGNOSIS — D649 Anemia, unspecified: Secondary | ICD-10-CM

## 2018-07-06 DIAGNOSIS — I5030 Unspecified diastolic (congestive) heart failure: Secondary | ICD-10-CM

## 2018-07-06 DIAGNOSIS — D271 Benign neoplasm of left ovary: Secondary | ICD-10-CM

## 2018-07-06 DIAGNOSIS — I5033 Acute on chronic diastolic (congestive) heart failure: Secondary | ICD-10-CM

## 2018-07-06 LAB — LIPID PANEL
CHOL/HDL RATIO: 3.7 ratio
Cholesterol: 130 mg/dL (ref 0–200)
HDL: 35 mg/dL — AB (ref >40)
LDL Cholesterol: 74 mg/dL (ref 0–99)
Triglycerides: 107 mg/dL (ref <150)
VLDL: 21 mg/dL (ref 0–40)

## 2018-07-06 LAB — BASIC METABOLIC PANEL
ANION GAP: 8 (ref 5–15)
BUN: 10 mg/dL (ref 8–23)
CHLORIDE: 103 mmol/L (ref 98–111)
CO2: 30 mmol/L (ref 22–32)
Calcium: 8.3 mg/dL — ABNORMAL LOW (ref 8.9–10.3)
Creatinine, Ser: 0.93 mg/dL (ref 0.44–1.00)
GFR calc non Af Amer: 55 mL/min — ABNORMAL LOW (ref >60)
Glucose, Bld: 107 mg/dL — ABNORMAL HIGH (ref 70–99)
POTASSIUM: 3.9 mmol/L (ref 3.5–5.1)
SODIUM: 141 mmol/L (ref 135–145)

## 2018-07-06 LAB — CBC
HCT: 28.6 % — ABNORMAL LOW (ref 36.0–46.0)
HEMATOCRIT: 29 % — AB (ref 36.0–46.0)
HEMOGLOBIN: 8.1 g/dL — AB (ref 12.0–15.0)
HEMOGLOBIN: 8.3 g/dL — AB (ref 12.0–15.0)
MCH: 22 pg — AB (ref 26.0–34.0)
MCH: 22.3 pg — AB (ref 26.0–34.0)
MCHC: 28.3 g/dL — ABNORMAL LOW (ref 30.0–36.0)
MCHC: 28.6 g/dL — AB (ref 30.0–36.0)
MCV: 77.7 fL — AB (ref 80.0–100.0)
MCV: 77.7 fL — ABNORMAL LOW (ref 80.0–100.0)
PLATELETS: 289 10*3/uL (ref 150–400)
Platelets: 285 10*3/uL (ref 150–400)
RBC: 3.68 MIL/uL — AB (ref 3.87–5.11)
RBC: 3.73 MIL/uL — ABNORMAL LOW (ref 3.87–5.11)
RDW: 17.2 % — ABNORMAL HIGH (ref 11.5–15.5)
RDW: 17.3 % — ABNORMAL HIGH (ref 11.5–15.5)
WBC: 7.1 10*3/uL (ref 4.0–10.5)
WBC: 7.3 10*3/uL (ref 4.0–10.5)
nRBC: 0 % (ref 0.0–0.2)
nRBC: 0 % (ref 0.0–0.2)

## 2018-07-06 LAB — ECHOCARDIOGRAM COMPLETE: Weight: 3526.4 oz

## 2018-07-06 LAB — CREATININE, SERUM
Creatinine, Ser: 0.92 mg/dL (ref 0.44–1.00)
GFR calc Af Amer: >60 mL/min (ref >60)
GFR calc non Af Amer: 56 mL/min — ABNORMAL LOW (ref >60)

## 2018-07-06 LAB — VITAMIN B12

## 2018-07-06 LAB — RETICULOCYTES
Immature Retic Fract: 28.4 % — ABNORMAL HIGH (ref 2.3–15.9)
RBC.: 3.73 MIL/uL — ABNORMAL LOW (ref 3.87–5.11)
RETIC CT PCT: 1.8 % (ref 0.4–3.1)
Retic Count, Absolute: 65.3 10*3/uL (ref 19.0–186.0)

## 2018-07-06 LAB — TSH: TSH: 0.888 u[IU]/mL (ref 0.350–4.500)

## 2018-07-06 LAB — GLUCOSE, CAPILLARY
GLUCOSE-CAPILLARY: 151 mg/dL — AB (ref 70–99)
Glucose-Capillary: 108 mg/dL — ABNORMAL HIGH (ref 70–99)
Glucose-Capillary: 209 mg/dL — ABNORMAL HIGH (ref 70–99)
Glucose-Capillary: 132 mg/dL — ABNORMAL HIGH (ref 70–99)

## 2018-07-06 LAB — IRON AND TIBC
Iron: 16 ug/dL — ABNORMAL LOW (ref 28–170)
Saturation Ratios: 4 % — ABNORMAL LOW (ref 10.4–31.8)
TIBC: 378 ug/dL (ref 250–450)
UIBC: 362 ug/dL

## 2018-07-06 LAB — FERRITIN: Ferritin: 12 ng/mL (ref 11–307)

## 2018-07-06 LAB — TROPONIN I
TROPONIN I: 0.03 ng/mL — AB (ref <0.03)
Troponin I: 0.03 ng/mL (ref <0.03)

## 2018-07-06 LAB — BRAIN NATRIURETIC PEPTIDE: B Natriuretic Peptide: 210 pg/mL — ABNORMAL HIGH (ref 0.0–100.0)

## 2018-07-06 LAB — FOLATE: Folate: 17 ng/mL (ref >5.9)

## 2018-07-06 MED ORDER — SODIUM CHLORIDE 0.9 % IV SOLN
200.0000 mg | INTRAVENOUS | Status: AC
  Administered 2018-07-06 – 2018-07-08 (×4): 200 mg via INTRAVENOUS
  Filled 2018-07-06 (×3): qty 10

## 2018-07-06 MED ORDER — IPRATROPIUM-ALBUTEROL 0.5-2.5 (3) MG/3ML IN SOLN
3.0000 mL | Freq: Two times a day (BID) | RESPIRATORY_TRACT | Status: DC
  Administered 2018-07-06 (×2): 3 mL via RESPIRATORY_TRACT
  Filled 2018-07-06 (×2): qty 3

## 2018-07-06 MED ORDER — RAMELTEON 8 MG PO TABS
8.0000 mg | ORAL_TABLET | Freq: Every day | ORAL | Status: DC
  Administered 2018-07-06 – 2018-07-08 (×3): 8 mg via ORAL
  Filled 2018-07-06 (×4): qty 1

## 2018-07-06 MED ORDER — PROCHLORPERAZINE EDISYLATE 10 MG/2ML IJ SOLN
10.0000 mg | Freq: Four times a day (QID) | INTRAMUSCULAR | Status: DC | PRN
  Filled 2018-07-06: qty 2

## 2018-07-06 MED ORDER — ONDANSETRON HCL 4 MG/2ML IJ SOLN
4.0000 mg | Freq: Four times a day (QID) | INTRAMUSCULAR | Status: AC
  Administered 2018-07-06 – 2018-07-08 (×5): 4 mg via INTRAVENOUS
  Filled 2018-07-06 (×6): qty 2

## 2018-07-06 NOTE — Consult Note (Signed)
Consult Note  Requesting Attending Physician :  Gladstone Lighter, MD   Assessment/Recommendations:  Diane Mullins is a 84 y.o. female seen in consultation at the request of Gladstone Lighter, MD regarding pelvic mass found at CT.  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  It is of significant note that the patient is asymptomatic. A ROMA score was ordered simply for completeness sake to rule out ovarian malignancy. Although not necessary, the best way to image the ovary is by ultrasound and this would further clarify this mass as a teratoma if desired for completeness sake. (Pelvic U/S with doppler flow)  As patient not symptomatic and is somewhat of an increased risk for surgical intervention, thus no reason for removal at this time.  Risk of rupture is low.  Torsion remains a risk but is also not a high risk.     Reason for Consult: Ovarian mass found incidentally at CT.   Surgical History: Patient reports a previous history of hysterectomy.  Obstetric History:  OB History  No data available     Social History: Social History   Socioeconomic History  . Marital status: Widowed    Spouse name: Not on file  . Number of children: 4  . Years of education: Not on file  . Highest education level: Not on file  Occupational History  . Not on file  Social Needs  . Financial resource strain: Not hard at all  . Food insecurity:    Worry: Never true    Inability: Never true  . Transportation needs:    Medical: No    Non-medical: No  Tobacco Use  . Smoking status: Never Smoker  . Smokeless tobacco: Never Used  Substance and Sexual Activity  . Alcohol use: No  . Drug use: No  . Sexual activity: Never  Lifestyle  . Physical activity:    Days per week: 3 days    Minutes per session: 50 min  . Stress: Not at all  Relationships  . Social connections:    Talks on phone: Not on file    Gets  together: Not on file    Attends religious service: Not on file    Active member of club or organization: Not on file    Attends meetings of clubs or organizations: Not on file    Relationship status: Not on file  Other Topics Concern  . Not on file  Social History Narrative   Lives in [place] alone. No pets. Retired from [employer].   Diet: healthy   Exercise: water aerobics twice weekly, balance class   Ambulates with a walker at baseline    Family History: family history includes Cancer in her brother, daughter, and father; Diabetes in her brother and sister; Heart attack (age of onset: 16) in her mother; Heart disease (age of onset: 91) in her mother; Hypertension in her sister; Non-Hodgkin's lymphoma in her son.  Review of Systems: See admission H&P  Objective :  Vital signs in last 24 hours: Temp:  [97.5 F (36.4 C)-98.6 F (37 C)] 98.6 F (37 C) (10/19 0817) Pulse Rate:  [67-94] 89 (10/19 0921) Resp:  [16-19] 19 (10/18 2110) BP: (115-155)/(47-69) 115/47 (10/19 0921) SpO2:  [84 %-100 %] 99 % (10/19 0817) Weight:  [100 kg] 100 kg (10/18 2159)  Intake/Output last 3 shifts: I/O last 3 completed shifts: In: 1000 [IV Piggyback:1000] Out: 600 [Urine:600]   Physical Exam: Abdomen:  Soft nontender no obvious masses palpated no rebound tenderness no guarding  Imaging: Status post hysterectomy. 7.4 cm fat containing mass is noted in left adnexal region consistent with dermoid tumor.  Finis Bud, M.D. 07/06/2018 1:29 PM

## 2018-07-06 NOTE — Progress Notes (Signed)
[place] [facility] [place] NAME: Diane Mullins    MR#:  [id number]  DATE OF BIRTH:  [birth year]  SUBJECTIVE:  CHIEF COMPLAINT:   Chief Complaint  Patient presents with  . Emesis  . Diarrhea   -Patient coming from independent living facility with nausea vomiting and dyspnea. -CT showing pulmonary edema and left ovarian dermoid mass.  REVIEW OF SYSTEMS:  Review of Systems  Constitutional: Negative for chills and fever.  HENT: Negative for congestion, ear discharge, hearing loss and nosebleeds.   Eyes: Negative for blurred vision and double vision.  Respiratory: Positive for shortness of breath. Negative for cough and wheezing.   Cardiovascular: Positive for leg swelling. Negative for chest pain and palpitations.  Gastrointestinal: Positive for nausea. Negative for abdominal pain, constipation, diarrhea and vomiting.  Genitourinary: Negative for dysuria.  Musculoskeletal: Negative for myalgias.  Neurological: Negative for dizziness, focal weakness, seizures, weakness and headaches.  Psychiatric/Behavioral: Negative for depression.    DRUG ALLERGIES:   Allergies  Allergen Reactions  . Oxycodone     Other reaction(s): Confusion Patient's daughter reported confusion/sensitivity to oxycodone.     VITALS:  Blood pressure (!) 115/47, pulse 89, temperature 98.6 F (37 C), temperature source Oral, resp. rate 19, weight 100 kg, SpO2 99 %.  PHYSICAL EXAMINATION:  Physical Exam  GENERAL:  84 y.o.-year-old elderly patient lying in the bed with no acute distress.  EYES: Pupils equal, round, reactive to light and accommodation. No scleral icterus. Extraocular muscles intact.  HEENT: Head atraumatic, normocephalic. Oropharynx and nasopharynx clear.  NECK:  Supple, no jugular venous distention. No thyroid enlargement, no tenderness.  LUNGS: Normal breath sounds bilaterally, no wheezing,  rhonchi or crepitation. Fine bibasilar crackles heard No use  of accessory muscles of respiration.  CARDIOVASCULAR: S1, S2 normal. No  rubs, or gallops. 2/6 systolic murmur present ABDOMEN: Soft, nontender, nondistended. Bowel sounds present. No organomegaly or mass.  EXTREMITIES: No pedal edema, cyanosis, or clubbing.  NEUROLOGIC: Cranial nerves II through XII are intact. Muscle strength 5/5 in all extremities. Sensation intact. Gait not checked. Global weakness noted. PSYCHIATRIC: The patient is alert and oriented x 3.  SKIN: No obvious rash, lesion, or ulcer.    LABORATORY PANEL:   CBC Recent Labs  Lab 07/06/18 0720  WBC 7.3  HGB 8.1*  HCT 28.6*  PLT 289   ------------------------------------------------------------------------------------------------------------------  Chemistries  Recent Labs  Lab 07/05/18 1034  07/06/18 0720  NA 139  --  141  K 3.8  --  3.9  CL 103  --  103  CO2 26  --  30  GLUCOSE 225*  --  107*  BUN 10  --  10  CREATININE 0.97   < > 0.93  CALCIUM 8.5*  --  8.3*  AST 20  --   --   ALT 11  --   --   ALKPHOS 91  --   --   BILITOT 0.4  --   --    < > = values in this interval not displayed.   ------------------------------------------------------------------------------------------------------------------  Cardiac Enzymes Recent Labs  Lab 07/06/18 0720  TROPONINI 0.03*   ------------------------------------------------------------------------------------------------------------------  RADIOLOGY:  Ct Chest Wo Contrast  Result Date: 07/05/2018 CLINICAL DATA:  Acute respiratory failure. EXAM: CT CHEST WITHOUT CONTRAST TECHNIQUE: Multidetector CT imaging of the chest was performed following the standard protocol without IV contrast. COMPARISON:  Chest radiograph 07/05/2018 and CTA 09/24/2017 FINDINGS: Cardiovascular: Thoracic aortic atherosclerosis without aneurysm. Enlarged main pulmonary artery  measuring 3.9 cm in diameter. Extensive coronary artery atherosclerosis. Cardiomegaly. No pericardial effusion.  Mediastinum/Nodes: Similar appearance of small mediastinal lymph nodes compared to the prior CT. No gross hilar lymphadenopathy within limitations of noncontrast technique. Large sliding hiatal hernia, slightly larger than on the prior CT. Lungs/Pleura: No pleural effusion or pneumothorax. Unchanged 3 mm left lower lobe nodule (series 3, image 71). Faint ground-glass opacity and minimal interlobular septal thickening bilaterally. Mild dependent atelectasis in the lower lobes. Upper Abdomen: More fully evaluated on earlier abdomen and pelvis CT. Musculoskeletal: No acute osseous abnormality or suspicious osseous lesion. IMPRESSION: 1. Suspect mild pulmonary edema. 2. Mild dependent atelectasis in the lower lobes. 3. Pulmonary arterial enlargement which can be seen with pulmonary hypertension. 4. Large sliding hiatal hernia. 5. Aortic Atherosclerosis (ICD10-I70.0). Extensive coronary artery atherosclerosis. Electronically Signed   By: Logan Bores M.D.   On: 07/05/2018 16:35   Ct Abdomen Pelvis W Contrast  Result Date: 07/05/2018 CLINICAL DATA:  Acute generalized abdominal pain. EXAM: CT ABDOMEN AND PELVIS WITH CONTRAST TECHNIQUE: Multidetector CT imaging of the abdomen and pelvis was performed using the standard protocol following bolus administration of intravenous contrast. CONTRAST:  135mL ISOVUE-300 IOPAMIDOL (ISOVUE-300) INJECTION 61% COMPARISON:  None. FINDINGS: Lower chest: Large hiatal hernia is noted. Visualized lung bases are unremarkable. Hepatobiliary: Cholelithiasis is noted. No biliary dilatation is noted. No abnormality seen in the liver. Pancreas: Unremarkable. No pancreatic ductal dilatation or surrounding inflammatory changes. Spleen: Normal in size without focal abnormality. Adrenals/Urinary Tract: Adrenal glands are unremarkable. Kidneys are normal, without renal calculi, focal lesion, or hydronephrosis. Bladder is unremarkable. Stomach/Bowel: Stomach appears normal. There is no evidence of  bowel obstruction or inflammation. Sigmoid diverticulosis is noted without inflammation. Appendix is not visualized. Vascular/Lymphatic: Aortic atherosclerosis. No enlarged abdominal or pelvic lymph nodes. Reproductive: Status post hysterectomy. 7.4 cm fat containing mass is noted in left adnexal region consistent with dermoid tumor. Other: No abdominal wall hernia or abnormality. No abdominopelvic ascites. Musculoskeletal: No acute or significant osseous findings. IMPRESSION: Large sliding-type hiatal hernia. 7.4 cm fat containing mass seen in left adnexal region consistent with left ovarian dermoid tumor. Sigmoid diverticulosis without inflammation. Aortic Atherosclerosis (ICD10-I70.0). Electronically Signed   By: Marijo Conception, M.D.   On: 07/05/2018 13:43   Dg Chest Portable 1 View  Result Date: 07/05/2018 CLINICAL DATA:  Weakness and hypoxia per ordering notes. Patient unable to give adequate history. Per ED notes- Pt to ED via [facility] from [facility] for N/V/D. Upon EMS arrival pt c/o dizziness and appears pale. Pt has had N/V/D x 2-3 days. EMS states that initial O2 sats were in the 70's and pt was tachycardic. Hx of HTN and diabetes. Non-smoker. EXAM: PORTABLE CHEST - 1 VIEW COMPARISON:  09/24/2017 FINDINGS: Persistent pulmonary vascular congestion. Diffuse interstitial prominence with some improvement in interstitial edema. Stable cardiomegaly. No definite effusion. Visualized bones unremarkable. IMPRESSION: 1. Cardiomegaly and central pulmonary vascular congestion with some improvement in interstitial edema. Electronically Signed   By: Lucrezia Europe M.D.   On: 07/05/2018 11:18    EKG:   Orders placed or performed during the hospital encounter of 07/05/18  . ED EKG  . ED EKG    ASSESSMENT AND PLAN:   84 year old female from independent living facility, past medical history significant for diastolic CHF, anemia of chronic disease, hypertension hyperlipidemia and diabetes mellitus presents to  hospital secondary to worsening nausea vomiting, dizziness and dyspnea  1.  Acute hypoxic respiratory failure-secondary to acute on chronic diastolic CHF exacerbation. -  On 2 L oxygen support which is acute -Cardiology consulted.  Continue IV Lasix twice daily -Strict input and output monitoring, fluid restriction and daily weights. -Echocardiogram has been ordered  2.  Acute gastroenteritis-CT of the abdomen large hiatal hernia. -No obstruction noted.  Will treat with antiemetics symptomatically for now and monitor closely.  3.  Left ovarian dermoid tumor-OB/GYN consult requested on admission.  Patient is status post hysterectomy  4.  Acute on chronic anemia- iron deficiency- IV iron for now - hb stable around 8- no indication for transfusion now - monitor  5. DM- amaryl, lantus, SSI  6. HTN- losartan, metoprolol  7. DVT Prophylaxis- lovenox  PT consulted- likely will need rehab at discharge Daughter updated at bedside    All the records are reviewed and case discussed with Care Management/Social Workerr. Management plans discussed with the patient, family and they are in agreement.  CODE STATUS: Full Code  TOTAL TIME TAKING CARE OF THIS PATIENT: 41 minutes.   POSSIBLE D/C IN 2-3 DAYS, DEPENDING ON CLINICAL CONDITION.   Gladstone Lighter M.D on 07/06/2018 at 12:40 PM  Between 7am to 6pm - Pager - [phone]  After 6pm go to [email or web address] - password [email or web address] [facility]  Office  [phone]  CC: Primary care physician; [name], MD

## 2018-07-06 NOTE — Progress Notes (Signed)
Per protocol assessment, patient scored a Level 1, Duoneb medication frequency changed from Q6 to BID.

## 2018-07-06 NOTE — Consult Note (Signed)
Cardiology Consultation:   Patient ID: URVI IMES MRN: [id number]; DOB: 1932-10-31  Admit date: 07/05/2018 Date of Consult: 07/06/2018  Primary Care Provider: [name], MD Primary Cardiologist:Dr. Rockey Situ    Patient Profile:   Diane Mullins is a 84 y.o. female with a hx of chronic diastolic heart failure who is being seen today for the evaluation of heart failure at the request of Dr. Tressia Miners.  History of Present Illness:   Diane Mullins is an 84 year old female with known history of chronic diastolic heart failure, type 2 diabetes, hypertension, hyperlipidemia, previous stroke and vertigo.  The patient was seen in the past by Dr. Rockey Situ most recently and March of this year.  She had a Lexiscan Myoview done which showed normal ejection fraction with no evidence of ischemia.  It was done for evaluation of dyspnea and diastolic heart failure. The patient was brought from nursing facility due to nausea, vomiting and diarrhea in addition to his dizziness.  She was noted to be hypoxic on presentation with mild tachycardia.  She was found to have worsening anemia with hemoglobin in the 8 range.  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  BNP was mildly elevated in the 200 range.  Chest x-ray showed pulmonary vascular congestion.  The patient was admitted for diuresis and reports some improvement in symptoms.  She denies any chest pain.  Past Medical History:  Diagnosis Date  . Benign paroxysmal positional vertigo 07/17/2014  . Diabetes mellitus   . H/O: rheumatic fever   . Hyperlipidemia   . Hypertension   . Stroke [facility])    TIA Jan. 1st  . Thyroid disease     Past Surgical History:  Procedure Laterality Date  . ABDOMINAL HYSTERECTOMY  1973   menorrhagia  . HERNIA REPAIR  1994  . ORIF ANKLE FRACTURE Right 10/29/2015   Procedure: OPEN REDUCTION INTERNAL FIXATION (ORIF) ANKLE FRACTURE;  Surgeon: Thornton Park, MD;  Location: [facility] ORS;   Service: Orthopedics;  Laterality: Right;  Marland Kitchen VAGINAL DELIVERY     x4     Home Medications:  Prior to Admission medications   Medication Sig Start Date End Date Taking? Authorizing Provider  aspirin EC 81 MG tablet Take 81 mg by mouth daily.   Yes [provider]  furosemide (LASIX) 20 MG tablet Take 1/2 tablet by mouth once daily Patient taking differently: Take 10 mg by mouth daily.  07/03/18  Yes [name], MD  glimepiride (AMARYL) 4 MG tablet Take 1 tablet by mouth every morning and 1/2 tablet every evening 04/09/18  Yes [name], MD  glucose blood (ONE TOUCH ULTRA TEST) test strip Use as instructed to check Blood sugar three times a day.  E11.65 Dispense patient choice. 03/29/16  Yes Jackolyn Confer, MD  Insulin Glargine (LANTUS SOLOSTAR) 100 UNIT/ML Solostar Pen INJECT  17 UNITS SUBCUTANEOUSLY IN THE MORNING 04/09/18  Yes [name], MD  Insulin Pen Needle (PEN NEEDLES) 32G X 4 MM MISC Inject 1 Dose into the skin daily. 12/19/16  Yes [name], MD  levothyroxine (SYNTHROID, LEVOTHROID) 75 MCG tablet Take 1 tablet (75 mcg total) by mouth daily. 04/09/18  Yes [name], MD  losartan (COZAAR) 50 MG tablet Take 1 tablet (50 mg total) by mouth daily. 04/09/18  Yes [name], MD  metFORMIN (GLUCOPHAGE) 1000 MG tablet Take 1 tablet (1,000 mg total) by mouth 2 (two) times daily with a meal.  TAKE 1 TABLET TWICE DAILY  WITH  A  MEAL 04/09/18  Yes [name], MD  metoprolol succinate (TOPROL-XL) 100 MG 24 hr tablet Take 1 tablet (100 mg total) by mouth daily. 04/09/18  Yes [name], MD  omeprazole (PRILOSEC) 20 MG capsule Take 1 capsule (20 mg total) by mouth daily. 04/09/18  Yes [name], MD  simvastatin (ZOCOR) 40 MG tablet Take 1 tablet (40 mg total) by mouth every evening. 04/09/18  Yes [name], MD  ondansetron (ZOFRAN ODT) 4 MG disintegrating tablet Take 1 tablet (4 mg total) by mouth every 8 (eight)  hours as needed for nausea or vomiting. 07/05/18   Carrie Mew, MD  potassium chloride SA (K-DUR,KLOR-CON) 10 MEQ tablet Take 1 tablet (10 mEq total) by mouth daily. Patient not taking: Reported on 07/05/2018 09/25/17   [name], MD    Inpatient Medications: Scheduled Meds: . aspirin EC  81 mg Oral Daily  . budesonide (PULMICORT) nebulizer solution  0.5 mg Nebulization BID  . enoxaparin (LOVENOX) injection  40 mg Subcutaneous Q12H  . furosemide  20 mg Intravenous BID  . glimepiride  2 mg Oral Q supper  . glimepiride  4 mg Oral Q breakfast  . insulin aspart  0-15 Units Subcutaneous TID WC  . insulin aspart  0-5 Units Subcutaneous QHS  . insulin glargine  17 Units Subcutaneous Q2200  . ipratropium-albuterol  3 mL Nebulization BID  . lactobacillus  1 g Oral TID WC  . levothyroxine  75 mcg Oral QAC breakfast  . losartan  50 mg Oral Daily  . metoprolol succinate  100 mg Oral Daily  . multivitamin with minerals  1 tablet Oral Daily  . ondansetron (ZOFRAN) IV  4 mg Intravenous Q6H  . pantoprazole  40 mg Oral Daily  . potassium chloride  10 mEq Oral Daily  . simvastatin  40 mg Oral QPM   Continuous Infusions: . iron sucrose     PRN Meds: acetaminophen **OR** acetaminophen, nitroGLYCERIN, prochlorperazine  Allergies:    Allergies  Allergen Reactions  . Oxycodone     Other reaction(s): Confusion Patient's daughter reported confusion/sensitivity to oxycodone.     Social History:   Social History   Socioeconomic History  . Marital status: Widowed    Spouse name: Not on file  . Number of children: 4  . Years of education: Not on file  . Highest education level: Not on file  Occupational History  . Not on file  Social Needs  . Financial resource strain: Not hard at all  . Food insecurity:    Worry: Never true    Inability: Never true  . Transportation needs:    Medical: No    Non-medical: No  Tobacco Use  . Smoking status: Never Smoker  . Smokeless  tobacco: Never Used  Substance and Sexual Activity  . Alcohol use: No  . Drug use: No  . Sexual activity: Never  Lifestyle  . Physical activity:    Days per week: 3 days    Minutes per session: 50 min  . Stress: Not at all  Relationships  . Social connections:    Talks on phone: Not on file    Gets together: Not on file    Attends religious service: Not on file    Active member of club or organization: Not on file    Attends meetings of clubs or organizations: Not on file    Relationship status: Not on file  .  Intimate partner violence:    Fear of current or ex partner: Not on file    Emotionally abused: Not on file    Physically abused: Not on file    Forced sexual activity: Not on file  Other Topics Concern  . Not on file  Social History Narrative   Lives in [place] alone. No pets. Retired from [employer].   Diet: healthy   Exercise: water aerobics twice weekly, balance class   Ambulates with a walker at baseline    Family History:    Family History  Problem Relation Age of Onset  . Heart disease Mother 33  . Heart attack Mother 44  . Cancer Father        colon  . Diabetes Sister   . Hypertension Sister   . Cancer Brother        kidney  . Diabetes Brother   . Cancer Daughter        ovarian  . Non-Hodgkin's lymphoma Son      ROS:  Please see the history of present illness.   All other ROS reviewed and negative.     Physical Exam/Data:   Vitals:   07/05/18 2159 07/05/18 2301 07/06/18 0817 07/06/18 0921  BP:   (!) 128/51 (!) 115/47  Pulse:   77 89  Resp:      Temp:   98.6 F (37 C)   TempSrc:   Oral   SpO2:  95% 99%   Weight: 100 kg       Intake/Output Summary (Last 24 hours) at 07/06/2018 1342 Last data filed at 07/06/2018 1338 Gross per 24 hour  Intake 1000 ml  Output 1200 ml  Net -200 ml   Filed Weights   07/05/18 1025 07/05/18 2159  Weight: 98.4 kg 100 kg   Body mass index is 41.64 kg/m.  General:  Well nourished, well developed, in no  acute distress HEENT: normal Lymph: no adenopathy Neck: no JVD Endocrine:  No thryomegaly Vascular: No carotid bruits; FA pulses 2+ bilaterally without bruits  Cardiac:  normal S1, S2; RRR; no murmur  Lungs:  clear to auscultation bilaterally, no wheezing, rhonchi or rales  Abd: soft, nontender, no hepatomegaly  Ext: no edema Musculoskeletal:  No deformities, BUE and BLE strength normal and equal Skin: warm and dry  Neuro:  CNs 2-12 intact, no focal abnormalities noted Psych:  Normal affect   EKG: EKG is not available for review.   Relevant CV Studies: Echocardiogram was personally reviewed by me: It was done today and showed normal LV systolic function with an EF of 60 to 03%, grade 2 diastolic dysfunction, moderate tricuspid regurgitation and moderate pulmonary hypertension.  Laboratory Data:  Chemistry Recent Labs  Lab 07/05/18 1034 07/05/18 2345 07/06/18 0720  NA 139  --  141  K 3.8  --  3.9  CL 103  --  103  CO2 26  --  30  GLUCOSE 225*  --  107*  BUN 10  --  10  CREATININE 0.97 0.92 0.93  CALCIUM 8.5*  --  8.3*  GFRNONAA 53* 56* 55*  GFRAA >60 >60 >60  ANIONGAP 10  --  8    Recent Labs  Lab 07/05/18 1034  PROT 6.3*  ALBUMIN 3.3*  AST 20  ALT 11  ALKPHOS 91  BILITOT 0.4   Hematology Recent Labs  Lab 07/05/18 1034 07/05/18 2345 07/06/18 0720  WBC 8.9 7.1 7.3  RBC 4.00 3.73*  3.73* 3.68*  HGB 8.9* 8.3* 8.1*  HCT 30.6* 29.0* 28.6*  MCV 76.5* 77.7* 77.7*  MCH 22.3* 22.3* 22.0*  MCHC 29.1* 28.6* 28.3*  RDW 17.2* 17.2* 17.3*  PLT 325 285 289   Cardiac Enzymes Recent Labs  Lab 07/05/18 1918 07/05/18 2345 07/06/18 0720  TROPONINI 0.03* 0.03* 0.03*   No results for input(s): TROPIPOC in the last 168 hours.  BNP Recent Labs  Lab 07/05/18 2345  BNP 210.0*    DDimer No results for input(s): DDIMER in the last 168 hours.  Radiology/Studies:  Ct Chest Wo Contrast  Result Date: 07/05/2018 CLINICAL DATA:  Acute respiratory failure. EXAM: CT  CHEST WITHOUT CONTRAST TECHNIQUE: Multidetector CT imaging of the chest was performed following the standard protocol without IV contrast. COMPARISON:  Chest radiograph 07/05/2018 and CTA 09/24/2017 FINDINGS: Cardiovascular: Thoracic aortic atherosclerosis without aneurysm. Enlarged main pulmonary artery measuring 3.9 cm in diameter. Extensive coronary artery atherosclerosis. Cardiomegaly. No pericardial effusion. Mediastinum/Nodes: Similar appearance of small mediastinal lymph nodes compared to the prior CT. No gross hilar lymphadenopathy within limitations of noncontrast technique. Large sliding hiatal hernia, slightly larger than on the prior CT. Lungs/Pleura: No pleural effusion or pneumothorax. Unchanged 3 mm left lower lobe nodule (series 3, image 71). Faint ground-glass opacity and minimal interlobular septal thickening bilaterally. Mild dependent atelectasis in the lower lobes. Upper Abdomen: More fully evaluated on earlier abdomen and pelvis CT. Musculoskeletal: No acute osseous abnormality or suspicious osseous lesion. IMPRESSION: 1. Suspect mild pulmonary edema. 2. Mild dependent atelectasis in the lower lobes. 3. Pulmonary arterial enlargement which can be seen with pulmonary hypertension. 4. Large sliding hiatal hernia. 5. Aortic Atherosclerosis (ICD10-I70.0). Extensive coronary artery atherosclerosis. Electronically Signed   By: Logan Bores M.D.   On: 07/05/2018 16:35   Ct Abdomen Pelvis W Contrast  Result Date: 07/05/2018 CLINICAL DATA:  Acute generalized abdominal pain. EXAM: CT ABDOMEN AND PELVIS WITH CONTRAST TECHNIQUE: Multidetector CT imaging of the abdomen and pelvis was performed using the standard protocol following bolus administration of intravenous contrast. CONTRAST:  167mL ISOVUE-300 IOPAMIDOL (ISOVUE-300) INJECTION 61% COMPARISON:  None. FINDINGS: Lower chest: Large hiatal hernia is noted. Visualized lung bases are unremarkable. Hepatobiliary: Cholelithiasis is noted. No biliary  dilatation is noted. No abnormality seen in the liver. Pancreas: Unremarkable. No pancreatic ductal dilatation or surrounding inflammatory changes. Spleen: Normal in size without focal abnormality. Adrenals/Urinary Tract: Adrenal glands are unremarkable. Kidneys are normal, without renal calculi, focal lesion, or hydronephrosis. Bladder is unremarkable. Stomach/Bowel: Stomach appears normal. There is no evidence of bowel obstruction or inflammation. Sigmoid diverticulosis is noted without inflammation. Appendix is not visualized. Vascular/Lymphatic: Aortic atherosclerosis. No enlarged abdominal or pelvic lymph nodes. Reproductive: Status post hysterectomy. 7.4 cm fat containing mass is noted in left adnexal region consistent with dermoid tumor. Other: No abdominal wall hernia or abnormality. No abdominopelvic ascites. Musculoskeletal: No acute or significant osseous findings. IMPRESSION: Large sliding-type hiatal hernia. 7.4 cm fat containing mass seen in left adnexal region consistent with left ovarian dermoid tumor. Sigmoid diverticulosis without inflammation. Aortic Atherosclerosis (ICD10-I70.0). Electronically Signed   By: Marijo Conception, M.D.   On: 07/05/2018 13:43   Dg Chest Portable 1 View  Result Date: 07/05/2018 CLINICAL DATA:  Weakness and hypoxia per ordering notes. Patient unable to give adequate history. Per ED notes- Pt to ED via [facility] from [facility] for N[facility]V/D. Upon EMS arrival pt c/o dizziness and appears pale. Pt has had N/V/D x 2-3 days. EMS states that initial O2 sats were in the 70's and pt was tachycardic. Hx of HTN  and diabetes. Non-smoker. EXAM: PORTABLE CHEST - 1 VIEW COMPARISON:  09/24/2017 FINDINGS: Persistent pulmonary vascular congestion. Diffuse interstitial prominence with some improvement in interstitial edema. Stable cardiomegaly. No definite effusion. Visualized bones unremarkable. IMPRESSION: 1. Cardiomegaly and central pulmonary vascular congestion with some improvement in  interstitial edema. Electronically Signed   By: Lucrezia Europe M.D.   On: 07/05/2018 11:18    Assessment and Plan:   1. Acute on chronic diastolic heart failure: Likely exacerbated by worsening anemia, low albumin and what seems to be gastroenteritis.  The patient continues to be volume overloaded by exam and also based on her echocardiogram results.  I recommend continuing IV diuresis as is being done.  The patient had Lexiscan Myoview done in March of this year which showed no evidence of ischemia with normal ejection fraction.  She has no symptoms suggestive of angina at the present time. 2. Acute gastroenteritis: Supportive care. 3. Acute on chronic anemia: Treated with IV iron. 4. Essential hypertension: Blood pressures controlled on current medications      For questions or updates, please contact [place] Please consult [email or web address] for contact info under     Signed, Kathlyn Sacramento, MD  07/06/2018 1:42 PM

## 2018-07-06 NOTE — Evaluation (Signed)
Physical Therapy Evaluation Patient Details Name: Diane Mullins MRN: [id number] DOB: [birth year] Today's Date: 07/06/2018   History of Present Illness  pt is a 84 y.o F admitted on 07/05/2018 with dx of respiratory failure. nursing facility with two 3-day history of nausea/vomiting/diarrhea, dizziness, in route to the emergency room patient was found to be satting 70% on room air by EMS.   Clinical Impression  Patient was laying in bed with CNA in room checking blood sugar. She is A&O x 4 and denies any pain. She is MODI with bed mobility and sit to standing using RW. She demonstrates general weakness in bil LE at a 4-/5. She was able to ambulate to recliner ~5 ft with RW, requiring verbal cues for hand placement and proper use. She remained on O2 2 LPM throughout session satting at 96%. Pt fatigues quickly with weight bearing activities but is willing to participate with cues for encouragement. Nursing was in the room at the end of session. She would benefit from physical therapy to increase strength/ endurance and safety with DME. She would benefit from continued physical therapy follwing discharge via SNF.     Follow Up Recommendations SNF    Equipment Recommendations  None recommended by PT(pt has RW and rollator)    Recommendations for Other Services       Precautions / Restrictions Precautions Precautions: None Restrictions Weight Bearing Restrictions: No      Mobility  Bed Mobility                  Transfers Overall transfer level: Modified independent               General transfer comment: using bed side railing with bil UE to assist with rolling from supine to sit  Ambulation/Gait Ambulation/Gait assistance: Modified independent (Device/Increase time)(CGA) Gait Distance (Feet): 5 Feet Assistive device: Rolling walker (2 wheeled) Gait Pattern/deviations: Step-to pattern;Decreased stride length;Trendelenburg;Antalgic        Stairs             Wheelchair Mobility    Modified Rankin (Stroke Patients Only)       Balance                                             Pertinent Vitals/Pain Pain Assessment: No/denies pain    Home Living Family/patient expects to be discharged to:: Assisted living               Home Equipment: Walker - 2 wheels(rollator)      Prior Function Level of Independence: Independent with assistive device(s)(using rollator)               Hand Dominance   Dominant Hand: Right    Extremity/Trunk Assessment   Upper Extremity Assessment Upper Extremity Assessment: Overall WFL for tasks assessed    Lower Extremity Assessment Lower Extremity Assessment: RLE deficits/detail;LLE deficits/detail RLE Deficits / Details: general 4/5  LLE Deficits / Details: general 4/5       Communication   Communication: HOH  Cognition Arousal/Alertness: Awake/alert   Overall Cognitive Status: Within Functional Limits for tasks assessed                                        General Comments  Exercises Other Exercises Other Exercises: seated EOB heel raises 2 x 10   Assessment/Plan    PT Assessment Patient needs continued PT services  PT Problem List Decreased strength;Decreased activity tolerance;Decreased mobility;Decreased knowledge of use of DME;Decreased safety awareness;Obesity;Decreased balance       PT Treatment Interventions Gait training;DME instruction;Therapeutic activities;Therapeutic exercise;Balance training    PT Goals (Current goals can be found in the Care Plan section)  Acute Rehab PT Goals Patient Stated Goal: to go home PT Goal Formulation: With patient Time For Goal Achievement: 07/20/18 Potential to Achieve Goals: Good    Frequency Min 2X/week   Barriers to discharge        Co-evaluation               AM-PAC PT "6 Clicks" Daily Activity  Outcome Measure Difficulty turning over in bed (including adjusting  bedclothes, sheets and blankets)?: A Lot Difficulty moving from lying on back to sitting on the side of the bed? : A Lot Difficulty sitting down on and standing up from a chair with arms (e.g., wheelchair, bedside commode, etc,.)?: A Lot Help needed moving to and from a bed to chair (including a wheelchair)?: A Lot Help needed walking in hospital room?: A Lot Help needed climbing 3-5 steps with a railing? : Total 6 Click Score: 11    End of Session Equipment Utilized During Treatment: Gait belt;Oxygen(2 LPM) Activity Tolerance: Patient limited by fatigue;Patient tolerated treatment well;Patient limited by lethargy Patient left: in chair;with call bell/phone within reach;with nursing/sitter in room Nurse Communication: Mobility status(nurse present at end of evaluation) PT Visit Diagnosis: Unsteadiness on feet (R26.81);Muscle weakness (generalized) (M62.81);Difficulty in walking, not elsewhere classified (R26.2)    Time: 4944-9675 PT Time Calculation (min) (ACUTE ONLY): 24 min   Charges:   PT Evaluation $PT Eval Moderate Complexity: 1 Mod PT Treatments $Therapeutic Exercise: 8-22 mins        Richey Doolittle PT, DPT, LAT, ATC  07/06/18  12:14 PM      Ashari Llewellyn 07/06/2018, 12:10 PM

## 2018-07-06 NOTE — Progress Notes (Signed)
*  PRELIMINARY RESULTS* Echocardiogram 2D Echocardiogram has been performed.  Diane Mullins 07/06/2018, 11:10 AM

## 2018-07-06 NOTE — NC FL2 (Signed)
[place] LEVEL OF CARE SCREENING TOOL     IDENTIFICATION  Patient Name: Diane Mullins Birthdate: 02/28/1933 Sex: female Admission Date (Current Location): 07/05/2018  [place] and [place] Number:  Engineering geologist and Address:  [facility], [address], [place], [place] [address]      Provider Number: [id number]  Attending Physician Name and Address:  Gladstone Lighter, MD  Relative Name and Phone Number:  [name] (Daughter) [phone]    Current Level of Care: Hospital Recommended Level of Care: [place] Prior Approval Number:    Date Approved/Denied:   PASRR Number: [id number]  Discharge Plan: SNF    Current Diagnoses: Patient Active Problem List   Diagnosis Date Noted  . Acute on chronic diastolic heart failure ([place])   . Respiratory failure ([place]) 07/05/2018  . Paroxysmal tachycardia ([place]) 11/16/2017  . Coronary artery calcification seen on CAT scan 11/16/2017  . Morbid obesity ([place]) 10/30/2017  . (HFpEF) heart failure with preserved ejection fraction ([place]) 09/28/2017  . Family history of colon cancer 04/17/2017  . Closed right ankle fracture 10/28/2015  . Fatigue 10/18/2015  . TIA (transient ischemic attack) 09/18/2015  . Orthostatic hypotension 08/02/2015  . Right shoulder injury 08/02/2015  . Adjustment disorder with anxious mood 03/30/2015  . Postmenopausal estrogen deficiency 06/03/2014  . DNR (do not resuscitate) discussion 03/04/2014  . Gait disturbance 05/07/2013  . Diabetes ([place]) 04/21/2013  . Shortness of breath 12/09/2012  . Hearing loss 12/09/2012  . Anxiety 01/31/2012  . Hypertension 12/22/2011  . Hyperlipidemia 12/22/2011  . Hypothyroidism 12/22/2011    Orientation RESPIRATION BLADDER Height & Weight     Self, Time, Situation, Place  O2(2L o2 acute/see dc summary ) Continent Weight: 220 lb 6.4 oz (100 kg) Height:     BEHAVIORAL SYMPTOMS/MOOD NEUROLOGICAL BOWEL NUTRITION  STATUS      Continent Diet(Heart healthy/Carb modified)  AMBULATORY STATUS COMMUNICATION OF NEEDS Skin   Extensive Assist Verbally Normal                       Personal Care Assistance Level of Assistance  Bathing, Feeding, Dressing Bathing Assistance: Limited assistance Feeding assistance: Independent Dressing Assistance: Limited assistance     Functional Limitations Info  Sight, Hearing, Speech Sight Info: Adequate Hearing Info: Impaired Speech Info: Adequate    SPECIAL CARE FACTORS FREQUENCY  PT (By licensed PT)     PT Frequency: Up to 5X per week              Contractures Contractures Info: Not present    Additional Factors Info  Code Status, Allergies Code Status Info: Full Allergies Info: Oxycodone           Current Medications (07/06/2018):  This is the current hospital active medication list Current Facility-Administered Medications  Medication Dose Route Frequency Provider Last Rate Last Dose  . acetaminophen (TYLENOL) tablet 650 mg  650 mg Oral Q6H PRN Salary, Montell D, MD       Or  . acetaminophen (TYLENOL) suppository 650 mg  650 mg Rectal Q6H PRN Salary, Montell D, MD      . aspirin EC tablet 81 mg  81 mg Oral Daily Salary, Montell D, MD   81 mg at 07/06/18 0922  . budesonide (PULMICORT) nebulizer solution 0.5 mg  0.5 mg Nebulization BID Salary, Montell D, MD   0.5 mg at 07/06/18 0819  . enoxaparin (LOVENOX) injection 40 mg  40 mg Subcutaneous Q12H Salary,  Montell D, MD   40 mg at 07/06/18 0919  . furosemide (LASIX) injection 20 mg  20 mg Intravenous BID Loney Hering D, MD   20 mg at 07/06/18 0825  . glimepiride (AMARYL) tablet 2 mg  2 mg Oral Q supper Salary, Montell D, MD      . glimepiride (AMARYL) tablet 4 mg  4 mg Oral Q breakfast Salary, Montell D, MD   4 mg at 07/06/18 0921  . insulin aspart (novoLOG) injection 0-15 Units  0-15 Units Subcutaneous TID WC Salary, Montell D, MD   5 Units at 07/06/18 1151  . insulin aspart (novoLOG)  injection 0-5 Units  0-5 Units Subcutaneous QHS Salary, Montell D, MD      . insulin glargine (LANTUS) injection 17 Units  17 Units Subcutaneous Q2200 Gorden Harms, MD   17 Units at 07/05/18 2351  . ipratropium-albuterol (DUONEB) 0.5-2.5 (3) MG/3ML nebulizer solution 3 mL  3 mL Nebulization BID Salary, Montell D, MD   3 mL at 07/06/18 0819  . iron sucrose (VENOFER) 200 mg in sodium chloride 0.9 % 150 mL IVPB  200 mg Intravenous Q24H Gladstone Lighter, MD 106.7 mL/hr at 07/06/18 1406 200 mg at 07/06/18 1406  . lactobacillus (FLORANEX/LACTINEX) granules 1 g  1 g Oral TID WC Salary, Montell D, MD   1 g at 07/06/18 1151  . levothyroxine (SYNTHROID, LEVOTHROID) tablet 75 mcg  75 mcg Oral QAC breakfast Salary, Holly Bodily D, MD   75 mcg at 07/06/18 0824  . losartan (COZAAR) tablet 50 mg  50 mg Oral Daily Salary, Montell D, MD      . metoprolol succinate (TOPROL-XL) 24 hr tablet 100 mg  100 mg Oral Daily Salary, Montell D, MD      . multivitamin with minerals tablet 1 tablet  1 tablet Oral Daily Salary, Avel Peace, MD   1 tablet at 07/06/18 0921  . nitroGLYCERIN (NITROSTAT) SL tablet 0.4 mg  0.4 mg Sublingual Q5 min PRN Salary, Montell D, MD      . ondansetron (ZOFRAN) injection 4 mg  4 mg Intravenous Q6H Kalisetti, Radhika, MD      . pantoprazole (PROTONIX) EC tablet 40 mg  40 mg Oral Daily Salary, Montell D, MD   40 mg at 07/06/18 0921  . potassium chloride (K-DUR,KLOR-CON) CR tablet 10 mEq  10 mEq Oral Daily Salary, Montell D, MD   10 mEq at 07/06/18 0921  . prochlorperazine (COMPAZINE) injection 10 mg  10 mg Intravenous Q6H PRN Gladstone Lighter, MD      . simvastatin (ZOCOR) tablet 40 mg  40 mg Oral QPM Salary, Montell D, MD   40 mg at 07/05/18 2351     Discharge Medications: Please see discharge summary for a list of discharge medications.  Relevant Imaging Results:  Relevant Lab Results:   Additional Information SS# [id number]  Zettie Pho, LCSW

## 2018-07-06 NOTE — Plan of Care (Signed)
  Problem: Health Behavior/Discharge Planning: Goal: Ability to manage health-related needs will improve Outcome: Progressing   

## 2018-07-06 NOTE — Clinical Social Work Note (Signed)
Clinical Social Work Assessment  Patient Details  Name: Diane Mullins MRN: [id number] Date of Birth: March 11, 1933  Date of referral:  07/06/18               Reason for consult:  Facility Placement                Permission sought to share information with:  Chartered certified accountant granted to share information::  Yes, Verbal Permission Granted  Name::        Agency::  [facility] area SNFs that accept [place]  Relationship::     Contact Information:     Housing/Transportation Living arrangements for the past 2 months:  Charity fundraiser of Information:  Patient, Medical Team, Adult Children Patient Interpreter Needed:  None Criminal Activity/Legal Involvement Pertinent to Current Situation/Hospitalization:  No - Comment as needed Significant Relationships:  Adult Children, [employer], Minor, Friend, Siblings Lives with:  Self Do you feel safe going back to the place where you live?  Yes Need for family participation in patient care:  No (Coment)  Care giving concerns:  PT recommendation for SNF; patient admitted from an [place] Worker assessment / plan:  The CSW met with the patient and her daughter at bedside to discuss discharge planning and the PT recommendation for SNF. The patient and her daughter are in agreement with the plan, and the patient named the following SNFs as her preferences: 1. [facility] 2. [facility] 3. [facility]. The patient has asked that no referral be sent to [facility]. The CSW explained that the patient's MCO would need to provide prior authorization, and the patient asked that when beginning auth, the CSW would ask if there is a copay as the patient's daughter has been informed that the patient has met her out of pocket maximum.  The CSW has begun the referral process and will provide bed offers to the patient when available. The discharge plan is most likely  Monday via family transport unless such would be unsafe.  Employment status:  Retired Nurse, adult PT Recommendations:  [place] / Referral to community resources:  [place]  Patient/Family's Response to care: The patient and her daughter thanked the CSW.  Patient/Family's Understanding of and Emotional Response to Diagnosis, Current Treatment, and Prognosis:  The patient and her daughter understand and agree with the discharge plan.  Emotional Assessment Appearance:  Appears stated age Attitude/Demeanor/Rapport:  Gracious, Engaged Affect (typically observed):  Accepting, Appropriate, Pleasant Orientation:  Oriented to Self, Oriented to Place, Oriented to  Time, Oriented to Situation Alcohol / Substance use:  Never Used Psych involvement (Current and /or in the community):  No (Comment)  Discharge Needs  Concerns to be addressed:  Care Coordination, Discharge Planning Concerns Readmission within the last 30 days:  No Current discharge risk:  Chronically ill, Lives alone Barriers to Discharge:  Continued Medical Work up   [employer], LCSW 07/06/2018, 4:00 PM

## 2018-07-06 NOTE — Plan of Care (Signed)
  Problem: Activity: Goal: Risk for activity intolerance will decrease Outcome: Progressing   

## 2018-07-07 LAB — CBC
HCT: 30.6 % — ABNORMAL LOW (ref 36.0–46.0)
HEMOGLOBIN: 8.8 g/dL — AB (ref 12.0–15.0)
MCH: 22.3 pg — ABNORMAL LOW (ref 26.0–34.0)
MCHC: 28.8 g/dL — ABNORMAL LOW (ref 30.0–36.0)
MCV: 77.5 fL — ABNORMAL LOW (ref 80.0–100.0)
NRBC: 0 % (ref 0.0–0.2)
PLATELETS: 303 10*3/uL (ref 150–400)
RBC: 3.95 MIL/uL (ref 3.87–5.11)
RDW: 17.2 % — ABNORMAL HIGH (ref 11.5–15.5)
WBC: 12 10*3/uL — AB (ref 4.0–10.5)

## 2018-07-07 LAB — BASIC METABOLIC PANEL
ANION GAP: 8 (ref 5–15)
BUN: 11 mg/dL (ref 8–23)
CO2: 32 mmol/L (ref 22–32)
Calcium: 8.4 mg/dL — ABNORMAL LOW (ref 8.9–10.3)
Chloride: 100 mmol/L (ref 98–111)
Creatinine, Ser: 0.92 mg/dL (ref 0.44–1.00)
GFR, EST NON AFRICAN AMERICAN: 56 mL/min — AB (ref >60)
Glucose, Bld: 106 mg/dL — ABNORMAL HIGH (ref 70–99)
Potassium: 3.7 mmol/L (ref 3.5–5.1)
SODIUM: 140 mmol/L (ref 135–145)

## 2018-07-07 LAB — GLUCOSE, CAPILLARY
GLUCOSE-CAPILLARY: 88 mg/dL (ref 70–99)
GLUCOSE-CAPILLARY: 182 mg/dL — AB (ref 70–99)
GLUCOSE-CAPILLARY: 132 mg/dL — AB (ref 70–99)
Glucose-Capillary: 141 mg/dL — ABNORMAL HIGH (ref 70–99)

## 2018-07-07 MED ORDER — IPRATROPIUM-ALBUTEROL 0.5-2.5 (3) MG/3ML IN SOLN: 3.0000 mL | Freq: Four times a day (QID) | RESPIRATORY_TRACT | Status: DC | PRN

## 2018-07-07 MED ORDER — FUROSEMIDE 10 MG/ML IJ SOLN
40.0000 mg | Freq: Once | INTRAMUSCULAR | Status: AC
  Administered 2018-07-07: 40 mg via INTRAVENOUS
  Filled 2018-07-07: qty 4

## 2018-07-07 NOTE — Progress Notes (Signed)
[place] at [place] NAME: Diane Mullins    MR#:  [id number]  DATE OF BIRTH:  28-Dec-1932  SUBJECTIVE:  CHIEF COMPLAINT:   Chief Complaint  Patient presents with  . Emesis  . Diarrhea   -No nausea vomiting today.  Still requiring 2 L oxygen and is complaining of dyspnea -Complains of feeling weak and lethargic  REVIEW OF SYSTEMS:  Review of Systems  Constitutional: Negative for chills and fever.  HENT: Negative for congestion, ear discharge, hearing loss and nosebleeds.   Eyes: Negative for blurred vision and double vision.  Respiratory: Positive for shortness of breath. Negative for cough and wheezing.   Cardiovascular: Positive for leg swelling. Negative for chest pain and palpitations.  Gastrointestinal: Positive for nausea. Negative for abdominal pain, constipation, diarrhea and vomiting.  Genitourinary: Negative for dysuria.  Musculoskeletal: Negative for myalgias.  Neurological: Negative for dizziness, focal weakness, seizures, weakness and headaches.  Psychiatric/Behavioral: Negative for depression.    DRUG ALLERGIES:   Allergies  Allergen Reactions  . Oxycodone     Other reaction(s): Confusion Patient's daughter reported confusion/sensitivity to oxycodone.     VITALS:  Blood pressure (!) 132/56, pulse 78, temperature 98 F (36.7 C), temperature source Oral, resp. rate 14, weight 99.6 kg, SpO2 96 %.  PHYSICAL EXAMINATION:  Physical Exam  GENERAL:  84 y.o.-year-old elderly patient lying in the bed with no acute distress.  EYES: Pupils equal, round, reactive to light and accommodation. No scleral icterus. Extraocular muscles intact.  HEENT: Head atraumatic, normocephalic. Oropharynx and nasopharynx clear.  NECK:  Supple, no jugular venous distention. No thyroid enlargement, no tenderness.  LUNGS: Normal breath sounds bilaterally, no wheezing,  rhonchi or crepitation. Fine bibasilar crackles heard No use of accessory  muscles of respiration.  CARDIOVASCULAR: S1, S2 normal. No  rubs, or gallops. 2/6 systolic murmur present ABDOMEN: Soft, nontender, nondistended. Bowel sounds present. No organomegaly or mass.  EXTREMITIES: No pedal edema, cyanosis, or clubbing.  NEUROLOGIC: Cranial nerves II through XII are intact. Muscle strength 5/5 in all extremities. Sensation intact. Gait not checked. Global weakness noted. PSYCHIATRIC: The patient is alert and oriented x 3.  SKIN: No obvious rash, lesion, or ulcer.    LABORATORY PANEL:   CBC Recent Labs  Lab 07/07/18 0520  WBC 12.0*  HGB 8.8*  HCT 30.6*  PLT 303   ------------------------------------------------------------------------------------------------------------------  Chemistries  Recent Labs  Lab 07/05/18 1034  07/07/18 0520  NA 139   < > 140  K 3.8   < > 3.7  CL 103   < > 100  CO2 26   < > 32  GLUCOSE 225*   < > 106*  BUN 10   < > 11  CREATININE 0.97   < > 0.92  CALCIUM 8.5*   < > 8.4*  AST 20  --   --   ALT 11  --   --   ALKPHOS 91  --   --   BILITOT 0.4  --   --    < > = values in this interval not displayed.   ------------------------------------------------------------------------------------------------------------------  Cardiac Enzymes Recent Labs  Lab 07/06/18 0720  TROPONINI 0.03*   ------------------------------------------------------------------------------------------------------------------  RADIOLOGY:  Ct Chest Wo Contrast  Result Date: 07/05/2018 CLINICAL DATA:  Acute respiratory failure. EXAM: CT CHEST WITHOUT CONTRAST TECHNIQUE: Multidetector CT imaging of the chest was performed following the standard protocol without IV contrast. COMPARISON:  Chest radiograph 07/05/2018 and CTA 09/24/2017 FINDINGS: Cardiovascular: Thoracic  aortic atherosclerosis without aneurysm. Enlarged main pulmonary artery measuring 3.9 cm in diameter. Extensive coronary artery atherosclerosis. Cardiomegaly. No pericardial effusion.  Mediastinum/Nodes: Similar appearance of small mediastinal lymph nodes compared to the prior CT. No gross hilar lymphadenopathy within limitations of noncontrast technique. Large sliding hiatal hernia, slightly larger than on the prior CT. Lungs/Pleura: No pleural effusion or pneumothorax. Unchanged 3 mm left lower lobe nodule (series 3, image 71). Faint ground-glass opacity and minimal interlobular septal thickening bilaterally. Mild dependent atelectasis in the lower lobes. Upper Abdomen: More fully evaluated on earlier abdomen and pelvis CT. Musculoskeletal: No acute osseous abnormality or suspicious osseous lesion. IMPRESSION: 1. Suspect mild pulmonary edema. 2. Mild dependent atelectasis in the lower lobes. 3. Pulmonary arterial enlargement which can be seen with pulmonary hypertension. 4. Large sliding hiatal hernia. 5. Aortic Atherosclerosis (ICD10-I70.0). Extensive coronary artery atherosclerosis. Electronically Signed   By: Logan Bores M.D.   On: 07/05/2018 16:35   Ct Abdomen Pelvis W Contrast  Result Date: 07/05/2018 CLINICAL DATA:  Acute generalized abdominal pain. EXAM: CT ABDOMEN AND PELVIS WITH CONTRAST TECHNIQUE: Multidetector CT imaging of the abdomen and pelvis was performed using the standard protocol following bolus administration of intravenous contrast. CONTRAST:  137mL ISOVUE-300 IOPAMIDOL (ISOVUE-300) INJECTION 61% COMPARISON:  None. FINDINGS: Lower chest: Large hiatal hernia is noted. Visualized lung bases are unremarkable. Hepatobiliary: Cholelithiasis is noted. No biliary dilatation is noted. No abnormality seen in the liver. Pancreas: Unremarkable. No pancreatic ductal dilatation or surrounding inflammatory changes. Spleen: Normal in size without focal abnormality. Adrenals/Urinary Tract: Adrenal glands are unremarkable. Kidneys are normal, without renal calculi, focal lesion, or hydronephrosis. Bladder is unremarkable. Stomach/Bowel: Stomach appears normal. There is no evidence of  bowel obstruction or inflammation. Sigmoid diverticulosis is noted without inflammation. Appendix is not visualized. Vascular/Lymphatic: Aortic atherosclerosis. No enlarged abdominal or pelvic lymph nodes. Reproductive: Status post hysterectomy. 7.4 cm fat containing mass is noted in left adnexal region consistent with dermoid tumor. Other: No abdominal wall hernia or abnormality. No abdominopelvic ascites. Musculoskeletal: No acute or significant osseous findings. IMPRESSION: Large sliding-type hiatal hernia. 7.4 cm fat containing mass seen in left adnexal region consistent with left ovarian dermoid tumor. Sigmoid diverticulosis without inflammation. Aortic Atherosclerosis (ICD10-I70.0). Electronically Signed   By: Marijo Conception, M.D.   On: 07/05/2018 13:43    EKG:   Orders placed or performed during the hospital encounter of 07/05/18  . ED EKG  . ED EKG    ASSESSMENT AND PLAN:   84 year old female from independent living facility, past medical history significant for diastolic CHF, anemia of chronic disease, hypertension hyperlipidemia and diabetes mellitus presents to hospital secondary to worsening nausea vomiting, dizziness and dyspnea  1.  Acute hypoxic respiratory failure-secondary to acute on chronic diastolic CHF exacerbation. -On 2 L oxygen support which is acute-wean as tolerated -Cardiology consult appreciated.  Continue IV Lasix twice daily -Strict input and output monitoring, fluid restriction and daily weights. -Echocardiogram with normal EF but grade 2 diastolic dysfunction and pulmonary hypertension. -Myoview done in March 2019 was negative.  Patient denies any chest pain.  2.  Acute gastroenteritis-CT of the abdomen large hiatal hernia. -No obstruction noted.   treat with antiemetics symptomatically for now and monitor closely. - improved  3.  Left ovarian dermoid tumor-OB/GYN consult appreciated.  Patient is status post hysterectomy -Outpatient follow-up.  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  Risk of torsion or rupture explained to family.  Pelvic ultrasound with Doppler flow can be ordered as outpatient. -Family history of ovarian  cancer present though.  4.  Acute on chronic anemia- iron deficiency- IV iron for now - hb stable around 8- no indication for transfusion now - monitor  5. DM- amaryl, lantus, SSI  6. HTN- losartan, metoprolol  7. DVT Prophylaxis- lovenox  PT consulted- likely will need rehab at discharge Daughter updated at bedside Likely discharge in 1 to 2 days    All the records are reviewed and case discussed with Care Management/Social Workerr. Management plans discussed with the patient, family and they are in agreement.  CODE STATUS: Full Code  TOTAL TIME TAKING CARE OF THIS PATIENT: 39 minutes.   POSSIBLE D/C IN 1-2 DAYS, DEPENDING ON CLINICAL CONDITION.   Jahshua Bonito M.D on 07/07/2018 at 11:34 AM  Between 7am to 6pm - Pager - [phone]  After 6pm go to [email or web address] - password [email or web address] [facility]  Office  [phone]  CC: Primary care physician; [name], MD

## 2018-07-07 NOTE — Progress Notes (Signed)
Progress Note  Patient Name: Diane Mullins Date of Encounter: 07/07/2018  Primary Cardiologist: Dr. Rockey Situ  Subjective   Improvement in shortness of breath she continues to complain of significant fatigue.  She has not done any walking.  Inpatient Medications    Scheduled Meds: . aspirin EC  81 mg Oral Daily  . enoxaparin (LOVENOX) injection  40 mg Subcutaneous Q12H  . furosemide  20 mg Intravenous BID  . glimepiride  2 mg Oral Q supper  . glimepiride  4 mg Oral Q breakfast  . insulin aspart  0-15 Units Subcutaneous TID WC  . insulin aspart  0-5 Units Subcutaneous QHS  . insulin glargine  17 Units Subcutaneous Q2200  . lactobacillus  1 g Oral TID WC  . levothyroxine  75 mcg Oral QAC breakfast  . losartan  50 mg Oral Daily  . metoprolol succinate  100 mg Oral Daily  . multivitamin with minerals  1 tablet Oral Daily  . ondansetron (ZOFRAN) IV  4 mg Intravenous Q6H  . pantoprazole  40 mg Oral Daily  . potassium chloride  10 mEq Oral Daily  . ramelteon  8 mg Oral QHS  . simvastatin  40 mg Oral QPM   Continuous Infusions: . iron sucrose 200 mg (07/06/18 1406)   PRN Meds: acetaminophen **OR** acetaminophen, ipratropium-albuterol, nitroGLYCERIN, prochlorperazine   Vital Signs    Vitals:   07/06/18 2352 07/07/18 0551 07/07/18 0800 07/07/18 0804  BP:    115/62  Pulse: 77   78  Resp:      Temp:    98 F (36.7 C)  TempSrc:    Oral  SpO2: 95%  (!) 87% 96%  Weight:  99.6 kg      Intake/Output Summary (Last 24 hours) at 07/07/2018 1032 Last data filed at 07/07/2018 0459 Gross per 24 hour  Intake 111.2 ml  Output 1300 ml  Net -1188.8 ml   Filed Weights   07/05/18 2159 07/06/18 2209 07/07/18 0551  Weight: 100 kg 99.3 kg 99.6 kg    Telemetry    n/a  ECG    Not done today.- Personally Reviewed  Physical Exam   GEN: No acute distress.   Neck:  Jugular venous pressure is not well visualized. Cardiac: RRR, no murmurs, rubs, or gallops.  Respiratory: Clear  to auscultation bilaterally. GI: Soft, nontender, non-distended  MS:  Trace edema; No deformity. Neuro:  Nonfocal  Psych: Normal affect   Labs    Chemistry Recent Labs  Lab 07/05/18 1034 07/05/18 2345 07/06/18 0720 07/07/18 0520  NA 139  --  141 140  K 3.8  --  3.9 3.7  CL 103  --  103 100  CO2 26  --  30 32  GLUCOSE 225*  --  107* 106*  BUN 10  --  10 11  CREATININE 0.97 0.92 0.93 0.92  CALCIUM 8.5*  --  8.3* 8.4*  PROT 6.3*  --   --   --   ALBUMIN 3.3*  --   --   --   AST 20  --   --   --   ALT 11  --   --   --   ALKPHOS 91  --   --   --   BILITOT 0.4  --   --   --   GFRNONAA 53* 56* 55* 56*  GFRAA >60 >60 >60 >60  ANIONGAP 10  --  8 8     Hematology Recent Labs  Lab  07/05/18 2345 07/06/18 0720 07/07/18 0520  WBC 7.1 7.3 12.0*  RBC 3.73*  3.73* 3.68* 3.95  HGB 8.3* 8.1* 8.8*  HCT 29.0* 28.6* 30.6*  MCV 77.7* 77.7* 77.5*  MCH 22.3* 22.0* 22.3*  MCHC 28.6* 28.3* 28.8*  RDW 17.2* 17.3* 17.2*  PLT 285 289 303    Cardiac Enzymes Recent Labs  Lab 07/05/18 1918 07/05/18 2345 07/06/18 0720  TROPONINI 0.03* 0.03* 0.03*   No results for input(s): TROPIPOC in the last 168 hours.   BNP Recent Labs  Lab 07/05/18 2345  BNP 210.0*     DDimer No results for input(s): DDIMER in the last 168 hours.   Radiology    Ct Chest Wo Contrast  Result Date: 07/05/2018 CLINICAL DATA:  Acute respiratory failure. EXAM: CT CHEST WITHOUT CONTRAST TECHNIQUE: Multidetector CT imaging of the chest was performed following the standard protocol without IV contrast. COMPARISON:  Chest radiograph 07/05/2018 and CTA 09/24/2017 FINDINGS: Cardiovascular: Thoracic aortic atherosclerosis without aneurysm. Enlarged main pulmonary artery measuring 3.9 cm in diameter. Extensive coronary artery atherosclerosis. Cardiomegaly. No pericardial effusion. Mediastinum/Nodes: Similar appearance of small mediastinal lymph nodes compared to the prior CT. No gross hilar lymphadenopathy within  limitations of noncontrast technique. Large sliding hiatal hernia, slightly larger than on the prior CT. Lungs/Pleura: No pleural effusion or pneumothorax. Unchanged 3 mm left lower lobe nodule (series 3, image 71). Faint ground-glass opacity and minimal interlobular septal thickening bilaterally. Mild dependent atelectasis in the lower lobes. Upper Abdomen: More fully evaluated on earlier abdomen and pelvis CT. Musculoskeletal: No acute osseous abnormality or suspicious osseous lesion. IMPRESSION: 1. Suspect mild pulmonary edema. 2. Mild dependent atelectasis in the lower lobes. 3. Pulmonary arterial enlargement which can be seen with pulmonary hypertension. 4. Large sliding hiatal hernia. 5. Aortic Atherosclerosis (ICD10-I70.0). Extensive coronary artery atherosclerosis. Electronically Signed   By: Logan Bores M.D.   On: 07/05/2018 16:35   Ct Abdomen Pelvis W Contrast  Result Date: 07/05/2018 CLINICAL DATA:  Acute generalized abdominal pain. EXAM: CT ABDOMEN AND PELVIS WITH CONTRAST TECHNIQUE: Multidetector CT imaging of the abdomen and pelvis was performed using the standard protocol following bolus administration of intravenous contrast. CONTRAST:  180mL ISOVUE-300 IOPAMIDOL (ISOVUE-300) INJECTION 61% COMPARISON:  None. FINDINGS: Lower chest: Large hiatal hernia is noted. Visualized lung bases are unremarkable. Hepatobiliary: Cholelithiasis is noted. No biliary dilatation is noted. No abnormality seen in the liver. Pancreas: Unremarkable. No pancreatic ductal dilatation or surrounding inflammatory changes. Spleen: Normal in size without focal abnormality. Adrenals/Urinary Tract: Adrenal glands are unremarkable. Kidneys are normal, without renal calculi, focal lesion, or hydronephrosis. Bladder is unremarkable. Stomach/Bowel: Stomach appears normal. There is no evidence of bowel obstruction or inflammation. Sigmoid diverticulosis is noted without inflammation. Appendix is not visualized. Vascular/Lymphatic:  Aortic atherosclerosis. No enlarged abdominal or pelvic lymph nodes. Reproductive: Status post hysterectomy. 7.4 cm fat containing mass is noted in left adnexal region consistent with dermoid tumor. Other: No abdominal wall hernia or abnormality. No abdominopelvic ascites. Musculoskeletal: No acute or significant osseous findings. IMPRESSION: Large sliding-type hiatal hernia. 7.4 cm fat containing mass seen in left adnexal region consistent with left ovarian dermoid tumor. Sigmoid diverticulosis without inflammation. Aortic Atherosclerosis (ICD10-I70.0). Electronically Signed   By: Marijo Conception, M.D.   On: 07/05/2018 13:43   Dg Chest Portable 1 View  Result Date: 07/05/2018 CLINICAL DATA:  Weakness and hypoxia per ordering notes. Patient unable to give adequate history. Per ED notes- Pt to ED via [facility] from [facility] for N/V/D. Upon EMS arrival  pt c/o dizziness and appears pale. Pt has had N/V/D x 2-3 days. EMS states that initial O2 sats were in the 70's and pt was tachycardic. Hx of HTN and diabetes. Non-smoker. EXAM: PORTABLE CHEST - 1 VIEW COMPARISON:  09/24/2017 FINDINGS: Persistent pulmonary vascular congestion. Diffuse interstitial prominence with some improvement in interstitial edema. Stable cardiomegaly. No definite effusion. Visualized bones unremarkable. IMPRESSION: 1. Cardiomegaly and central pulmonary vascular congestion with some improvement in interstitial edema. Electronically Signed   By: Lucrezia Europe M.D.   On: 07/05/2018 11:18    Cardiac Studies   Echocardiogram on October 19:  - Left ventricle: The cavity size was normal. Systolic function was   normal. The estimated ejection fraction was in the range of 60%   to 65%. Wall motion was normal; there were no regional wall   motion abnormalities. Features are consistent with a pseudonormal   left ventricular filling pattern, with concomitant abnormal   relaxation and increased filling pressure (grade 2 diastolic    dysfunction). - Mitral valve: Calcified annulus. Mildly thickened, mildly   calcified leaflets . There was mild regurgitation. - Left atrium: The atrium was mildly to moderately dilated. - Tricuspid valve: There was moderate regurgitation. - Pulmonary arteries: Systolic pressure was moderately increased.   PA peak pressure: 52 mm Hg (S).   Patient Profile     84 y.o. female with history of chronic diastolic heart failure, type 2 diabetes, hypertension, hyperlipidemia and previous stroke who presented with nausea, vomiting and diarrhea and was found to be hypoxic and tachycardic on presentation with evidence of mild volume overload.  Assessment & Plan    1. Acute on chronic diastolic heart failure: Likely exacerbated by worsening anemia, low albumin and what seems to be gastroenteritis.    Echocardiogram showed normal LV systolic function but with grade 2 diastolic dysfunction and moderate pulmonary hypertension.  She is currently on furosemide intravenously 20 mg twice daily which can be switched to 40 mg once daily before hospital discharge.  This can likely be done tomorrow. 2. Acute gastroenteritis: Supportive care. 3. Acute on chronic anemia: Treated with IV iron. 4. Essential hypertension: Blood pressures controlled on current medications 5. The patient appears to be significantly deconditioned and I agree with SNF placement for rehab.       For questions or updates, please contact [place] Please consult [email or web address] for contact info under        Signed, Kathlyn Sacramento, MD  07/07/2018, 10:32 AM

## 2018-07-08 LAB — GLUCOSE, CAPILLARY
GLUCOSE-CAPILLARY: 79 mg/dL (ref 70–99)
GLUCOSE-CAPILLARY: 205 mg/dL — AB (ref 70–99)
Glucose-Capillary: 166 mg/dL — ABNORMAL HIGH (ref 70–99)
Glucose-Capillary: 108 mg/dL — ABNORMAL HIGH (ref 70–99)

## 2018-07-08 LAB — OVARIAN MALIGNANCY RISK-ROMA
CANCER ANTIGEN (CA) 125: 54.3 U/mL — AB (ref 0.0–38.1)
HE4: 128 pmol/L — ABNORMAL HIGH (ref 0.0–96.9)
POSTMENOPAUSAL ROMA: 4.7 — AB
Premenopausal ROMA: 4.5 — ABNORMAL HIGH

## 2018-07-08 LAB — PREMENOPAUSAL INTERP: HIGH

## 2018-07-08 LAB — BASIC METABOLIC PANEL
Anion gap: 7 (ref 5–15)
BUN: 14 mg/dL (ref 8–23)
CHLORIDE: 97 mmol/L — AB (ref 98–111)
CO2: 34 mmol/L — ABNORMAL HIGH (ref 22–32)
CREATININE: 1.22 mg/dL — AB (ref 0.44–1.00)
Calcium: 8.3 mg/dL — ABNORMAL LOW (ref 8.9–10.3)
GFR calc non Af Amer: 40 mL/min — ABNORMAL LOW (ref >60)
GFR, EST AFRICAN AMERICAN: 46 mL/min — AB (ref >60)
Glucose, Bld: 79 mg/dL (ref 70–99)
POTASSIUM: 3.6 mmol/L (ref 3.5–5.1)
SODIUM: 138 mmol/L (ref 135–145)

## 2018-07-08 LAB — HEMOGLOBIN A1C
Hgb A1c MFr Bld: 8.1 % — ABNORMAL HIGH (ref 4.8–5.6)
Mean Plasma Glucose: 186 mg/dL

## 2018-07-08 LAB — POSTMENOPAUSAL INTERP: HIGH

## 2018-07-08 MED ORDER — METOPROLOL SUCCINATE ER 25 MG PO TB24
25.0000 mg | ORAL_TABLET | Freq: Every day | ORAL | Status: DC
  Administered 2018-07-08 – 2018-07-09 (×2): 25 mg via ORAL
  Filled 2018-07-08 (×2): qty 1

## 2018-07-08 NOTE — Progress Notes (Addendum)
Physical Therapy Treatment Patient Details Name: Diane Mullins MRN: [id number] DOB: 1933/02/01 Today's Date: 07/08/2018    History of Present Illness pt is a 84 y.o F admitted on 07/05/2018 with dx of respiratory failure. nursing facility with two 3-day history of nausea/vomiting/diarrhea, dizziness, in route to the emergency room patient was found to be satting 70% on room air by EMS.     PT Comments    Pt agreeable to PT; denies pain. Reports not feeling well with general malaise/weakness. Pt vitals assessed O2 sts on 2L O2 98-100% at rest and with limited stand activity; however, on room air, pt desaturates to low 80s at rest. Orthostatic BP as follows: lying 131/49, seated 141/56, stand 126/46 and stand post 3 min 109/56 with positive weakening in LE symptoms requiring urgent sit. Pt educated on seated and long sit exercises for progressing endurance/strength. Pt also educated on incentive spirometer and encouraged use hourly. Pt requires Min guard for STS and 2 step transfer bed to chair; deferred further ambulation due to low BP values and weakness. Pt is not near baseline function at this time, as pt is unable to stand/ambulate or perform standing ADL's independently. Fall risk with independent stand/ambulation. Continue PT to progress endurance, strength with safe O2 and BP levels to improve functional mobility towards baseline.    Follow Up Recommendations  SNF     Equipment Recommendations       Recommendations for Other Services       Precautions / Restrictions Fall    Mobility  Bed Mobility               General bed mobility comments: Mod I  Transfers Overall transfer level: Needs assistance Equipment used: Rolling walker (2 wheeled) Transfers: Sit to/from Stand Sit to Stand: Min guard(for safety)            Ambulation/Gait             General Gait Details: deferred due to BP issues and symptomatic of weak in the knees   Stairs              Wheelchair Mobility    Modified Rankin (Stroke Patients Only)       Balance                                            Cognition Arousal/Alertness: Awake/alert Behavior During Therapy: WFL for tasks assessed/performed Overall Cognitive Status: Within Functional Limits for tasks assessed                                        Exercises General Exercises - Lower Extremity Ankle Circles/Pumps: AROM;Both;20 reps;Seated Quad Sets: Strengthening;Both;20 reps Gluteal Sets: Strengthening;Both;20 reps Long Arc Quad: AROM;Both;10 reps;Seated(2 sets) Heel Slides: AROM;Both;10 reps Hip ABduction/ADduction: AROM;Both;10 reps;Seated(2 sets) Hip Flexion/Marching: AROM;Both;20 reps;Seated Other Exercises Other Exercises: use of incentive spirometer    General Comments        Pertinent Vitals/Pain Pain Assessment: No/denies pain    Home Living                      Prior Function            PT Goals (current goals can now be found in the care plan section) Progress towards PT  goals: Progressing toward goals(slowly)    Frequency    Min 2X/week      PT Plan Current plan remains appropriate    Co-evaluation              AM-PAC PT "6 Clicks" Daily Activity  Outcome Measure  Difficulty turning over in bed (including adjusting bedclothes, sheets and blankets)?: A Little Difficulty moving from lying on back to sitting on the side of the bed? : A Little Difficulty sitting down on and standing up from a chair with arms (e.g., wheelchair, bedside commode, etc,.)?: Unable Help needed moving to and from a bed to chair (including a wheelchair)?: A Little Help needed walking in hospital room?: A Lot Help needed climbing 3-5 steps with a railing? : Total 6 Click Score: 13    End of Session Equipment Utilized During Treatment: Gait belt;Oxygen Activity Tolerance: Patient limited by fatigue;Other (comment)(low BP;  weakness) Patient left: in chair;with call bell/phone within reach;with chair alarm set Nurse Communication: Other (comment);Mobility status(MD; orthostatic BP values. SW treatment/mobility) PT Visit Diagnosis: Unsteadiness on feet (R26.81);Muscle weakness (generalized) (M62.81);Difficulty in walking, not elsewhere classified (R26.2)     Time: 2897-9150 PT Time Calculation (min) (ACUTE ONLY): 46 min  Charges:  $Therapeutic Exercise: 23-37 mins $Therapeutic Activity: 8-22 mins                      Larae Grooms, PTA 07/08/2018, 1:33 PM

## 2018-07-08 NOTE — Progress Notes (Signed)
Progress Note  Patient Name: Diane Mullins Date of Encounter: 07/08/2018  Primary Cardiologist: Rockey Situ  Subjective   SOB improving. No chest pain. Documented UOP of 1.8 L for the past 24 hours with a net - 2.6 L for the admission. Weight 99.6-->99.3 kg. SCr 0.92-->1.22. Already received IV Lasix this morning.   Inpatient Medications    Scheduled Meds: . aspirin EC  81 mg Oral Daily  . enoxaparin (LOVENOX) injection  40 mg Subcutaneous Q12H  . furosemide  20 mg Intravenous BID  . glimepiride  2 mg Oral Q supper  . glimepiride  4 mg Oral Q breakfast  . insulin aspart  0-15 Units Subcutaneous TID WC  . insulin aspart  0-5 Units Subcutaneous QHS  . insulin glargine  17 Units Subcutaneous Q2200  . lactobacillus  1 g Oral TID WC  . levothyroxine  75 mcg Oral QAC breakfast  . metoprolol succinate  25 mg Oral Daily  . multivitamin with minerals  1 tablet Oral Daily  . ondansetron (ZOFRAN) IV  4 mg Intravenous Q6H  . pantoprazole  40 mg Oral Daily  . potassium chloride  10 mEq Oral Daily  . ramelteon  8 mg Oral QHS  . simvastatin  40 mg Oral QPM   Continuous Infusions: . iron sucrose 200 mg (07/07/18 1303)   PRN Meds: acetaminophen **OR** acetaminophen, ipratropium-albuterol, nitroGLYCERIN, prochlorperazine   Vital Signs    Vitals:   07/07/18 1737 07/07/18 2345 07/08/18 0517 07/08/18 0833  BP: (!) 118/46 (!) 129/45  (!) 114/48  Pulse: (!) 59 61  61  Resp:  14    Temp:  97.9 F (36.6 C)  98.3 F (36.8 C)  TempSrc:  Oral  Oral  SpO2:  99%  100%  Weight:   99.3 kg   Height:   5\' 1"  (1.549 m)     Intake/Output Summary (Last 24 hours) at 07/08/2018 1052 Last data filed at 07/08/2018 1036 Gross per 24 hour  Intake 240 ml  Output 2275 ml  Net -2035 ml   Filed Weights   07/06/18 2209 07/07/18 0551 07/08/18 0517  Weight: 99.3 kg 99.6 kg 99.3 kg    Telemetry    n/a - Personally Reviewed  ECG    n/a - Personally Reviewed  Physical Exam   GEN: No acute  distress.   Neck: No JVD. Cardiac: RRR, no murmurs, rubs, or gallops.  Respiratory: Faint wheezing bilaterally.  GI: Soft, nontender, non-distended.   MS: Trace bilateral pretibial edema; No deformity. Neuro:  Alert and oriented x 3; Nonfocal.  Psych: Normal affect.  Labs    Chemistry Recent Labs  Lab 07/05/18 1034  07/06/18 0720 07/07/18 0520 07/08/18 0433  NA 139  --  141 140 138  K 3.8  --  3.9 3.7 3.6  CL 103  --  103 100 97*  CO2 26  --  30 32 34*  GLUCOSE 225*  --  107* 106* 79  BUN 10  --  10 11 14   CREATININE 0.97   < > 0.93 0.92 1.22*  CALCIUM 8.5*  --  8.3* 8.4* 8.3*  PROT 6.3*  --   --   --   --   ALBUMIN 3.3*  --   --   --   --   AST 20  --   --   --   --   ALT 11  --   --   --   --   ALKPHOS 91  --   --   --   --  BILITOT 0.4  --   --   --   --   GFRNONAA 53*   < > 55* 56* 40*  GFRAA >60   < > >60 >60 46*  ANIONGAP 10  --  8 8 7    < > = values in this interval not displayed.     Hematology Recent Labs  Lab 07/05/18 2345 07/06/18 0720 07/07/18 0520  WBC 7.1 7.3 12.0*  RBC 3.73*  3.73* 3.68* 3.95  HGB 8.3* 8.1* 8.8*  HCT 29.0* 28.6* 30.6*  MCV 77.7* 77.7* 77.5*  MCH 22.3* 22.0* 22.3*  MCHC 28.6* 28.3* 28.8*  RDW 17.2* 17.3* 17.2*  PLT 285 289 303    Cardiac Enzymes Recent Labs  Lab 07/05/18 1918 07/05/18 2345 07/06/18 0720  TROPONINI 0.03* 0.03* 0.03*   No results for input(s): TROPIPOC in the last 168 hours.   BNP Recent Labs  Lab 07/05/18 2345  BNP 210.0*     DDimer No results for input(s): DDIMER in the last 168 hours.   Radiology    No results found.  Cardiac Studies   Echo 07/06/2018: Study Conclusions  - Left ventricle: The cavity size was normal. Systolic function was   normal. The estimated ejection fraction was in the range of 60%   to 65%. Wall motion was normal; there were no regional wall   motion abnormalities. Features are consistent with a pseudonormal   left ventricular filling pattern, with  concomitant abnormal   relaxation and increased filling pressure (grade 2 diastolic   dysfunction). - Mitral valve: Calcified annulus. Mildly thickened, mildly   calcified leaflets . There was mild regurgitation. - Left atrium: The atrium was mildly to moderately dilated. - Tricuspid valve: There was moderate regurgitation. - Pulmonary arteries: Systolic pressure was moderately increased.   PA peak pressure: 52 mm Hg (S). __________  Myoview 11/2017: Pharmacological myocardial perfusion imaging study with no significant  Ischemia Breast attenuation artifact noted Normal wall motion, EF estimated at 54% No EKG changes concerning for ischemia at peak stress or in recovery. Low risk scan  Patient Profile     84 y.o. female with history of chronic diastolic heart failure, type 2 diabetes, hypertension, hyperlipidemia and previous stroke who presented with nausea, vomiting and diarrhea and was found to be hypoxic and tachycardic on presentation with evidence of mild volume overload.  Assessment & Plan    1. Acute on chronic diastolic CHF/pulmonary hypertension: -Volume status appears to be improved -Wean oxygen as able -Hold IV Lasix (already received AM dose today) -If renal function allows, resume PO Lasix at 40 mg daily on 10/22  2. AKI: -Hold IV Lasix -If renal function allows, resume PO Lasix on 10/22 at 40 mg daily  3. Acute respiratory distress with hypoxia: -Wean oxygen as able  4. Anemia: -Likely exacerbating the above -Per IM   For questions or updates, please contact [place] Please consult [email or web address] for contact info under Cardiology/STEMI.    Signed, Christell Faith, PA-C [place] Pager: [phone] 07/08/2018, 10:52 AM

## 2018-07-08 NOTE — Care Management Important Message (Signed)
Important Message  Patient Details  Name: LARAE CAISON MRN: [id number] Date of Birth: 11-16-1932   Medicare Important Message Given:  Yes    Juliann Pulse A Tonni Mansour 07/08/2018, 11:01 AM

## 2018-07-08 NOTE — Progress Notes (Addendum)
Clinical Education officer, museum (CSW) met with patient and presented bed offers. Patient chose [facility]. CSW started [facility] SNF authorization. [facility] is requesting updated PT notes. PT is aware of above and will see patient today. [facility] admissions coordinator at [facility] is aware of accepted bed offer. CSW attempted to contact patient's daughter Estrella Alcaraz Mech however she did not answer and a voicemail was left. CSW will continue to follow and assist as needed.   Patient's daughter Livana Yerian Mech called CSW back and was made aware of above. CSW made daughter aware that patient will have a co-pay of $10-$20 per day starting day 1. Daughter is agreeable with plan.   [employer], LCSW [phone]

## 2018-07-08 NOTE — Clinical Social Work Placement (Signed)
   CLINICAL SOCIAL WORK PLACEMENT  NOTE  Date:  07/08/2018  Patient Details  Name: Diane Mullins MRN: [id number] Date of Birth: 01-20-1933  Clinical Social Work is seeking post-discharge placement for this patient at the [place] level of care (*CSW will initial, date and re-position this form in  chart as items are completed):  Yes   Patient/family provided with [facility] Work Department's list of facilities offering this level of care within the geographic area requested by the patient (or if unable, by the patient's family).  Yes   Patient/family informed of their freedom to choose among providers that offer the needed level of care, that participate in Medicare, Medicaid or managed care program needed by the patient, have an available bed and are willing to accept the patient.  Yes   Patient/family informed of [facility]'s ownership interest in [facility] and [facility], as well as of the fact that they are under no obligation to receive care at these facilities.  PASRR submitted to EDS on       PASRR number received on       Existing PASRR number confirmed on 07/06/18     FL2 transmitted to all facilities in geographic area requested by pt/family on 07/06/18     FL2 transmitted to all facilities within larger geographic area on       Patient informed that his/her managed care company has contracts with or will negotiate with certain facilities, including the following:        Yes   Patient/family informed of bed offers received.  Patient chooses bed at [facility] )     Physician recommends and patient chooses bed at      Patient to be transferred to   on  .  Patient to be transferred to facility by       Patient family notified on   of transfer.  Name of family member notified:        PHYSICIAN Please sign FL2, Please prepare prescriptions     Additional Comment:     _______________________________________________ Bionca Mckey, Veronia Beets, LCSW 07/08/2018, 11:28 AM

## 2018-07-08 NOTE — Progress Notes (Signed)
[facility] [place] NAME: Diane Mullins    MR#:  [id number]  DATE OF BIRTH:  08-04-1933  SUBJECTIVE:  CHIEF COMPLAINT:   Chief Complaint  Patient presents with  . Emesis  . Diarrhea   -remains on 2 L oxygen.  Complaints of fatigue and lethargy. -Diuresing well  REVIEW OF SYSTEMS:  Review of Systems  Constitutional: Negative for chills and fever.  HENT: Negative for congestion, ear discharge, hearing loss and nosebleeds.   Eyes: Negative for blurred vision and double vision.  Respiratory: Positive for shortness of breath. Negative for cough and wheezing.   Cardiovascular: Positive for leg swelling. Negative for chest pain and palpitations.  Gastrointestinal: Positive for nausea. Negative for abdominal pain, constipation, diarrhea and vomiting.  Genitourinary: Negative for dysuria.  Musculoskeletal: Negative for myalgias.  Neurological: Negative for dizziness, focal weakness, seizures, weakness and headaches.  Psychiatric/Behavioral: Negative for depression.    DRUG ALLERGIES:   Allergies  Allergen Reactions  . Oxycodone     Other reaction(s): Confusion Patient's daughter reported confusion/sensitivity to oxycodone.     VITALS:  Blood pressure (!) 131/49, pulse 61, temperature 98.3 F (36.8 C), temperature source Oral, resp. rate 14, height 5\' 1"  (1.549 m), weight 99.3 kg, SpO2 99 %.  PHYSICAL EXAMINATION:  Physical Exam  GENERAL:  84 y.o.-year-old elderly patient lying in the bed with no acute distress.  EYES: Pupils equal, round, reactive to light and accommodation. No scleral icterus. Extraocular muscles intact.  HEENT: Head atraumatic, normocephalic. Oropharynx and nasopharynx clear.  NECK:  Supple, no jugular venous distention. No thyroid enlargement, no tenderness.  LUNGS: Normal breath sounds bilaterally, no wheezing,  rhonchi or crepitation. Fine bibasilar crackles heard No use of accessory muscles of respiration.    CARDIOVASCULAR: S1, S2 normal. No  rubs, or gallops. 2/6 systolic murmur present ABDOMEN: Soft, nontender, nondistended. Bowel sounds present. No organomegaly or mass.  EXTREMITIES: No pedal edema, cyanosis, or clubbing.  NEUROLOGIC: Cranial nerves II through XII are intact. Muscle strength 5/5 in all extremities. Sensation intact. Gait not checked. Global weakness noted. PSYCHIATRIC: The patient is alert and oriented x 3.  SKIN: No obvious rash, lesion, or ulcer.    LABORATORY PANEL:   CBC Recent Labs  Lab 07/07/18 0520  WBC 12.0*  HGB 8.8*  HCT 30.6*  PLT 303   ------------------------------------------------------------------------------------------------------------------  Chemistries  Recent Labs  Lab 07/05/18 1034  07/08/18 0433  NA 139   < > 138  K 3.8   < > 3.6  CL 103   < > 97*  CO2 26   < > 34*  GLUCOSE 225*   < > 79  BUN 10   < > 14  CREATININE 0.97   < > 1.22*  CALCIUM 8.5*   < > 8.3*  AST 20  --   --   ALT 11  --   --   ALKPHOS 91  --   --   BILITOT 0.4  --   --    < > = values in this interval not displayed.   ------------------------------------------------------------------------------------------------------------------  Cardiac Enzymes Recent Labs  Lab 07/06/18 0720  TROPONINI 0.03*   ------------------------------------------------------------------------------------------------------------------  RADIOLOGY:  No results found.  EKG:   Orders placed or performed during the hospital encounter of 07/05/18  . ED EKG  . ED EKG    ASSESSMENT AND PLAN:   84 year old female from independent living facility, past medical history significant for diastolic CHF, anemia of  chronic disease, hypertension hyperlipidemia and diabetes mellitus presents to hospital secondary to worsening nausea vomiting, dizziness and dyspnea  1.  Acute hypoxic respiratory failure-secondary to acute on chronic diastolic CHF exacerbation. -On 2 L oxygen support which  is acute-wean as tolerated -Cardiology consult appreciated.  Received IV Lasix twice daily -Strict input and output monitoring, fluid restriction and daily weights. -Net negative of almost 3 L since admission.  Lasix will be held today due to slightly worsening renal function and metabolic alkalosis. -Echocardiogram with normal EF but grade 2 diastolic dysfunction and pulmonary hypertension. -Myoview done in March 2019 was negative.  Patient denies any chest pain.  2.  Acute gastroenteritis-CT of the abdomen large hiatal hernia. -No obstruction noted.   treat with antiemetics symptomatically for now and monitor closely. - improved  3.  Left ovarian dermoid tumor-OB/GYN consult appreciated.  Patient is status post hysterectomy -Outpatient follow-up.  Likely dermoid tumor.  Risk of torsion or rupture explained to family.  Pelvic ultrasound with Doppler flow can be ordered as outpatient. -Family history of ovarian cancer present though.  4.  Acute on chronic anemia- iron deficiency- IV iron for now - hb stable around 8- no indication for transfusion now - monitor  5. DM- amaryl, lantus, SSI -Sugars low this morning, morning dose of Amaryl held  6. HTN-blood pressure has been low today, likely volume depleted.  Discontinued losartan and decrease metoprolol dose.  Lasix also held today.    7. DVT Prophylaxis- lovenox  PT consulted- likely will need rehab at discharge Likely discharge tomorrow    All the records are reviewed and case discussed with Care Management/Social Workerr. Management plans discussed with the patient, family and they are in agreement.  CODE STATUS: Full Code  TOTAL TIME TAKING CARE OF THIS PATIENT: 82 minutes.   POSSIBLE D/C tomorrow, DEPENDING ON CLINICAL CONDITION.   Gladstone Lighter M.D on 07/08/2018 at 2:16 PM  Between 7am to 6pm - Pager - [phone]  After 6pm go to [email or web address] - password [email or web address] [facility]  Office   [phone]  CC: Primary care physician; [name], MD

## 2018-07-08 NOTE — Progress Notes (Signed)
[facility] SNF authorization has been received, authorization # [id number]. Plan is for patient to D/C to [facility]. [facility] admissions coordinator at [facility] is aware of above. Patient and her daughter Imraan Wendell Mech are aware of above. CSW will continue to follow and assist as needed.   [employer], LCSW [phone]

## 2018-07-09 ENCOUNTER — Inpatient Hospital Stay: Payer: PPO

## 2018-07-09 ENCOUNTER — Telehealth: Payer: Self-pay

## 2018-07-09 DIAGNOSIS — I509 Heart failure, unspecified: Secondary | ICD-10-CM | POA: Diagnosis not present

## 2018-07-09 DIAGNOSIS — R5383 Other fatigue: Secondary | ICD-10-CM | POA: Diagnosis present

## 2018-07-09 DIAGNOSIS — K529 Noninfective gastroenteritis and colitis, unspecified: Secondary | ICD-10-CM | POA: Diagnosis not present

## 2018-07-09 DIAGNOSIS — Z7982 Long term (current) use of aspirin: Secondary | ICD-10-CM | POA: Diagnosis not present

## 2018-07-09 DIAGNOSIS — J9601 Acute respiratory failure with hypoxia: Secondary | ICD-10-CM | POA: Diagnosis not present

## 2018-07-09 DIAGNOSIS — I10 Essential (primary) hypertension: Secondary | ICD-10-CM | POA: Diagnosis not present

## 2018-07-09 DIAGNOSIS — N179 Acute kidney failure, unspecified: Secondary | ICD-10-CM

## 2018-07-09 DIAGNOSIS — Z885 Allergy status to narcotic agent status: Secondary | ICD-10-CM | POA: Diagnosis not present

## 2018-07-09 DIAGNOSIS — R278 Other lack of coordination: Secondary | ICD-10-CM | POA: Diagnosis not present

## 2018-07-09 DIAGNOSIS — R262 Difficulty in walking, not elsewhere classified: Secondary | ICD-10-CM | POA: Diagnosis not present

## 2018-07-09 DIAGNOSIS — E785 Hyperlipidemia, unspecified: Secondary | ICD-10-CM | POA: Diagnosis not present

## 2018-07-09 DIAGNOSIS — E1159 Type 2 diabetes mellitus with other circulatory complications: Secondary | ICD-10-CM | POA: Diagnosis not present

## 2018-07-09 DIAGNOSIS — Z79890 Hormone replacement therapy (postmenopausal): Secondary | ICD-10-CM | POA: Diagnosis not present

## 2018-07-09 DIAGNOSIS — E119 Type 2 diabetes mellitus without complications: Secondary | ICD-10-CM | POA: Diagnosis not present

## 2018-07-09 DIAGNOSIS — J811 Chronic pulmonary edema: Secondary | ICD-10-CM | POA: Diagnosis not present

## 2018-07-09 DIAGNOSIS — E079 Disorder of thyroid, unspecified: Secondary | ICD-10-CM | POA: Diagnosis not present

## 2018-07-09 DIAGNOSIS — I5033 Acute on chronic diastolic (congestive) heart failure: Secondary | ICD-10-CM | POA: Diagnosis not present

## 2018-07-09 DIAGNOSIS — Z794 Long term (current) use of insulin: Secondary | ICD-10-CM | POA: Diagnosis not present

## 2018-07-09 DIAGNOSIS — E039 Hypothyroidism, unspecified: Secondary | ICD-10-CM | POA: Diagnosis not present

## 2018-07-09 DIAGNOSIS — Z8673 Personal history of transient ischemic attack (TIA), and cerebral infarction without residual deficits: Secondary | ICD-10-CM | POA: Diagnosis not present

## 2018-07-09 DIAGNOSIS — Z741 Need for assistance with personal care: Secondary | ICD-10-CM | POA: Diagnosis not present

## 2018-07-09 DIAGNOSIS — Z8249 Family history of ischemic heart disease and other diseases of the circulatory system: Secondary | ICD-10-CM | POA: Diagnosis not present

## 2018-07-09 DIAGNOSIS — Z833 Family history of diabetes mellitus: Secondary | ICD-10-CM | POA: Diagnosis not present

## 2018-07-09 DIAGNOSIS — Z79899 Other long term (current) drug therapy: Secondary | ICD-10-CM | POA: Diagnosis not present

## 2018-07-09 DIAGNOSIS — I5032 Chronic diastolic (congestive) heart failure: Secondary | ICD-10-CM | POA: Diagnosis not present

## 2018-07-09 DIAGNOSIS — Z7401 Bed confinement status: Secondary | ICD-10-CM | POA: Diagnosis not present

## 2018-07-09 DIAGNOSIS — I272 Pulmonary hypertension, unspecified: Secondary | ICD-10-CM | POA: Diagnosis not present

## 2018-07-09 DIAGNOSIS — M6281 Muscle weakness (generalized): Secondary | ICD-10-CM | POA: Diagnosis not present

## 2018-07-09 DIAGNOSIS — I110 Hypertensive heart disease with heart failure: Secondary | ICD-10-CM | POA: Diagnosis not present

## 2018-07-09 DIAGNOSIS — R197 Diarrhea, unspecified: Secondary | ICD-10-CM | POA: Diagnosis not present

## 2018-07-09 LAB — CBC
HCT: 31.3 % — ABNORMAL LOW (ref 36.0–46.0)
Hemoglobin: 8.8 g/dL — ABNORMAL LOW (ref 12.0–15.0)
MCH: 22.2 pg — ABNORMAL LOW (ref 26.0–34.0)
MCHC: 28.1 g/dL — ABNORMAL LOW (ref 30.0–36.0)
MCV: 78.8 fL — AB (ref 80.0–100.0)
PLATELETS: 331 10*3/uL (ref 150–400)
RBC: 3.97 MIL/uL (ref 3.87–5.11)
RDW: 16.9 % — AB (ref 11.5–15.5)
WBC: 7.7 10*3/uL (ref 4.0–10.5)
nRBC: 0.3 % — ABNORMAL HIGH (ref 0.0–0.2)

## 2018-07-09 LAB — BASIC METABOLIC PANEL
ANION GAP: 5 (ref 5–15)
BUN: 13 mg/dL (ref 8–23)
CALCIUM: 8.5 mg/dL — AB (ref 8.9–10.3)
CO2: 35 mmol/L — ABNORMAL HIGH (ref 22–32)
Chloride: 99 mmol/L (ref 98–111)
Creatinine, Ser: 0.98 mg/dL (ref 0.44–1.00)
GFR calc Af Amer: >60 mL/min (ref >60)
GFR, EST NON AFRICAN AMERICAN: 52 mL/min — AB (ref >60)
GLUCOSE: 100 mg/dL — AB (ref 70–99)
Potassium: 4 mmol/L (ref 3.5–5.1)
SODIUM: 139 mmol/L (ref 135–145)

## 2018-07-09 LAB — GLUCOSE, CAPILLARY
GLUCOSE-CAPILLARY: 69 mg/dL — AB (ref 70–99)
GLUCOSE-CAPILLARY: 110 mg/dL — AB (ref 70–99)
Glucose-Capillary: 129 mg/dL — ABNORMAL HIGH (ref 70–99)

## 2018-07-09 LAB — CA 125: Cancer Antigen (CA) 125: 50.8 U/mL — ABNORMAL HIGH (ref 0.0–38.1)

## 2018-07-09 MED ORDER — BISACODYL 10 MG RE SUPP
10.0000 mg | Freq: Every day | RECTAL | Status: DC | PRN
  Administered 2018-07-09: 10 mg via RECTAL
  Filled 2018-07-09: qty 1

## 2018-07-09 MED ORDER — INSULIN GLARGINE 100 UNIT/ML SOLOSTAR PEN: PEN_INJECTOR | SUBCUTANEOUS | 2 refills | Status: DC

## 2018-07-09 MED ORDER — FUROSEMIDE 20 MG PO TABS: 20.0000 mg | ORAL_TABLET | Freq: Every day | ORAL | Status: DC

## 2018-07-09 MED ORDER — METOPROLOL SUCCINATE ER 25 MG PO TB24: 25.0000 mg | ORAL_TABLET | Freq: Every day | ORAL | 2 refills | Status: DC

## 2018-07-09 MED ORDER — FUROSEMIDE 20 MG PO TABS: 20.0000 mg | ORAL_TABLET | Freq: Every day | ORAL | 2 refills | Status: DC

## 2018-07-09 MED ORDER — INSULIN GLARGINE 100 UNIT/ML ~~LOC~~ SOLN
12.0000 [IU] | Freq: Every day | SUBCUTANEOUS | Status: DC
  Filled 2018-07-09: qty 0.12

## 2018-07-09 MED ORDER — FUROSEMIDE 40 MG PO TABS
40.0000 mg | ORAL_TABLET | Freq: Every day | ORAL | Status: DC
  Administered 2018-07-09: 40 mg via ORAL
  Filled 2018-07-09: qty 1

## 2018-07-09 MED ORDER — DOCUSATE SODIUM 100 MG PO CAPS
100.0000 mg | ORAL_CAPSULE | Freq: Two times a day (BID) | ORAL | Status: DC
  Administered 2018-07-09: 100 mg via ORAL
  Filled 2018-07-09: qty 1

## 2018-07-09 MED ORDER — FLEET ENEMA 7-19 GM/118ML RE ENEM: 1.0000 | ENEMA | Freq: Once | RECTAL | Status: DC

## 2018-07-09 MED ORDER — POLYETHYLENE GLYCOL 3350 17 G PO PACK
17.0000 g | PACK | Freq: Every day | ORAL | Status: DC
  Administered 2018-07-09: 17 g via ORAL
  Filled 2018-07-09: qty 1

## 2018-07-09 MED ORDER — ACETAMINOPHEN 325 MG PO TABS: 650.0000 mg | ORAL_TABLET | Freq: Four times a day (QID) | ORAL | Status: DC | PRN

## 2018-07-09 MED ORDER — DOCUSATE SODIUM 100 MG PO CAPS: 100.0000 mg | ORAL_CAPSULE | Freq: Two times a day (BID) | ORAL | 0 refills | Status: DC

## 2018-07-09 NOTE — Progress Notes (Signed)
Inpatient Diabetes Program Recommendations  AACE/ADA: New Consensus Statement on Inpatient Glycemic Control (2015)  Target Ranges:  Prepandial:   less than 140 mg/dL      Peak postprandial:   less than 180 mg/dL (1-2 hours)      Critically ill patients:  140 - 180 mg/dL   Results for Diane Mullins, Diane Mullins (MRN [id number]) as of 07/09/2018 09:21  Ref. Range 07/08/2018 08:36 07/08/2018 12:13 07/08/2018 17:00 07/08/2018 21:07  Glucose-Capillary Latest Ref Range: 70 - 99 mg/dL 79 166 (H)  3 units NOVOLOG  108 (H) 205 (H)  2 units NOVOLOG +  17 units LANTUS    Results for Diane Mullins, Diane Mullins (MRN [id number]) as of 07/09/2018 09:21  Ref. Range 07/09/2018 08:25  Glucose-Capillary Latest Ref Range: 70 - 99 mg/dL 69 (L)    Admit with: Emesis/ Diarrhea  History: DM, CHF  Home DM Meds: Amaryl 4 mg QAM/ 2 mg QPM       Lantus 17 units QAM       Metformin 1000 mg BID  Current Orders: Lantus 17 units QHS      Novolog Moderate Correction Scale/ SSI (0-15 units) TID AC + HS      Amaryl 2 mg QPM      MD- Hypoglycemic this AM.  Please consider the following in-hospital insulin adjustments:  1. Reduce Lantus to 12 units QHS (75% total home dose)  2. Stop Amaryl 2 mg QPM for now     --Will follow patient during hospitalization--  Wyn Quaker RN, MSN, CDE Diabetes Coordinator Inpatient Glycemic Control Team Team Pager: [phone] (8a-5p)

## 2018-07-09 NOTE — Telephone Encounter (Signed)
Copied from [place] [id number]. Topic: Appointment Scheduling - Scheduling Inquiry for Clinic >> Jul 09, 2018  3:29 PM Berneta Levins wrote: Reason for CRM:   [facility] called to get pt scheduled for hospital follow up.  Put pt in first available slot 09/27/2018 with Dr. Caryl Bis.  States that pt is supposed to be seen within 3 days.  Please call pt to reschedule if she can be worked in.  Pt can be reached at [phone].

## 2018-07-09 NOTE — Progress Notes (Signed)
Called report to [facility]. Answered all questions.

## 2018-07-09 NOTE — Discharge Summary (Signed)
[facility] [place] NAME: Diane Mullins    MR#:  [id number]  DATE OF BIRTH:  [birth year]  DATE OF ADMISSION:  07/05/2018   ADMITTING PHYSICIAN: Avel Peace Salary, MD  DATE OF DISCHARGE: 07/09/18  PRIMARY CARE PHYSICIAN: [name], MD   ADMISSION DIAGNOSIS:   Diarrhea of presumed infectious origin [R19.7] Diverticulosis [K57.90] Aortic atherosclerosis ([place]) [I70.0] Acute respiratory failure with hypoxia ([place]) [J96.01] Generalized weakness [R53.1] Influenza-like illness [R69] Dermoid cyst of left ovary [D27.1]  DISCHARGE DIAGNOSIS:   Active Problems:   Respiratory failure (HCC)   Acute on chronic diastolic heart failure ([place])   SECONDARY DIAGNOSIS:   Past Medical History:  Diagnosis Date  . Benign paroxysmal positional vertigo 07/17/2014  . Diabetes mellitus   . H/O: rheumatic fever   . Hyperlipidemia   . Hypertension   . Stroke [facility])    TIA Jan. 1st  . Thyroid disease     HOSPITAL COURSE:   84 year old female from independent living facility, past medical history significant for diastolic CHF, anemia of chronic disease, hypertension hyperlipidemia and diabetes mellitus presents to hospital secondary to worsening nausea vomiting, dizziness and dyspnea  1.  Acute hypoxic respiratory failure-secondary to acute on chronic diastolic CHF exacerbation. -On 2 L oxygen support which is acute- wean as tolerated -Cardiology consulted.  received IV Lasix here- change to PO daily at disharge -Strict input and output monitoring, fluid restriction and daily weights. -Echocardiogram EF of 60%, has grade 2 diastolic dysfunction.  Moderate tricuspid regurgitation and elevated pulmonary artery pressures.  2.  Acute gastroenteritis-CT of the abdomen large hiatal hernia. -No obstruction noted.    Treated symptomatically. -No further symptoms  3.  Left ovarian dermoid tumor-OB/GYN consult appreciated.  Mostly dermoid tumor and  no further recommendations were done at this time. -Outpatient follow-up for pelvic ultrasound with Doppler flow..  Patient is status post hysterectomy  4.  Acute on chronic anemia- iron deficiency- -received IV iron in the hospital.  Hemoglobin is stable greater than 8.  Continue to follow-up as outpatient  5. DM-due to hypoglycemic episodes, Amaryl discontinued in the hospital.  Lantus dose has been decreased.  6. HTN-with the IV diuresis, her blood pressures have been low.  So losartan discontinued.  Metoprolol dose has been decreased.  Continue to monitor as outpatient and restart meds if needed.   PT consulted-will need rehab at discharge Daughter updated Likely discharge to [facility] today.    DISCHARGE CONDITIONS:   Guarded  CONSULTS OBTAINED:   Treatment Team:  Harlin Heys, MD Wellington Hampshire, MD  DRUG ALLERGIES:   Allergies  Allergen Reactions  . Oxycodone     Other reaction(s): Confusion Patient's daughter reported confusion/sensitivity to oxycodone.    DISCHARGE MEDICATIONS:   Allergies as of 07/09/2018      Reactions   Oxycodone    Other reaction(s): Confusion Patient's daughter reported confusion/sensitivity to oxycodone.      Medication List    STOP taking these medications   glimepiride 4 MG tablet Commonly known as:  AMARYL   losartan 50 MG tablet Commonly known as:  COZAAR     TAKE these medications   acetaminophen 325 MG tablet Commonly known as:  TYLENOL Take 2 tablets (650 mg total) by mouth every 6 (six) hours as needed for mild pain (or Fever >/= 101).   aspirin EC 81 MG tablet Take 81 mg by mouth daily.   docusate sodium 100 MG capsule  Commonly known as:  COLACE Take 1 capsule (100 mg total) by mouth 2 (two) times daily.   furosemide 20 MG tablet Commonly known as:  LASIX Take 1 tablet (20 mg total) by mouth daily. Start taking on:  07/10/2018 What changed:    how much to take  how to take this  when to  take this  additional instructions   glucose blood test strip Use as instructed to check Blood sugar three times a day.  E11.65 Dispense patient choice.   Insulin Glargine 100 UNIT/ML Solostar Pen Commonly known as:  LANTUS INJECT  12 UNITS SUBCUTANEOUSLY IN THE MORNING What changed:  additional instructions   levothyroxine 75 MCG tablet Commonly known as:  SYNTHROID, LEVOTHROID Take 1 tablet (75 mcg total) by mouth daily.   metFORMIN 1000 MG tablet Commonly known as:  GLUCOPHAGE Take 1 tablet (1,000 mg total) by mouth 2 (two) times daily with a meal. TAKE 1 TABLET TWICE DAILY  WITH  A  MEAL   metoprolol succinate 25 MG 24 hr tablet Commonly known as:  TOPROL-XL Take 1 tablet (25 mg total) by mouth daily. Start taking on:  07/10/2018 What changed:    medication strength  how much to take   omeprazole 20 MG capsule Commonly known as:  PRILOSEC Take 1 capsule (20 mg total) by mouth daily.   ondansetron 4 MG disintegrating tablet Commonly known as:  ZOFRAN-ODT Take 1 tablet (4 mg total) by mouth every 8 (eight) hours as needed for nausea or vomiting.   Pen Needles 32G X 4 MM Misc Inject 1 Dose into the skin daily.   potassium chloride 10 MEQ tablet Commonly known as:  K-DUR,KLOR-CON Take 1 tablet (10 mEq total) by mouth daily.   simvastatin 40 MG tablet Commonly known as:  ZOCOR Take 1 tablet (40 mg total) by mouth every evening.        DISCHARGE INSTRUCTIONS:   1. PCP f/u in 1-2 weeks 2. Cardiology f/u in 1-2 weeks 3. ObGyn f/u in 2-3 weeks  DIET:   Cardiac diet  ACTIVITY:   Activity as tolerated  OXYGEN:   Home Oxygen: Yes.    Oxygen Delivery: 1-2 liters/min via Patient connected to nasal cannula oxygen  DISCHARGE LOCATION:   nursing home   If you experience worsening of your admission symptoms, develop shortness of breath, life threatening emergency, suicidal or homicidal thoughts you must seek medical attention immediately by calling 911 or  calling your MD immediately  if symptoms less severe.  You Must read complete instructions/literature along with all the possible adverse reactions/side effects for all the Medicines you take and that have been prescribed to you. Take any new Medicines after you have completely understood and accpet all the possible adverse reactions/side effects.   Please note  You were cared for by a hospitalist during your hospital stay. If you have any questions about your discharge medications or the care you received while you were in the hospital after you are discharged, you can call the unit and asked to speak with the hospitalist on call if the hospitalist that took care of you is not available. Once you are discharged, your primary care physician will handle any further medical issues. Please note that NO REFILLS for any discharge medications will be authorized once you are discharged, as it is imperative that you return to your primary care physician (or establish a relationship with a primary care physician if you do not have one) for your aftercare needs  so that they can reassess your need for medications and monitor your lab values.    On the day of Discharge:  VITAL SIGNS:   Blood pressure (!) 145/46, pulse 66, temperature 97.6 F (36.4 C), resp. rate 18, height 5\' 1"  (1.549 m), weight 101.6 kg, SpO2 98 %.  PHYSICAL EXAMINATION:   GENERAL:  84 y.o.-year-old elderly patient lying in the bed with no acute distress.  EYES: Pupils equal, round, reactive to light and accommodation. No scleral icterus. Extraocular muscles intact.  HEENT: Head atraumatic, normocephalic. Oropharynx and nasopharynx clear.  NECK:  Supple, no jugular venous distention. No thyroid enlargement, no tenderness.  LUNGS: Normal breath sounds bilaterally, no wheezing,  rhonchi or crepitation. Fine bibasilar crackles heard No use of accessory muscles of respiration.  CARDIOVASCULAR: S1, S2 normal. No  rubs, or gallops. 2/6  systolic murmur present ABDOMEN: Soft, nontender, nondistended. Bowel sounds present. No organomegaly or mass.  EXTREMITIES: No pedal edema, cyanosis, or clubbing.  NEUROLOGIC: Cranial nerves II through XII are intact. Muscle strength 5/5 in all extremities. Sensation intact. Gait not checked. Global weakness noted. PSYCHIATRIC: The patient is alert and oriented x 3.  SKIN: No obvious rash, lesion, or ulcer. Marland Kitchen   DATA REVIEW:   CBC Recent Labs  Lab 07/09/18 0341  WBC 7.7  HGB 8.8*  HCT 31.3*  PLT 331    Chemistries  Recent Labs  Lab 07/05/18 1034  07/09/18 0341  NA 139   < > 139  K 3.8   < > 4.0  CL 103   < > 99  CO2 26   < > 35*  GLUCOSE 225*   < > 100*  BUN 10   < > 13  CREATININE 0.97   < > 0.98  CALCIUM 8.5*   < > 8.5*  AST 20  --   --   ALT 11  --   --   ALKPHOS 91  --   --   BILITOT 0.4  --   --    < > = values in this interval not displayed.     Microbiology Results  Results for orders placed or performed during the hospital encounter of 09/24/17  Urine culture     Status: Abnormal   Collection Time: 09/24/17  9:45 AM  Result Value Ref Range Status   Specimen Description   Final    URINE, CLEAN CATCH Performed at [facility], [address], [place], [place] [address]    Special Requests   Final    NONE Performed at [facility], [address] [place],  [address]    Culture MULTIPLE SPECIES PRESENT, SUGGEST RECOLLECTION (A)  Final   Report Status 09/25/2017 FINAL  Final    RADIOLOGY:  Dg Chest 2 View  Result Date: 07/09/2018 CLINICAL DATA:  Congestive heart failure. EXAM: CHEST - 2 VIEW COMPARISON:  Radiograph of July 05, 2018. FINDINGS: Stable cardiomegaly and central pulmonary vascular congestion is noted. No pneumothorax is noted. Stable bilateral pulmonary edema is noted. Bony thorax is unremarkable. IMPRESSION: Stable cardiomegaly and central pulmonary vascular congestion. Stable bilateral pulmonary edema.  Electronically Signed   By: Marijo Conception, M.D.   On: 07/09/2018 09:44     Management plans discussed with the patient, family and they are in agreement.  CODE STATUS:     Code Status Orders  (From admission, onward)         Start     Ordered   07/05/18 2207  Full code  Continuous     07/05/18 2206        Code Status History    Date Active Date Inactive Code Status Order ID Comments User Context   10/28/2015 1501 10/29/2015 1718 Full Code [id number]  Henreitta Leber, MD Inpatient   09/18/2015 1639 09/20/2015 2243 Full Code [id number]  Gladstone Lighter, MD Inpatient    Advance Directive Documentation     Most Recent Value  Type of Advance Directive  Healthcare Power of [place], Living will  Pre-existing out of facility DNR order (yellow form or pink MOST form)  -  "MOST" Form in Place?  -      TOTAL TIME TAKING CARE OF THIS PATIENT: 38  minutes.    Gladstone Lighter M.D on 07/09/2018 at 1:52 PM  Between 7am to 6pm - Pager - [phone]  After 6pm go to [email or web address] - [employer]  Hospitalists  Office  [phone]  CC: Primary care physician; [name], MD   Note: This dictation was prepared with Dragon dictation along with smaller phrase technology. Any transcriptional errors that result from this process are unintentional.

## 2018-07-09 NOTE — Clinical Social Work Placement (Signed)
   CLINICAL SOCIAL WORK PLACEMENT  NOTE  Date:  07/09/2018  Patient Details  Name: Diane Mullins MRN: [id number] Date of Birth: 29-Dec-1932  Clinical Social Work is seeking post-discharge placement for this patient at the [place] level of care (*CSW will initial, date and re-position this form in  chart as items are completed):  Yes   Patient/family provided with [facility] Work Department's list of facilities offering this level of care within the geographic area requested by the patient (or if unable, by the patient's family).  Yes   Patient/family informed of their freedom to choose among providers that offer the needed level of care, that participate in Medicare, Medicaid or managed care program needed by the patient, have an available bed and are willing to accept the patient.  Yes   Patient/family informed of [facility]'s ownership interest in [facility] and [facility], as well as of the fact that they are under no obligation to receive care at these facilities.  PASRR submitted to EDS on       PASRR number received on       Existing PASRR number confirmed on 07/06/18     FL2 transmitted to all facilities in geographic area requested by pt/family on 07/06/18     FL2 transmitted to all facilities within larger geographic area on       Patient informed that his/her managed care company has contracts with or will negotiate with certain facilities, including the following:        Yes   Patient/family informed of bed offers received.  Patient chooses bed at [facility] )     Physician recommends and patient chooses bed at      Patient to be transferred to [facility] ) on 07/09/18.  Patient to be transferred to facility by [facility] )     Patient family notified on 07/09/18 of transfer.  Name of family member notified:  (Patient's daughter Diane Mullins is aware of D/C today. )     PHYSICIAN       Additional  Comment:    _______________________________________________ Laelah Siravo, Veronia Beets, LCSW 07/09/2018, 2:38 PM

## 2018-07-09 NOTE — Progress Notes (Signed)
Progress Note  Patient Name: Diane Mullins Date of Encounter: 07/09/2018  Primary Cardiologist: Dr. Rockey Situ  Subjective   No c/o chest pain, palpitations, or rapid heart rate.   Does not appear grossly volume overloaded - Legs felt crampy last night. She does not reportedly feel swollen in her feet or legs, but she continues to feel fatigued today. She reported that she had low BP during ambulation yesterday and moved from chair to bed. No SOB on oxygen unless ambulating or without [place]. Small nose bleed last night reported d/t drying of nasal passages with [place].   We discussed the meaning of HFpEF and symptoms of low Hgb, including fatigue and feeling SOB.   She reported she does not feel ready for discharge at this time.   Inpatient Medications    Scheduled Meds: . aspirin EC  81 mg Oral Daily  . enoxaparin (LOVENOX) injection  40 mg Subcutaneous Q12H  . furosemide  40 mg Oral Daily  . glimepiride  2 mg Oral Q supper  . insulin aspart  0-15 Units Subcutaneous TID WC  . insulin aspart  0-5 Units Subcutaneous QHS  . insulin glargine  17 Units Subcutaneous Q2200  . lactobacillus  1 g Oral TID WC  . levothyroxine  75 mcg Oral QAC breakfast  . metoprolol succinate  25 mg Oral Daily  . multivitamin with minerals  1 tablet Oral Daily  . pantoprazole  40 mg Oral Daily  . potassium chloride  10 mEq Oral Daily  . ramelteon  8 mg Oral QHS  . simvastatin  40 mg Oral QPM   Continuous Infusions:  PRN Meds: acetaminophen **OR** acetaminophen, ipratropium-albuterol, nitroGLYCERIN, prochlorperazine   Vital Signs    Vitals:   07/08/18 1109 07/08/18 1659 07/09/18 0016 07/09/18 0446  BP: (!) 131/49 (!) 136/42 (!) 131/46   Pulse: 61 63 68   Resp:      Temp:  98.4 F (36.9 C) 98.7 F (37.1 C)   TempSrc:  Oral    SpO2: 99% 100% 100%   Weight:    101.6 kg  Height:        Intake/Output Summary (Last 24 hours) at 07/09/2018 0756 Last data filed at 07/09/2018 0018 Gross per 24 hour   Intake 480 ml  Output 902 ml  Net -422 ml   Filed Weights   07/07/18 0551 07/08/18 0517 07/09/18 0446  Weight: 99.6 kg 99.3 kg 101.6 kg    Telemetry    n/a - Personally Reviewed  ECG    n/a - Personally Reviewed  Physical Exam   GEN: No acute distress. On [place]  Neck: No JVD. Cardiac: RRR, no murmurs, rubs, or gallops.  Respiratory: Faint wheezing bilaterally.  GI: Soft, nontender, non-distended.   MS: No LEE; No deformity. Neuro:  Alert and oriented x 3; Nonfocal.  Psych: Normal affect.  Labs    Chemistry Recent Labs  Lab 07/05/18 1034  07/07/18 0520 07/08/18 0433 07/09/18 0341  NA 139   < > 140 138 139  K 3.8   < > 3.7 3.6 4.0  CL 103   < > 100 97* 99  CO2 26   < > 32 34* 35*  GLUCOSE 225*   < > 106* 79 100*  BUN 10   < > 11 14 13   CREATININE 0.97   < > 0.92 1.22* 0.98  CALCIUM 8.5*   < > 8.4* 8.3* 8.5*  PROT 6.3*  --   --   --   --  ALBUMIN 3.3*  --   --   --   --   AST 20  --   --   --   --   ALT 11  --   --   --   --   ALKPHOS 91  --   --   --   --   BILITOT 0.4  --   --   --   --   GFRNONAA 53*   < > 56* 40* 52*  GFRAA >60   < > >60 46* >60  ANIONGAP 10   < > 8 7 5    < > = values in this interval not displayed.     Hematology Recent Labs  Lab 07/06/18 0720 07/07/18 0520 07/09/18 0341  WBC 7.3 12.0* 7.7  RBC 3.68* 3.95 3.97  HGB 8.1* 8.8* 8.8*  HCT 28.6* 30.6* 31.3*  MCV 77.7* 77.5* 78.8*  MCH 22.0* 22.3* 22.2*  MCHC 28.3* 28.8* 28.1*  RDW 17.3* 17.2* 16.9*  PLT 289 303 331    Cardiac Enzymes Recent Labs  Lab 07/05/18 1918 07/05/18 2345 07/06/18 0720  TROPONINI 0.03* 0.03* 0.03*   No results for input(s): TROPIPOC in the last 168 hours.   BNP Recent Labs  Lab 07/05/18 2345  BNP 210.0*     DDimer No results for input(s): DDIMER in the last 168 hours.   Radiology    No results found.  Cardiac Studies   07/06/2018: TTE Study Conclusions  - Left ventricle: The cavity size was normal. Systolic function was    normal. The estimated ejection fraction was in the range of 60%   to 65%. Wall motion was normal; there were no regional wall   motion abnormalities. Features are consistent with a pseudonormal   left ventricular filling pattern, with concomitant abnormal   relaxation and increased filling pressure (grade 2 diastolic   dysfunction). - Mitral valve: Calcified annulus. Mildly thickened, mildly   calcified leaflets . There was mild regurgitation. - Left atrium: The atrium was mildly to moderately dilated. - Tricuspid valve: There was moderate regurgitation. - Pulmonary arteries: Systolic pressure was moderately increased.   PA peak pressure: 52 mm Hg (S). __________  Myoview 11/2017: Pharmacological myocardial perfusion imaging study with no significant  Ischemia Breast attenuation artifact noted Normal wall motion, EF estimated at 54% No EKG changes concerning for ischemia at peak stress or in recovery. Low risk scan  Patient Profile     84 y.o. female with history of chronic diastolic heart failure, type 2 diabetes, hypertension, hyperlipidemia and previous stroke who presented with nausea, vomiting and diarrhea and was found to be hypoxic and tachycardic on presentation with evidence of mild volume overload.  Assessment & Plan    1. Acute on chronic diastolic HFpEF/pulmonary hypertension: - 10/19 echo as above. - Does not appear grossly volume overloaded on exam - Continue to wean oxygen as able - Cr 0.98.  - Resume PO Lasix at 40 mg daily  - Daily BMET  2. AKI: - Renal function improved and at baseline - Lasix 40 mg daily as above  3. Acute respiratory distress with hypoxia: - Wean oxygen as able  4. Anemia: - Hgb 8.8 with recommendation for transfusion if below 8 - Likely exacerbating the above - Per IM   For questions or updates, please contact [place] Please consult [email or web address] for contact info under Cardiology/STEMI.    Signed, Arvil Chaco,  PA-C 07/09/2018, 8:01 AM Pager [phone]

## 2018-07-09 NOTE — Progress Notes (Signed)
Patient is medically stable for D/C to [facility] today. [facility] authorization has been received. Per Seth Bake admissions coordinator at [facility] patient can come today to room 222. RN will call report at [phone] and arrange EMS for transport. Clinical Education officer, museum (CSW) sent D/C orders to [facility]. Patient is aware of above. Patient's daughter Shivan Hodes Mech is aware of above. Please reconsult if future social work needs arise. CSW signing off.   [employer], LCSW [phone]

## 2018-07-09 NOTE — Plan of Care (Signed)
Will discharge to SNF for temporary rehab

## 2018-07-10 NOTE — Telephone Encounter (Signed)
Patient Discharged to [facility] skill nursing facility will follow .

## 2018-07-10 NOTE — Telephone Encounter (Signed)
Noted. We will await d/c from SNF.

## 2018-07-11 DIAGNOSIS — I5033 Acute on chronic diastolic (congestive) heart failure: Secondary | ICD-10-CM | POA: Diagnosis not present

## 2018-07-11 DIAGNOSIS — K529 Noninfective gastroenteritis and colitis, unspecified: Secondary | ICD-10-CM | POA: Diagnosis not present

## 2018-07-11 DIAGNOSIS — I272 Pulmonary hypertension, unspecified: Secondary | ICD-10-CM | POA: Diagnosis not present

## 2018-07-11 DIAGNOSIS — I10 Essential (primary) hypertension: Secondary | ICD-10-CM | POA: Diagnosis not present

## 2018-07-11 DIAGNOSIS — E1159 Type 2 diabetes mellitus with other circulatory complications: Secondary | ICD-10-CM | POA: Diagnosis not present

## 2018-07-16 ENCOUNTER — Other Ambulatory Visit: Payer: Self-pay | Admitting: *Deleted

## 2018-07-16 NOTE — Patient Outreach (Signed)
[facility]) Care Management  07/16/2018  ILEE RANDLEMAN 07-Apr-1933 [id number]   Spoke with Burnett Sheng discharge planner at [facility] facility.  Seth Bake states patient plans to discharge back to her previous home at [place] in approximately a week.  Seth Bake confirms this ALF provides transportation, medication assistance, meals and nursing assistance. No CM needs assessed at this time.   Rutherford Limerick RN, BSN [place] Acute Care Coordinator [phone]) Business Mobile [phone]) Toll free office

## 2018-07-18 ENCOUNTER — Ambulatory Visit: Payer: PPO | Attending: Family | Admitting: Family

## 2018-07-18 ENCOUNTER — Encounter: Payer: Self-pay | Admitting: Family

## 2018-07-18 VITALS — BP 134/68 | HR 77 | Resp 18 | Ht 61.0 in | Wt 211.5 lb

## 2018-07-18 DIAGNOSIS — I10 Essential (primary) hypertension: Secondary | ICD-10-CM

## 2018-07-18 DIAGNOSIS — E079 Disorder of thyroid, unspecified: Secondary | ICD-10-CM | POA: Diagnosis not present

## 2018-07-18 DIAGNOSIS — Z885 Allergy status to narcotic agent status: Secondary | ICD-10-CM | POA: Diagnosis not present

## 2018-07-18 DIAGNOSIS — Z79899 Other long term (current) drug therapy: Secondary | ICD-10-CM | POA: Diagnosis not present

## 2018-07-18 DIAGNOSIS — Z794 Long term (current) use of insulin: Secondary | ICD-10-CM | POA: Insufficient documentation

## 2018-07-18 DIAGNOSIS — Z8249 Family history of ischemic heart disease and other diseases of the circulatory system: Secondary | ICD-10-CM | POA: Insufficient documentation

## 2018-07-18 DIAGNOSIS — Z79890 Hormone replacement therapy (postmenopausal): Secondary | ICD-10-CM | POA: Diagnosis not present

## 2018-07-18 DIAGNOSIS — E785 Hyperlipidemia, unspecified: Secondary | ICD-10-CM | POA: Insufficient documentation

## 2018-07-18 DIAGNOSIS — I110 Hypertensive heart disease with heart failure: Secondary | ICD-10-CM | POA: Diagnosis not present

## 2018-07-18 DIAGNOSIS — Z7982 Long term (current) use of aspirin: Secondary | ICD-10-CM | POA: Diagnosis not present

## 2018-07-18 DIAGNOSIS — E1122 Type 2 diabetes mellitus with diabetic chronic kidney disease: Secondary | ICD-10-CM

## 2018-07-18 DIAGNOSIS — Z833 Family history of diabetes mellitus: Secondary | ICD-10-CM | POA: Insufficient documentation

## 2018-07-18 DIAGNOSIS — E119 Type 2 diabetes mellitus without complications: Secondary | ICD-10-CM | POA: Insufficient documentation

## 2018-07-18 DIAGNOSIS — I5032 Chronic diastolic (congestive) heart failure: Secondary | ICD-10-CM

## 2018-07-18 DIAGNOSIS — Z8673 Personal history of transient ischemic attack (TIA), and cerebral infarction without residual deficits: Secondary | ICD-10-CM | POA: Diagnosis not present

## 2018-07-18 DIAGNOSIS — N183 Chronic kidney disease, stage 3 (moderate): Secondary | ICD-10-CM

## 2018-07-18 NOTE — Patient Instructions (Signed)
Continue weighing daily and call for an overnight weight gain of > 2 pounds or a weekly weight gain of >5 pounds. 

## 2018-07-18 NOTE — Progress Notes (Signed)
Patient ID: Diane Mullins, female    DOB: December 08, 1932, 84 y.o.   MRN: [id number]  HPI  Diane Mullins is a 84 y/o female with a history of DM, hyperlipidemia, HTN, stroke, thyroid disease and chronic heart failure.   Echo report from 07/06/18 reviewed which showed an EF of 60-65% along with mild MR, moderate TR and a moderately increased PA pressure of 52 mm Hg.   Admitted 07/05/18 due to acute on chronic HF. Cardiology consult obtained. Received IV lasix and then transitioned to oral diuretics. Abdominal CT done due to acute gastroenteritis which showed large hiatal hernia. Received IV iron due to low hemoglobin. Discharged after 4 days.   She presents today for her initial visit with a chief complaint of moderate fatigue upon minimal exertion. She describes this as chronic in nature having been present for several years. She has associated cough, shortness of breath, pedal edema, palpitations and light-headedness along with this. She denies any difficulty sleeping, abdominal distention, chest pain or weight gain. Says that she feels pretty good and doesn't think she needs to be seen here.   Past Medical History:  Diagnosis Date  . Benign paroxysmal positional vertigo 07/17/2014  . CHF (congestive heart failure) ([place])   . Diabetes mellitus   . H/O: rheumatic fever   . Hyperlipidemia   . Hypertension   . Stroke [facility])    TIA Jan. 1st  . Thyroid disease    Past Surgical History:  Procedure Laterality Date  . ABDOMINAL HYSTERECTOMY  1973   menorrhagia  . HERNIA REPAIR  1994  . ORIF ANKLE FRACTURE Right 10/29/2015   Procedure: OPEN REDUCTION INTERNAL FIXATION (ORIF) ANKLE FRACTURE;  Surgeon: [place] Park, MD;  Location: [facility] ORS;  Service: Orthopedics;  Laterality: Right;  Marland Kitchen VAGINAL DELIVERY     x4   Family History  Problem Relation Age of Onset  . Heart disease Mother 101  . Heart attack Mother 33  . Cancer Father        colon  . Diabetes Sister   . Hypertension Sister   . Cancer  Brother        kidney  . Diabetes Brother   . Cancer Daughter        ovarian  . Non-Hodgkin's lymphoma Son    Social History   Tobacco Use  . Smoking status: Never Smoker  . Smokeless tobacco: Never Used  Substance Use Topics  . Alcohol use: No   Allergies  Allergen Reactions  . Oxycodone     Other reaction(s): Confusion Patient's daughter reported confusion/sensitivity to oxycodone.    Prior to Admission medications   Medication Sig Start Date End Date Taking? Authorizing Provider  acetaminophen (TYLENOL) 325 MG tablet Take 2 tablets (650 mg total) by mouth every 6 (six) hours as needed for mild pain (or Fever >/= 101). 07/09/18  Yes Gladstone Lighter, MD  aspirin EC 81 MG tablet Take 81 mg by mouth daily.   Yes [provider]  furosemide (LASIX) 20 MG tablet Take 1 tablet (20 mg total) by mouth daily. 07/10/18  Yes Gladstone Lighter, MD  glucose blood (ONE TOUCH ULTRA TEST) test strip Use as instructed to check Blood sugar three times a day.  E11.65 Dispense patient choice. 03/29/16  Yes Jackolyn Confer, MD  Insulin Glargine (LANTUS SOLOSTAR) 100 UNIT/ML Solostar Pen INJECT  12 UNITS SUBCUTANEOUSLY IN THE MORNING 07/09/18  Yes Gladstone Lighter, MD  Insulin Pen Needle (PEN NEEDLES) 32G X 4 MM MISC Inject  1 Dose into the skin daily. 12/19/16  Yes [name], MD  levothyroxine (SYNTHROID, LEVOTHROID) 75 MCG tablet Take 1 tablet (75 mcg total) by mouth daily. 04/09/18  Yes [name], MD  metFORMIN (GLUCOPHAGE) 1000 MG tablet Take 1 tablet (1,000 mg total) by mouth 2 (two) times daily with a meal. TAKE 1 TABLET TWICE DAILY  WITH  A  MEAL 04/09/18  Yes [name], MD  metoprolol succinate (TOPROL-XL) 25 MG 24 hr tablet Take 1 tablet (25 mg total) by mouth daily. 07/10/18  Yes Gladstone Lighter, MD  omeprazole (PRILOSEC) 20 MG capsule Take 1 capsule (20 mg total) by mouth daily. 04/09/18  Yes [name], MD  ondansetron (ZOFRAN ODT) 4 MG  disintegrating tablet Take 1 tablet (4 mg total) by mouth every 8 (eight) hours as needed for nausea or vomiting. 07/05/18  Yes Carrie Mew, MD  polyethylene glycol [facility] / Floria Raveling) packet Take 17 g by mouth daily.   Yes [provider]  potassium chloride SA (K-DUR,KLOR-CON) 10 MEQ tablet Take 1 tablet (10 mEq total) by mouth daily. 09/25/17  Yes [name], MD  simvastatin (ZOCOR) 40 MG tablet Take 1 tablet (40 mg total) by mouth every evening. 04/09/18  Yes [name], MD    Review of Systems  Constitutional: Positive for fatigue (tire easily). Negative for appetite change.  HENT: Negative for congestion, postnasal drip and sore throat.   Eyes: Negative.   Respiratory: Positive for cough (at times) and shortness of breath (minimal). Negative for chest tightness.   Cardiovascular: Positive for palpitations and leg swelling. Negative for chest pain.  Gastrointestinal: Negative for abdominal distention and abdominal pain.  Endocrine: Negative.   Genitourinary: Negative.   Musculoskeletal: Negative for back pain and neck pain.  Skin: Negative.   Allergic/Immunologic: Negative.   Neurological: Positive for light-headedness (at times). Negative for dizziness.  Hematological: Negative for adenopathy. Does not bruise/bleed easily.  Psychiatric/Behavioral: Negative for dysphoric mood and sleep disturbance (sleeping on 2 pillows). The patient is not nervous/anxious.     Vitals:   07/18/18 1225  BP: 134/68  Pulse: 77  Resp: 18  SpO2: 95%  Weight: 211 lb 8 oz (95.9 kg)  Height: 5\' 1"  (1.549 m)   Wt Readings from Last 3 Encounters:  07/18/18 211 lb 8 oz (95.9 kg)  07/09/18 223 lb 15.8 oz (101.6 kg)  04/09/18 217 lb (98.4 kg)   Lab Results  Component Value Date   CREATININE 0.98 07/09/2018   CREATININE 1.22 (H) 07/08/2018   CREATININE 0.92 07/07/2018   Physical Exam  Constitutional: She is oriented to person, place, and time. She appears well-developed  and well-nourished.  HENT:  Head: Normocephalic and atraumatic.  Neck: Normal range of motion. Neck supple. No JVD present.  Cardiovascular: Normal rate and regular rhythm.  Pulmonary/Chest: Effort normal. She has no wheezes. She has no rales.  Abdominal: Soft. She exhibits no distension. There is no tenderness.  Musculoskeletal: She exhibits no edema or tenderness.  Neurological: She is alert and oriented to person, place, and time.  Skin: Skin is warm and dry.  Psychiatric: She has a normal mood and affect. Her behavior is normal. Thought content normal.  Nursing note and vitals reviewed.   Assessment & Plan:  1: Chronic heart failure with preserved ejection fraction- - NYHA class III - euvolemic today - says that she's getting weighed daily and she was instructed to call for an overnight weight gain of >2 pounds  or a weekly weight gain of >5 pounds - not adding salt to her food. Reviewed the importance of reading food labels once she returns home so that she can follow a 2000mg  sodium diet. - currently receiving PT/OT at [facility] with the goal of returning home - saw cardiology Rockey Situ) 11/16/17 - BNP 07/05/18 was 210.0 - she reports receiving her flu vaccine for this season  2: HTN- - BP looks good today - saw PCP Caryl Bis) 04/09/18 - BMP from 07/09/18 reviewed and showed sodium 139, potassium 4.0, creatinine 0.98 and GFR 52  3: DM-  - glucose was 160 at [facility] today - A1c 07/05/18 was 8.1%  Facility medication list was reviewed.  Patient opts to not make a return appointment at this time. Advised her that she could call back at anytime to make another appointment.

## 2018-07-19 ENCOUNTER — Encounter: Payer: Self-pay | Admitting: Family

## 2018-07-23 ENCOUNTER — Encounter: Payer: PPO | Admitting: Obstetrics and Gynecology

## 2018-07-23 DIAGNOSIS — E1159 Type 2 diabetes mellitus with other circulatory complications: Secondary | ICD-10-CM | POA: Diagnosis not present

## 2018-07-23 DIAGNOSIS — I5032 Chronic diastolic (congestive) heart failure: Secondary | ICD-10-CM | POA: Diagnosis not present

## 2018-07-23 DIAGNOSIS — I10 Essential (primary) hypertension: Secondary | ICD-10-CM | POA: Diagnosis not present

## 2018-07-23 DIAGNOSIS — I272 Pulmonary hypertension, unspecified: Secondary | ICD-10-CM | POA: Diagnosis not present

## 2018-07-23 NOTE — Telephone Encounter (Addendum)
Diane Mullins contact 07/24/18 am contact  Diane Mullins pm contact 07/23/18 [facility] [phone]  patient is being discharged tomorrow from skilled nursing facility.  Patient will not be discharged until follow up appointment is made.

## 2018-07-24 NOTE — Telephone Encounter (Signed)
Ok to schedule follow up for 4:30 on 11/11.

## 2018-07-24 NOTE — Telephone Encounter (Signed)
Spoke to Hilda Blades at [facility] and advised that patient can been seen on 07/29/18 @4 :30. Patient placed on schedule.

## 2018-07-26 ENCOUNTER — Ambulatory Visit: Payer: PPO | Admitting: Family Medicine

## 2018-07-26 ENCOUNTER — Telehealth: Payer: Self-pay | Admitting: Family Medicine

## 2018-07-26 ENCOUNTER — Other Ambulatory Visit: Payer: Self-pay | Admitting: Pharmacist

## 2018-07-26 NOTE — Telephone Encounter (Signed)
I spoke with Jenny Reichmann from [facility] to give verbal orders.

## 2018-07-26 NOTE — Telephone Encounter (Signed)
Copied from [place] [id number]. Topic: General - Other >> Jul 26, 2018 11:48 AM Keene Breath wrote: Reason for CRM: Jenny Reichmann from [place] called to request authorization from PCP for PT and OT home health for patient which is requested from [facility] where patient will be discharged from.  Please advise.  CB# [phone]

## 2018-07-26 NOTE — Patient Outreach (Signed)
[facility]) Care Management  07/26/2018  Diane Mullins 05-04-1933 [id number]   84 year old female referred to [facility] for 30 day post discharge medication review.  PMHx includes, but not limited to, CHF, T2DM, hx TIA, recently admitted to [facility] 10/18-10/22/2019 for respiratory failure d/t acute on chronic diastolic heart failure. Discharged to [facility] SNF through 07/24/2018. [facility] - no medication changes were made during her time there.   Left HIPAA compliant message for patient to return my call.   Catie Darnelle Maffucci, PharmD PGY2 Ambulatory Care Pharmacy Resident, [facility] Phone: [phone]

## 2018-07-29 ENCOUNTER — Ambulatory Visit: Payer: PPO | Admitting: Family Medicine

## 2018-07-29 ENCOUNTER — Other Ambulatory Visit: Payer: Self-pay | Admitting: Pharmacist

## 2018-07-29 NOTE — Patient Outreach (Signed)
[facility]) Care Management  [place]   07/29/2018  Diane Mullins October 31, 1932 [id number]   84 year old female referred to [facility] for 30 day post discharge medication review. PMHx includes, but not limited to, CHF, T2DM, hx TIA, recently admitted to [facility] 10/18-10/22/2019 for respiratory failure d/t acute on chronic diastolic heart failure. Discharged to [facility] SNF through 07/24/2018.  Contacted patient to review medications; HIPAA verifiers identified.   She notes that she had changes to her diabetes regimen during hospitalizationShe , but has some confusion. Since returning home from SNF, she has continued to take glimepiride 4 mg QAM and 2 mg QPM as well as Lantus 15 units daily. She reports fasting SMBG in the 90s, no higher than 98, since returning home from SNF; she reports waking up in the middle of the night with symptoms of low blood sugar last night. She notes that while in SNF, fastings were 140s-150s, which are much higher than she is used to.  She has also been taking losartan 50 mg daily since she returned home from SNF. Reports BP 140/68 this morning, SBP  Consistently <150. She has been weighing daily and is aware to call with overnight gain > 2 lbs or weekly > 5 lbs.  She denies any concerns with constipation since returning home, so she has not been needing Miralax. She notes taking omeprazole every evening, but isn't sure if she really needs it that often. Denies needing ondansetron since discharge.   She notes that she rarely uses acetaminophen for pain, rather, uses ibuprofen. However, this is very infrequent. Objective: Lab Results  Component Value Date   CREATININE 0.98 07/09/2018   CREATININE 1.22 (H) 07/08/2018   CREATININE 0.92 07/07/2018    Lab Results  Component Value Date   HGBA1C 8.1 (H) 07/05/2018    Lipid Panel     Component Value Date/Time   CHOL 130 07/06/2018 0720   CHOL 177 09/15/2015 1000   TRIG  107 07/06/2018 0720   HDL 35 (L) 07/06/2018 0720   HDL 54 09/15/2015 1000   CHOLHDL 3.7 07/06/2018 0720   VLDL 21 07/06/2018 0720   LDLCALC 74 07/06/2018 0720   LDLCALC 88 09/15/2015 1000   LDLDIRECT 80.0 04/09/2018 1119    BP Readings from Last 3 Encounters:  07/18/18 134/68  07/09/18 (!) 136/50  04/09/18 138/70    Allergies  Allergen Reactions  . Oxycodone     Other reaction(s): Confusion Patient's daughter reported confusion/sensitivity to oxycodone.     Medications Reviewed Today    Reviewed by De Hollingshead, [facility] (Pharmacist) on 07/29/18 at 55  Med List Status: <None>  Medication Order Taking? Sig Documenting Provider Last Dose Status Informant  acetaminophen (TYLENOL) 325 MG tablet [id number] No Take 2 tablets (650 mg total) by mouth every 6 (six) hours as needed for mild pain (or Fever >/= 101).  Patient not taking:  Reported on 07/29/2018   Gladstone Lighter, MD Not Taking Active   aspirin EC 81 MG tablet [id number] Yes Take 81 mg by mouth daily. [provider] Taking Active Self  furosemide (LASIX) 20 MG tablet [id number] Yes Take 1 tablet (20 mg total) by mouth daily. Gladstone Lighter, MD Taking Active   glucose blood (ONE TOUCH ULTRA TEST) test strip [id number] Yes Use as instructed to check Blood sugar three times a day.  E11.65 Dispense patient choice. Jackolyn Confer, MD Taking Active Self  ibuprofen (ADVIL,MOTRIN) 200 MG tablet [id number] Yes  Take 200 mg by mouth every 6 (six) hours as needed. [provider] Taking Active   Insulin Glargine (LANTUS SOLOSTAR) 100 UNIT/ML Solostar Pen [id number] Yes INJECT  12 UNITS SUBCUTANEOUSLY IN THE Nash Mantis, MD Taking Active            Med Note Mayo Ao Jul 29, 2018 10:55 AM) Has been taking 15 units daily since discharge from SNF  Insulin Pen Needle (PEN NEEDLES) 32G X 4 MM MISC [id number] Yes Inject 1 Dose into the skin daily. [name], MD Taking Active  Self  levothyroxine (SYNTHROID, LEVOTHROID) 75 MCG tablet [id number] Yes Take 1 tablet (75 mcg total) by mouth daily. [name], MD Taking Active Self  magnesium oxide (MAG-OX) 400 MG tablet [id number] Yes Take 400 mg by mouth daily. [provider] Taking Active   metFORMIN (GLUCOPHAGE) 1000 MG tablet [id number] Yes Take 1 tablet (1,000 mg total) by mouth 2 (two) times daily with a meal. TAKE 1 TABLET TWICE DAILY  WITH  A  MEAL Sonnenberg, Angela Adam, MD Taking Active Self  metoprolol succinate (TOPROL-XL) 25 MG 24 hr tablet [id number] Yes Take 1 tablet (25 mg total) by mouth daily. Gladstone Lighter, MD Taking Active   omeprazole (PRILOSEC) 20 MG capsule [id number] Yes Take 1 capsule (20 mg total) by mouth daily. [name], MD Taking Active Self  ondansetron (ZOFRAN ODT) 4 MG disintegrating tablet [id number] No Take 1 tablet (4 mg total) by mouth every 8 (eight) hours as needed for nausea or vomiting.  Patient not taking:  Reported on 07/29/2018   Carrie Mew, MD Not Taking Active   polyethylene glycol [facility] / GLYCOLAX) packet [id number] No Take 17 g by mouth daily. [provider] Not Taking Active   potassium chloride SA (K-DUR,KLOR-CON) 10 MEQ tablet [id number] Yes Take 1 tablet (10 mEq total) by mouth daily. [name], MD Taking Active Self  simvastatin (ZOCOR) 40 MG tablet [id number] Yes Take 1 tablet (40 mg total) by mouth every evening. [name], MD Taking Active Self          Assessment:  Date Discharged from SNF: 07/24/2018 Date Medication Reconciliation Performed: 07/29/2018  Medications Discontinued at Hospital Discharge  Glimepiride  Losartan  New Medications at Hospital/SNF Discharge:  Metoprolol (dose decreased)  Furosemide (dose increased)  Magnesium  Potassium  Lantus (dose decreased)  Patient was recently discharged from hospital and all medications have been reviewed  Drugs sorted by  system:  Cardiovascular: aspirin, metoprolol, furosemide, metoprolol succinate, simvastatin, losartan  Gastrointestinal: omeprazole  Endocrine: Lantus, metformin, glimepiride  Pain: ibuprofen   Vitamins/Minerals/Supplements: magnesium, potassium  Medication Review Findings:  . Diabetes regimen: Concern for low blood sugars overnight, low fasting BG results. Recommended patient adhere to hospital discharge medication dosing, but to bring BG logs to f/u appointment with Philis Nettle, NP. Agree to continue holding glimepiride to prevent hypoglycemia, particularly overnight hypoglycemia. Moving forward, recommend asking patient to check a few 2 hour post prandial BG to determine if more prandial coverage is needed. . Losartan: Recommended patient continue to hold losartan until f/u with PCP to assess labwork, particularly with recent addition of potassium supplementation, to prevent hyperkalemia. Moving forward, if labwork normal, may be appropriate to restart at low doses, as patient's BP has been at high end of controlled since coming home, while taking losartan . Omeprazole: patient expressed that she "may not need" daily PPI therapy. Consider a trial without omeprazole therapy to see  if patient needs acid suppressing therapy   Plan: - Counseled patient on the importance of adherence to medications as prescribed.  - Will route note to PCP Dr. Caryl Bis, as well as Philis Nettle, NP, who will be seeing patient tomorrow for post-discharge follow up.  - I would be happy to see patient in clinic for diabetes moving forward, if patient amenable.   Catie Darnelle Maffucci, PharmD PGY2 Ambulatory Care Pharmacy Resident, [facility] Phone: [phone]

## 2018-07-30 ENCOUNTER — Encounter: Payer: Self-pay | Admitting: Family Medicine

## 2018-07-30 ENCOUNTER — Telehealth: Payer: Self-pay | Admitting: Family Medicine

## 2018-07-30 ENCOUNTER — Ambulatory Visit (INDEPENDENT_AMBULATORY_CARE_PROVIDER_SITE_OTHER): Payer: PPO | Admitting: Family Medicine

## 2018-07-30 ENCOUNTER — Telehealth: Payer: Self-pay

## 2018-07-30 VITALS — BP 154/78 | HR 66 | Temp 98.2°F | Ht 61.0 in | Wt 213.4 lb

## 2018-07-30 DIAGNOSIS — I5033 Acute on chronic diastolic (congestive) heart failure: Secondary | ICD-10-CM | POA: Diagnosis not present

## 2018-07-30 DIAGNOSIS — R531 Weakness: Secondary | ICD-10-CM

## 2018-07-30 DIAGNOSIS — Z87898 Personal history of other specified conditions: Secondary | ICD-10-CM

## 2018-07-30 DIAGNOSIS — Z7982 Long term (current) use of aspirin: Secondary | ICD-10-CM | POA: Diagnosis not present

## 2018-07-30 DIAGNOSIS — I130 Hypertensive heart and chronic kidney disease with heart failure and stage 1 through stage 4 chronic kidney disease, or unspecified chronic kidney disease: Secondary | ICD-10-CM | POA: Diagnosis not present

## 2018-07-30 DIAGNOSIS — E039 Hypothyroidism, unspecified: Secondary | ICD-10-CM | POA: Diagnosis not present

## 2018-07-30 DIAGNOSIS — Z6839 Body mass index (BMI) 39.0-39.9, adult: Secondary | ICD-10-CM | POA: Diagnosis not present

## 2018-07-30 DIAGNOSIS — M1991 Primary osteoarthritis, unspecified site: Secondary | ICD-10-CM | POA: Diagnosis not present

## 2018-07-30 DIAGNOSIS — I10 Essential (primary) hypertension: Secondary | ICD-10-CM | POA: Diagnosis not present

## 2018-07-30 DIAGNOSIS — E1122 Type 2 diabetes mellitus with diabetic chronic kidney disease: Secondary | ICD-10-CM | POA: Diagnosis not present

## 2018-07-30 DIAGNOSIS — N183 Chronic kidney disease, stage 3 (moderate): Secondary | ICD-10-CM | POA: Diagnosis not present

## 2018-07-30 DIAGNOSIS — Z794 Long term (current) use of insulin: Secondary | ICD-10-CM | POA: Diagnosis not present

## 2018-07-30 DIAGNOSIS — H8110 Benign paroxysmal vertigo, unspecified ear: Secondary | ICD-10-CM | POA: Diagnosis not present

## 2018-07-30 DIAGNOSIS — K529 Noninfective gastroenteritis and colitis, unspecified: Secondary | ICD-10-CM | POA: Diagnosis not present

## 2018-07-30 DIAGNOSIS — F4322 Adjustment disorder with anxiety: Secondary | ICD-10-CM | POA: Diagnosis not present

## 2018-07-30 DIAGNOSIS — D500 Iron deficiency anemia secondary to blood loss (chronic): Secondary | ICD-10-CM | POA: Diagnosis not present

## 2018-07-30 DIAGNOSIS — E1159 Type 2 diabetes mellitus with other circulatory complications: Secondary | ICD-10-CM

## 2018-07-30 DIAGNOSIS — I272 Pulmonary hypertension, unspecified: Secondary | ICD-10-CM | POA: Diagnosis not present

## 2018-07-30 DIAGNOSIS — Z8673 Personal history of transient ischemic attack (TIA), and cerebral infarction without residual deficits: Secondary | ICD-10-CM | POA: Diagnosis not present

## 2018-07-30 DIAGNOSIS — D271 Benign neoplasm of left ovary: Secondary | ICD-10-CM | POA: Diagnosis not present

## 2018-07-30 DIAGNOSIS — Z8709 Personal history of other diseases of the respiratory system: Secondary | ICD-10-CM

## 2018-07-30 DIAGNOSIS — R197 Diarrhea, unspecified: Secondary | ICD-10-CM

## 2018-07-30 DIAGNOSIS — K5790 Diverticulosis of intestine, part unspecified, without perforation or abscess without bleeding: Secondary | ICD-10-CM | POA: Diagnosis not present

## 2018-07-30 DIAGNOSIS — I2510 Atherosclerotic heart disease of native coronary artery without angina pectoris: Secondary | ICD-10-CM | POA: Diagnosis not present

## 2018-07-30 DIAGNOSIS — E785 Hyperlipidemia, unspecified: Secondary | ICD-10-CM | POA: Diagnosis not present

## 2018-07-30 DIAGNOSIS — Z9181 History of falling: Secondary | ICD-10-CM | POA: Diagnosis not present

## 2018-07-30 DIAGNOSIS — J9601 Acute respiratory failure with hypoxia: Secondary | ICD-10-CM

## 2018-07-30 LAB — CBC WITH DIFFERENTIAL/PLATELET
BASOS PCT: 0.9 % (ref 0.0–3.0)
Basophils Absolute: 0.1 10*3/uL (ref 0.0–0.1)
EOS ABS: 0.2 10*3/uL (ref 0.0–0.7)
Eosinophils Relative: 2.5 % (ref 0.0–5.0)
HCT: 35.3 % — ABNORMAL LOW (ref 36.0–46.0)
Hemoglobin: 10.9 g/dL — ABNORMAL LOW (ref 12.0–15.0)
LYMPHS ABS: 2.4 10*3/uL (ref 0.7–4.0)
Lymphocytes Relative: 35 % (ref 12.0–46.0)
MCHC: 31 g/dL (ref 30.0–36.0)
MCV: 79.4 fl (ref 78.0–100.0)
MONO ABS: 0.7 10*3/uL (ref 0.1–1.0)
Monocytes Relative: 10.4 % (ref 3.0–12.0)
NEUTROS ABS: 3.5 10*3/uL (ref 1.4–7.7)
NEUTROS PCT: 51.2 % (ref 43.0–77.0)
Platelets: 250 10*3/uL (ref 150.0–400.0)
RBC: 4.45 Mil/uL (ref 3.87–5.11)
RDW: 25.5 % — AB (ref 11.5–15.5)
WBC: 6.8 10*3/uL (ref 4.0–10.5)

## 2018-07-30 LAB — COMPREHENSIVE METABOLIC PANEL
ALBUMIN: 3.9 g/dL (ref 3.5–5.2)
ALT: 11 U/L (ref 0–35)
AST: 13 U/L (ref 0–37)
Alkaline Phosphatase: 98 U/L (ref 39–117)
BUN: 14 mg/dL (ref 6–23)
CHLORIDE: 102 meq/L (ref 96–112)
CO2: 30 mEq/L (ref 19–32)
Calcium: 9.2 mg/dL (ref 8.4–10.5)
Creatinine, Ser: 0.99 mg/dL (ref 0.40–1.20)
GFR: 56.82 mL/min — ABNORMAL LOW (ref >60.00)
Glucose, Bld: 156 mg/dL — ABNORMAL HIGH (ref 70–99)
POTASSIUM: 4.9 meq/L (ref 3.5–5.1)
Sodium: 139 mEq/L (ref 135–145)
Total Bilirubin: 0.3 mg/dL (ref 0.2–1.2)
Total Protein: 6.6 g/dL (ref 6.0–8.3)

## 2018-07-30 LAB — IRON,TIBC AND FERRITIN PANEL
%SAT: 21 % (calc) (ref 16–45)
Ferritin: 57 ng/mL (ref 16–288)
IRON: 74 ug/dL (ref 45–160)
TIBC: 345 ug/dL (ref 250–450)

## 2018-07-30 LAB — MAGNESIUM: MAGNESIUM: 1.6 mg/dL (ref 1.5–2.5)

## 2018-07-30 MED ORDER — GLUCOSE BLOOD VI STRP: ORAL_STRIP | 3 refills | Status: DC

## 2018-07-30 NOTE — Progress Notes (Signed)
Subjective:    Patient ID: Diane Mullins, female    DOB: 01-09-1933, 84 y.o.   MRN: [id number]  HPI  Patient presents to clinic for hospital follow up.  Discharge summary reviewed from inpatient chart: DATE OF ADMISSION:  07/05/2018    ADMITTING PHYSICIAN: Gorden Harms, MD  DATE OF DISCHARGE: 07/09/18  PRIMARY CARE PHYSICIAN: [name], MD   ADMISSION DIAGNOSIS:   Diarrhea of presumed infectious origin [R19.7] Diverticulosis [K57.90] Aortic atherosclerosis ([place]) [I70.0] Acute respiratory failure with hypoxia ([place]) [J96.01] Generalized weakness [R53.1] Influenza-like illness [R69] Dermoid cyst of left ovary [D27.1]  DISCHARGE DIAGNOSIS:   Active Problems:   Respiratory failure (H[facility])   Acute on chronic diastolic heart failure ([place])   SECONDARY DIAGNOSIS:       Past Medical History:  Diagnosis Date  . Benign paroxysmal positional vertigo 07/17/2014  . Diabetes mellitus   . H/O: rheumatic fever   . Hyperlipidemia   . Hypertension   . Stroke [facility])    TIA Jan. 1st  . Thyroid disease     HOSPITAL COURSE:   84 year old female from independent living facility, past medical history significant for diastolic CHF, anemia of chronic disease, hypertension hyperlipidemia and diabetes mellitus presents to hospital secondary to worsening nausea vomiting, dizziness and dyspnea  1. Acute hypoxic respiratory failure-secondary to acute on chronic diastolic CHF exacerbation. -On 2 L oxygen support which is acute- wean as tolerated -Cardiology consulted. received IV Lasix here- change to PO daily at disharge -Strict input and output monitoring, fluid restriction and daily weights. -Echocardiogram EF of 60%, has grade 2 diastolic dysfunction.  Moderate tricuspid regurgitation and elevated pulmonary artery pressures.  2. Acute gastroenteritis-CT of the abdomen large hiatal hernia. -No obstruction noted.   Treated symptomatically. -No further  symptoms  3. Left ovarian dermoid tumor-OB/GYN consult appreciated.  Mostly dermoid tumor and no further recommendations were done at this time. -Outpatient follow-up for pelvic ultrasound with Doppler flow.. Patient is status post hysterectomy  4. Acute on chronic anemia- iron deficiency- -received IV iron in the hospital.  Hemoglobin is stable greater than 8.  Continue to follow-up as outpatient  5. DM-due to hypoglycemic episodes, Amaryl discontinued in the hospital.  Lantus dose has been decreased.  6. HTN-with the IV diuresis, her blood pressures have been low.  So losartan discontinued.  Metoprolol dose has been decreased.  Continue to monitor as outpatient and restart meds if needed.  PT consulted-will need rehab at discharge Daughter updated Likely discharge to [facility] today.  Patient went to [facility] for rehab after discharge. She was discharged from SNF on 07/24/18. [facility] pharmacist Catie Darnelle Maffucci [facility] was able to speak with patient yesterday via phone (07/29/18) for medication review. Patient advised to hold glimepiride due to over night BG lows. She is also holding losartan until we check lab work.  When taking the glimepiride patient was giving blood sugar readings in the 90s, did have episode of overnight hypoglycemia where she felt shaky, this is why she was advised to stop the medication by pharmacist.  Blood pressure while taking the losartan has been running in the 130s over 60s; patient has been checking with her home cuff.  Patient has been eating and drinking normally.  Denies any issues of constipation (will take miralax on occasion if needed), diarrhea or issues with urination.  Patient denies any feelings of shortness of breath, denies chest pain, denies cough or wheezing.  Patient also has follow-up with cardiology planned for  08/02/2018.  Review of Systems  Constitutional: Negative for chills,  fever. States "I just feel wore out". HENT: Negative for  congestion, ear pain, sinus pain and sore throat.   Eyes: Negative.   Respiratory: Negative for cough, shortness of breath and wheezing.   Cardiovascular: Negative for chest pain, palpitations. Chronic leg swelling - on lasix Gastrointestinal: Negative for abdominal pain, diarrhea, nausea and vomiting.  Genitourinary: Negative for dysuria, frequency and urgency.  Musculoskeletal: Negative for arthralgias and myalgias.  Skin: Negative for color change, pallor and rash.  Neurological: Negative for syncope, light-headedness and headaches.  Psychiatric/Behavioral: The patient is not nervous/anxious.       Objective:   Physical Exam  Constitutional: She is oriented to person, place, and time. She appears well-nourished. No distress.  HENT:  Head: Normocephalic and atraumatic.  Eyes: Conjunctivae and EOM are normal. No scleral icterus.  Neck: Neck supple. No tracheal deviation present.  Cardiovascular: Normal rate, regular rhythm and normal heart sounds.  Trace LE edema in ankles, on lasix daily.   Pulmonary/Chest: Effort normal and breath sounds normal. No respiratory distress. She has no wheezes. She has no rales.  Abdominal: Soft. Bowel sounds are normal. She exhibits no distension. There is no tenderness. There is no rebound and no guarding.  Musculoskeletal:  Walks well with 4 wheeled walker  Neurological: She is alert and oriented to person, place, and time.  Skin: Skin is warm and dry. No erythema. No pallor.  Psychiatric: She has a normal mood and affect. Her behavior is normal.  Nursing note and vitals reviewed.     Vitals:   07/30/18 1043  BP: (!) 154/78  Pulse: 66  Temp: 98.2 F (36.8 C)  SpO2: 93%    Assessment & Plan:    Essential hypertension-patient currently is holding losartan until we get blood work back.  We are doing CBC, CMP, magnesium level in clinic today.  If electrolytes are shown to be stable with this lab work, we will plan to have her resume the  losartan 50 mg.  Patient was having the same BP readings at home while taking this.  Type 2 diabetes-patient will stop glimepiride, it was me blood sugars too low especially overnight.  She will continue to take Lantus 12 units/day and monitor sugar readings.  She will also continue current metformin dosing.  Generalized weakness-patient states the rehab she had at [facility] was helpful, she did feel that it helped her to feel better.  She still feels worn out overall, but is improving each day.  Diarrhea- diarrhea patient had while in hospital has resolved.  Appetite is back to normal.  Acute respiratory failure- patient's lungs are clear on exam, is in no respiratory distress at this time.  Respiratory failure to have occurred while in hospital has now resolved.  Patient will follow-up here in 1 month for recheck on chronic conditions and continued monitoring of blood pressure readings and also blood sugar numbers with recent medication changes.  Patient advised she can return to clinic sooner if any issues arise.

## 2018-07-30 NOTE — Telephone Encounter (Signed)
I spoke with Claiborne Billings from [facility] & gave verbal orders.

## 2018-07-30 NOTE — Telephone Encounter (Signed)
Copied from [place] [id number]. Topic: Quick Communication - Home Health Verbal Orders >> Jul 30, 2018  4:17 PM Berneta Levins wrote: Caller/Agency: Claiborne Billings with [facility] Number: [phone], OK to leave a message Requesting OT/PT/Skilled Nursing/Social Work: PT Frequency: 2week 5

## 2018-07-30 NOTE — Patient Instructions (Signed)
STOP glimepiride -- this was making your sugars too low  Take lantus 12 units per day and monitor sugar readings  HOLD losartan for now until we get lab work results back

## 2018-07-30 NOTE — Telephone Encounter (Signed)
Noted.  If they need verbal orders they can be given.

## 2018-07-30 NOTE — Telephone Encounter (Signed)
Sent to PCP for the OK for verbal orders  

## 2018-07-30 NOTE — Telephone Encounter (Signed)
Copied from [place] [id number]. Topic: General - Other >> Jul 30, 2018 10:23 AM Keene Breath wrote: Reason for CRM: Darlina Guys with [facility] called to inform the doctor that a PT will be going out to the patient's home today for home health PT.  Please advise.  CB# [phone]

## 2018-07-31 NOTE — Telephone Encounter (Signed)
Called [facility] and left a detailed VM with pt's name and DOB giving Kecia the [facility] for verbal orders. Left my name and call back number if needed. [phone]

## 2018-08-01 NOTE — Progress Notes (Signed)
Cardiology Office Note Date:  08/02/2018  Patient ID:  Diane, Mullins [birth year], MRN [id number] PCP:  [name], MD  Cardiologist:  Dr. Rockey Situ, MD    Chief Complaint: Hospital follow up  History of Present Illness: Diane Mullins is a 84 y.o. female with history of HFpEF/pulmonary hypertension, DM2, HTN, HLD, prior stroke and vertigo who presents for hospital follow up after recent admission to [facility] from 10/18 to 10/22 for acute respiratory distress in the setting of acute on chronic diastolic CHF, worsening anemia, diarrhea, and hypoalbuminemia.  Prior echo from 2017 showed an EF of 60-65%, no RWMA, Gr1DD, left atrium normal in size, RVSF normal, mild to moderate TR, PASP 35 mmHg. She was seen in ED in 09/2017 for significant SOB. CTA chest negative or PE, BNP 250. Treated for possible mild fluid overload. CTA chest images showed calcified LAD and RCA with mild aortic arch atherosclerosis. Follow up Myoview in 11/2017 showed no significant ischemia, EF 54%, low risk study.   Patient was admitted to [facility] 07/05/18 after being brought in from her nursing facility for nausea, vomiting, and diarrhea with associated dizziness. She was noted to by hypoxic upon arrival as well as tachycardic. She was found to have worsening anemia with a HGB of 8 (baseline ~ 10-11). 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 BNP was mildly elevated at 210. CXR showed pulmonary vascular congestion. Echo showed an EF of 60-65%, no RWMA, Gr2DD, calcified mitral annulus with mild MR, mildly to moderately dilated left atrium, moderate TR, PASP 52 mmHg. Her acute on chronic diastolic CHF was felt to be exacerbated by worsening anemia, hypoalbuminemia and gastroenteritis. Her anemia was treated with IV iron infusion. She was diuresed with slight bump in her renal function leading to holding of Lasix. She was ultimately discharged with a reported weight of 101.6 kg and on ASA 81 mg,  Lasix 20 mg daily, Toprol XL 25 mg daily, KCl 10 mEq daily, and simvastatin 40 mg, along with her non-cardiac medications.   Hospital labs: 06/2018 - HGB 8.8, K+ 4.0, SCr 0.98, LDL 74, troponin 0.03 x 3, A1c 8.1, TSH 0.888, albumin 3.3, AST 20, ALT 11  Seen by the [facility] 07/18/2018 with a weight of 95.9 kg. No changes made. Saw PCP 07/30/18 with a weight of 96.8 kg. Had been advised prior to that visit, by [facility], to hold losartan until follow up labs could be checked at PCP office. BP on losartan had been in the 130s/60s. BP at PCP office on 11/12 of 154/78. She noted generalized fatigue.   Labs: 07/30/2018 - Mg++ 1.6, K+ 4.9, SCr 0.99, glucose 156, albumin 3.9, AST 13, ALT 11, HGB 10.9  She was resumed on losartan 50 mg daily on 07/31/18 as well as KCl.   She comes in accompanied by her daughter today.  From a cardiac perspective, she is doing well.  She denies any shortness of breath, chest pain, dizziness, presyncope, or syncope.  Weight remains stable.  Tolerating all medications including aspirin, Lasix, losartan, and Toprol without issues.  Continues to note mild lower extremity swelling.  She does try to elevate her legs when sitting.  She does not want to wear compression stockings.  Patient's daughter has noted the patient has been dyspneic for several years and would like for the patient to be evaluated by pulmonology.  It remains unclear why she had acute on chronic anemia in the hospital.  She denies any BRBPR or melena.  She is not aware of any further evaluation of her anemia at this time.  No further diarrhea.   Past Medical History:  Diagnosis Date  . Benign paroxysmal positional vertigo 07/17/2014  . Chronic diastolic CHF (congestive heart failure) ([place])    a. echo 10/19: EF of 60-65%, no RWMA, Gr2DD, calcified mitral annulus with mild MR, mildly to moderately dilated left atrium, moderate TR, PASP 52 mmHg  . Diabetes mellitus   . H/O: rheumatic fever   .  Hyperlipidemia   . Hypertension   . Pulmonary hypertension ([place])   . Stroke [facility])    TIA Jan. 1st  . Thyroid disease     Past Surgical History:  Procedure Laterality Date  . ABDOMINAL HYSTERECTOMY  1973   menorrhagia  . HERNIA REPAIR  1994  . ORIF ANKLE FRACTURE Right 10/29/2015   Procedure: OPEN REDUCTION INTERNAL FIXATION (ORIF) ANKLE FRACTURE;  Surgeon: Thornton Park, MD;  Location: [facility] ORS;  Service: Orthopedics;  Laterality: Right;  Marland Kitchen VAGINAL DELIVERY     x4    Current Meds  Medication Sig  . aspirin EC 81 MG tablet Take 81 mg by mouth daily.  . furosemide (LASIX) 20 MG tablet Take 1 tablet (20 mg total) by mouth daily.  Marland Kitchen glucose blood (ONE TOUCH ULTRA TEST) test strip Use as instructed to check Blood sugar three times a day.  E11.65 Dispense patient choice.  Marland Kitchen ibuprofen (ADVIL,MOTRIN) 200 MG tablet Take 200 mg by mouth every 6 (six) hours as needed.  . Insulin Glargine (LANTUS SOLOSTAR) 100 UNIT/ML Solostar Pen INJECT  12 UNITS SUBCUTANEOUSLY IN THE MORNING  . Insulin Pen Needle (PEN NEEDLES) 32G X 4 MM MISC Inject 1 Dose into the skin daily.  Marland Kitchen levothyroxine (SYNTHROID, LEVOTHROID) 75 MCG tablet Take 1 tablet (75 mcg total) by mouth daily.  Marland Kitchen losartan (COZAAR) 50 MG tablet Take 50 mg by mouth daily.  . magnesium oxide (MAG-OX) 400 MG tablet Take 400 mg by mouth daily.  . metFORMIN (GLUCOPHAGE) 1000 MG tablet Take 1 tablet (1,000 mg total) by mouth 2 (two) times daily with a meal. TAKE 1 TABLET TWICE DAILY  WITH  A  MEAL  . metoprolol succinate (TOPROL-XL) 25 MG 24 hr tablet Take 1 tablet (25 mg total) by mouth daily.  Marland Kitchen omeprazole (PRILOSEC) 20 MG capsule Take 1 capsule (20 mg total) by mouth daily.  . ondansetron (ZOFRAN ODT) 4 MG disintegrating tablet Take 1 tablet (4 mg total) by mouth every 8 (eight) hours as needed for nausea or vomiting.  . polyethylene glycol (MIRALAX / GLYCOLAX) packet Take 17 g by mouth daily.  . potassium chloride SA (K-DUR,KLOR-CON) 10 MEQ  tablet Take 1 tablet (10 mEq total) by mouth daily.  . simvastatin (ZOCOR) 40 MG tablet Take 1 tablet (40 mg total) by mouth every evening.    Allergies:   Oxycodone   Social History:  The patient  reports that she has never smoked. She has never used smokeless tobacco. She reports that she does not drink alcohol or use drugs.   Family History:  The patient's family history includes Cancer in her brother, daughter, and father; Diabetes in her brother and sister; Heart attack (age of onset: 39) in her mother; Heart disease (age of onset: 43) in her mother; Hypertension in her sister; Non-Hodgkin's lymphoma in her son.  ROS:   Review of Systems  Constitutional: Positive for malaise/fatigue. Negative for chills, diaphoresis, fever and weight loss.  HENT: Negative for congestion.   Eyes: Negative for discharge and redness.  Respiratory: Positive for shortness of breath. Negative for cough, hemoptysis, sputum production and wheezing.   Cardiovascular: Positive for leg swelling. Negative for chest pain, palpitations, orthopnea, claudication and PND.  Gastrointestinal: Negative for abdominal pain, blood in stool, heartburn, melena, nausea and vomiting.  Genitourinary: Negative for hematuria.  Musculoskeletal: Negative for falls and myalgias.  Skin: Negative for rash.  Neurological: Positive for weakness. Negative for dizziness, tingling, tremors, sensory change, speech change, focal weakness and loss of consciousness.  Endo/Heme/Allergies: Does not bruise/bleed easily.  Psychiatric/Behavioral: Negative for substance abuse. The patient is not nervous/anxious.   All other systems reviewed and are negative.    PHYSICAL EXAM:  VS:  BP 138/82 (BP Location: Right Arm, Patient Position: Sitting, Cuff Size: Normal)   Pulse 77   Ht 5\' 1"  (1.549 m)   Wt 211 lb (95.7 kg)   BMI 39.87 kg/m  BMI: Body mass index is 39.87 kg/m.  Physical Exam  Constitutional: She is oriented to person, place, and  time. She appears well-developed and well-nourished.  HENT:  Head: Normocephalic and atraumatic.  Eyes: Right eye exhibits no discharge. Left eye exhibits no discharge.  Neck: Normal range of motion. No JVD present.  Cardiovascular: Normal rate, regular rhythm, S1 normal, S2 normal and normal heart sounds. Exam reveals no distant heart sounds, no friction rub, no midsystolic click and no opening snap.  No murmur heard. Pulmonary/Chest: Effort normal. No respiratory distress. She has decreased breath sounds. She has no wheezes. She has no rales. She exhibits no tenderness.  Abdominal: Soft. She exhibits no distension. There is no tenderness.  Musculoskeletal: She exhibits edema.  Trace bilateral lower extremity edema to to the mid shins  Neurological: She is alert and oriented to person, place, and time.  Skin: Skin is warm and dry. No cyanosis. Nails show no clubbing.  Psychiatric: She has a normal mood and affect. Her speech is normal and behavior is normal. Judgment and thought content normal.     EKG:  Was ordered and interpreted by me today. Shows NSR, 77 bpm, left axis deviation, LVH, poor R wave progression, lateral TWI (essentially unchanged from prior)  Recent Labs: 07/05/2018: B Natriuretic Peptide 210.0; TSH 0.888 07/30/2018: ALT 11; BUN 14; Creatinine, Ser 0.99; Hemoglobin 10.9; Magnesium 1.6; Platelets 250.0; Potassium 4.9; Sodium 139  04/09/2018: Direct LDL 80.0 07/06/2018: Cholesterol 130; HDL 35; LDL Cholesterol 74; Total CHOL/HDL Ratio 3.7; Triglycerides 107; VLDL 21   Estimated Creatinine Clearance: 45.5 mL/min (by C-G formula based on SCr of 0.99 mg/dL).   Wt Readings from Last 3 Encounters:  08/02/18 211 lb (95.7 kg)  07/30/18 213 lb 6.4 oz (96.8 kg)  07/18/18 211 lb 8 oz (95.9 kg)     Other studies reviewed: Additional studies/records reviewed today include: summarized above  ASSESSMENT AND PLAN:  1. Chronic diastolic CHF: She does not appear grossly volume  overloaded at this time.  Continue Lasix 20 mg daily.  CHF education.  Check BMP.  2. Dyspnea/pulmonary hypertension: Patient daughter would like referral to pulmonology.  Referral placed.  Recent echo showing diastolic dysfunction as above.  Recent Myoview low risk without significant ischemia.  May benefit from PFTs.  3. Hypertension: Blood pressure is reasonably controlled today.  Continue Lasix, losartan, and Toprol-XL.  4. Anemia: Denies any melena or BRBPR.  Check CBC.  Would consider further evaluation with hematology or GI as an outpatient.  This will be deferred to  PCP.  5. Hyperlipidemia: Remains on simvastatin.  6. Lower extremity swelling: Likely some degree of dependent edema.  Recommend she elevate legs.  She refuses to wear compression stockings.  She is agreeable to wear Ace wraps.  Lasix as above.  Disposition: F/u with Dr. Rockey Situ or an APP in 3 months.   Current medicines are reviewed at length with the patient today.  The patient did not have any concerns regarding medicines.  Signed, Christell Faith, PA-C 08/02/2018 10:05 AM     [facility] [address] [place] [address] [place], [place] [address] [phone]

## 2018-08-02 ENCOUNTER — Encounter: Payer: Self-pay | Admitting: Physician Assistant

## 2018-08-02 ENCOUNTER — Ambulatory Visit (INDEPENDENT_AMBULATORY_CARE_PROVIDER_SITE_OTHER): Payer: PPO | Admitting: Physician Assistant

## 2018-08-02 VITALS — BP 138/82 | HR 77 | Ht 61.0 in | Wt 211.0 lb

## 2018-08-02 DIAGNOSIS — I272 Pulmonary hypertension, unspecified: Secondary | ICD-10-CM | POA: Diagnosis not present

## 2018-08-02 DIAGNOSIS — I5032 Chronic diastolic (congestive) heart failure: Secondary | ICD-10-CM

## 2018-08-02 DIAGNOSIS — D649 Anemia, unspecified: Secondary | ICD-10-CM

## 2018-08-02 DIAGNOSIS — R0602 Shortness of breath: Secondary | ICD-10-CM | POA: Diagnosis not present

## 2018-08-02 DIAGNOSIS — E782 Mixed hyperlipidemia: Secondary | ICD-10-CM

## 2018-08-02 DIAGNOSIS — I10 Essential (primary) hypertension: Secondary | ICD-10-CM

## 2018-08-02 DIAGNOSIS — M7989 Other specified soft tissue disorders: Secondary | ICD-10-CM

## 2018-08-02 NOTE — Patient Instructions (Signed)
Medication Instructions:  Your physician recommends that you continue on your current medications as directed. Please refer to the Current Medication list given to you today.  If you need a refill on your cardiac medications before your next appointment, please call your pharmacy.   Lab work: Your physician recommends that you return for lab work in: today (CBC, BMET).   If you have labs (blood work) drawn today and your tests are completely normal, you will receive your results only by: Marland Kitchen MyChart Message (if you have MyChart) OR . A paper copy in the mail If you have any lab test that is abnormal or we need to change your treatment, we will call you to review the results.  Testing/Procedures: None   Follow-Up: At [facility], you and your health needs are our priority.  As part of our continuing mission to provide you with exceptional heart care, we have created designated Provider Care Teams.  These Care Teams include your primary Cardiologist (physician) and Advanced Practice Providers (APPs -  Physician Assistants and Nurse Practitioners) who all work together to provide you with the care you need, when you need it. You will need a follow up appointment in 3 months with Dr. Rockey Situ.   Referral to Pulmonology (COPD).

## 2018-08-03 LAB — CBC
HEMATOCRIT: 35.4 % (ref 34.0–46.6)
HEMOGLOBIN: 11.2 g/dL (ref 11.1–15.9)
MCH: 25 pg — ABNORMAL LOW (ref 26.6–33.0)
MCHC: 31.6 g/dL (ref 31.5–35.7)
MCV: 79 fL (ref 79–97)
Platelets: 255 10*3/uL (ref 150–450)
RBC: 4.48 x10E6/uL (ref 3.77–5.28)
RDW: 21.6 % — AB (ref 12.3–15.4)
WBC: 6.7 10*3/uL (ref 3.4–10.8)

## 2018-08-03 LAB — BASIC METABOLIC PANEL
BUN/Creatinine Ratio: 10 — ABNORMAL LOW (ref 12–28)
BUN: 11 mg/dL (ref 8–27)
CALCIUM: 9.5 mg/dL (ref 8.7–10.3)
CO2: 24 mmol/L (ref 20–29)
CREATININE: 1.06 mg/dL — AB (ref 0.57–1.00)
Chloride: 100 mmol/L (ref 96–106)
GFR calc Af Amer: 56 mL/min/{1.73_m2} — ABNORMAL LOW (ref >59)
GFR, EST NON AFRICAN AMERICAN: 49 mL/min/{1.73_m2} — AB (ref >59)
Glucose: 199 mg/dL — ABNORMAL HIGH (ref 65–99)
POTASSIUM: 4.9 mmol/L (ref 3.5–5.2)
Sodium: 142 mmol/L (ref 134–144)

## 2018-08-05 ENCOUNTER — Ambulatory Visit: Payer: Self-pay | Admitting: *Deleted

## 2018-08-05 NOTE — Telephone Encounter (Signed)
I attempted to contact the patient regarding this.  Please confirm her dose of Lantus.  Please see what her sugars have done since the phone call.  We may need to increase her Lantus though I want to confirm her dosing and blood sugars.

## 2018-08-05 NOTE — Telephone Encounter (Signed)
Please advise 

## 2018-08-05 NOTE — Telephone Encounter (Signed)
Pt calling with concerns of of blood glucose being 189 this morning. Pt states that her glucose is usually no higher than 170 and she checks her glucose once a day in the morning. Pt states her past reading have been 175,179 and 150.Pt deniess any symptoms currently. Pt wanted to make Dr. Caryl Bis aware of glucose readings for his recommendations. Pt states she was seen by Ander Purpura and her Lantus insulin was decreased from 17 units to 12 units and pt states she is currently taking Metformin. Pt asking for a return call with recommendations after 2 pm today due to staying at [facility] and having to go to lunch. Pt can be contacted at [phone]. Reason for Disposition . Blood glucose 70-240 mg/dL (3.9 -13.3 mmol/L)  Answer Assessment - Initial Assessment Questions 1. BLOOD GLUCOSE: "What is your blood glucose level?"      189 2. ONSET: "When did you check the blood glucose?"   This morning 3. USUAL RANGE: "What is your glucose level usually?" (e.g., usual fasting morning value, usual evening value)     Usually no higher than 170 4. KETONES: "Do you check for ketones (urine or blood test strips)?" If yes, ask: "What does the test show now?"      Not assessed 5. TYPE 1 or 2:  "Do you know what type of diabetes you have?"  (e.g., Type 1, Type 2, Gestational; doesn't know)      Type 2 6. INSULIN: "Do you take insulin?" "What type of insulin(s) do you use? What is the mode of delivery? (syringe, pen (e.g., injection or  pump)?"      Pt states she is taken Lantus 12 units currently but was on 17 units 7. DIABETES PILLS: "Do you take any pills for your diabetes?" If yes, ask: "Have you missed taking any pills recently?"     Yes takes Metformin and has not missed any doses recently 8. OTHER SYMPTOMS: "Do you have any symptoms?" (e.g., fever, frequent urination, difficulty breathing, dizziness, weakness, vomiting)     No symptoms currently. difficulty breathing but this is a recurrent issue, pt states  she has a lung disease. No worsening of breathing at this time. 9. PREGNANCY: "Is there any chance you are pregnant?" "When was your last menstrual period?"     n/a  Protocols used: DIABETES - HIGH BLOOD SUGAR-A-AH

## 2018-08-06 ENCOUNTER — Ambulatory Visit (INDEPENDENT_AMBULATORY_CARE_PROVIDER_SITE_OTHER): Payer: PPO | Admitting: Obstetrics and Gynecology

## 2018-08-06 ENCOUNTER — Encounter: Payer: Self-pay | Admitting: Obstetrics and Gynecology

## 2018-08-06 VITALS — BP 155/79 | HR 71

## 2018-08-06 DIAGNOSIS — R971 Elevated cancer antigen 125 [CA 125]: Secondary | ICD-10-CM

## 2018-08-06 DIAGNOSIS — R1900 Intra-abdominal and pelvic swelling, mass and lump, unspecified site: Secondary | ICD-10-CM | POA: Diagnosis not present

## 2018-08-06 NOTE — Progress Notes (Signed)
HPI:      [name] is a 84 y.o. No obstetric history on file. who LMP was No LMP recorded. Patient has had a hysterectomy.  Subjective:   She presents today after being seen in the hospital in consult for a pelvic mass.  A Roma score was obtained at that time.  She presents today for follow-up.  She reports no pelvic pain or symptoms.  She has multiple medical problems and is generally a poor surgical candidate.  She understandably does not want any type of surgery.    Hx: The following portions of the patient's history were reviewed and updated as appropriate:             She  has a past medical history of Benign paroxysmal positional vertigo (07/17/2014), Chronic diastolic CHF (congestive heart failure) ([place]), Diabetes mellitus, H/O: rheumatic fever, Hyperlipidemia, Hypertension, Pulmonary hypertension ([place]), Stroke ([place]), and Thyroid disease. She does not have any pertinent problems on file. She  has a past surgical history that includes Vaginal delivery; Abdominal hysterectomy (1973); Hernia repair (1994); and ORIF ankle fracture (Right, 10/29/2015). Her family history includes Cancer in her brother, daughter, and father; Diabetes in her brother and sister; Heart attack (age of onset: 41) in her mother; Heart disease (age of onset: 13) in her mother; Hypertension in her sister; Non-Hodgkin's lymphoma in her son. She  reports that she has never smoked. She has never used smokeless tobacco. She reports that she does not drink alcohol or use drugs. She has a current medication list which includes the following prescription(s): aspirin ec, furosemide, glucose blood, ibuprofen, insulin glargine, pen needles, levothyroxine, losartan, magnesium oxide, metformin, metoprolol succinate, omeprazole, ondansetron, polyethylene glycol, potassium chloride, and simvastatin. She is allergic to oxycodone.       Review of Systems:  Review of Systems  Constitutional: Denied constitutional symptoms, night  sweats, recent illness, fatigue, fever, insomnia and weight loss.  Eyes: Denied eye symptoms, eye pain, photophobia, vision change and visual disturbance.  Ears/Nose/Throat/Neck: Denied ear, nose, throat or neck symptoms, hearing loss, nasal discharge, sinus congestion and sore throat.  Cardiovascular: Denied cardiovascular symptoms, arrhythmia, chest pain/pressure, edema, exercise intolerance, orthopnea and palpitations.  Respiratory: Denied pulmonary symptoms, asthma, pleuritic pain, productive sputum, cough, dyspnea and wheezing.  Gastrointestinal: Denied, gastro-esophageal reflux, melena, nausea and vomiting.  Genitourinary: See HPI for additional information.  Musculoskeletal: Denied musculoskeletal symptoms, stiffness, swelling, muscle weakness and myalgia.  Dermatologic: Denied dermatology symptoms, rash and scar.  Neurologic: Denied neurology symptoms, dizziness, headache, neck pain and syncope.  Psychiatric: Denied psychiatric symptoms, anxiety and depression.  Endocrine: Denied endocrine symptoms including hot flashes and night sweats.   Meds:   Current Outpatient Medications on File Prior to Visit  Medication Sig Dispense Refill  . aspirin EC 81 MG tablet Take 81 mg by mouth daily.    . furosemide (LASIX) 20 MG tablet Take 1 tablet (20 mg total) by mouth daily. 30 tablet 2  . glucose blood (ONE TOUCH ULTRA TEST) test strip Use as instructed to check Blood sugar three times a day.  E11.65 Dispense patient choice. 300 each 3  . ibuprofen (ADVIL,MOTRIN) 200 MG tablet Take 200 mg by mouth every 6 (six) hours as needed.    . Insulin Glargine (LANTUS SOLOSTAR) 100 UNIT/ML Solostar Pen INJECT  12 UNITS SUBCUTANEOUSLY IN THE MORNING 15 mL 2  . Insulin Pen Needle (PEN NEEDLES) 32G X 4 MM MISC Inject 1 Dose into the skin daily. 90 each 3  .  levothyroxine (SYNTHROID, LEVOTHROID) 75 MCG tablet Take 1 tablet (75 mcg total) by mouth daily. 90 tablet 3  . losartan (COZAAR) 50 MG tablet Take 50 mg  by mouth daily.    . magnesium oxide (MAG-OX) 400 MG tablet Take 400 mg by mouth daily.    . metFORMIN (GLUCOPHAGE) 1000 MG tablet Take 1 tablet (1,000 mg total) by mouth 2 (two) times daily with a meal. TAKE 1 TABLET TWICE DAILY  WITH  A  MEAL 180 tablet 3  . metoprolol succinate (TOPROL-XL) 25 MG 24 hr tablet Take 1 tablet (25 mg total) by mouth daily. 30 tablet 2  . omeprazole (PRILOSEC) 20 MG capsule Take 1 capsule (20 mg total) by mouth daily. 90 capsule 3  . ondansetron (ZOFRAN ODT) 4 MG disintegrating tablet Take 1 tablet (4 mg total) by mouth every 8 (eight) hours as needed for nausea or vomiting. 20 tablet 0  . polyethylene glycol (MIRALAX / GLYCOLAX) packet Take 17 g by mouth daily.    . potassium chloride SA (K-DUR,KLOR-CON) 10 MEQ tablet Take 1 tablet (10 mEq total) by mouth daily. 2 tablet 0  . simvastatin (ZOCOR) 40 MG tablet Take 1 tablet (40 mg total) by mouth every evening. 90 tablet 2   No current facility-administered medications on file prior to visit.     Objective:     Vitals:   08/06/18 0925  BP: (!) 155/79  Pulse: 71              CT scan reveals a pelvic mass likely ovarian with the suggestion that it could possibly be an ovarian teratoma. Roma score elevated.  Assessment:    No obstetric history on file. Patient Active Problem List   Diagnosis Date Noted  . AKI (acute kidney injury) ([place])   . Acute on chronic diastolic heart failure ([place])   . Respiratory failure ([place]) 07/05/2018  . Paroxysmal tachycardia ([place]) 11/16/2017  . Coronary artery calcification seen on CAT scan 11/16/2017  . Morbid obesity ([place]) 10/30/2017  . (HFpEF) heart failure with preserved ejection fraction ([place]) 09/28/2017  . Family history of colon cancer 04/17/2017  . Closed right ankle fracture 10/28/2015  . Fatigue 10/18/2015  . TIA (transient ischemic attack) 09/18/2015  . Orthostatic hypotension 08/02/2015  . Right shoulder injury 08/02/2015  . Adjustment disorder with anxious  mood 03/30/2015  . Postmenopausal estrogen deficiency 06/03/2014  . DNR (do not resuscitate) discussion 03/04/2014  . Gait disturbance 05/07/2013  . Diabetes ([place]) 04/21/2013  . Shortness of breath 12/09/2012  . Hearing loss 12/09/2012  . Anxiety 01/31/2012  . Hypertension 12/22/2011  . Hyperlipidemia 12/22/2011  . Hypothyroidism 12/22/2011     1. Pelvic mass in female   2. 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  Patient a poor surgical candidate.   Plan:            1.  I have discussed her Roma score and CT findings in detail possible future work-up possible surgery possible expectant management discussed.  I have referred her to GYN oncology for a second opinion and possible management as needed.  All questions answered. Orders No orders of the defined types were placed in this encounter.   No orders of the defined types were placed in this encounter.     F/U  No follow-ups on file. I spent 27 minutes involved in the care of this patient of which greater than  50% was spent discussing CT findings, teratoma versus cancer, possible work-up, possible surgical intervention, referral to GYN oncology and the necessity.  Roma score, possible use of ultrasound for diagnosis etc.  All questions answered.  Finis Bud, M.D. 08/06/2018 11:49 AM

## 2018-08-06 NOTE — Telephone Encounter (Signed)
LMTCB

## 2018-08-06 NOTE — Telephone Encounter (Signed)
Patient stated that last two morning blood sugars have been in the 180's. This morning she took 15 units instead of the 12 units of lantus she has been taking. She had not checked her BS since this morning. Please advise?

## 2018-08-06 NOTE — Telephone Encounter (Signed)
Pt notified & will contact us on Friday with blood sugar readings.

## 2018-08-06 NOTE — Progress Notes (Signed)
Pt is not quite sure exactly why she is here. She states she was informed has an ovarian mass on the left side. Pt denies any pain.

## 2018-08-06 NOTE — Telephone Encounter (Signed)
Noted.  I would suggest only increasing to 13 units of Lantus daily.  She should monitor her blood sugars for the next 3 to 4 days fasting in the morning.  She should contact us on Friday or Monday to let us know what her blood sugars have been doing with 13 units of Lantus daily.  If we increase the Lantus too quickly she runs the risk of dropping a low which is dangerous.

## 2018-08-07 ENCOUNTER — Other Ambulatory Visit: Payer: Self-pay

## 2018-08-07 ENCOUNTER — Inpatient Hospital Stay: Payer: PPO | Attending: Obstetrics and Gynecology | Admitting: Obstetrics and Gynecology

## 2018-08-07 ENCOUNTER — Inpatient Hospital Stay: Payer: PPO

## 2018-08-07 VITALS — BP 136/80 | HR 67 | Temp 97.5°F | Resp 18 | Wt 199.2 lb

## 2018-08-07 DIAGNOSIS — D3912 Neoplasm of uncertain behavior of left ovary: Secondary | ICD-10-CM

## 2018-08-07 DIAGNOSIS — Z90710 Acquired absence of both cervix and uterus: Secondary | ICD-10-CM | POA: Diagnosis not present

## 2018-08-07 DIAGNOSIS — D271 Benign neoplasm of left ovary: Secondary | ICD-10-CM | POA: Insufficient documentation

## 2018-08-07 NOTE — Progress Notes (Signed)
New patient referral from Dr. Amalia Hailey for pelvic mass/elevated ROMA. Pt denies any adverse GYN symptoms.

## 2018-08-07 NOTE — Patient Instructions (Signed)
TERATOMAS Teratomas are the most common type of germ cell tumor. Most, but not all, teratomas are benign.   Teratomas are divided into four categories: mature (cystic or solid, benign), immature (malignant), malignant due to a component of another somatic malignant neoplasm, and monodermal or highly specialized. Mature teratoma (dermoid) - Most teratomas are cystic and composed of mature differentiated elements (mature); they are better known as dermoid cysts. The mature cystic teratoma accounts for more than 95 percent of all ovarian teratomas and is almost invariably benign. Dermoid cysts are the most common ovarian tumor in women in the second and third decade of life.  In rare instances, a teratoma is solid but is composed entirely of benign-appearing heterogeneous collections of tissue and organized structures derived from all three cell layers. Most mature solid teratomas are unilateral and benign, although peritoneal implants have been described. Grossly, it may be difficult/impossible to differentiate these neoplasms from malignant solid immature teratomas, which are almost always solid, and they therefore may require sampling from multiple sites (see 'Immature teratoma' below). Management is as described above for mature cystic teratomas.  Histopathology - Mature cystic teratomas contain mature tissue of ectodermal (eg, skin, hair follicles, sebaceous glands), mesodermal (eg, muscle, urinary), and endodermal origin (eg, lung, gastrointestinal). The mechanism by which these cysts develop is possibly by failure of meiosis II or from a premeiotic cell in which meiosis I has failed. They are bilateral in 10 to 17 percent of cases.  The characteristic macroscopic appearance of benign cystic teratomas is a multicystic mass that contains hair, teeth, and/or skin that is mixed into sebaceous, thick, sticky, and often foul-smelling material. A solid prominence (Rokitansky protuberance) is located at the  junction between the teratoma and normal ovarian tissue. The greatest cellular variety is found in the area of this junction, which should therefore be examined carefully by the pathologist to exclude immature/malignant components.  Clinical manifestations - Most women with dermoid cysts are asymptomatic. If present, symptoms depend upon the size of the mass. Torsion is not uncommon. Rupture of dermoid cysts with spillage of sebaceous material into the abdominal cavity can occur, but is uncommon. Shock and hemorrhage are the immediate sequelae of rupture; a marked granulomatous reaction (chemical peritonitis) may subsequently develop and lead to formation of dense adhesions. A rare condition associated with either mature or immature teratomas is Anti-N-methyl-D-aspartate (NMDA) receptor encephalitis.  Diagnosis - These tumors have a characteristic ultrasound appearance, which allows reasonably accurate noninvasive diagnosis in many cases. The reported specificity is 98 to 100 percent. Definitive diagnosis is made at the time of surgical excision.   Treatment - Ovarian cystectomy is suggested in order to make a definitive diagnosis, preserve ovarian tissue, and avoid potential problems such as torsion, rupture, or development of malignant components. For women who have completed childbearing, salpingo-oophorectomy is also acceptable treatment. Benign cystic teratomas do not recur if surgically resected.  Dermoid cysts may be removed via either laparoscopy or laparotomy. With either approach, the abdomen should be copiously irrigated to avoid a chemical peritonitis from spillage of the sebaceous cyst fluid.   Malignant transformation - Malignant transformation occurs in 0.2 to 2 percent of mature cystic teratomas. Mature teratomas with malignant transformation comprise 2.9 percent of all malignant OGCTs. Although any of the components of a mature cystic teratoma may undergo malignant degeneration, squamous  cell carcinoma arising from the ectoderm is the most common secondary neoplasm.  Risk factors for malignant neoplasm in a mature cystic teratoma include age over 78 years (  mean age, 61 years versus 33 years for benign teratomas), tumor diameter greater than 10 cm, rapid growth, and findings on imaging (eg, low-resistance intratumor flow on Doppler).  Other possible malignant neoplasms include (but are not limited to) basal cell carcinoma, melanoma, adenocarcinoma, sarcoma, and thyroid carcinoma. When malignant transformation has occurred within a teratoma, treatment must be tailored to the transformed histology.

## 2018-08-07 NOTE — Progress Notes (Signed)
Gynecologic Oncology Consult Visit   Referring Provider: Harlin Heys, MD   Chief Concern: Ovarian mass   Subjective:  Diane Mullins is a 84 y.o. female s/p TAH (benign disease, ovaries in situ) who is seen in consultation from Dr. Amalia Hailey for second opinion and possible management as needed for an ovarian mass c/w teratoma in the setting of multiple medical comorbidities.    The patient has a history of HFpEF/pulmonary hypertension, DM2, HTN, HLD, prior stroke and vertigo who presents for hospital follow up after recent admission to [facility] from 10/18 to 10/22 for acute respiratory distress in the setting of acute on chronic diastolic CHF, worsening anemia, diarrhea, and hypoalbuminemia.  07/05/2018 CT scan obtained fpr acuite generalized abdominal pain and revealed.   Reproductive: Status post hysterectomy. 7.4 cm fat containing mass is noted in left adnexal region consistent with dermoid tumor.  IMPRESSION: Large sliding-type hiatal hernia.  7.4 cm fat containing mass seen in left adnexal region consistent with left ovarian dermoid tumor. Sigmoid diverticulosis without inflammation. Aortic Atherosclerosis (ICD10-I70.0).  CA125 54.3 HE4 128 ROMA score 4.7 elevated  Dr. Amalia Hailey recommended expectant management given her medical issues.   Today she reports that she is living at [facility], an independent retirement community. She is off oxygen and her breathing has improved since discharge. EF of 60% w/ grade 2 diastolic dysfunction.  She is independent of her ADLs. She had hysterectomy and removal of right ovary in her 30s for abnormal uterine bleeding. She complains of urinary incontinence for past 2-3 years. Blood sugars have been elevated at home recently which she believes is due to dose decrease in medication while hospitalized. Last hA1c 8.1 (06/2018).  She denies pelvic pain or pressure. Denies post-menopausal bleeding. Has had 12lb weight loss in past month which she has  accounted to 'water pills'.    Problem List: Patient Active Problem List   Diagnosis Date Noted  . 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 AKI (acute kidney injury) ([place])   . Acute on chronic diastolic heart failure ([place])   . Respiratory failure ([place]) 07/05/2018  . Paroxysmal tachycardia ([place]) 11/16/2017  . Coronary artery calcification seen on CAT scan 11/16/2017  . Morbid obesity ([place]) 10/30/2017  . (HFpEF) heart failure with preserved ejection fraction ([place]) 09/28/2017  . Family history of colon cancer 04/17/2017  . Closed right ankle fracture 10/28/2015  . Fatigue 10/18/2015  . TIA (transient ischemic attack) 09/18/2015  . Orthostatic hypotension 08/02/2015  . Adjustment disorder with anxious mood 03/30/2015  . Postmenopausal estrogen deficiency 06/03/2014  . DNR (do not resuscitate) discussion 03/04/2014  . Gait disturbance 05/07/2013  . Diabetes ([place]) 04/21/2013  . Shortness of breath 12/09/2012  . Hearing loss 12/09/2012  . Anxiety 01/31/2012  . Hypertension 12/22/2011  . Hyperlipidemia 12/22/2011  . Hypothyroidism 12/22/2011    Past Medical History: Past Medical History:  Diagnosis Date  . Benign paroxysmal positional vertigo 07/17/2014  . Chronic diastolic CHF (congestive heart failure) ([place])    a. echo 10/19: EF of 60-65%, no RWMA, Gr2DD, calcified mitral annulus with mild MR, mildly to moderately dilated left atrium, moderate TR, PASP 52 mmHg  . Diabetes mellitus   . H/O: rheumatic fever   . Hyperlipidemia   . Hypertension   . Pulmonary hypertension ([place])   . Stroke [facility])    TIA Jan. 1st  . Thyroid disease     Past Surgical History: Past Surgical History:  Procedure Laterality Date  . ABDOMINAL HYSTERECTOMY  1973  menorrhagia  . HERNIA REPAIR  1994  . ORIF ANKLE FRACTURE Right 10/29/2015   Procedure: OPEN REDUCTION INTERNAL FIXATION (ORIF) ANKLE FRACTURE;  Surgeon: Thornton Park, MD;  Location: [facility] ORS;  Service: Orthopedics;  Laterality: Right;  Marland Kitchen  VAGINAL DELIVERY     x4    Past Gynecologic History:  Hysterectomy and unilateral ovary removal in her 69s for abnormal bleeding.  Denies abnormal pap smears, hx of STDs.    OB History: Multiparous SVDs OB History  No data available    Family History: Family History  Problem Relation Age of Onset  . Heart disease Mother 82  . Heart attack Mother 25  . Cancer Father        colon  . Diabetes Sister   . Hypertension Sister   . Cancer Brother        kidney  . Diabetes Brother   . Cancer Daughter        ovarian  . Non-Hodgkin's lymphoma Son     Social History: Social History   Socioeconomic History  . Marital status: Widowed    Spouse name: Not on file  . Number of children: 4  . Years of education: Not on file  . Highest education level: Not on file  Occupational History  . Not on file  Social Needs  . Financial resource strain: Not hard at all  . Food insecurity:    Worry: Never true    Inability: Never true  . Transportation needs:    Medical: No    Non-medical: No  Tobacco Use  . Smoking status: Never Smoker  . Smokeless tobacco: Never Used  Substance and Sexual Activity  . Alcohol use: No  . Drug use: No  . Sexual activity: Never  Lifestyle  . Physical activity:    Days per week: 3 days    Minutes per session: 50 min  . Stress: Not at all  Relationships  . Social connections:    Talks on phone: Not on file    Gets together: Not on file    Attends religious service: Not on file    Active member of club or organization: Not on file    Attends meetings of clubs or organizations: Not on file    Relationship status: Not on file  . Intimate partner violence:    Fear of current or ex partner: Not on file    Emotionally abused: Not on file    Physically abused: Not on file    Forced sexual activity: Not on file  Other Topics Concern  . Not on file  Social History Narrative   Lives in [place] alone. No pets. Retired from [employer].   Diet: healthy    Exercise: water aerobics twice weekly, balance class   Ambulates with a walker at baseline    Allergies: Allergies  Allergen Reactions  . Oxycodone     Other reaction(s): Confusion Patient's daughter reported confusion/sensitivity to oxycodone.     Current Medications: Current Outpatient Medications  Medication Sig Dispense Refill  . aspirin EC 81 MG tablet Take 81 mg by mouth daily.    . furosemide (LASIX) 20 MG tablet Take 1 tablet (20 mg total) by mouth daily. 30 tablet 2  . glucose blood (ONE TOUCH ULTRA TEST) test strip Use as instructed to check Blood sugar three times a day.  E11.65 Dispense patient choice. 300 each 3  . ibuprofen (ADVIL,MOTRIN) 200 MG tablet Take 200 mg by mouth every 6 (six) hours as needed.    Marland Kitchen  Insulin Glargine (LANTUS SOLOSTAR) 100 UNIT/ML Solostar Pen INJECT  12 UNITS SUBCUTANEOUSLY IN THE MORNING (Patient taking differently: INJECT  13 UNITS SUBCUTANEOUSLY IN THE MORNING) 15 mL 2  . Insulin Pen Needle (PEN NEEDLES) 32G X 4 MM MISC Inject 1 Dose into the skin daily. 90 each 3  . levothyroxine (SYNTHROID, LEVOTHROID) 75 MCG tablet Take 1 tablet (75 mcg total) by mouth daily. 90 tablet 3  . losartan (COZAAR) 50 MG tablet Take 50 mg by mouth daily.    . magnesium oxide (MAG-OX) 400 MG tablet Take 400 mg by mouth daily.    . metFORMIN (GLUCOPHAGE) 1000 MG tablet Take 1 tablet (1,000 mg total) by mouth 2 (two) times daily with a meal. TAKE 1 TABLET TWICE DAILY  WITH  A  MEAL 180 tablet 3  . metoprolol succinate (TOPROL-XL) 25 MG 24 hr tablet Take 1 tablet (25 mg total) by mouth daily. 30 tablet 2  . omeprazole (PRILOSEC) 20 MG capsule Take 1 capsule (20 mg total) by mouth daily. 90 capsule 3  . polyethylene glycol (MIRALAX / GLYCOLAX) packet Take 17 g by mouth daily.    . potassium chloride SA (K-DUR,KLOR-CON) 10 MEQ tablet Take 1 tablet (10 mEq total) by mouth daily. 2 tablet 0  . simvastatin (ZOCOR) 40 MG tablet Take 1 tablet (40 mg total) by mouth every  evening. 90 tablet 2  . ondansetron (ZOFRAN ODT) 4 MG disintegrating tablet Take 1 tablet (4 mg total) by mouth every 8 (eight) hours as needed for nausea or vomiting. (Patient not taking: Reported on 08/07/2018) 20 tablet 0   No current facility-administered medications for this visit.     Review of Systems General: negative for fevers, chills, fatigue, changes in sleep, changes in weight or appetite Skin: negative for changes in color, texture, moles or lesions Eyes: negative for changes in vision, pain, diplopia HEENT: negative for change in hearing, pain, discharge, tinnitus, vertigo, voice changes, sore throat, neck masses Breasts: negative for breast lumps Pulmonary: negative for dyspnea, orthopnea, productive cough Cardiac: negative for palpitations, syncope, pain, discomfort, pressure Gastrointestinal: negative for dysphagia, nausea, vomiting, jaundice, pain, constipation, diarrhea, hematemesis, hematochezia Genitourinary/Sexual: positive for urinary incontinence. negative for dysuria, discharge, hesitancy, nocturia, retention, stones, infections, STD's Ob/Gyn: negative for irregular bleeding, pain Musculoskeletal: negative for pain, stiffness, swelling, range of motion limitation Hematology: negative for easy bruising, bleeding Neurologic/Psych: negative for headaches, seizures, paralysis, weakness, tremor, change in gait, change in sensation, mood swings, depression, anxiety, change in memory   Objective:  Physical Examination:  BP 136/80 (BP Location: Left Arm, Patient Position: Sitting)   Pulse 67   Temp (!) 97.5 F (36.4 C) (Oral)   Resp 18   Wt 199 lb 3 oz (90.4 kg)   SpO2 94%   BMI 37.64 kg/m    ECOG Performance Status: 2 - Symptomatic, <50% confined to bed  GENERAL:Elderly female, unaccompanied. No acute distress.  HEENT:  Sclerae anicteric.  Oropharynx clear and moist. No ulcerations or evidence of oropharyngeal candidiasis. Neck is supple.  NODES:  No cervical,  supraclavicular, or axillary lymphadenopathy palpated.  LUNGS:  Clear to auscultation bilaterally but decreased breath sounds bilaterally HEART:  Regular rate and rhythm.  ABDOMEN:  Soft, nontender.  No hepatosplenomegaly palpated. No ascites or masses MSK:  No focal spinal tenderness to palpation. Full range of motion bilaterally in the upper extremities. 4 wheel rolling walker.  EXTREMITIES:  No peripheral edema.   SKIN:  Clear with no obvious rashes or skin changes.  No nail dyscrasia. NEURO:  Nonfocal. Well oriented.  Appropriate affect.   Pelvic: Exam Chaperoned by NP EGBUS: no lesions Cervix: absent Vagina: no lesions, no discharge or bleeding; vaginal cuff well healed Uterus: absent Adnexa: no palpable masses Rectovaginal: deferred; large stool burden   CT scan 07/05/2018      Assessment:  CHAYLEE EHRSAM is a 84 y.o. female diagnosed with possible ovarian teratoma with elevated CA125 and HE4 which may be falsely elevated given CHF and AKI. The calculation demonstrates high risk of malignancy of 47% but may not be reliable in her situation. She is a high risk surgical candidate.    Medical co-morbidities complicating care: chronic diastolic heart failure (HFpEF) heart failure with preserved ejection fraction); h/o AKI, respiratory failure history, CAD, morbid obesity, TIA (transient ischemic attack), diabetes, HTN, hyperlipidemia, hypothyroidism, limited ambulation (uses a walker).   Plan:   Problem List Items Addressed This Visit      Endocrine   Teratoma of left ovary - Primary   Relevant Orders   CA 125   Human Epididymis Protein 4   US PELVIC COMPLETE WITH TRANSVAGINAL      We discussed options for management including observation given her high risk surgical status versus surgery. We also discussed rationale about why I would not recommended CT guided biopsy. Teratomas are associated with chemical peritonitis and I am concerned about leakage after a biopsy. We  reviewed that the majority of teratomas are benign but malignancy can be present on rare occasion. Her risk of malignancy may be higher given older age. ROMA scoring was 47% based on my calculation but again that may not be accurate. Her daughter, Diane Mullins, was involved in the conversation via telephone. The patient will discuss further with her family and let us know how she would like to proceed. If observation then plan for follow up Jan 2020 with repeat pelvic US, CA125 and HE4. If surgery then I recommended POSH assessment and surgery at [facility] given her high risk for perioperative complications.    Suggested return to clinic in  ~ 2 months.    The patient's diagnosis, an outline of the further diagnostic and laboratory studies which will be required, the recommendation, and alternatives were discussed.  All questions were answered to the patient's satisfaction.  A total of  80 minutes were spent with the patient/family today; 50% was spent in education, counseling and coordination of care for pelvic mass.    Gillis Ends, MD    CC:  Harlin Heys, MD

## 2018-08-09 ENCOUNTER — Telehealth: Payer: Self-pay

## 2018-08-09 NOTE — Telephone Encounter (Signed)
Pt notified & will call back with readings Tuesday.

## 2018-08-09 NOTE — Telephone Encounter (Signed)
Copied from [place] [id number] [id number]. Topic: General - Other >> Aug 09, 2018 10:31 AM Rutherford Nail, NT wrote: Reason for CRM: Patient calling with blood sugar results from this week.  11/18 Monday 179  11/19 Tuesday 186 11/20 Wednesday 187 11/21 Thursday 177 11/22 Friday 157  13 units taken 1x per day CB#: [phone]

## 2018-08-09 NOTE — Telephone Encounter (Signed)
Blood sugars reviewed.  Patient can increase her Lantus to 14 units daily.  She should contact us next Tuesday with her readings.

## 2018-08-09 NOTE — Telephone Encounter (Signed)
Sent to PCP to review

## 2018-08-12 ENCOUNTER — Ambulatory Visit: Payer: Self-pay | Admitting: *Deleted

## 2018-08-12 NOTE — Telephone Encounter (Signed)
Sent to PCP please clarify what dose patient should be on for her metoprolol. Last went to the hospital 07/05/2018.   Pt stated that she was on metoprolol 100 MG after she left the hospital they decreased her metoprolol to 25 MG and patient doesn't understand why.   Hospital notes stated this as to why the change was made "HTN-with the IV diuresis, her blood pressures have been low.  So losartan discontinued.  Metoprolol dose has been decreased.  Continue to monitor as outpatient and restart meds if needed."  Would you like her to continue the 25 MG dose?

## 2018-08-12 NOTE — Telephone Encounter (Signed)
Pt called and stated that she was seen int he hospital 07/05/18. Pt states that hospital changed medication dosage and would like to talk to a nurse about medication. Please advise   Reason for Disposition . Caller has URGENT medication question about med that PCP prescribed and triager unable to answer question    Patient needs to know which dosage of BP medication to take- they changed it when she was at the hospital.  Answer Assessment - Initial Assessment Questions 1. SYMPTOMS: "Do you have any symptoms?"     No.  Medication question only: Patient is concerned about the change in the dosage of her metoprolol. Patient states she was on 100 mg before she went into the hospital- and now they have decreased it to 25 mg. She was not notified of why it was decreased and is not sure now what she should be taking. She wants Dr Caryl Bis to review and tell her what she should be taking.  Protocols used: MEDICATION QUESTION CALL-A-AH  Please call patient and let her know about the dosage of her Metoprolol.

## 2018-08-12 NOTE — Telephone Encounter (Signed)
The patient should be taking the 25 mg as her blood pressure had been low in the hospital.  Please see if she has been checking her blood pressure at home.  It appears to been relatively well controlled on a number of her visits since discharge.  Please also confirm if she is taking Lasix and losartan.  Thanks.

## 2018-08-12 NOTE — Telephone Encounter (Signed)
It appears that at discharge they changed patient dose to 25 mg. Patient wants to see if she could go back on 100 mg of metoprolol? Please advise?

## 2018-08-13 DIAGNOSIS — Z8673 Personal history of transient ischemic attack (TIA), and cerebral infarction without residual deficits: Secondary | ICD-10-CM | POA: Diagnosis not present

## 2018-08-13 DIAGNOSIS — I130 Hypertensive heart and chronic kidney disease with heart failure and stage 1 through stage 4 chronic kidney disease, or unspecified chronic kidney disease: Secondary | ICD-10-CM | POA: Diagnosis not present

## 2018-08-13 DIAGNOSIS — D271 Benign neoplasm of left ovary: Secondary | ICD-10-CM | POA: Diagnosis not present

## 2018-08-13 DIAGNOSIS — I2510 Atherosclerotic heart disease of native coronary artery without angina pectoris: Secondary | ICD-10-CM | POA: Diagnosis not present

## 2018-08-13 DIAGNOSIS — K529 Noninfective gastroenteritis and colitis, unspecified: Secondary | ICD-10-CM | POA: Diagnosis not present

## 2018-08-13 DIAGNOSIS — I272 Pulmonary hypertension, unspecified: Secondary | ICD-10-CM | POA: Diagnosis not present

## 2018-08-13 DIAGNOSIS — M1991 Primary osteoarthritis, unspecified site: Secondary | ICD-10-CM | POA: Diagnosis not present

## 2018-08-13 DIAGNOSIS — D500 Iron deficiency anemia secondary to blood loss (chronic): Secondary | ICD-10-CM | POA: Diagnosis not present

## 2018-08-13 DIAGNOSIS — Z794 Long term (current) use of insulin: Secondary | ICD-10-CM | POA: Diagnosis not present

## 2018-08-13 DIAGNOSIS — F4322 Adjustment disorder with anxiety: Secondary | ICD-10-CM | POA: Diagnosis not present

## 2018-08-13 DIAGNOSIS — N183 Chronic kidney disease, stage 3 (moderate): Secondary | ICD-10-CM | POA: Diagnosis not present

## 2018-08-13 DIAGNOSIS — Z6839 Body mass index (BMI) 39.0-39.9, adult: Secondary | ICD-10-CM | POA: Diagnosis not present

## 2018-08-13 DIAGNOSIS — I5033 Acute on chronic diastolic (congestive) heart failure: Secondary | ICD-10-CM | POA: Diagnosis not present

## 2018-08-13 DIAGNOSIS — K5790 Diverticulosis of intestine, part unspecified, without perforation or abscess without bleeding: Secondary | ICD-10-CM | POA: Diagnosis not present

## 2018-08-13 DIAGNOSIS — Z9181 History of falling: Secondary | ICD-10-CM | POA: Diagnosis not present

## 2018-08-13 DIAGNOSIS — H8110 Benign paroxysmal vertigo, unspecified ear: Secondary | ICD-10-CM | POA: Diagnosis not present

## 2018-08-13 DIAGNOSIS — E039 Hypothyroidism, unspecified: Secondary | ICD-10-CM | POA: Diagnosis not present

## 2018-08-13 DIAGNOSIS — E1122 Type 2 diabetes mellitus with diabetic chronic kidney disease: Secondary | ICD-10-CM | POA: Diagnosis not present

## 2018-08-13 DIAGNOSIS — Z7982 Long term (current) use of aspirin: Secondary | ICD-10-CM | POA: Diagnosis not present

## 2018-08-13 DIAGNOSIS — E785 Hyperlipidemia, unspecified: Secondary | ICD-10-CM | POA: Diagnosis not present

## 2018-08-13 NOTE — Telephone Encounter (Signed)
Called and spoke with patient. Pt advised to continue the metoprolol 25 MG and advised her that this change was made due to her BP running so low. Pt voiced understanding. Pt stated that she has been checking her BP at home and it has been looking good pt stated. Plus she is taking the lasix and the losartan. However, she is confused because at the hospital the put her on potassium as well pt wanted to know does she need to continue this or not since she has no more refills.   Please advise thanks

## 2018-08-16 MED ORDER — POTASSIUM CHLORIDE CRYS ER 10 MEQ PO TBCR: 10.0000 meq | EXTENDED_RELEASE_TABLET | Freq: Every day | ORAL | 1 refills | Status: DC

## 2018-08-16 NOTE — Telephone Encounter (Signed)
Noted.  It appears she was to be on the potassium given that she has been on Lasix.  Please see if she has been taking the potassium daily and when she ran out.  I did send a refill in.

## 2018-08-16 NOTE — Addendum Note (Signed)
Addended by: [name] on: 08/16/2018 10:13 AM   Modules accepted: Orders

## 2018-08-19 DIAGNOSIS — I272 Pulmonary hypertension, unspecified: Secondary | ICD-10-CM | POA: Diagnosis not present

## 2018-08-19 DIAGNOSIS — K529 Noninfective gastroenteritis and colitis, unspecified: Secondary | ICD-10-CM | POA: Diagnosis not present

## 2018-08-19 DIAGNOSIS — Z6839 Body mass index (BMI) 39.0-39.9, adult: Secondary | ICD-10-CM | POA: Diagnosis not present

## 2018-08-19 DIAGNOSIS — H8110 Benign paroxysmal vertigo, unspecified ear: Secondary | ICD-10-CM | POA: Diagnosis not present

## 2018-08-19 DIAGNOSIS — I130 Hypertensive heart and chronic kidney disease with heart failure and stage 1 through stage 4 chronic kidney disease, or unspecified chronic kidney disease: Secondary | ICD-10-CM | POA: Diagnosis not present

## 2018-08-19 DIAGNOSIS — E039 Hypothyroidism, unspecified: Secondary | ICD-10-CM | POA: Diagnosis not present

## 2018-08-19 DIAGNOSIS — F4322 Adjustment disorder with anxiety: Secondary | ICD-10-CM | POA: Diagnosis not present

## 2018-08-19 DIAGNOSIS — D500 Iron deficiency anemia secondary to blood loss (chronic): Secondary | ICD-10-CM | POA: Diagnosis not present

## 2018-08-19 DIAGNOSIS — K5790 Diverticulosis of intestine, part unspecified, without perforation or abscess without bleeding: Secondary | ICD-10-CM | POA: Diagnosis not present

## 2018-08-19 DIAGNOSIS — D271 Benign neoplasm of left ovary: Secondary | ICD-10-CM | POA: Diagnosis not present

## 2018-08-19 DIAGNOSIS — I2510 Atherosclerotic heart disease of native coronary artery without angina pectoris: Secondary | ICD-10-CM | POA: Diagnosis not present

## 2018-08-19 DIAGNOSIS — Z8673 Personal history of transient ischemic attack (TIA), and cerebral infarction without residual deficits: Secondary | ICD-10-CM | POA: Diagnosis not present

## 2018-08-19 DIAGNOSIS — I5033 Acute on chronic diastolic (congestive) heart failure: Secondary | ICD-10-CM | POA: Diagnosis not present

## 2018-08-19 DIAGNOSIS — E1122 Type 2 diabetes mellitus with diabetic chronic kidney disease: Secondary | ICD-10-CM | POA: Diagnosis not present

## 2018-08-19 DIAGNOSIS — Z794 Long term (current) use of insulin: Secondary | ICD-10-CM | POA: Diagnosis not present

## 2018-08-19 DIAGNOSIS — N183 Chronic kidney disease, stage 3 (moderate): Secondary | ICD-10-CM | POA: Diagnosis not present

## 2018-08-19 DIAGNOSIS — E785 Hyperlipidemia, unspecified: Secondary | ICD-10-CM | POA: Diagnosis not present

## 2018-08-19 DIAGNOSIS — M1991 Primary osteoarthritis, unspecified site: Secondary | ICD-10-CM | POA: Diagnosis not present

## 2018-08-19 DIAGNOSIS — Z9181 History of falling: Secondary | ICD-10-CM | POA: Diagnosis not present

## 2018-08-19 DIAGNOSIS — Z7982 Long term (current) use of aspirin: Secondary | ICD-10-CM | POA: Diagnosis not present

## 2018-08-19 NOTE — Telephone Encounter (Signed)
Called patient and left a VM to call back. CRM created and sent to [facility] pool.  

## 2018-08-20 NOTE — Telephone Encounter (Signed)
Called patient and left a VM to call back.  

## 2018-08-21 ENCOUNTER — Ambulatory Visit: Payer: Self-pay

## 2018-08-21 ENCOUNTER — Telehealth: Payer: Self-pay | Admitting: Family Medicine

## 2018-08-21 DIAGNOSIS — K529 Noninfective gastroenteritis and colitis, unspecified: Secondary | ICD-10-CM | POA: Diagnosis not present

## 2018-08-21 DIAGNOSIS — Z8673 Personal history of transient ischemic attack (TIA), and cerebral infarction without residual deficits: Secondary | ICD-10-CM | POA: Diagnosis not present

## 2018-08-21 DIAGNOSIS — Z794 Long term (current) use of insulin: Secondary | ICD-10-CM | POA: Diagnosis not present

## 2018-08-21 DIAGNOSIS — D271 Benign neoplasm of left ovary: Secondary | ICD-10-CM | POA: Diagnosis not present

## 2018-08-21 DIAGNOSIS — H8110 Benign paroxysmal vertigo, unspecified ear: Secondary | ICD-10-CM | POA: Diagnosis not present

## 2018-08-21 DIAGNOSIS — I272 Pulmonary hypertension, unspecified: Secondary | ICD-10-CM | POA: Diagnosis not present

## 2018-08-21 DIAGNOSIS — Z9181 History of falling: Secondary | ICD-10-CM | POA: Diagnosis not present

## 2018-08-21 DIAGNOSIS — Z6839 Body mass index (BMI) 39.0-39.9, adult: Secondary | ICD-10-CM | POA: Diagnosis not present

## 2018-08-21 DIAGNOSIS — Z7982 Long term (current) use of aspirin: Secondary | ICD-10-CM | POA: Diagnosis not present

## 2018-08-21 DIAGNOSIS — M1991 Primary osteoarthritis, unspecified site: Secondary | ICD-10-CM | POA: Diagnosis not present

## 2018-08-21 DIAGNOSIS — E785 Hyperlipidemia, unspecified: Secondary | ICD-10-CM | POA: Diagnosis not present

## 2018-08-21 DIAGNOSIS — I5033 Acute on chronic diastolic (congestive) heart failure: Secondary | ICD-10-CM | POA: Diagnosis not present

## 2018-08-21 DIAGNOSIS — N183 Chronic kidney disease, stage 3 (moderate): Secondary | ICD-10-CM | POA: Diagnosis not present

## 2018-08-21 DIAGNOSIS — D500 Iron deficiency anemia secondary to blood loss (chronic): Secondary | ICD-10-CM | POA: Diagnosis not present

## 2018-08-21 DIAGNOSIS — I2510 Atherosclerotic heart disease of native coronary artery without angina pectoris: Secondary | ICD-10-CM | POA: Diagnosis not present

## 2018-08-21 DIAGNOSIS — F4322 Adjustment disorder with anxiety: Secondary | ICD-10-CM | POA: Diagnosis not present

## 2018-08-21 DIAGNOSIS — E1122 Type 2 diabetes mellitus with diabetic chronic kidney disease: Secondary | ICD-10-CM | POA: Diagnosis not present

## 2018-08-21 DIAGNOSIS — I130 Hypertensive heart and chronic kidney disease with heart failure and stage 1 through stage 4 chronic kidney disease, or unspecified chronic kidney disease: Secondary | ICD-10-CM | POA: Diagnosis not present

## 2018-08-21 DIAGNOSIS — K5790 Diverticulosis of intestine, part unspecified, without perforation or abscess without bleeding: Secondary | ICD-10-CM | POA: Diagnosis not present

## 2018-08-21 DIAGNOSIS — E039 Hypothyroidism, unspecified: Secondary | ICD-10-CM | POA: Diagnosis not present

## 2018-08-21 NOTE — Telephone Encounter (Signed)
Called and spoke with patient. She stated that not she has not been taking the potassium. She has only been taking lasix. Pt is aware that she needs to take the potassium and Rx was sent to [facility] pt is requesting a refill [facility]. Sent to PCP for approval to send 90 day supply. Thanks

## 2018-08-21 NOTE — Telephone Encounter (Signed)
Called Robyn back to let her know I was able to speak with her mother Winward.   Robyn advised.

## 2018-08-21 NOTE — Telephone Encounter (Signed)
Message from [place] Faster sent at 08/21/2018 5:13 PM EST   Summary: med issue   Pt called back stated she did not need anyone to call her back, the pharmacy answered her question  ----- Message from Bea Graff, NT sent at 08/21/2018 4:54 PM EST ----- Pt states that her grandson picked up her rxs at the pharmacy and docusate sodium 100mg  tablets were in there and she is unsure if this is an error or if she is to take this medicine. Please advise.

## 2018-08-21 NOTE — Telephone Encounter (Signed)
Copied from [place] [phone]. Topic: Quick Communication - See Telephone Encounter >> Aug 21, 2018  3:22 PM Rutherford Nail, NT wrote: CRM for notification. See Telephone encounter for: 08/21/18.  See nurse triage call from 08/12/18. Patient's daughter, [name], [place] back. States that shelby had trouble getting in contact with the patient due to phone being off the hook. Robyn calling to let Wilburn Cornelia know that the phone is back working if she wanted to try and reach back out to the patient.

## 2018-08-21 NOTE — Telephone Encounter (Signed)
Called and spoke with patient's daughter due to difficulty calling patient. Daughter advised and stated that she will ask her mother these questions and will get back to me. Daughter is listed on patient's DPR cell number listed in patient's chart is her daughter's number.

## 2018-08-21 NOTE — Telephone Encounter (Signed)
Copied from [place] [phone]. Topic: Quick Communication - Home Health Verbal Orders >> Aug 21, 2018  3:34 PM Rutherford Nail, [place] wrote: Caller/Agency: Marlowe Kays with [facility] Number: [phone] Requesting OT/PT/Skilled Nursing/Social Work: OT Frequency: 2x a week for 1 week 1x a week for 1 week

## 2018-08-21 NOTE — Telephone Encounter (Signed)
Verbal given 

## 2018-08-22 MED ORDER — POTASSIUM CHLORIDE CRYS ER 10 MEQ PO TBCR: 10.0000 meq | EXTENDED_RELEASE_TABLET | Freq: Every day | ORAL | 1 refills | Status: DC

## 2018-08-22 NOTE — Telephone Encounter (Signed)
Sent to her mail order

## 2018-08-22 NOTE — Addendum Note (Signed)
Addended by: [name] on: 08/22/2018 09:46 AM   Modules accepted: Orders

## 2018-08-23 ENCOUNTER — Telehealth: Payer: Self-pay | Admitting: Family Medicine

## 2018-08-23 ENCOUNTER — Ambulatory Visit (INDEPENDENT_AMBULATORY_CARE_PROVIDER_SITE_OTHER): Payer: PPO | Admitting: Pulmonary Disease

## 2018-08-23 ENCOUNTER — Encounter: Payer: Self-pay | Admitting: Pulmonary Disease

## 2018-08-23 VITALS — BP 138/80 | HR 78 | Ht 61.0 in | Wt 210.0 lb

## 2018-08-23 DIAGNOSIS — I272 Pulmonary hypertension, unspecified: Secondary | ICD-10-CM

## 2018-08-23 DIAGNOSIS — R600 Localized edema: Secondary | ICD-10-CM

## 2018-08-23 DIAGNOSIS — G4719 Other hypersomnia: Secondary | ICD-10-CM | POA: Diagnosis not present

## 2018-08-23 DIAGNOSIS — I5082 Biventricular heart failure: Secondary | ICD-10-CM

## 2018-08-23 DIAGNOSIS — R0902 Hypoxemia: Secondary | ICD-10-CM | POA: Diagnosis not present

## 2018-08-23 NOTE — Telephone Encounter (Signed)
FYI: Call to pharmacy-[facility]- patient was getting potassium extended release 10 meq = ( equivalent to ) Klor-Con 10 meq

## 2018-08-23 NOTE — Patient Instructions (Addendum)
In evaluation of pulmonary hypertension (blood pressure in your lungs) the following 2 studies have been ordered:  1) lung function tests (PFTs) 2) sleep study  We will try to arrange these tests for January 2020 per your request  Follow-up in 2 months after the above tests have been performed and interpreted

## 2018-08-23 NOTE — Telephone Encounter (Signed)
Please advise 

## 2018-08-23 NOTE — Telephone Encounter (Signed)
Left message to call back regarding question about Rx clarification.

## 2018-08-23 NOTE — Telephone Encounter (Signed)
Diane Mullins Engineer, building services) called to ask if she should dispense:   Potassium wax or potassium micro dispersible. Please advise.  [phone]

## 2018-08-23 NOTE — Telephone Encounter (Signed)
Copied from [place] [id number]. Topic: Quick Communication - Rx Refill/Question >> Aug 23, 2018  9:56 AM Reyne Dumas L wrote: Medication:  potassium chloride (K-DUR,KLOR-CON) 10 MEQ tablet   Christy with [facility] - [place], [place] [phone] (Phone) [phone] (Fax) states that they received RX for pt but she is getting a different type of potassium through local pharmacy and they are trying to clear up confusion. Alyse Low would like a call back at [phone]

## 2018-08-23 NOTE — Telephone Encounter (Signed)
Please have provider review- may need to send new Rx to mail order pharmacy.

## 2018-08-26 DIAGNOSIS — I2510 Atherosclerotic heart disease of native coronary artery without angina pectoris: Secondary | ICD-10-CM | POA: Diagnosis not present

## 2018-08-26 DIAGNOSIS — I130 Hypertensive heart and chronic kidney disease with heart failure and stage 1 through stage 4 chronic kidney disease, or unspecified chronic kidney disease: Secondary | ICD-10-CM | POA: Diagnosis not present

## 2018-08-26 DIAGNOSIS — E785 Hyperlipidemia, unspecified: Secondary | ICD-10-CM | POA: Diagnosis not present

## 2018-08-26 DIAGNOSIS — N183 Chronic kidney disease, stage 3 (moderate): Secondary | ICD-10-CM | POA: Diagnosis not present

## 2018-08-26 DIAGNOSIS — E1122 Type 2 diabetes mellitus with diabetic chronic kidney disease: Secondary | ICD-10-CM | POA: Diagnosis not present

## 2018-08-26 DIAGNOSIS — Z9181 History of falling: Secondary | ICD-10-CM | POA: Diagnosis not present

## 2018-08-26 DIAGNOSIS — K529 Noninfective gastroenteritis and colitis, unspecified: Secondary | ICD-10-CM | POA: Diagnosis not present

## 2018-08-26 DIAGNOSIS — M1991 Primary osteoarthritis, unspecified site: Secondary | ICD-10-CM | POA: Diagnosis not present

## 2018-08-26 DIAGNOSIS — D500 Iron deficiency anemia secondary to blood loss (chronic): Secondary | ICD-10-CM | POA: Diagnosis not present

## 2018-08-26 DIAGNOSIS — Z6839 Body mass index (BMI) 39.0-39.9, adult: Secondary | ICD-10-CM | POA: Diagnosis not present

## 2018-08-26 DIAGNOSIS — Z794 Long term (current) use of insulin: Secondary | ICD-10-CM | POA: Diagnosis not present

## 2018-08-26 DIAGNOSIS — I5033 Acute on chronic diastolic (congestive) heart failure: Secondary | ICD-10-CM | POA: Diagnosis not present

## 2018-08-26 DIAGNOSIS — H8110 Benign paroxysmal vertigo, unspecified ear: Secondary | ICD-10-CM | POA: Diagnosis not present

## 2018-08-26 DIAGNOSIS — I272 Pulmonary hypertension, unspecified: Secondary | ICD-10-CM | POA: Diagnosis not present

## 2018-08-26 DIAGNOSIS — Z7982 Long term (current) use of aspirin: Secondary | ICD-10-CM | POA: Diagnosis not present

## 2018-08-26 DIAGNOSIS — Z8673 Personal history of transient ischemic attack (TIA), and cerebral infarction without residual deficits: Secondary | ICD-10-CM | POA: Diagnosis not present

## 2018-08-26 DIAGNOSIS — K5790 Diverticulosis of intestine, part unspecified, without perforation or abscess without bleeding: Secondary | ICD-10-CM | POA: Diagnosis not present

## 2018-08-26 DIAGNOSIS — D271 Benign neoplasm of left ovary: Secondary | ICD-10-CM | POA: Diagnosis not present

## 2018-08-26 DIAGNOSIS — E039 Hypothyroidism, unspecified: Secondary | ICD-10-CM | POA: Diagnosis not present

## 2018-08-26 DIAGNOSIS — F4322 Adjustment disorder with anxiety: Secondary | ICD-10-CM | POA: Diagnosis not present

## 2018-08-27 ENCOUNTER — Encounter: Payer: Self-pay | Admitting: Family Medicine

## 2018-08-27 ENCOUNTER — Ambulatory Visit (INDEPENDENT_AMBULATORY_CARE_PROVIDER_SITE_OTHER): Payer: PPO | Admitting: Family Medicine

## 2018-08-27 VITALS — BP 126/60 | HR 85 | Temp 97.6°F | Ht 61.0 in | Wt 211.4 lb

## 2018-08-27 DIAGNOSIS — Z794 Long term (current) use of insulin: Secondary | ICD-10-CM

## 2018-08-27 DIAGNOSIS — I10 Essential (primary) hypertension: Secondary | ICD-10-CM

## 2018-08-27 DIAGNOSIS — E1159 Type 2 diabetes mellitus with other circulatory complications: Secondary | ICD-10-CM | POA: Diagnosis not present

## 2018-08-27 DIAGNOSIS — R5383 Other fatigue: Secondary | ICD-10-CM | POA: Diagnosis not present

## 2018-08-27 DIAGNOSIS — R110 Nausea: Secondary | ICD-10-CM | POA: Diagnosis not present

## 2018-08-27 DIAGNOSIS — E611 Iron deficiency: Secondary | ICD-10-CM

## 2018-08-27 LAB — COMPREHENSIVE METABOLIC PANEL
ALBUMIN: 4.1 g/dL (ref 3.5–5.2)
ALT: 9 U/L (ref 0–35)
AST: 13 U/L (ref 0–37)
Alkaline Phosphatase: 100 U/L (ref 39–117)
BUN: 16 mg/dL (ref 6–23)
CALCIUM: 9.3 mg/dL (ref 8.4–10.5)
CHLORIDE: 99 meq/L (ref 96–112)
CO2: 32 meq/L (ref 19–32)
CREATININE: 1.15 mg/dL (ref 0.40–1.20)
GFR: 47.79 mL/min — AB (ref >60.00)
Glucose, Bld: 248 mg/dL — ABNORMAL HIGH (ref 70–99)
POTASSIUM: 5 meq/L (ref 3.5–5.1)
Sodium: 138 mEq/L (ref 135–145)
Total Bilirubin: 0.2 mg/dL (ref 0.2–1.2)
Total Protein: 7 g/dL (ref 6.0–8.3)

## 2018-08-27 LAB — CBC WITH DIFFERENTIAL/PLATELET
BASOS PCT: 1.2 % (ref 0.0–3.0)
Basophils Absolute: 0.1 10*3/uL (ref 0.0–0.1)
EOS ABS: 0.2 10*3/uL (ref 0.0–0.7)
EOS PCT: 1.6 % (ref 0.0–5.0)
HCT: 37.4 % (ref 36.0–46.0)
HEMOGLOBIN: 12 g/dL (ref 12.0–15.0)
Lymphocytes Relative: 31.4 % (ref 12.0–46.0)
Lymphs Abs: 3 10*3/uL (ref 0.7–4.0)
MCHC: 32.1 g/dL (ref 30.0–36.0)
MCV: 80 fl (ref 78.0–100.0)
Monocytes Absolute: 0.7 10*3/uL (ref 0.1–1.0)
Monocytes Relative: 7.8 % (ref 3.0–12.0)
NEUTROS ABS: 5.5 10*3/uL (ref 1.4–7.7)
Neutrophils Relative %: 58 % (ref 43.0–77.0)
PLATELETS: 248 10*3/uL (ref 150.0–400.0)
RBC: 4.68 Mil/uL (ref 3.87–5.11)
RDW: 23.4 % — AB (ref 11.5–15.5)
WBC: 9.4 10*3/uL (ref 4.0–10.5)

## 2018-08-27 LAB — MAGNESIUM: MAGNESIUM: 1.6 mg/dL (ref 1.5–2.5)

## 2018-08-27 MED ORDER — POTASSIUM CHLORIDE ER 10 MEQ PO TBCR: 10.0000 meq | EXTENDED_RELEASE_TABLET | Freq: Every day | ORAL | 1 refills | Status: DC

## 2018-08-27 MED ORDER — INSULIN GLARGINE 100 UNIT/ML SOLOSTAR PEN: PEN_INJECTOR | SUBCUTANEOUS | 2 refills | Status: DC

## 2018-08-27 MED ORDER — BISMUTH SUBSALICYLATE 262 MG PO CHEW: CHEWABLE_TABLET | ORAL | 2 refills | Status: DC

## 2018-08-27 MED ORDER — METOPROLOL SUCCINATE ER 25 MG PO TB24: 25.0000 mg | ORAL_TABLET | Freq: Every day | ORAL | 1 refills | Status: DC

## 2018-08-27 NOTE — Patient Instructions (Signed)
Increase lantus insulin to 17 units per day

## 2018-08-27 NOTE — Progress Notes (Signed)
Subjective:    Patient ID: Diane Mullins, female    DOB: 05-Mar-1933, 84 y.o.   MRN: [id number]  HPI   Presents to clinic for 4 week follow up on diabetes medications/blood sugars and discuss blood pressures.  After patient was in the hospital and the skilled nursing facility, medication changes were made.  Patient has stopped the glimepiride and her Lantus dose was decreased to 12 units, at last visit we did bump the Lantus up to 14 units/day.  Glimepiride was stopped because patient was having overnight lows.  Patient has been monitoring her blood sugars at home and her range has been in the 150s to 180s, did have 2 readings over the past 4 weeks that were 200 and then 222.  Patient states when she is taking the glimepiride she never had readings like this in is concerned that she might need to go back on glimepiride.  Blood pressures have been improved since adding losartan back on at last visit.  Patient states she was unable to pick up new metoprolol prescription of 25 mg, so was breaking her old 100 mg tablets in half to be taking 50 mg metoprolol per day.  With taking that her blood pressures have been in the 120s over 60s at home.  Patient also complains of nauseousness that has been off and on for the past many weeks.  Patient states she had Jell-O, cottage cheese and peaches for lunch today, but continues to feel nauseous.  Patient states she was also started on a magnesium supplement and is unsure if she is supposed to continue taking.  Patient also is concerned about her iron levels, states she was given iron transfusion a few times in the hospital and is unsure if she may need more.  Lab Results  Component Value Date   HGBA1C 8.1 (H) 07/05/2018    Patient Active Problem List   Diagnosis Date Noted  . 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 AKI (acute kidney injury) ([place])   . Acute on chronic diastolic heart failure ([place])   . Respiratory failure ([place]) 07/05/2018  . Paroxysmal  tachycardia ([place]) 11/16/2017  . Coronary artery calcification seen on CAT scan 11/16/2017  . Morbid obesity ([place]) 10/30/2017  . (HFpEF) heart failure with preserved ejection fraction ([place]) 09/28/2017  . Family history of colon cancer 04/17/2017  . Closed right ankle fracture 10/28/2015  . Fatigue 10/18/2015  . TIA (transient ischemic attack) 09/18/2015  . Orthostatic hypotension 08/02/2015  . Adjustment disorder with anxious mood 03/30/2015  . Postmenopausal estrogen deficiency 06/03/2014  . DNR (do not resuscitate) discussion 03/04/2014  . Gait disturbance 05/07/2013  . Diabetes ([place]) 04/21/2013  . Shortness of breath 12/09/2012  . Hearing loss 12/09/2012  . Anxiety 01/31/2012  . Hypertension 12/22/2011  . Hyperlipidemia 12/22/2011  . Hypothyroidism 12/22/2011   Social History   Tobacco Use  . Smoking status: Never Smoker  . Smokeless tobacco: Never Used  Substance Use Topics  . Alcohol use: No    Review of Systems  Constitutional: Negative for chills, fatigue and fever.  HENT: Negative for congestion, ear pain, sinus pain and sore throat.   Eyes: Negative.   Respiratory: Negative for cough, shortness of breath and wheezing.   Cardiovascular: Negative for chest pain, palpitations and leg swelling.  Gastrointestinal: +NAUSEA. Negative for abdominal pain, diarrhea, and vomiting.  Genitourinary: Negative for dysuria, frequency and urgency.  Musculoskeletal: Negative for arthralgias and myalgias.  Skin: Negative for color change, pallor and rash.  Neurological: Negative for syncope, light-headedness and headaches.  Psychiatric/Behavioral: The patient is not nervous/anxious.       Objective:   Physical Exam  Constitutional: She is oriented to person, place, and time. No distress.  HENT:  Head: Normocephalic and atraumatic.  Eyes: Conjunctivae and EOM are normal. No scleral icterus.  Neck: Neck supple. No tracheal deviation present.  Cardiovascular: Normal rate and  regular rhythm.  Pulmonary/Chest: Effort normal and breath sounds normal. No respiratory distress.  Abdominal: Soft. Bowel sounds are normal. There is no tenderness. There is no guarding.  Musculoskeletal:  Walks with 4 wheeled walker.  Neurological: She is alert and oriented to person, place, and time.  Skin: Skin is warm and dry. No rash noted. No pallor.  Psychiatric: She has a normal mood and affect. Her behavior is normal.  Nursing note and vitals reviewed.  BP Readings from Last 3 Encounters:  08/27/18 126/60  08/23/18 138/80  08/07/18 136/80   Wt Readings from Last 3 Encounters:  08/27/18 211 lb 6.4 oz (95.9 kg)  08/23/18 210 lb (95.3 kg)  08/07/18 199 lb 3 oz (90.4 kg)   Patient has gained 12 pounds since her visit in November.  Today's Vitals   08/27/18 1416  BP: 126/60  Pulse: 85  Temp: 97.6 F (36.4 C)  TempSrc: Oral  SpO2: 96%  Weight: 211 lb 6.4 oz (95.9 kg)  Height: 5\' 1"  (1.549 m)   Body mass index is 39.94 kg/m.     Assessment & Plan:   Essential hypertension-patient's blood pressures from her home log to overall look good, but she has been taking double the dose of metoprolol that was prescribed.  I have sent in Toprol 25 mg to her mail away pharmacy so we will contact her doorstep.  Patient will continue to keep a log of blood pressure readings at home.  Type 2 diabetes-blood sugars overall look decently controlled, I would prefer not to start patient back on glimepiride due to her having overnight lows with this medication in the past.  Patient and I discussed different options and we both agreed that we can go up a few units on the Lantus rather than going back on glimepiride.  Patient will take Lantus 17 units once per day.  She will continue to keep a log of blood sugar readings.  Nausea- unclear reason for nausea.  Patient advised to eat bland foods and slowly advance diet as tolerated.  We will get lab work today to further investigate  nausea.  Fatigue - lab work will help [place] further investigate fatigue.  Hypomagnesemia -we will get magnesium level and lab work today  CBC, CMP, iron levels, magnesium level, thyroid panel drawn in clinic today  Patient will follow-up in clinic in about 4 weeks for recheck on chronic medical conditions and we will also get a new A1c at that time.

## 2018-08-27 NOTE — Telephone Encounter (Signed)
Noted.  I sent the exact same prescription to her local pharmacy.  She can be on the immediate release potassium supplement that was previously sent in.

## 2018-08-27 NOTE — Progress Notes (Signed)
PULMONARY CONSULT NOTE  Requesting MD/Service: Dorothea Glassman, MD Date of initial consultation: 08/23/18 Reason for consultation: Pulmonary hypertension  PT PROFILE: 84 y.o. female referred for evaluation of pulmonary hypertension incidentally found on recent echocardiogram  DATA: 09/19/15 echocardiogram: RVSP estimate 35 mmHg 07/05/18 CT chest: Suspect mild pulmonary edema. Mild dependent atelectasis in the lower lobes. Pulmonary arterial enlargement which can be seen with pulmonary hypertension. Large sliding hiatal hernia 07/06/18 Echocardiogram: LVEF 16-10%, grade 2 diastolic dysfunction.  Mild MR.  LA mildly to moderately dilated.  RV size and systolic function normal.  RA size normal.  RVSP estimated 52 mmHg.  INTERVAL:  HPI:  She really does not understand why she has been referred to me.  She was hospitalized at the end of October with a "virus" when she presented with profound weakness.  During that hospitalization she underwent echocardiogram with results as noted above.  She was seen in follow-up in the cardiology clinic and the pulmonary hypertension on echocardiogram was noted.  This appears to be the reason for referral.  She does report intermittent orthopnea.  She has had paroxysmal nocturnal dyspnea in the past, but not recently.  She has occasional palpitations.  She denies CP, fever, purulent sputum, hemoptysis and calf tenderness.  She has never smoked.  She has no prior documented history of pulmonary diseases.  She is unaware if she snores.  She does have mild daytime hypersomnolence.  She has no significant occupational or environmental exposures.  She has no significant travel history.  Past Medical History:  Diagnosis Date  . Benign paroxysmal positional vertigo 07/17/2014  . Chronic diastolic CHF (congestive heart failure) ([place])    a. echo 10/19: EF of 60-65%, no RWMA, Gr2DD, calcified mitral annulus with mild MR, mildly to moderately dilated left atrium,  moderate TR, PASP 52 mmHg  . Diabetes mellitus   . H/O: rheumatic fever   . Hyperlipidemia   . Hypertension   . Pulmonary hypertension ([place])   . Stroke [facility])    TIA Jan. 1st  . Thyroid disease     Past Surgical History:  Procedure Laterality Date  . ABDOMINAL HYSTERECTOMY  1973   menorrhagia  . HERNIA REPAIR  1994  . ORIF ANKLE FRACTURE Right 10/29/2015   Procedure: OPEN REDUCTION INTERNAL FIXATION (ORIF) ANKLE FRACTURE;  Surgeon: Thornton Park, MD;  Location: [facility] ORS;  Service: Orthopedics;  Laterality: Right;  Marland Kitchen VAGINAL DELIVERY     x4    MEDICATIONS: I have reviewed all medications and confirmed regimen as documented  Social History   Socioeconomic History  . Marital status: Widowed    Spouse name: Not on file  . Number of children: 4  . Years of education: Not on file  . Highest education level: Not on file  Occupational History  . Not on file  Social Needs  . Financial resource strain: Not hard at all  . Food insecurity:    Worry: Never true    Inability: Never true  . Transportation needs:    Medical: No    Non-medical: No  Tobacco Use  . Smoking status: Never Smoker  . Smokeless tobacco: Never Used  Substance and Sexual Activity  . Alcohol use: No  . Drug use: No  . Sexual activity: Never  Lifestyle  . Physical activity:    Days per week: 3 days    Minutes per session: 50 min  . Stress: Not at all  Relationships  . Social connections:    Talks on phone: Not  on file    Gets together: Not on file    Attends religious service: Not on file    Active member of club or organization: Not on file    Attends meetings of clubs or organizations: Not on file    Relationship status: Not on file  . Intimate partner violence:    Fear of current or ex partner: Not on file    Emotionally abused: Not on file    Physically abused: Not on file    Forced sexual activity: Not on file  Other Topics Concern  . Not on file  Social History Narrative   Lives in  [place] alone. No pets. Retired from [employer].   Diet: healthy   Exercise: water aerobics twice weekly, balance class   Ambulates with a walker at baseline    Family History  Problem Relation Age of Onset  . Heart disease Mother 68  . Heart attack Mother 25  . Cancer Father        colon  . Diabetes Sister   . Hypertension Sister   . Cancer Brother        kidney  . Diabetes Brother   . Cancer Daughter        ovarian  . Non-Hodgkin's lymphoma Son     ROS: No fever, myalgias/arthralgias, unexplained weight loss or weight gain No new focal weakness or sensory deficits No otalgia, hearing loss, visual changes, nasal and sinus symptoms, mouth and throat problems No neck pain or adenopathy No abdominal pain, N/V/D, diarrhea, change in bowel pattern No dysuria, change in urinary pattern   Vitals:   08/23/18 1054 08/23/18 1101  BP:  138/80  Pulse:  78  SpO2:  92%  Weight: 210 lb (95.3 kg)   Height: 5\' 1"  (1.549 m)   Room air  EXAM:  Gen: Obese, No overt respiratory distress at rest,  HEENT: NCAT, sclera white, oropharynx normal Neck: Supple without LAN, thyromegaly, JVD Lungs: breath sounds mildly diminished, percussion normal, adventitious sounds: None Cardiovascular: RRR, no murmurs noted Abdomen: Soft, nontender, normal BS Ext: without clubbing, cyanosis.  Symmetric lower extremity edema noted Neuro: CNs grossly intact, no focal motor and sensory deficits noted, very weak in lower extremities with difficulty stepping up to exam table Skin: Limited exam, no lesions noted  DATA:   BMP Latest Ref Rng & Units 08/02/2018 07/30/2018 07/09/2018  Glucose 65 - 99 mg/dL 199(H) 156(H) 100(H)  BUN 8 - 27 mg/dL 11 14 13   Creatinine 0.57 - 1.00 mg/dL 1.06(H) 0.99 0.98  BUN/Creat Ratio 12 - 28 10(L) - -  Sodium 134 - 144 mmol/L 142 139 139  Potassium 3.5 - 5.2 mmol/L 4.9 4.9 4.0  Chloride 96 - 106 mmol/L 100 102 99  CO2 20 - 29 mmol/L 24 30 35(H)  Calcium 8.7 - 10.3 mg/dL 9.5 9.2  8.5(L)    CBC Latest Ref Rng & Units 08/02/2018 07/30/2018 07/09/2018  WBC 3.4 - 10.8 x10E3/uL 6.7 6.8 7.7  Hemoglobin 11.1 - 15.9 g/dL 11.2 10.9(L) 8.8(L)  Hematocrit 34.0 - 46.6 % 35.4 35.3(L) 31.3(L)  Platelets 150 - 450 x10E3/uL 255 250.0 331    CXR 10/22: Cardiomegaly, vascular congestion, mild interstitial prominence, relatively low lung volumes  I have personally reviewed all chest radiographs reported above including CXRs and CT chest unless otherwise indicated  IMPRESSION:     ICD-10-CM   1. Pulmonary hypertension (HCC) I27.20 Pulmonary Function Test [facility] Only    CANCELED: Home sleep test  2. Biventricular congestive  heart failure (HCC) I50.82   3. Daytime hypersomnolence G47.19 Split night study  4. Borderline hypoxemia R09.02   5. Severe obesity E66.01   6. Lower extremity edema R60.0    It is highly unlikely that she has Group 1 pulmonary hypertension.  Pulmonary hypertension is most likely on the basis of diastolic left heart failure (as evidenced by dilated left atrium suggesting elevated LVEDP).  It is notable that she has borderline hypoxemia while awake which might be indicative of obesity hypoventilation syndrome.  She also reports daytime hypersomnolence which suggests possible obstructive sleep apnea.  I discussed my concerns with her regarding the above possible diagnoses.  She is not very enthusiastic about evaluating any of this.  She wants to do the minimum evaluation necessary and try to delay it to after the holidays.  PLAN:  PFTs ordered Home sleep study ordered Follow-up in 2 months after above results are available   Merton Border, MD PCCM service Mobile [phone] Pager [phone] 08/27/2018 9:05 AM

## 2018-08-28 ENCOUNTER — Encounter: Payer: Self-pay | Admitting: Family Medicine

## 2018-08-28 LAB — THYROID PANEL WITH TSH
Free Thyroxine Index: 3.1 (ref 1.4–3.8)
T3 Uptake: 28 % (ref 22–35)
T4, Total: 10.9 ug/dL (ref 5.1–11.9)
TSH: 1.25 m[IU]/L (ref 0.40–4.50)

## 2018-08-28 LAB — IRON,TIBC AND FERRITIN PANEL
%SAT: 11 % — AB (ref 16–45)
FERRITIN: 18 ng/mL (ref 16–288)
Iron: 40 ug/dL — ABNORMAL LOW (ref 45–160)
TIBC: 374 mcg/dL (calc) (ref 250–450)

## 2018-08-29 ENCOUNTER — Telehealth: Payer: Self-pay | Admitting: Family Medicine

## 2018-08-29 NOTE — Telephone Encounter (Signed)
Copied from [place] [phone]. Topic: Quick Communication - See Telephone Encounter >> Aug 29, 2018  2:59 PM Antonieta Iba C wrote: CRM for notification. See Telephone encounter for: 08/29/18.  Pt is calling in for lab results.  CB: [phone]

## 2018-08-29 NOTE — Telephone Encounter (Signed)
These have not yet been resulted. Please advise?

## 2018-08-30 MED ORDER — FERROUS SULFATE 325 (65 FE) MG PO TBEC: 325.0000 mg | DELAYED_RELEASE_TABLET | Freq: Every day | ORAL | 1 refills | Status: DC

## 2018-08-30 NOTE — Telephone Encounter (Signed)
Called patient and left a VM to call back.  

## 2018-08-30 NOTE — Addendum Note (Signed)
Addended by: Philis Nettle on: 08/30/2018 01:02 PM   Modules accepted: Orders

## 2018-08-30 NOTE — Telephone Encounter (Signed)
Pt would like to have lab results given to her daughter when they are ready. Please advise [phone]

## 2018-08-30 NOTE — Telephone Encounter (Signed)
Just an FYI, but these labs still have not been released.

## 2018-09-02 ENCOUNTER — Encounter: Payer: Self-pay | Admitting: Lab

## 2018-09-03 ENCOUNTER — Telehealth: Payer: Self-pay | Admitting: Family Medicine

## 2018-09-03 DIAGNOSIS — E039 Hypothyroidism, unspecified: Secondary | ICD-10-CM

## 2018-09-03 NOTE — Telephone Encounter (Signed)
Called patient left a VM to call back.

## 2018-09-03 NOTE — Telephone Encounter (Signed)
Called patient and unable to leave a VM to call back called patient cell number phone line just kept ringing.

## 2018-09-03 NOTE — Telephone Encounter (Signed)
Please let the patient know that her pharmacy is having to change the manufacturer of her Synthroid.  Due to this we need to have her come in in 6 weeks to check her TSH to ensure that the dose remains correct with this change in manufacturer.  I have placed an order.  Please get her scheduled.  Thanks.

## 2018-09-04 ENCOUNTER — Telehealth: Payer: Self-pay | Admitting: *Deleted

## 2018-09-04 NOTE — Telephone Encounter (Signed)
Called and spoke with patient. Pt advised and results have been mailed to patient as requested.

## 2018-09-04 NOTE — Telephone Encounter (Signed)
Copied from [place] [phone]. Topic: General - Other >> Sep 04, 2018  9:58 AM Cecelia Byars, NT wrote: Reason for CRM: Patient says the office called her and did not leave a message ,please advise

## 2018-09-04 NOTE — Telephone Encounter (Signed)
Called and spoke with patient. Pt advised and will hav her lab work done at her next [place] with PCP in Feb 2020

## 2018-09-12 ENCOUNTER — Other Ambulatory Visit: Payer: Self-pay | Admitting: Family Medicine

## 2018-09-25 ENCOUNTER — Ambulatory Visit
Admission: RE | Admit: 2018-09-25 | Discharge: 2018-09-25 | Disposition: A | Discharge status: Home / Self Care | Payer: PPO | Source: Ambulatory Visit | Attending: Obstetrics and Gynecology | Admitting: Obstetrics and Gynecology

## 2018-09-25 ENCOUNTER — Inpatient Hospital Stay: Payer: PPO | Attending: Obstetrics and Gynecology

## 2018-09-25 DIAGNOSIS — D271 Benign neoplasm of left ovary: Secondary | ICD-10-CM | POA: Insufficient documentation

## 2018-09-25 DIAGNOSIS — R978 Other abnormal tumor markers: Secondary | ICD-10-CM | POA: Diagnosis not present

## 2018-09-25 DIAGNOSIS — R971 Elevated cancer antigen 125 [CA 125]: Secondary | ICD-10-CM | POA: Diagnosis not present

## 2018-09-25 DIAGNOSIS — D279 Benign neoplasm of unspecified ovary: Secondary | ICD-10-CM | POA: Diagnosis not present

## 2018-09-25 DIAGNOSIS — Z1273 Encounter for screening for malignant neoplasm of ovary: Secondary | ICD-10-CM | POA: Diagnosis not present

## 2018-09-26 LAB — CA 125: Cancer Antigen (CA) 125: 56.4 U/mL — ABNORMAL HIGH (ref 0.0–38.1)

## 2018-09-27 ENCOUNTER — Inpatient Hospital Stay: Payer: PPO | Admitting: Family Medicine

## 2018-10-02 ENCOUNTER — Inpatient Hospital Stay (HOSPITAL_BASED_OUTPATIENT_CLINIC_OR_DEPARTMENT_OTHER): Payer: PPO | Admitting: Obstetrics and Gynecology

## 2018-10-02 ENCOUNTER — Encounter (INDEPENDENT_AMBULATORY_CARE_PROVIDER_SITE_OTHER): Payer: Self-pay

## 2018-10-02 ENCOUNTER — Encounter: Payer: Self-pay | Admitting: Obstetrics and Gynecology

## 2018-10-02 VITALS — BP 114/80 | HR 92 | Temp 97.7°F | Resp 18 | Ht 61.0 in | Wt 206.9 lb

## 2018-10-02 DIAGNOSIS — D271 Benign neoplasm of left ovary: Secondary | ICD-10-CM | POA: Diagnosis not present

## 2018-10-02 NOTE — Progress Notes (Signed)
Gynecologic Oncology Interval Visit   Referring Provider: Harlin Heys, MD  Chief Concern: Ovarian mass  Subjective:  Diane Mullins is a 85 y.o. female s/p TAH (benign disease, ovaries in situ), initially seen in consultation from Dr. Amalia Hailey for ovarian mass consistent with teratoma with elevated CA-125 with multiple medical comorbidities, elected for observation. She returns today for follow-up.   She saw Dr. Theora Gianotti on 08/07/18 and discussed options for management including observation versus surgery given her being high risk for surgery. We also discussed rationale about why I would not recommended CT guided biopsy. Teratomas are associated with chemical peritonitis and I am concerned about leakage after a biopsy. We reviewed that the majority of teratomas are benign but malignancy can be present on rare occasion. Her risk of malignancy may be higher given older age. ROMA scoring was 47% based on my calculation but again that may not be accurate. Her daughter, Shirlean Mylar, was involved in the conversation via telephone. Patient elected for observation.   09/25/2018- US Pelvic Complete with Transvaginal:  - LEFT adnexa measuring 6.8 x 7.3 x 5.8 cm - Surgical absence of uterus with nonvisualization of RIGHT ovary. - Heterogeneous isoechoic to mildly hyperechoic LEFT adnexal mass 6.8 x 7.3 x 5.8 cm in size consistent with the LEFT ovarian dermoid tumor seen on the prior CT exam.  CA 125 09/25/2018 56.4  Today, she reports fatigue and weakness since hospitalization in 06/2018. She continues to be followed by cardiology and pulmonology. She has returned to her to her independent living facility. She reports decreased appetite with 5 lb weight loss.     Gynecologic History:  Diane Mullins presented as 85 year old female, initially seen in consultation from Dr. Amalia Hailey for second opinion and possible management as needed for an ovarian mass c/w teratoma in the setting of multiple medical comorbidities.     The patient has a history of HFpEF/pulmonary hypertension, DM2, HTN, HLD, prior stroke and vertigo who presents for hospital follow up after recent admission to [facility] from 10/18 to 10/22 for acute respiratory distress in the setting of acute on chronic diastolic CHF, worsening anemia, diarrhea, and hypoalbuminemia.  07/05/2018 CT scan obtained fpr acuite generalized abdominal pain and revealed.  - Reproductive: Status post hysterectomy. 7.4 cm fat containing mass is noted in left adnexal region consistent with dermoid tumor. - Large sliding-type hiatal hernia.  7.4 cm fat containing mass seen in left adnexal region consistent with left ovarian dermoid tumor. Sigmoid diverticulosis without inflammation. Aortic Atherosclerosis (ICD10-I70.0).  CA125 54.3 HE4 128 ROMA score 4.7 elevated  Dr. Amalia Hailey recommended expectant management given her medical issues.   Today she reports that she is living at [facility], an independent retirement community. She is off oxygen and her breathing has improved since discharge. EF of 60% w/ grade 2 diastolic dysfunction.  She is independent of her ADLs. She had hysterectomy and removal of right ovary in her 30s for abnormal uterine bleeding. She complains of urinary incontinence for past 2-3 years. Blood sugars have been elevated at home recently which she believes is due to dose decrease in medication while hospitalized. Last hA1c 8.1 (06/2018).  She denied pelvic pain or pressure. Denies post-menopausal bleeding. Had 12lb weight loss in past month which she has accounted to 'water pills'.    Problem List: Patient Active Problem List   Diagnosis Date Noted  . 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 AKI (acute kidney injury) ([place])   . Acute on chronic diastolic heart  failure ([place])   . Respiratory failure ([place]) 07/05/2018  . Paroxysmal tachycardia ([place]) 11/16/2017  . Coronary artery calcification seen on CAT scan 11/16/2017  . Morbid obesity ([place]) 10/30/2017   . (HFpEF) heart failure with preserved ejection fraction ([place]) 09/28/2017  . Family history of colon cancer 04/17/2017  . Closed right ankle fracture 10/28/2015  . Fatigue 10/18/2015  . TIA (transient ischemic attack) 09/18/2015  . Orthostatic hypotension 08/02/2015  . Adjustment disorder with anxious mood 03/30/2015  . Postmenopausal estrogen deficiency 06/03/2014  . DNR (do not resuscitate) discussion 03/04/2014  . Gait disturbance 05/07/2013  . Diabetes ([place]) 04/21/2013  . Shortness of breath 12/09/2012  . Hearing loss 12/09/2012  . Anxiety 01/31/2012  . Hypertension 12/22/2011  . Hyperlipidemia 12/22/2011  . Hypothyroidism 12/22/2011    Past Medical History: Past Medical History:  Diagnosis Date  . Benign paroxysmal positional vertigo 07/17/2014  . Chronic diastolic CHF (congestive heart failure) ([place])    a. echo 10/19: EF of 60-65%, no RWMA, Gr2DD, calcified mitral annulus with mild MR, mildly to moderately dilated left atrium, moderate TR, PASP 52 mmHg  . Diabetes mellitus   . H/O: rheumatic fever   . Hyperlipidemia   . Hypertension   . Pulmonary hypertension ([place])   . Stroke [facility])    TIA Jan. 1st  . Teratoma of left ovary   . Thyroid disease     Past Surgical History: Past Surgical History:  Procedure Laterality Date  . ABDOMINAL HYSTERECTOMY  1973   menorrhagia  . HERNIA REPAIR  1994  . ORIF ANKLE FRACTURE Right 10/29/2015   Procedure: OPEN REDUCTION INTERNAL FIXATION (ORIF) ANKLE FRACTURE;  Surgeon: Thornton Park, MD;  Location: [facility] ORS;  Service: Orthopedics;  Laterality: Right;  Marland Kitchen VAGINAL DELIVERY     x4    Past Gynecologic History:  Hysterectomy and unilateral ovary removal in her 48s for abnormal bleeding.  Denies abnormal pap smears, hx of STDs.    OB History: Multiparous SVDs OB History  No obstetric history on file.    Family History: Family History  Problem Relation Age of Onset  . Heart disease Mother 44  . Heart attack Mother 16  .  Cancer Father        colon  . Diabetes Sister   . Hypertension Sister   . Cancer Brother        kidney  . Diabetes Brother   . Cancer Daughter        ovarian  . Non-Hodgkin's lymphoma Son     Social History: Social History   Socioeconomic History  . Marital status: Widowed    Spouse name: Not on file  . Number of children: 4  . Years of education: Not on file  . Highest education level: Not on file  Occupational History  . Not on file  Social Needs  . Financial resource strain: Not hard at all  . Food insecurity:    Worry: Never true    Inability: Never true  . Transportation needs:    Medical: No    Non-medical: No  Tobacco Use  . Smoking status: Never Smoker  . Smokeless tobacco: Never Used  Substance and Sexual Activity  . Alcohol use: No  . Drug use: No  . Sexual activity: Never  Lifestyle  . Physical activity:    Days per week: 3 days    Minutes per session: 50 min  . Stress: Not at all  Relationships  . Social connections:    Talks on  phone: Not on file    Gets together: Not on file    Attends religious service: Not on file    Active member of club or organization: Not on file    Attends meetings of clubs or organizations: Not on file    Relationship status: Not on file  . Intimate partner violence:    Fear of current or ex partner: Not on file    Emotionally abused: Not on file    Physically abused: Not on file    Forced sexual activity: Not on file  Other Topics Concern  . Not on file  Social History Narrative   Lives in [place] alone. No pets. Retired from [employer].   Diet: healthy   Exercise: water aerobics twice weekly, balance class   Ambulates with a walker at baseline    Allergies: Allergies  Allergen Reactions  . Oxycodone     Other reaction(s): Confusion Patient's daughter reported confusion/sensitivity to oxycodone.     Current Medications: Current Outpatient Medications  Medication Sig Dispense Refill  . aspirin EC 81 MG  tablet Take 81 mg by mouth daily.    . ferrous sulfate 325 (65 FE) MG EC tablet Take 1 tablet (325 mg total) by mouth daily with breakfast. 90 tablet 1  . furosemide (LASIX) 20 MG tablet Take 1 tablet (20 mg total) by mouth daily. 30 tablet 2  . glucose blood (ONE TOUCH ULTRA TEST) test strip Use as instructed to check Blood sugar three times a day.  E11.65 Dispense patient choice. 300 each 3  . ibuprofen (ADVIL,MOTRIN) 200 MG tablet Take 200 mg by mouth every 6 (six) hours as needed.    . Insulin Glargine (LANTUS SOLOSTAR) 100 UNIT/ML Solostar Pen INJECT  17 UNITS SUBCUTANEOUSLY IN THE MORNING 15 mL 2  . Insulin Pen Needle (PEN NEEDLES) 32G X 4 MM MISC Inject 1 Dose into the skin daily. 90 each 3  . levothyroxine (SYNTHROID, LEVOTHROID) 75 MCG tablet Take 1 tablet (75 mcg total) by mouth daily. 90 tablet 3  . losartan (COZAAR) 50 MG tablet Take 50 mg by mouth daily.    . metFORMIN (GLUCOPHAGE) 1000 MG tablet Take 1 tablet (1,000 mg total) by mouth 2 (two) times daily with a meal. TAKE 1 TABLET TWICE DAILY  WITH  A  MEAL 180 tablet 3  . metoprolol succinate (TOPROL-XL) 25 MG 24 hr tablet Take 1 tablet (25 mg total) by mouth daily. 90 tablet 1  . omeprazole (PRILOSEC) 20 MG capsule Take 1 capsule (20 mg total) by mouth daily. 90 capsule 3  . polyethylene glycol (MIRALAX / GLYCOLAX) packet Take 17 g by mouth daily as needed.     . potassium chloride (K-DUR) 10 MEQ tablet TAKE 1 TABLET BY MOUTH EVERY DAY 30 tablet 1  . simvastatin (ZOCOR) 40 MG tablet Take 1 tablet (40 mg total) by mouth every evening. 90 tablet 2  . magnesium oxide (MAG-OX) 400 MG tablet Take 400 mg by mouth daily.    . ondansetron (ZOFRAN ODT) 4 MG disintegrating tablet Take 1 tablet (4 mg total) by mouth every 8 (eight) hours as needed for nausea or vomiting. (Patient not taking: Reported on 10/02/2018) 20 tablet 0   No current facility-administered medications for this visit.     Review of Systems General:  Fatigue,  weakness Skin: no complaints Eyes: no complaints HEENT: no complaints Pulmonary: no complaints Cardiac: no complaints Gastrointestinal: decreased appetite Genitourinary/Sexual: no complaints Ob/Gyn: no complaints Musculoskeletal: no complaints Hematology: no  complaints Neurologic/Psych: no complaints   Objective:  Physical Examination:  BP 114/80   Pulse 92   Temp 97.7 F (36.5 C) (Oral)   Resp 18   Ht 5\' 1"  (1.549 m)   Wt 206 lb 14.4 oz (93.8 kg)   BMI 39.09 kg/m    ECOG Performance Status: 2 - Symptomatic, <50% confined to bed  GENERAL: elderly female. Unaccompanied. No acute distress.  HEENT:  Sclerae anicteric. Neck supple. Hearing aids NODES:  No cervical, supraclavicular, axillary, or inguinal lymphadenopathy palpated.  LUNGS:  Clear to auscultation bilaterally.  No wheezes or rhonchi. HEART:  Regular rate and rhythm. No murmur appreciated. ABDOMEN:  Soft, nontender.  Positive, normoactive bowel sounds.  EXTREMITIES:  No peripheral edema.   SKIN:  Clear with no obvious rashes or skin changes. No nail dyscrasia. NEURO:  Nonfocal. Well oriented.  Appropriate affect.  Pelvic: Exam Chaperoned by NP EGBUS: no lesions Cervix: absent Vagina: no lesions, no discharge or bleeding; vaginal cuff well healed Uterus: absent Adnexa: no palpable masses Rectovaginal: deferred; large stool burden   CT scan 07/05/2018      Assessment:  ELANTRA CAPRARA is a 85 y.o. female diagnosed with possible ovarian teratoma with elevated CA125 and HE4 which may be falsely elevated given CHF and AKI. The calculation demonstrates high risk of malignancy of 47% but may not be reliable in her situation. She is a high risk surgical candidate. Follow up ultrasound and CA125 is stable and she is asymptomatic.    Medical co-morbidities complicating care: chronic diastolic heart failure (HFpEF) heart failure with preserved ejection fraction); h/o AKI, respiratory failure history, CAD, morbid  obesity, TIA (transient ischemic attack), diabetes, HTN, hyperlipidemia, hypothyroidism, limited ambulation (uses a walker).   Plan:   Problem List Items Addressed This Visit      Endocrine   Teratoma of left ovary - Primary     Follow up in 6 months with repeat ultrasound and CA125. No able to obtain HE4 today but other findings are all stable which is reassuring.   Suggested return to clinic in  ~ 6 months.    The patient's diagnosis, an outline of the further diagnostic and laboratory studies which will be required, the recommendation, and alternatives were discussed.  All questions were answered to the patient's satisfaction.  A total of  80 minutes were spent with the patient/family today; 50% was spent in education, counseling and coordination of care for pelvic mass.    Verlon Au, NP  I personally had a face to face interaction and evaluated the patient jointly with the NP, Ms. Beckey Rutter.  I have reviewed her history and available records and have performed the key portions of the physical exam including general, HEENT, abdominal exam, pelvic exam with my findings confirming those documented above by the APP.  I have discussed the case with the APP and the patient.  I agree with the above documentation, assessment and plan which was fully formulated by me.  Counseling was completed by me.   I personally saw the patient and performed a substantive portion of this encounter in conjunction with the listed APP as documented above.  Angeles Gaetana Michaelis, MD    CC:  Harlin Heys, MD

## 2018-10-02 NOTE — Progress Notes (Signed)
Pt not having gyn concerns. She is weak from being in hospital and going to rehab afterwards.she has walker and lives in [facility] living

## 2018-10-03 LAB — HUMAN EPIDIDYMIS PROT 4,SERIAL: HE4: 100 pmol/L — ABNORMAL HIGH (ref 0.0–96.9)

## 2018-10-15 ENCOUNTER — Ambulatory Visit (HOSPITAL_COMMUNITY): Payer: PPO

## 2018-10-15 DIAGNOSIS — Z95810 Presence of automatic (implantable) cardiac defibrillator: Secondary | ICD-10-CM | POA: Diagnosis not present

## 2018-10-15 DIAGNOSIS — Z807 Family history of other malignant neoplasms of lymphoid, hematopoietic and related tissues: Secondary | ICD-10-CM | POA: Diagnosis not present

## 2018-10-15 DIAGNOSIS — Z885 Allergy status to narcotic agent status: Secondary | ICD-10-CM

## 2018-10-15 DIAGNOSIS — J9601 Acute respiratory failure with hypoxia: Secondary | ICD-10-CM | POA: Diagnosis not present

## 2018-10-15 DIAGNOSIS — Z79899 Other long term (current) drug therapy: Secondary | ICD-10-CM

## 2018-10-15 DIAGNOSIS — J988 Other specified respiratory disorders: Secondary | ICD-10-CM | POA: Diagnosis not present

## 2018-10-15 DIAGNOSIS — J9801 Acute bronchospasm: Secondary | ICD-10-CM | POA: Diagnosis not present

## 2018-10-15 DIAGNOSIS — Z833 Family history of diabetes mellitus: Secondary | ICD-10-CM | POA: Diagnosis not present

## 2018-10-15 DIAGNOSIS — Z8673 Personal history of transient ischemic attack (TIA), and cerebral infarction without residual deficits: Secondary | ICD-10-CM | POA: Diagnosis not present

## 2018-10-15 DIAGNOSIS — R000 Tachycardia, unspecified: Secondary | ICD-10-CM | POA: Diagnosis not present

## 2018-10-15 DIAGNOSIS — J984 Other disorders of lung: Secondary | ICD-10-CM | POA: Insufficient documentation

## 2018-10-15 DIAGNOSIS — Z6839 Body mass index (BMI) 39.0-39.9, adult: Secondary | ICD-10-CM

## 2018-10-15 DIAGNOSIS — I4891 Unspecified atrial fibrillation: Secondary | ICD-10-CM | POA: Diagnosis not present

## 2018-10-15 DIAGNOSIS — I2510 Atherosclerotic heart disease of native coronary artery without angina pectoris: Secondary | ICD-10-CM | POA: Diagnosis not present

## 2018-10-15 DIAGNOSIS — E785 Hyperlipidemia, unspecified: Secondary | ICD-10-CM | POA: Diagnosis present

## 2018-10-15 DIAGNOSIS — J449 Chronic obstructive pulmonary disease, unspecified: Secondary | ICD-10-CM | POA: Diagnosis not present

## 2018-10-15 DIAGNOSIS — R0602 Shortness of breath: Secondary | ICD-10-CM | POA: Diagnosis not present

## 2018-10-15 DIAGNOSIS — I130 Hypertensive heart and chronic kidney disease with heart failure and stage 1 through stage 4 chronic kidney disease, or unspecified chronic kidney disease: Secondary | ICD-10-CM | POA: Diagnosis present

## 2018-10-15 DIAGNOSIS — I498 Other specified cardiac arrhythmias: Secondary | ICD-10-CM | POA: Diagnosis present

## 2018-10-15 DIAGNOSIS — R4781 Slurred speech: Secondary | ICD-10-CM | POA: Diagnosis present

## 2018-10-15 DIAGNOSIS — I5033 Acute on chronic diastolic (congestive) heart failure: Secondary | ICD-10-CM | POA: Diagnosis present

## 2018-10-15 DIAGNOSIS — Z90710 Acquired absence of both cervix and uterus: Secondary | ICD-10-CM

## 2018-10-15 DIAGNOSIS — I959 Hypotension, unspecified: Secondary | ICD-10-CM | POA: Diagnosis not present

## 2018-10-15 DIAGNOSIS — E876 Hypokalemia: Secondary | ICD-10-CM | POA: Diagnosis not present

## 2018-10-15 DIAGNOSIS — N183 Chronic kidney disease, stage 3 (moderate): Secondary | ICD-10-CM | POA: Diagnosis present

## 2018-10-15 DIAGNOSIS — T502X5A Adverse effect of carbonic-anhydrase inhibitors, benzothiadiazides and other diuretics, initial encounter: Secondary | ICD-10-CM | POA: Diagnosis not present

## 2018-10-15 DIAGNOSIS — N390 Urinary tract infection, site not specified: Secondary | ICD-10-CM | POA: Diagnosis present

## 2018-10-15 DIAGNOSIS — J810 Acute pulmonary edema: Secondary | ICD-10-CM | POA: Diagnosis not present

## 2018-10-15 DIAGNOSIS — Z7982 Long term (current) use of aspirin: Secondary | ICD-10-CM | POA: Diagnosis not present

## 2018-10-15 DIAGNOSIS — Z79890 Hormone replacement therapy (postmenopausal): Secondary | ICD-10-CM

## 2018-10-15 DIAGNOSIS — Z741 Need for assistance with personal care: Secondary | ICD-10-CM | POA: Diagnosis not present

## 2018-10-15 DIAGNOSIS — K219 Gastro-esophageal reflux disease without esophagitis: Secondary | ICD-10-CM | POA: Diagnosis not present

## 2018-10-15 DIAGNOSIS — Z8051 Family history of malignant neoplasm of kidney: Secondary | ICD-10-CM | POA: Diagnosis not present

## 2018-10-15 DIAGNOSIS — Z800 Family history of malignant neoplasm of digestive organs: Secondary | ICD-10-CM

## 2018-10-15 DIAGNOSIS — E538 Deficiency of other specified B group vitamins: Secondary | ICD-10-CM | POA: Diagnosis present

## 2018-10-15 DIAGNOSIS — I272 Pulmonary hypertension, unspecified: Secondary | ICD-10-CM

## 2018-10-15 DIAGNOSIS — W19XXXA Unspecified fall, initial encounter: Secondary | ICD-10-CM | POA: Diagnosis not present

## 2018-10-15 DIAGNOSIS — I4892 Unspecified atrial flutter: Secondary | ICD-10-CM | POA: Diagnosis present

## 2018-10-15 DIAGNOSIS — I495 Sick sinus syndrome: Secondary | ICD-10-CM | POA: Diagnosis not present

## 2018-10-15 DIAGNOSIS — Z8249 Family history of ischemic heart disease and other diseases of the circulatory system: Secondary | ICD-10-CM | POA: Diagnosis not present

## 2018-10-15 DIAGNOSIS — Z791 Long term (current) use of non-steroidal anti-inflammatories (NSAID): Secondary | ICD-10-CM | POA: Diagnosis not present

## 2018-10-15 DIAGNOSIS — H9190 Unspecified hearing loss, unspecified ear: Secondary | ICD-10-CM | POA: Diagnosis present

## 2018-10-15 DIAGNOSIS — Z8041 Family history of malignant neoplasm of ovary: Secondary | ICD-10-CM

## 2018-10-15 DIAGNOSIS — X58XXXA Exposure to other specified factors, initial encounter: Secondary | ICD-10-CM | POA: Diagnosis not present

## 2018-10-15 DIAGNOSIS — J069 Acute upper respiratory infection, unspecified: Secondary | ICD-10-CM | POA: Diagnosis present

## 2018-10-15 DIAGNOSIS — R7989 Other specified abnormal findings of blood chemistry: Secondary | ICD-10-CM | POA: Diagnosis not present

## 2018-10-15 DIAGNOSIS — I10 Essential (primary) hypertension: Secondary | ICD-10-CM | POA: Diagnosis not present

## 2018-10-15 DIAGNOSIS — R42 Dizziness and giddiness: Secondary | ICD-10-CM | POA: Diagnosis not present

## 2018-10-15 DIAGNOSIS — I340 Nonrheumatic mitral (valve) insufficiency: Secondary | ICD-10-CM | POA: Diagnosis not present

## 2018-10-15 DIAGNOSIS — H8110 Benign paroxysmal vertigo, unspecified ear: Secondary | ICD-10-CM | POA: Diagnosis not present

## 2018-10-15 DIAGNOSIS — I5032 Chronic diastolic (congestive) heart failure: Secondary | ICD-10-CM | POA: Diagnosis not present

## 2018-10-15 DIAGNOSIS — E118 Type 2 diabetes mellitus with unspecified complications: Secondary | ICD-10-CM | POA: Diagnosis not present

## 2018-10-15 DIAGNOSIS — I483 Typical atrial flutter: Secondary | ICD-10-CM | POA: Diagnosis not present

## 2018-10-15 DIAGNOSIS — D649 Anemia, unspecified: Secondary | ICD-10-CM | POA: Diagnosis present

## 2018-10-15 DIAGNOSIS — R2689 Other abnormalities of gait and mobility: Secondary | ICD-10-CM | POA: Diagnosis present

## 2018-10-15 DIAGNOSIS — E119 Type 2 diabetes mellitus without complications: Secondary | ICD-10-CM | POA: Diagnosis not present

## 2018-10-15 DIAGNOSIS — M6281 Muscle weakness (generalized): Secondary | ICD-10-CM | POA: Diagnosis not present

## 2018-10-15 DIAGNOSIS — I471 Supraventricular tachycardia: Secondary | ICD-10-CM | POA: Diagnosis not present

## 2018-10-15 DIAGNOSIS — M2550 Pain in unspecified joint: Secondary | ICD-10-CM | POA: Diagnosis not present

## 2018-10-15 DIAGNOSIS — K5900 Constipation, unspecified: Secondary | ICD-10-CM | POA: Diagnosis not present

## 2018-10-15 DIAGNOSIS — I110 Hypertensive heart disease with heart failure: Secondary | ICD-10-CM | POA: Diagnosis not present

## 2018-10-15 DIAGNOSIS — I484 Atypical atrial flutter: Secondary | ICD-10-CM | POA: Diagnosis not present

## 2018-10-15 DIAGNOSIS — E039 Hypothyroidism, unspecified: Secondary | ICD-10-CM | POA: Diagnosis present

## 2018-10-15 DIAGNOSIS — E669 Obesity, unspecified: Secondary | ICD-10-CM | POA: Diagnosis present

## 2018-10-15 DIAGNOSIS — Z7401 Bed confinement status: Secondary | ICD-10-CM | POA: Diagnosis not present

## 2018-10-15 DIAGNOSIS — G1220 Motor neuron disease, unspecified: Secondary | ICD-10-CM | POA: Diagnosis not present

## 2018-10-15 DIAGNOSIS — Z794 Long term (current) use of insulin: Secondary | ICD-10-CM

## 2018-10-15 DIAGNOSIS — R278 Other lack of coordination: Secondary | ICD-10-CM | POA: Diagnosis not present

## 2018-10-15 DIAGNOSIS — R0902 Hypoxemia: Secondary | ICD-10-CM | POA: Diagnosis not present

## 2018-10-17 ENCOUNTER — Ambulatory Visit (INDEPENDENT_AMBULATORY_CARE_PROVIDER_SITE_OTHER): Payer: PPO | Admitting: Family Medicine

## 2018-10-17 ENCOUNTER — Telehealth: Payer: Self-pay

## 2018-10-17 ENCOUNTER — Encounter: Payer: Self-pay | Admitting: Family Medicine

## 2018-10-17 ENCOUNTER — Encounter: Payer: Self-pay | Admitting: Emergency Medicine

## 2018-10-17 ENCOUNTER — Emergency Department: Payer: PPO

## 2018-10-17 ENCOUNTER — Other Ambulatory Visit: Payer: Self-pay

## 2018-10-17 ENCOUNTER — Inpatient Hospital Stay
Admission: EM | Admit: 2018-10-17 | Discharge: 2018-10-28 | DRG: 308 | Disposition: A | Discharge status: Skilled Nursing Facility | Payer: PPO | Attending: Specialist | Admitting: Specialist

## 2018-10-17 VITALS — BP 122/88 | HR 117 | Temp 97.4°F | Resp 18 | Ht 61.0 in

## 2018-10-17 DIAGNOSIS — E1122 Type 2 diabetes mellitus with diabetic chronic kidney disease: Secondary | ICD-10-CM

## 2018-10-17 DIAGNOSIS — Z7982 Long term (current) use of aspirin: Secondary | ICD-10-CM | POA: Diagnosis not present

## 2018-10-17 DIAGNOSIS — R42 Dizziness and giddiness: Secondary | ICD-10-CM

## 2018-10-17 DIAGNOSIS — I130 Hypertensive heart and chronic kidney disease with heart failure and stage 1 through stage 4 chronic kidney disease, or unspecified chronic kidney disease: Secondary | ICD-10-CM | POA: Diagnosis present

## 2018-10-17 DIAGNOSIS — I483 Typical atrial flutter: Secondary | ICD-10-CM

## 2018-10-17 DIAGNOSIS — J988 Other specified respiratory disorders: Secondary | ICD-10-CM | POA: Diagnosis not present

## 2018-10-17 DIAGNOSIS — Z807 Family history of other malignant neoplasms of lymphoid, hematopoietic and related tissues: Secondary | ICD-10-CM | POA: Diagnosis not present

## 2018-10-17 DIAGNOSIS — E876 Hypokalemia: Secondary | ICD-10-CM | POA: Diagnosis not present

## 2018-10-17 DIAGNOSIS — Z791 Long term (current) use of non-steroidal anti-inflammatories (NSAID): Secondary | ICD-10-CM | POA: Diagnosis not present

## 2018-10-17 DIAGNOSIS — Z8249 Family history of ischemic heart disease and other diseases of the circulatory system: Secondary | ICD-10-CM | POA: Diagnosis not present

## 2018-10-17 DIAGNOSIS — Z8041 Family history of malignant neoplasm of ovary: Secondary | ICD-10-CM | POA: Diagnosis not present

## 2018-10-17 DIAGNOSIS — E785 Hyperlipidemia, unspecified: Secondary | ICD-10-CM | POA: Diagnosis present

## 2018-10-17 DIAGNOSIS — N390 Urinary tract infection, site not specified: Secondary | ICD-10-CM | POA: Diagnosis present

## 2018-10-17 DIAGNOSIS — R0602 Shortness of breath: Secondary | ICD-10-CM

## 2018-10-17 DIAGNOSIS — I4892 Unspecified atrial flutter: Secondary | ICD-10-CM | POA: Diagnosis present

## 2018-10-17 DIAGNOSIS — I484 Atypical atrial flutter: Secondary | ICD-10-CM | POA: Diagnosis not present

## 2018-10-17 DIAGNOSIS — I10 Essential (primary) hypertension: Secondary | ICD-10-CM

## 2018-10-17 DIAGNOSIS — R7989 Other specified abnormal findings of blood chemistry: Secondary | ICD-10-CM

## 2018-10-17 DIAGNOSIS — R000 Tachycardia, unspecified: Secondary | ICD-10-CM

## 2018-10-17 DIAGNOSIS — E118 Type 2 diabetes mellitus with unspecified complications: Secondary | ICD-10-CM | POA: Diagnosis not present

## 2018-10-17 DIAGNOSIS — Z8051 Family history of malignant neoplasm of kidney: Secondary | ICD-10-CM | POA: Diagnosis not present

## 2018-10-17 DIAGNOSIS — Z8673 Personal history of transient ischemic attack (TIA), and cerebral infarction without residual deficits: Secondary | ICD-10-CM | POA: Diagnosis not present

## 2018-10-17 DIAGNOSIS — Z794 Long term (current) use of insulin: Secondary | ICD-10-CM

## 2018-10-17 DIAGNOSIS — J449 Chronic obstructive pulmonary disease, unspecified: Secondary | ICD-10-CM | POA: Diagnosis not present

## 2018-10-17 DIAGNOSIS — I2510 Atherosclerotic heart disease of native coronary artery without angina pectoris: Secondary | ICD-10-CM

## 2018-10-17 DIAGNOSIS — I272 Pulmonary hypertension, unspecified: Secondary | ICD-10-CM | POA: Diagnosis present

## 2018-10-17 DIAGNOSIS — I5033 Acute on chronic diastolic (congestive) heart failure: Secondary | ICD-10-CM | POA: Diagnosis present

## 2018-10-17 DIAGNOSIS — T502X5A Adverse effect of carbonic-anhydrase inhibitors, benzothiadiazides and other diuretics, initial encounter: Secondary | ICD-10-CM | POA: Diagnosis not present

## 2018-10-17 DIAGNOSIS — I340 Nonrheumatic mitral (valve) insufficiency: Secondary | ICD-10-CM | POA: Diagnosis not present

## 2018-10-17 DIAGNOSIS — Z90710 Acquired absence of both cervix and uterus: Secondary | ICD-10-CM | POA: Diagnosis not present

## 2018-10-17 DIAGNOSIS — J9801 Acute bronchospasm: Secondary | ICD-10-CM | POA: Diagnosis not present

## 2018-10-17 DIAGNOSIS — I498 Other specified cardiac arrhythmias: Secondary | ICD-10-CM

## 2018-10-17 DIAGNOSIS — Z800 Family history of malignant neoplasm of digestive organs: Secondary | ICD-10-CM | POA: Diagnosis not present

## 2018-10-17 DIAGNOSIS — J810 Acute pulmonary edema: Secondary | ICD-10-CM | POA: Diagnosis not present

## 2018-10-17 DIAGNOSIS — E538 Deficiency of other specified B group vitamins: Secondary | ICD-10-CM | POA: Diagnosis present

## 2018-10-17 DIAGNOSIS — N183 Chronic kidney disease, stage 3 (moderate): Secondary | ICD-10-CM

## 2018-10-17 DIAGNOSIS — Z885 Allergy status to narcotic agent status: Secondary | ICD-10-CM | POA: Diagnosis not present

## 2018-10-17 DIAGNOSIS — X58XXXA Exposure to other specified factors, initial encounter: Secondary | ICD-10-CM | POA: Diagnosis not present

## 2018-10-17 DIAGNOSIS — E039 Hypothyroidism, unspecified: Secondary | ICD-10-CM | POA: Diagnosis present

## 2018-10-17 DIAGNOSIS — I5032 Chronic diastolic (congestive) heart failure: Secondary | ICD-10-CM | POA: Diagnosis not present

## 2018-10-17 DIAGNOSIS — R4781 Slurred speech: Secondary | ICD-10-CM | POA: Diagnosis present

## 2018-10-17 DIAGNOSIS — Z833 Family history of diabetes mellitus: Secondary | ICD-10-CM | POA: Diagnosis not present

## 2018-10-17 LAB — CBC
HCT: 41.1 % (ref 36.0–46.0)
HEMOGLOBIN: 12.8 g/dL (ref 12.0–15.0)
MCH: 26.4 pg (ref 26.0–34.0)
MCHC: 31.1 g/dL (ref 30.0–36.0)
MCV: 84.7 fL (ref 80.0–100.0)
Platelets: 266 10*3/uL (ref 150–400)
RBC: 4.85 MIL/uL (ref 3.87–5.11)
RDW: 18.6 % — ABNORMAL HIGH (ref 11.5–15.5)
WBC: 10.5 10*3/uL (ref 4.0–10.5)
nRBC: 0 % (ref 0.0–0.2)

## 2018-10-17 LAB — BASIC METABOLIC PANEL
Anion gap: 9 (ref 5–15)
BUN: 25 mg/dL — ABNORMAL HIGH (ref 8–23)
CO2: 24 mmol/L (ref 22–32)
Calcium: 8.8 mg/dL — ABNORMAL LOW (ref 8.9–10.3)
Chloride: 103 mmol/L (ref 98–111)
Creatinine, Ser: 1.15 mg/dL — ABNORMAL HIGH (ref 0.44–1.00)
GFR calc Af Amer: 51 mL/min — ABNORMAL LOW (ref >60)
GFR calc non Af Amer: 44 mL/min — ABNORMAL LOW (ref >60)
GLUCOSE: 167 mg/dL — AB (ref 70–99)
Potassium: 4.7 mmol/L (ref 3.5–5.1)
Sodium: 136 mmol/L (ref 135–145)

## 2018-10-17 LAB — PROTIME-INR
INR: 0.87
Prothrombin Time: 11.8 seconds (ref 11.4–15.2)

## 2018-10-17 LAB — URINALYSIS, COMPLETE (UACMP) WITH MICROSCOPIC
Bilirubin Urine: NEGATIVE
Glucose, UA: NEGATIVE mg/dL
Hgb urine dipstick: NEGATIVE
Ketones, ur: NEGATIVE mg/dL
Nitrite: NEGATIVE
Protein, ur: NEGATIVE mg/dL
Specific Gravity, Urine: 1.006 (ref 1.005–1.030)
pH: 5 (ref 5.0–8.0)

## 2018-10-17 LAB — HEMOGLOBIN A1C
Hgb A1c MFr Bld: 9.6 % — ABNORMAL HIGH (ref 4.8–5.6)
MEAN PLASMA GLUCOSE: 228.82 mg/dL

## 2018-10-17 LAB — APTT: aPTT: 24 seconds (ref 24–36)

## 2018-10-17 LAB — TSH: TSH: 1.862 u[IU]/mL (ref 0.350–4.500)

## 2018-10-17 LAB — TROPONIN I
Troponin I: 0.05 ng/mL (ref <0.03)
Troponin I: 0.05 ng/mL (ref <0.03)
Troponin I: 0.05 ng/mL (ref <0.03)

## 2018-10-17 LAB — GLUCOSE, CAPILLARY
Glucose-Capillary: 123 mg/dL — ABNORMAL HIGH (ref 70–99)
Glucose-Capillary: 173 mg/dL — ABNORMAL HIGH (ref 70–99)

## 2018-10-17 LAB — BRAIN NATRIURETIC PEPTIDE: B Natriuretic Peptide: 728 pg/mL — ABNORMAL HIGH (ref 0.0–100.0)

## 2018-10-17 MED ORDER — ONDANSETRON HCL 4 MG PO TABS
4.0000 mg | ORAL_TABLET | Freq: Four times a day (QID) | ORAL | Status: DC | PRN
  Administered 2018-10-20: 4 mg via ORAL
  Filled 2018-10-17: qty 1

## 2018-10-17 MED ORDER — ASPIRIN EC 81 MG PO TBEC
81.0000 mg | DELAYED_RELEASE_TABLET | Freq: Every day | ORAL | Status: DC
  Administered 2018-10-18 – 2018-10-20 (×3): 81 mg via ORAL
  Filled 2018-10-17 (×3): qty 1

## 2018-10-17 MED ORDER — METOPROLOL SUCCINATE ER 50 MG PO TB24: 25.0000 mg | ORAL_TABLET | Freq: Every day | ORAL | Status: DC

## 2018-10-17 MED ORDER — POLYETHYLENE GLYCOL 3350 17 G PO PACK
17.0000 g | PACK | Freq: Every day | ORAL | Status: DC | PRN
  Administered 2018-10-27: 17 g via ORAL
  Filled 2018-10-17: qty 1

## 2018-10-17 MED ORDER — FUROSEMIDE 40 MG PO TABS
20.0000 mg | ORAL_TABLET | Freq: Every day | ORAL | Status: DC
  Filled 2018-10-17: qty 1

## 2018-10-17 MED ORDER — METFORMIN HCL 500 MG PO TABS
1000.0000 mg | ORAL_TABLET | Freq: Two times a day (BID) | ORAL | Status: DC
  Administered 2018-10-17 – 2018-10-28 (×19): 1000 mg via ORAL
  Filled 2018-10-17 (×19): qty 2

## 2018-10-17 MED ORDER — ACETAMINOPHEN 325 MG PO TABS
650.0000 mg | ORAL_TABLET | Freq: Four times a day (QID) | ORAL | Status: DC | PRN
  Administered 2018-10-18: 650 mg via ORAL
  Filled 2018-10-17: qty 2

## 2018-10-17 MED ORDER — INSULIN ASPART 100 UNIT/ML ~~LOC~~ SOLN
0.0000 [IU] | Freq: Three times a day (TID) | SUBCUTANEOUS | Status: DC
  Administered 2018-10-17 – 2018-10-18 (×3): 1 [IU] via SUBCUTANEOUS
  Administered 2018-10-18: 2 [IU] via SUBCUTANEOUS
  Administered 2018-10-19 – 2018-10-20 (×2): 1 [IU] via SUBCUTANEOUS
  Administered 2018-10-20: 3 [IU] via SUBCUTANEOUS
  Administered 2018-10-21 – 2018-10-23 (×6): 2 [IU] via SUBCUTANEOUS
  Administered 2018-10-23: 3 [IU] via SUBCUTANEOUS
  Administered 2018-10-23 – 2018-10-24 (×2): 1 [IU] via SUBCUTANEOUS
  Administered 2018-10-24: 2 [IU] via SUBCUTANEOUS
  Administered 2018-10-24: 3 [IU] via SUBCUTANEOUS
  Administered 2018-10-25 (×2): 5 [IU] via SUBCUTANEOUS
  Administered 2018-10-25: 2 [IU] via SUBCUTANEOUS
  Administered 2018-10-26: 3 [IU] via SUBCUTANEOUS
  Administered 2018-10-26 (×3): 2 [IU] via SUBCUTANEOUS
  Administered 2018-10-27: 5 [IU] via SUBCUTANEOUS
  Administered 2018-10-28: 2 [IU] via SUBCUTANEOUS
  Administered 2018-10-28: 1 [IU] via SUBCUTANEOUS
  Filled 2018-10-17 (×27): qty 1

## 2018-10-17 MED ORDER — HEPARIN (PORCINE) 25000 UT/250ML-% IV SOLN
1000.0000 [IU]/h | INTRAVENOUS | Status: DC
  Administered 2018-10-17: 850 [IU]/h via INTRAVENOUS
  Administered 2018-10-18: 1000 [IU]/h via INTRAVENOUS
  Filled 2018-10-17 (×2): qty 250

## 2018-10-17 MED ORDER — FERROUS SULFATE 325 (65 FE) MG PO TABS
325.0000 mg | ORAL_TABLET | Freq: Every day | ORAL | Status: DC
  Administered 2018-10-18 – 2018-10-28 (×11): 325 mg via ORAL
  Filled 2018-10-17 (×11): qty 1

## 2018-10-17 MED ORDER — DILTIAZEM HCL-DEXTROSE 100-5 MG/100ML-% IV SOLN (PREMIX)
5.0000 mg/h | INTRAVENOUS | Status: DC
  Administered 2018-10-17: 9.5 mg/h via INTRAVENOUS
  Administered 2018-10-17: 11.5 mg/h via INTRAVENOUS
  Administered 2018-10-17: 15 mg/h via INTRAVENOUS
  Administered 2018-10-17: 5 mg/h via INTRAVENOUS
  Administered 2018-10-17: 15 mg/h via INTRAVENOUS
  Administered 2018-10-18: 7.5 mg/h via INTRAVENOUS
  Administered 2018-10-18: 12.5 mg/h via INTRAVENOUS
  Administered 2018-10-18: 10 mg/h via INTRAVENOUS
  Administered 2018-10-18: 5 mg/h via INTRAVENOUS
  Filled 2018-10-17 (×3): qty 100

## 2018-10-17 MED ORDER — POTASSIUM CHLORIDE CRYS ER 10 MEQ PO TBCR
10.0000 meq | EXTENDED_RELEASE_TABLET | Freq: Every day | ORAL | Status: DC
  Administered 2018-10-18 – 2018-10-28 (×11): 10 meq via ORAL
  Filled 2018-10-17 (×19): qty 1

## 2018-10-17 MED ORDER — INSULIN GLARGINE 100 UNIT/ML ~~LOC~~ SOLN
17.0000 [IU] | Freq: Every day | SUBCUTANEOUS | Status: DC
  Administered 2018-10-18 – 2018-10-28 (×11): 17 [IU] via SUBCUTANEOUS
  Filled 2018-10-17 (×12): qty 0.17

## 2018-10-17 MED ORDER — ONDANSETRON HCL 4 MG/2ML IJ SOLN
4.0000 mg | Freq: Four times a day (QID) | INTRAMUSCULAR | Status: DC | PRN
  Administered 2018-10-18: 4 mg via INTRAVENOUS

## 2018-10-17 MED ORDER — LOSARTAN POTASSIUM 50 MG PO TABS: 50.0000 mg | ORAL_TABLET | Freq: Every day | ORAL | Status: DC

## 2018-10-17 MED ORDER — INSULIN ASPART 100 UNIT/ML ~~LOC~~ SOLN
0.0000 [IU] | Freq: Every day | SUBCUTANEOUS | Status: DC
  Administered 2018-10-20: 2 [IU] via SUBCUTANEOUS
  Administered 2018-10-22: 3 [IU] via SUBCUTANEOUS
  Administered 2018-10-23 – 2018-10-25 (×2): 2 [IU] via SUBCUTANEOUS
  Administered 2018-10-26: 4 [IU] via SUBCUTANEOUS
  Filled 2018-10-17 (×5): qty 1

## 2018-10-17 MED ORDER — SIMVASTATIN 10 MG PO TABS
40.0000 mg | ORAL_TABLET | Freq: Every evening | ORAL | Status: DC
  Administered 2018-10-17 – 2018-10-27 (×11): 40 mg via ORAL
  Filled 2018-10-17 (×12): qty 4

## 2018-10-17 MED ORDER — HEPARIN BOLUS VIA INFUSION
4000.0000 [IU] | Freq: Once | INTRAVENOUS | Status: AC
  Administered 2018-10-17: 4000 [IU] via INTRAVENOUS
  Filled 2018-10-17: qty 4000

## 2018-10-17 MED ORDER — DILTIAZEM HCL 25 MG/5ML IV SOLN
10.0000 mg | Freq: Once | INTRAVENOUS | Status: AC
  Administered 2018-10-17: 10 mg via INTRAVENOUS
  Filled 2018-10-17: qty 5

## 2018-10-17 MED ORDER — METOPROLOL SUCCINATE ER 25 MG PO TB24
25.0000 mg | ORAL_TABLET | Freq: Two times a day (BID) | ORAL | Status: DC
  Administered 2018-10-17 – 2018-10-20 (×6): 25 mg via ORAL
  Filled 2018-10-17 (×6): qty 1

## 2018-10-17 MED ORDER — ACETAMINOPHEN 650 MG RE SUPP: 650.0000 mg | Freq: Four times a day (QID) | RECTAL | Status: DC | PRN

## 2018-10-17 MED ORDER — PANTOPRAZOLE SODIUM 40 MG PO TBEC
40.0000 mg | DELAYED_RELEASE_TABLET | Freq: Every day | ORAL | Status: DC
  Administered 2018-10-18 – 2018-10-28 (×11): 40 mg via ORAL
  Filled 2018-10-17 (×11): qty 1

## 2018-10-17 MED ORDER — LEVOTHYROXINE SODIUM 50 MCG PO TABS
75.0000 ug | ORAL_TABLET | Freq: Every day | ORAL | Status: DC
  Administered 2018-10-18 – 2018-10-28 (×11): 75 ug via ORAL
  Filled 2018-10-17 (×11): qty 2

## 2018-10-17 NOTE — Progress Notes (Signed)
Dr. Rockey Situ aware of uncontrolled HR / orders to increase cardizem gtt to 22ml / hr   will continue to monitor.

## 2018-10-17 NOTE — ED Notes (Signed)
Call to 2A 

## 2018-10-17 NOTE — Progress Notes (Signed)
Patients HR has been elevated since admission. MDs aware.

## 2018-10-17 NOTE — Progress Notes (Signed)
Patient BP 105/75. Patient due to receive Metoprolol  XL 25 mg tonight. MD [place] notified. Per MD, proceed with administration of medication. Will continue to monitor.  Iran Sizer M

## 2018-10-17 NOTE — Patient Instructions (Signed)
EKG shows HR 147  Patient refuses ambulance, states grandson will bring her to ER

## 2018-10-17 NOTE — Progress Notes (Addendum)
ANTICOAGULATION CONSULT NOTE - Initial Consult  Pharmacy Consult for Heparin drip Indication: atrial fibrillation  Allergies  Allergen Reactions  . Oxycodone     Other reaction(s): Confusion Patient's daughter reported confusion/sensitivity to oxycodone.     Patient Measurements: Height: 5\' 1"  (154.9 cm) Weight: 205 lb (93 kg) IBW/kg (Calculated) : 47.8 Heparin Dosing Weight: 69.7 kg  Vital Signs: Temp: 97.6 F (36.4 C) (01/30 1438) Temp Source: Oral (01/30 1438) BP: 130/70 (01/30 1700) Pulse Rate: 144 (01/30 1700)  Labs: Recent Labs    10/17/18 1440  HGB 12.8  HCT 41.1  PLT 266  CREATININE 1.15*  TROPONINI 0.05*    Estimated Creatinine Clearance: 37.9 mL/min (A) (by C-G formula based on SCr of 1.15 mg/dL (H)).   Medical History: Past Medical History:  Diagnosis Date  . Benign paroxysmal positional vertigo 07/17/2014  . Chronic diastolic CHF (congestive heart failure) ([place])    a. echo 10/19: EF of 60-65%, no RWMA, Gr2DD, calcified mitral annulus with mild MR, mildly to moderately dilated left atrium, moderate TR, PASP 52 mmHg  . Diabetes mellitus   . H/O: rheumatic fever   . Hyperlipidemia   . Hypertension   . Pulmonary hypertension ([place])   . Stroke [facility])    TIA Jan. 1st  . Teratoma of left ovary   . Thyroid disease     Medications:  Scheduled:  . aspirin EC  81 mg Oral Daily  . [START ON 10/18/2018] ferrous sulfate  325 mg Oral Q breakfast  . furosemide  20 mg Oral Daily  . heparin  4,000 Units Intravenous Once  . insulin aspart  0-5 Units Subcutaneous QHS  . insulin aspart  0-9 Units Subcutaneous TID WC  . Insulin Glargine  17 Units Subcutaneous Daily  . levothyroxine  75 mcg Oral Daily  . losartan  50 mg Oral Daily  . metFORMIN  1,000 mg Oral BID WC  . metoprolol succinate  25 mg Oral Daily  . pantoprazole  40 mg Oral Daily  . potassium chloride  10 mEq Oral Daily  . simvastatin  40 mg Oral QPM   Infusions:  . diltiazem (CARDIZEM) infusion  7.5 mg/hr (10/17/18 1700)  . heparin      Assessment: 85 yo F to start Heparin drip for new Afib. Patient on ASA at home. Hgb 12.8  Plt 266  INR 0.87  APTT 24   Goal of Therapy:  Heparin level 0.3-0.7 units/ml Monitor platelets by anticoagulation protocol: Yes   Plan:  Give 4000 units bolus x 1 Start heparin infusion at 850 units/hr Check anti-Xa level in 8 hours and daily while on heparin Continue to monitor H&H and platelets  Ciarah Peace A 10/17/2018,5:09 PM

## 2018-10-17 NOTE — ED Triage Notes (Signed)
PT was seen at PCP due to a week of feeling dizzy. An EKG was performed in the office with a HR in the 140s. Pt remains at a rate of 145 in triage. PT denies pain.

## 2018-10-17 NOTE — Consult Note (Addendum)
Cardiology Consultation:   Patient ID: Diane Mullins MRN: [id number]; DOB: Feb 09, 1933  Admit date: 10/17/2018 Date of Consult: 10/17/2018  Primary Care Provider: [name], MD Primary Cardiologist: [facility] Physician requesting consult: Dr. Tressia Miners Reason for consult: Atrial flutter with RVR   Patient Profile:   Diane Mullins is a 85 y.o. female with a hx of  diabetes,  obesity,  Hypertension, hyperlipidemia,  gait instability chronic leg weakness tachycardia  Heavy coronary calcification on CT scan who presents to the hospital with tachycardia, dizziness  History of Present Illness:   Diane Mullins reports that she was doing well until this past Monday 3 days ago.  During the daytime appreciated tachycardia, palpitations, more weakness and dizziness.  She thought it was her vertigo and did not do anything.  Denies any chest pain or shortness of breath.  Saw primary care today, heart rate 140s on EKG and was sent to the emergency room  She denies any recent stressors.  Otherwise reports she has been in her usual state of health  In the emergency room given Cardizem IV push with no improvement in her rate, started on Cardizem infusion  This evening eating dinner with no complaints Reports her main symptom this week has been some dizziness  UA positive   Past Medical History:  Diagnosis Date  . Benign paroxysmal positional vertigo 07/17/2014  . Chronic diastolic CHF (congestive heart failure) ([place])    a. echo 10/19: EF of 60-65%, no RWMA, Gr2DD, calcified mitral annulus with mild MR, mildly to moderately dilated left atrium, moderate TR, PASP 52 mmHg  . Diabetes mellitus   . H/O: rheumatic fever   . Hyperlipidemia   . Hypertension   . Pulmonary hypertension ([place])   . Stroke [facility])    TIA Jan. 1st  . Teratoma of left ovary   . Thyroid disease     Past Surgical History:  Procedure Laterality Date  . ABDOMINAL HYSTERECTOMY  1973   menorrhagia  . HERNIA  REPAIR  1994  . ORIF ANKLE FRACTURE Right 10/29/2015   Procedure: OPEN REDUCTION INTERNAL FIXATION (ORIF) ANKLE FRACTURE;  Surgeon: Thornton Park, MD;  Location: [facility] ORS;  Service: Orthopedics;  Laterality: Right;  Marland Kitchen VAGINAL DELIVERY     x4     Home Medications:  Prior to Admission medications   Medication Sig Start Date End Date Taking? Authorizing Provider  aspirin EC 81 MG tablet Take 81 mg by mouth daily.   Yes [provider]  ferrous sulfate 325 (65 FE) MG EC tablet Take 1 tablet (325 mg total) by mouth daily with breakfast. 08/30/18  Yes Guse, Jacquelynn Cree, FNP  furosemide (LASIX) 20 MG tablet Take 1 tablet (20 mg total) by mouth daily. 07/10/18  Yes Gladstone Lighter, MD  glucose blood (ONE TOUCH ULTRA TEST) test strip Use as instructed to check Blood sugar three times a day.  E11.65 Dispense patient choice. 07/30/18  Yes Guse, Jacquelynn Cree, FNP  ibuprofen (ADVIL,MOTRIN) 200 MG tablet Take 200 mg by mouth every 6 (six) hours as needed.   Yes [provider]  Insulin Glargine (LANTUS SOLOSTAR) 100 UNIT/ML Solostar Pen INJECT  17 UNITS SUBCUTANEOUSLY IN THE MORNING 08/27/18  Yes Guse, Jacquelynn Cree, FNP  Insulin Pen Needle (PEN NEEDLES) 32G X 4 MM MISC Inject 1 Dose into the skin daily. 12/19/16  Yes [name], MD  levothyroxine (SYNTHROID, LEVOTHROID) 75 MCG tablet Take 1 tablet (75 mcg total) by mouth daily. 04/09/18  Yes Tommi Rumps  G, MD  losartan (COZAAR) 50 MG tablet Take 50 mg by mouth daily.   Yes [provider]  metFORMIN (GLUCOPHAGE) 1000 MG tablet Take 1 tablet (1,000 mg total) by mouth 2 (two) times daily with a meal. TAKE 1 TABLET TWICE DAILY  WITH  A  MEAL 04/09/18  Yes [name], MD  metoprolol succinate (TOPROL-XL) 25 MG 24 hr tablet Take 1 tablet (25 mg total) by mouth daily. 08/27/18  Yes Guse, Jacquelynn Cree, FNP  omeprazole (PRILOSEC) 20 MG capsule Take 1 capsule (20 mg total) by mouth daily. 04/09/18  Yes [name], MD    polyethylene glycol [facility] / GLYCOLAX) packet Take 17 g by mouth daily as needed.    Yes [provider]  potassium chloride (K-DUR) 10 MEQ tablet TAKE 1 TABLET BY MOUTH EVERY DAY 09/12/18  Yes [name], MD  simvastatin (ZOCOR) 40 MG tablet Take 1 tablet (40 mg total) by mouth every evening. 04/09/18  Yes [name], MD    Inpatient Medications: Scheduled Meds: . [START ON 10/18/2018] aspirin EC  81 mg Oral Daily  . [START ON 10/18/2018] ferrous sulfate  325 mg Oral Q breakfast  . [START ON 10/18/2018] furosemide  20 mg Oral Daily  . insulin aspart  0-5 Units Subcutaneous QHS  . insulin aspart  0-9 Units Subcutaneous TID WC  . [START ON 10/18/2018] insulin glargine  17 Units Subcutaneous Daily  . [START ON 10/18/2018] levothyroxine  75 mcg Oral QAC breakfast  . [START ON 10/18/2018] losartan  50 mg Oral Daily  . metFORMIN  1,000 mg Oral BID WC  . [START ON 10/18/2018] metoprolol succinate  25 mg Oral Daily  . [START ON 10/18/2018] pantoprazole  40 mg Oral Daily  . [START ON 10/18/2018] potassium chloride  10 mEq Oral Daily  . simvastatin  40 mg Oral QPM   Continuous Infusions: . diltiazem (CARDIZEM) infusion 15 mg/hr (10/17/18 1833)  . heparin 850 Units/hr (10/17/18 1747)   PRN Meds: acetaminophen **OR** acetaminophen, ondansetron **OR** ondansetron (ZOFRAN) IV, polyethylene glycol  Allergies:    Allergies  Allergen Reactions  . Oxycodone     Other reaction(s): Confusion Patient's daughter reported confusion/sensitivity to oxycodone.     Social History:   Social History   Socioeconomic History  . Marital status: Widowed    Spouse name: Not on file  . Number of children: 4  . Years of education: Not on file  . Highest education level: Not on file  Occupational History  . Not on file  Social Needs  . Financial resource strain: Not hard at all  . Food insecurity:    Worry: Never true    Inability: Never true  . Transportation needs:    Medical:  No    Non-medical: No  Tobacco Use  . Smoking status: Never Smoker  . Smokeless tobacco: Never Used  Substance and Sexual Activity  . Alcohol use: No  . Drug use: No  . Sexual activity: Never  Lifestyle  . Physical activity:    Days per week: 3 days    Minutes per session: 50 min  . Stress: Not at all  Relationships  . Social connections:    Talks on phone: Not on file    Gets together: Not on file    Attends religious service: Not on file    Active member of club or organization: Not on file    Attends meetings of clubs or organizations: Not on file  Relationship status: Not on file  . Intimate partner violence:    Fear of current or ex partner: Not on file    Emotionally abused: Not on file    Physically abused: Not on file    Forced sexual activity: Not on file  Other Topics Concern  . Not on file  Social History Narrative   Lives in [place] alone. No pets. Retired from [employer].   Diet: healthy   Exercise: water aerobics twice weekly, balance class   Ambulates with a walker at baseline    Family History:    Family History  Problem Relation Age of Onset  . Heart disease Mother 79  . Heart attack Mother 2  . Cancer Father        colon  . Diabetes Sister   . Hypertension Sister   . Cancer Brother        kidney  . Diabetes Brother   . Cancer Daughter        ovarian  . Non-Hodgkin's lymphoma Son      ROS:  Please see the history of present illness.  Review of Systems  Constitutional: Negative.   Respiratory: Negative.   Cardiovascular: Positive for palpitations.  Gastrointestinal: Negative.   Musculoskeletal: Negative.   Neurological: Positive for dizziness.  Psychiatric/Behavioral: Negative.   All other systems reviewed and are negative.   Physical Exam/Data:   Vitals:   10/17/18 1630 10/17/18 1700 10/17/18 1736 10/17/18 1827  BP: (!) 120/91 130/70 114/81 (!) 144/102  Pulse: (!) 146 (!) 144 (!) 145 (!) 143  Resp: 18 16 18 18   Temp:   97.9 F  (36.6 C)   TempSrc:      SpO2: 97% 97% 97% 97%  Weight:      Height:       No intake or output data in the 24 hours ending 10/17/18 1852 Last 3 Weights 10/17/2018 10/02/2018 08/27/2018  Weight (lbs) 205 lb 206 lb 14.4 oz 211 lb 6.4 oz  Weight (kg) 92.987 kg 93.849 kg 95.89 kg     Body mass index is 38.73 kg/m.  General:  Well nourished, well developed, in no acute distress obese HEENT: normal Lymph: no adenopathy Neck: no JVD Endocrine:  No thryomegaly Vascular: No carotid bruits; FA pulses 2+ bilaterally without bruits  Cardiac: Tachycardic, regular,  no murmur  Lungs:  clear to auscultation bilaterally, no wheezing, rhonchi or rales  Abd: soft, nontender, no hepatomegaly  Ext: no edema Musculoskeletal:  No deformities, BUE and BLE strength normal and equal Skin: warm and dry  Neuro:  CNs 2-12 intact, no focal abnormalities noted Psych:  Normal affect   EKG:  The EKG was personally reviewed and demonstrates: Atrial flutter ventricular rate 146 bpm poor R wave progression to the anterior precordial leads, left anterior fascicular block  Telemetry:  Telemetry was personally reviewed and demonstrates: Atrial flutter rate 130 bpm  Relevant CV Studies: Echocardiogram dated July 06, 2018 Left ventricle: The cavity size was normal. Systolic function was   normal. The estimated ejection fraction was in the range of 60%   to 65%. Wall motion was normal; there were no regional wall   motion abnormalities. Features are consistent with a pseudonormal   left ventricular filling pattern, with concomitant abnormal   relaxation and increased filling pressure (grade 2 diastolic   dysfunction). - Mitral valve: Calcified annulus. Mildly thickened, mildly   calcified leaflets . There was mild regurgitation. - Left atrium: The atrium was mildly to moderately dilated. -  Tricuspid valve: There was moderate regurgitation. - Pulmonary arteries: Systolic pressure was moderately increased.   PA  peak pressure: 52 mm Hg (S).   Laboratory Data:  Chemistry Recent Labs  Lab 10/17/18 1440  NA 136  K 4.7  CL 103  CO2 24  GLUCOSE 167*  BUN 25*  CREATININE 1.15*  CALCIUM 8.8*  GFRNONAA 44*  GFRAA 51*  ANIONGAP 9    No results for input(s): PROT, ALBUMIN, AST, ALT, ALKPHOS, BILITOT in the last 168 hours. Hematology Recent Labs  Lab 10/17/18 1440  WBC 10.5  RBC 4.85  HGB 12.8  HCT 41.1  MCV 84.7  MCH 26.4  MCHC 31.1  RDW 18.6*  PLT 266   Cardiac Enzymes Recent Labs  Lab 10/17/18 1440 10/17/18 1708  TROPONINI 0.05* 0.05*   No results for input(s): TROPIPOC in the last 168 hours.  BNP Recent Labs  Lab 10/17/18 1440  BNP 728.0*    DDimer No results for input(s): DDIMER in the last 168 hours.  Radiology/Studies:  Dg Chest Port 1 View  Result Date: 10/17/2018 CLINICAL DATA:  Dizziness for 1 week, tachycardia, history diabetes mellitus, CHF EXAM: PORTABLE CHEST 1 VIEW COMPARISON:  Portable exam 1536 hours compared to 07/09/2018 Correlation: CT chest 07/05/2018 FINDINGS: Enlargement of cardiac silhouette. Inferior mediastinal density consistent with hiatal hernia as identified on a prior CT. Mediastinal contours and pulmonary vascularity otherwise normal. Lungs clear. No pleural effusion or pneumothorax. Bones demineralized. IMPRESSION: Enlargement of cardiac silhouette. Hiatal hernia. No acute abnormalities. Electronically Signed   By: Lavonia Dana M.D.   On: 10/17/2018 16:03    Assessment and Plan:   1. Atrial flutter with RVR Timing of onset likely this past week 4 days ago given acute onset of palpitations, dizziness, weakness -Currently relatively asymptomatic despite going 130 bpm -She is on Cardizem infusion,  Discussed with nursing, we will increase up to 15 mg/h -Recommend we continue her metoprolol If no improvement in rate, we could add digoxin Systolic pressures in the 140 range -She is on heparin infusion Given her age, less appealing candidate  for transesophageal echo and cardioversion.  For now we will try rate control as she is asymptomatic  2) hypertension Continue metoprolol, continue Cardizem infusion Would hold losartan for now -Consider changing Lasix to IV given history of chronic diastolic CHF, and pulmonary hypertension on last echocardiogram  3) diabetes type 2 Management per primary care  4) pulmonary hypertension Moderately elevated pressures on echocardiogram 3 months ago BNP elevated 700 -Lasix IV daily   Total encounter time more than 60 minutes  Greater than 50% was spent in counseling and coordination of care with the patient   For questions or updates, please contact [facility] Please consult [email or web address] for contact info under     Signed, Ida Rogue, MD  10/17/2018 6:52 PM

## 2018-10-17 NOTE — H&P (Signed)
[place] [facility] [place] NAME: Diane Mullins    MR#:  [id number]  DATE OF BIRTH:  09/05/1933  DATE OF ADMISSION:  10/17/2018  PRIMARY CARE PHYSICIAN: [name], MD   REQUESTING/REFERRING PHYSICIAN:  Dr. Charlotte Crumb  CHIEF COMPLAINT:   Chief Complaint  Patient presents with  . Dizziness  . Tachycardia    HISTORY OF PRESENT ILLNESS:  Diane Mullins  is a 85 y.o. female with a known history of hypertension, diabetes, prior history of TIA with no residual neurological deficits, pulmonary hypertension, diastolic CHF presents to hospital secondary to dizziness and weakness. Patient states she has been doing well up until 3 days ago.  She started feeling weak and more dizzy.  Worsening with standing.  Denies any chest pain or palpitations.  She recently had pulmonary function test done and that showed moderate restrictive lung disease.  Denies any worsening dyspnea.  She scheduled an appointment to see her physician assistant today and was noted to have a heart rate in the 140s and so sent to the ED. No prior history of atrial flutter or fibrillation in the past.  Takes an aspirin at home.  Labs in the ED showing normal TSH and metabolic panel, slightly elevated troponin of 0.05.  Chest x-ray with cardiomegaly but no acute findings.  Heart rate did not improve with Cardizem pushes and is started on Cardizem drip.  PAST MEDICAL HISTORY:   Past Medical History:  Diagnosis Date  . Benign paroxysmal positional vertigo 07/17/2014  . Chronic diastolic CHF (congestive heart failure) ([place])    a. echo 10/19: EF of 60-65%, no RWMA, Gr2DD, calcified mitral annulus with mild MR, mildly to moderately dilated left atrium, moderate TR, PASP 52 mmHg  . Diabetes mellitus   . H/O: rheumatic fever   . Hyperlipidemia   . Hypertension   . Pulmonary hypertension ([place])   . Stroke [facility])    TIA Jan. 1st  . Teratoma of left ovary   . Thyroid disease     PAST  SURGICAL HISTORY:   Past Surgical History:  Procedure Laterality Date  . ABDOMINAL HYSTERECTOMY  1973   menorrhagia  . HERNIA REPAIR  1994  . ORIF ANKLE FRACTURE Right 10/29/2015   Procedure: OPEN REDUCTION INTERNAL FIXATION (ORIF) ANKLE FRACTURE;  Surgeon: Thornton Park, MD;  Location: [facility] ORS;  Service: Orthopedics;  Laterality: Right;  Marland Kitchen VAGINAL DELIVERY     x4    SOCIAL HISTORY:   Social History   Tobacco Use  . Smoking status: Never Smoker  . Smokeless tobacco: Never Used  Substance Use Topics  . Alcohol use: No    FAMILY HISTORY:   Family History  Problem Relation Age of Onset  . Heart disease Mother 42  . Heart attack Mother 86  . Cancer Father        colon  . Diabetes Sister   . Hypertension Sister   . Cancer Brother        kidney  . Diabetes Brother   . Cancer Daughter        ovarian  . Non-Hodgkin's lymphoma Son     DRUG ALLERGIES:   Allergies  Allergen Reactions  . Oxycodone     Other reaction(s): Confusion Patient's daughter reported confusion/sensitivity to oxycodone.     REVIEW OF SYSTEMS:   Review of Systems  Constitutional: Positive for malaise/fatigue. Negative for chills, fever and weight loss.  HENT: Negative for ear discharge, ear pain, hearing  loss, nosebleeds and tinnitus.   Eyes: Negative for blurred vision, double vision and photophobia.  Respiratory: Negative for cough, hemoptysis, shortness of breath and wheezing.   Cardiovascular: Negative for chest pain, palpitations, orthopnea and leg swelling.  Gastrointestinal: Positive for nausea. Negative for abdominal pain, constipation, diarrhea, heartburn, melena and vomiting.  Genitourinary: Negative for dysuria, frequency, hematuria and urgency.  Musculoskeletal: Negative for back pain, myalgias and neck pain.  Skin: Negative for rash.  Neurological: Positive for dizziness. Negative for tingling, tremors, sensory change, speech change, focal weakness and headaches.    Endo/Heme/Allergies: Does not bruise/bleed easily.  Psychiatric/Behavioral: Negative for depression.    MEDICATIONS AT HOME:   Prior to Admission medications   Medication Sig Start Date End Date Taking? Authorizing Provider  aspirin EC 81 MG tablet Take 81 mg by mouth daily.   Yes [provider]  ferrous sulfate 325 (65 FE) MG EC tablet Take 1 tablet (325 mg total) by mouth daily with breakfast. 08/30/18  Yes Guse, Jacquelynn Cree, FNP  furosemide (LASIX) 20 MG tablet Take 1 tablet (20 mg total) by mouth daily. 07/10/18  Yes Gladstone Lighter, MD  glucose blood (ONE TOUCH ULTRA TEST) test strip Use as instructed to check Blood sugar three times a day.  E11.65 Dispense patient choice. 07/30/18  Yes Guse, Jacquelynn Cree, FNP  ibuprofen (ADVIL,MOTRIN) 200 MG tablet Take 200 mg by mouth every 6 (six) hours as needed.   Yes [provider]  Insulin Glargine (LANTUS SOLOSTAR) 100 UNIT/ML Solostar Pen INJECT  17 UNITS SUBCUTANEOUSLY IN THE MORNING 08/27/18  Yes Guse, Jacquelynn Cree, FNP  Insulin Pen Needle (PEN NEEDLES) 32G X 4 MM MISC Inject 1 Dose into the skin daily. 12/19/16  Yes [name], MD  levothyroxine (SYNTHROID, LEVOTHROID) 75 MCG tablet Take 1 tablet (75 mcg total) by mouth daily. 04/09/18  Yes [name], MD  losartan (COZAAR) 50 MG tablet Take 50 mg by mouth daily.   Yes [provider]  metFORMIN (GLUCOPHAGE) 1000 MG tablet Take 1 tablet (1,000 mg total) by mouth 2 (two) times daily with a meal. TAKE 1 TABLET TWICE DAILY  WITH  A  MEAL 04/09/18  Yes [name], MD  metoprolol succinate (TOPROL-XL) 25 MG 24 hr tablet Take 1 tablet (25 mg total) by mouth daily. 08/27/18  Yes Guse, Jacquelynn Cree, FNP  omeprazole (PRILOSEC) 20 MG capsule Take 1 capsule (20 mg total) by mouth daily. 04/09/18  Yes [name], MD  polyethylene glycol [facility] / GLYCOLAX) packet Take 17 g by mouth daily as needed.    Yes [provider]  potassium chloride (K-DUR) 10  MEQ tablet TAKE 1 TABLET BY MOUTH EVERY DAY 09/12/18  Yes [name], MD  simvastatin (ZOCOR) 40 MG tablet Take 1 tablet (40 mg total) by mouth every evening. 04/09/18  Yes [name], MD      VITAL SIGNS:  Blood pressure 103/87, pulse (!) 145, temperature 97.6 F (36.4 C), temperature source Oral, resp. rate (!) 23, height 5\' 1"  (1.549 m), weight 93 kg, SpO2 94 %.  PHYSICAL EXAMINATION:   Physical Exam  GENERAL:  85 y.o.-year-old patient lying in the bed with no acute distress.  EYES: Pupils equal, round, reactive to light and accommodation. No scleral icterus. Extraocular muscles intact.  HEENT: Head atraumatic, normocephalic. Oropharynx and nasopharynx clear.  NECK:  Supple, no jugular venous distention. No thyroid enlargement, no tenderness.  LUNGS: Normal breath sounds bilaterally, no wheezing, rales,rhonchi or  crepitation. No use of accessory muscles of respiration. decreased bibasilar breath sounds CARDIOVASCULAR: S1, S2 rapid and regularly irregular. No  rubs, or gallops. 3/6 systolic murmur present ABDOMEN: Soft, nontender, nondistended. Bowel sounds present. No organomegaly or mass.  EXTREMITIES: No pedal edema, cyanosis, or clubbing.  NEUROLOGIC: Cranial nerves II through XII are intact. Muscle strength 5/5 in all extremities. Sensation intact. Gait not checked.  PSYCHIATRIC: The patient is alert and oriented x 3.  SKIN: No obvious rash, lesion, or ulcer.   LABORATORY PANEL:   CBC Recent Labs  Lab 10/17/18 1440  WBC 10.5  HGB 12.8  HCT 41.1  PLT 266   ------------------------------------------------------------------------------------------------------------------  Chemistries  Recent Labs  Lab 10/17/18 1440  NA 136  K 4.7  CL 103  CO2 24  GLUCOSE 167*  BUN 25*  CREATININE 1.15*  CALCIUM 8.8*   ------------------------------------------------------------------------------------------------------------------  Cardiac Enzymes Recent Labs    Lab 10/17/18 1440  TROPONINI 0.05*   ------------------------------------------------------------------------------------------------------------------  RADIOLOGY:  Dg Chest Port 1 View  Result Date: 10/17/2018 CLINICAL DATA:  Dizziness for 1 week, tachycardia, history diabetes mellitus, CHF EXAM: PORTABLE CHEST 1 VIEW COMPARISON:  Portable exam 1536 hours compared to 07/09/2018 Correlation: CT chest 07/05/2018 FINDINGS: Enlargement of cardiac silhouette. Inferior mediastinal density consistent with hiatal hernia as identified on a prior CT. Mediastinal contours and pulmonary vascularity otherwise normal. Lungs clear. No pleural effusion or pneumothorax. Bones demineralized. IMPRESSION: Enlargement of cardiac silhouette. Hiatal hernia. No acute abnormalities. Electronically Signed   By: Lavonia Dana M.D.   On: 10/17/2018 16:03    EKG:   Orders placed or performed during the hospital encounter of 10/17/18  . ED EKG  . ED EKG    IMPRESSION AND PLAN:   Diane Mullins  is a 85 y.o. female with a known history of hypertension, diabetes, prior history of TIA with no residual neurological deficits, pulmonary hypertension, diastolic CHF presents to hospital secondary to dizziness and weakness.  1.  Atrial flutter-new onset.   -TSH within normal limits.  UA is pending -Cardiology consulted.  Currently on Cardizem drip.  Also started on heparin drip given prior history of TIA. -Continue to monitor.  Continue oral Toprol. -Await cardiology input -Admit to telemetry and recycle troponins.  2.  Hypertension-continue Toprol, losartan and Lasix  3.  Diastolic heart failure-chronic.  No signs of volume overload. -Continue daily oral Lasix.  Chest x-ray without any acute findings.  No elevated JVD on exam. -BNP is pending  4.  Diabetes mellitus-on Lantus and sliding scale insulin added.  Also on metformin.  5.  DVT prophylaxis-currently on heparin drip  Ambulates with a walker at baseline.   Physical therapy consulted    All the records are reviewed and case discussed with ED provider. Management plans discussed with the patient, family and they are in agreement.  CODE STATUS: Full Code  TOTAL TIME TAKING CARE OF THIS PATIENT: 51 minutes.    Gladstone Lighter M.D on 10/17/2018 at 4:34 PM  Between 7am to 6pm - Pager - [phone]  After 6pm go to [email or web address] - password [email or web address] [facility]  Office  [phone]  CC: Primary care physician; [name], MD

## 2018-10-17 NOTE — ED Notes (Signed)
Pt got up and ambulated to the toilet in the room to void.  Pt was able to do so without difficulty.  Stand by assist by RN.  pt spilled some water, helped her out of her pants and into new hospital gown

## 2018-10-17 NOTE — ED Notes (Signed)
Date and time results received: 10/17/18 3:13 PM  Test: Troponin Critical Value: 0.05  Name of Provider Notified: McShane  cardizem

## 2018-10-17 NOTE — ED Notes (Signed)
Admitting MD is at the bedside 

## 2018-10-17 NOTE — Progress Notes (Signed)
Subjective:    Patient ID: Diane Mullins, female    DOB: 05-30-1933, 85 y.o.   MRN: [id number]  HPI  Presents to clinic c/o feeling tired, dizzy for the past few days. States she has been checking her BP and HR and has noticed her HR has been going up and down.  Patient documented her heart rate at 122, later was 127, the next day she got a rate of 169 and then later on in the day 144.  When HR elevated at home, her BP was low in the 90s/60s range. Patient has a history of paroxysmal tachycardia, states she used to be on Toprol XL 100 mg daily, but when she was in the hospital most recently her dose was decreased to Toprol-XL 25 mg. States when she walks around her home, she uses walker or leans on the walls because she is so dizzy and cant stand up straight without feeling dizzy.   Patient Active Problem List   Diagnosis Date Noted  . 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 AKI (acute kidney injury) ([place])   . Acute on chronic diastolic heart failure ([place])   . Respiratory failure ([place]) 07/05/2018  . Paroxysmal tachycardia ([place]) 11/16/2017  . Coronary artery calcification seen on CAT scan 11/16/2017  . Morbid obesity ([place]) 10/30/2017  . (HFpEF) heart failure with preserved ejection fraction ([place]) 09/28/2017  . Family history of colon cancer 04/17/2017  . Closed right ankle fracture 10/28/2015  . Fatigue 10/18/2015  . TIA (transient ischemic attack) 09/18/2015  . Orthostatic hypotension 08/02/2015  . Adjustment disorder with anxious mood 03/30/2015  . Postmenopausal estrogen deficiency 06/03/2014  . DNR (do not resuscitate) discussion 03/04/2014  . Gait disturbance 05/07/2013  . Diabetes ([place]) 04/21/2013  . Shortness of breath 12/09/2012  . Hearing loss 12/09/2012  . Anxiety 01/31/2012  . Hypertension 12/22/2011  . Hyperlipidemia 12/22/2011  . Hypothyroidism 12/22/2011   Social History   Tobacco Use  . Smoking status: Never Smoker  . Smokeless tobacco: Never Used  Substance Use  Topics  . Alcohol use: No    Review of Systems  Constitutional: +tired past 2 days  HENT: Negative for congestion, ear pain, sinus pain and sore throat.   Eyes: Negative.   Respiratory: Negative for cough, shortness of breath and wheezing.   Cardiovascular: Negative for chest pain, and leg swelling. +elevated HR, lower BPs Gastrointestinal: Negative for abdominal pain, diarrhea, nausea and vomiting.  Genitourinary: Negative for dysuria, frequency and urgency.  Musculoskeletal: Negative for arthralgias and myalgias.  Skin: Negative for color change, pallor and rash.  Neurological: Negative for syncope, light-headedness and headaches.  Psychiatric/Behavioral: The patient is not nervous/anxious.       Objective:   Physical Exam Vitals signs and nursing note reviewed.  Constitutional:      Appearance: She is obese. She is not diaphoretic.  HENT:     Head: Normocephalic and atraumatic.  Eyes:     General: No scleral icterus.    Extraocular Movements: Extraocular movements intact.     Conjunctiva/sclera: Conjunctivae normal.     Pupils: Pupils are equal, round, and reactive to light.  Cardiovascular:     Rate and Rhythm: Tachycardia present. Rhythm irregular.  Pulmonary:     Effort: Pulmonary effort is normal. No respiratory distress.     Breath sounds: Normal breath sounds. No wheezing, rhonchi or rales.  Skin:    General: Skin is warm and dry.     Coloration: Skin is not pale.  Neurological:     Mental Status: She is alert and oriented to person, place, and time.     Comments: Cannot walk more than 2 steps without feeling dizzy. Needs help up out of wheelchair to get onto exam table  Psychiatric:        Mood and Affect: Mood normal.        Behavior: Behavior normal.    Vitals:   10/17/18 1322  BP: 122/88  Pulse: (!) 117  Resp: 18  Temp: (!) 97.4 F (36.3 C)  SpO2: 96%      Assessment & Plan:   Dizziness, Tachycardia -  EKG done in clinic, reviewed by me: HR  147.   I am concerned with elevated HR, her dizziness and her living alone. Explained to patient I do not feel she can go home.  First patient was resistant, but I explained that due to her living alone and being so dizzy that she cannot take a few steps and heart rate being very elevated, it would be very unsafe for her to go home. Patient refuses ambulance but states her grandson will bring her to ER.

## 2018-10-17 NOTE — ED Provider Notes (Signed)
.[facility] Emergency Department Provider Note  ____________________________________________   I have reviewed the triage vital signs and the nursing notes. Where available I have reviewed prior notes and, if possible and indicated, outside hospital notes.    HISTORY  Chief Complaint Dizziness and Tachycardia    HPI Diane Mullins is a 85 y.o. female history of vertigo, diastolic CHF with an echo that showed 6065% EF mildly present diastolic failure, as of an echo on 10/19, Patient states that for the last couple days she has been having generalized "tiredness" and she can feel her heart racing fast.  Started she thinks on Sunday or Monday.  No chest pain or shortness of breath, no leg swelling, went to her primary care doctor and was told to come to the emergency department.   Past Medical History:  Diagnosis Date  . Benign paroxysmal positional vertigo 07/17/2014  . Chronic diastolic CHF (congestive heart failure) ([place])    a. echo 10/19: EF of 60-65%, no RWMA, Gr2DD, calcified mitral annulus with mild MR, mildly to moderately dilated left atrium, moderate TR, PASP 52 mmHg  . Diabetes mellitus   . H/O: rheumatic fever   . Hyperlipidemia   . Hypertension   . Pulmonary hypertension ([place])   . Stroke [facility])    TIA Jan. 1st  . Teratoma of left ovary   . Thyroid disease     Patient Active Problem List   Diagnosis Date Noted  . 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 AKI (acute kidney injury) ([place])   . Acute on chronic diastolic heart failure ([place])   . Respiratory failure ([place]) 07/05/2018  . Paroxysmal tachycardia ([place]) 11/16/2017  . Coronary artery calcification seen on CAT scan 11/16/2017  . Morbid obesity ([place]) 10/30/2017  . (HFpEF) heart failure with preserved ejection fraction ([place]) 09/28/2017  . Family history of colon cancer 04/17/2017  . Closed right ankle fracture 10/28/2015  . Fatigue 10/18/2015  . TIA (transient ischemic attack) 09/18/2015   . Orthostatic hypotension 08/02/2015  . Adjustment disorder with anxious mood 03/30/2015  . Postmenopausal estrogen deficiency 06/03/2014  . DNR (do not resuscitate) discussion 03/04/2014  . Gait disturbance 05/07/2013  . Diabetes ([place]) 04/21/2013  . Shortness of breath 12/09/2012  . Hearing loss 12/09/2012  . Anxiety 01/31/2012  . Hypertension 12/22/2011  . Hyperlipidemia 12/22/2011  . Hypothyroidism 12/22/2011    Past Surgical History:  Procedure Laterality Date  . ABDOMINAL HYSTERECTOMY  1973   menorrhagia  . HERNIA REPAIR  1994  . ORIF ANKLE FRACTURE Right 10/29/2015   Procedure: OPEN REDUCTION INTERNAL FIXATION (ORIF) ANKLE FRACTURE;  Surgeon: Thornton Park, MD;  Location: [facility] ORS;  Service: Orthopedics;  Laterality: Right;  Marland Kitchen VAGINAL DELIVERY     x4    Prior to Admission medications   Medication Sig Start Date End Date Taking? Authorizing Provider  aspirin EC 81 MG tablet Take 81 mg by mouth daily.    [provider]  ferrous sulfate 325 (65 FE) MG EC tablet Take 1 tablet (325 mg total) by mouth daily with breakfast. 08/30/18   Guse, Jacquelynn Cree, FNP  furosemide (LASIX) 20 MG tablet Take 1 tablet (20 mg total) by mouth daily. 07/10/18   Gladstone Lighter, MD  glucose blood (ONE TOUCH ULTRA TEST) test strip Use as instructed to check Blood sugar three times a day.  E11.65 Dispense patient choice. 07/30/18   Jodelle Green, FNP  ibuprofen (ADVIL,MOTRIN) 200 MG tablet Take 200 mg by mouth every  6 (six) hours as needed.    [provider]  Insulin Glargine (LANTUS SOLOSTAR) 100 UNIT/ML Solostar Pen INJECT  17 UNITS SUBCUTANEOUSLY IN THE MORNING 08/27/18   Guse, Jacquelynn Cree, FNP  Insulin Pen Needle (PEN NEEDLES) 32G X 4 MM MISC Inject 1 Dose into the skin daily. 12/19/16   [name], MD  levothyroxine (SYNTHROID, LEVOTHROID) 75 MCG tablet Take 1 tablet (75 mcg total) by mouth daily. 04/09/18   [name], MD  losartan (COZAAR) 50 MG tablet Take 50 mg  by mouth daily.    [provider]  magnesium oxide (MAG-OX) 400 MG tablet Take 400 mg by mouth daily.    [provider]  metFORMIN (GLUCOPHAGE) 1000 MG tablet Take 1 tablet (1,000 mg total) by mouth 2 (two) times daily with a meal. TAKE 1 TABLET TWICE DAILY  WITH  A  MEAL 04/09/18   [name], MD  metoprolol succinate (TOPROL-XL) 25 MG 24 hr tablet Take 1 tablet (25 mg total) by mouth daily. 08/27/18   Jodelle Green, FNP  omeprazole (PRILOSEC) 20 MG capsule Take 1 capsule (20 mg total) by mouth daily. 04/09/18   [name], MD  ondansetron (ZOFRAN ODT) 4 MG disintegrating tablet Take 1 tablet (4 mg total) by mouth every 8 (eight) hours as needed for nausea or vomiting. 07/05/18   Carrie Mew, MD  polyethylene glycol [facility]) packet Take 17 g by mouth daily as needed.     [provider]  potassium chloride (K-DUR) 10 MEQ tablet TAKE 1 TABLET BY MOUTH EVERY DAY 09/12/18   [name], MD  simvastatin (ZOCOR) 40 MG tablet Take 1 tablet (40 mg total) by mouth every evening. 04/09/18   [name], MD    Allergies Oxycodone  Family History  Problem Relation Age of Onset  . Heart disease Mother 40  . Heart attack Mother 21  . Cancer Father        colon  . Diabetes Sister   . Hypertension Sister   . Cancer Brother        kidney  . Diabetes Brother   . Cancer Daughter        ovarian  . Non-Hodgkin's lymphoma Son     Social History Social History   Tobacco Use  . Smoking status: Never Smoker  . Smokeless tobacco: Never Used  Substance Use Topics  . Alcohol use: No  . Drug use: No    Review of Systems Constitutional: No fever/chills Eyes: No visual changes. ENT: No sore throat. No stiff neck no neck pain Cardiovascular: Denies chest pain. Respiratory: Denies shortness of breath. Gastrointestinal:   no vomiting.  No diarrhea.  No constipation. Genitourinary: Negative for dysuria. Musculoskeletal:  Negative lower extremity swelling Skin: Negative for rash. Neurological: Negative for severe headaches, focal weakness or numbness.   ____________________________________________   PHYSICAL EXAM:  VITAL SIGNS: ED Triage Vitals  Enc Vitals Group     BP 10/17/18 1438 111/83     Pulse Rate 10/17/18 1438 (!) 145     Resp 10/17/18 1438 18     Temp 10/17/18 1438 97.6 F (36.4 C)     Temp Source 10/17/18 1438 Oral     SpO2 10/17/18 1438 95 %     Weight 10/17/18 1436 205 lb (93 kg)     Height 10/17/18 1436 5\' 1"  (1.549 m)     Head Circumference --      Peak Flow --  Pain Score 10/17/18 1436 0     Pain Loc --      Pain Edu? --      Excl. in [place]? --     Constitutional: Alert and oriented. Well appearing and in no acute distress. Eyes: Conjunctivae are normal Head: Atraumatic HEENT: No congestion/rhinnorhea. Mucous membranes are moist.  Oropharynx non-erythematous Neck:   Nontender with no meningismus, no masses, no stridor Cardiovascular: Tachycardia noted grossly normal heart sounds.  Good peripheral circulation. Respiratory: Normal respiratory effort.  No retractions. Lungs CTAB. Abdominal: Soft and nontender. No distention. No guarding no rebound Back:  There is no focal tenderness or step off.  there is no midline tenderness there are no lesions noted. there is no CVA tenderness Musculoskeletal: No lower extremity tenderness, no upper extremity tenderness. No joint effusions, no DVT signs strong distal pulses no edema Neurologic:  Normal speech and language. No gross focal neurologic deficits are appreciated.  Skin:  Skin is warm, dry and intact. No rash noted. Psychiatric: Mood and affect are normal. Speech and behavior are normal.  ____________________________________________   LABS (all labs ordered are listed, but only abnormal results are displayed)  Labs Reviewed  CBC - Abnormal; Notable for the following components:      Result Value   RDW 18.6 (*)    All  other components within normal limits  BASIC METABOLIC PANEL  URINALYSIS, COMPLETE (UACMP) WITH MICROSCOPIC  TROPONIN I    Pertinent labs  results that were available during my care of the patient were reviewed by me and considered in my medical decision making (see chart for details). ____________________________________________  EKG  I personally interpreted any EKGs ordered by me or triage Patient's EKG shows atrial flutter most likely, rate 146, this is non-variant on the monitor as well, no likely rate related ST changes are noted related changes, normal axis. ____________________________________________  RADIOLOGY  Pertinent labs & imaging results that were available during my care of the patient were reviewed by me and considered in my medical decision making (see chart for details). If possible, patient and/or family made aware of any abnormal findings.  No results found. ____________________________________________    PROCEDURES  Procedure(s) performed: None  Procedures  Critical Care performed: CRITICAL CARE Performed by: Schuyler Amor   Total critical care time: 35 minutes  Critical care time was exclusive of separately billable procedures and treating other patients.  Critical care was necessary to treat or prevent imminent or life-threatening deterioration.  Critical care was time spent personally by me on the following activities: development of treatment plan with patient and/or surrogate as well as nursing, discussions with consultants, evaluation of patient's response to treatment, examination of patient, obtaining history from patient or surrogate, ordering and performing treatments and interventions, ordering and review of laboratory studies, ordering and review of radiographic studies, pulse oximetry and re-evaluation of patient's condition.   ____________________________________________   INITIAL IMPRESSION / ASSESSMENT AND PLAN / ED  COURSE  Pertinent labs & imaging results that were available during my care of the patient were reviewed by me and considered in my medical decision making (see chart for details).  Here with atrial flutter since Monday at least, no chest pain or shortness of breath and largely asymptomatic except for feeling generally but not specifically or focally weak.  Patient does have no history of this.  She is not on blood thinners.  We will start her on Cardizem to see if we can provide her with some rate control,  and given her age we are checking basic blood work and will admit.    ____________________________________________   FINAL CLINICAL IMPRESSION(S) / ED DIAGNOSES  Final diagnoses:  None      This chart was dictated using voice recognition software.  Despite best efforts to proofread,  errors can occur which can change meaning.      Schuyler Amor, MD 10/17/18 [phone]

## 2018-10-17 NOTE — ED Notes (Signed)
Pt given a pillow. Denies pain or any other needs.

## 2018-10-17 NOTE — Telephone Encounter (Signed)
HE4 has resulted. Attempted to call [name] with the results. Phone line busy x2 attempts. I will attempt at a later time.  Results for Diane Mullins, Diane Mullins (MRN [id number]) as of 10/17/2018 13:55  Ref. Range 09/25/2018 12:38  HE4 Latest Ref Range: 0.0 - 96.9 pmol/L 100.0 (H)

## 2018-10-18 DIAGNOSIS — I272 Pulmonary hypertension, unspecified: Secondary | ICD-10-CM

## 2018-10-18 DIAGNOSIS — J810 Acute pulmonary edema: Secondary | ICD-10-CM

## 2018-10-18 LAB — BASIC METABOLIC PANEL
Anion gap: 7 (ref 5–15)
BUN: 24 mg/dL — ABNORMAL HIGH (ref 8–23)
CO2: 26 mmol/L (ref 22–32)
Calcium: 8.6 mg/dL — ABNORMAL LOW (ref 8.9–10.3)
Chloride: 105 mmol/L (ref 98–111)
Creatinine, Ser: 1.03 mg/dL — ABNORMAL HIGH (ref 0.44–1.00)
GFR calc Af Amer: 58 mL/min — ABNORMAL LOW (ref >60)
GFR calc non Af Amer: 50 mL/min — ABNORMAL LOW (ref >60)
Glucose, Bld: 144 mg/dL — ABNORMAL HIGH (ref 70–99)
Potassium: 4 mmol/L (ref 3.5–5.1)
Sodium: 138 mmol/L (ref 135–145)

## 2018-10-18 LAB — CBC
HCT: 36.6 % (ref 36.0–46.0)
Hemoglobin: 11.6 g/dL — ABNORMAL LOW (ref 12.0–15.0)
MCH: 26.6 pg (ref 26.0–34.0)
MCHC: 31.7 g/dL (ref 30.0–36.0)
MCV: 83.9 fL (ref 80.0–100.0)
Platelets: 233 10*3/uL (ref 150–400)
RBC: 4.36 MIL/uL (ref 3.87–5.11)
RDW: 18.3 % — ABNORMAL HIGH (ref 11.5–15.5)
WBC: 8.8 10*3/uL (ref 4.0–10.5)
nRBC: 0 % (ref 0.0–0.2)

## 2018-10-18 LAB — TROPONIN I: Troponin I: 0.05 ng/mL (ref <0.03)

## 2018-10-18 LAB — HEPARIN LEVEL (UNFRACTIONATED)
HEPARIN UNFRACTIONATED: 0.38 [IU]/mL (ref 0.30–0.70)
Heparin Unfractionated: 0.25 IU/mL — ABNORMAL LOW (ref 0.30–0.70)
Heparin Unfractionated: 0.42 IU/mL (ref 0.30–0.70)

## 2018-10-18 LAB — GLUCOSE, CAPILLARY
GLUCOSE-CAPILLARY: 140 mg/dL — AB (ref 70–99)
Glucose-Capillary: 200 mg/dL — ABNORMAL HIGH (ref 70–99)
Glucose-Capillary: 142 mg/dL — ABNORMAL HIGH (ref 70–99)
Glucose-Capillary: 120 mg/dL — ABNORMAL HIGH (ref 70–99)

## 2018-10-18 MED ORDER — DILTIAZEM HCL-DEXTROSE 100-5 MG/100ML-% IV SOLN (PREMIX)
5.0000 mg/h | INTRAVENOUS | Status: DC
  Administered 2018-10-19: 5 mg/h via INTRAVENOUS
  Administered 2018-10-20: 11.5 mg/h via INTRAVENOUS
  Administered 2018-10-20: 15 mg/h via INTRAVENOUS
  Administered 2018-10-20: 13 mg/h via INTRAVENOUS
  Administered 2018-10-20: 15 mg/h via INTRAVENOUS
  Administered 2018-10-20: 7.5 mg/h via INTRAVENOUS
  Administered 2018-10-20 (×2): 15 mg/h via INTRAVENOUS
  Administered 2018-10-20: 7.5 mg/h via INTRAVENOUS
  Administered 2018-10-20: 9 mg/h via INTRAVENOUS
  Administered 2018-10-20: 10 mg/h via INTRAVENOUS
  Administered 2018-10-20: 12.5 mg/h via INTRAVENOUS
  Administered 2018-10-21 – 2018-10-22 (×5): 15 mg/h via INTRAVENOUS
  Filled 2018-10-18 (×10): qty 100

## 2018-10-18 MED ORDER — TEMAZEPAM 15 MG PO CAPS
15.0000 mg | ORAL_CAPSULE | Freq: Every evening | ORAL | Status: DC | PRN
  Administered 2018-10-19 – 2018-10-27 (×7): 15 mg via ORAL
  Filled 2018-10-18 (×7): qty 1

## 2018-10-18 MED ORDER — VITAMIN B-12 1000 MCG PO TABS
1000.0000 ug | ORAL_TABLET | Freq: Every day | ORAL | Status: DC
  Administered 2018-10-18 – 2018-10-28 (×11): 1000 ug via ORAL
  Filled 2018-10-18 (×11): qty 1

## 2018-10-18 MED ORDER — DIGOXIN 0.25 MG/ML IJ SOLN
0.2500 mg | Freq: Once | INTRAMUSCULAR | Status: AC
  Administered 2018-10-18: 0.25 mg via INTRAVENOUS
  Filled 2018-10-18: qty 2

## 2018-10-18 MED ORDER — HEPARIN BOLUS VIA INFUSION
900.0000 [IU] | Freq: Once | INTRAVENOUS | Status: AC
  Administered 2018-10-18: 900 [IU] via INTRAVENOUS
  Filled 2018-10-18: qty 900

## 2018-10-18 MED ORDER — DILTIAZEM HCL-DEXTROSE 100-5 MG/100ML-% IV SOLN (PREMIX)
5.0000 mg/h | INTRAVENOUS | Status: DC
  Administered 2018-10-18: 7.5 mg/h via INTRAVENOUS
  Administered 2018-10-19: 5 mg/h via INTRAVENOUS
  Filled 2018-10-18 (×2): qty 100

## 2018-10-18 MED ORDER — DILTIAZEM HCL 30 MG PO TABS
30.0000 mg | ORAL_TABLET | Freq: Four times a day (QID) | ORAL | Status: DC
  Administered 2018-10-18 – 2018-10-19 (×3): 30 mg via ORAL
  Filled 2018-10-18 (×3): qty 1

## 2018-10-18 MED ORDER — SODIUM CHLORIDE 0.9 % IV SOLN
1.0000 g | INTRAVENOUS | Status: DC
  Administered 2018-10-18 – 2018-10-21 (×4): 1 g via INTRAVENOUS
  Filled 2018-10-18 (×3): qty 1
  Filled 2018-10-18: qty 10

## 2018-10-18 MED ORDER — ADULT MULTIVITAMIN W/MINERALS CH
1.0000 | ORAL_TABLET | Freq: Every day | ORAL | Status: DC
  Administered 2018-10-19 – 2018-10-28 (×10): 1 via ORAL
  Filled 2018-10-18 (×10): qty 1

## 2018-10-18 MED ORDER — FUROSEMIDE 40 MG PO TABS
40.0000 mg | ORAL_TABLET | Freq: Every day | ORAL | Status: DC
  Administered 2018-10-18: 40 mg via ORAL

## 2018-10-18 MED ORDER — FUROSEMIDE 40 MG PO TABS
40.0000 mg | ORAL_TABLET | Freq: Two times a day (BID) | ORAL | Status: DC
  Administered 2018-10-18 – 2018-10-20 (×4): 40 mg via ORAL
  Filled 2018-10-18 (×4): qty 1

## 2018-10-18 NOTE — Progress Notes (Signed)
Advanced care plan. Purpose of the Encounter: CODE STATUS Parties in Attendance: Patient Patient's Decision Capacity: Good Subjective/Patient's story: Presented to emergency room for palpitations Objective/Medical story Patient is in atrial flutter and has dyspnea Needs IV Cardizem drip for rate control Cardiology evaluation Goals of care determination:  Advance care directives goals of care and treatment plan discussed Patient wants everything done which includes CPR, intubation ventilator the need arises CODE STATUS: Full code Time spent discussing advanced care planning: 16 minutes

## 2018-10-18 NOTE — Plan of Care (Signed)
  Problem: Activity: Goal: Risk for activity intolerance will decrease Outcome: Progressing   Problem: Pain Managment: Goal: General experience of comfort will improve Outcome: Progressing   Problem: Safety: Goal: Ability to remain free from injury will improve Outcome: Progressing   Problem: Activity: Goal: Ability to tolerate increased activity will improve Outcome: Progressing   Problem: Clinical Measurements: Goal: Cardiovascular complication will be avoided Outcome: Not Progressing Note:  Patient heart rhythm still aflutter. Rate is now controlled.

## 2018-10-18 NOTE — Progress Notes (Signed)
ANTICOAGULATION CONSULT NOTE - Initial Consult  Pharmacy Consult for Heparin drip Indication: atrial fibrillation  Allergies  Allergen Reactions  . Oxycodone     Other reaction(s): Confusion Patient's daughter reported confusion/sensitivity to oxycodone.     Patient Measurements: Height: 5\' 1"  (154.9 cm) Weight: 138 lb 12.5 oz (62.9 kg) IBW/kg (Calculated) : 47.8 Heparin Dosing Weight: 69.7 kg  Vital Signs: Temp: 97.7 F (36.5 C) (01/31 0933) Temp Source: Oral (01/31 0353) BP: 91/59 (01/31 1038) Pulse Rate: 107 (01/31 1038)  Labs: Recent Labs    10/17/18 1440 10/17/18 1703 10/17/18 1708 10/17/18 2249 10/18/18 0207 10/18/18 0442 10/18/18 1056  HGB 12.8  --   --   --   --  11.6*  --   HCT 41.1  --   --   --   --  36.6  --   PLT 266  --   --   --   --  233  --   APTT  --  24  --   --   --   --   --   LABPROT  --  11.8  --   --   --   --   --   INR  --  0.87  --   --   --   --   --   HEPARINUNFRC  --   --   --   --  0.25*  --  0.38  CREATININE 1.15*  --   --   --   --  1.03*  --   TROPONINI 0.05*  --  0.05* 0.05*  --  0.05*  --     Estimated Creatinine Clearance: 34.6 mL/min (A) (by C-G formula based on SCr of 1.03 mg/dL (H)).   Medical History: Past Medical History:  Diagnosis Date  . Benign paroxysmal positional vertigo 07/17/2014  . Chronic diastolic CHF (congestive heart failure) ([place])    a. echo 10/19: EF of 60-65%, no RWMA, Gr2DD, calcified mitral annulus with mild MR, mildly to moderately dilated left atrium, moderate TR, PASP 52 mmHg  . Diabetes mellitus   . H/O: rheumatic fever   . Hyperlipidemia   . Hypertension   . Pulmonary hypertension ([place])   . Stroke [facility])    TIA Jan. 1st  . Teratoma of left ovary   . Thyroid disease     Medications:  Scheduled:  . aspirin EC  81 mg Oral Daily  . ferrous sulfate  325 mg Oral Q breakfast  . furosemide  40 mg Oral Daily  . insulin aspart  0-5 Units Subcutaneous QHS  . insulin aspart  0-9 Units  Subcutaneous TID WC  . insulin glargine  17 Units Subcutaneous Daily  . levothyroxine  75 mcg Oral QAC breakfast  . metFORMIN  1,000 mg Oral BID WC  . metoprolol succinate  25 mg Oral BID  . pantoprazole  40 mg Oral Daily  . potassium chloride  10 mEq Oral Daily  . simvastatin  40 mg Oral QPM   Infusions:  . cefTRIAXone (ROCEPHIN)  IV    . diltiazem (CARDIZEM) infusion 7.5 mg/hr (10/18/18 1035)  . heparin 1,000 Units/hr (10/18/18 0308)    Assessment: 85 yo F to start Heparin drip for new Afib. Patient on ASA at home. Hgb 12.8  Plt 266  INR 0.87  APTT 24  01/31 @ 0200 HL 0.25  rebolus w/ heparin 900 units IV x 1 and increase rate to 1000 units/hr 01/31 @ 1056 HL 0.38 continue  current rate.   Goal of Therapy:  Heparin level 0.3-0.7 units/ml Monitor platelets by anticoagulation protocol: Yes   Plan:  Heparin level therapeutic. Will continue current rate and will recheck HL @ 1900. Baseline labs WNL, F/u CBC w/ am labs.  Eleonore Chiquito, PharmD, BCPS Clinical Pharmacist 10/18/2018

## 2018-10-18 NOTE — Progress Notes (Signed)
HR sustaining in 140s a. Flutter / pt asymptomatic / Dr. Rockey Situ made aware/ orders given / will monitor.

## 2018-10-18 NOTE — Progress Notes (Addendum)
[facility] [place] NAME: Diane Mullins    MR#:  [id number]  DATE OF BIRTH:  [birth year]  SUBJECTIVE:  CHIEF COMPLAINT:   Chief Complaint  Patient presents with  . Dizziness  . Tachycardia  Patient seen and evaluated today Has some palpitations Rate better controlled with Cardizem drip Decreased shortness of breath  REVIEW OF SYSTEMS:    ROS  CONSTITUTIONAL: No documented fever. No fatigue, weakness. No weight gain, no weight loss.  EYES: No blurry or double vision.  ENT: No tinnitus. No postnasal drip. No redness of the oropharynx.  RESPIRATORY: No cough, no wheeze, no hemoptysis. No dyspnea.  CARDIOVASCULAR: No chest pain. No orthopnea. Had palpitations. No syncope.  GASTROINTESTINAL: No nausea, no vomiting or diarrhea. No abdominal pain. No melena or hematochezia.  GENITOURINARY: No dysuria or hematuria.  ENDOCRINE: No polyuria or nocturia. No heat or cold intolerance.  HEMATOLOGY: No anemia. No bruising. No bleeding.  INTEGUMENTARY: No rashes. No lesions.  MUSCULOSKELETAL: No arthritis. No swelling. No gout.  NEUROLOGIC: No numbness, tingling, or ataxia. No seizure-type activity.  PSYCHIATRIC: No anxiety. No insomnia. No ADD.   DRUG ALLERGIES:   Allergies  Allergen Reactions  . Oxycodone     Other reaction(s): Confusion Patient's daughter reported confusion/sensitivity to oxycodone.     VITALS:  Blood pressure (!) 104/53, pulse 71, temperature 97.7 F (36.5 C), resp. rate 18, height 5\' 1"  (1.549 m), weight 62.9 kg, SpO2 94 %.  PHYSICAL EXAMINATION:   Physical Exam  GENERAL:  85 y.o.-year-old patient lying in the bed with no acute distress.  EYES: Pupils equal, round, reactive to light and accommodation. No scleral icterus. Extraocular muscles intact.  HEENT: Head atraumatic, normocephalic. Oropharynx and nasopharynx clear.  NECK:  Supple, no jugular venous distention. No thyroid enlargement, no tenderness.  LUNGS:  Normal breath sounds bilaterally, no wheezing, rales, rhonchi. No use of accessory muscles of respiration.  CARDIOVASCULAR: S1, S2 irregular. No murmurs, rubs, or gallops.  ABDOMEN: Soft, nontender, nondistended. Bowel sounds present. No organomegaly or mass.  EXTREMITIES: No cyanosis, clubbing or edema b/l.    NEUROLOGIC: Cranial nerves II through XII are intact. No focal Motor or sensory deficits b/l.   PSYCHIATRIC: The patient is alert and oriented x 3.  SKIN: No obvious rash, lesion, or ulcer.   LABORATORY PANEL:   CBC Recent Labs  Lab 10/18/18 0442  WBC 8.8  HGB 11.6*  HCT 36.6  PLT 233   ------------------------------------------------------------------------------------------------------------------ Chemistries  Recent Labs  Lab 10/18/18 0442  NA 138  K 4.0  CL 105  CO2 26  GLUCOSE 144*  BUN 24*  CREATININE 1.03*  CALCIUM 8.6*   ------------------------------------------------------------------------------------------------------------------  Cardiac Enzymes Recent Labs  Lab 10/18/18 0442  TROPONINI 0.05*   ------------------------------------------------------------------------------------------------------------------  RADIOLOGY:  Dg Chest Port 1 View  Result Date: 10/17/2018 CLINICAL DATA:  Dizziness for 1 week, tachycardia, history diabetes mellitus, CHF EXAM: PORTABLE CHEST 1 VIEW COMPARISON:  Portable exam 1536 hours compared to 07/09/2018 Correlation: CT chest 07/05/2018 FINDINGS: Enlargement of cardiac silhouette. Inferior mediastinal density consistent with hiatal hernia as identified on a prior CT. Mediastinal contours and pulmonary vascularity otherwise normal. Lungs clear. No pleural effusion or pneumothorax. Bones demineralized. IMPRESSION: Enlargement of cardiac silhouette. Hiatal hernia. No acute abnormalities. Electronically Signed   By: Lavonia Dana M.D.   On: 10/17/2018 16:03     ASSESSMENT AND PLAN:  85 year old elderly female patient with  history of diabetes mellitus type 2, hypertension, TIA in  the past, pulmonary hypertension, diastolic heart failure currently under hospitalist service for palpitations and dizziness  -Atrial flutter new onset Transition from IV Cardizem drip to oral metoprolol and oral Cardizem Status post cardiology follow-up Transition from IV heparin drip to oral Eliquis for anticoagulation Outpatient cardioversion after 4 weeks  -Chronic diastolic heart failure with no signs of volume overload Continue oral Lasix for diuresis  -Pulmonary edema Worsening symptoms in the setting of atrial flutter Lasix orally 40 mg twice daily for a day or 2 then transitioning to oral Lasix 40 mg daily  -Type 2 diabetes mellitus Diabetic diet with sliding scale coverage with insulin  -DVT prophylaxis On anticoagulation with Eliquis  -Urinary tract infection Continue IV Rocephin antibiotic Follow-up urine culture  -B12 deficiency Supplemented vitamin B12   All the records are reviewed and case discussed with Care Management/Social Worker. Management plans discussed with the patient, family and they are in agreement.  CODE STATUS: Full code  DVT Prophylaxis: SCDs  TOTAL TIME TAKING CARE OF THIS PATIENT: 37 minutes.   POSSIBLE D/C IN  DAYS, DEPENDING ON CLINICAL CONDITION.  Saundra Shelling M.D on 10/18/2018 at 12:46 PM  Between 7am to 6pm - Pager - [phone]  After 6pm go to [email or web address] - password [email or web address] [facility]  Office  [phone]  CC: Primary care physician; [name], MD  Note: This dictation was prepared with Dragon dictation along with smaller phrase technology. Any transcriptional errors that result from this process are unintentional.

## 2018-10-18 NOTE — Progress Notes (Signed)
ANTICOAGULATION CONSULT NOTE - Initial Consult  Pharmacy Consult for Heparin drip Indication: atrial fibrillation  Allergies  Allergen Reactions  . Oxycodone     Other reaction(s): Confusion Patient's daughter reported confusion/sensitivity to oxycodone.     Patient Measurements: Height: 5\' 1"  (154.9 cm) Weight: 138 lb 12.5 oz (62.9 kg) IBW/kg (Calculated) : 47.8 Heparin Dosing Weight: 69.7 kg  Vital Signs: Temp: 97.7 Mullins (36.5 C) (01/31 0933) BP: 112/71 (01/31 1930) Pulse Rate: 72 (01/31 1930)  Labs: Recent Labs    10/17/18 1440 10/17/18 1703 10/17/18 1708 10/17/18 2249 10/18/18 0207 10/18/18 0442 10/18/18 1056 10/18/18 1923  HGB 12.8  --   --   --   --  11.6*  --   --   HCT 41.1  --   --   --   --  36.6  --   --   PLT 266  --   --   --   --  233  --   --   APTT  --  24  --   --   --   --   --   --   LABPROT  --  11.8  --   --   --   --   --   --   INR  --  0.87  --   --   --   --   --   --   HEPARINUNFRC  --   --   --   --  0.25*  --  0.38 0.42  CREATININE 1.15*  --   --   --   --  1.03*  --   --   TROPONINI 0.05*  --  0.05* 0.05*  --  0.05*  --   --     Estimated Creatinine Clearance: 34.6 mL/min (A) (by C-G formula based on SCr of 1.03 mg/dL (H)).   Medical History: Past Medical History:  Diagnosis Date  . Benign paroxysmal positional vertigo 07/17/2014  . Chronic diastolic CHF (congestive heart failure) ([place])    a. echo 10/19: EF of 60-65%, no RWMA, Gr2DD, calcified mitral annulus with mild MR, mildly to moderately dilated left atrium, moderate TR, PASP 52 mmHg  . Diabetes mellitus   . H/O: rheumatic fever   . Hyperlipidemia   . Hypertension   . Pulmonary hypertension ([place])   . Stroke [facility])    TIA Jan. 1st  . Teratoma of left ovary   . Thyroid disease     Medications:  Scheduled:  . aspirin EC  81 mg Oral Daily  . diltiazem  30 mg Oral Q6H  . ferrous sulfate  325 mg Oral Q breakfast  . furosemide  40 mg Oral BID  . insulin aspart  0-5 Units  Subcutaneous QHS  . insulin aspart  0-9 Units Subcutaneous TID WC  . insulin glargine  17 Units Subcutaneous Daily  . levothyroxine  75 mcg Oral QAC breakfast  . metFORMIN  1,000 mg Oral BID WC  . metoprolol succinate  25 mg Oral BID  . [START ON 10/19/2018] multivitamin with minerals  1 tablet Oral Daily  . pantoprazole  40 mg Oral Daily  . potassium chloride  10 mEq Oral Daily  . simvastatin  40 mg Oral QPM  . vitamin B-12  1,000 mcg Oral Daily   Infusions:  . cefTRIAXone (ROCEPHIN)  IV 1 g (10/18/18 1320)  . diltiazem (CARDIZEM) infusion 7.5 mg/hr (10/18/18 1911)  . diltiazem (CARDIZEM) infusion    . heparin  1,000 Units/hr (10/18/18 1616)    Assessment: 85 yo Mullins to start Heparin drip for new Afib. Patient on ASA at home. Hgb 12.8  Plt 266  INR 0.87  APTT 24  01/31 @ 0200 HL 0.25  rebolus w/ heparin 900 units IV x 1 and increase rate to 1000 units/hr 01/31 @ 1056 HL 0.38 continue current rate.  01/31 @ 1923 HL 0.42 continue current rate  Goal of Therapy:  Heparin level 0.3-0.7 units/ml Monitor platelets by anticoagulation protocol: Yes   Plan:  Heparin level therapeutic. Will continue current rate and will recheck HL and CBC w/ am labs.  Lu Duffel, PharmD, BCPS Clinical Pharmacist 10/18/2018 7:56 PM

## 2018-10-18 NOTE — Progress Notes (Signed)
Inpatient Diabetes Program Recommendations  AACE/ADA: New Consensus Statement on Inpatient Glycemic Control   Target Ranges:  Prepandial:   less than 140 mg/dL      Peak postprandial:   less than 180 mg/dL (1-2 hours)      Critically ill patients:  140 - 180 mg/dL  Results for PATT, STEINHARDT (MRN [id number]) as of 10/18/2018 12:33  Ref. Range 10/17/2018 17:42 10/17/2018 20:43 10/18/2018 07:31 10/18/2018 11:57  Glucose-Capillary Latest Ref Range: 70 - 99 mg/dL 123 (H) 173 (H) 140 (H) 200 (H)   Results for GRACELAND, WACHTER (MRN [id number]) as of 10/18/2018 12:33  Ref. Range 07/05/2018 23:45 10/17/2018 17:08  Hemoglobin A1C Latest Ref Range: 4.8 - 5.6 % 8.1 (H) 9.6 (H)    Review of Glycemic Control  Diabetes history: DM2 Outpatient Diabetes medications: Lantus 17 units QAM, Metformin 1000 mg BID Current orders for Inpatient glycemic control: Lantus 17 units daily, Novolog 0-9 units TID with meals, Novolog 0-5 units QHS, Metformin 1000 mg BID  Inpatient Diabetes Program Recommendations:  HgbA1C: A1C 9.6% on 10/17/18 indicating an average glucose of 229 mg/dl over the past 2-3 months. Patient may need adjustments with outpatient DM medications.  NOTE: In reviewing chart, noted patient was an inpatient from 07/05/18 to 07/09/18 and at time of discharge Amaryl 4 mg daily was stopped due to hypoglycemia. Patient seen her PCP on 08/27/18 and per office note "After patient was in the hospital and the skilled nursing facility, medication changes were made.  Patient has stopped the glimepiride and her Lantus dose was decreased to 12 units, at last visit we did bump the Lantus up to 14 units/day.  Glimepiride was stopped because patient was having overnight lows.  Patient has been monitoring her blood sugars at home and her range has been in the 150s to 180s, did have 2 readings over the past 4 weeks that were 200 and then 222. I would prefer not to start patient back on glimepiride due to her having overnight lows  with this medication in the past.  Patient and I discussed different options and we both agreed that we can go up a few units on the Lantus rather than going back on glimepiride.  Patient will take Lantus 17 units once per day.  She will continue to keep a log of blood sugar readings."  Thanks, Barnie Alderman, RN, MSN, CDE Diabetes Coordinator Inpatient Diabetes Program [phone] (Team Pager from 8am to 5pm)

## 2018-10-18 NOTE — Progress Notes (Signed)
Heart rate increased late PM, up to 140 bpm (up from 70s) On diltiazem 30 Q6 and metoprolol 25 BID  Given Digoxin 0.25 x 2 with no improvement in rate -Diltiazem infusion restarted 7.5 mg/hr Rate improved -To wean off the infusion, she will likely require diltiazem ER 180 daily with digoxin and metoprolol --If unable to tolerate medical management, she may require TEE and Cardioversion.  Signed, Esmond Plants, MD, Ph.D [facility]

## 2018-10-18 NOTE — Evaluation (Signed)
Physical Therapy Evaluation Patient Details Name: Diane Mullins MRN: [id number] DOB: [birth year] Today's Date: 10/18/2018   History of Present Illness  presented to ER secondary to dizziness, weakness; admitted for management of A-flutter.  Mild elevation in troponin (0.05) noted; currently on cardizem and heparin drip for medical management  Clinical Impression  Patient resting with eyes closed upon arrival to room; awakens easily to voice.  Does endorse nausea/vomiting this AM, but agreeable to "some" participation with evaluation.  Appears generally weak and deconditioned due to current illness; denies pain. Able to complete supine LE therex (1x10) with sup/min assist; constant encouragement for attention to and participation with task.  Fair strength/ROM, but does fatigue quickly.  Adamantly refuses attempts at unsupported sitting or OOB to chair despite encouragement; will continue to assess/progress as appropriate. Of note, HR steady and 70-80 throughout session; appropriate response to activity noted.  Denies chest pain/pressure or palpitations during session. Would benefit from skilled PT to address above deficits and promote optimal return to PLOF; recommend transition to STR upon discharge from acute hospitalization.  Will continue to assess and update recommendations as functional status improves and full mobility assessment complete.     Follow Up Recommendations SNF(will continue to asses and update as functional status improves and full mobility assessment complete)    Equipment Recommendations       Recommendations for Other Services       Precautions / Restrictions Precautions Precautions: Fall Restrictions Weight Bearing Restrictions: No      Mobility  Bed Mobility               General bed mobility comments: patient refused due to nausea/general malaise  Transfers                 General transfer comment: patient refused due to nausea/general  malaise  Ambulation/Gait             General Gait Details: patient refused due to nausea/general malaise  Stairs            Wheelchair Mobility    Modified Rankin (Stroke Patients Only)       Balance Overall balance assessment: (patient refused due to nausea/general malaise)                                           Pertinent Vitals/Pain Pain Assessment: No/denies pain    Home Living Family/patient expects to be discharged to:: [facility])                 Additional Comments: Single level, no steps required.  Walk in shower with seat; grab bars throughout apartment.    Prior Function Level of Independence: Independent with assistive device(s)         Comments: Mod indep with ADLs, household distances with 4WRW; no driving.  Family local and available for check in intermittently. Denies recent fall history.     Hand Dominance   Dominant Hand: Right    Extremity/Trunk Assessment   Upper Extremity Assessment Upper Extremity Assessment: Generalized weakness(grossly at least 4-/5 throughout)    Lower Extremity Assessment Lower Extremity Assessment: Generalized weakness(grossly at least 4-/5 throughout)       Communication   Communication: HOH  Cognition Arousal/Alertness: Awake/alert Behavior During Therapy: WFL for tasks assessed/performed Overall Cognitive Status: Within Functional Limits for tasks assessed  General Comments      Exercises Other Exercises Other Exercises: Supine LE therex, 1x10, AROM for muscular strength/endurance: ankle pumps, heel slides, hip abduct/adduct and SLR.  constant verbal cuing/encouragement for participation; generally weak and deconditioned   Assessment/Plan    PT Assessment Patient needs continued PT services  PT Problem List Decreased strength;Decreased activity tolerance;Decreased balance;Decreased  mobility;Decreased safety awareness;Decreased knowledge of precautions;Decreased knowledge of use of DME;Cardiopulmonary status limiting activity       PT Treatment Interventions DME instruction;Gait training;Functional mobility training;Therapeutic activities;Therapeutic exercise;Balance training;Patient/family education    PT Goals (Current goals can be found in the Care Plan section)  Acute Rehab PT Goals Patient Stated Goal: to feel better PT Goal Formulation: With patient Time For Goal Achievement: 11/01/18 Potential to Achieve Goals: Fair    Frequency Min 2X/week   Barriers to discharge Decreased caregiver support      Co-evaluation               AM-PAC PT "6 Clicks" Mobility  Outcome Measure Help needed turning from your back to your side while in a flat bed without using bedrails?: A Little Help needed moving from lying on your back to sitting on the side of a flat bed without using bedrails?: A Lot Help needed moving to and from a bed to a chair (including a wheelchair)?: A Lot Help needed standing up from a chair using your arms (e.g., wheelchair or bedside chair)?: A Lot Help needed to walk in hospital room?: A Lot Help needed climbing 3-5 steps with a railing? : A Lot 6 Click Score: 13    End of Session   Activity Tolerance: (limited by nausea/general malaise) Patient left: in bed;with call bell/phone within reach;with bed alarm set;with nursing/sitter in room Nurse Communication: Mobility status PT Visit Diagnosis: Muscle weakness (generalized) (M62.81);Difficulty in walking, not elsewhere classified (R26.2)    Time: 6213-0865 PT Time Calculation (min) (ACUTE ONLY): 30 min   Charges:   PT Evaluation $PT Eval Moderate Complexity: 1 Mod PT Treatments $Therapeutic Exercise: 8-22 mins       Darlen Gledhill H. Owens Shark, PT, DPT, NCS 10/18/18, 2:02 PM [phone]

## 2018-10-18 NOTE — Progress Notes (Signed)
Initial Nutrition Assessment  DOCUMENTATION CODES:   Not applicable  INTERVENTION:   MVI daily  B12 1000 mcg po daily   NUTRITION DIAGNOSIS:   Inadequate oral intake related to acute illness as evidenced by per patient/family report  GOAL:   Patient will meet greater than or equal to 90% of their needs  MONITOR:   PO intake, Supplement acceptance, Labs, Weight trends, Skin, I & O's  REASON FOR ASSESSMENT:   Consult Diet education  ASSESSMENT:   85 year old elderly female patient with history of diabetes mellitus type 2, hypertension, TIA in the past, pulmonary hypertension, diastolic heart failure currently under hospitalist service for palpitations and dizziness  RD received consult for CHF education. Visited pt's room today. Pt actively vomiting at time of RD visit. Pt asked RD to please come back another day. Pt documented to be eating 100% of meals in hospital; pt ate 100% of her breakfast today. Per chart, pt appears weight stable pta; pt's UBW appears to be ~210lbs. Unsure if pt's last two weights are accurate as they are significantly lower than her UBW. RD will obtain nutrition related history and provide CHF education at follow-up. Of note, pt noted to have severely low B12 in October; recommend supplementation. Pt also noted to have low iron; pt receiving ferrous sulfate.   Medications reviewed and include: aspirin, ferrous sulfate, lasix, insulin, synthroid, metformin, protonix, KCl, ceftriaxone, heparin    Labs reviewed: BUN 24(H), creat 1.03(H) Iron 40(L), TIBC 374, ferritin 18- 12/10 B12 <50(L)- 10/18  NUTRITION - FOCUSED PHYSICAL EXAM:    Most Recent Value  Orbital Region  No depletion  Upper Arm Region  No depletion  Thoracic and Lumbar Region  No depletion  Buccal Region  No depletion  Temple Region  No depletion  Clavicle Bone Region  No depletion  Clavicle and Acromion Bone Region  No depletion  Scapular Bone Region  No depletion  Dorsal Hand  No  depletion  Patellar Region  No depletion  Anterior Thigh Region  No depletion  Posterior Calf Region  No depletion  Edema (RD Assessment)  None  Hair  Reviewed  Eyes  Reviewed  Mouth  Reviewed  Skin  Reviewed  Nails  Reviewed     Diet Order:   Diet Order            Diet heart healthy/carb modified Room service appropriate? Yes; Fluid consistency: Thin  Diet effective now             EDUCATION NEEDS:   Not appropriate for education at this time  Skin:  Skin Assessment: Reviewed RN Assessment  Last BM:  1/29  Height:   Ht Readings from Last 1 Encounters:  10/17/18 5\' 1"  (1.549 m)    Weight:   Wt Readings from Last 1 Encounters:  10/18/18 62.9 kg    Ideal Body Weight:  47.7 kg  BMI:  Body mass index is 26.22 kg/m.  Estimated Nutritional Needs:   Kcal:  1500-1800kcal/day   Protein:  95-105g/day   Fluid:  >1.2L/day   Koleen Distance MS, RD, LDN Pager #- [phone] Office#- [phone] After Hours Pager: [phone]

## 2018-10-18 NOTE — Progress Notes (Signed)
Progress Note  Patient Name: RUSSIE GULLEDGE Date of Encounter: 10/18/2018  Primary Cardiologist: Othmar [facility]  Subjective   Feels some palpitations in her chest this morning Heart rate relatively well controlled at rest in the 70s, remains in flutter With exertion heart rate greater than 100, even up to 130 but quickly goes back to 70s at rest. Mild shortness of breath this morning  Inpatient Medications    Scheduled Meds: . aspirin EC  81 mg Oral Daily  . ferrous sulfate  325 mg Oral Q breakfast  . furosemide  40 mg Oral Daily  . insulin aspart  0-5 Units Subcutaneous QHS  . insulin aspart  0-9 Units Subcutaneous TID WC  . insulin glargine  17 Units Subcutaneous Daily  . levothyroxine  75 mcg Oral QAC breakfast  . metFORMIN  1,000 mg Oral BID WC  . metoprolol succinate  25 mg Oral BID  . pantoprazole  40 mg Oral Daily  . potassium chloride  10 mEq Oral Daily  . simvastatin  40 mg Oral QPM   Continuous Infusions: . cefTRIAXone (ROCEPHIN)  IV    . diltiazem (CARDIZEM) infusion 7.5 mg/hr (10/18/18 1035)  . heparin 1,000 Units/hr (10/18/18 0308)   PRN Meds: acetaminophen **OR** acetaminophen, ondansetron **OR** ondansetron (ZOFRAN) IV, polyethylene glycol, temazepam   Vital Signs    Vitals:   10/17/18 1950 10/18/18 0353 10/18/18 0933 10/18/18 1038  BP: 105/75 105/60 (!) 92/46 (!) 91/59  Pulse: (!) 146 73 81 (!) 107  Resp:  18 18   Temp:  97.8 F (36.6 C) 97.7 F (36.5 C)   TempSrc:  Oral    SpO2:  94% 94%   Weight:  62.9 kg    Height:        Intake/Output Summary (Last 24 hours) at 10/18/2018 1153 Last data filed at 10/18/2018 0944 Gross per 24 hour  Intake 386.1 ml  Output 0 ml  Net 386.1 ml   Last 3 Weights 10/18/2018 10/17/2018 10/17/2018  Weight (lbs) 138 lb 12.5 oz 140 lb 1.6 oz 205 lb  Weight (kg) 62.95 kg 63.549 kg 92.987 kg      Telemetry    Atrial flutter with ventricular rate 70s.  With exertion rate up to 130s but back down to the 70s relatively  quickly- Personally Reviewed  ECG     - Personally Reviewed  Physical Exam   GEN: No acute distress.  Mild shortness of breath Neck: No JVD Cardiac: RRR, no murmurs, rubs, or gallops.  Respiratory: Clear to auscultation bilaterally. GI: Soft, nontender, non-distended  MS: No edema; No deformity. Neuro:  Nonfocal  Psych: Normal affect   Labs    Chemistry Recent Labs  Lab 10/17/18 1440 10/18/18 0442  NA 136 138  K 4.7 4.0  CL 103 105  CO2 24 26  GLUCOSE 167* 144*  BUN 25* 24*  CREATININE 1.15* 1.03*  CALCIUM 8.8* 8.6*  GFRNONAA 44* 50*  GFRAA 51* 58*  ANIONGAP 9 7     Hematology Recent Labs  Lab 10/17/18 1440 10/18/18 0442  WBC 10.5 8.8  RBC 4.85 4.36  HGB 12.8 11.6*  HCT 41.1 36.6  MCV 84.7 83.9  MCH 26.4 26.6  MCHC 31.1 31.7  RDW 18.6* 18.3*  PLT 266 233    Cardiac Enzymes Recent Labs  Lab 10/17/18 1440 10/17/18 1708 10/17/18 2249 10/18/18 0442  TROPONINI 0.05* 0.05* 0.05* 0.05*   No results for input(s): TROPIPOC in the last 168 hours.   BNP Recent Labs  Lab 10/17/18 1440  BNP 728.0*     DDimer No results for input(s): DDIMER in the last 168 hours.   Radiology    Dg Chest Port 1 View  Result Date: 10/17/2018 CLINICAL DATA:  Dizziness for 1 week, tachycardia, history diabetes mellitus, CHF EXAM: PORTABLE CHEST 1 VIEW COMPARISON:  Portable exam 1536 hours compared to 07/09/2018 Correlation: CT chest 07/05/2018 FINDINGS: Enlargement of cardiac silhouette. Inferior mediastinal density consistent with hiatal hernia as identified on a prior CT. Mediastinal contours and pulmonary vascularity otherwise normal. Lungs clear. No pleural effusion or pneumothorax. Bones demineralized. IMPRESSION: Enlargement of cardiac silhouette. Hiatal hernia. No acute abnormalities. Electronically Signed   By: Lavonia Dana M.D.   On: 10/17/2018 16:03    Cardiac Studies   Echocardiogram July 06, 2018 - Left ventricle: The cavity size was normal. Systolic  function was   normal. The estimated ejection fraction was in the range of 60%   to 65%. Wall motion was normal; there were no regional wall   motion abnormalities. Features are consistent with a pseudonormal   left ventricular filling pattern, with concomitant abnormal   relaxation and increased filling pressure (grade 2 diastolic   dysfunction). - Mitral valve: Calcified annulus. Mildly thickened, mildly   calcified leaflets . There was mild regurgitation. - Left atrium: The atrium was mildly to moderately dilated. - Tricuspid valve: There was moderate regurgitation. - Pulmonary arteries: Systolic pressure was moderately increased.   PA peak pressure: 52 mm Hg (S).  Patient Profile     MALAYZIA LAFORTE is a 85 y.o. female with a hx of  diabetes,  obesity,  Hypertension, hyperlipidemia,  gait instability chronic leg weakness tachycardia  Heavy coronary calcification on CT scan who presents to the hospital with tachycardia, dizziness, noted to be in atrial flutter with rapid ventricular rate Symptoms started 4 days ago  Assessment & Plan    1. Atrial flutter with RVR Timing of onset likely this past week 4 days ago given acute onset of palpitations, dizziness, weakness She has responded well to Cardizem infusion with oral metoprolol Initially required up to 15 mg/h of Cardizem now weaning off the Cardizem infusion --Recommend we continue metoprolol 25 twice daily Will start diltiazem 30 every 6 with hold parameters If blood pressure runs low could wean down on the diltiazem and add low-dose digoxin -On heparin infusion which we will change to Eliquis 5 twice daily --Given her age, less appealing candidate for transesophageal echo and cardioversion.  For now we will try rate control as she is asymptomatic.  Consider outpatient cardioversion in 3 to 4 weeks  2) hypertension Continue metoprolol, low-dose diltiazem Holding losartan  3)Pulmonary edema  pulmonary hypertension on  last echocardiogram Worsening symptoms in the setting of atrial flutter with RVR Lasix 40 mg oral dosing twice daily for day or 2 then hopefully down to Lasix 40 daily  3) diabetes type 2 Management per primary care  4) pulmonary hypertension Moderately elevated pressures on echocardiogram 3 months ago BNP elevated 700 Lasix 40 oral dosing twice daily If inadequate diuresis may need to change to IV   Total encounter time more than 25 minutes  Greater than 50% was spent in counseling and coordination of care with the patient  For questions or updates, please contact [facility] Please consult [email or web address] for contact info under        Signed, Ida Rogue, MD  10/18/2018, 11:53 AM

## 2018-10-18 NOTE — Progress Notes (Signed)
HR remains a flutter 140s/ sys BP remains in 90s/ pt asymptomatic/ Dr. Rockey Situ aware/ orders to give 2nd IV push of dig/ will continue to monitor.

## 2018-10-18 NOTE — Care Management Note (Addendum)
Case Management Note  Patient Details  Name: Diane Mullins MRN: [id number] Date of Birth: [birth year]  Subjective/Objective:   Patient is from [facility].  She has been admitted for atrial flutter; vertigo.  Sent from PCP office.  She was seen by PT today and unable to sit up without getting nauseous.  Has a walker at home.  Denies difficulties obtaining medications through mail order.   Currently on room air; no oxygen at home.  If patient discharges to SNF her preferences ar [facility].  Randall Hiss, CSW is aware.              Action/Plan:   Expected Discharge Date:                  Expected Discharge Plan:  [place]  In-House Referral:     Discharge planning Services  CM Consult, HF Clinic  Post Acute Care Choice:    Choice offered to:     DME Arranged:    DME Agency:     HH Arranged:    HH Agency:     Status of Service:  In process, will continue to follow  If discussed at Long Length of Stay Meetings, dates discussed:    Additional Comments:  Elza Rafter, RN 10/18/2018, 4:19 PM

## 2018-10-18 NOTE — Progress Notes (Signed)
ANTICOAGULATION CONSULT NOTE - Initial Consult  Pharmacy Consult for Heparin drip Indication: atrial fibrillation  Allergies  Allergen Reactions  . Oxycodone     Other reaction(s): Confusion Patient's daughter reported confusion/sensitivity to oxycodone.     Patient Measurements: Height: 5\' 1"  (154.9 cm) Weight: 140 lb 1.6 oz (63.5 kg) IBW/kg (Calculated) : 47.8 Heparin Dosing Weight: 69.7 kg  Vital Signs: Temp: 97.7 F (36.5 C) (01/30 1932) Temp Source: Oral (01/30 1932) BP: 105/75 (01/30 1950) Pulse Rate: 146 (01/30 1950)  Labs: Recent Labs    10/17/18 1440 10/17/18 1703 10/17/18 1708 10/17/18 2249 10/18/18 0207  HGB 12.8  --   --   --   --   HCT 41.1  --   --   --   --   PLT 266  --   --   --   --   APTT  --  24  --   --   --   LABPROT  --  11.8  --   --   --   INR  --  0.87  --   --   --   HEPARINUNFRC  --   --   --   --  0.25*  CREATININE 1.15*  --   --   --   --   TROPONINI 0.05*  --  0.05* 0.05*  --     Estimated Creatinine Clearance: 31.1 mL/min (A) (by C-G formula based on SCr of 1.15 mg/dL (H)).   Medical History: Past Medical History:  Diagnosis Date  . Benign paroxysmal positional vertigo 07/17/2014  . Chronic diastolic CHF (congestive heart failure) ([place])    a. echo 10/19: EF of 60-65%, no RWMA, Gr2DD, calcified mitral annulus with mild MR, mildly to moderately dilated left atrium, moderate TR, PASP 52 mmHg  . Diabetes mellitus   . H/O: rheumatic fever   . Hyperlipidemia   . Hypertension   . Pulmonary hypertension ([place])   . Stroke [facility])    TIA Jan. 1st  . Teratoma of left ovary   . Thyroid disease     Medications:  Scheduled:  . aspirin EC  81 mg Oral Daily  . ferrous sulfate  325 mg Oral Q breakfast  . furosemide  20 mg Oral Daily  . heparin  900 Units Intravenous Once  . insulin aspart  0-5 Units Subcutaneous QHS  . insulin aspart  0-9 Units Subcutaneous TID WC  . insulin glargine  17 Units Subcutaneous Daily  . levothyroxine   75 mcg Oral QAC breakfast  . metFORMIN  1,000 mg Oral BID WC  . metoprolol succinate  25 mg Oral BID  . pantoprazole  40 mg Oral Daily  . potassium chloride  10 mEq Oral Daily  . simvastatin  40 mg Oral QPM   Infusions:  . diltiazem (CARDIZEM) infusion 15 mg/hr (10/17/18 2302)  . heparin 850 Units/hr (10/17/18 1747)    Assessment: 85 yo F to start Heparin drip for new Afib. Patient on ASA at home. Hgb 12.8  Plt 266  INR 0.87  APTT 24   Goal of Therapy:  Heparin level 0.3-0.7 units/ml Monitor platelets by anticoagulation protocol: Yes   Plan:  01/31 @ 0200 HL 0.25 subtherapeutic. Will rebolus w/ heparin 900 units IV x 1 and increase rate to 1000 units/hr and will recheck HL @ 1100. Baseline labs WNL, F/u CBC w/ am labs.  Tobie Lords, PharmD, BCPS Clinical Pharmacist 10/18/2018

## 2018-10-18 NOTE — Progress Notes (Signed)
pts BP low/ pt asymptomatic/ Dr. Rockey Situ aware/ MD to bedside to assess pt/ orders to give lasix and metprolol and start to taper off Cardizem gtt/ will continue to assess.

## 2018-10-18 NOTE — Progress Notes (Signed)
Orders to restart Cardizem gtt at rate of 7.5/ will monitor.

## 2018-10-19 ENCOUNTER — Other Ambulatory Visit: Payer: Self-pay

## 2018-10-19 LAB — BASIC METABOLIC PANEL WITH GFR
Anion gap: 8 (ref 5–15)
BUN: 21 mg/dL (ref 8–23)
CO2: 28 mmol/L (ref 22–32)
Calcium: 8.6 mg/dL — ABNORMAL LOW (ref 8.9–10.3)
Chloride: 102 mmol/L (ref 98–111)
Creatinine, Ser: 1.07 mg/dL — ABNORMAL HIGH (ref 0.44–1.00)
GFR calc Af Amer: 55 mL/min — ABNORMAL LOW (ref >60)
GFR calc non Af Amer: 48 mL/min — ABNORMAL LOW (ref >60)
Glucose, Bld: 128 mg/dL — ABNORMAL HIGH (ref 70–99)
Potassium: 4.3 mmol/L (ref 3.5–5.1)
Sodium: 138 mmol/L (ref 135–145)

## 2018-10-19 LAB — CBC
HCT: 39.8 % (ref 36.0–46.0)
Hemoglobin: 12.4 g/dL (ref 12.0–15.0)
MCH: 26.7 pg (ref 26.0–34.0)
MCHC: 31.2 g/dL (ref 30.0–36.0)
MCV: 85.6 fL (ref 80.0–100.0)
Platelets: 243 10*3/uL (ref 150–400)
RBC: 4.65 MIL/uL (ref 3.87–5.11)
RDW: 18.2 % — ABNORMAL HIGH (ref 11.5–15.5)
WBC: 10.9 10*3/uL — ABNORMAL HIGH (ref 4.0–10.5)
nRBC: 0 % (ref 0.0–0.2)

## 2018-10-19 LAB — MRSA PCR SCREENING: MRSA by PCR: NEGATIVE

## 2018-10-19 LAB — GLUCOSE, CAPILLARY
Glucose-Capillary: 104 mg/dL — ABNORMAL HIGH (ref 70–99)
Glucose-Capillary: 158 mg/dL — ABNORMAL HIGH (ref 70–99)
Glucose-Capillary: 121 mg/dL — ABNORMAL HIGH (ref 70–99)
Glucose-Capillary: 153 mg/dL — ABNORMAL HIGH (ref 70–99)

## 2018-10-19 LAB — URINE CULTURE

## 2018-10-19 LAB — HEPARIN LEVEL (UNFRACTIONATED): Heparin Unfractionated: 0.44 IU/mL (ref 0.30–0.70)

## 2018-10-19 LAB — DIGOXIN LEVEL: Digoxin Level: 1.2 ng/mL (ref 0.8–2.0)

## 2018-10-19 MED ORDER — CARVEDILOL 3.125 MG PO TABS
3.1250 mg | ORAL_TABLET | Freq: Two times a day (BID) | ORAL | Status: DC
  Administered 2018-10-19 – 2018-10-20 (×2): 3.125 mg via ORAL
  Filled 2018-10-19: qty 1

## 2018-10-19 MED ORDER — DILTIAZEM HCL 25 MG/5ML IV SOLN
5.0000 mg | Freq: Once | INTRAVENOUS | Status: AC
  Administered 2018-10-19: 5 mg via INTRAVENOUS
  Filled 2018-10-19: qty 5

## 2018-10-19 MED ORDER — METOCLOPRAMIDE HCL 5 MG/ML IJ SOLN
10.0000 mg | Freq: Four times a day (QID) | INTRAMUSCULAR | Status: DC
  Administered 2018-10-19 – 2018-10-28 (×33): 10 mg via INTRAVENOUS
  Filled 2018-10-19 (×34): qty 2

## 2018-10-19 MED ORDER — APIXABAN 5 MG PO TABS
5.0000 mg | ORAL_TABLET | Freq: Two times a day (BID) | ORAL | Status: DC
  Administered 2018-10-19 – 2018-10-28 (×19): 5 mg via ORAL
  Filled 2018-10-19 (×19): qty 1

## 2018-10-19 MED ORDER — DILTIAZEM LOAD VIA INFUSION
5.0000 mg | Freq: Once | INTRAVENOUS | Status: DC
  Filled 2018-10-19: qty 5

## 2018-10-19 MED ORDER — SODIUM CHLORIDE 0.9% FLUSH
10.0000 mL | Freq: Two times a day (BID) | INTRAVENOUS | Status: DC
  Administered 2018-10-19 – 2018-10-28 (×16): 10 mL via INTRAVENOUS

## 2018-10-19 MED ORDER — SODIUM CHLORIDE 0.9 % IV SOLN
INTRAVENOUS | Status: DC | PRN
  Administered 2018-10-19: 500 mL via INTRAVENOUS

## 2018-10-19 MED ORDER — DILTIAZEM HCL ER COATED BEADS 180 MG PO CP24
180.0000 mg | ORAL_CAPSULE | Freq: Every day | ORAL | Status: DC
  Administered 2018-10-19: 180 mg via ORAL
  Filled 2018-10-19: qty 1

## 2018-10-19 NOTE — Progress Notes (Signed)
ANTICOAGULATION CONSULT NOTE - Initial Consult  Pharmacy Consult for Heparin drip Indication: atrial fibrillation  Allergies  Allergen Reactions  . Oxycodone     Other reaction(s): Confusion Patient's daughter reported confusion/sensitivity to oxycodone.     Patient Measurements: Height: 5\' 1"  (154.9 cm) Weight: 138 lb 12.5 oz (62.9 kg) IBW/kg (Calculated) : 47.8 Heparin Dosing Weight: 69.7 kg  Vital Signs: Temp: 98.4 F (36.9 C) (02/01 0417) Temp Source: Oral (01/31 2041) BP: 128/73 (02/01 0417) Pulse Rate: 75 (02/01 0417)  Labs: Recent Labs    10/17/18 1440 10/17/18 1703 10/17/18 1708 10/17/18 2249  10/18/18 0442 10/18/18 1056 10/18/18 1923 10/19/18 0528  HGB 12.8  --   --   --   --  11.6*  --   --  12.4  HCT 41.1  --   --   --   --  36.6  --   --  39.8  PLT 266  --   --   --   --  233  --   --  243  APTT  --  24  --   --   --   --   --   --   --   LABPROT  --  11.8  --   --   --   --   --   --   --   INR  --  0.87  --   --   --   --   --   --   --   HEPARINUNFRC  --   --   --   --    < >  --  0.38 0.42 0.44  CREATININE 1.15*  --   --   --   --  1.03*  --   --  1.07*  TROPONINI 0.05*  --  0.05* 0.05*  --  0.05*  --   --   --    < > = values in this interval not displayed.    Estimated Creatinine Clearance: 33.3 mL/min (A) (by C-G formula based on SCr of 1.07 mg/dL (H)).   Medical History: Past Medical History:  Diagnosis Date  . Benign paroxysmal positional vertigo 07/17/2014  . Chronic diastolic CHF (congestive heart failure) ([place])    a. echo 10/19: EF of 60-65%, no RWMA, Gr2DD, calcified mitral annulus with mild MR, mildly to moderately dilated left atrium, moderate TR, PASP 52 mmHg  . Diabetes mellitus   . H/O: rheumatic fever   . Hyperlipidemia   . Hypertension   . Pulmonary hypertension ([place])   . Stroke [facility])    TIA Jan. 1st  . Teratoma of left ovary   . Thyroid disease     Medications:  Scheduled:  . aspirin EC  81 mg Oral Daily  .  diltiazem  30 mg Oral Q6H  . ferrous sulfate  325 mg Oral Q breakfast  . furosemide  40 mg Oral BID  . insulin aspart  0-5 Units Subcutaneous QHS  . insulin aspart  0-9 Units Subcutaneous TID WC  . insulin glargine  17 Units Subcutaneous Daily  . levothyroxine  75 mcg Oral QAC breakfast  . metFORMIN  1,000 mg Oral BID WC  . metoprolol succinate  25 mg Oral BID  . multivitamin with minerals  1 tablet Oral Daily  . pantoprazole  40 mg Oral Daily  . potassium chloride  10 mEq Oral Daily  . simvastatin  40 mg Oral QPM  . vitamin B-12  1,000 mcg  Oral Daily   Infusions:  . cefTRIAXone (ROCEPHIN)  IV 1 g (10/18/18 1320)  . diltiazem (CARDIZEM) infusion 7.5 mg/hr (10/18/18 1911)  . diltiazem (CARDIZEM) infusion    . heparin 1,000 Units/hr (10/18/18 1616)    Assessment: 85 yo F to start Heparin drip for new Afib. Patient on ASA at home. Hgb 12.8  Plt 266  INR 0.87  APTT 24  01/31 @ 0200 HL 0.25  rebolus w/ heparin 900 units IV x 1 and increase rate to 1000 units/hr 01/31 @ 1056 HL 0.38 continue current rate.  01/31 @ 1923 HL 0.42 continue current rate 02/01 @ 0528 HL 0.44   Goal of Therapy:  Heparin level 0.3-0.7 units/ml Monitor platelets by anticoagulation protocol: Yes   Plan:  2/1 Heparin level remains therapeutic. Will continue current infusion rate of @ 1000u/hr. Recheck HL and CBC w/ am labs daily per protocol.   Pernell Dupre, PharmD, BCPS Clinical Pharmacist 10/19/2018 6:06 AM

## 2018-10-19 NOTE — Clinical Social Work Note (Signed)
Clinical Social Work Assessment  Patient Details  Name: Diane Mullins MRN: [id number] Date of Birth: June 14, 1933  Date of referral:  10/19/18               Reason for consult:  Facility Placement                Permission sought to share information with:  Facility Sport and exercise psychologist Permission granted to share information::  Yes, Verbal Permission Granted  Name::     [place] Daughter [phone] or Tripp,Lindsay Granddaughter [phone]   Agency::  SNF admissions  Relationship::     Contact Information:     Housing/Transportation Living arrangements for the past 2 months:  Independent Living Facility([facility]) Source of Information:  Patient Patient Interpreter Needed:  None Criminal Activity/Legal Involvement Pertinent to Current Situation/Hospitalization:  No - Comment as needed Significant Relationships:  Adult Children Lives with:  [place]) Do you feel safe going back to the place where you live?  No Need for family participation in patient care:  No (Coment)  Care giving concerns:  Patient feels she needs to go to SNF for short term rehab.   Social Worker assessment / plan:  Patient is an 85 year old female who is alert and oriented x4.  Patient [facility] a resident at [facility].  Patient was explained role of CSW and process for looking for placement.  Patient states she has been to SNF in the past, and would prefer to go to [facility] or [facility].  CSW explained to her it will depend on if beds are available, and if insurance will approve her for SNF.  Patient expressed understanding.  Patient expressed that she is familiar with the process of going to SNF, and knows what to expect.  Patient gave CSW permission to begin bed search in [place].  Patient did not have any other questions or concerns.  Employment status:  Retired Office manager PT Recommendations:  [place] / Referral to community resources:  [place]  Patient/Family's Response to care:  Patient is in agreement to going to SNF for short term rehab.  Patient/Family's Understanding of and Emotional Response to Diagnosis, Current Treatment, and Prognosis:  Patient stated that she would prefer to return back home, but is agreeable to going to SNF.  Emotional Assessment Appearance:  Appears stated age Attitude/Demeanor/Rapport:    Affect (typically observed):  Appropriate, Stable, Calm Orientation:  Oriented to Self, Oriented to Place, Oriented to  Time, Oriented to Situation Alcohol / Substance use:  Not Applicable Psych involvement (Current and /or in the community):  No (Comment)  Discharge Needs  Concerns to be addressed:  Care Coordination Readmission within the last 30 days:  No Current discharge risk:  Lack of support system, Lives alone Barriers to Discharge:  Continued Medical Work up, [employer]   Ross Ludwig, [place] 10/19/2018, 6:30 PM

## 2018-10-19 NOTE — NC FL2 (Signed)
[place] LEVEL OF CARE SCREENING TOOL     IDENTIFICATION  Patient Name: Diane Mullins Birthdate: 1932/11/18 Sex: female Admission Date (Current Location): 10/17/2018  [place] and [place] Number:  Engineering geologist and Address:  [facility], [address] [place], [place] [address]      Provider Number: [id number]  Attending Physician Name and Address:  Gorden Harms, MD  Relative Name and Phone Number:  [name] Daughter [phone] or Tripp,Lindsay Granddaughter [phone]     Current Level of Care: Hospital Recommended Level of Care: [place] Prior Approval Number:    Date Approved/Denied:   PASRR Number: [id number]  Discharge Plan: SNF    Current Diagnoses: Patient Active Problem List   Diagnosis Date Noted  . Atrial flutter ([place]) 10/17/2018  . 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 AKI (acute kidney injury) ([place])   . Acute on chronic diastolic heart failure ([place])   . Respiratory failure ([place]) 07/05/2018  . Paroxysmal tachycardia ([place]) 11/16/2017  . Coronary artery calcification seen on CAT scan 11/16/2017  . Morbid obesity ([place]) 10/30/2017  . (HFpEF) heart failure with preserved ejection fraction ([place]) 09/28/2017  . Family history of colon cancer 04/17/2017  . Closed right ankle fracture 10/28/2015  . Fatigue 10/18/2015  . TIA (transient ischemic attack) 09/18/2015  . Orthostatic hypotension 08/02/2015  . Adjustment disorder with anxious mood 03/30/2015  . Postmenopausal estrogen deficiency 06/03/2014  . DNR (do not resuscitate) discussion 03/04/2014  . Gait disturbance 05/07/2013  . Diabetes ([place]) 04/21/2013  . Shortness of breath 12/09/2012  . Hearing loss 12/09/2012  . Anxiety 01/31/2012  . Hypertension 12/22/2011  . Hyperlipidemia 12/22/2011  . Hypothyroidism 12/22/2011    Orientation RESPIRATION BLADDER Height & Weight     Self, Time,  Situation, Place  Normal Continent Weight: 203 lb 7.8 oz (92.3 kg) Height:  5\' 1"  (154.9 cm)  BEHAVIORAL SYMPTOMS/MOOD NEUROLOGICAL BOWEL NUTRITION STATUS      Continent Diet(Cardiac Carb Modified)  AMBULATORY STATUS COMMUNICATION OF NEEDS Skin   Limited Assist Verbally Normal                       Personal Care Assistance Level of Assistance  Bathing, Feeding, Dressing Bathing Assistance: Limited assistance Feeding assistance: Independent Dressing Assistance: Limited assistance     Functional Limitations Info  Sight, Hearing, Speech Sight Info: Adequate Hearing Info: Impaired Speech Info: Adequate    SPECIAL CARE FACTORS FREQUENCY  PT (By licensed PT), OT (By licensed OT)     PT Frequency: 5x a week OT Frequency: 5x a week            Contractures Contractures Info: Not present    Additional Factors Info  Code Status, Allergies, Insulin Sliding Scale Code Status Info: Full Code Allergies Info: OXYCODONE    Insulin Sliding Scale Info: insulin aspart (novoLOG) injection 0-9 Units 3x a day       Current Medications (10/19/2018):  This is the current hospital active medication list Current Facility-Administered Medications  Medication Dose Route Frequency Provider Last Rate Last Dose  . acetaminophen (TYLENOL) tablet 650 mg  650 mg Oral Q6H PRN Gladstone Lighter, MD   650 mg at 10/18/18 0945   Or  . acetaminophen (TYLENOL) suppository 650 mg  650 mg Rectal Q6H PRN Gladstone Lighter, MD      . apixaban Arne Cleveland) tablet 5 mg  5 mg Oral BID Cyndee Brightly [place], [facility]  5 mg at 10/19/18 1314  . aspirin EC tablet 81 mg  81 mg Oral Daily Gladstone Lighter, MD   81 mg at 10/19/18 0942  . carvedilol (COREG) tablet 3.125 mg  3.125 mg Oral BID WC Salary, Avel Peace, MD   Stopped at 10/19/18 1716  . cefTRIAXone (ROCEPHIN) 1 g in sodium chloride 0.9 % 100 mL IVPB  1 g Intravenous Q24H Pyreddy, [place]avan, MD 200 mL/hr at 10/19/18 1300 1 g at 10/19/18 1300  . diltiazem (CARDIZEM CD)  24 hr capsule 180 mg  180 mg Oral Daily Salary, Montell D, MD   180 mg at 10/19/18 0942  . diltiazem (CARDIZEM) 100 mg in dextrose 5% 124mL (1 mg/mL) infusion  5-15 mg/hr Intravenous Titrated Minna Merritts, MD   Stopped at 10/19/18 1411  . diltiazem (CARDIZEM) 100 mg in dextrose 5% 165mL (1 mg/mL) infusion  5-15 mg/hr Intravenous Continuous Gollan, Kathlene November, MD      . ferrous sulfate tablet 325 mg  325 mg Oral Q breakfast Gladstone Lighter, MD   325 mg at 10/19/18 0941  . furosemide (LASIX) tablet 40 mg  40 mg Oral BID Minna Merritts, MD   40 mg at 10/19/18 1737  . insulin aspart (novoLOG) injection 0-5 Units  0-5 Units Subcutaneous QHS Gladstone Lighter, MD      . insulin aspart (novoLOG) injection 0-9 Units  0-9 Units Subcutaneous TID WC Gladstone Lighter, MD   1 Units at 10/19/18 1736  . insulin glargine (LANTUS) injection 17 Units  17 Units Subcutaneous Daily Gladstone Lighter, MD   17 Units at 10/19/18 [phone]  . levothyroxine (SYNTHROID, LEVOTHROID) tablet 75 mcg  75 mcg Oral QAC breakfast Gladstone Lighter, MD   75 mcg at 10/19/18 [phone]  . metFORMIN (GLUCOPHAGE) tablet 1,000 mg  1,000 mg Oral BID WC Gladstone Lighter, MD   1,000 mg at 10/19/18 1737  . metoCLOPramide (REGLAN) injection 10 mg  10 mg Intravenous Q6H Salary, Montell D, MD   10 mg at 10/19/18 1746  . metoprolol succinate (TOPROL-XL) 24 hr tablet 25 mg  25 mg Oral BID Minna Merritts, MD   25 mg at 10/19/18 0941  . multivitamin with minerals tablet 1 tablet  1 tablet Oral Daily Saundra Shelling, MD   1 tablet at 10/19/18 0940  . ondansetron (ZOFRAN) tablet 4 mg  4 mg Oral Q6H PRN Gladstone Lighter, MD       Or  . ondansetron (ZOFRAN) injection 4 mg  4 mg Intravenous Q6H PRN Gladstone Lighter, MD   4 mg at 10/18/18 1044  . pantoprazole (PROTONIX) EC tablet 40 mg  40 mg Oral Daily Gladstone Lighter, MD   40 mg at 10/19/18 0943  . polyethylene glycol (MIRALAX / GLYCOLAX) packet 17 g  17 g Oral Daily PRN Gladstone Lighter, MD      . potassium chloride (K-DUR) CR tablet 10 mEq  10 mEq Oral Daily Gladstone Lighter, MD   10 mEq at 10/19/18 0942  . simvastatin (ZOCOR) tablet 40 mg  40 mg Oral QPM Gladstone Lighter, MD   40 mg at 10/19/18 1737  . temazepam (RESTORIL) capsule 15 mg  15 mg Oral QHS PRN Saundra Shelling, MD   15 mg at 10/19/18 0101  . vitamin B-12 (CYANOCOBALAMIN) tablet 1,000 mcg  1,000 mcg Oral Daily Saundra Shelling, MD   1,000 mcg at 10/19/18 0102     Discharge Medications: Please see discharge summary for a list of discharge medications.  Relevant Imaging Results:  Relevant Lab Results:   Additional Information SS# [id number]  Anell Barr

## 2018-10-19 NOTE — Consult Note (Signed)
ANTICOAGULATION CONSULT NOTE - Initial Consult  Pharmacy Consult for Apixaban Indication: atrial fibrillation  Allergies  Allergen Reactions  . Oxycodone     Other reaction(s): Confusion Patient's daughter reported confusion/sensitivity to oxycodone.     Patient Measurements: Height: 5\' 1"  (154.9 cm) Weight: 203 lb 7.8 oz (92.3 kg) IBW/kg (Calculated) : 47.8   Vital Signs: Temp: 97.3 F (36.3 C) (02/01 0744) Temp Source: Oral (02/01 0744) BP: 125/71 (02/01 0900) Pulse Rate: 139 (02/01 0900)  Labs: Recent Labs    10/17/18 1440 10/17/18 1703 10/17/18 1708 10/17/18 2249  10/18/18 0442 10/18/18 1056 10/18/18 1923 10/19/18 0528  HGB 12.8  --   --   --   --  11.6*  --   --  12.4  HCT 41.1  --   --   --   --  36.6  --   --  39.8  PLT 266  --   --   --   --  233  --   --  243  APTT  --  24  --   --   --   --   --   --   --   LABPROT  --  11.8  --   --   --   --   --   --   --   INR  --  0.87  --   --   --   --   --   --   --   HEPARINUNFRC  --   --   --   --    < >  --  0.38 0.42 0.44  CREATININE 1.15*  --   --   --   --  1.03*  --   --  1.07*  TROPONINI 0.05*  --  0.05* 0.05*  --  0.05*  --   --   --    < > = values in this interval not displayed.    Estimated Creatinine Clearance: 40.5 mL/min (A) (by C-G formula based on SCr of 1.07 mg/dL (H)).   Medical History: Past Medical History:  Diagnosis Date  . Benign paroxysmal positional vertigo 07/17/2014  . Chronic diastolic CHF (congestive heart failure) ([place])    a. echo 10/19: EF of 60-65%, no RWMA, Gr2DD, calcified mitral annulus with mild MR, mildly to moderately dilated left atrium, moderate TR, PASP 52 mmHg  . Diabetes mellitus   . H/O: rheumatic fever   . Hyperlipidemia   . Hypertension   . Pulmonary hypertension ([place])   . Stroke [facility])    TIA Jan. 1st  . Teratoma of left ovary   . Thyroid disease     Medications:  Scheduled:  . aspirin EC  81 mg Oral Daily  . carvedilol  3.125 mg Oral BID WC  .  diltiazem  180 mg Oral Daily  . ferrous sulfate  325 mg Oral Q breakfast  . furosemide  40 mg Oral BID  . insulin aspart  0-5 Units Subcutaneous QHS  . insulin aspart  0-9 Units Subcutaneous TID WC  . insulin glargine  17 Units Subcutaneous Daily  . levothyroxine  75 mcg Oral QAC breakfast  . metFORMIN  1,000 mg Oral BID WC  . metoprolol succinate  25 mg Oral BID  . multivitamin with minerals  1 tablet Oral Daily  . pantoprazole  40 mg Oral Daily  . potassium chloride  10 mEq Oral Daily  . simvastatin  40 mg Oral QPM  . vitamin  B-12  1,000 mcg Oral Daily    Assessment: 85 yo female with new afib. Was not on any PTA anticoagulants. Is transitioning from heparin to apixaban.   Goal of Therapy:  Monitor platelets by anticoagulation protocol: Yes   Plan:  Will order Apixaban 5 mg PO BID. Nurse aware to start apixaban when heparin stopped. Plan to counsel patient.   Paticia Stack, PharmD Pharmacy Resident  10/19/2018 11:40 AM

## 2018-10-19 NOTE — Progress Notes (Signed)
Family Meeting Note  Advance Directive:yes  Today a meeting took place with the Patient.  Patient is able to participate  The following clinical team members were present during this meeting:MD  The following were discussed:Patient's diagnosis: Atrial flutter, heart failure, UTI, Patient's progosis: Unable to determine and Goals for treatment: Full Code  Additional follow-up to be provided: prn  Time spent during discussion:20 minutes  Gorden Harms, MD

## 2018-10-19 NOTE — Progress Notes (Addendum)
[place] at [place] NAME: Diane Mullins    MR#:  [id number]  DATE OF BIRTH:  [birth year]  SUBJECTIVE:  Patient is feeling better, no complaints, cardiology input appreciated, follow-up on urine culture, start Coreg and Cardizem CD, wean Cardizem drip off as tolerated  REVIEW OF SYSTEMS:    ROS  CONSTITUTIONAL: No documented fever. No fatigue, weakness. No weight gain, no weight loss.  EYES: No blurry or double vision.  ENT: No tinnitus. No postnasal drip. No redness of the oropharynx.  RESPIRATORY: No cough, no wheeze, no hemoptysis. No dyspnea.  CARDIOVASCULAR: No chest pain. No orthopnea. Had palpitations. No syncope.  GASTROINTESTINAL: No nausea, no vomiting or diarrhea. No abdominal pain. No melena or hematochezia.  GENITOURINARY: No dysuria or hematuria.  ENDOCRINE: No polyuria or nocturia. No heat or cold intolerance.  HEMATOLOGY: No anemia. No bruising. No bleeding.  INTEGUMENTARY: No rashes. No lesions.  MUSCULOSKELETAL: No arthritis. No swelling. No gout.  NEUROLOGIC: No numbness, tingling, or ataxia. No seizure-type activity.  PSYCHIATRIC: No anxiety. No insomnia. No ADD.   DRUG ALLERGIES:   Allergies  Allergen Reactions  . Oxycodone     Other reaction(s): Confusion Patient's daughter reported confusion/sensitivity to oxycodone.     VITALS:  Blood pressure 125/71, pulse (!) 139, temperature (!) 97.3 F (36.3 C), temperature source Oral, resp. rate 18, height 5\' 1"  (1.549 m), weight 92.3 kg, SpO2 94 %.  PHYSICAL EXAMINATION:   Physical Exam  GENERAL:  85 y.o.-year-old patient lying in the bed with no acute distress.  EYES: Pupils equal, round, reactive to light and accommodation. No scleral icterus. Extraocular muscles intact.  HEENT: Head atraumatic, normocephalic. Oropharynx and nasopharynx clear.  NECK:  Supple, no jugular venous distention. No thyroid enlargement, no tenderness.  LUNGS: Normal breath sounds  bilaterally, no wheezing, rales, rhonchi. No use of accessory muscles of respiration.  CARDIOVASCULAR: S1, S2 irregular. No murmurs, rubs, or gallops.  ABDOMEN: Soft, nontender, nondistended. Bowel sounds present. No organomegaly or mass.  EXTREMITIES: No cyanosis, clubbing or edema b/l.    NEUROLOGIC: Cranial nerves II through XII are intact. No focal Motor or sensory deficits b/l.   PSYCHIATRIC: The patient is alert and oriented x 3.  SKIN: No obvious rash, lesion, or ulcer.   LABORATORY PANEL:   CBC Recent Labs  Lab 10/19/18 0528  WBC 10.9*  HGB 12.4  HCT 39.8  PLT 243   ------------------------------------------------------------------------------------------------------------------ Chemistries  Recent Labs  Lab 10/19/18 0528  NA 138  K 4.3  CL 102  CO2 28  GLUCOSE 128*  BUN 21  CREATININE 1.07*  CALCIUM 8.6*   ------------------------------------------------------------------------------------------------------------------  Cardiac Enzymes Recent Labs  Lab 10/18/18 0442  TROPONINI 0.05*   ------------------------------------------------------------------------------------------------------------------  RADIOLOGY:  Dg Chest Port 1 View  Result Date: 10/17/2018 CLINICAL DATA:  Dizziness for 1 week, tachycardia, history diabetes mellitus, CHF EXAM: PORTABLE CHEST 1 VIEW COMPARISON:  Portable exam 1536 hours compared to 07/09/2018 Correlation: CT chest 07/05/2018 FINDINGS: Enlargement of cardiac silhouette. Inferior mediastinal density consistent with hiatal hernia as identified on a prior CT. Mediastinal contours and pulmonary vascularity otherwise normal. Lungs clear. No pleural effusion or pneumothorax. Bones demineralized. IMPRESSION: Enlargement of cardiac silhouette. Hiatal hernia. No acute abnormalities. Electronically Signed   By: Lavonia Dana Mullins.   On: 10/17/2018 16:03     ASSESSMENT AND PLAN:  85 year old elderly female patient with history of diabetes  mellitus type 2, hypertension, TIA in the past, pulmonary hypertension, diastolic heart failure  currently under hospitalist service for palpitations and dizziness  *Acute atrial flutter with RVR, new onset Stable Start Cardizem CD 180 mg daily, Coreg, wean Cardizem drip as tolerated, did receive digoxin-level 1.2 currently  cardiology/Dr. Rockey Situ input appreciated Discontinue heparin drip, start Eliquis  Outpatient cardioversion after 4 weeks  *Acute on chronic diastolic heart failure exacerbation   Secondary to above  Stable Continue CHF protocol, Lasix, strict I&O monitoring, daily weights, Coreg  *Chronic diabetes mellitus type 2  Stable on current regiment   *Acute UTI  Stable  Continue empiric Rocephin and follow-up on cultures   *Chronic B12 deficiency Stable Continue vitamin B12  Disposition to independent living facility in 1 to 2 days barring any complications  All the records are reviewed and case discussed with Care Management/Social Worker. Management plans discussed with the patient, family and they are in agreement.  CODE STATUS: Full code  DVT Prophylaxis: SCDs  TOTAL TIME TAKING CARE OF THIS PATIENT: 37 minutes.   POSSIBLE D/C IN 1-2 DAYS, DEPENDING ON CLINICAL CONDITION.  Diane Mullins on 10/19/2018 at 11:25 AM  Between 7am to 6pm - Pager - [phone]  After 6pm go to [email or web address] - password [email or web address] [facility]  Office  [phone]  CC: Primary care physician; Diane Haven, MD  Note: This dictation was prepared with Dragon dictation along with smaller phrase technology. Any transcriptional errors that result from this process are unintentional.

## 2018-10-19 NOTE — Plan of Care (Signed)
  Problem: Clinical Measurements: Goal: Cardiovascular complication will be avoided Outcome: Not Progressing Note:  Patient requiring cardizem gtt to control heart rate.

## 2018-10-19 NOTE — Progress Notes (Signed)
Progress Note  Patient Name: Diane Mullins Date of Encounter: 10/19/2018  Primary Cardiologist:   No primary care provider on file.   Subjective   She feels better than she did yesterday.  Less nausea.  No pain.   Inpatient Medications    Scheduled Meds: . aspirin EC  81 mg Oral Daily  . carvedilol  3.125 mg Oral BID WC  . diltiazem  180 mg Oral Daily  . ferrous sulfate  325 mg Oral Q breakfast  . furosemide  40 mg Oral BID  . insulin aspart  0-5 Units Subcutaneous QHS  . insulin aspart  0-9 Units Subcutaneous TID WC  . insulin glargine  17 Units Subcutaneous Daily  . levothyroxine  75 mcg Oral QAC breakfast  . metFORMIN  1,000 mg Oral BID WC  . metoprolol succinate  25 mg Oral BID  . multivitamin with minerals  1 tablet Oral Daily  . pantoprazole  40 mg Oral Daily  . potassium chloride  10 mEq Oral Daily  . simvastatin  40 mg Oral QPM  . vitamin B-12  1,000 mcg Oral Daily   Continuous Infusions: . cefTRIAXone (ROCEPHIN)  IV 1 g (10/18/18 1320)  . diltiazem (CARDIZEM) infusion 5 mg/hr (10/19/18 7654)  . diltiazem (CARDIZEM) infusion    . heparin 1,000 Units/hr (10/18/18 1616)   PRN Meds: acetaminophen **OR** acetaminophen, ondansetron **OR** ondansetron (ZOFRAN) IV, polyethylene glycol, temazepam   Vital Signs    Vitals:   10/19/18 0056 10/19/18 0417 10/19/18 0500 10/19/18 0744  BP: (!) 117/57 128/73  122/69  Pulse: 72 75  61  Resp:  20  18  Temp:  98.4 F (36.9 C)  (!) 97.3 F (36.3 C)  TempSrc:    Oral  SpO2:  94%  94%  Weight:   92.3 kg   Height:        Intake/Output Summary (Last 24 hours) at 10/19/2018 0931 Last data filed at 10/19/2018 0417 Gross per 24 hour  Intake 120 ml  Output 1250 ml  Net -1130 ml   Filed Weights   10/17/18 1736 10/18/18 0353 10/19/18 0500  Weight: 63.5 kg 62.9 kg 92.3 kg    Telemetry    Atrial flutter currently with 4:1 conduction - Personally Reviewed  ECG    NA - Personally Reviewed  Physical Exam   GEN: No  acute distress.   Neck: No  JVD Cardiac: IrregularRR, no murmurs, rubs, or gallops.  Respiratory:   Decreased breath sounds with few wheezes GI: Soft, nontender, non-distended  MS: No  edema; No deformity. Neuro:  Nonfocal  Psych: Normal affect   Labs    Chemistry Recent Labs  Lab 10/17/18 1440 10/18/18 0442 10/19/18 0528  NA 136 138 138  K 4.7 4.0 4.3  CL 103 105 102  CO2 24 26 28   GLUCOSE 167* 144* 128*  BUN 25* 24* 21  CREATININE 1.15* 1.03* 1.07*  CALCIUM 8.8* 8.6* 8.6*  GFRNONAA 44* 50* 48*  GFRAA 51* 58* 55*  ANIONGAP 9 7 8      Hematology Recent Labs  Lab 10/17/18 1440 10/18/18 0442 10/19/18 0528  WBC 10.5 8.8 10.9*  RBC 4.85 4.36 4.65  HGB 12.8 11.6* 12.4  HCT 41.1 36.6 39.8  MCV 84.7 83.9 85.6  MCH 26.4 26.6 26.7  MCHC 31.1 31.7 31.2  RDW 18.6* 18.3* 18.2*  PLT 266 233 243    Cardiac Enzymes Recent Labs  Lab 10/17/18 1440 10/17/18 1708 10/17/18 2249 10/18/18 0442  TROPONINI  0.05* 0.05* 0.05* 0.05*   No results for input(s): TROPIPOC in the last 168 hours.   BNP Recent Labs  Lab 10/17/18 1440  BNP 728.0*     DDimer No results for input(s): DDIMER in the last 168 hours.   Radiology    Dg Chest Port 1 View  Result Date: 10/17/2018 CLINICAL DATA:  Dizziness for 1 week, tachycardia, history diabetes mellitus, CHF EXAM: PORTABLE CHEST 1 VIEW COMPARISON:  Portable exam 1536 hours compared to 07/09/2018 Correlation: CT chest 07/05/2018 FINDINGS: Enlargement of cardiac silhouette. Inferior mediastinal density consistent with hiatal hernia as identified on a prior CT. Mediastinal contours and pulmonary vascularity otherwise normal. Lungs clear. No pleural effusion or pneumothorax. Bones demineralized. IMPRESSION: Enlargement of cardiac silhouette. Hiatal hernia. No acute abnormalities. Electronically Signed   By: Lavonia Dana M.D.   On: 10/17/2018 16:03    Cardiac Studies   ECHO  Study Conclusions  - Left ventricle: The cavity size was  normal. Systolic function was   normal. The estimated ejection fraction was in the range of 60%   to 65%. Wall motion was normal; there were no regional wall   motion abnormalities. Features are consistent with a pseudonormal   left ventricular filling pattern, with concomitant abnormal   relaxation and increased filling pressure (grade 2 diastolic   dysfunction). - Mitral valve: Calcified annulus. Mildly thickened, mildly   calcified leaflets . There was mild regurgitation. - Left atrium: The atrium was mildly to moderately dilated. - Tricuspid valve: There was moderate regurgitation. - Pulmonary arteries: Systolic pressure was moderately increased.   PA peak pressure: 52 mm Hg (S).   Patient Profile     85 y.o. female who presented to the hospital with tachycardia, dizziness and noted to be in flutter with rapid rate.    Assessment & Plan    ATRIAL FLUTTER:    Rate has been difficult to control.  She received 30 mg PO Cardizem yesterday and this morning 180 mg CD.   Plan to stop the IV Dilt and see how her rate is with movement about the room.  Can restart IV if rate goes up.  She might need TEE/DCCV.  Starting Eliquis.   CHRONIC DIASTOLIC HF:    Agree with current dose of oral Lasix.      For questions or updates, please contact [place] Please consult [email or web address] for contact info under Cardiology/STEMI.   Signed, Minus Breeding, MD  10/19/2018, 9:31 AM

## 2018-10-20 DIAGNOSIS — I484 Atypical atrial flutter: Secondary | ICD-10-CM

## 2018-10-20 LAB — CBC
HCT: 40.8 % (ref 36.0–46.0)
Hemoglobin: 12.6 g/dL (ref 12.0–15.0)
MCH: 26.5 pg (ref 26.0–34.0)
MCHC: 30.9 g/dL (ref 30.0–36.0)
MCV: 85.9 fL (ref 80.0–100.0)
Platelets: 238 10*3/uL (ref 150–400)
RBC: 4.75 MIL/uL (ref 3.87–5.11)
RDW: 17.6 % — ABNORMAL HIGH (ref 11.5–15.5)
WBC: 10.7 10*3/uL — ABNORMAL HIGH (ref 4.0–10.5)
nRBC: 0 % (ref 0.0–0.2)

## 2018-10-20 LAB — GLUCOSE, CAPILLARY
GLUCOSE-CAPILLARY: 118 mg/dL — AB (ref 70–99)
Glucose-Capillary: 140 mg/dL — ABNORMAL HIGH (ref 70–99)
Glucose-Capillary: 206 mg/dL — ABNORMAL HIGH (ref 70–99)
Glucose-Capillary: 209 mg/dL — ABNORMAL HIGH (ref 70–99)

## 2018-10-20 MED ORDER — DILTIAZEM HCL ER COATED BEADS 240 MG PO CP24
240.0000 mg | ORAL_CAPSULE | Freq: Every day | ORAL | Status: DC
  Administered 2018-10-20 – 2018-10-21 (×2): 240 mg via ORAL
  Filled 2018-10-20 (×2): qty 1
  Filled 2018-10-20 (×2): qty 2
  Filled 2018-10-20: qty 1

## 2018-10-20 MED ORDER — METOPROLOL SUCCINATE ER 50 MG PO TB24
50.0000 mg | ORAL_TABLET | Freq: Two times a day (BID) | ORAL | Status: DC
  Administered 2018-10-20 – 2018-10-21 (×3): 50 mg via ORAL
  Filled 2018-10-20 (×3): qty 1

## 2018-10-20 MED ORDER — CARVEDILOL 6.25 MG PO TABS: 6.2500 mg | ORAL_TABLET | Freq: Two times a day (BID) | ORAL | Status: DC

## 2018-10-20 MED ORDER — FUROSEMIDE 40 MG PO TABS
40.0000 mg | ORAL_TABLET | Freq: Every day | ORAL | Status: DC
  Administered 2018-10-21 – 2018-10-23 (×3): 40 mg via ORAL
  Filled 2018-10-20 (×3): qty 1

## 2018-10-20 NOTE — Progress Notes (Signed)
LCSW reviewed patient notes and bed offers. Weekday LCSW will follow up on patient's progress.  [employer] LCSW [phone]

## 2018-10-20 NOTE — Progress Notes (Signed)
Per Cardiology note, RN will taper cardizem gtt down per policy. I will continue to assess.

## 2018-10-20 NOTE — Progress Notes (Signed)
Cardizem gtt titrated down to 5mg /hr. Patient's HR is tolerating decrease thus far. Pt is in NSR with rate in 70s at the end of shift. I will continue to assess.

## 2018-10-20 NOTE — Progress Notes (Addendum)
[place] at [place] NAME: Diane Mullins    MR#:  [id number]  DATE OF BIRTH:  [birth year]  SUBJECTIVE:  Patient is feeling better, no complaints, cardiology input appreciated, noted A. fib with RVR with heart rate uncontrolled overnight-did require restart of Cardizem drip, urine culture noted, increase Coreg and Cardizem CD, wean Cardizem drip off as tolerated  REVIEW OF SYSTEMS:    ROS  CONSTITUTIONAL: No documented fever. No fatigue, weakness. No weight gain, no weight loss.  EYES: No blurry or double vision.  ENT: No tinnitus. No postnasal drip. No redness of the oropharynx.  RESPIRATORY: No cough, no wheeze, no hemoptysis. No dyspnea.  CARDIOVASCULAR: No chest pain. No orthopnea. Had palpitations. No syncope.  GASTROINTESTINAL: No nausea, no vomiting or diarrhea. No abdominal pain. No melena or hematochezia.  GENITOURINARY: No dysuria or hematuria.  ENDOCRINE: No polyuria or nocturia. No heat or cold intolerance.  HEMATOLOGY: No anemia. No bruising. No bleeding.  INTEGUMENTARY: No rashes. No lesions.  MUSCULOSKELETAL: No arthritis. No swelling. No gout.  NEUROLOGIC: No numbness, tingling, or ataxia. No seizure-type activity.  PSYCHIATRIC: No anxiety. No insomnia. No ADD.   DRUG ALLERGIES:   Allergies  Allergen Reactions  . Oxycodone     Other reaction(s): Confusion Patient's daughter reported confusion/sensitivity to oxycodone.     VITALS:  Blood pressure (!) 110/58, pulse 71, temperature 97.7 F (36.5 C), temperature source Oral, resp. rate 19, height 5\' 1"  (1.549 m), weight 93.4 kg, SpO2 93 %.  PHYSICAL EXAMINATION:   Physical Exam  GENERAL:  85 y.o.-year-old patient lying in the bed with no acute distress.  EYES: Pupils equal, round, reactive to light and accommodation. No scleral icterus. Extraocular muscles intact.  HEENT: Head atraumatic, normocephalic. Oropharynx and nasopharynx clear.  NECK:  Supple, no jugular venous  distention. No thyroid enlargement, no tenderness.  LUNGS: Normal breath sounds bilaterally, no wheezing, rales, rhonchi. No use of accessory muscles of respiration.  CARDIOVASCULAR: S1, S2 irregular. No murmurs, rubs, or gallops.  ABDOMEN: Soft, nontender, nondistended. Bowel sounds present. No organomegaly or mass.  EXTREMITIES: No cyanosis, clubbing or edema b/l.    NEUROLOGIC: Cranial nerves II through XII are intact. No focal Motor or sensory deficits b/l.   PSYCHIATRIC: The patient is alert and oriented x 3.  SKIN: No obvious rash, lesion, or ulcer.   LABORATORY PANEL:   CBC Recent Labs  Lab 10/20/18 0414  WBC 10.7*  HGB 12.6  HCT 40.8  PLT 238   ------------------------------------------------------------------------------------------------------------------ Chemistries  Recent Labs  Lab 10/19/18 0528  NA 138  K 4.3  CL 102  CO2 28  GLUCOSE 128*  BUN 21  CREATININE 1.07*  CALCIUM 8.6*   ------------------------------------------------------------------------------------------------------------------  Cardiac Enzymes Recent Labs  Lab 10/18/18 0442  TROPONINI 0.05*   ------------------------------------------------------------------------------------------------------------------  RADIOLOGY:  No results found.   ASSESSMENT AND PLAN:  85 year old elderly female patient with history of diabetes mellitus type 2, hypertension, TIA in the past, pulmonary hypertension, diastolic heart failure currently under hospitalist service for palpitations and dizziness  *Acute atrial flutter with RVR, new onset Stable Increase Cardizem CD 240 mg daily, Coreg to 6.25 mg twice daily, continue to wean Cardizem drip as tolerated, did receive digoxin as well-level 1.2 October 19, 2018, used heparin bridge to [employer] cardiology/Dr. Rockey Situ input appreciated Outpatient cardioversion after 4 weeks  *Acute on chronic diastolic heart failure exacerbation   Secondary to above   Stable Continue CHF protocol, Lasix, strict I&O monitoring, daily weights,  Coreg  *Chronic diabetes mellitus type 2  Stable on current regiment   *Acute UTI  Resolving Continue empiric Rocephin for 1 more day, urine culture noted for contaminant   *Chronic B12 deficiency Stable Continue vitamin B12  Disposition to independent living facility in 1 to 2 days barring any complications  All the records are reviewed and case discussed with Care Management/Social Worker. Management plans discussed with the patient, family and they are in agreement.  CODE STATUS: Full code  DVT Prophylaxis: SCDs  TOTAL TIME TAKING CARE OF THIS PATIENT: 35 minutes.   POSSIBLE D/C IN 1-2 DAYS, DEPENDING ON CLINICAL CONDITION.  Avel Peace Kelee Cunningham M.D on 10/20/2018 at 11:04 AM  Between 7am to 6pm - Pager - [phone]  After 6pm go to [email or web address] - password [email or web address] [facility]  Office  [phone]  CC: Primary care physician; [name], MD  Note: This dictation was prepared with Dragon dictation along with smaller phrase technology. Any transcriptional errors that result from this process are unintentional.

## 2018-10-20 NOTE — Plan of Care (Signed)
Patient's heart rate is increased to the 140 at 1900's. Administered a one time iv push of cardizem. Without any improvement, contacted the cardiologist per Dr.Hugelmeyer request. Cardizem drip has been reinstated. Patient's heart rate remains elevated.

## 2018-10-20 NOTE — Progress Notes (Signed)
Progress Note  Patient Name: Diane Mullins Date of Encounter: 10/20/2018  Primary Cardiologist:   No primary care provider on file.   Subjective   Rate was still elevated and had to have IV Dilt restarted last night. She feels weak but no other acute complaints  Inpatient Medications    Scheduled Meds: . apixaban  5 mg Oral BID  . aspirin EC  81 mg Oral Daily  . carvedilol  6.25 mg Oral BID WC  . diltiazem  240 mg Oral Daily  . ferrous sulfate  325 mg Oral Q breakfast  . furosemide  40 mg Oral BID  . insulin aspart  0-5 Units Subcutaneous QHS  . insulin aspart  0-9 Units Subcutaneous TID WC  . insulin glargine  17 Units Subcutaneous Daily  . levothyroxine  75 mcg Oral QAC breakfast  . metFORMIN  1,000 mg Oral BID WC  . metoCLOPramide (REGLAN) injection  10 mg Intravenous Q6H  . metoprolol succinate  25 mg Oral BID  . multivitamin with minerals  1 tablet Oral Daily  . pantoprazole  40 mg Oral Daily  . potassium chloride  10 mEq Oral Daily  . simvastatin  40 mg Oral QPM  . sodium chloride flush  10 mL Intravenous Q12H  . vitamin B-12  1,000 mcg Oral Daily   Continuous Infusions: . sodium chloride 500 mL (10/19/18 2335)  . cefTRIAXone (ROCEPHIN)  IV 1 g (10/20/18 1101)  . diltiazem (CARDIZEM) infusion Stopped (10/19/18 1411)  . diltiazem (CARDIZEM) infusion 15 mg/hr (10/20/18 0557)   PRN Meds: sodium chloride, acetaminophen **OR** acetaminophen, ondansetron **OR** ondansetron (ZOFRAN) IV, polyethylene glycol, temazepam   Vital Signs    Vitals:   10/20/18 0300 10/20/18 0309 10/20/18 0339 10/20/18 0743  BP: (!) 97/55 100/67  (!) 110/58  Pulse: 70 (!) 106 82 71  Resp:   18 19  Temp:   98.6 F (37 C) 97.7 F (36.5 C)  TempSrc:    Oral  SpO2:  100% 96% 93%  Weight:  93.4 kg    Height:        Intake/Output Summary (Last 24 hours) at 10/20/2018 1325 Last data filed at 10/20/2018 1008 Gross per 24 hour  Intake 313.2 ml  Output 1850 ml  Net -1536.8 ml   Filed  Weights   10/18/18 0353 10/19/18 0500 10/20/18 0309  Weight: 62.9 kg 92.3 kg 93.4 kg    Telemetry    Atrial flutter with variable conduction.  Occasional 2:1 conduction.  - Personally Reviewed  ECG    NA - Personally Reviewed  Physical Exam   GEN: No acute distress.   Neck: No  JVD Cardiac: IrregularRR, no murmurs, rubs, or gallops.  Respiratory:   Decreased breath sounds with few wheezes GI: Soft, nontender, non-distended  MS: No  edema; No deformity. Neuro:  Nonfocal  Psych: Normal affect   Labs    Chemistry Recent Labs  Lab 10/17/18 1440 10/18/18 0442 10/19/18 0528  NA 136 138 138  K 4.7 4.0 4.3  CL 103 105 102  CO2 24 26 28   GLUCOSE 167* 144* 128*  BUN 25* 24* 21  CREATININE 1.15* 1.03* 1.07*  CALCIUM 8.8* 8.6* 8.6*  GFRNONAA 44* 50* 48*  GFRAA 51* 58* 55*  ANIONGAP 9 7 8      Hematology Recent Labs  Lab 10/18/18 0442 10/19/18 0528 10/20/18 0414  WBC 8.8 10.9* 10.7*  RBC 4.36 4.65 4.75  HGB 11.6* 12.4 12.6  HCT 36.6 39.8 40.8  MCV 83.9 85.6 85.9  MCH 26.6 26.7 26.5  MCHC 31.7 31.2 30.9  RDW 18.3* 18.2* 17.6*  PLT 233 243 238    Cardiac Enzymes Recent Labs  Lab 10/17/18 1440 10/17/18 1708 10/17/18 2249 10/18/18 0442  TROPONINI 0.05* 0.05* 0.05* 0.05*   No results for input(s): TROPIPOC in the last 168 hours.   BNP Recent Labs  Lab 10/17/18 1440  BNP 728.0*     DDimer No results for input(s): DDIMER in the last 168 hours.   Radiology    No results found.  Cardiac Studies   ECHO  Study Conclusions  - Left ventricle: The cavity size was normal. Systolic function was   normal. The estimated ejection fraction was in the range of 60%   to 65%. Wall motion was normal; there were no regional wall   motion abnormalities. Features are consistent with a pseudonormal   left ventricular filling pattern, with concomitant abnormal   relaxation and increased filling pressure (grade 2 diastolic   dysfunction). - Mitral valve:  Calcified annulus. Mildly thickened, mildly   calcified leaflets . There was mild regurgitation. - Left atrium: The atrium was mildly to moderately dilated. - Tricuspid valve: There was moderate regurgitation. - Pulmonary arteries: Systolic pressure was moderately increased.   PA peak pressure: 52 mm Hg (S).   Patient Profile     85 y.o. female who presented to the hospital with tachycardia, dizziness and noted to be in flutter with rapid rate.    Assessment & Plan    ATRIAL FLUTTER:    Rate has been difficult to control.  Eliquis started.   Stop ASA.  Cardizem PO increased.   Try again to wean the IV Cardizem.  Of note she is on two beta blockers.  I will stop the Coreg and increase the Toprol.  This has been bid even though it is XL.  I will give the PM dose tonight.  It could be changed to once daily at discharge.   Might need TEE/DCCV in the AM .  Keep NPO after MN.   CHRONIC DIASTOLIC HF:    Minus 1.9 liters since admission.  Seems to be euvolemic.  I will reduce the Lasix to once daily.   HYPOTENSION:  BP is running low.     For questions or updates, please contact [place] Please consult [email or web address] for contact info under Cardiology/STEMI.   Signed, Minus Breeding, MD  10/20/2018, 1:25 PM

## 2018-10-21 ENCOUNTER — Encounter: Payer: Self-pay | Admitting: Anesthesiology

## 2018-10-21 DIAGNOSIS — I4892 Unspecified atrial flutter: Principal | ICD-10-CM

## 2018-10-21 LAB — GLUCOSE, CAPILLARY
Glucose-Capillary: 161 mg/dL — ABNORMAL HIGH (ref 70–99)
Glucose-Capillary: 162 mg/dL — ABNORMAL HIGH (ref 70–99)
Glucose-Capillary: 154 mg/dL — ABNORMAL HIGH (ref 70–99)
Glucose-Capillary: 184 mg/dL — ABNORMAL HIGH (ref 70–99)
Glucose-Capillary: 172 mg/dL — ABNORMAL HIGH (ref 70–99)

## 2018-10-21 MED ORDER — IPRATROPIUM-ALBUTEROL 0.5-2.5 (3) MG/3ML IN SOLN
3.0000 mL | Freq: Four times a day (QID) | RESPIRATORY_TRACT | Status: DC | PRN
  Administered 2018-10-21 – 2018-10-27 (×6): 3 mL via RESPIRATORY_TRACT
  Filled 2018-10-21 (×6): qty 3

## 2018-10-21 MED ORDER — SODIUM CHLORIDE 0.9 % IV SOLN: 250.0000 mL | INTRAVENOUS | Status: DC

## 2018-10-21 MED ORDER — LACTULOSE 10 GM/15ML PO SOLN
20.0000 g | Freq: Once | ORAL | Status: AC
  Administered 2018-10-21: 20 g via ORAL
  Filled 2018-10-21: qty 30

## 2018-10-21 MED ORDER — GLUCERNA SHAKE PO LIQD
237.0000 mL | Freq: Three times a day (TID) | ORAL | Status: DC
  Administered 2018-10-21 (×2): 237 mL via ORAL

## 2018-10-21 MED ORDER — PHENOL 1.4 % MT LIQD
1.0000 | OROMUCOSAL | Status: DC | PRN
  Administered 2018-10-21 – 2018-10-22 (×2): 1 via OROMUCOSAL
  Filled 2018-10-21: qty 177

## 2018-10-21 MED ORDER — SODIUM CHLORIDE 0.9% FLUSH
3.0000 mL | Freq: Two times a day (BID) | INTRAVENOUS | Status: DC
  Administered 2018-10-21 – 2018-10-27 (×10): 3 mL via INTRAVENOUS

## 2018-10-21 MED ORDER — SODIUM CHLORIDE 0.9% FLUSH
3.0000 mL | INTRAVENOUS | Status: DC | PRN
  Administered 2018-10-24 (×2): 3 mL via INTRAVENOUS
  Filled 2018-10-21 (×2): qty 3

## 2018-10-21 MED ORDER — GUAIFENESIN 100 MG/5ML PO SOLN
5.0000 mL | ORAL | Status: DC | PRN
  Administered 2018-10-21 – 2018-10-23 (×4): 100 mg via ORAL
  Filled 2018-10-21 (×7): qty 5

## 2018-10-21 NOTE — Plan of Care (Signed)
  Problem: Education: Goal: Knowledge of General Education information will improve Description: Including pain rating scale, medication(s)/side effects and non-pharmacologic comfort measures Outcome: Progressing   Problem: Clinical Measurements: Goal: Will remain free from infection Outcome: Progressing   

## 2018-10-21 NOTE — H&P (View-Only) (Signed)
Progress Note  Patient Name: Diane Mullins Date of Encounter: 10/21/2018  Primary Cardiologist: new ( seen by Dr. Rockey Situ)  Subjective   No chest pain.  She does complain of a sore throat and some coughing.  She is still in atrial flutter.  Ventricular rate most of the time is controlled but she has intermittent tachycardia in spite of treatment with diltiazem and metoprolol.  She feels weak.  Inpatient Medications    Scheduled Meds: . apixaban  5 mg Oral BID  . diltiazem  240 mg Oral Daily  . ferrous sulfate  325 mg Oral Q breakfast  . furosemide  40 mg Oral Daily  . insulin aspart  0-5 Units Subcutaneous QHS  . insulin aspart  0-9 Units Subcutaneous TID WC  . insulin glargine  17 Units Subcutaneous Daily  . levothyroxine  75 mcg Oral QAC breakfast  . metFORMIN  1,000 mg Oral BID WC  . metoCLOPramide (REGLAN) injection  10 mg Intravenous Q6H  . metoprolol succinate  50 mg Oral BID  . multivitamin with minerals  1 tablet Oral Daily  . pantoprazole  40 mg Oral Daily  . potassium chloride  10 mEq Oral Daily  . simvastatin  40 mg Oral QPM  . sodium chloride flush  10 mL Intravenous Q12H  . vitamin B-12  1,000 mcg Oral Daily   Continuous Infusions: . sodium chloride 10 mL/hr at 10/20/18 1716  . cefTRIAXone (ROCEPHIN)  IV 1 g (10/21/18 1008)  . diltiazem (CARDIZEM) infusion Stopped (10/19/18 1411)  . diltiazem (CARDIZEM) infusion 15 mg/hr (10/21/18 1110)   PRN Meds: sodium chloride, acetaminophen **OR** acetaminophen, ondansetron **OR** ondansetron (ZOFRAN) IV, phenol, polyethylene glycol, temazepam   Vital Signs    Vitals:   10/20/18 2300 10/20/18 2330 10/21/18 0415 10/21/18 0756  BP: (!) 107/49 (!) 113/51 126/61 (!) 119/53  Pulse: 71 71 68 69  Resp:   16 18  Temp:   97.7 F (36.5 C) 98.1 F (36.7 C)  TempSrc:   Oral Oral  SpO2:   100% 96%  Weight:   94.6 kg   Height:        Intake/Output Summary (Last 24 hours) at 10/21/2018 1112 Last data filed at 10/21/2018  0300 Gross per 24 hour  Intake 797.25 ml  Output 200 ml  Net 597.25 ml   Last 3 Weights 10/21/2018 10/20/2018 10/19/2018  Weight (lbs) 208 lb 8 oz 206 lb 203 lb 7.8 oz  Weight (kg) 94.575 kg 93.441 kg 92.3 kg      Telemetry    Atrial flutter with variable ventricular rate.- Personally Reviewed  ECG     - Personally Reviewed  Physical Exam   GEN: No acute distress.   Neck: No JVD Cardiac: Irregularly irregular, no murmurs, rubs, or gallops.  Respiratory:  Mild expiratory wheezing GI: Soft, nontender, non-distended  MS: No edema; No deformity. Neuro:  Nonfocal  Psych: Normal affect   Labs    Chemistry Recent Labs  Lab 10/17/18 1440 10/18/18 0442 10/19/18 0528  NA 136 138 138  K 4.7 4.0 4.3  CL 103 105 102  CO2 24 26 28   GLUCOSE 167* 144* 128*  BUN 25* 24* 21  CREATININE 1.15* 1.03* 1.07*  CALCIUM 8.8* 8.6* 8.6*  GFRNONAA 44* 50* 48*  GFRAA 51* 58* 55*  ANIONGAP 9 7 8      Hematology Recent Labs  Lab 10/18/18 0442 10/19/18 0528 10/20/18 0414  WBC 8.8 10.9* 10.7*  RBC 4.36 4.65 4.75  HGB  11.6* 12.4 12.6  HCT 36.6 39.8 40.8  MCV 83.9 85.6 85.9  MCH 26.6 26.7 26.5  MCHC 31.7 31.2 30.9  RDW 18.3* 18.2* 17.6*  PLT 233 243 238    Cardiac Enzymes Recent Labs  Lab 10/17/18 1440 10/17/18 1708 10/17/18 2249 10/18/18 0442  TROPONINI 0.05* 0.05* 0.05* 0.05*   No results for input(s): TROPIPOC in the last 168 hours.   BNP Recent Labs  Lab 10/17/18 1440  BNP 728.0*     DDimer No results for input(s): DDIMER in the last 168 hours.   Radiology    No results found.  Cardiac Studies   Echocardiogram in October 2019:  ECHO  Study Conclusions  - Left ventricle: The cavity size was normal. Systolic function was normal. The estimated ejection fraction was in the range of 60% to 65%. Wall motion was normal; there were no regional wall motion abnormalities. Features are consistent with a pseudonormal left ventricular filling pattern, with  concomitant abnormal relaxation and increased filling pressure (grade 2 diastolic dysfunction). - Mitral valve: Calcified annulus. Mildly thickened, mildly calcified leaflets . There was mild regurgitation. - Left atrium: The atrium was mildly to moderately dilated. - Tricuspid valve: There was moderate regurgitation. - Pulmonary arteries: Systolic pressure was moderately increased. PA peak pressure: 52 mm Hg (S).   Patient Profile     85 y.o. female who presented to the hospital with tachycardia, dizziness and noted to be in flutter with rapid rate.    Assessment & Plan    1.  Atrial flutter: Ventricular rate is more controlled than before it continues to be not optimal.  She did have an episode of tachycardia this morning the heart rate of 130 bpm.  She is currently on diltiazem extended release 240 mg once daily and metoprolol extended release 50 mg twice daily.  Not able to increase the dosages given relatively low blood pressure and I am worried about the possibility of causing bradycardia on higher doses especially with her age. I think her best option is to proceed with TEE guided cardioversion.  I discussed the procedure in details as well as risks and benefits.  We will schedule this for tomorrow morning.  2.  Chronic diastolic heart failure: She is currently on oral furosemide 40 mg once daily.  She appears to be euvolemic.     For questions or updates, please contact [place] Please consult [email or web address] for contact info under        Signed, Kathlyn Sacramento, MD  10/21/2018, 11:12 AM

## 2018-10-21 NOTE — Progress Notes (Addendum)
[place] [facility] [place] NAME: Diane Mullins    MR#:  [id number]  DATE OF BIRTH:  Feb 23, 1933  SUBJECTIVE:  Patient complains of constipation, cardiology input appreciated-planning TEE with cardioversion, noted A. fib with RVR with heart rate uncontrolled overnight once again requiring increase in Cardizem drip REVIEW OF SYSTEMS:    ROS  CONSTITUTIONAL: No documented fever. No fatigue, weakness. No weight gain, no weight loss.  EYES: No blurry or double vision.  ENT: No tinnitus. No postnasal drip. No redness of the oropharynx.  RESPIRATORY: No cough, no wheeze, no hemoptysis. No dyspnea.  CARDIOVASCULAR: No chest pain. No orthopnea. Had palpitations. No syncope.  GASTROINTESTINAL: No nausea, no vomiting or diarrhea. No abdominal pain. No melena or hematochezia.  GENITOURINARY: No dysuria or hematuria.  ENDOCRINE: No polyuria or nocturia. No heat or cold intolerance.  HEMATOLOGY: No anemia. No bruising. No bleeding.  INTEGUMENTARY: No rashes. No lesions.  MUSCULOSKELETAL: No arthritis. No swelling. No gout.  NEUROLOGIC: No numbness, tingling, or ataxia. No seizure-type activity.  PSYCHIATRIC: No anxiety. No insomnia. No ADD.   DRUG ALLERGIES:   Allergies  Allergen Reactions  . Oxycodone     Other reaction(s): Confusion Patient's daughter reported confusion/sensitivity to oxycodone.     VITALS:  Blood pressure (!) 119/53, pulse 69, temperature 98.1 F (36.7 C), temperature source Oral, resp. rate 18, height 5\' 1"  (1.549 m), weight 94.6 kg, SpO2 96 %.  PHYSICAL EXAMINATION:   Physical Exam  GENERAL:  85 y.o.-year-old patient lying in the bed with no acute distress.  EYES: Pupils equal, round, reactive to light and accommodation. No scleral icterus. Extraocular muscles intact.  HEENT: Head atraumatic, normocephalic. Oropharynx and nasopharynx clear.  NECK:  Supple, no jugular venous distention. No thyroid enlargement, no tenderness.   LUNGS: Normal breath sounds bilaterally, no wheezing, rales, rhonchi. No use of accessory muscles of respiration.  CARDIOVASCULAR: S1, S2 irregular. No murmurs, rubs, or gallops.  ABDOMEN: Soft, nontender, nondistended. Bowel sounds present. No organomegaly or mass.  EXTREMITIES: No cyanosis, clubbing or edema b/l.    NEUROLOGIC: Cranial nerves II through XII are intact. No focal Motor or sensory deficits b/l.   PSYCHIATRIC: The patient is alert and oriented x 3.  SKIN: No obvious rash, lesion, or ulcer.   LABORATORY PANEL:   CBC Recent Labs  Lab 10/20/18 0414  WBC 10.7*  HGB 12.6  HCT 40.8  PLT 238   ------------------------------------------------------------------------------------------------------------------ Chemistries  Recent Labs  Lab 10/19/18 0528  NA 138  K 4.3  CL 102  CO2 28  GLUCOSE 128*  BUN 21  CREATININE 1.07*  CALCIUM 8.6*   ------------------------------------------------------------------------------------------------------------------  Cardiac Enzymes Recent Labs  Lab 10/18/18 0442  TROPONINI 0.05*   ------------------------------------------------------------------------------------------------------------------  RADIOLOGY:  No results found.   ASSESSMENT AND PLAN:  85 year old elderly female patient with history of diabetes mellitus type 2, hypertension, TIA in the past, pulmonary hypertension, diastolic heart failure currently under hospitalist service for palpitations and dizziness  *Acute atrial flutter with RVR, new onset Stable Continue p.o. Cardizem, Cardizem drip with weaning as tolerated, Coreg, did receive digoxin as well-level 1.2 October 19, 2018, used heparin bridge to [facility] cardiology, per Dr. Raelyn Mora for TEE with cardioversion given episodes of uncontrollable A. fib with RVR   *Acute on chronic diastolic heart failure exacerbation   Secondary to above  Stable Continue CHF protocol, Lasix, strict I&O monitoring,  daily weights, Coreg  *Chronic diabetes mellitus type 2  Stable on current regiment   *  Acute UTI  Resolved Treated with course of Rocephin, urine culture noted for contaminant   *Chronic B12 deficiency Stable Continue vitamin B12  Disposition to independent living facility pending cardiology clearance   All the records are reviewed and case discussed with Care Management/Social Worker. Management plans discussed with the patient, family and they are in agreement.  CODE STATUS: Full code  DVT Prophylaxis: SCDs  TOTAL TIME TAKING CARE OF THIS PATIENT: 35 minutes.   POSSIBLE D/C IN 1-2 DAYS, DEPENDING ON CLINICAL CONDITION.  Avel Peace  M.D on 10/21/2018 at 12:25 PM  Between 7am to 6pm - Pager - [phone]  After 6pm go to [email or web address] - password [email or web address] [place] [facility]  Office  [phone]  CC: Primary care physician; [name], MD  Note: This dictation was prepared with Dragon dictation along with smaller phrase technology. Any transcriptional errors that result from this process are unintentional.

## 2018-10-21 NOTE — Care Management Important Message (Signed)
Copy of signed Medicare IM left with patient in room. 

## 2018-10-21 NOTE — Plan of Care (Signed)
Patient heart rate increased to the 140's, increased cardizem drip to 15mg  per protocol. Patient is currently NSR with a heart rate in the 70's.

## 2018-10-21 NOTE — Progress Notes (Incomplete)
Cardiology Office Note  Date:  10/21/2018   ID:  Diane Mullins, DOB [birth year], MRN [id number]  PCP:  [name], MD   No chief complaint on file.   HPI:    PMH:   has a past medical history of Benign paroxysmal positional vertigo (07/17/2014), Chronic diastolic CHF (congestive heart failure) ([place]), Diabetes mellitus, H/O: rheumatic fever, Hyperlipidemia, Hypertension, Pulmonary hypertension ([place]), Stroke ([place]), Teratoma of left ovary, and Thyroid disease.  PSH:    Past Surgical History:  Procedure Laterality Date   ABDOMINAL HYSTERECTOMY  1973   menorrhagia   HERNIA REPAIR  1994   ORIF ANKLE FRACTURE Right 10/29/2015   Procedure: OPEN REDUCTION INTERNAL FIXATION (ORIF) ANKLE FRACTURE;  Surgeon: Thornton Park, MD;  Location: [facility] ORS;  Service: Orthopedics;  Laterality: Right;   VAGINAL DELIVERY     x4    No current facility-administered medications for this visit.    No current outpatient medications on file.   Facility-Administered Medications Ordered in Other Visits  Medication Dose Route Frequency Provider Last Rate Last Dose   0.9 %  sodium chloride infusion   Intravenous PRN Salary, Montell D, MD 10 mL/hr at 10/20/18 1716     0.9 %  sodium chloride infusion  250 mL Intravenous Continuous Visser, Jacquelyn D, PA-C       acetaminophen (TYLENOL) tablet 650 mg  650 mg Oral Q6H PRN Gladstone Lighter, MD   650 mg at 10/18/18 0945   Or   acetaminophen (TYLENOL) suppository 650 mg  650 mg Rectal Q6H PRN Gladstone Lighter, MD       apixaban Arne Cleveland) tablet 5 mg  5 mg Oral BID Cyndee Brightly M, RPH   5 mg at 10/21/18 1002   diltiazem (CARDIZEM CD) 24 hr capsule 240 mg  240 mg Oral Daily Salary, Montell D, MD   240 mg at 10/21/18 1002   diltiazem (CARDIZEM) 100 mg in dextrose 5% 127mL (1 mg/mL) infusion  5-15 mg/hr Intravenous Titrated Minna Merritts, MD   Stopped at 10/19/18 1411   diltiazem (CARDIZEM) 100 mg in dextrose 5% 143mL (1 mg/mL) infusion  5-15  mg/hr Intravenous Continuous Minna Merritts, MD 15 mL/hr at 10/21/18 1110 15 mg/hr at 10/21/18 1110   feeding supplement (GLUCERNA SHAKE) (GLUCERNA SHAKE) liquid 237 mL  237 mL Oral TID BM Salary, Montell D, MD   237 mL at 10/21/18 1440   ferrous sulfate tablet 325 mg  325 mg Oral Q breakfast Gladstone Lighter, MD   325 mg at 10/21/18 0845   furosemide (LASIX) tablet 40 mg  40 mg Oral Daily Minus Breeding, MD   40 mg at 10/21/18 1002   guaiFENesin (ROBITUSSIN) 100 MG/5ML solution 100 mg  5 mL Oral Q4H PRN Salary, Montell D, MD       insulin aspart (novoLOG) injection 0-5 Units  0-5 Units Subcutaneous QHS Gladstone Lighter, MD   2 Units at 10/20/18 2203   insulin aspart (novoLOG) injection 0-9 Units  0-9 Units Subcutaneous TID WC Gladstone Lighter, MD   2 Units at 10/21/18 1224   insulin glargine (LANTUS) injection 17 Units  17 Units Subcutaneous Daily Gladstone Lighter, MD   17 Units at 10/21/18 1003   ipratropium-albuterol (DUONEB) 0.5-2.5 (3) MG/3ML nebulizer solution 3 mL  3 mL Nebulization Q6H PRN Salary, Montell D, MD   3 mL at 10/21/18 1215   levothyroxine (SYNTHROID, LEVOTHROID) tablet 75 mcg  75 mcg Oral QAC breakfast Gladstone Lighter, MD   75 mcg at 10/21/18  6144   metFORMIN (GLUCOPHAGE) tablet 1,000 mg  1,000 mg Oral BID WC Gladstone Lighter, MD   1,000 mg at 10/21/18 0845   metoCLOPramide (REGLAN) injection 10 mg  10 mg Intravenous Q6H Salary, Montell D, MD   10 mg at 10/21/18 1216   metoprolol succinate (TOPROL-XL) 24 hr tablet 50 mg  50 mg Oral BID Minus Breeding, MD   50 mg at 10/21/18 1002   multivitamin with minerals tablet 1 tablet  1 tablet Oral Daily Pyreddy, Reatha Harps, MD   1 tablet at 10/21/18 1002   ondansetron (ZOFRAN) tablet 4 mg  4 mg Oral Q6H PRN Gladstone Lighter, MD   4 mg at 10/20/18 1715   Or   ondansetron (ZOFRAN) injection 4 mg  4 mg Intravenous Q6H PRN Gladstone Lighter, MD   4 mg at 10/18/18 1044   pantoprazole (PROTONIX) EC tablet 40 mg   40 mg Oral Daily Gladstone Lighter, MD   40 mg at 10/21/18 1002   phenol (CHLORASEPTIC) mouth spray 1 spray  1 spray Mouth/Throat PRN Salary, Montell D, MD   1 spray at 10/21/18 1004   polyethylene glycol (MIRALAX / GLYCOLAX) packet 17 g  17 g Oral Daily PRN Gladstone Lighter, MD       potassium chloride (K-DUR) CR tablet 10 mEq  10 mEq Oral Daily Gladstone Lighter, MD   10 mEq at 10/21/18 1002   simvastatin (ZOCOR) tablet 40 mg  40 mg Oral QPM Gladstone Lighter, MD   40 mg at 10/20/18 1710   sodium chloride flush (NS) 0.9 % injection 10 mL  10 mL Intravenous Q12H Salary, Montell D, MD   10 mL at 10/20/18 2132   sodium chloride flush (NS) 0.9 % injection 3 mL  3 mL Intravenous Q12H Visser, Jacquelyn D, PA-C   3 mL at 10/21/18 1219   sodium chloride flush (NS) 0.9 % injection 3 mL  3 mL Intravenous PRN Mickle Plumb, Jacquelyn D, PA-C       temazepam (RESTORIL) capsule 15 mg  15 mg Oral QHS PRN Saundra Shelling, MD   15 mg at 10/20/18 2130   vitamin B-12 (CYANOCOBALAMIN) tablet 1,000 mcg  1,000 mcg Oral Daily Saundra Shelling, MD   1,000 mcg at 10/21/18 1002     Allergies:   Oxycodone   Social History:  The patient  reports that she has never smoked. She has never used smokeless tobacco. She reports that she does not drink alcohol or use drugs.   Family History:   family history includes Cancer in her brother, daughter, and father; Diabetes in her brother and sister; Heart attack (age of onset: 85) in her mother; Heart disease (age of onset: 34) in her mother; Hypertension in her sister; Non-Hodgkin's lymphoma in her son.    Review of Systems: ROS   PHYSICAL EXAM: VS:  There were no vitals taken for this visit. , BMI There is no height or weight on file to calculate BMI. GEN: Well nourished, well developed, in no acute distress HEENT: normal Neck: no JVD, carotid bruits, or masses Cardiac: RRR; no murmurs, rubs, or gallops,no edema  Respiratory:  clear to auscultation bilaterally,  normal work of breathing GI: soft, nontender, nondistended, + BS MS: no deformity or atrophy Skin: warm and dry, no rash Neuro:  Strength and sensation are intact Psych: euthymic mood, full affect    Recent Labs: 08/27/2018: ALT 9; Magnesium 1.6 10/17/2018: B Natriuretic Peptide 728.0; TSH 1.862 10/19/2018: BUN 21; Creatinine, Ser 1.07; Potassium 4.3; Sodium  138 10/20/2018: Hemoglobin 12.6; Platelets 238    Lipid Panel @LIPIDS @    Wt Readings from Last 3 Encounters:  10/21/18 208 lb 8 oz (94.6 kg)  10/02/18 206 lb 14.4 oz (93.8 kg)  08/27/18 211 lb 6.4 oz (95.9 kg)       ASSESSMENT AND PLAN:  No diagnosis found.   Disposition:   F/U  6 months  No orders of the defined types were placed in this encounter.    Signed, Esmond Plants, M.D., Ph.D. 10/21/2018  [place], [place]

## 2018-10-21 NOTE — Clinical Social Work Note (Signed)
CSW presented bed offers, and patient accepted [facility].  [facility] can accept patient once insurance authorization has been received.  PT will have to see patient for an updated PT note per insurance company.  [name]. [place], MSW, [place]  10/21/2018 5:23 PM

## 2018-10-21 NOTE — Progress Notes (Signed)
Nutrition Follow Up Note   DOCUMENTATION CODES:   Not applicable  INTERVENTION:   Glucerna Shake po TID, each supplement provides 220 kcal and 10 grams of protein  MVI daily  B12 1000 mcg po daily   Low sodium diet education   NUTRITION DIAGNOSIS:   Inadequate oral intake related to acute illness as evidenced by per patient/family report  GOAL:   Patient will meet greater than or equal to 90% of their needs  -progressing   MONITOR:   PO intake, Supplement acceptance, Labs, Weight trends, Skin, I & O's  ASSESSMENT:   85 year old elderly female patient with history of diabetes mellitus type 2, hypertension, TIA in the past, pulmonary hypertension, diastolic heart failure currently under hospitalist service for palpitations and dizziness  Met with pt in room today. Pt with improved appetite and oral intake; pt ate 85% of her lunch yesterday and a few bites of dinner. Pt did not have breakfast today as she was NPO for procedure but reports that she has ordered lunch and is ready to eat. Pt has not had any additional vomiting but reports coughing up mucous. Pt is willing to drink protein supplements; pt would like to have Glucerna. Per chart, pt with ~3lb weight gain since admit. Pt receiving B12 and Iron. RD will add supplements and liberalize pt's diet as a heart healthy diet is restrictive of protein.   RD provided "Low Sodium Nutrition Therapy" handout from the Academy of [employer]. Reviewed patient's dietary recall. Provided examples on ways to decrease sodium intake in diet. Discouraged intake of processed foods and use of salt shaker. Encouraged fresh fruits and vegetables as well as whole grain sources of carbohydrates to maximize fiber intake. RD discussed why it is important for patient to adhere to diet recommendations, and emphasized the role of fluids, foods to avoid, and importance of weighing self daily. Teach back method used.  Medications reviewed and  include: ferrous sulfate, lasix, insulin, synthroid, metformin, reglan, MVI, protonix, KCl  Labs reviewed: creat 1.07(H) Iron 40(L), TIBC 374, ferritin 18- 12/10 B12 <50(L)- 10/18  Diet Order:   Diet Order            Diet NPO time specified Except for: Sips with Meds  Diet effective midnight        Diet heart healthy/carb modified Room service appropriate? Yes; Fluid consistency: Thin  Diet effective now             EDUCATION NEEDS:   Not appropriate for education at this time  Skin:  Skin Assessment: Reviewed RN Assessment  Last BM:  1/31  Height:   Ht Readings from Last 1 Encounters:  10/17/18 '5\' 1"'  (1.549 m)    Weight:   Wt Readings from Last 1 Encounters:  10/21/18 94.6 kg    Ideal Body Weight:  47.7 kg  BMI:  Body mass index is 39.4 kg/m.  Estimated Nutritional Needs:   Kcal:  1500-1800kcal/day   Protein:  95-105g/day   Fluid:  >1.2L/day   Koleen Distance MS, RD, LDN Pager #- [phone] Office#- [phone] After Hours Pager: [phone]

## 2018-10-21 NOTE — Progress Notes (Signed)
Patient presently resting in the bed, alert and oriented, denies any pain, patient recurrently  on Cardizem drip rate 15 ml/hr, tolerating HR 75 A flutter,  Patient is schedule for cardioversion tomorrow, NPO after MN, consent in the chart .

## 2018-10-21 NOTE — Progress Notes (Signed)
PT Cancellation Note  Patient Details Name: MCKENLEY BIRENBAUM MRN: [id number] DOB: [birth year]   Cancelled Treatment:    Reason Eval/Treat Not Completed: Medical issues which prohibited therapy(Per chart review, patient continues with unstable HR, requiring increase in cardizem drip overnight.  Planning for TEE/cardioversion next date.  Will hold at this time and re-attempt post procedure as medically appropriate.)   Krishan Mcbreen H. Owens Shark, PT, DPT, NCS 10/21/18, 1:37 PM [phone]

## 2018-10-21 NOTE — Progress Notes (Signed)
Progress Note  Patient Name: Diane Mullins Date of Encounter: 10/21/2018  Primary Cardiologist: new ( seen by Dr. Rockey Situ)  Subjective   No chest pain.  She does complain of a sore throat and some coughing.  She is still in atrial flutter.  Ventricular rate most of the time is controlled but she has intermittent tachycardia in spite of treatment with diltiazem and metoprolol.  She feels weak.  Inpatient Medications    Scheduled Meds: . apixaban  5 mg Oral BID  . diltiazem  240 mg Oral Daily  . ferrous sulfate  325 mg Oral Q breakfast  . furosemide  40 mg Oral Daily  . insulin aspart  0-5 Units Subcutaneous QHS  . insulin aspart  0-9 Units Subcutaneous TID WC  . insulin glargine  17 Units Subcutaneous Daily  . levothyroxine  75 mcg Oral QAC breakfast  . metFORMIN  1,000 mg Oral BID WC  . metoCLOPramide (REGLAN) injection  10 mg Intravenous Q6H  . metoprolol succinate  50 mg Oral BID  . multivitamin with minerals  1 tablet Oral Daily  . pantoprazole  40 mg Oral Daily  . potassium chloride  10 mEq Oral Daily  . simvastatin  40 mg Oral QPM  . sodium chloride flush  10 mL Intravenous Q12H  . vitamin B-12  1,000 mcg Oral Daily   Continuous Infusions: . sodium chloride 10 mL/hr at 10/20/18 1716  . cefTRIAXone (ROCEPHIN)  IV 1 g (10/21/18 1008)  . diltiazem (CARDIZEM) infusion Stopped (10/19/18 1411)  . diltiazem (CARDIZEM) infusion 15 mg/hr (10/21/18 1110)   PRN Meds: sodium chloride, acetaminophen **OR** acetaminophen, ondansetron **OR** ondansetron (ZOFRAN) IV, phenol, polyethylene glycol, temazepam   Vital Signs    Vitals:   10/20/18 2300 10/20/18 2330 10/21/18 0415 10/21/18 0756  BP: (!) 107/49 (!) 113/51 126/61 (!) 119/53  Pulse: 71 71 68 69  Resp:   16 18  Temp:   97.7 F (36.5 C) 98.1 F (36.7 C)  TempSrc:   Oral Oral  SpO2:   100% 96%  Weight:   94.6 kg   Height:        Intake/Output Summary (Last 24 hours) at 10/21/2018 1112 Last data filed at 10/21/2018  0300 Gross per 24 hour  Intake 797.25 ml  Output 200 ml  Net 597.25 ml   Last 3 Weights 10/21/2018 10/20/2018 10/19/2018  Weight (lbs) 208 lb 8 oz 206 lb 203 lb 7.8 oz  Weight (kg) 94.575 kg 93.441 kg 92.3 kg      Telemetry    Atrial flutter with variable ventricular rate.- Personally Reviewed  ECG     - Personally Reviewed  Physical Exam   GEN: No acute distress.   Neck: No JVD Cardiac: Irregularly irregular, no murmurs, rubs, or gallops.  Respiratory:  Mild expiratory wheezing GI: Soft, nontender, non-distended  MS: No edema; No deformity. Neuro:  Nonfocal  Psych: Normal affect   Labs    Chemistry Recent Labs  Lab 10/17/18 1440 10/18/18 0442 10/19/18 0528  NA 136 138 138  K 4.7 4.0 4.3  CL 103 105 102  CO2 24 26 28   GLUCOSE 167* 144* 128*  BUN 25* 24* 21  CREATININE 1.15* 1.03* 1.07*  CALCIUM 8.8* 8.6* 8.6*  GFRNONAA 44* 50* 48*  GFRAA 51* 58* 55*  ANIONGAP 9 7 8      Hematology Recent Labs  Lab 10/18/18 0442 10/19/18 0528 10/20/18 0414  WBC 8.8 10.9* 10.7*  RBC 4.36 4.65 4.75  HGB  11.6* 12.4 12.6  HCT 36.6 39.8 40.8  MCV 83.9 85.6 85.9  MCH 26.6 26.7 26.5  MCHC 31.7 31.2 30.9  RDW 18.3* 18.2* 17.6*  PLT 233 243 238    Cardiac Enzymes Recent Labs  Lab 10/17/18 1440 10/17/18 1708 10/17/18 2249 10/18/18 0442  TROPONINI 0.05* 0.05* 0.05* 0.05*   No results for input(s): TROPIPOC in the last 168 hours.   BNP Recent Labs  Lab 10/17/18 1440  BNP 728.0*     DDimer No results for input(s): DDIMER in the last 168 hours.   Radiology    No results found.  Cardiac Studies   Echocardiogram in October 2019:  ECHO  Study Conclusions  - Left ventricle: The cavity size was normal. Systolic function was normal. The estimated ejection fraction was in the range of 60% to 65%. Wall motion was normal; there were no regional wall motion abnormalities. Features are consistent with a pseudonormal left ventricular filling pattern, with  concomitant abnormal relaxation and increased filling pressure (grade 2 diastolic dysfunction). - Mitral valve: Calcified annulus. Mildly thickened, mildly calcified leaflets . There was mild regurgitation. - Left atrium: The atrium was mildly to moderately dilated. - Tricuspid valve: There was moderate regurgitation. - Pulmonary arteries: Systolic pressure was moderately increased. PA peak pressure: 52 mm Hg (S).   Patient Profile     85 y.o. female who presented to the hospital with tachycardia, dizziness and noted to be in flutter with rapid rate.    Assessment & Plan    1.  Atrial flutter: Ventricular rate is more controlled than before it continues to be not optimal.  She did have an episode of tachycardia this morning the heart rate of 130 bpm.  She is currently on diltiazem extended release 240 mg once daily and metoprolol extended release 50 mg twice daily.  Not able to increase the dosages given relatively low blood pressure and I am worried about the possibility of causing bradycardia on higher doses especially with her age. I think her best option is to proceed with TEE guided cardioversion.  I discussed the procedure in details as well as risks and benefits.  We will schedule this for tomorrow morning.  2.  Chronic diastolic heart failure: She is currently on oral furosemide 40 mg once daily.  She appears to be euvolemic.     For questions or updates, please contact [place] Please consult [email or web address] for contact info under        Signed, Kathlyn Sacramento, MD  10/21/2018, 11:12 AM

## 2018-10-22 ENCOUNTER — Inpatient Hospital Stay
Admit: 2018-10-22 | Discharge: 2018-10-22 | Disposition: A | Discharge status: Home / Self Care | Payer: PPO | Attending: Internal Medicine | Admitting: Internal Medicine

## 2018-10-22 ENCOUNTER — Other Ambulatory Visit: Payer: Self-pay

## 2018-10-22 ENCOUNTER — Encounter
Admission: EM | Disposition: A | Discharge status: Skilled Nursing Facility | Payer: Self-pay | Source: Home / Self Care | Attending: Family Medicine

## 2018-10-22 ENCOUNTER — Inpatient Hospital Stay: Payer: PPO

## 2018-10-22 ENCOUNTER — Inpatient Hospital Stay: Payer: PPO | Admitting: Anesthesiology

## 2018-10-22 ENCOUNTER — Encounter: Payer: Self-pay | Admitting: Internal Medicine

## 2018-10-22 DIAGNOSIS — I5033 Acute on chronic diastolic (congestive) heart failure: Secondary | ICD-10-CM

## 2018-10-22 DIAGNOSIS — J988 Other specified respiratory disorders: Secondary | ICD-10-CM

## 2018-10-22 DIAGNOSIS — I340 Nonrheumatic mitral (valve) insufficiency: Secondary | ICD-10-CM

## 2018-10-22 DIAGNOSIS — I5032 Chronic diastolic (congestive) heart failure: Secondary | ICD-10-CM

## 2018-10-22 HISTORY — PX: TEE WITHOUT CARDIOVERSION: SHX5443

## 2018-10-22 HISTORY — PX: CARDIOVERSION: EP1203

## 2018-10-22 LAB — BASIC METABOLIC PANEL
Anion gap: 6 (ref 5–15)
BUN: 25 mg/dL — ABNORMAL HIGH (ref 8–23)
CO2: 30 mmol/L (ref 22–32)
Calcium: 8.6 mg/dL — ABNORMAL LOW (ref 8.9–10.3)
Chloride: 98 mmol/L (ref 98–111)
Creatinine, Ser: 1.17 mg/dL — ABNORMAL HIGH (ref 0.44–1.00)
GFR calc Af Amer: 50 mL/min — ABNORMAL LOW (ref >60)
GFR calc non Af Amer: 43 mL/min — ABNORMAL LOW (ref >60)
Glucose, Bld: 171 mg/dL — ABNORMAL HIGH (ref 70–99)
Potassium: 4.4 mmol/L (ref 3.5–5.1)
Sodium: 134 mmol/L — ABNORMAL LOW (ref 135–145)

## 2018-10-22 LAB — MAGNESIUM: Magnesium: 1.5 mg/dL — ABNORMAL LOW (ref 1.7–2.4)

## 2018-10-22 LAB — GLUCOSE, CAPILLARY
Glucose-Capillary: 174 mg/dL — ABNORMAL HIGH (ref 70–99)
Glucose-Capillary: 196 mg/dL — ABNORMAL HIGH (ref 70–99)
Glucose-Capillary: 198 mg/dL — ABNORMAL HIGH (ref 70–99)
Glucose-Capillary: 257 mg/dL — ABNORMAL HIGH (ref 70–99)

## 2018-10-22 SURGERY — ECHOCARDIOGRAM, TRANSESOPHAGEAL
Anesthesia: Monitor Anesthesia Care

## 2018-10-22 SURGERY — CARDIOVERSION (CATH LAB)
Anesthesia: General

## 2018-10-22 SURGERY — ECHOCARDIOGRAM, TRANSESOPHAGEAL
Anesthesia: Moderate Sedation

## 2018-10-22 MED ORDER — LORAZEPAM 1 MG PO TABS
1.0000 mg | ORAL_TABLET | Freq: Once | ORAL | Status: AC
  Administered 2018-10-22: 1 mg via ORAL
  Filled 2018-10-22: qty 1

## 2018-10-22 MED ORDER — BUTAMBEN-TETRACAINE-BENZOCAINE 2-2-14 % EX AERO
INHALATION_SPRAY | CUTANEOUS | Status: AC
  Filled 2018-10-22: qty 5

## 2018-10-22 MED ORDER — LIDOCAINE VISCOUS HCL 2 % MT SOLN
OROMUCOSAL | Status: AC
  Administered 2018-10-22: 08:00:00
  Filled 2018-10-22: qty 15

## 2018-10-22 MED ORDER — SODIUM CHLORIDE 0.9 % IV SOLN: INTRAVENOUS | Status: DC

## 2018-10-22 MED ORDER — HYDRALAZINE HCL 20 MG/ML IJ SOLN: 10.0000 mg | INTRAMUSCULAR | Status: DC | PRN

## 2018-10-22 MED ORDER — PROPOFOL 10 MG/ML IV BOLUS
INTRAVENOUS | Status: DC | PRN
  Administered 2018-10-22: 30 mg via INTRAVENOUS
  Administered 2018-10-22: 20 mg via INTRAVENOUS

## 2018-10-22 NOTE — Anesthesia Postprocedure Evaluation (Signed)
Anesthesia Post Note  Patient: Diane Mullins  Procedure(s) Performed: CARDIOVERSION (N/A ) TRANSESOPHAGEAL ECHOCARDIOGRAM (TEE) (N/A )  Patient location during evaluation: Other (specials recovery) Anesthesia Type: General Level of consciousness: awake and alert and oriented Pain management: pain level controlled Vital Signs Assessment: post-procedure vital signs reviewed and stable Respiratory status: spontaneous breathing, nonlabored ventilation and respiratory function stable Cardiovascular status: blood pressure returned to baseline and stable Postop Assessment: no signs of nausea or vomiting Anesthetic complications: no   Bradycardic post cardioversion, plan to return to stepdown unit for monitoring  Last Vitals:  Vitals:   10/22/18 1200 10/22/18 1238  BP: (!) 175/65 (!) 139/53  Pulse: (!) 50 (!) 55  Resp: 19   Temp:  36.8 C  SpO2: 98% 99%    Last Pain:  Vitals:   10/22/18 1238  TempSrc: Oral  PainSc:                  Lindsy Cerullo

## 2018-10-22 NOTE — CV Procedure (Signed)
    Cardioversion Note  Diane Mullins [id number] 06-10-1933  Procedure: DC Cardioversion Indications: Atrial flutter  Procedure Details Consent: Obtained Time Out: Verified patient identification, verified procedure, site/side was marked, verified correct patient position, special equipment/implants available, Radiology Safety Procedures followed,  medications/allergies/relevent history reviewed, required imaging and test results available.  Performed  The patient has been on adequate anticoagulation.  The patient received IV propofol by anesthesia for sedation.  Synchronous cardioversion was performed at 120 joules.  The cardioversion was successful with alternating sinus bradycardia and junctional rhythm after cardioversion.  Complications: No apparent complications Patient did tolerate procedure well.  Nelva Bush., MD 10/22/2018, 9:08 AM

## 2018-10-22 NOTE — Progress Notes (Addendum)
Specials nurse notified patient will be going directly to the ICU from there. She is having irregular heart rhythm with bradycardia. She will not be coming back up to 2A.

## 2018-10-22 NOTE — Progress Notes (Addendum)
As rounding on patient after coming back form procedure, patient was slurring. Family member at bedside, stated that this has been going on since admission but is not her baseline. Per family member this has been brought up to staff and per cardiology this could be caused by the "heart medication" cardizem. Dr. Jerelyn Charles paged to notify about his. Will continue to monitor patient.   Update: orders put in by Dr. Jerelyn Charles to rule out stroke.

## 2018-10-22 NOTE — Progress Notes (Signed)
Progress Note  Patient Name: Diane Mullins Date of Encounter: 10/22/2018  Primary Cardiologist: [place] predominantly of sore through and cough.  No chest pain or significant shortness of breath unless moving around.  Patient had significant desaturation during TEE (limited study performed to exclude intraatrial thrombus).  She underwent successful DCCV with subsequent bradycardia.  Inpatient Medications    Scheduled Meds: . apixaban  5 mg Oral BID  . butamben-tetracaine-benzocaine      . feeding supplement (GLUCERNA SHAKE)  237 mL Oral TID BM  . ferrous sulfate  325 mg Oral Q breakfast  . furosemide  40 mg Oral Daily  . insulin aspart  0-5 Units Subcutaneous QHS  . insulin aspart  0-9 Units Subcutaneous TID WC  . insulin glargine  17 Units Subcutaneous Daily  . levothyroxine  75 mcg Oral QAC breakfast  . metFORMIN  1,000 mg Oral BID WC  . metoCLOPramide (REGLAN) injection  10 mg Intravenous Q6H  . multivitamin with minerals  1 tablet Oral Daily  . pantoprazole  40 mg Oral Daily  . potassium chloride  10 mEq Oral Daily  . simvastatin  40 mg Oral QPM  . sodium chloride flush  10 mL Intravenous Q12H  . sodium chloride flush  3 mL Intravenous Q12H  . vitamin B-12  1,000 mcg Oral Daily   Continuous Infusions: . sodium chloride 10 mL/hr at 10/20/18 1716  . sodium chloride     PRN Meds: sodium chloride, acetaminophen **OR** acetaminophen, guaiFENesin, ipratropium-albuterol, ondansetron **OR** ondansetron (ZOFRAN) IV, phenol, polyethylene glycol, sodium chloride flush, temazepam   Vital Signs    Vitals:   10/22/18 1015 10/22/18 1030 10/22/18 1045 10/22/18 1100  BP: (!) 166/64 (!) 165/67 (!) 169/65 (!) 168/66  Pulse: (!) 46 (!) 49 (!) 47 (!) 49  Resp: 18 18 18 17   Temp:      TempSrc:      SpO2: 97% 92% 94% 95%  Weight:      Height:        Intake/Output Summary (Last 24 hours) at 10/22/2018 1118 Last data filed at 10/22/2018 0855 Gross per 24  hour  Intake 340 ml  Output 1150 ml  Net -810 ml   Last 3 Weights 10/22/2018 10/22/2018 10/21/2018  Weight (lbs) 211 lb 3.2 oz 211 lb 3.2 oz 208 lb 8 oz  Weight (kg) 95.8 kg 95.8 kg 94.575 kg      Telemetry    Atrial flutter with ventricular rate of 60-80 at rest but spikes to 140 bpm with activity.  Post DCCV, telemetry shows sinus bradycardia and junctional rhythm with PAC's - Personally Reviewed  ECG    Junctional rhythm with lateral T-wave inversions - Personally Reviewed  Physical Exam   GEN: No acute distress.   Neck: No JVD Cardiac: Bradycardic with occasional extrasystoles Respiratory: Scattered wheezes and rhonchi with fair air movement.  No crackles. GI: Soft, nontender, non-distended  MS: No edema; No deformity. Neuro:  Nonfocal  Psych: Normal affect   Labs    Chemistry Recent Labs  Lab 10/18/18 0442 10/19/18 0528 10/22/18 0614  NA 138 138 134*  K 4.0 4.3 4.4  CL 105 102 98  CO2 26 28 30   GLUCOSE 144* 128* 171*  BUN 24* 21 25*  CREATININE 1.03* 1.07* 1.17*  CALCIUM 8.6* 8.6* 8.6*  GFRNONAA 50* 48* 43*  GFRAA 58* 55* 50*  ANIONGAP 7 8 6      Hematology Recent Labs  Lab  10/18/18 0442 10/19/18 0528 10/20/18 0414  WBC 8.8 10.9* 10.7*  RBC 4.36 4.65 4.75  HGB 11.6* 12.4 12.6  HCT 36.6 39.8 40.8  MCV 83.9 85.6 85.9  MCH 26.6 26.7 26.5  MCHC 31.7 31.2 30.9  RDW 18.3* 18.2* 17.6*  PLT 233 243 238    Cardiac Enzymes Recent Labs  Lab 10/17/18 1440 10/17/18 1708 10/17/18 2249 10/18/18 0442  TROPONINI 0.05* 0.05* 0.05* 0.05*   No results for input(s): TROPIPOC in the last 168 hours.   BNP Recent Labs  Lab 10/17/18 1440  BNP 728.0*     DDimer No results for input(s): DDIMER in the last 168 hours.   Radiology    No results found.  Cardiac Studies   Echocardiogram in October 2019:  ECHO  Study Conclusions  - Left ventricle: The cavity size was normal. Systolic function was normal. The estimated ejection fraction was in the  range of 60% to 65%. Wall motion was normal; there were no regional wall motion abnormalities. Features are consistent with a pseudonormal left ventricular filling pattern, with concomitant abnormal relaxation and increased filling pressure (grade 2 diastolic dysfunction). - Mitral valve: Calcified annulus. Mildly thickened, mildly calcified leaflets . There was mild regurgitation. - Left atrium: The atrium was mildly to moderately dilated. - Tricuspid valve: There was moderate regurgitation. - Pulmonary arteries: Systolic pressure was moderately increased. PA peak pressure: 52 mm Hg (S).  Patient Profile     85 y.o. female who presented to the hospital with tachycardia, dizziness and noted to be in flutter with rapid rate.  Assessment & Plan    Atrial flutter Adequately rate controlled at rest but still significant HR spikes with minimal activity despite aggressive AV nodal blockade.  Patient underwent TEE-guided DCCV this morning with subsequent bradycardia (junctional rhythm and sinus bradycardia).  HR is improving and patient remains asymptomatic.  Hold all SA/AV nodal blockers, including diltiazem and metoprolol.  If patient remains bradycardic with junctional rhythm, will need to transfer to stepdown.  Hopefully, sinus node function will recover once diltiazem/metoprolol have washed out.  Continue apixaban 5 mg BID.  Chronic diastolic heart failure Patient appears euvolemic.  Continue furosemide 40 mg daily.  Respiratory tract infection Patient reports sore throat as well as cough.  Exam notable for wheezes and rhonchi (no h/o asthma/COPD).  Supportive care and further workup per internal medicine.  For questions or updates, please contact [place] Please consult [email or web address] for contact info under [facility] Card[facility].  Signed, Nelva Bush, MD  10/22/2018, 11:18 AM

## 2018-10-22 NOTE — Anesthesia Preprocedure Evaluation (Signed)
Anesthesia Evaluation  Patient identified by MRN, date of birth, ID band Patient awake    Reviewed: Allergy & Precautions, NPO status , Patient's Chart, lab work & pertinent test results  History of Anesthesia Complications Negative for: history of anesthetic complications  Airway Mallampati: III  TM Distance: >3 FB Neck ROM: Full    Dental no notable dental hx.    Pulmonary shortness of breath, neg sleep apnea, neg COPD,    + rhonchi  (-) wheezing      Cardiovascular hypertension, + CAD and +CHF  (-) Past MI, (-) Cardiac Stents and (-) CABG + dysrhythmias Atrial Fibrillation  Rhythm:Regular Rate:Normal - Systolic murmurs and - Diastolic murmurs    Neuro/Psych neg Seizures PSYCHIATRIC DISORDERS Anxiety TIACVA    GI/Hepatic negative GI ROS, Neg liver ROS,   Endo/Other  diabetes, Oral Hypoglycemic AgentsHypothyroidism   Renal/GU Renal InsufficiencyRenal disease     Musculoskeletal negative musculoskeletal ROS (+)   Abdominal (+) + obese,   Peds  Hematology negative hematology ROS (+)   Anesthesia Other Findings Past Medical History: 07/17/2014: Benign paroxysmal positional vertigo No date: Chronic diastolic CHF (congestive heart failure) (HCC)     Comment:  a. echo 10/19: EF of 60-65%, no RWMA, Gr2DD, calcified               mitral annulus with mild MR, mildly to moderately dilated              left atrium, moderate TR, PASP 52 mmHg No date: Diabetes mellitus No date: H/O: rheumatic fever No date: Hyperlipidemia No date: Hypertension No date: Pulmonary hypertension (HCC) No date: Stroke [facility])     Comment:  TIA Jan. 1st No date: Teratoma of left ovary No date: Thyroid disease   Reproductive/Obstetrics                             Anesthesia Physical Anesthesia Plan  ASA: III  Anesthesia Plan: General   Post-op Pain Management:    Induction: Intravenous  PONV Risk Score and  Plan: 2 and Propofol infusion  Airway Management Planned: Natural Airway  Additional Equipment:   Intra-op Plan:   Post-operative Plan:   Informed Consent: I have reviewed the patients History and Physical, chart, labs and discussed the procedure including the risks, benefits and alternatives for the proposed anesthesia with the patient or authorized representative who has indicated his/her understanding and acceptance.     Dental advisory given  Plan Discussed with: CRNA and Anesthesiologist  Anesthesia Plan Comments:         Anesthesia Quick Evaluation

## 2018-10-22 NOTE — Consult Note (Signed)
ANTICOAGULATION CONSULT NOTE - Initial Consult  Pharmacy Consult for Apixaban Indication: atrial fibrillation  Allergies  Allergen Reactions  . Oxycodone     Other reaction(s): Confusion Patient's daughter reported confusion/sensitivity to oxycodone.     Patient Measurements: Height: 5\' 1"  (154.9 cm) Weight: 211 lb 3.2 oz (95.8 kg) IBW/kg (Calculated) : 47.8   Vital Signs: Temp: 98.3 F (36.8 C) (02/04 1238) Temp Source: Oral (02/04 1238) BP: 139/53 (02/04 1238) Pulse Rate: 55 (02/04 1238)  Labs: Recent Labs    10/20/18 0414 10/22/18 0614  HGB 12.6  --   HCT 40.8  --   PLT 238  --   CREATININE  --  1.17*    Estimated Creatinine Clearance: 37.9 mL/min (A) (by C-G formula based on SCr of 1.17 mg/dL (H)).   Medical History: Past Medical History:  Diagnosis Date  . Benign paroxysmal positional vertigo 07/17/2014  . Chronic diastolic CHF (congestive heart failure) ([place])    a. echo 10/19: EF of 60-65%, no RWMA, Gr2DD, calcified mitral annulus with mild MR, mildly to moderately dilated left atrium, moderate TR, PASP 52 mmHg  . Diabetes mellitus   . H/O: rheumatic fever   . Hyperlipidemia   . Hypertension   . Pulmonary hypertension ([place])   . Stroke [facility])    TIA Jan. 1st  . Teratoma of left ovary   . Thyroid disease     Medications:  Scheduled:  . apixaban  5 mg Oral BID  . feeding supplement (GLUCERNA SHAKE)  237 mL Oral TID BM  . ferrous sulfate  325 mg Oral Q breakfast  . furosemide  40 mg Oral Daily  . insulin aspart  0-5 Units Subcutaneous QHS  . insulin aspart  0-9 Units Subcutaneous TID WC  . insulin glargine  17 Units Subcutaneous Daily  . levothyroxine  75 mcg Oral QAC breakfast  . metFORMIN  1,000 mg Oral BID WC  . metoCLOPramide (REGLAN) injection  10 mg Intravenous Q6H  . multivitamin with minerals  1 tablet Oral Daily  . pantoprazole  40 mg Oral Daily  . potassium chloride  10 mEq Oral Daily  . simvastatin  40 mg Oral QPM  . sodium chloride  flush  10 mL Intravenous Q12H  . sodium chloride flush  3 mL Intravenous Q12H  . vitamin B-12  1,000 mcg Oral Daily    Assessment: 85 yo female with new afib. Was not on any PTA anticoagulants. Is transitioning from heparin to apixaban.   Goal of Therapy:  Monitor platelets by anticoagulation protocol: Yes   Plan:  Continue Apixaban 5 mg PO BID.  Plan to counsel patient.  Forrest Moron, PharmD Clinical Pharmacist 10/22/2018 1:28 PM

## 2018-10-22 NOTE — Progress Notes (Signed)
*  PRELIMINARY RESULTS* Echocardiogram 2D Echocardiogram has been performed.  Diane Mullins 10/22/2018, 9:02 AM

## 2018-10-22 NOTE — CV Procedure (Signed)
    Transesophageal Echocardiogram Note  KADIAN BARCELLOS [id number] 11/17/1932  Procedure: Transesophageal Echocardiogram Indications: Atrial flutter  Procedure Details Consent: Obtained Time Out: Verified patient identification, verified procedure, site/side was marked, verified correct patient position, special equipment/implants available, Radiology Safety Procedures followed,  medications/allergies/relevent history reviewed, required imaging and test results available.  Performed  Medications:  During this procedure the patient is administered propofol by anesthesia.  Limited study to exclude thrombus.  Mitral Valve: At least mild regurgitation  Left Atrium/ Left atrial appendage: No thrombus  Right atrium: No thrombus.  Atrial septum: Intact by color Doppler    Complications: Transient hypoxia with anesthesia, which resolved after discontinuation of TEE and placement of oral airway. Patient did tolerate procedure well.  Nelva Bush, MD 10/22/2018, 9:06 AM

## 2018-10-22 NOTE — Interval H&P Note (Signed)
History and Physical Interval Note:  10/22/2018 8:25 AM  Diane Mullins  has presented today for TEE/cardioversion, with the diagnosis of atrial flutter The various methods of treatment have been discussed with the patient and family. After consideration of risks, benefits and other options for treatment, the patient has consented to  Procedure(s): CARDIOVERSION (N/A) TRANSESOPHAGEAL ECHOCARDIOGRAM (TEE) (N/A) as a surgical intervention .  The patient's history has been reviewed, patient examined, no change in status, stable for surgery.  I have reviewed the patient's chart and labs.  Questions were answered to the patient's satisfaction.     Carla Whilden

## 2018-10-22 NOTE — Progress Notes (Signed)
Pt's HR noted to drop to low 30s, Dr. Saunders Revel notified face to face right away.  Pt asymptomatic.  Per Dr. Saunders Revel, stepdown bed requested for closer monitoring of pt.  CCU charge nurse notified by [facility] charge nurse.

## 2018-10-22 NOTE — Anesthesia Post-op Follow-up Note (Signed)
Anesthesia QCDR form completed.        

## 2018-10-22 NOTE — Progress Notes (Addendum)
Patient has orders for MRI- per patient she will need some medication to calm her down before she goes. Dr. Jerelyn Charles paged to notify. Per Dr. Jerelyn Charles verbal orders with read back to give 1mg  Ativan PO once. Per Dr.Salary ok to go ahead and do the MRI today instead of tomorrow. Patients heart rate has been in the 50s. MD aware.

## 2018-10-22 NOTE — Transfer of Care (Signed)
Immediate Anesthesia Transfer of Care Note  Patient: Diane Mullins  Procedure(s) Performed: CARDIOVERSION (N/A ) TRANSESOPHAGEAL ECHOCARDIOGRAM (TEE) (N/A )  Patient Location: spu  Anesthesia Type:General  Level of Consciousness: awake and alert   Airway & Oxygen Therapy: Patient Spontanous Breathing, Patient connected to face mask oxygen and non-rebreather face mask  Post-op Assessment: Report given to RN, Post -op Vital signs reviewed and stable and Patient moving all extremities  Post vital signs: Reviewed  Last Vitals:  Vitals Value Taken Time  BP 148/88 10/22/2018  8:55 AM  Temp    Pulse 48 10/22/2018  8:57 AM  Resp 24 10/22/2018  8:57 AM  SpO2 100 % 10/22/2018  8:57 AM  Vitals shown include unvalidated device data.  Last Pain:  Vitals:   10/21/18 1957  TempSrc: Oral  PainSc:          Complications: No apparent anesthesia complications

## 2018-10-22 NOTE — Progress Notes (Addendum)
[place] [facility] [place] NAME: Diane Mullins    MR#:  [id number]  DATE OF BIRTH:  1932/12/18  SUBJECTIVE:  Patient status post cardioversion by cardiology-developed asystole followed by bradycardia which did improve, and discussion with Dr. N/cardiology-okay to transfer back to telemetry bed/avoid AV nodal blocking agents for now, family concern for slurred speech since admission in discussion with nursing staff, will check MRI of the brain for further evaluation   REVIEW OF SYSTEMS:    ROS  CONSTITUTIONAL: No documented fever. No fatigue, weakness. No weight gain, no weight loss.  EYES: No blurry or double vision.  ENT: No tinnitus. No postnasal drip. No redness of the oropharynx.  RESPIRATORY: No cough, no wheeze, no hemoptysis. No dyspnea.  CARDIOVASCULAR: No chest pain. No orthopnea. Had palpitations. No syncope.  GASTROINTESTINAL: No nausea, no vomiting or diarrhea. No abdominal pain. No melena or hematochezia.  GENITOURINARY: No dysuria or hematuria.  ENDOCRINE: No polyuria or nocturia. No heat or cold intolerance.  HEMATOLOGY: No anemia. No bruising. No bleeding.  INTEGUMENTARY: No rashes. No lesions.  MUSCULOSKELETAL: No arthritis. No swelling. No gout.  NEUROLOGIC: No numbness, tingling, or ataxia. No seizure-type activity.  PSYCHIATRIC: No anxiety. No insomnia. No ADD.   DRUG ALLERGIES:   Allergies  Allergen Reactions  . Oxycodone     Other reaction(s): Confusion Patient's daughter reported confusion/sensitivity to oxycodone.     VITALS:  Blood pressure (!) 139/53, pulse (!) 55, temperature 98.3 F (36.8 C), temperature source Oral, resp. rate 19, height 5\' 1"  (1.549 m), weight 95.8 kg, SpO2 99 %.  PHYSICAL EXAMINATION:   Physical Exam  GENERAL:  85 y.o.-year-old patient lying in the bed with no acute distress.  EYES: Pupils equal, round, reactive to light and accommodation. No scleral icterus. Extraocular muscles intact.   HEENT: Head atraumatic, normocephalic. Oropharynx and nasopharynx clear.  NECK:  Supple, no jugular venous distention. No thyroid enlargement, no tenderness.  LUNGS: Normal breath sounds bilaterally, no wheezing, rales, rhonchi. No use of accessory muscles of respiration.  CARDIOVASCULAR: S1, S2 irregular. No murmurs, rubs, or gallops.  ABDOMEN: Soft, nontender, nondistended. Bowel sounds present. No organomegaly or mass.  EXTREMITIES: No cyanosis, clubbing or edema b/l.    NEUROLOGIC: Cranial nerves II through XII are intact. No focal Motor or sensory deficits b/l.   PSYCHIATRIC: The patient is alert and oriented x 3.  SKIN: No obvious rash, lesion, or ulcer.   LABORATORY PANEL:   CBC Recent Labs  Lab 10/20/18 0414  WBC 10.7*  HGB 12.6  HCT 40.8  PLT 238   ------------------------------------------------------------------------------------------------------------------ Chemistries  Recent Labs  Lab 10/22/18 0614  NA 134*  K 4.4  CL 98  CO2 30  GLUCOSE 171*  BUN 25*  CREATININE 1.17*  CALCIUM 8.6*  MG 1.5*   ------------------------------------------------------------------------------------------------------------------  Cardiac Enzymes Recent Labs  Lab 10/18/18 0442  TROPONINI 0.05*   ------------------------------------------------------------------------------------------------------------------  RADIOLOGY:  No results found.   ASSESSMENT AND PLAN:  85 year old elderly female patient with history of diabetes mellitus type 2, hypertension, TIA in the past, pulmonary hypertension, diastolic heart failure currently under hospitalist service for palpitations and dizziness  *Acute atrial flutter with RVR, new onset Resolved-status post cardioversion October 22, 2018 by Dr. Saunders Revel Per cardiology-continue to hold AV nodal blocking agents given bradycardia after cardioversion, possible discharge back to nursing facility on tomorrow, continue Eliquis  *Acute on  chronic diastolic heart failure exacerbation   Secondary to above  Stable Continue CHF protocol,  Lasix, strict I&O monitoring, daily weights, Coreg on hold given bradycardia status post cardioversion  *Chronic diabetes mellitus type 2  Stable on current regiment   *Acute UTI  Resolved Treated with course of Rocephin, urine culture noted for contaminant   *Chronic B12 deficiency Stable Continue vitamin B12  *Acute ?  Slurred speech Since admission per family, during my time with patient-I never noticed any slurred speech or neurological deficits, patient did not complain of any focal motor weakness or paresthesias Check MRI of the brain for further evaluation, neurochecks per routine, on Eliquis, on statin therapy, speech therapy to see  Disposition to independent living facility on tomorrow once cleared by cardiology  All the records are reviewed and case discussed with Care Management/Social Worker. Management plans discussed with the patient, family and they are in agreement.  CODE STATUS: Full code  DVT Prophylaxis: SCDs  TOTAL TIME TAKING CARE OF THIS PATIENT: 35 minutes.   POSSIBLE D/C IN 1-2 DAYS, DEPENDING ON CLINICAL CONDITION.  Diane Mullins M.D on 10/22/2018 at 1:24 PM  Between 7am to 6pm - Pager - [phone]  After 6pm go to [email or web address] - password [email or web address] [facility]  Office  [phone]  CC: Primary care physician; [name], MD  Note: This dictation was prepared with Dragon dictation along with smaller phrase technology. Any transcriptional errors that result from this process are unintentional.

## 2018-10-22 NOTE — Progress Notes (Signed)
PT Cancellation Note  Patient Details Name: LEINAALA CATANESE MRN: [id number] DOB: 08/29/1933   Cancelled Treatment:    Reason Eval/Treat Not Completed: Medical issues which prohibited therapy(Per chart review, patient to transfer directly to CCU post-procedure (cardioversion) due to irregular HR with bradycardia.  Per guidelines, will require new orders to resume care when medically appropriate.)   Gal Feldhaus H. Owens Shark, PT, DPT, NCS 10/22/18, 10:51 AM [phone]

## 2018-10-22 NOTE — Progress Notes (Signed)
SLP Cancellation Note  Patient Details Name: Diane Mullins MRN: [id number] DOB: [birth year]   Cancelled treatment:       Reason Eval/Treat Not Completed: Patient at procedure or test/unavailable. Chart reviewed; NSG and MD consulted. Pt given Ativan prior to MRI procedure; pt currently off the floor getting MRI to rule out possible stroke. D/t pt having procedure this morning and current MRI w/ medication, ST services will hold on ay cognitive-linguistic evaluation at this time, will follow-up tomorrow. NSG updated.    Emeline General, Graduate Student SLP 10/22/2018, 3:29 PM

## 2018-10-23 DIAGNOSIS — R4781 Slurred speech: Secondary | ICD-10-CM

## 2018-10-23 LAB — GLUCOSE, CAPILLARY
Glucose-Capillary: 138 mg/dL — ABNORMAL HIGH (ref 70–99)
Glucose-Capillary: 250 mg/dL — ABNORMAL HIGH (ref 70–99)
Glucose-Capillary: 172 mg/dL — ABNORMAL HIGH (ref 70–99)
Glucose-Capillary: 201 mg/dL — ABNORMAL HIGH (ref 70–99)

## 2018-10-23 MED ORDER — FUROSEMIDE 40 MG PO TABS
40.0000 mg | ORAL_TABLET | Freq: Two times a day (BID) | ORAL | Status: DC
  Administered 2018-10-23 – 2018-10-24 (×2): 40 mg via ORAL
  Filled 2018-10-23 (×2): qty 1

## 2018-10-23 NOTE — Progress Notes (Signed)
Progress Note  Patient Name: Diane Mullins Date of Encounter: 10/23/2018  Primary Cardiologist: Rockey Situ  Subjective   Mildly short of breath today.  Slurred/slow speech noted by family yesterday seems to be improving, per patient.  Sore throat also improved.  No chest pain or palpitations.  Inpatient Medications    Scheduled Meds: . apixaban  5 mg Oral BID  . ferrous sulfate  325 mg Oral Q breakfast  . furosemide  40 mg Oral Daily  . insulin aspart  0-5 Units Subcutaneous QHS  . insulin aspart  0-9 Units Subcutaneous TID WC  . insulin glargine  17 Units Subcutaneous Daily  . levothyroxine  75 mcg Oral QAC breakfast  . metFORMIN  1,000 mg Oral BID WC  . metoCLOPramide (REGLAN) injection  10 mg Intravenous Q6H  . multivitamin with minerals  1 tablet Oral Daily  . pantoprazole  40 mg Oral Daily  . potassium chloride  10 mEq Oral Daily  . simvastatin  40 mg Oral QPM  . sodium chloride flush  10 mL Intravenous Q12H  . sodium chloride flush  3 mL Intravenous Q12H  . vitamin B-12  1,000 mcg Oral Daily   Continuous Infusions: . sodium chloride Stopped (10/22/18 1119)  . sodium chloride     PRN Meds: sodium chloride, acetaminophen **OR** acetaminophen, guaiFENesin, hydrALAZINE, ipratropium-albuterol, ondansetron **OR** ondansetron (ZOFRAN) IV, phenol, polyethylene glycol, sodium chloride flush, temazepam   Vital Signs    Vitals:   10/22/18 1735 10/22/18 2046 10/23/18 0345 10/23/18 0943  BP:  (!) 124/54 (!) 135/50 (!) 158/59  Pulse:  60 64 69  Resp:  16 17 19   Temp:  97.6 F (36.4 C) 98.7 F (37.1 C)   TempSrc:  Oral Oral   SpO2: 94% 94% 90% 92%  Weight:   95.1 kg   Height:        Intake/Output Summary (Last 24 hours) at 10/23/2018 1323 Last data filed at 10/23/2018 1159 Gross per 24 hour  Intake -  Output 900 ml  Net -900 ml   Last 3 Weights 10/23/2018 10/22/2018 10/22/2018  Weight (lbs) 209 lb 9.6 oz 211 lb 3.2 oz 211 lb 3.2 oz  Weight (kg) 95.074 kg 95.8 kg 95.8 kg     Telemetry    Sinus rhythm and occasional junctional rhythm.  PACs as well as brief atrial runs noted.  No further atrial flutter following cardioversion. - Personally Reviewed  ECG    No new tracing.  Physical Exam   GEN: No acute distress.   Neck:  JVP approximately 8 cm. Cardiac: RRR, no murmurs, rubs, or gallops.  Respiratory:  Fair air movement with scattered wheezes and rhonchi. GI: Soft, nontender, non-distended  MS:  Trace pretibial edema bilaterally; No deformity. Neuro:  Nonfocal  Psych: Normal affect   Labs    Chemistry Recent Labs  Lab 10/18/18 0442 10/19/18 0528 10/22/18 0614  NA 138 138 134*  K 4.0 4.3 4.4  CL 105 102 98  CO2 26 28 30   GLUCOSE 144* 128* 171*  BUN 24* 21 25*  CREATININE 1.03* 1.07* 1.17*  CALCIUM 8.6* 8.6* 8.6*  GFRNONAA 50* 48* 43*  GFRAA 58* 55* 50*  ANIONGAP 7 8 6      Hematology Recent Labs  Lab 10/18/18 0442 10/19/18 0528 10/20/18 0414  WBC 8.8 10.9* 10.7*  RBC 4.36 4.65 4.75  HGB 11.6* 12.4 12.6  HCT 36.6 39.8 40.8  MCV 83.9 85.6 85.9  MCH 26.6 26.7 26.5  MCHC 31.7 31.2  30.9  RDW 18.3* 18.2* 17.6*  PLT 233 243 238    Cardiac Enzymes Recent Labs  Lab 10/17/18 1440 10/17/18 1708 10/17/18 2249 10/18/18 0442  TROPONINI 0.05* 0.05* 0.05* 0.05*   No results for input(s): TROPIPOC in the last 168 hours.   BNP Recent Labs  Lab 10/17/18 1440  BNP 728.0*     DDimer No results for input(s): DDIMER in the last 168 hours.   Radiology    Mr Brain Wo Contrast  Result Date: 10/22/2018 CLINICAL DATA:  Motor neuron disease. EXAM: MRI HEAD WITHOUT CONTRAST TECHNIQUE: Multiplanar, multiecho pulse sequences of the brain and surrounding structures were obtained without intravenous contrast. COMPARISON:  CT head 03/30/2016, MRI head 09/20/2015 FINDINGS: Brain: Image quality degraded by motion. Mild atrophy similar to the prior study. Moderate chronic white matter changes with mild progression. Negative for acute infarct.   Negative for hemorrhage or mass. Vascular: Normal arterial flow voids Skull and upper cervical spine: Negative Sinuses/Orbits: Negative Other: None IMPRESSION: No acute intracranial abnormality. Atrophy and moderate chronic microvascular ischemic change in the white matter. Electronically Signed   By: Franchot Gallo M.D.   On: 10/22/2018 15:33    Cardiac Studies   Echocardiogram in October 2019:  ECHO  Study Conclusions  - Left ventricle: The cavity size was normal. Systolic function was normal. The estimated ejection fraction was in the range of 60% to 65%. Wall motion was normal; there were no regional wall motion abnormalities. Features are consistent with a pseudonormal left ventricular filling pattern, with concomitant abnormal relaxation and increased filling pressure (grade 2 diastolic dysfunction). - Mitral valve: Calcified annulus. Mildly thickened, mildly calcified leaflets . There was mild regurgitation. - Left atrium: The atrium was mildly to moderately dilated. - Tricuspid valve: There was moderate regurgitation. - Pulmonary arteries: Systolic pressure was moderately increased. PA peak pressure: 52 mm Hg (S).  Patient Profile   85 y.o.femalewho presented to the hospital with tachycardia, dizziness and noted to be in atrial flutter with rapid rate.  Assessment & Plan    Atrial flutter Resolved following cardioversion yesterday.  Patient's sinus node function has improved since cardioversion with discontinuation of beta-blockade and diltiazem.  However, she still has some episodes of sinus bradycardia and junctional rhythm.  This is confounded by brief runs of atrial tachycardia.  Hold beta-blockers and calcium channel blockers.  Continue apixaban 5 mg twice daily.  Plan for Dr. Caryl Comes (EP) to evaluate the patient tomorrow and provide recommendations regarding management of PAT in the setting of continued sinus node dysfunction (hopefully sinus  node will improve as BB/CCB to need a washout).  Acute on chronic HFpEF Patient appears slightly more volume overloaded today.  Weight is also up over the last few days.  She still has some rhonchi on lung exam, which most likely reflect respiratory tract process.  However, an element of pulmonary edema may be contributing.  Increase furosemide to 40 mg p.o. twice daily.  Respiratory tract infection  Continued supportive care and further work-up per internal medicine.  Slow/slurred speech Per family, this has been present since admission.  Patient notes that it was only present following TEE/DCCV yesterday.  She does not have any difficulty swallowing at this time and speech seems to have improved.  I suspect anesthesia and topical lidocaine may have led to her altered speech.  Brain MRI showed no acute findings.  Per internal medicine.  Unlikely related to trauma from TEE.  For questions or updates, please contact [place] Please  consult [email or web address] for contact info under [facility] Cardiology.     Signed, Nelva Bush, MD  10/23/2018, 1:23 PM

## 2018-10-23 NOTE — Evaluation (Signed)
Physical Therapy Re-evaluation Patient Details Name: Diane Mullins MRN: [id number] DOB: [birth year] Today's Date: 10/23/2018   History of Present Illness  presented to ER secondary to dizziness, weakness; admitted for management of A-flutter.  Mild elevation in troponin (0.05) noted; currently on cardizem and heparin drip for medical management  Clinical Impression  Pt able to perform bed mobility and transfer to chair today with min A.  She required increased effort with bed mobility and PT noted wheezing which increased with activity (RN aware).  Pt able to sit at bedside and perform there ex to demonstrate understanding of exercises; pt open to education.  She was able to stand from bedside with min A, though stating that she felt "woozy" and appearing unsteady on feet so further ambulation was deferred.  Vitals remained WNL throughout treatment.  PT provided heavy VC's for use of RW for safety and stability.  Pt presented with overall fair strength of UE's and LE's and reported no sensation loss in LE's. Pt will continue to benefit from skilled PT with focus on strength, tolerance to activity and safe functional mobility.  SNF remains appropriate for discharge due to pt decrease in safe mobility.    Follow Up Recommendations SNF    Equipment Recommendations  None recommended by PT    Recommendations for Other Services       Precautions / Restrictions Precautions Precautions: Fall Restrictions Weight Bearing Restrictions: No      Mobility  Bed Mobility Overal bed mobility: Modified Independent             General bed mobility comments: Very slow to get to bedside but able with HOB raised.  Transfers Overall transfer level: Needs assistance Equipment used: Rolling walker (2 wheeled) Transfers: Sit to/from [facility] Sit to Stand: Min assist Stand pivot transfers: Min guard       General transfer comment: Pt able to rise from bedside with min A, unsteady  on feet and stating that she felt "woozy".  O2 and BP WNL but deferred walking for this reason.  REquired VC's for safe hand placement and body mechanics during transfer.  Ambulation/Gait                Stairs            Wheelchair Mobility    Modified Rankin (Stroke Patients Only)       Balance Overall balance assessment: Needs assistance Sitting-balance support: Feet supported Sitting balance-Leahy Scale: Good     Standing balance support: Bilateral upper extremity supported Standing balance-Leahy Scale: Fair Standing balance comment: Flexed posture and reliance on RW due to pt reported weakness.                             Pertinent Vitals/Pain Pain Assessment: No/denies pain    Home Living Family/patient expects to be discharged to:: [facility])   Available Help at Discharge: Family Type of Home: House           Additional Comments: Single level, no steps required.  Walk in shower with seat; grab bars throughout apartment.    Prior Function Level of Independence: Independent with assistive device(s)         Comments: Mod indep with ADLs, household distances with 4WRW; no driving.  Family local and available for check in intermittently.     Hand Dominance   Dominant Hand: Right    Extremity/Trunk Assessment   Upper Extremity  Assessment Upper Extremity Assessment: Overall WFL for tasks assessed(Grossly 4-/5 bilaterally)    Lower Extremity Assessment Lower Extremity Assessment: Overall WFL for tasks assessed(Grossly 4/4 bilaterally.)    Cervical / Trunk Assessment Cervical / Trunk Assessment: Kyphotic  Communication   Communication: HOH  Cognition Arousal/Alertness: Awake/alert Behavior During Therapy: WFL for tasks assessed/performed;Anxious Overall Cognitive Status: Within Functional Limits for tasks assessed                                 General Comments: Anxious but willing to work with  therapy.      General Comments General comments (skin integrity, edema, etc.): PT noted pt with wheezing and raspy cough increasing with activity and RN present as well.    Exercises Other Exercises Other Exercises: Seated ther ex: ankle pumps, LAQ x5 each, education regarding importance of HEP between therapy sessions. Other Exercises: Time to monitor vitals and assist with changing pt's gown and bedding while pt in chair. x6 min   Assessment/Plan    PT Assessment Patient needs continued PT services  PT Problem List Decreased strength;Decreased activity tolerance;Decreased balance;Decreased mobility;Decreased safety awareness;Decreased knowledge of precautions;Decreased knowledge of use of DME;Cardiopulmonary status limiting activity       PT Treatment Interventions DME instruction;Gait training;Functional mobility training;Therapeutic activities;Therapeutic exercise;Balance training;Patient/family education    PT Goals (Current goals can be found in the Care Plan section)  Acute Rehab PT Goals Patient Stated Goal: to feel better PT Goal Formulation: With patient Time For Goal Achievement: 11/01/18 Potential to Achieve Goals: Good    Frequency Min 2X/week   Barriers to discharge Decreased caregiver support      Co-evaluation               AM-PAC PT "6 Clicks" Mobility  Outcome Measure Help needed turning from your back to your side while in a flat bed without using bedrails?: A Little Help needed moving from lying on your back to sitting on the side of a flat bed without using bedrails?: A Little Help needed moving to and from a bed to a chair (including a wheelchair)?: A Little Help needed standing up from a chair using your arms (e.g., wheelchair or bedside chair)?: A Lot Help needed to walk in hospital room?: A Lot Help needed climbing 3-5 steps with a railing? : A Lot 6 Click Score: 15    End of Session Equipment Utilized During Treatment: Gait belt Activity  Tolerance: Patient limited by fatigue Patient left: with call bell/phone within reach;with nursing/sitter in room;in chair;with chair alarm set Nurse Communication: Mobility status PT Visit Diagnosis: Muscle weakness (generalized) (M62.81);Difficulty in walking, not elsewhere classified (R26.2)    Time: 9371-6967 PT Time Calculation (min) (ACUTE ONLY): 24 min   Charges:   PT Evaluation $PT Re-evaluation: 1 Re-eval PT Treatments $Therapeutic Exercise: 8-22 mins $Therapeutic Activity: 8-22 mins        Roxanne Gates, PT, DPT  Roxanne Gates 10/23/2018, 4:44 PM

## 2018-10-23 NOTE — Progress Notes (Signed)
PT Cancellation Note  Patient Details Name: SHOSHANAH DAPPER MRN: [id number] DOB: 08/24/1933   Cancelled Treatment:    Reason Eval/Treat Not Completed: Patient at procedure or test/unavailable.  Pt currently with several visitors in room.  RN unavailable to consult prior to session.  Will re-attempt after lunch.  Roxanne Gates, PT, DPT  Roxanne Gates 10/23/2018, 11:55 AM

## 2018-10-23 NOTE — Plan of Care (Signed)
  Problem: Clinical Measurements: Goal: Ability to maintain clinical measurements within normal limits will improve Outcome: Not Progressing Note:  Sodium level is low today at only 134. Will continue to monitor lab values. Wenda Low [facility]

## 2018-10-23 NOTE — Progress Notes (Addendum)
[place] [facility] [place] NAME: Diane Mullins    MR#:  [id number]  DATE OF BIRTH:  1933/04/18  SUBJECTIVE:  Patient status post cardioversion by cardiology, no complaints, physical therapy reconsulted, patient without complaint, MRI of the brain was negative study  REVIEW OF SYSTEMS:    ROS  CONSTITUTIONAL: No documented fever. No fatigue, weakness. No weight gain, no weight loss.  EYES: No blurry or double vision.  ENT: No tinnitus. No postnasal drip. No redness of the oropharynx.  RESPIRATORY: No cough, no wheeze, no hemoptysis. No dyspnea.  CARDIOVASCULAR: No chest pain. No orthopnea. Had palpitations. No syncope.  GASTROINTESTINAL: No nausea, no vomiting or diarrhea. No abdominal pain. No melena or hematochezia.  GENITOURINARY: No dysuria or hematuria.  ENDOCRINE: No polyuria or nocturia. No heat or cold intolerance.  HEMATOLOGY: No anemia. No bruising. No bleeding.  INTEGUMENTARY: No rashes. No lesions.  MUSCULOSKELETAL: No arthritis. No swelling. No gout.  NEUROLOGIC: No numbness, tingling, or ataxia. No seizure-type activity.  PSYCHIATRIC: No anxiety. No insomnia. No ADD.   DRUG ALLERGIES:   Allergies  Allergen Reactions  . Oxycodone     Other reaction(s): Confusion Patient's daughter reported confusion/sensitivity to oxycodone.     VITALS:  Blood pressure (!) 158/59, pulse 69, temperature 98.7 F (37.1 C), temperature source Oral, resp. rate 19, height 5\' 1"  (1.549 m), weight 95.1 kg, SpO2 92 %.  PHYSICAL EXAMINATION:   Physical Exam  GENERAL:  85 y.o.-year-old patient lying in the bed with no acute distress.  EYES: Pupils equal, round, reactive to light and accommodation. No scleral icterus. Extraocular muscles intact.  HEENT: Head atraumatic, normocephalic. Oropharynx and nasopharynx clear.  NECK:  Supple, no jugular venous distention. No thyroid enlargement, no tenderness.  LUNGS: Normal breath sounds bilaterally, no  wheezing, rales, rhonchi. No use of accessory muscles of respiration.  CARDIOVASCULAR: S1, S2 irregular. No murmurs, rubs, or gallops.  ABDOMEN: Soft, nontender, nondistended. Bowel sounds present. No organomegaly or mass.  EXTREMITIES: No cyanosis, clubbing or edema b/l.    NEUROLOGIC: Cranial nerves II through XII are intact. No focal Motor or sensory deficits b/l.   PSYCHIATRIC: The patient is alert and oriented x 3.  SKIN: No obvious rash, lesion, or ulcer.   LABORATORY PANEL:   CBC Recent Labs  Lab 10/20/18 0414  WBC 10.7*  HGB 12.6  HCT 40.8  PLT 238   ------------------------------------------------------------------------------------------------------------------ Chemistries  Recent Labs  Lab 10/22/18 0614  NA 134*  K 4.4  CL 98  CO2 30  GLUCOSE 171*  BUN 25*  CREATININE 1.17*  CALCIUM 8.6*  MG 1.5*   ------------------------------------------------------------------------------------------------------------------  Cardiac Enzymes Recent Labs  Lab 10/18/18 0442  TROPONINI 0.05*   ------------------------------------------------------------------------------------------------------------------  RADIOLOGY:  Mr Brain Wo Contrast  Result Date: 10/22/2018 CLINICAL DATA:  Motor neuron disease. EXAM: MRI HEAD WITHOUT CONTRAST TECHNIQUE: Multiplanar, multiecho pulse sequences of the brain and surrounding structures were obtained without intravenous contrast. COMPARISON:  CT head 03/30/2016, MRI head 09/20/2015 FINDINGS: Brain: Image quality degraded by motion. Mild atrophy similar to the prior study. Moderate chronic white matter changes with mild progression. Negative for acute infarct.  Negative for hemorrhage or mass. Vascular: Normal arterial flow voids Skull and upper cervical spine: Negative Sinuses/Orbits: Negative Other: None IMPRESSION: No acute intracranial abnormality. Atrophy and moderate chronic microvascular ischemic change in the white matter.  Electronically Signed   By: Franchot Gallo M.D.   On: 10/22/2018 15:33     ASSESSMENT AND PLAN:  85 year old elderly female patient with history of diabetes mellitus type 2, hypertension, TIA in the past, pulmonary hypertension, diastolic heart failure currently under hospitalist service for palpitations and dizziness  *Acute atrial flutter with RVR, new onset Resolved-s/p cardioversion October 22, 2018 by Dr. Saunders Revel Per cardiology-continue to hold AV nodal blocking agents given bradycardia after cardioversion, continue Eliquis, physical therapy to reevaluate  *Acute on chronic diastolic heart failure exacerbation   Secondary to above  Resolved Continue CHF protocol, Lasix, strict I&O monitoring, daily weights, Coreg on hold given bradycardia s/p cardioversion  *Chronic diabetes mellitus type 2  Stable on current regiment   *Acute UTI  Resolved Treated with course of Rocephin, urine culture noted for contaminant   *Chronic B12 deficiency Stable Continue vitamin B12  *Acute ?  Slurred speech Resolved Since admission per family, during my time with patient-I never noticed any slurred speech or neurological deficits, patient does not complain of any focal motor weakness or paresthesias MRI brain negative for any acute process, neurochecks per routine, on Eliquis,  statin therapy, speech therapy consulted  Disposition to independent living facility in 1 to 2 days barring any complications   all the records are reviewed and case discussed with Care Management/Social Worker. Management plans discussed with the patient, family and they are in agreement.  CODE STATUS: Full code  DVT Prophylaxis: SCDs  TOTAL TIME TAKING CARE OF THIS PATIENT: 35 minutes.   POSSIBLE D/C IN 1-2 DAYS, DEPENDING ON CLINICAL CONDITION.  Avel Peace Bladen Umar M.D on 10/23/2018 at 11:43 AM  Between 7am to 6pm - Pager - [phone]  After 6pm go to [email or web address] - password [email or web address] [place]  [facility]  Office  [phone]  CC: Primary care physician; [name], MD  Note: This dictation was prepared with Dragon dictation along with smaller phrase technology. Any transcriptional errors that result from this process are unintentional.

## 2018-10-23 NOTE — Evaluation (Addendum)
Speech Language Pathology Evaluation Patient Details Name: Diane Mullins MRN: [id number] DOB: 05/07/1933 Today's Date: 10/23/2018 Time: 7517-0017 SLP Time Calculation (min) (ACUTE ONLY): 35 min  Problem List:  Patient Active Problem List   Diagnosis Date Noted  . Atrial flutter ([place]) 10/17/2018  . 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 AKI (acute kidney injury) ([place])   . Acute on chronic diastolic heart failure ([place])   . Respiratory failure ([place]) 07/05/2018  . Paroxysmal tachycardia ([place]) 11/16/2017  . Coronary artery calcification seen on CAT scan 11/16/2017  . Morbid obesity ([place]) 10/30/2017  . (HFpEF) heart failure with preserved ejection fraction ([place]) 09/28/2017  . Family history of colon cancer 04/17/2017  . Closed right ankle fracture 10/28/2015  . Fatigue 10/18/2015  . TIA (transient ischemic attack) 09/18/2015  . Orthostatic hypotension 08/02/2015  . Adjustment disorder with anxious mood 03/30/2015  . Postmenopausal estrogen deficiency 06/03/2014  . DNR (do not resuscitate) discussion 03/04/2014  . Gait disturbance 05/07/2013  . Diabetes ([place]) 04/21/2013  . Shortness of breath 12/09/2012  . Hearing loss 12/09/2012  . Anxiety 01/31/2012  . Hypertension 12/22/2011  . Hyperlipidemia 12/22/2011  . Hypothyroidism 12/22/2011   Past Medical History:  Past Medical History:  Diagnosis Date  . Benign paroxysmal positional vertigo 07/17/2014  . Chronic diastolic CHF (congestive heart failure) ([place])    a. echo 10/19: EF of 60-65%, no RWMA, Gr2DD, calcified mitral annulus with mild MR, mildly to moderately dilated left atrium, moderate TR, PASP 52 mmHg  . Diabetes mellitus   . H/O: rheumatic fever   . Hyperlipidemia   . Hypertension   . Pulmonary hypertension ([place])   . Stroke [facility])    TIA Jan. 1st  . Teratoma of left ovary   . Thyroid disease    Past Surgical History:  Past Surgical History:  Procedure Laterality Date  . ABDOMINAL HYSTERECTOMY  1973   menorrhagia  .  CARDIOVERSION N/A 10/22/2018   Procedure: CARDIOVERSION;  Surgeon: Nelva Bush, MD;  Location: [facility] ORS;  Service: Cardiovascular;  Laterality: N/A;  . [place]  . ORIF ANKLE FRACTURE Right 10/29/2015   Procedure: OPEN REDUCTION INTERNAL FIXATION (ORIF) ANKLE FRACTURE;  Surgeon: Thornton Park, MD;  Location: [facility] ORS;  Service: Orthopedics;  Laterality: Right;  . TEE WITHOUT CARDIOVERSION N/A 10/22/2018   Procedure: TRANSESOPHAGEAL ECHOCARDIOGRAM (TEE);  Surgeon: Nelva Bush, MD;  Location: [facility] ORS;  Service: Cardiovascular;  Laterality: N/A;  . VAGINAL DELIVERY     x4   HPI:  Per admitting H&P: pt is a 85 y.o. female with a known history of hypertension, diabetes, prior history of TIA with no residual neurological deficits, pulmonary hypertension, diastolic CHF presents to hospital secondary to dizziness and weakness. Patient states she has been doing well up until 3 days ago.  She started feeling weak and more dizzy.  Worsening with standing.  Denies any chest pain or palpitations.  She recently had pulmonary function test done and that showed moderate restrictive lung disease.  Denies any worsening dyspnea.  She scheduled an appointment to see her physician assistant today and was noted to have a heart rate in the 140s and so sent to the ED. No prior history of atrial flutter or fibrillation in the past.  Takes an aspirin at home.  Labs in the ED showing normal TSH and metabolic panel, slightly elevated troponin of 0.05.  Chest x-ray with cardiomegaly but no acute findings.  Heart rate did not improve with Cardizem pushes and is started  on Cardizem drip.   Assessment / Plan / Recommendation Clinical Impression  Pt was reclined and asleep in bed upon ST entry w/ eaten breakfast tray in front of her on the table. Pt was easily aroused by ST and was alert and cooperative once upright throughout speech evaluation.  Last night, pt's Dtr requested ST consult, as she noticed pt's  speech was sounding "slurred". During evaluation, pt stated that she felt as though her speech was sounding "more clear than yesterday." ST found that pt was 100% intelligible, as an unfamiliar listener, however pt did seem to have slight distortion of /s/ sounds and seemed to become short of breath quickly, which in turn affected her vocal intensity d/t lack of breath support for lengthier sentences/speech. Brief OM exam appeared [facility] w/ no unilateral weakness noted. Pt also consumed thin liquids via straw w/ no oropharyngeal deficits noted. Pt's speech rate and fluency at the conversational level was [facility]. This patient has a history of respiratory failure & shortness of breath, which can physiologically impact one's ability to carry the voice in speech tasks. ST informally assessed pt's cognitive-linguistic status, by having pt complete the automatic speech tasks of reciting name, DOB, days of the week, months of the year; pt does not appear to exhibit any gross cognitive-linguistic deficits, as she completed these tasks w/ 100% accuracy. Pt also engaged in simple conversation w/ appropriate responses and pragmatics.  During recitation of days of the week & months of the year, ST noted that pt did seem to "run out" of air, which in turn affected her vocal intensity as well as precision of articulation. ST educated pt regarding speech strategies "big, slow, loud" (exaggerated articulation, slowed rate w/ more breath support, and increased vocal intensity). Pt completed 2 speech tasks w/ these techniques and both pt and ST noted improvement in clarity, precision, and volume. Pt expressed concern, asking if she now had to "change the way she talks"; ST educated pt that these techniques are often used when one is weak and can often return to baseline when other acute symptoms are regulated. ST provided the pt w/ an educational hand-out w/ speech technique reminders that she can practice and share w/ family members. PT  and OT consults to assist pt in improving strength and stamina which in turn can improve any shortness of breath. Recommend: practice conversing w/ speech techniques (big, loud, slow) w/ pauses in conversation w/ others.  Pt appears at her baseline w/ speech; pt agreed. She stated she had been "taking more medications recently". No further skilled ST services indicated at this time. Pt/family can reconsult ST services to f/u if any changes in cognitive-linguistic abilities noted post discharge and pt in home setting.      SLP Assessment  SLP Recommendation/Assessment: Patient does not need any further Speech Lanaguage Pathology Services SLP Visit Diagnosis: Dysarthria and anarthria (R47.1)    Follow Up Recommendations  N/A   Frequency and Duration  N/A        SLP Evaluation Cognition  Overall Cognitive Status: Within Functional Limits for tasks assessed Arousal/Alertness: Awake/alert Orientation Level: Oriented X4 Attention: Sustained Memory: Appears intact Awareness: Appears intact Problem Solving: Appears intact Executive Function: Reasoning;Sequencing;Initiating;Self Correcting Reasoning: Appears intact Sequencing: Appears intact Initiating: Appears intact Self Correcting: Appears intact Safety/Judgment: Appears intact       Comprehension  Auditory Comprehension Overall Auditory Comprehension: Appears within functional limits for tasks assessed Yes/No Questions: Within Functional Limits Commands: Within Functional Limits Conversation: Simple EffectiveTechniques: Extra processing  time;Increased volume;Stressing words Visual Recognition/Discrimination Discrimination: Not tested Reading Comprehension Reading Status: Within funtional limits    Expression Expression Primary Mode of Expression: Verbal Verbal Expression Overall Verbal Expression: Appears within functional limits for tasks assessed Initiation: No impairment Automatic Speech: Name;Social Response;Day of  week;Month of year Level of Generative/Spontaneous Verbalization: Conversation Repetition: No impairment Naming: No impairment Pragmatics: No impairment Interfering Components: Premorbid deficit;Anatomical limitation Effective Techniques: Articulatory cues Non-Verbal Means of Communication: Not applicable Written Expression Written Expression: Not tested   Oral / Motor  Oral Motor/Sensory Function Overall Oral Motor/Sensory Function: Within functional limits Motor Speech Overall Motor Speech: Appears within functional limits for tasks assessed Respiration: Impaired Level of Impairment: Sentence Phonation: Wet;Hoarse Resonance: Within functional limits Articulation: Impaired Level of Impairment: Sentence Intelligibility: Intelligible Motor Planning: Witnin functional limits Motor Speech Errors: Not applicable Effective Techniques: Slow rate;Increased vocal intensity;Over-articulate;Pause;Pacing   [place], Graduate Student SLP 10/23/2018, 1:50 PM

## 2018-10-23 NOTE — Plan of Care (Signed)
  Problem: Clinical Measurements: Goal: Will remain free from infection Outcome: Progressing   Problem: Activity: Goal: Risk for activity intolerance will decrease Outcome: Progressing   Problem: Education: Goal: Understanding of medication regimen will improve Outcome: Progressing   Problem: Activity: Goal: Ability to tolerate increased activity will improve Outcome: Progressing

## 2018-10-23 NOTE — Clinical Social Work Note (Addendum)
CSW has started insurance authorization for patient to go to [facility].  CSW awaiting insurance authorization, updated PT notes sent to [facility].  [name]. [place], MSW, [place]  10/23/2018 5:08 PM

## 2018-10-24 ENCOUNTER — Inpatient Hospital Stay: Payer: PPO

## 2018-10-24 LAB — BASIC METABOLIC PANEL
Anion gap: 9 (ref 5–15)
BUN: 24 mg/dL — ABNORMAL HIGH (ref 8–23)
CO2: 32 mmol/L (ref 22–32)
Calcium: 8.6 mg/dL — ABNORMAL LOW (ref 8.9–10.3)
Chloride: 93 mmol/L — ABNORMAL LOW (ref 98–111)
Creatinine, Ser: 1.03 mg/dL — ABNORMAL HIGH (ref 0.44–1.00)
GFR calc non Af Amer: 50 mL/min — ABNORMAL LOW (ref >60)
GFR, EST AFRICAN AMERICAN: 58 mL/min — AB (ref >60)
Glucose, Bld: 138 mg/dL — ABNORMAL HIGH (ref 70–99)
Potassium: 3.9 mmol/L (ref 3.5–5.1)
Sodium: 134 mmol/L — ABNORMAL LOW (ref 135–145)

## 2018-10-24 LAB — CREATININE, SERUM
Creatinine, Ser: 1.13 mg/dL — ABNORMAL HIGH (ref 0.44–1.00)
GFR calc Af Amer: 52 mL/min — ABNORMAL LOW (ref >60)
GFR calc non Af Amer: 45 mL/min — ABNORMAL LOW (ref >60)

## 2018-10-24 LAB — CBC
HCT: 37.6 % (ref 36.0–46.0)
Hemoglobin: 12 g/dL (ref 12.0–15.0)
MCH: 26.7 pg (ref 26.0–34.0)
MCHC: 31.9 g/dL (ref 30.0–36.0)
MCV: 83.6 fL (ref 80.0–100.0)
Platelets: 239 10*3/uL (ref 150–400)
RBC: 4.5 MIL/uL (ref 3.87–5.11)
RDW: 17.5 % — ABNORMAL HIGH (ref 11.5–15.5)
WBC: 7 10*3/uL (ref 4.0–10.5)
nRBC: 0 % (ref 0.0–0.2)

## 2018-10-24 LAB — GLUCOSE, CAPILLARY
GLUCOSE-CAPILLARY: 130 mg/dL — AB (ref 70–99)
Glucose-Capillary: 216 mg/dL — ABNORMAL HIGH (ref 70–99)
Glucose-Capillary: 181 mg/dL — ABNORMAL HIGH (ref 70–99)
Glucose-Capillary: 160 mg/dL — ABNORMAL HIGH (ref 70–99)

## 2018-10-24 LAB — BRAIN NATRIURETIC PEPTIDE: B Natriuretic Peptide: 304 pg/mL — ABNORMAL HIGH (ref 0.0–100.0)

## 2018-10-24 MED ORDER — IPRATROPIUM BROMIDE 0.02 % IN SOLN
0.5000 mg | Freq: Four times a day (QID) | RESPIRATORY_TRACT | Status: DC
  Administered 2018-10-24: 0.5 mg via RESPIRATORY_TRACT
  Filled 2018-10-24: qty 2.5

## 2018-10-24 MED ORDER — FUROSEMIDE 10 MG/ML IJ SOLN
40.0000 mg | Freq: Once | INTRAMUSCULAR | Status: AC
  Administered 2018-10-24: 40 mg via INTRAVENOUS
  Filled 2018-10-24: qty 4

## 2018-10-24 MED ORDER — LEVALBUTEROL HCL 0.63 MG/3ML IN NEBU
0.6300 mg | INHALATION_SOLUTION | Freq: Three times a day (TID) | RESPIRATORY_TRACT | Status: DC
  Administered 2018-10-24 – 2018-10-28 (×11): 0.63 mg via RESPIRATORY_TRACT
  Filled 2018-10-24 (×11): qty 3

## 2018-10-24 MED ORDER — LEVALBUTEROL HCL 0.63 MG/3ML IN NEBU
0.6300 mg | INHALATION_SOLUTION | Freq: Four times a day (QID) | RESPIRATORY_TRACT | Status: DC
  Administered 2018-10-24: 0.63 mg via RESPIRATORY_TRACT
  Filled 2018-10-24: qty 3

## 2018-10-24 MED ORDER — FUROSEMIDE 10 MG/ML IJ SOLN
60.0000 mg | Freq: Two times a day (BID) | INTRAMUSCULAR | Status: DC
  Administered 2018-10-24 – 2018-10-26 (×4): 60 mg via INTRAVENOUS
  Filled 2018-10-24 (×4): qty 6

## 2018-10-24 MED ORDER — IPRATROPIUM BROMIDE 0.02 % IN SOLN
0.5000 mg | Freq: Three times a day (TID) | RESPIRATORY_TRACT | Status: DC
  Administered 2018-10-24 – 2018-10-28 (×11): 0.5 mg via RESPIRATORY_TRACT
  Filled 2018-10-24 (×11): qty 2.5

## 2018-10-24 NOTE — Plan of Care (Signed)
  Problem: Cardiac: Goal: Ability to achieve and maintain adequate cardiopulmonary perfusion will improve Outcome: Progressing   

## 2018-10-24 NOTE — Care Management Important Message (Signed)
Copy of signed Medicare IM left with patient in room. 

## 2018-10-24 NOTE — Progress Notes (Signed)
Nutrition Follow Up Note   DOCUMENTATION CODES:   Not applicable  INTERVENTION:   MVI daily  B12 1000 mcg po daily   Low sodium diet education   NUTRITION DIAGNOSIS:   Inadequate oral intake related to acute illness as evidenced by per patient/family report  GOAL:   Patient will meet greater than or equal to 90% of their needs  -progressing   MONITOR:   PO intake, Supplement acceptance, Labs, Weight trends, Skin, I & O's  ASSESSMENT:   85 year old elderly female patient with history of diabetes mellitus type 2, hypertension, TIA in the past, pulmonary hypertension, diastolic heart failure currently under hospitalist service for palpitations and dizziness  Pt with improved appetite and oral intake; pt eating 80-100% of meals. Glucerna discontinued by MD. Per chart, pt with 5lb weight gain since admit. Pt with mild hyponatremia.   Medications reviewed and include: ferrous sulfate, lasix, insulin, synthroid, metformin, reglan, MVI, protonix, KCl, B12  Labs reviewed: Na 134(L), Cl 93(L), BUN 24(H), creat 1.03(H) cbgs- 171, 138 x 24 hrs AIC 9.6(H)- 1/30  Diet Order:   Diet Order            Diet heart healthy/carb modified Room service appropriate? Yes; Fluid consistency: Thin  Diet effective now             EDUCATION NEEDS:   Not appropriate for education at this time  Skin:  Skin Assessment: Reviewed RN Assessment  Last BM:  2/4- type 3  Height:   Ht Readings from Last 1 Encounters:  10/22/18 5\' 1"  (1.549 m)    Weight:   Wt Readings from Last 1 Encounters:  10/24/18 95.5 kg    Ideal Body Weight:  47.7 kg  BMI:  Body mass index is 39.78 kg/m.  Estimated Nutritional Needs:   Kcal:  1500-1800kcal/day   Protein:  95-105g/day   Fluid:  >1.2L/day   Koleen Distance MS, RD, LDN Pager #- [phone] Office#- [phone] After Hours Pager: [phone]

## 2018-10-24 NOTE — Consult Note (Signed)
ELECTROPHYSIOLOGY CONSULT NOTE  Patient ID: Diane Mullins, MRN: [id number], DOB/AGE: Aug 06, 1933 85 y.o. Admit date: 10/17/2018 Date of Consult: 10/24/2018  Primary Physician: [name], MD Primary Cardiologist:TG RHYLIE STEHR is a 85 y.o. female who is being seen today for the evaluation of Aflutter at the request of CE.   Chief Complaint: afluitter  HPI Diane Mullins is a 85 y.o. female admitted to the hospital with weakness and dizziness and retrospectively aware of palpitations. Found to be in atrial flutter-typical with 2: 1 conduction.  Because of difficult to control rate shortness of breath she underwent TEE cardioversion with prior anticoagulation with apixaban  Also concern for slurred speech which was evident prior to admission and has been present in the more remote past.  It is clear.  MRI scan was negative for acute intracranial abnormality.  Looking back think she may have had similar palpitations in the past but no documentation of atrial flutter or atrial fibrillation.  Carries a diagnosis of a TIA.  No bleeding issues.    Still very short of breath.  Denies a history of asthma.  DATE TEST EF   1/17 Echo   60-65 %   3/19 Myoview 54% Low Risk  10/19 Echo   60-65 % LA (31 cc/m2)  2/20 TEE  MR mild mod ecc      Thromboembolic risk factors ( age -24, HTN-1, TIA/CVA-2, DM-1, Gender-1) for a CHADSVASc Score of >=7   With recurrent anemia  Past Medical History:  Diagnosis Date  . Benign paroxysmal positional vertigo 07/17/2014  . Chronic diastolic CHF (congestive heart failure) ([place])    a. echo 10/19: EF of 60-65%, no RWMA, Gr2DD, calcified mitral annulus with mild MR, mildly to moderately dilated left atrium, moderate TR, PASP 52 mmHg  . Diabetes mellitus   . H/O: rheumatic fever   . Hyperlipidemia   . Hypertension   . Pulmonary hypertension ([place])   . Stroke [facility])    TIA Jan. 1st  . Teratoma of left ovary   . Thyroid disease       Surgical  History:  Past Surgical History:  Procedure Laterality Date  . ABDOMINAL HYSTERECTOMY  1973   menorrhagia  . CARDIOVERSION N/A 10/22/2018   Procedure: CARDIOVERSION;  Surgeon: Nelva Bush, MD;  Location: [facility] ORS;  Service: Cardiovascular;  Laterality: N/A;  . Anon [place]  . ORIF ANKLE FRACTURE Right 10/29/2015   Procedure: OPEN REDUCTION INTERNAL FIXATION (ORIF) ANKLE FRACTURE;  Surgeon: Thornton Park, MD;  Location: [facility] ORS;  Service: Orthopedics;  Laterality: Right;  . TEE WITHOUT CARDIOVERSION N/A 10/22/2018   Procedure: TRANSESOPHAGEAL ECHOCARDIOGRAM (TEE);  Surgeon: Nelva Bush, MD;  Location: [facility] ORS;  Service: Cardiovascular;  Laterality: N/A;  . VAGINAL DELIVERY     x4     Home Meds: Prior to Admission medications   Medication Sig Start Date End Date Taking? Authorizing Provider  aspirin EC 81 MG tablet Take 81 mg by mouth daily.   Yes [provider]  ferrous sulfate 325 (65 FE) MG EC tablet Take 1 tablet (325 mg total) by mouth daily with breakfast. 08/30/18  Yes Guse, Jacquelynn Cree, FNP  furosemide (LASIX) 20 MG tablet Take 1 tablet (20 mg total) by mouth daily. 07/10/18  Yes Gladstone Lighter, MD  glucose blood (ONE TOUCH ULTRA TEST) test strip Use as instructed to check Blood sugar three times a day.  E11.65 Dispense patient choice. 07/30/18  Yes Guse, Jacquelynn Cree, FNP  ibuprofen (ADVIL,MOTRIN) 200 MG tablet Take 200 mg by mouth every 6 (six) hours as needed.   Yes [provider]  Insulin Glargine (LANTUS SOLOSTAR) 100 UNIT/ML Solostar Pen INJECT  17 UNITS SUBCUTANEOUSLY IN THE MORNING 08/27/18  Yes Guse, Jacquelynn Cree, FNP  Insulin Pen Needle (PEN NEEDLES) 32G X 4 MM MISC Inject 1 Dose into the skin daily. 12/19/16  Yes [name], MD  levothyroxine (SYNTHROID, LEVOTHROID) 75 MCG tablet Take 1 tablet (75 mcg total) by mouth daily. 04/09/18  Yes [name], MD  losartan (COZAAR) 50 MG tablet Take 50 mg by mouth daily.   Yes [provider]  metFORMIN (GLUCOPHAGE) 1000 MG tablet Take 1 tablet (1,000 mg total) by mouth 2 (two) times daily with a meal. TAKE 1 TABLET TWICE DAILY  WITH  A  MEAL 04/09/18  Yes [name], MD  metoprolol succinate (TOPROL-XL) 25 MG 24 hr tablet Take 1 tablet (25 mg total) by mouth daily. 08/27/18  Yes Guse, Jacquelynn Cree, FNP  omeprazole (PRILOSEC) 20 MG capsule Take 1 capsule (20 mg total) by mouth daily. 04/09/18  Yes [name], MD  polyethylene glycol [facility] / GLYCOLAX) packet Take 17 g by mouth daily as needed.    Yes [provider]  potassium chloride (K-DUR) 10 MEQ tablet TAKE 1 TABLET BY MOUTH EVERY DAY 09/12/18  Yes [name], MD  simvastatin (ZOCOR) 40 MG tablet Take 1 tablet (40 mg total) by mouth every evening. 04/09/18  Yes [name], MD    Inpatient Medications:  . apixaban  5 mg Oral BID  . ferrous sulfate  325 mg Oral Q breakfast  . furosemide  40 mg Oral BID  . insulin aspart  0-5 Units Subcutaneous QHS  . insulin aspart  0-9 Units Subcutaneous TID WC  . insulin glargine  17 Units Subcutaneous Daily  . levothyroxine  75 mcg Oral QAC breakfast  . metFORMIN  1,000 mg Oral BID WC  . metoCLOPramide (REGLAN) injection  10 mg Intravenous Q6H  . multivitamin with minerals  1 tablet Oral Daily  . pantoprazole  40 mg Oral Daily  . potassium chloride  10 mEq Oral Daily  . simvastatin  40 mg Oral QPM  . sodium chloride flush  10 mL Intravenous Q12H  . sodium chloride flush  3 mL Intravenous Q12H  . vitamin B-12  1,000 mcg Oral Daily     Allergies:  Allergies  Allergen Reactions  . Oxycodone     Other reaction(s): Confusion Patient's daughter reported confusion/sensitivity to oxycodone.     Social History   Socioeconomic History  . Marital status: Widowed    Spouse name: Not on file  . Number of children: 4  . Years of education: Not on file  . Highest education level: Not on file  Occupational History  . Not on file    Social Needs  . Financial resource strain: Not hard at all  . Food insecurity:    Worry: Never true    Inability: Never true  . Transportation needs:    Medical: No    Non-medical: No  Tobacco Use  . Smoking status: Never Smoker  . Smokeless tobacco: Never Used  Substance and Sexual Activity  . Alcohol use: No  . Drug use: No  . Sexual activity: Never  Lifestyle  . Physical activity:    Days per week: 3 days    Minutes per session: 50 min  . Stress: Not  at all  Relationships  . Social connections:    Talks on phone: Not on file    Gets together: Not on file    Attends religious service: Not on file    Active member of club or organization: Not on file    Attends meetings of clubs or organizations: Not on file    Relationship status: Not on file  . Intimate partner violence:    Fear of current or ex partner: Not on file    Emotionally abused: Not on file    Physically abused: Not on file    Forced sexual activity: Not on file  Other Topics Concern  . Not on file  Social History Narrative   Lives in [place] alone. No pets. Retired from [employer].   Diet: healthy   Exercise: water aerobics twice weekly, balance class   Ambulates with a walker at baseline     Family History  Problem Relation Age of Onset  . Heart disease Mother 67  . Heart attack Mother 65  . Cancer Father        colon  . Diabetes Sister   . Hypertension Sister   . Cancer Brother        kidney  . Diabetes Brother   . Cancer Daughter        ovarian  . Non-Hodgkin's lymphoma Son      ROS:  Please see the history of present illness.     All other systems reviewed and negative.    Physical Exam:  Blood pressure 139/63, pulse 97, temperature 98.7 F (37.1 C), resp. rate 18, height 5\' 1"  (1.549 m), weight 95.5 kg, SpO2 97 %. General: Well developed, well nourished female in modest respiratory distress Head: Normocephalic, atraumatic, sclera non-icteric, no xanthomas, nares are without  discharge. EENT: normal Lymph Nodes:  none Back: Mild kyphosis , no CVA tendersness Neck: Negative for carotid bruits. JVD 8 cm Lungs: Diffuse wheezing with deep inspiration triggering coughing. I: E--1: 3 Heart: RRR with S1 S2.  15/4 systolic murmur , rubs, or gallops appreciated. Abdomen: Soft, non-tender, non-distended with normoactive bowel sounds. No hepatomegaly. No rebound/guarding. No obvious abdominal masses. Msk:  Strength and tone appear normal for age. Extremities: No clubbing or cyanosis. 1+ edema.  Distal pedal pulses are 2+ and equal bilaterally. Skin: Warm and Dry Neuro: Alert and oriented X 3. CN III-XII intact Grossly normal sensory and motor function . Psych:  Responds to questions appropriately with a normal affect.      Labs: Cardiac Enzymes No results for input(s): CKTOTAL, CKMB, TROPONINI in the last 72 hours. CBC Lab Results  Component Value Date   WBC 10.7 (H) 10/20/2018   HGB 12.6 10/20/2018   HCT 40.8 10/20/2018   MCV 85.9 10/20/2018   PLT 238 10/20/2018   PROTIME: No results for input(s): LABPROT, INR in the last 72 hours. Chemistry  Recent Labs  Lab 10/24/18 0523  NA 134*  K 3.9  CL 93*  CO2 32  BUN 24*  CREATININE 1.03*  CALCIUM 8.6*  GLUCOSE 138*   Lipids Lab Results  Component Value Date   CHOL 130 07/06/2018   HDL 35 (L) 07/06/2018   LDLCALC 74 07/06/2018   TRIG 107 07/06/2018   BNP No results found for: PROBNP Thyroid Function Tests: No results for input(s): TSH, T4TOTAL, T3FREE, THYROIDAB in the last 72 hours.  Invalid input(s): FREET3    Miscellaneous No results found for: DDIMER  Radiology/Studies:  Mr Brain Wo Contrast  Result Date: 10/22/2018 CLINICAL DATA:  Motor neuron disease. EXAM: MRI HEAD WITHOUT CONTRAST TECHNIQUE: Multiplanar, multiecho pulse sequences of the brain and surrounding structures were obtained without intravenous contrast. COMPARISON:  CT head 03/30/2016, MRI head 09/20/2015 FINDINGS: Brain:  Image quality degraded by motion. Mild atrophy similar to the prior study. Moderate chronic white matter changes with mild progression. Negative for acute infarct.  Negative for hemorrhage or mass. Vascular: Normal arterial flow voids Skull and upper cervical spine: Negative Sinuses/Orbits: Negative Other: None IMPRESSION: No acute intracranial abnormality. Atrophy and moderate chronic microvascular ischemic change in the white matter. Electronically Signed   By: Franchot Gallo M.D.   On: 10/22/2018 15:33   Dg Chest Port 1 View  Result Date: 10/17/2018 CLINICAL DATA:  Dizziness for 1 week, tachycardia, history diabetes mellitus, CHF EXAM: PORTABLE CHEST 1 VIEW COMPARISON:  Portable exam 1536 hours compared to 07/09/2018 Correlation: CT chest 07/05/2018 FINDINGS: Enlargement of cardiac silhouette. Inferior mediastinal density consistent with hiatal hernia as identified on a prior CT. Mediastinal contours and pulmonary vascularity otherwise normal. Lungs clear. No pleural effusion or pneumothorax. Bones demineralized. IMPRESSION: Enlargement of cardiac silhouette. Hiatal hernia. No acute abnormalities. Electronically Signed   By: Lavonia Dana M.D.   On: 10/17/2018 16:03   US Pelvic Complete With Transvaginal  Result Date: 09/25/2018 CLINICAL DATA:  Teratoma of LEFT ovary EXAM: TRANSABDOMINAL AND TRANSVAGINAL ULTRASOUND OF PELVIS TECHNIQUE: Both transabdominal and transvaginal ultrasound examinations of the pelvis were performed. Transabdominal technique was performed for global imaging of the pelvis including uterus, ovaries, adnexal regions, and pelvic cul-de-sac. It was necessary to proceed with endovaginal exam following the transabdominal exam to visualize the ovaries. COMPARISON:  CT abdomen and pelvis 07/05/2018 FINDINGS: Uterus Surgically absent Endometrium N/A Right ovary Not visualized question due to surgical absence or obscuration by bowel Left ovary No normal LEFT ovarian tissue identified, see below  Other findings A heterogeneous isoechoic to slightly hyperechoic mass is identified in the LEFT adnexa measuring 6.8 x 7.3 x 5.8 cm in size. 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 No free pelvic fluid. No additional adnexal or pelvic masses. IMPRESSION: Surgical absence of uterus with nonvisualization of RIGHT ovary. Heterogeneous isoechoic to mildly hyperechoic LEFT adnexal mass 6.8 x 7.3 x 5.8 cm in size consistent with the LEFT ovarian dermoid tumor seen on the prior CT exam. Electronically Signed   By: Lavonia Dana M.D.   On: 09/25/2018 14:35    EKG: Sinus rhythm ECG from presentation 1/30 personally reviewed demonstrated atrial flutter-typical  Assessment and Plan:  Atrial flutter s/p DCCV  Tachypalpitations  Prior stroke  Anemia  Dyspnea with diffuse wheezing consider reactive airways disease/COPD   The patient has an isolated episode of documented atrial flutter.  Given that long-term anticoagulation is indicated not withstanding rhythm management given her prior TIA, catheter ablation is not currently indicated.  Her prior history of palpitations suggests the atrial flutter may recur or be associated with atrial fibrillation.  In the event that she has recurrent tachypalpitations she is advised to return for evaluation with electrocardiogram  In the interim, I would not rate control.  She is at exceedingly high risk for thromboembolism so maintaining her anticoagulation is appropriate and her dose is appropriate.  We all have some trepidation given her history of anemia.  Her thromboembolic risk however is so high that I would treat her anemia repeatedly prior to thinking about discontinuing her anticoagulation.  Her pulmonary status is compromising.  Have discussed this with the hospitalist.  We will sign off.  Call if we can be of further assistance.    Virl Axe

## 2018-10-24 NOTE — Progress Notes (Signed)
Got patient up to the chair for a short while.  It was very difficult getting her back to bed.

## 2018-10-24 NOTE — Progress Notes (Addendum)
[facility] [place] NAME: Diane Mullins    MR#:  [id number]  DATE OF BIRTH:  1932-09-23  SUBJECTIVE:  Patient and patient's family complaining of nonproductive cough, chest congestion, chest x-ray noted for heart failure-patient restarted on IV Lasix   REVIEW OF SYSTEMS:    ROS  CONSTITUTIONAL: No documented fever. No fatigue, weakness. No weight gain, no weight loss.  EYES: No blurry or double vision.  ENT: No tinnitus. No postnasal drip. No redness of the oropharynx.  RESPIRATORY: No cough, no wheeze, no hemoptysis. No dyspnea.  CARDIOVASCULAR: No chest pain. No orthopnea. Had palpitations. No syncope.  GASTROINTESTINAL: No nausea, no vomiting or diarrhea. No abdominal pain. No melena or hematochezia.  GENITOURINARY: No dysuria or hematuria.  ENDOCRINE: No polyuria or nocturia. No heat or cold intolerance.  HEMATOLOGY: No anemia. No bruising. No bleeding.  INTEGUMENTARY: No rashes. No lesions.  MUSCULOSKELETAL: No arthritis. No swelling. No gout.  NEUROLOGIC: No numbness, tingling, or ataxia. No seizure-type activity.  PSYCHIATRIC: No anxiety. No insomnia. No ADD.   DRUG ALLERGIES:   Allergies  Allergen Reactions  . Oxycodone     Other reaction(s): Confusion Patient's daughter reported confusion/sensitivity to oxycodone.     VITALS:  Blood pressure 139/63, pulse 97, temperature 98.7 F (37.1 C), resp. rate 18, height 5\' 1"  (1.549 m), weight 95.5 kg, SpO2 97 %.  PHYSICAL EXAMINATION:   Physical Exam  GENERAL:  85 y.o.-year-old patient lying in the bed with no acute distress.  EYES: Pupils equal, round, reactive to light and accommodation. No scleral icterus. Extraocular muscles intact.  HEENT: Head atraumatic, normocephalic. Oropharynx and nasopharynx clear.  NECK:  Supple, no jugular venous distention. No thyroid enlargement, no tenderness.  LUNGS: Normal breath sounds bilaterally, no wheezing, rales, rhonchi. No use of  accessory muscles of respiration.  CARDIOVASCULAR: S1, S2 irregular. No murmurs, rubs, or gallops.  ABDOMEN: Soft, nontender, nondistended. Bowel sounds present. No organomegaly or mass.  EXTREMITIES: No cyanosis, clubbing or edema b/l.    NEUROLOGIC: Cranial nerves II through XII are intact. No focal Motor or sensory deficits b/l.   PSYCHIATRIC: The patient is alert and oriented x 3.  SKIN: No obvious rash, lesion, or ulcer.   LABORATORY PANEL:   CBC Recent Labs  Lab 10/20/18 0414  WBC 10.7*  HGB 12.6  HCT 40.8  PLT 238   ------------------------------------------------------------------------------------------------------------------ Chemistries  Recent Labs  Lab 10/22/18 0614 10/24/18 0523  NA 134* 134*  K 4.4 3.9  CL 98 93*  CO2 30 32  GLUCOSE 171* 138*  BUN 25* 24*  CREATININE 1.17* 1.03*  CALCIUM 8.6* 8.6*  MG 1.5*  --    ------------------------------------------------------------------------------------------------------------------  Cardiac Enzymes Recent Labs  Lab 10/18/18 0442  TROPONINI 0.05*   ------------------------------------------------------------------------------------------------------------------  RADIOLOGY:  Dg Chest 2 View  Result Date: 10/24/2018 CLINICAL DATA:  Worsening shortness of breath. Congestive heart failure. EXAM: CHEST - 2 VIEW COMPARISON:  10/17/2018 FINDINGS: Cardiomegaly. Pulmonary venous hypertension with mild interstitial edema and small effusions. No consolidation or lobar collapse. No acute bone finding. IMPRESSION: Congestive heart failure with cardiomegaly, pulmonary venous hypertension, mild interstitial edema and small effusions. Electronically Signed   By: Nelson Chimes M.D.   On: 10/24/2018 11:13   Mr Brain Wo Contrast  Result Date: 10/22/2018 CLINICAL DATA:  Motor neuron disease. EXAM: MRI HEAD WITHOUT CONTRAST TECHNIQUE: Multiplanar, multiecho pulse sequences of the brain and surrounding structures were obtained  without intravenous contrast. COMPARISON:  CT head 03/30/2016,  MRI head 09/20/2015 FINDINGS: Brain: Image quality degraded by motion. Mild atrophy similar to the prior study. Moderate chronic white matter changes with mild progression. Negative for acute infarct.  Negative for hemorrhage or mass. Vascular: Normal arterial flow voids Skull and upper cervical spine: Negative Sinuses/Orbits: Negative Other: None IMPRESSION: No acute intracranial abnormality. Atrophy and moderate chronic microvascular ischemic change in the white matter. Electronically Signed   By: Franchot Gallo M.D.   On: 10/22/2018 15:33     ASSESSMENT AND PLAN:  85 year old elderly female patient with history of diabetes mellitus type 2, hypertension, TIA in the past, pulmonary hypertension, diastolic heart failure currently under hospitalist service for palpitations and dizziness  *Acute atrial flutter with RVR, new onset Resolved-s/p cardioversion October 22, 2018 by Dr. Saunders Revel Per cardiology-continue to hold AV nodal blocking agents given bradycardia after cardioversion, continue Eliquis, physical therapy recommending skilled nursing facility placement   *Acute on chronic diastolic heart failure exacerbation   Secondary to above  Continue CHF protocol, change Lasix to 60 mg IV twice daily, strict I&O monitoring, daily weights, Coreg discontinued given bradycardia s/p cardioversion  *Chronic diabetes mellitus type 2  Stable on current regiment   *Acute UTI  Resolved Treated with course of Rocephin, urine culture noted for contaminant   *Chronic B12 deficiency Stable Continue vitamin B12  *Acute ? Slurred speech Resolved Since admission per family, during my time with patient-I never noticed any slurred speech or neurological deficits, patient does not complain of any focal motor weakness or paresthesias MRI brain negative for any acute process, neurochecks per routine, on Eliquis,  statin therapy, speech therapy did see  patient while in house-no changes recommended   Disposition to independent living facility in 1 to 2 days barring any complications   all the records are reviewed and case discussed with Care Management/Social Worker. Management plans discussed with the patient, family and they are in agreement.  CODE STATUS: Full code  DVT Prophylaxis: SCDs  TOTAL TIME TAKING CARE OF THIS PATIENT: 35 minutes.   POSSIBLE D/C IN 1-2 DAYS, DEPENDING ON CLINICAL CONDITION.  Avel Peace  M.D on 10/24/2018 at 12:03 PM  Between 7am to 6pm - Pager - [phone]  After 6pm go to [email or web address] - password [email or web address] [facility]  Office  [phone]  CC: Primary care physician; [name], MD  Note: This dictation was prepared with Dragon dictation along with smaller phrase technology. Any transcriptional errors that result from this process are unintentional.

## 2018-10-25 ENCOUNTER — Ambulatory Visit: Payer: PPO | Admitting: Cardiovascular Disease

## 2018-10-25 LAB — GLUCOSE, CAPILLARY
Glucose-Capillary: 164 mg/dL — ABNORMAL HIGH (ref 70–99)
Glucose-Capillary: 266 mg/dL — ABNORMAL HIGH (ref 70–99)
Glucose-Capillary: 258 mg/dL — ABNORMAL HIGH (ref 70–99)
Glucose-Capillary: 235 mg/dL — ABNORMAL HIGH (ref 70–99)

## 2018-10-25 LAB — MAGNESIUM: Magnesium: 1.7 mg/dL (ref 1.7–2.4)

## 2018-10-25 MED ORDER — MAGNESIUM SULFATE IN D5W 1-5 GM/100ML-% IV SOLN
1.0000 g | Freq: Once | INTRAVENOUS | Status: AC
  Administered 2018-10-25: 1 g via INTRAVENOUS
  Filled 2018-10-25: qty 100

## 2018-10-25 NOTE — Progress Notes (Signed)
Physical Therapy Treatment Patient Details Name: Diane Mullins MRN: [id number] DOB: 1932/11/06 Today's Date: 10/25/2018    History of Present Illness presented to ER secondary to dizziness, weakness; admitted for management of A-flutter.  Mild elevation in troponin (0.05) noted; currently on cardizem and heparin drip for medical management.  Status post cardioversion 10/21/2018    PT Comments    Improved alertness and overall participation this date.  Able to complete basic transfers, OOB to chair and short-distance gait (10') with RW, min/mod assist.  Min cuing for walker position, management and overall safety.   Demonstrates broad BOS, short choppy steps with flat foot contact; increased sway, all planes; poor balance/righting reactions. Unsafe to attempt without RW and +1 at all times. Of note, HR stable and WFL throughout session (80s-90s) and responds appropriately to activity; sats >92% on 2L supplemental O2 at rest and with activity.  Unable to demonstrate ability to safely/indep care for self, negotiate household distances necessary to facilitate safe discharge to home environment; continue to recommend STR at discharge.      Follow Up Recommendations  SNF     Equipment Recommendations       Recommendations for Other Services       Precautions / Restrictions Precautions Precautions: Fall Restrictions Weight Bearing Restrictions: No    Mobility  Bed Mobility Overal bed mobility: Needs Assistance Bed Mobility: Supine to Sit     Supine to sit: Min assist     General bed mobility comments: assist for truncal elevation  Transfers Overall transfer level: Needs assistance Equipment used: Rolling walker (2 wheeled) Transfers: Sit to/from Stand Sit to Stand: Min assist         General transfer comment: cuing for hand placement (tends to pull on RW), assist for lift off, anterior weight translation and overall standing balance.  Increased sway, all planes; poor  balance/righting reactions  Ambulation/Gait Ambulation/Gait assistance: Min assist Gait Distance (Feet): 10 Feet Assistive device: Rolling walker (2 wheeled)       General Gait Details: broad BOS, short choppy steps with flat foot contact; increased sway, all planes; poor balance/righting reactions. Unsafe to attempt without RW and +1 at all times.   Stairs             Wheelchair Mobility    Modified Rankin (Stroke Patients Only)       Balance Overall balance assessment: Needs assistance Sitting-balance support: No upper extremity supported;Feet supported Sitting balance-Leahy Scale: Good     Standing balance support: Bilateral upper extremity supported Standing balance-Leahy Scale: Fair                              Cognition Arousal/Alertness: Awake/alert Behavior During Therapy: WFL for tasks assessed/performed Overall Cognitive Status: Within Functional Limits for tasks assessed                                        Exercises Other Exercises Other Exercises: Toilet transfer, SPT from bed/BSC, with RW, min/mod assist; sit/stand from [facility] with RW, min/mod assist; standing balance with RW, min/mod assist. Other Exercises: Bed/chair with RW, min assist. Min cuing for walker position/management.    General Comments        Pertinent Vitals/Pain      Home Living  Prior Function            PT Goals (current goals can now be found in the care plan section) Acute Rehab PT Goals Patient Stated Goal: to feel better PT Goal Formulation: With patient Time For Goal Achievement: 11/01/18 Potential to Achieve Goals: Good Progress towards PT goals: Progressing toward goals    Frequency    Min 2X/week      PT Plan Current plan remains appropriate    Co-evaluation              AM-PAC PT "6 Clicks" Mobility   Outcome Measure  Help needed turning from your back to your side while in a flat  bed without using bedrails?: A Little Help needed moving from lying on your back to sitting on the side of a flat bed without using bedrails?: A Little Help needed moving to and from a bed to a chair (including a wheelchair)?: A Little Help needed standing up from a chair using your arms (e.g., wheelchair or bedside chair)?: A Little Help needed to walk in hospital room?: A Little Help needed climbing 3-5 steps with a railing? : A Lot 6 Click Score: 17    End of Session Equipment Utilized During Treatment: Gait belt;Oxygen Activity Tolerance: Patient tolerated treatment well Patient left: in chair;with call bell/phone within reach;with chair alarm set Nurse Communication: Mobility status PT Visit Diagnosis: Muscle weakness (generalized) (M62.81);Difficulty in walking, not elsewhere classified (R26.2)     Time: 9753-0051 PT Time Calculation (min) (ACUTE ONLY): 23 min  Charges:  $Gait Training: 8-22 mins $Therapeutic Activity: 8-22 mins                     Izaha Shughart H. Owens Shark, PT, DPT, NCS 10/25/18, 11:51 AM [phone]

## 2018-10-25 NOTE — Clinical Social Work Note (Signed)
CSW contacted patient's insurance company to find out if when they would like to be notified to [employer] authorization.  Patient's insurance company stated to contact the [employer] on call phone number [phone] on day physician feels patient is ready for discharge.  CSW informed insurance company, that patient may be ready over the weekend and she said someone will be available to work on insurance authorization if she is ready for discharge.  CSW will updated weekend social worker to check with attending physician.  CSW updated [facility] which is where patient will be discharging to, once she is medically ready for discharge.  [name]. Norval Morton, MSW, [place]  10/25/2018 6:33 PM

## 2018-10-25 NOTE — Consult Note (Signed)
ANTICOAGULATION CONSULT NOTE - Initial Consult  Pharmacy Consult for Apixaban Indication: atrial fibrillation  Allergies  Allergen Reactions  . Oxycodone     Other reaction(s): Confusion Patient's daughter reported confusion/sensitivity to oxycodone.     Patient Measurements: Height: 5\' 1"  (154.9 cm) Weight: 208 lb 15.9 oz (94.8 kg) IBW/kg (Calculated) : 47.8   Vital Signs: Temp: 97.7 F (36.5 C) (02/07 0821) Temp Source: Oral (02/07 0821) BP: 130/61 (02/07 0821) Pulse Rate: 84 (02/07 0821)  Labs: Recent Labs    10/24/18 0523 10/24/18 1323  HGB  --  12.0  HCT  --  37.6  PLT  --  239  CREATININE 1.03* 1.13*    Estimated Creatinine Clearance: 39 mL/min (A) (by C-G formula based on SCr of 1.13 mg/dL (H)).   Medical History: Past Medical History:  Diagnosis Date  . Benign paroxysmal positional vertigo 07/17/2014  . Chronic diastolic CHF (congestive heart failure) ([place])    a. echo 10/19: EF of 60-65%, no RWMA, Gr2DD, calcified mitral annulus with mild MR, mildly to moderately dilated left atrium, moderate TR, PASP 52 mmHg  . Diabetes mellitus   . H/O: rheumatic fever   . Hyperlipidemia   . Hypertension   . Pulmonary hypertension ([place])   . Stroke [facility])    TIA Jan. 1st  . Teratoma of left ovary   . Thyroid disease     Medications:  Scheduled:  . apixaban  5 mg Oral BID  . ferrous sulfate  325 mg Oral Q breakfast  . furosemide  60 mg Intravenous BID  . insulin aspart  0-5 Units Subcutaneous QHS  . insulin aspart  0-9 Units Subcutaneous TID WC  . insulin glargine  17 Units Subcutaneous Daily  . ipratropium  0.5 mg Nebulization TID  . levalbuterol  0.63 mg Nebulization TID  . levothyroxine  75 mcg Oral QAC breakfast  . metFORMIN  1,000 mg Oral BID WC  . metoCLOPramide (REGLAN) injection  10 mg Intravenous Q6H  . multivitamin with minerals  1 tablet Oral Daily  . pantoprazole  40 mg Oral Daily  . potassium chloride  10 mEq Oral Daily  . simvastatin  40 mg  Oral QPM  . sodium chloride flush  10 mL Intravenous Q12H  . sodium chloride flush  3 mL Intravenous Q12H  . vitamin B-12  1,000 mcg Oral Daily    Assessment: 85 yo female with new afib. Was not on any PTA anticoagulants. Is transitioning from heparin to apixaban.   Goal of Therapy:  Monitor platelets by anticoagulation protocol: Yes   Plan:  Continue Apixaban 5 mg PO BID  Forrest Moron, PharmD Clinical Pharmacist 10/25/2018 12:28 PM

## 2018-10-25 NOTE — Progress Notes (Signed)
[place] at [place] NAME: Diane Mullins    MR#:  [id number]  DATE OF BIRTH:  08/19/1933  SUBJECTIVE:  Patient complaining of mild chest congestion/cough-better compared to yesterday, numerous family members at the bedside, patient denies chest pain/shortness of breath, work-up yesterday noted for recurrent heart failure exacerbation-Lasix subsequently increased and made IV, cardiology input appreciated  REVIEW OF SYSTEMS:    ROS  CONSTITUTIONAL: No documented fever. No fatigue, weakness. No weight gain, no weight loss.  EYES: No blurry or double vision.  ENT: No tinnitus. No postnasal drip. No redness of the oropharynx.  RESPIRATORY: No cough, no wheeze, no hemoptysis. No dyspnea.  CARDIOVASCULAR: No chest pain. No orthopnea. Had palpitations. No syncope.  GASTROINTESTINAL: No nausea, no vomiting or diarrhea. No abdominal pain. No melena or hematochezia.  GENITOURINARY: No dysuria or hematuria.  ENDOCRINE: No polyuria or nocturia. No heat or cold intolerance.  HEMATOLOGY: No anemia. No bruising. No bleeding.  INTEGUMENTARY: No rashes. No lesions.  MUSCULOSKELETAL: No arthritis. No swelling. No gout.  NEUROLOGIC: No numbness, tingling, or ataxia. No seizure-type activity.  PSYCHIATRIC: No anxiety. No insomnia. No ADD.   DRUG ALLERGIES:   Allergies  Allergen Reactions  . Oxycodone     Other reaction(s): Confusion Patient's daughter reported confusion/sensitivity to oxycodone.     VITALS:  Blood pressure 130/61, pulse 84, temperature 97.7 F (36.5 C), temperature source Oral, resp. rate 17, height 5\' 1"  (1.549 m), weight 94.8 kg, SpO2 98 %.  PHYSICAL EXAMINATION:   Physical Exam  GENERAL:  85 y.o.-year-old patient lying in the bed with no acute distress.  EYES: Pupils equal, round, reactive to light and accommodation. No scleral icterus. Extraocular muscles intact.  HEENT: Head atraumatic, normocephalic. Oropharynx and nasopharynx  clear.  NECK:  Supple, no jugular venous distention. No thyroid enlargement, no tenderness.  LUNGS: Normal breath sounds bilaterally, no wheezing, rales, rhonchi. No use of accessory muscles of respiration.  CARDIOVASCULAR: S1, S2 irregular. No murmurs, rubs, or gallops.  ABDOMEN: Soft, nontender, nondistended. Bowel sounds present. No organomegaly or mass.  EXTREMITIES: No cyanosis, clubbing or edema b/l.    NEUROLOGIC: Cranial nerves II through XII are intact. No focal Motor or sensory deficits b/l.   PSYCHIATRIC: The patient is alert and oriented x 3.  SKIN: No obvious rash, lesion, or ulcer.   LABORATORY PANEL:   CBC Recent Labs  Lab 10/24/18 1323  WBC 7.0  HGB 12.0  HCT 37.6  PLT 239   ------------------------------------------------------------------------------------------------------------------ Chemistries  Recent Labs  Lab 10/24/18 0523 10/24/18 1323 10/25/18 0650  NA 134*  --   --   K 3.9  --   --   CL 93*  --   --   CO2 32  --   --   GLUCOSE 138*  --   --   BUN 24*  --   --   CREATININE 1.03* 1.13*  --   CALCIUM 8.6*  --   --   MG  --   --  1.7   ------------------------------------------------------------------------------------------------------------------  Cardiac Enzymes No results for input(s): TROPONINI in the last 168 hours. ------------------------------------------------------------------------------------------------------------------  RADIOLOGY:  Dg Chest 2 View  Result Date: 10/24/2018 CLINICAL DATA:  Worsening shortness of breath. Congestive heart failure. EXAM: CHEST - 2 VIEW COMPARISON:  10/17/2018 FINDINGS: Cardiomegaly. Pulmonary venous hypertension with mild interstitial edema and small effusions. No consolidation or lobar collapse. No acute bone finding. IMPRESSION: Congestive heart failure with cardiomegaly, pulmonary venous hypertension, mild interstitial  edema and small effusions. Electronically Signed   By: Nelson Chimes M.D.   On:  10/24/2018 11:13     ASSESSMENT AND PLAN:  85 year old elderly female patient with history of diabetes mellitus type 2, hypertension, TIA in the past, pulmonary hypertension, diastolic heart failure currently under hospitalist service for palpitations and dizziness  *Acute atrial flutter with RVR, new onset Resolved-s/p cardioversion October 22, 2018 by Dr. Saunders Revel Per cardiology-continue to hold AV nodal blocking agents given bradycardia after cardioversion, continue Eliquis, physical therapy recommending skilled nursing facility placement   *Acute on chronic diastolic heart failure exacerbation   Markedly improved Secondary to above  Continue CHF protocol, Lasix to 60 mg IV twice daily for 1 more day, strict I&O monitoring, daily weights, Coreg discontinued given bradycardia s/p cardioversion, continue close medical monitoring  *Chronic diabetes mellitus type 2  Stable on current regiment   *Acute UTI  Resolved Treated with course of Rocephin, urine culture noted for contaminant   *Chronic B12 deficiency Stable Continue vitamin B12  *Acute ? Slurred speech Resolved Since admission per family, during my time with patient-I never noticed any slurred speech or neurological deficits, patient does not complain of any focal motor weakness or paresthesias MRI brain negative for any acute process, neurochecks per routine, on Eliquis,  statin therapy, speech therapy did see patient while in house-no changes recommended   Disposition to SNF on tomorrow barring any complications   all the records are reviewed and case discussed with Care Management/Social Worker. Management plans discussed with the patient, family and they are in agreement.  CODE STATUS: Full code  DVT Prophylaxis: SCDs  TOTAL TIME TAKING CARE OF THIS PATIENT: 35 minutes.   POSSIBLE D/C IN 1-2 DAYS, DEPENDING ON CLINICAL CONDITION.  Avel Peace Salary M.D on 10/25/2018 at 2:05 PM  Between 7am to 6pm - Pager -  [phone]  After 6pm go to [email or web address] - password [email or web address]  Office  [phone]  CC: Primary care physician; [name], MD  Note: This dictation was prepared with Dragon dictation along with smaller phrase technology. Any transcriptional errors that result from this process are unintentional.

## 2018-10-26 ENCOUNTER — Inpatient Hospital Stay: Payer: PPO

## 2018-10-26 LAB — BASIC METABOLIC PANEL
Anion gap: 8 (ref 5–15)
BUN: 27 mg/dL — ABNORMAL HIGH (ref 8–23)
CO2: 39 mmol/L — ABNORMAL HIGH (ref 22–32)
Calcium: 8.8 mg/dL — ABNORMAL LOW (ref 8.9–10.3)
Chloride: 89 mmol/L — ABNORMAL LOW (ref 98–111)
Creatinine, Ser: 1.05 mg/dL — ABNORMAL HIGH (ref 0.44–1.00)
GFR calc non Af Amer: 49 mL/min — ABNORMAL LOW (ref >60)
GFR, EST AFRICAN AMERICAN: 56 mL/min — AB (ref >60)
Glucose, Bld: 160 mg/dL — ABNORMAL HIGH (ref 70–99)
Potassium: 3.1 mmol/L — ABNORMAL LOW (ref 3.5–5.1)
Sodium: 136 mmol/L (ref 135–145)

## 2018-10-26 LAB — GLUCOSE, CAPILLARY
GLUCOSE-CAPILLARY: 213 mg/dL — AB (ref 70–99)
Glucose-Capillary: 210 mg/dL — ABNORMAL HIGH (ref 70–99)
Glucose-Capillary: 368 mg/dL — ABNORMAL HIGH (ref 70–99)
Glucose-Capillary: 350 mg/dL — ABNORMAL HIGH (ref 70–99)

## 2018-10-26 MED ORDER — FUROSEMIDE 10 MG/ML IJ SOLN
60.0000 mg | Freq: Every day | INTRAMUSCULAR | Status: DC
  Administered 2018-10-27 – 2018-10-28 (×2): 60 mg via INTRAVENOUS
  Filled 2018-10-26 (×2): qty 6

## 2018-10-26 MED ORDER — METHYLPREDNISOLONE SODIUM SUCC 40 MG IJ SOLR
40.0000 mg | Freq: Once | INTRAMUSCULAR | Status: AC
  Administered 2018-10-26: 40 mg via INTRAVENOUS
  Filled 2018-10-26: qty 1

## 2018-10-26 MED ORDER — POTASSIUM CHLORIDE CRYS ER 20 MEQ PO TBCR
40.0000 meq | EXTENDED_RELEASE_TABLET | Freq: Once | ORAL | Status: AC
  Administered 2018-10-26: 40 meq via ORAL
  Filled 2018-10-26: qty 2

## 2018-10-26 MED ORDER — BUDESONIDE 0.5 MG/2ML IN SUSP
0.5000 mg | Freq: Two times a day (BID) | RESPIRATORY_TRACT | Status: DC
  Administered 2018-10-26 – 2018-10-28 (×4): 0.5 mg via RESPIRATORY_TRACT
  Filled 2018-10-26 (×4): qty 2

## 2018-10-26 NOTE — Plan of Care (Signed)
Encourage patient to get out of bed for meals and ambulate in the room.

## 2018-10-26 NOTE — Progress Notes (Signed)
Diane Mullins at [place] NAME: Diane Mullins    MR#:  [id number]  DATE OF BIRTH:  05/07/1933  SUBJECTIVE:   Family is at bedside.  Patient's heart rates are stable, still having some wheezing, cough and congestion this morning.  REVIEW OF SYSTEMS:    Review of Systems  Constitutional: Negative for chills and fever.  HENT: Negative for congestion and tinnitus.   Eyes: Negative for blurred vision and double vision.  Respiratory: Positive for cough and wheezing. Negative for shortness of breath.   Cardiovascular: Negative for chest pain, orthopnea and PND.  Gastrointestinal: Negative for abdominal pain, diarrhea, nausea and vomiting.  Genitourinary: Negative for dysuria and hematuria.  Neurological: Negative for dizziness, sensory change and focal weakness.  All other systems reviewed and are negative.   Nutrition: Heart Healthy/Carb control Tolerating Diet: Yes Tolerating PT: Eval noted.   DRUG ALLERGIES:   Allergies  Allergen Reactions  . Oxycodone     Other reaction(s): Confusion Patient's daughter reported confusion/sensitivity to oxycodone.     VITALS:  Blood pressure 140/63, pulse 82, temperature (!) 97.3 F (36.3 C), temperature source Oral, resp. rate 20, height 5\' 1"  (1.549 m), weight 85.6 kg, SpO2 93 %.  PHYSICAL EXAMINATION:   Physical Exam  GENERAL:  85 y.o.-year-old patient lying in bed in mild REsp. Distress with wheezing. EYES: Pupils equal, round, reactive to light and accommodation. No scleral icterus. Extraocular muscles intact.  HEENT: Head atraumatic, normocephalic. Oropharynx and nasopharynx clear.  NECK:  Supple, no jugular venous distention. No thyroid enlargement, no tenderness.  LUNGS: Good Air entry bilaterally, diffuse expiratory wheezing bilaterally, no rales, rhonchi.  Negative use of accessory muscles.  CARDIOVASCULAR: S1, S2 normal. No murmurs, rubs, or gallops.  ABDOMEN: Soft, nontender,  nondistended. Bowel sounds present. No organomegaly or mass.  EXTREMITIES: No cyanosis, clubbing or edema b/l.    NEUROLOGIC: Cranial nerves II through XII are intact. No focal Motor or sensory deficits b/l.  Globally weak.   PSYCHIATRIC: The patient is alert and oriented x 3.  SKIN: No obvious rash, lesion, or ulcer.    LABORATORY PANEL:   CBC Recent Labs  Lab 10/24/18 1323  WBC 7.0  HGB 12.0  HCT 37.6  PLT 239   ------------------------------------------------------------------------------------------------------------------  Chemistries  Recent Labs  Lab 10/25/18 0650 10/26/18 0424  NA  --  136  K  --  3.1*  CL  --  89*  CO2  --  39*  GLUCOSE  --  160*  BUN  --  27*  CREATININE  --  1.05*  CALCIUM  --  8.8*  MG 1.7  --    ------------------------------------------------------------------------------------------------------------------  Cardiac Enzymes No results for input(s): TROPONINI in the last 168 hours. ------------------------------------------------------------------------------------------------------------------  RADIOLOGY:  Dg Chest Port 1 View  Result Date: 10/26/2018 CLINICAL DATA:  Shortness of breath EXAM: PORTABLE CHEST 1 VIEW COMPARISON:  10/24/2018 FINDINGS: The heart is enlarged but stable. Mild vascular congestion and probable some residual interstitial edema but improved aeration. No definite pleural effusions. Stable hiatal hernia. IMPRESSION: Improved lung aeration with resolving edema. Electronically Signed   By: Marijo Sanes M.D.   On: 10/26/2018 11:54     ASSESSMENT AND PLAN:   85 year old elderly female patient with history of diabetes mellitus type 2, hypertension, TIA in the past, pulmonary hypertension, diastolic heart failure currently under hospitalist service for palpitations and dizziness  *Acute atrial flutter with RVR, new onset Resolved-s/p cardioversion October 22, 2018 by  Dr. Saunders Revel Per cardiology-continue to hold AV nodal  blocking agents given bradycardia after cardioversion, continue Eliquis  *Acute on chronic diastolic heart failure exacerbation   -Improved with diuresis.  Will taper Lasix today. -Follow I's and O's and daily weights.  Beta-blockers on hold due to some relative bradycardia.    * Acute Resp. Distress with Wheezing  -Etiology unclear, suspected to be secondary to CHF but despite diuresis patient continues to have some wheezing bronchospasm.  As per the patient's daughter she has some asthma/COPD.  Will give 1 dose of IV steroids, continue nebulizer treatments, will add inhaled steroid nebulizers. -Follow response.  *Chronic diabetes mellitus type 2  - cont. Lantus, SSI. BS stable.   *Acute UTI  Resolved Treated with course of Rocephin, urine culture noted for contaminant   *Chronic B12 deficiency Continue vitamin B12  *Acute ? Slurred speech Resolved Since admission per family, during my time with patient-I never noticed any slurred speech or neurological deficits, patient does not complain of any focal motor weakness or paresthesias MRI brain negative for any acute process, neurochecks per routine, on Eliquis,  statin therapy  * Hypothyroidism - cont. Synthroid.   * Hypokalemia - due to diuresis. Will cont. To supplement and repeat in a.m  - Mg. Level is normal.   Most likely discharge to SNF tomorrow if REsp. Status improved.   All the records are reviewed and case discussed with Care Management/Social Worker. Management plans discussed with the patient, family and they are in agreement.  CODE STATUS: Full code  DVT Prophylaxis: Eliquis  TOTAL TIME TAKING CARE OF THIS PATIENT: 30 minutes.   POSSIBLE D/C IN 1-2 DAYS, DEPENDING ON CLINICAL CONDITION.   Henreitta Leber M.D on 10/26/2018 at 2:32 PM  Between 7am to 6pm - Pager - [phone]  After 6pm go to [email or web address] - [employer] [place] [facility]  Office   [phone]  CC: Primary care physician; [name], MD

## 2018-10-27 LAB — GLUCOSE, CAPILLARY
GLUCOSE-CAPILLARY: 283 mg/dL — AB (ref 70–99)
Glucose-Capillary: 152 mg/dL — ABNORMAL HIGH (ref 70–99)
Glucose-Capillary: 106 mg/dL — ABNORMAL HIGH (ref 70–99)
Glucose-Capillary: 192 mg/dL — ABNORMAL HIGH (ref 70–99)

## 2018-10-27 NOTE — Clinical Social Work Note (Signed)
CSW aware via chart review that the patient may be cleared for discharge, today. The CSW has contacted [employer] to initiate authorization request as directed by the insurance provider. The CSW is waiting for insurance authorization and is following.  Santiago Bumpers, MSW, Latanya Presser [phone]

## 2018-10-27 NOTE — Progress Notes (Signed)
Diane Mullins at [place] NAME: Diane Mullins    MR#:  [id number]  DATE OF BIRTH:  [birth year]  SUBJECTIVE:   Still having some cough which is nonproductive and wheezing expiratory.  Heart rates are stable.  Awaiting insurance authorization for discharge to short-term rehab.    REVIEW OF SYSTEMS:    Review of Systems  Constitutional: Negative for chills and fever.  HENT: Negative for congestion and tinnitus.   Eyes: Negative for blurred vision and double vision.  Respiratory: Positive for cough and wheezing. Negative for shortness of breath.   Cardiovascular: Negative for chest pain, orthopnea and PND.  Gastrointestinal: Negative for abdominal pain, diarrhea, nausea and vomiting.  Genitourinary: Negative for dysuria and hematuria.  Neurological: Negative for dizziness, sensory change and focal weakness.  All other systems reviewed and are negative.   Nutrition: Heart Healthy/Carb control Tolerating Diet: Yes Tolerating PT: Eval noted.   DRUG ALLERGIES:   Allergies  Allergen Reactions  . Oxycodone     Other reaction(s): Confusion Patient's daughter reported confusion/sensitivity to oxycodone.     VITALS:  Blood pressure 138/65, pulse 83, temperature (!) 97.4 F (36.3 C), temperature source Oral, resp. rate 20, height 5\' 1"  (1.549 m), weight 92.4 kg, SpO2 97 %.  PHYSICAL EXAMINATION:   Physical Exam  GENERAL:  85 y.o.-year-old patient lying in bed in NAD. EYES: Pupils equal, round, reactive to light and accommodation. No scleral icterus. Extraocular muscles intact.  HEENT: Head atraumatic, normocephalic. Oropharynx and nasopharynx clear.  NECK:  Supple, no jugular venous distention. No thyroid enlargement, no tenderness.  LUNGS: Good Air entry bilaterally, diffuse expiratory wheezing bilaterally, no rales, rhonchi.  Negative use of accessory muscles.  CARDIOVASCULAR: S1, S2 normal. No murmurs, rubs, or gallops.  ABDOMEN: Soft,  nontender, nondistended. Bowel sounds present. No organomegaly or mass.  EXTREMITIES: No cyanosis, clubbing or edema b/l.    NEUROLOGIC: Cranial nerves II through XII are intact. No focal Motor or sensory deficits b/l.  Globally weak.   PSYCHIATRIC: The patient is alert and oriented x 3.  SKIN: No obvious rash, lesion, or ulcer.    LABORATORY PANEL:   CBC Recent Labs  Lab 10/24/18 1323  WBC 7.0  HGB 12.0  HCT 37.6  PLT 239   ------------------------------------------------------------------------------------------------------------------  Chemistries  Recent Labs  Lab 10/25/18 0650 10/26/18 0424  NA  --  136  K  --  3.1*  CL  --  89*  CO2  --  39*  GLUCOSE  --  160*  BUN  --  27*  CREATININE  --  1.05*  CALCIUM  --  8.8*  MG 1.7  --    ------------------------------------------------------------------------------------------------------------------  Cardiac Enzymes No results for input(s): TROPONINI in the last 168 hours. ------------------------------------------------------------------------------------------------------------------  RADIOLOGY:  Dg Chest Port 1 View  Result Date: 10/26/2018 CLINICAL DATA:  Shortness of breath EXAM: PORTABLE CHEST 1 VIEW COMPARISON:  10/24/2018 FINDINGS: The heart is enlarged but stable. Mild vascular congestion and probable some residual interstitial edema but improved aeration. No definite pleural effusions. Stable hiatal hernia. IMPRESSION: Improved lung aeration with resolving edema. Electronically Signed   By: Marijo Sanes M.D.   On: 10/26/2018 11:54     ASSESSMENT AND PLAN:   85 year old elderly female patient with history of diabetes mellitus type 2, hypertension, TIA in the past, pulmonary hypertension, diastolic heart failure currently under hospitalist service for palpitations and dizziness  *Acute atrial flutter with RVR, new onset Resolved-s/p cardioversion October 22, 2018 by Dr. Saunders Revel Per cardiology-continue to hold  AV nodal blocking agents given bradycardia after cardioversion, continue Eliquis  *Acute on chronic diastolic heart failure exacerbation   - Improved with diuresis.  Cont. IV lasix and will switch to Oral in a.m.  - about 8 L (-) since admission.  -  Beta-blockers on hold due to some relative bradycardia.  Appreciate Cardiology help.   * Acute Resp. Distress with Wheezing  -Etiology unclear, suspected to be secondary to CHF but despite diuresis patient continues to have some wheezing bronchospasm.  As per the patient's daughter she has some asthma/COPD.  - cont. Pulmicort nebs, Xopenex, Atrovent nebs and pt. Is slowly improving. Cont. Antitussives.  - CXR yesterday showing improvement in pulm. Edema.   *Chronic diabetes mellitus type 2  - cont. Lantus, SSI. BS stable.   *Acute UTI  Resolved Treated with course of Rocephin, urine culture noted for contaminant   *Chronic B12 deficiency Continue vitamin B12  *Acute ? Slurred speech Resolved Since admission per family, during my time with patient-I never noticed any slurred speech or neurological deficits, patient does not complain of any focal motor weakness or paresthesias MRI brain negative for any acute process, neurochecks per routine, on Eliquis,  statin therapy  * Hypothyroidism - cont. Synthroid.   * Hypokalemia - due to diuresis. - will cont. To supplement and repeat in a.m.   Most likely discharge to SNF tomorrow once insurance authorization received.   All the records are reviewed and case discussed with Care Management/Social Worker. Management plans discussed with the patient, family and they are in agreement.  CODE STATUS: Full code  DVT Prophylaxis: Eliquis  TOTAL TIME TAKING CARE OF THIS PATIENT: 30 minutes.   POSSIBLE D/C IN 1-2 DAYS, DEPENDING ON CLINICAL CONDITION.   Henreitta Leber M.D on 10/27/2018 at 2:22 PM  Between 7am to 6pm - Pager - [phone]  After 6pm go to [email or web address] - [employer], building services [facility]  Office  [phone]  CC: Primary care physician; [name], MD

## 2018-10-28 ENCOUNTER — Telehealth: Payer: Self-pay

## 2018-10-28 DIAGNOSIS — E1159 Type 2 diabetes mellitus with other circulatory complications: Secondary | ICD-10-CM | POA: Diagnosis not present

## 2018-10-28 DIAGNOSIS — I272 Pulmonary hypertension, unspecified: Secondary | ICD-10-CM | POA: Diagnosis not present

## 2018-10-28 DIAGNOSIS — H8110 Benign paroxysmal vertigo, unspecified ear: Secondary | ICD-10-CM | POA: Diagnosis not present

## 2018-10-28 DIAGNOSIS — I4891 Unspecified atrial fibrillation: Secondary | ICD-10-CM | POA: Diagnosis not present

## 2018-10-28 DIAGNOSIS — Z741 Need for assistance with personal care: Secondary | ICD-10-CM | POA: Diagnosis not present

## 2018-10-28 DIAGNOSIS — I4892 Unspecified atrial flutter: Secondary | ICD-10-CM | POA: Diagnosis not present

## 2018-10-28 DIAGNOSIS — Z8673 Personal history of transient ischemic attack (TIA), and cerebral infarction without residual deficits: Secondary | ICD-10-CM | POA: Diagnosis not present

## 2018-10-28 DIAGNOSIS — E119 Type 2 diabetes mellitus without complications: Secondary | ICD-10-CM | POA: Diagnosis not present

## 2018-10-28 DIAGNOSIS — I483 Typical atrial flutter: Secondary | ICD-10-CM | POA: Diagnosis not present

## 2018-10-28 DIAGNOSIS — R05 Cough: Secondary | ICD-10-CM | POA: Diagnosis not present

## 2018-10-28 DIAGNOSIS — I10 Essential (primary) hypertension: Secondary | ICD-10-CM | POA: Diagnosis not present

## 2018-10-28 DIAGNOSIS — R2689 Other abnormalities of gait and mobility: Secondary | ICD-10-CM | POA: Diagnosis not present

## 2018-10-28 DIAGNOSIS — J9601 Acute respiratory failure with hypoxia: Secondary | ICD-10-CM | POA: Diagnosis not present

## 2018-10-28 DIAGNOSIS — R42 Dizziness and giddiness: Secondary | ICD-10-CM | POA: Diagnosis not present

## 2018-10-28 DIAGNOSIS — I5032 Chronic diastolic (congestive) heart failure: Secondary | ICD-10-CM | POA: Diagnosis not present

## 2018-10-28 DIAGNOSIS — I110 Hypertensive heart disease with heart failure: Secondary | ICD-10-CM | POA: Diagnosis not present

## 2018-10-28 DIAGNOSIS — K219 Gastro-esophageal reflux disease without esophagitis: Secondary | ICD-10-CM | POA: Diagnosis not present

## 2018-10-28 DIAGNOSIS — R278 Other lack of coordination: Secondary | ICD-10-CM | POA: Diagnosis not present

## 2018-10-28 DIAGNOSIS — Z7401 Bed confinement status: Secondary | ICD-10-CM | POA: Diagnosis not present

## 2018-10-28 DIAGNOSIS — M6281 Muscle weakness (generalized): Secondary | ICD-10-CM | POA: Diagnosis not present

## 2018-10-28 DIAGNOSIS — M2550 Pain in unspecified joint: Secondary | ICD-10-CM | POA: Diagnosis not present

## 2018-10-28 DIAGNOSIS — W19XXXA Unspecified fall, initial encounter: Secondary | ICD-10-CM | POA: Diagnosis not present

## 2018-10-28 LAB — BASIC METABOLIC PANEL
Anion gap: 7 (ref 5–15)
BUN: 32 mg/dL — ABNORMAL HIGH (ref 8–23)
CO2: 38 mmol/L — ABNORMAL HIGH (ref 22–32)
Calcium: 9 mg/dL (ref 8.9–10.3)
Chloride: 91 mmol/L — ABNORMAL LOW (ref 98–111)
Creatinine, Ser: 1.11 mg/dL — ABNORMAL HIGH (ref 0.44–1.00)
GFR calc Af Amer: 53 mL/min — ABNORMAL LOW (ref >60)
GFR calc non Af Amer: 46 mL/min — ABNORMAL LOW (ref >60)
Glucose, Bld: 131 mg/dL — ABNORMAL HIGH (ref 70–99)
Potassium: 4.2 mmol/L (ref 3.5–5.1)
Sodium: 136 mmol/L (ref 135–145)

## 2018-10-28 LAB — MAGNESIUM: Magnesium: 1.9 mg/dL (ref 1.7–2.4)

## 2018-10-28 LAB — GLUCOSE, CAPILLARY
GLUCOSE-CAPILLARY: 190 mg/dL — AB (ref 70–99)
Glucose-Capillary: 146 mg/dL — ABNORMAL HIGH (ref 70–99)
Glucose-Capillary: 289 mg/dL — ABNORMAL HIGH (ref 70–99)

## 2018-10-28 MED ORDER — APIXABAN 5 MG PO TABS: 5.0000 mg | ORAL_TABLET | Freq: Two times a day (BID) | ORAL | Status: DC

## 2018-10-28 MED ORDER — IPRATROPIUM-ALBUTEROL 0.5-2.5 (3) MG/3ML IN SOLN: 3.0000 mL | Freq: Four times a day (QID) | RESPIRATORY_TRACT | Status: DC | PRN

## 2018-10-28 NOTE — Clinical Social Work Note (Signed)
CSW received phone call that patient has received insurance auth for patient to go to SNF.  [name]. Anaise Sterbenz, MSW, [place]  10/28/2018 10:18 AM

## 2018-10-28 NOTE — Clinical Social Work Placement (Signed)
   CLINICAL SOCIAL WORK PLACEMENT  NOTE  Date:  10/28/2018  Patient Details  Name: Diane Mullins MRN: [id number] Date of Birth: May 21, 1933  Clinical Social Work is seeking post-discharge placement for this patient at the [place] level of care (*CSW will initial, date and re-position this form in  chart as items are completed):  Yes   Patient/family provided with [facility] [place] Work Department's list of facilities offering this level of care within the geographic area requested by the patient (or if unable, by the patient's family).  Yes   Patient/family informed of their freedom to choose among providers that offer the needed level of care, that participate in Medicare, Medicaid or managed care program needed by the patient, have an available bed and are willing to accept the patient.  Yes   Patient/family informed of [facility]'s ownership interest in [facility] and [facility], as well as of the fact that they are under no obligation to receive care at these facilities.  PASRR submitted to EDS on 10/25/18     PASRR number received on       Existing PASRR number confirmed on 10/25/18     FL2 transmitted to all facilities in geographic area requested by pt/family on       FL2 transmitted to all facilities within larger geographic area on       Patient informed that his/her managed care company has contracts with or will negotiate with certain facilities, including the following:        Yes   Patient/family informed of bed offers received.  Patient chooses bed at [facility]     Physician recommends and patient chooses bed at      Patient to be transferred to [facility] on 10/28/18.  Patient to be transferred to facility by [facility]     Patient family notified on 10/28/18 of transfer.  Name of family member notified:  [name] patient's daughter [phone]     PHYSICIAN Please sign FL2, Please prepare prescriptions, Please  prepare priority discharge summary, including medications     Additional Comment:    _______________________________________________ Ross Ludwig, LCSWA 10/28/2018, 11:41 AM

## 2018-10-28 NOTE — Telephone Encounter (Signed)
Sent to PCP please advise.   Thanks

## 2018-10-28 NOTE — Telephone Encounter (Signed)
Per chart Patient DC to [facility] for rehab for seven days and possible transfer to skill nursing awaiting evaluation at [facility] . [facility] house provider at [facility] is Dr. Silvio Pate I think will follow .

## 2018-10-28 NOTE — Telephone Encounter (Signed)
Noted.  If she needs follow-up in the future with me please have her scheduled.

## 2018-10-28 NOTE — Progress Notes (Signed)
Discharge report called to [facility] / iv and tele removed/ EMS called to transport.

## 2018-10-28 NOTE — Telephone Encounter (Signed)
Copied from [place] [phone]. Topic: Appointment Scheduling - Scheduling Inquiry for Clinic >> Oct 28, 2018 11:08 AM Nils Flack wrote: Reason for CRM: pt needs to have hosp follow up week of 11-04-18. Please see if you can work her in sooner For now she is scheduled 11/11/18

## 2018-10-28 NOTE — Care Management Important Message (Signed)
Copy of signed Medicare IM left with patient in room. 

## 2018-10-28 NOTE — Clinical Social Work Note (Addendum)
CSW received phone call from patient's insurance company the medical director has approved patient for short term rehab, authorization number is [id number] for 7 days.  Patient to be d/c'ed today to [facility] room 327.  Patient and family agreeable to plans will transport via ems RN to call report [phone].  CSW spoke with patient's daughter [name], [phone] and she is aware that patient is discharging today.  CSW also informed her that insurance thinks patient may need a higher level of care like an ALF or possibly SNF.  CSW told patient's daughter, the social worker at SNF can assist with trying to determine the next level of care.   Evette Cristal, MSW, [place]

## 2018-10-28 NOTE — Discharge Summary (Signed)
[facility] [place] NAME: Diane Mullins    MR#:  [id number]  DATE OF BIRTH:  1933-06-16  DATE OF ADMISSION:  10/17/2018 ADMITTING PHYSICIAN: Gladstone Lighter, MD  DATE OF DISCHARGE: 10/28/2018  PRIMARY CARE PHYSICIAN: [name], MD    ADMISSION DIAGNOSIS:  Fluttering heart [I49.8]  DISCHARGE DIAGNOSIS:  Active Problems:   Atrial flutter ([place])   SECONDARY DIAGNOSIS:   Past Medical History:  Diagnosis Date  . Benign paroxysmal positional vertigo 07/17/2014  . Chronic diastolic CHF (congestive heart failure) ([place])    a. echo 10/19: EF of 60-65%, no RWMA, Gr2DD, calcified mitral annulus with mild MR, mildly to moderately dilated left atrium, moderate TR, PASP 52 mmHg  . Diabetes mellitus   . H/O: rheumatic fever   . Hyperlipidemia   . Hypertension   . Pulmonary hypertension ([place])   . Stroke [facility])    TIA Jan. 1st  . Teratoma of left ovary   . Thyroid disease     HOSPITAL COURSE:   85 year old elderly female patient with history of diabetes mellitus type 2, hypertension, TIA in the past, pulmonary hypertension, diastolic heart failure currently under hospitalist service for palpitations and dizziness  *Acute atrial flutter with RVR, new onset Resolved-s/p cardioversion October 22, 2018 by Dr. Saunders Revel Patient was initially bradycardic after cardioversion but that has improved.  Patient will resume her Toprol upon discharge.  She will continue her Eliquis.  Heart rates are stable and she is currently in sinus rhythm.  *Acute on chronic diastolic heart failure exacerbation -Patient improved with IV diuresis and is about 9 L negative since admission.  She clinically feels better. -She will resume her oral Lasix and potassium supplements.  She will continue her Toprol and losartan.  She will follow-up with the heart failure clinic.  * Acute Resp. Distress with Wheezing  -Suspected to be secondary to underlying mild asthma/COPD.   Wheezing and bronchospasm has improved. - Patient will be discharged on duo nebs as needed.  Continue follow-up with outpatient pulmonary if needed. CXR showing improvement in her Pulm. Edema and she is not presently hypoxic.   *Chronic diabetes mellitus type 2  -Sugars remained stable while in the hospital.  Patient will continue her Lantus, metformin.  *Acute UTI  Resolved Treated with course of Rocephin, urine culture noted for contaminant   *Chronic B12 deficiency Continue vitamin B12  *Acute ? Slurred speech Resolved - neurologic work up including MRI brain was (-) for CVA.  Much improved and at baseline now. Cont. Eliquis, Statin.   * Hypothyroidism - pt. Will cont. Synthroid.   * Hypokalemia - due to diuresis. - improved and resolved with supplementation and pt. Will cont. Her Potassium supplements.  DISCHARGE CONDITIONS:   Stable.   CONSULTS OBTAINED:    DRUG ALLERGIES:   Allergies  Allergen Reactions  . Oxycodone     Other reaction(s): Confusion Patient's daughter reported confusion/sensitivity to oxycodone.     DISCHARGE MEDICATIONS:   Allergies as of 10/28/2018      Reactions   Oxycodone    Other reaction(s): Confusion Patient's daughter reported confusion/sensitivity to oxycodone.      Medication List    TAKE these medications   apixaban 5 MG Tabs tablet Commonly known as:  ELIQUIS Take 1 tablet (5 mg total) by mouth 2 (two) times daily.   aspirin EC 81 MG tablet Take 81 mg by mouth daily.   ferrous sulfate 325 (65 FE) MG EC tablet  Take 1 tablet (325 mg total) by mouth daily with breakfast.   furosemide 20 MG tablet Commonly known as:  LASIX Take 1 tablet (20 mg total) by mouth daily.   glucose blood test strip Commonly known as:  ONE TOUCH ULTRA TEST Use as instructed to check Blood sugar three times a day.  E11.65 Dispense patient choice.   ibuprofen 200 MG tablet Commonly known as:  ADVIL,MOTRIN Take 200 mg by mouth every 6  (six) hours as needed.   Insulin Glargine 100 UNIT/ML Solostar Pen Commonly known as:  LANTUS SOLOSTAR INJECT  17 UNITS SUBCUTANEOUSLY IN THE MORNING   ipratropium-albuterol 0.5-2.5 (3) MG/3ML Soln Commonly known as:  DUONEB Take 3 mLs by nebulization every 6 (six) hours as needed.   levothyroxine 75 MCG tablet Commonly known as:  SYNTHROID, LEVOTHROID Take 1 tablet (75 mcg total) by mouth daily.   losartan 50 MG tablet Commonly known as:  COZAAR Take 50 mg by mouth daily.   metFORMIN 1000 MG tablet Commonly known as:  GLUCOPHAGE Take 1 tablet (1,000 mg total) by mouth 2 (two) times daily with a meal. TAKE 1 TABLET TWICE DAILY  WITH  A  MEAL   metoprolol succinate 25 MG 24 hr tablet Commonly known as:  TOPROL-XL Take 1 tablet (25 mg total) by mouth daily.   omeprazole 20 MG capsule Commonly known as:  PRILOSEC Take 1 capsule (20 mg total) by mouth daily.   Pen Needles 32G X 4 MM Misc Inject 1 Dose into the skin daily.   polyethylene glycol packet Commonly known as:  MIRALAX / GLYCOLAX Take 17 g by mouth daily as needed.   potassium chloride 10 MEQ tablet Commonly known as:  K-DUR TAKE 1 TABLET BY MOUTH EVERY DAY   simvastatin 40 MG tablet Commonly known as:  ZOCOR Take 1 tablet (40 mg total) by mouth every evening.         DISCHARGE INSTRUCTIONS:   DIET:  Cardiac diet and Diabetic diet  DISCHARGE CONDITION:  Stable  ACTIVITY:  Activity as tolerated  OXYGEN:  Home Oxygen: No.   Oxygen Delivery: room air  DISCHARGE LOCATION:  nursing home   If you experience worsening of your admission symptoms, develop shortness of breath, life threatening emergency, suicidal or homicidal thoughts you must seek medical attention immediately by calling 911 or calling your MD immediately  if symptoms less severe.  You Must read complete instructions/literature along with all the possible adverse reactions/side effects for all the Medicines you take and that have  been prescribed to you. Take any new Medicines after you have completely understood and accpet all the possible adverse reactions/side effects.   Please note  You were cared for by a hospitalist during your hospital stay. If you have any questions about your discharge medications or the care you received while you were in the hospital after you are discharged, you can call the unit and asked to speak with the hospitalist on call if the hospitalist that took care of you is not available. Once you are discharged, your primary care physician will handle any further medical issues. Please note that NO REFILLS for any discharge medications will be authorized once you are discharged, as it is imperative that you return to your primary care physician (or establish a relationship with a primary care physician if you do not have one) for your aftercare needs so that they can reassess your need for medications and monitor your lab values.  Today   Shortness of breath and wheezing is improved over the past 2 days.  Heart rates are stable.  Will discharge to skilled nursing facility today.  VITAL SIGNS:  Blood pressure (!) 157/64, pulse 94, temperature 98 F (36.7 C), temperature source Oral, resp. rate 20, height 5\' 1"  (1.549 m), weight 92.6 kg, SpO2 93 %.  I/O:    Intake/Output Summary (Last 24 hours) at 10/28/2018 1032 Last data filed at 10/28/2018 0937 Gross per 24 hour  Intake 360 ml  Output 1250 ml  Net -890 ml    PHYSICAL EXAMINATION:   GENERAL:  85 y.o.-year-old patient lying in bed in NAD. EYES: Pupils equal, round, reactive to light and accommodation. No scleral icterus. Extraocular muscles intact.  HEENT: Head atraumatic, normocephalic. Oropharynx and nasopharynx clear.  NECK:  Supple, no jugular venous distention. No thyroid enlargement, no tenderness.  LUNGS: Good Air entry bilaterally, minimal end-exp. Wheezing b/l, no rales, rhonchi.  Negative use of accessory muscles.   CARDIOVASCULAR: S1, S2 normal. No murmurs, rubs, or gallops.  ABDOMEN: Soft, nontender, nondistended. Bowel sounds present. No organomegaly or mass.  EXTREMITIES: No cyanosis, clubbing or edema b/l.    NEUROLOGIC: Cranial nerves II through XII are intact. No focal Motor or sensory deficits b/l.  Globally weak.   PSYCHIATRIC: The patient is alert and oriented x 3.  SKIN: No obvious rash, lesion, or ulcer.   DATA REVIEW:   CBC Recent Labs  Lab 10/24/18 1323  WBC 7.0  HGB 12.0  HCT 37.6  PLT 239    Chemistries  Recent Labs  Lab 10/28/18 0520  NA 136  K 4.2  CL 91*  CO2 38*  GLUCOSE 131*  BUN 32*  CREATININE 1.11*  CALCIUM 9.0  MG 1.9    Cardiac Enzymes No results for input(s): TROPONINI in the last 168 hours.  Microbiology Results  Results for orders placed or performed during the hospital encounter of 10/17/18  Urine Culture     Status: Abnormal   Collection Time: 10/17/18  4:48 PM  Result Value Ref Range Status   Specimen Description   Final    URINE, RANDOM Performed at [facility], [address], [place], [place] [address]    Special Requests   Final    NONE Performed at [facility], [address] [place], [place] [address]    Culture MULTIPLE SPECIES PRESENT, SUGGEST RECOLLECTION (A)  Final   Report Status 10/19/2018 FINAL  Final  MRSA PCR Screening     Status: None   Collection Time: 10/19/18 10:06 PM  Result Value Ref Range Status   MRSA by PCR NEGATIVE NEGATIVE Final    Comment:        The GeneXpert MRSA Assay (FDA approved for NASAL specimens only), is one component of a comprehensive MRSA colonization surveillance program. It is not intended to diagnose MRSA infection nor to guide or monitor treatment for MRSA infections. Performed at [facility], [address] [place], [place] [address]     RADIOLOGY:  Dg Chest Port 1 View  Result Date: 10/26/2018 CLINICAL DATA:  Shortness of breath EXAM: PORTABLE  CHEST 1 VIEW COMPARISON:  10/24/2018 FINDINGS: The heart is enlarged but stable. Mild vascular congestion and probable some residual interstitial edema but improved aeration. No definite pleural effusions. Stable hiatal hernia. IMPRESSION: Improved lung aeration with resolving edema. Electronically Signed   By: Marijo Sanes M.D.   On: 10/26/2018 11:54      Management plans discussed  with the patient, family and they are in agreement.  CODE STATUS:     Code Status Orders  (From admission, onward)         Start     Ordered   10/17/18 1655  Full code  Continuous     10/17/18 1654          TOTAL TIME TAKING CARE OF THIS PATIENT: 45 minutes.    Henreitta Leber M.D on 10/28/2018 at 10:32 AM  Between 7am to 6pm - Pager - [phone]  After 6pm go to [email or web address] - [employer] [facility]  Office  [phone]  CC: Primary care physician; [name], MD

## 2018-10-28 NOTE — Telephone Encounter (Signed)
It appears I have an opening at 1:45 on 11/06/2018.  If this is still open she can be placed there.

## 2018-10-31 DIAGNOSIS — R05 Cough: Secondary | ICD-10-CM | POA: Diagnosis not present

## 2018-10-31 DIAGNOSIS — I5032 Chronic diastolic (congestive) heart failure: Secondary | ICD-10-CM | POA: Diagnosis not present

## 2018-10-31 DIAGNOSIS — I483 Typical atrial flutter: Secondary | ICD-10-CM | POA: Diagnosis not present

## 2018-10-31 DIAGNOSIS — I272 Pulmonary hypertension, unspecified: Secondary | ICD-10-CM

## 2018-10-31 DIAGNOSIS — E1159 Type 2 diabetes mellitus with other circulatory complications: Secondary | ICD-10-CM | POA: Diagnosis not present

## 2018-11-01 ENCOUNTER — Telehealth: Payer: Self-pay | Admitting: Family Medicine

## 2018-11-01 NOTE — Telephone Encounter (Signed)
No charge--she did the work (though not sure they will take the information she gave---daily care with indefinite end date)

## 2018-11-01 NOTE — Telephone Encounter (Signed)
Copy for pt Copy for scan Left message letting robyn (daughter) know paperwork is ready for pick up

## 2018-11-01 NOTE — Telephone Encounter (Signed)
Diane Mullins (daughter) dropped of FMLA paperwork. In Dr Alla German in box for review and signature Paperwork was already filled out

## 2018-11-05 ENCOUNTER — Ambulatory Visit: Payer: PPO | Admitting: Family Medicine

## 2018-11-06 ENCOUNTER — Ambulatory Visit: Payer: PPO | Admitting: Family

## 2018-11-06 ENCOUNTER — Ambulatory Visit: Payer: PPO | Admitting: Family Medicine

## 2018-11-11 ENCOUNTER — Inpatient Hospital Stay: Payer: PPO | Admitting: Family Medicine

## 2018-11-12 ENCOUNTER — Ambulatory Visit: Payer: PPO | Admitting: Family

## 2018-11-15 ENCOUNTER — Telehealth: Payer: Self-pay | Admitting: *Deleted

## 2018-11-15 NOTE — Telephone Encounter (Signed)
Noted  

## 2018-11-15 NOTE — Telephone Encounter (Signed)
Copied from [place] [phone]. Topic: Referral - Status >> Nov 15, 2018 10:39 AM Reyne Dumas L wrote: Reason for CRM:   Tonya from [facility] calling.  States they did receive the referral and plan to see pt on 11/18/2018

## 2018-11-18 ENCOUNTER — Other Ambulatory Visit: Payer: Self-pay | Admitting: *Deleted

## 2018-11-18 DIAGNOSIS — I4891 Unspecified atrial fibrillation: Secondary | ICD-10-CM | POA: Diagnosis not present

## 2018-11-18 DIAGNOSIS — N183 Chronic kidney disease, stage 3 (moderate): Secondary | ICD-10-CM | POA: Diagnosis not present

## 2018-11-18 DIAGNOSIS — Z7901 Long term (current) use of anticoagulants: Secondary | ICD-10-CM | POA: Diagnosis not present

## 2018-11-18 DIAGNOSIS — I130 Hypertensive heart and chronic kidney disease with heart failure and stage 1 through stage 4 chronic kidney disease, or unspecified chronic kidney disease: Secondary | ICD-10-CM | POA: Diagnosis not present

## 2018-11-18 DIAGNOSIS — K219 Gastro-esophageal reflux disease without esophagitis: Secondary | ICD-10-CM | POA: Diagnosis not present

## 2018-11-18 DIAGNOSIS — E039 Hypothyroidism, unspecified: Secondary | ICD-10-CM | POA: Diagnosis not present

## 2018-11-18 DIAGNOSIS — F4322 Adjustment disorder with anxiety: Secondary | ICD-10-CM | POA: Diagnosis not present

## 2018-11-18 DIAGNOSIS — K529 Noninfective gastroenteritis and colitis, unspecified: Secondary | ICD-10-CM | POA: Diagnosis not present

## 2018-11-18 DIAGNOSIS — I700 Atherosclerosis of aorta: Secondary | ICD-10-CM | POA: Diagnosis not present

## 2018-11-18 DIAGNOSIS — Z9181 History of falling: Secondary | ICD-10-CM | POA: Diagnosis not present

## 2018-11-18 DIAGNOSIS — Z8673 Personal history of transient ischemic attack (TIA), and cerebral infarction without residual deficits: Secondary | ICD-10-CM | POA: Diagnosis not present

## 2018-11-18 DIAGNOSIS — D271 Benign neoplasm of left ovary: Secondary | ICD-10-CM | POA: Diagnosis not present

## 2018-11-18 DIAGNOSIS — Z794 Long term (current) use of insulin: Secondary | ICD-10-CM | POA: Diagnosis not present

## 2018-11-18 DIAGNOSIS — D500 Iron deficiency anemia secondary to blood loss (chronic): Secondary | ICD-10-CM | POA: Diagnosis not present

## 2018-11-18 DIAGNOSIS — M1990 Unspecified osteoarthritis, unspecified site: Secondary | ICD-10-CM | POA: Diagnosis not present

## 2018-11-18 DIAGNOSIS — I2510 Atherosclerotic heart disease of native coronary artery without angina pectoris: Secondary | ICD-10-CM | POA: Diagnosis not present

## 2018-11-18 DIAGNOSIS — K5790 Diverticulosis of intestine, part unspecified, without perforation or abscess without bleeding: Secondary | ICD-10-CM | POA: Diagnosis not present

## 2018-11-18 DIAGNOSIS — I272 Pulmonary hypertension, unspecified: Secondary | ICD-10-CM | POA: Diagnosis not present

## 2018-11-18 DIAGNOSIS — E1122 Type 2 diabetes mellitus with diabetic chronic kidney disease: Secondary | ICD-10-CM | POA: Diagnosis not present

## 2018-11-18 DIAGNOSIS — Z6837 Body mass index (BMI) 37.0-37.9, adult: Secondary | ICD-10-CM | POA: Diagnosis not present

## 2018-11-18 DIAGNOSIS — E785 Hyperlipidemia, unspecified: Secondary | ICD-10-CM | POA: Diagnosis not present

## 2018-11-18 DIAGNOSIS — I5033 Acute on chronic diastolic (congestive) heart failure: Secondary | ICD-10-CM | POA: Diagnosis not present

## 2018-11-18 DIAGNOSIS — I4892 Unspecified atrial flutter: Secondary | ICD-10-CM | POA: Diagnosis not present

## 2018-11-18 NOTE — Patient Outreach (Signed)
[facility]) Care Management  11/18/2018  Diane Mullins 05/02/1933 [id number]   Subjective: Telephone call to patient's home number, no answer, left HIPAA compliant voicemail message, and requested call back.    Objective: Per KPN (Knowledge Performance Now, point of care tool) and chart review, patient hospitalized 10/17/2018 -10/28/2018 for atrial flutter.   Patient also has a history of diabetes, chronic diastolic congestive heart failure, pulmonary hypertension, and thyroid disease.      Assessment: Received [employer] Management referral on 11/13/2018.  Referral reason: Member currently at [facility] skilled nursing facility, pending discharge on 2/26 with home health services.  Member has a history of Congestive Heart Failure and diabetes.   Member is a resident of [facility] / Assisted Living facility.    Screening  follow up pending patient contact.       Plan: RNCM will send unsuccessful outreach  letter, [facility] pamphlet, will call patient for 2nd telephone outreach attempt within 4 business days, screening follow up, and will proceed with case closure within 10 business days if no return call.       Taunia Frasco H. Annia Friendly, BSN, [place] Management [facility] Telephonic CM Phone: [phone] Fax: [phone]

## 2018-11-21 ENCOUNTER — Other Ambulatory Visit: Payer: Self-pay

## 2018-11-21 NOTE — Patient Outreach (Signed)
[facility]) Care Management  11/21/2018  Diane Mullins 06/11/1933 [id number]   Referral Date: 11/13/2018 Referral Source: [facility] Referral Reason: Member currently at [facility] skilled nursing facility, pending discharge on 2/26 with home health services.  Member has a history of Congestive Heart Failure and diabetes.   Member is a resident of [facility] / Assisted Living facility.     Outreach Attempt: no answer.  Unable to leave a message.    Plan: Outreach to patient within 4 business days.     Jone Baseman, RN, MSN [facility] Care Management Care Management Coordinator Direct Line [phone] Toll Free: [phone]  Fax: [phone]

## 2018-11-22 ENCOUNTER — Other Ambulatory Visit: Payer: Self-pay | Admitting: Family Medicine

## 2018-11-22 NOTE — Telephone Encounter (Signed)
Copied from [place] [phone]. Topic: Quick Communication - Rx Refill/Question >> Nov 22, 2018  9:09 AM Rayann Heman wrote: Medication: apixaban (ELIQUIS) 5 MG TABS tablet [[id number]] will not have enough until appointment.   Has the patient contacted their pharmacy? no Preferred Pharmacy (with phone number or street name): [id number] [address], [place] [phone] (Phone) [phone] (Fax)

## 2018-11-22 NOTE — Telephone Encounter (Signed)
Requested medication (s) are due for refill today: Yes  Requested medication (s) are on the active medication list: Yes  Last refill:  10/28/18 by another provider  Future visit scheduled: Yes  Notes to clinic:  Will need enough until 11/27/18 appointment, out of medication.     Requested Prescriptions  Pending Prescriptions Disp Refills   apixaban (ELIQUIS) 5 MG TABS tablet 60 tablet     Sig: Take 1 tablet (5 mg total) by mouth 2 (two) times daily.     Hematology:  Anticoagulants Failed - 11/22/2018  9:51 AM      Failed - Cr in normal range and within 360 days    Creatinine, Ser  Date Value Ref Range Status  10/28/2018 1.11 (H) 0.44 - 1.00 mg/dL Final         Passed - HGB in normal range and within 360 days    Hemoglobin  Date Value Ref Range Status  10/24/2018 12.0 12.0 - 15.0 g/dL Final  08/02/2018 11.2 11.1 - 15.9 g/dL Final         Passed - PLT in normal range and within 360 days    Platelets  Date Value Ref Range Status  10/24/2018 239 150 - 400 K/uL Final  08/02/2018 255 150 - 450 x10E3/uL Final         Passed - HCT in normal range and within 360 days    HCT  Date Value Ref Range Status  10/24/2018 37.6 36.0 - 46.0 % Final   Hematocrit  Date Value Ref Range Status  08/02/2018 35.4 34.0 - 46.6 % Final         Passed - Valid encounter within last 12 months    Recent Outpatient Visits          1 month ago Dizziness   [place] Guse, Jacquelynn Cree, FNP   2 months ago Essential hypertension   [place], Jacquelynn Cree, FNP   3 months ago Essential hypertension   [place] Guse, Jacquelynn Cree, FNP   7 months ago Mixed hyperlipidemia   [facility] [place], Angela Adam, MD   10 months ago Type 2 diabetes mellitus with other circulatory complication, with long-term current use of insulin [facility])   [place], Angela Adam, MD      Future Appointments            In 5 days Caryl Bis, Angela Adam, MD [facility], [place]   In 3 months O'Brien-Blaney, Bryson Corona, LPN [place], [place]   In 3 months Caryl Bis, Angela Adam, MD [facility], [facility]

## 2018-11-25 ENCOUNTER — Inpatient Hospital Stay
Admission: EM | Admit: 2018-11-25 | Discharge: 2018-11-28 | DRG: 308 | Disposition: A | Discharge status: Home Health | Payer: PPO | Attending: Internal Medicine | Admitting: Internal Medicine

## 2018-11-25 ENCOUNTER — Other Ambulatory Visit: Payer: Self-pay

## 2018-11-25 ENCOUNTER — Ambulatory Visit: Payer: Self-pay

## 2018-11-25 ENCOUNTER — Telehealth: Payer: Self-pay | Admitting: Family Medicine

## 2018-11-25 ENCOUNTER — Emergency Department: Payer: PPO

## 2018-11-25 ENCOUNTER — Encounter: Payer: Self-pay | Admitting: Intensive Care

## 2018-11-25 DIAGNOSIS — E876 Hypokalemia: Secondary | ICD-10-CM | POA: Diagnosis present

## 2018-11-25 DIAGNOSIS — I5032 Chronic diastolic (congestive) heart failure: Secondary | ICD-10-CM | POA: Diagnosis not present

## 2018-11-25 DIAGNOSIS — I272 Pulmonary hypertension, unspecified: Secondary | ICD-10-CM | POA: Diagnosis present

## 2018-11-25 DIAGNOSIS — J449 Chronic obstructive pulmonary disease, unspecified: Secondary | ICD-10-CM | POA: Diagnosis present

## 2018-11-25 DIAGNOSIS — I509 Heart failure, unspecified: Secondary | ICD-10-CM | POA: Diagnosis not present

## 2018-11-25 DIAGNOSIS — I5033 Acute on chronic diastolic (congestive) heart failure: Secondary | ICD-10-CM | POA: Diagnosis present

## 2018-11-25 DIAGNOSIS — Z794 Long term (current) use of insulin: Secondary | ICD-10-CM | POA: Diagnosis not present

## 2018-11-25 DIAGNOSIS — I4892 Unspecified atrial flutter: Secondary | ICD-10-CM

## 2018-11-25 DIAGNOSIS — I2510 Atherosclerotic heart disease of native coronary artery without angina pectoris: Secondary | ICD-10-CM | POA: Diagnosis present

## 2018-11-25 DIAGNOSIS — Z8249 Family history of ischemic heart disease and other diseases of the circulatory system: Secondary | ICD-10-CM | POA: Diagnosis not present

## 2018-11-25 DIAGNOSIS — Z79899 Other long term (current) drug therapy: Secondary | ICD-10-CM

## 2018-11-25 DIAGNOSIS — D271 Benign neoplasm of left ovary: Secondary | ICD-10-CM | POA: Diagnosis not present

## 2018-11-25 DIAGNOSIS — E785 Hyperlipidemia, unspecified: Secondary | ICD-10-CM | POA: Diagnosis present

## 2018-11-25 DIAGNOSIS — Z90710 Acquired absence of both cervix and uterus: Secondary | ICD-10-CM | POA: Diagnosis not present

## 2018-11-25 DIAGNOSIS — Z833 Family history of diabetes mellitus: Secondary | ICD-10-CM | POA: Diagnosis not present

## 2018-11-25 DIAGNOSIS — F4322 Adjustment disorder with anxiety: Secondary | ICD-10-CM | POA: Diagnosis not present

## 2018-11-25 DIAGNOSIS — I10 Essential (primary) hypertension: Secondary | ICD-10-CM | POA: Diagnosis present

## 2018-11-25 DIAGNOSIS — K529 Noninfective gastroenteritis and colitis, unspecified: Secondary | ICD-10-CM | POA: Diagnosis not present

## 2018-11-25 DIAGNOSIS — E039 Hypothyroidism, unspecified: Secondary | ICD-10-CM | POA: Diagnosis present

## 2018-11-25 DIAGNOSIS — Z8673 Personal history of transient ischemic attack (TIA), and cerebral infarction without residual deficits: Secondary | ICD-10-CM | POA: Diagnosis not present

## 2018-11-25 DIAGNOSIS — I700 Atherosclerosis of aorta: Secondary | ICD-10-CM | POA: Diagnosis not present

## 2018-11-25 DIAGNOSIS — N183 Chronic kidney disease, stage 3 (moderate): Secondary | ICD-10-CM | POA: Diagnosis not present

## 2018-11-25 DIAGNOSIS — M1990 Unspecified osteoarthritis, unspecified site: Secondary | ICD-10-CM | POA: Diagnosis not present

## 2018-11-25 DIAGNOSIS — R0989 Other specified symptoms and signs involving the circulatory and respiratory systems: Secondary | ICD-10-CM | POA: Diagnosis not present

## 2018-11-25 DIAGNOSIS — Z79890 Hormone replacement therapy (postmenopausal): Secondary | ICD-10-CM | POA: Diagnosis not present

## 2018-11-25 DIAGNOSIS — E1122 Type 2 diabetes mellitus with diabetic chronic kidney disease: Secondary | ICD-10-CM | POA: Diagnosis not present

## 2018-11-25 DIAGNOSIS — I4891 Unspecified atrial fibrillation: Secondary | ICD-10-CM | POA: Diagnosis present

## 2018-11-25 DIAGNOSIS — D500 Iron deficiency anemia secondary to blood loss (chronic): Secondary | ICD-10-CM | POA: Diagnosis not present

## 2018-11-25 DIAGNOSIS — Z7982 Long term (current) use of aspirin: Secondary | ICD-10-CM | POA: Diagnosis not present

## 2018-11-25 DIAGNOSIS — K219 Gastro-esophageal reflux disease without esophagitis: Secondary | ICD-10-CM | POA: Diagnosis not present

## 2018-11-25 DIAGNOSIS — I495 Sick sinus syndrome: Secondary | ICD-10-CM | POA: Diagnosis present

## 2018-11-25 DIAGNOSIS — I130 Hypertensive heart and chronic kidney disease with heart failure and stage 1 through stage 4 chronic kidney disease, or unspecified chronic kidney disease: Secondary | ICD-10-CM | POA: Diagnosis not present

## 2018-11-25 DIAGNOSIS — I110 Hypertensive heart disease with heart failure: Secondary | ICD-10-CM | POA: Diagnosis present

## 2018-11-25 DIAGNOSIS — I5030 Unspecified diastolic (congestive) heart failure: Secondary | ICD-10-CM | POA: Diagnosis present

## 2018-11-25 DIAGNOSIS — J810 Acute pulmonary edema: Secondary | ICD-10-CM | POA: Diagnosis not present

## 2018-11-25 DIAGNOSIS — D638 Anemia in other chronic diseases classified elsewhere: Secondary | ICD-10-CM | POA: Diagnosis present

## 2018-11-25 DIAGNOSIS — Z9181 History of falling: Secondary | ICD-10-CM | POA: Diagnosis not present

## 2018-11-25 DIAGNOSIS — K5790 Diverticulosis of intestine, part unspecified, without perforation or abscess without bleeding: Secondary | ICD-10-CM | POA: Diagnosis not present

## 2018-11-25 DIAGNOSIS — Z6837 Body mass index (BMI) 37.0-37.9, adult: Secondary | ICD-10-CM | POA: Diagnosis not present

## 2018-11-25 DIAGNOSIS — Z7901 Long term (current) use of anticoagulants: Secondary | ICD-10-CM

## 2018-11-25 DIAGNOSIS — E119 Type 2 diabetes mellitus without complications: Secondary | ICD-10-CM | POA: Diagnosis present

## 2018-11-25 HISTORY — DX: Unspecified atrial fibrillation: I48.91

## 2018-11-25 LAB — CBC
HCT: 35.4 % — ABNORMAL LOW (ref 36.0–46.0)
Hemoglobin: 11.1 g/dL — ABNORMAL LOW (ref 12.0–15.0)
MCH: 27.9 pg (ref 26.0–34.0)
MCHC: 31.4 g/dL (ref 30.0–36.0)
MCV: 88.9 fL (ref 80.0–100.0)
Platelets: 276 10*3/uL (ref 150–400)
RBC: 3.98 MIL/uL (ref 3.87–5.11)
RDW: 16.5 % — ABNORMAL HIGH (ref 11.5–15.5)
WBC: 9.3 10*3/uL (ref 4.0–10.5)
nRBC: 0 % (ref 0.0–0.2)

## 2018-11-25 LAB — BASIC METABOLIC PANEL
Anion gap: 9 (ref 5–15)
BUN: 16 mg/dL (ref 8–23)
CO2: 24 mmol/L (ref 22–32)
CREATININE: 0.93 mg/dL (ref 0.44–1.00)
Calcium: 8.4 mg/dL — ABNORMAL LOW (ref 8.9–10.3)
Chloride: 107 mmol/L (ref 98–111)
GFR calc Af Amer: >60 mL/min (ref >60)
GFR calc non Af Amer: 56 mL/min — ABNORMAL LOW (ref >60)
Glucose, Bld: 159 mg/dL — ABNORMAL HIGH (ref 70–99)
Potassium: 4.4 mmol/L (ref 3.5–5.1)
Sodium: 140 mmol/L (ref 135–145)

## 2018-11-25 LAB — TROPONIN I: Troponin I: 0.06 ng/mL (ref <0.03)

## 2018-11-25 LAB — BRAIN NATRIURETIC PEPTIDE: B Natriuretic Peptide: 759 pg/mL — ABNORMAL HIGH (ref 0.0–100.0)

## 2018-11-25 MED ORDER — SODIUM CHLORIDE 0.9 % IV BOLUS
500.0000 mL | Freq: Once | INTRAVENOUS | Status: AC
  Administered 2018-11-25: 500 mL via INTRAVENOUS

## 2018-11-25 MED ORDER — DIGOXIN 0.25 MG/ML IJ SOLN
0.1250 mg | Freq: Once | INTRAMUSCULAR | Status: AC
  Administered 2018-11-25: 0.125 mg via INTRAVENOUS
  Filled 2018-11-25: qty 2

## 2018-11-25 MED ORDER — DILTIAZEM HCL 25 MG/5ML IV SOLN
5.0000 mg | Freq: Once | INTRAVENOUS | Status: AC
  Administered 2018-11-25: 5 mg via INTRAVENOUS
  Filled 2018-11-25: qty 5

## 2018-11-25 MED ORDER — AMIODARONE HCL IN DEXTROSE 360-4.14 MG/200ML-% IV SOLN
30.0000 mg/h | INTRAVENOUS | Status: DC
  Administered 2018-11-26: 30 mg/h via INTRAVENOUS
  Filled 2018-11-25: qty 200

## 2018-11-25 MED ORDER — SODIUM CHLORIDE 0.9% FLUSH
3.0000 mL | Freq: Once | INTRAVENOUS | Status: AC
  Administered 2018-11-25: 3 mL via INTRAVENOUS

## 2018-11-25 MED ORDER — DILTIAZEM HCL 100 MG IV SOLR
5.0000 mg/h | INTRAVENOUS | Status: DC
  Administered 2018-11-25: 5 mg/h via INTRAVENOUS
  Filled 2018-11-25: qty 100

## 2018-11-25 MED ORDER — METOPROLOL TARTRATE 5 MG/5ML IV SOLN
5.0000 mg | Freq: Once | INTRAVENOUS | Status: AC
  Administered 2018-11-25: 5 mg via INTRAVENOUS
  Filled 2018-11-25: qty 5

## 2018-11-25 MED ORDER — AMIODARONE HCL IN DEXTROSE 360-4.14 MG/200ML-% IV SOLN
60.0000 mg/h | INTRAVENOUS | Status: DC
  Administered 2018-11-25: 60 mg/h via INTRAVENOUS
  Filled 2018-11-25: qty 200

## 2018-11-25 NOTE — Telephone Encounter (Signed)
Please contact the patient and see if she is going to go to the emergency department.  If she declines please encouraged her to go to the emergency department for evaluation given her elevated heart rate.  Thanks.

## 2018-11-25 NOTE — ED Notes (Signed)
Pt given graham crackers and peanut butter okayed by MD Jannifer Franklin.

## 2018-11-25 NOTE — Patient Outreach (Signed)
[facility]) Care Management  11/25/2018  DEMEISHA GERAGHTY December 08, 1932 [id number]   Referral Date: 11/13/2018 Referral Source: [facility] Referral Reason: Member currently at [facility] skilled nursing facility, pending discharge on 2/26 with home health services. Member has a history of Congestive Heart Failure and diabetes. Member is a resident of [facility] / Assisted Living facility.    Outreach Attempt: no answer.  HIPAA compliant voice message left.    Plan: RN CM will wait return call.  If no return call will close case.    Jone Baseman, RN, MSN [place] [facility] Care Management Coordinator Direct Line [phone] Cell [phone] Toll Free: [phone]  Fax: [phone]

## 2018-11-25 NOTE — Telephone Encounter (Signed)
Called pt to see if she was going to go to the ED. Pt stated that with the insurance she has she gets a free home evaluation from a medical professional so she stated that she is waiting on them to get there and they should be there "anytime now". I asked the pt who this medical professional was and she stated that it was the an EMT that would be coming out to her house. The pt was encouraged to go to the ED to be evaluated for the increased heart rate and the fact that she has a history of A. Fib. The the stated that she was going to wait until the "medical Professional" arrived and see what they say. I again explained to the pt the importance of her being evaluated at the ED. I told the pt that I would give her a call back to see if they had arrived yet. Called back at 5:33pm and the pt stated that they still had not arrived and I told the pt that I would be giving her a call again in 15 minutes. Pt gave a verbal understanding.

## 2018-11-25 NOTE — ED Notes (Signed)
Called pharmacy to get them to send up Cardizem due to not being in pyxis.

## 2018-11-25 NOTE — ED Notes (Signed)
Date and time results received: 11/25/18 2059  Test: Troponin Critical Value: 0.06  Name of Provider Notified: Dr Cherylann Banas  Orders Received? Or Actions Taken?: Acknowledged

## 2018-11-25 NOTE — Telephone Encounter (Signed)
Called and left vm for [place] @ [employer] and gave verbal orders for OT as requested.

## 2018-11-25 NOTE — Telephone Encounter (Signed)
Noted. It appears the patient is in the ED now.

## 2018-11-25 NOTE — ED Triage Notes (Addendum)
Patient reports she felt her heart racing this morning and called her service "[facility]" this afternoon who came and told her she needed to come to ER with rapid A-fib. Denies CP, SOB or any pain. Reports being diagnosed with A-fib X 2 weeks ago

## 2018-11-25 NOTE — ED Notes (Addendum)
ED TO INPATIENT HANDOFF REPORT  ED Nurse Name and Phone #: Landry Mellow [phone]  S Name/Age/Gender Diane Mullins 85 y.o. female Room/Bed: ED01A/ED01A  Code Status   Code Status: Prior  Home/SNF/Other Home Patient oriented to: self, place, time and situation Is this baseline? Yes   Triage Complete: Triage complete  Chief Complaint AFib  Triage Note Patient reports she felt her heart racing this morning and called her service "[facility]" this afternoon who came and told her she needed to come to ER with rapid A-fib. Denies CP, SOB or any pain. Reports being diagnosed with A-fib X 2 weeks ago   Allergies Allergies  Allergen Reactions  . Oxycodone     Other reaction(s): Confusion Patient's daughter reported confusion/sensitivity to oxycodone.     Level of Care/Admitting Diagnosis ED Disposition    ED Disposition Condition [facility] Area: [place] [[id number]]  Level of Care: Telemetry [5]  Diagnosis: Atrial flutter with rapid ventricular response [facility]) [[id number]]  Admitting Physician: Lance Coon [[id number]]  Attending Physician: Lance Coon [[id number]]  Bed request comments: 2a  PT Class (Do Not Modify): Observation [104]  PT Acc Code (Do Not Modify): Observation [10022]       B Medical/Surgery History Past Medical History:  Diagnosis Date  . Atrial fibrillation ([place])   . Benign paroxysmal positional vertigo 07/17/2014  . Chronic diastolic CHF (congestive heart failure) ([place])    a. echo 10/19: EF of 60-65%, no RWMA, Gr2DD, calcified mitral annulus with mild MR, mildly to moderately dilated left atrium, moderate TR, PASP 52 mmHg  . Diabetes mellitus   . H/O: rheumatic fever   . Hyperlipidemia   . Hypertension   . Pulmonary hypertension ([place])   . Stroke [facility])    TIA Jan. 1st  . Teratoma of left ovary   . Thyroid disease    Past Surgical History:  Procedure Laterality Date  . ABDOMINAL HYSTERECTOMY  1973   menorrhagia  .  CARDIOVERSION N/A 10/22/2018   Procedure: CARDIOVERSION;  Surgeon: Nelva Bush, MD;  Location: [facility] ORS;  Service: Cardiovascular;  Laterality: N/A;  . [place]  . ORIF ANKLE FRACTURE Right 10/29/2015   Procedure: OPEN REDUCTION INTERNAL FIXATION (ORIF) ANKLE FRACTURE;  Surgeon: [place], MD;  Location: [facility] ORS;  Service: Orthopedics;  Laterality: Right;  . TEE WITHOUT CARDIOVERSION N/A 10/22/2018   Procedure: TRANSESOPHAGEAL ECHOCARDIOGRAM (TEE);  Surgeon: Nelva Bush, MD;  Location: [facility] ORS;  Service: Cardiovascular;  Laterality: N/A;  . VAGINAL DELIVERY     x4     A IV Location/Drains/Wounds Patient Lines/Drains/Airways Status   Active Line/Drains/Airways    Name:   Placement date:   Placement time:   Site:   Days:   Peripheral IV 10/17/18 Right Antecubital   10/17/18    1445    Antecubital   39   Peripheral IV 10/21/18 Posterior;Right Hand   10/21/18    0000    Hand   35   Peripheral IV 11/25/18 Right Antecubital   11/25/18    1858    Antecubital   less than 1   External Urinary Catheter   10/19/18    0700    -   37          Intake/Output Last 24 hours  Intake/Output Summary (Last 24 hours) at 11/25/2018 2331 Last data filed at 11/25/2018 2109 Gross per 24 hour  Intake 799.2 ml  Output -  Net 799.2 ml  Labs/Imaging Results for orders placed or performed during the hospital encounter of 11/25/18 (from the past 48 hour(s))  Basic metabolic panel     Status: Abnormal   Collection Time: 11/25/18  7:04 PM  Result Value Ref Range   Sodium 140 135 - 145 mmol/L   Potassium 4.4 3.5 - 5.1 mmol/L   Chloride 107 98 - 111 mmol/L   CO2 24 22 - 32 mmol/L   Glucose, Bld 159 (H) 70 - 99 mg/dL   BUN 16 8 - 23 mg/dL   Creatinine, Ser 0.93 0.44 - 1.00 mg/dL   Calcium 8.4 (L) 8.9 - 10.3 mg/dL   GFR calc non Af Amer 56 (L) >60 mL/min   GFR calc Af Amer >60 >60 mL/min   Anion gap 9 5 - 15    Comment: Performed at [facility], [address]  [place], [place] [address]  CBC     Status: Abnormal   Collection Time: 11/25/18  7:04 PM  Result Value Ref Range   WBC 9.3 4.0 - 10.5 K/uL   RBC 3.98 3.87 - 5.11 MIL/uL   Hemoglobin 11.1 (L) 12.0 - 15.0 g/dL   HCT 35.4 (L) 36.0 - 46.0 %   MCV 88.9 80.0 - 100.0 fL   MCH 27.9 26.0 - 34.0 pg   MCHC 31.4 30.0 - 36.0 g/dL   RDW 16.5 (H) 11.5 - 15.5 %   Platelets 276 150 - 400 K/uL   nRBC 0.0 0.0 - 0.2 %    Comment: Performed at [facility], [address] [place], [place] [address]  Troponin I - ONCE - STAT     Status: Abnormal   Collection Time: 11/25/18  7:04 PM  Result Value Ref Range   Troponin I 0.06 (HH) <0.03 ng/mL    Comment: CRITICAL RESULT CALLED TO, READ BACK BY AND VERIFIED WITH COLE Gabrial Poppell AT  2056 11/25/2018 SMA Performed at [facility], [address], [place], [place] [address]   Brain natriuretic peptide     Status: Abnormal   Collection Time: 11/25/18  7:04 PM  Result Value Ref Range   B Natriuretic Peptide 759.0 (H) 0.0 - 100.0 pg/mL    Comment: Performed at [facility], [address] [place],  [address]   Dg Chest 2 View  Result Date: 11/25/2018 CLINICAL DATA:  Atrial fibrillation EXAM: CHEST - 2 VIEW COMPARISON:  10/26/2018 FINDINGS: Chronic cardiomegaly. Interstitial coarsening compatible with vascular congestion. No Kerley lines or air bronchogram. No effusion or pneumothorax. Hiatal hernia. IMPRESSION: Cardiomegaly and vascular congestion. Electronically Signed   By: Monte Fantasia M.D.   On: 11/25/2018 19:00    Pending Labs Unresulted Labs (From admission, onward)    Start     Ordered   11/25/18 1844  Urinalysis, Complete w Microscopic  ONCE - STAT,   STAT     11/25/18 1844   Signed and Held  Basic metabolic panel  Tomorrow morning,   R     Signed and Held   Signed and Held  CBC  Tomorrow morning,   R     Signed and Held          Vitals/Pain Today's Vitals   11/25/18 2030 11/25/18 2100 11/25/18 2200  11/25/18 2230  BP: 111/82 (!) 126/92 109/81 112/80  Pulse: (!) 146 (!) 145 (!) 146 (!) 143  Resp: (!) 23 (!) 23 (!) 23 16  Temp:      TempSrc:  SpO2: 97% 96% 93% 96%  Weight:      Height:      PainSc:        Isolation Precautions No active isolations  Medications Medications  diltiazem (CARDIZEM) 100 mg in dextrose 5 % 100 mL (1 mg/mL) infusion (0 mg/hr Intravenous Stopped 11/25/18 2147)  amiodarone (NEXTERONE PREMIX) 360-4.14 MG/200ML-% (1.8 mg/mL) IV infusion (60 mg/hr Intravenous New Bag/Given 11/25/18 2147)    Followed by  amiodarone (NEXTERONE PREMIX) 360-4.14 MG/200ML-% (1.8 mg/mL) IV infusion (has no administration in time range)  sodium chloride flush (NS) 0.9 % injection 3 mL (3 mLs Intravenous Given 11/25/18 1914)  metoprolol tartrate (LOPRESSOR) injection 5 mg (5 mg Intravenous Given 11/25/18 1900)  diltiazem (CARDIZEM) injection 5 mg (5 mg Intravenous Given 11/25/18 1936)  sodium chloride 0.9 % bolus 500 mL (0 mLs Intravenous Stopped 11/25/18 2109)  digoxin (LANOXIN) 0.25 MG/ML injection 0.125 mg (0.125 mg Intravenous Given 11/25/18 2321)    Mobility walks with walker and person assist Low fall risk   Focused Assessments Cardiac Assessment Handoff:  Cardiac Rhythm: Atrial fibrillation Lab Results  Component Value Date   TROPONINI 0.06 ([place]) 11/25/2018   No results found for: DDIMER Does the Patient currently have chest pain? No     R Recommendations: See Admitting Provider Note  Report given to:   Additional Notes: Pt started on IV Cardizem with no change, pt on Amiodorone IV 60 mg/hr and was given .125 mg digoxin @ 2321

## 2018-11-25 NOTE — H&P (Signed)
[place] [facility] [place] NAME: Diane Mullins    MR#:  [id number]  DATE OF BIRTH:  1932-09-30  DATE OF ADMISSION:  11/25/2018  PRIMARY CARE PHYSICIAN: [name], MD   REQUESTING/REFERRING PHYSICIAN: Cherylann Banas, MD  CHIEF COMPLAINT:   Chief Complaint  Patient presents with  . Atrial Fibrillation    HISTORY OF PRESENT ILLNESS:  Diane Mullins  is a 85 y.o. female who presents with chief complaint as above.  Patient presents to the ED with a complaint of palpitations.  She has a history of A. fib/a flutter.  She states that she feels palpitations when her heart rate is very fast.  Today she presented with a heart rate in the 140s, and a flutter with rapid ventricular response.  She was treated initially with blocking agents in the ED without much effect.  She was then placed on Cardizem drip, which also did not seem to have much effect.  This was changed to an amiodarone drip and hospitalist were called for admission.  PAST MEDICAL HISTORY:   Past Medical History:  Diagnosis Date  . Atrial fibrillation ([place])   . Benign paroxysmal positional vertigo 07/17/2014  . Chronic diastolic CHF (congestive heart failure) ([place])    a. echo 10/19: EF of 60-65%, no RWMA, Gr2DD, calcified mitral annulus with mild MR, mildly to moderately dilated left atrium, moderate TR, PASP 52 mmHg  . Diabetes mellitus   . H/O: rheumatic fever   . Hyperlipidemia   . Hypertension   . Pulmonary hypertension ([place])   . Stroke [facility])    TIA Jan. 1st  . Teratoma of left ovary   . Thyroid disease      PAST SURGICAL HISTORY:   Past Surgical History:  Procedure Laterality Date  . ABDOMINAL HYSTERECTOMY  1973   menorrhagia  . CARDIOVERSION N/A 10/22/2018   Procedure: CARDIOVERSION;  Surgeon: Nelva Bush, MD;  Location: [facility] ORS;  Service: Cardiovascular;  Laterality: N/A;  . [place]  . ORIF ANKLE FRACTURE Right 10/29/2015   Procedure: OPEN REDUCTION  INTERNAL FIXATION (ORIF) ANKLE FRACTURE;  Surgeon: [place], MD;  Location: [facility] ORS;  Service: Orthopedics;  Laterality: Right;  . TEE WITHOUT CARDIOVERSION N/A 10/22/2018   Procedure: TRANSESOPHAGEAL ECHOCARDIOGRAM (TEE);  Surgeon: Nelva Bush, MD;  Location: [facility] ORS;  Service: Cardiovascular;  Laterality: N/A;  . VAGINAL DELIVERY     x4     SOCIAL HISTORY:   Social History   Tobacco Use  . Smoking status: Never Smoker  . Smokeless tobacco: Never Used  Substance Use Topics  . Alcohol use: No     FAMILY HISTORY:   Family History  Problem Relation Age of Onset  . Heart disease Mother 28  . Heart attack Mother 67  . Cancer Father        colon  . Diabetes Sister   . Hypertension Sister   . Cancer Brother        kidney  . Diabetes Brother   . Cancer Daughter        ovarian  . Non-Hodgkin's lymphoma Son      DRUG ALLERGIES:   Allergies  Allergen Reactions  . Oxycodone     Other reaction(s): Confusion Patient's daughter reported confusion/sensitivity to oxycodone.     MEDICATIONS AT HOME:   Prior to Admission medications   Medication Sig Start Date End Date Taking? Authorizing Provider  acetaminophen (TYLENOL) 325 MG tablet Take 650 mg by mouth  every 6 (six) hours as needed.   Yes [provider]  apixaban (ELIQUIS) 5 MG TABS tablet Take 1 tablet (5 mg total) by mouth 2 (two) times daily. 10/28/18  Yes Henreitta Leber, MD  aspirin EC 81 MG tablet Take 81 mg by mouth daily.   Yes [provider]  ferrous sulfate 325 (65 FE) MG EC tablet Take 1 tablet (325 mg total) by mouth daily with breakfast. 08/30/18  Yes Guse, Jacquelynn Cree, FNP  furosemide (LASIX) 20 MG tablet Take 1 tablet (20 mg total) by mouth daily. 07/10/18  Yes Gladstone Lighter, MD  glucose blood (ONE TOUCH ULTRA TEST) test strip Use as instructed to check Blood sugar three times a day.  E11.65 Dispense patient choice. 07/30/18  Yes Guse, Jacquelynn Cree, FNP  ibuprofen  (ADVIL,MOTRIN) 200 MG tablet Take 200 mg by mouth every 6 (six) hours as needed.   Yes [provider]  Insulin Glargine (LANTUS SOLOSTAR) 100 UNIT/ML Solostar Pen INJECT  17 UNITS SUBCUTANEOUSLY IN THE MORNING 08/27/18  Yes Guse, Jacquelynn Cree, FNP  Insulin Pen Needle (PEN NEEDLES) 32G X 4 MM MISC Inject 1 Dose into the skin daily. 12/19/16  Yes [name], MD  levothyroxine (SYNTHROID, LEVOTHROID) 75 MCG tablet Take 1 tablet (75 mcg total) by mouth daily. 04/09/18  Yes [name], MD  losartan (COZAAR) 50 MG tablet Take 50 mg by mouth daily.   Yes [provider]  metFORMIN (GLUCOPHAGE) 1000 MG tablet Take 1 tablet (1,000 mg total) by mouth 2 (two) times daily with a meal. TAKE 1 TABLET TWICE DAILY  WITH  A  MEAL 04/09/18  Yes [name], MD  metoprolol succinate (TOPROL-XL) 25 MG 24 hr tablet Take 1 tablet (25 mg total) by mouth daily. 08/27/18  Yes Guse, Jacquelynn Cree, FNP  omeprazole (PRILOSEC) 20 MG capsule Take 1 capsule (20 mg total) by mouth daily. 04/09/18  Yes [name], MD  polyethylene glycol [facility] / GLYCOLAX) packet Take 17 g by mouth daily as needed.    Yes [provider]  potassium chloride (K-DUR) 10 MEQ tablet TAKE 1 TABLET BY MOUTH EVERY DAY 09/12/18  Yes [name], MD  simvastatin (ZOCOR) 40 MG tablet Take 1 tablet (40 mg total) by mouth every evening. 04/09/18  Yes [name], MD    REVIEW OF SYSTEMS:  Review of Systems  Constitutional: Negative for chills, fever, malaise/fatigue and weight loss.  HENT: Negative for ear pain, hearing loss and tinnitus.   Eyes: Negative for blurred vision, double vision, pain and redness.  Respiratory: Negative for cough, hemoptysis and shortness of breath.   Cardiovascular: Positive for palpitations. Negative for chest pain, orthopnea and leg swelling.  Gastrointestinal: Negative for abdominal pain, constipation, diarrhea, nausea and vomiting.  Genitourinary: Negative for  dysuria, frequency and hematuria.  Musculoskeletal: Negative for back pain, joint pain and neck pain.  Skin:       No acne, rash, or lesions  Neurological: Negative for dizziness, tremors, focal weakness and weakness.  Endo/Heme/Allergies: Negative for polydipsia. Does not bruise/bleed easily.  Psychiatric/Behavioral: Negative for depression. The patient is not nervous/anxious and does not have insomnia.      VITAL SIGNS:   Vitals:   11/25/18 1955 11/25/18 2030 11/25/18 2100 11/25/18 2200  BP: 91/76 111/82 (!) 126/92 109/81  Pulse: (!) 145 (!) 146 (!) 145 (!) 146  Resp: (!) 29 (!) 23 (!) 23 (!) 23  Temp:  TempSrc:      SpO2: 95% 97% 96% 93%  Weight:      Height:       Wt Readings from Last 3 Encounters:  11/25/18 91.6 kg  10/28/18 92.6 kg  10/02/18 93.8 kg    PHYSICAL EXAMINATION:  Physical Exam  Vitals reviewed. Constitutional: She is oriented to person, place, and time. She appears well-developed and well-nourished. No distress.  HENT:  Head: Normocephalic and atraumatic.  Mouth/Throat: Oropharynx is clear and moist.  Eyes: Pupils are equal, round, and reactive to light. Conjunctivae and EOM are normal. No scleral icterus.  Neck: Normal range of motion. Neck supple. No JVD present. No thyromegaly present.  Cardiovascular: Intact distal pulses. Exam reveals no gallop and no friction rub.  No murmur heard. Tachycardic, a flutter  Respiratory: Effort normal and breath sounds normal. No respiratory distress. She has no wheezes. She has no rales.  GI: Soft. Bowel sounds are normal. She exhibits no distension. There is no abdominal tenderness.  Musculoskeletal: Normal range of motion.        General: No edema.     Comments: No arthritis, no gout  Lymphadenopathy:    She has no cervical adenopathy.  Neurological: She is alert and oriented to person, place, and time. No cranial nerve deficit.  No dysarthria, no aphasia  Skin: Skin is warm and dry. No rash noted. No  erythema.  Psychiatric: She has a normal mood and affect. Her behavior is normal. Judgment and thought content normal.    LABORATORY PANEL:   CBC Recent Labs  Lab 11/25/18 1904  WBC 9.3  HGB 11.1*  HCT 35.4*  PLT 276   ------------------------------------------------------------------------------------------------------------------  Chemistries  Recent Labs  Lab 11/25/18 1904  NA 140  K 4.4  CL 107  CO2 24  GLUCOSE 159*  BUN 16  CREATININE 0.93  CALCIUM 8.4*   ------------------------------------------------------------------------------------------------------------------  Cardiac Enzymes Recent Labs  Lab 11/25/18 1904  TROPONINI 0.06*   ------------------------------------------------------------------------------------------------------------------  RADIOLOGY:  Dg Chest 2 View  Result Date: 11/25/2018 CLINICAL DATA:  Atrial fibrillation EXAM: CHEST - 2 VIEW COMPARISON:  10/26/2018 FINDINGS: Chronic cardiomegaly. Interstitial coarsening compatible with vascular congestion. No Kerley lines or air bronchogram. No effusion or pneumothorax. Hiatal hernia. IMPRESSION: Cardiomegaly and vascular congestion. Electronically Signed   By: Monte Fantasia M.D.   On: 11/25/2018 19:00    EKG:   Orders placed or performed during the hospital encounter of 11/25/18  . EKG 12-Lead  . EKG 12-Lead  . ED EKG  . ED EKG    IMPRESSION AND PLAN:  Principal Problem:   Atrial flutter with rapid ventricular response (HCC) -patient was given blocking agents IV push, then put on a Cardizem drip, then changed in amiodarone drip.  Her heart rate remains in rapid ventricular response.  We will utilize some additional digoxin to see if this can help slow her rate.  We will get a cardiology consult Active Problems:   Hypertension -blood pressure is controlled right now on above listed IV agents.  Hold other antihypertensives for now   Diabetes ([place]) -sliding scale insulin coverage    (HFpEF) heart failure with preserved ejection fraction ([place]) -continue home meds, cardiology consult as above   Hyperlipidemia -home dose antilipid   Hypothyroidism -home dose thyroid replacement  Chart review performed and case discussed with ED provider. Labs, imaging and/or ECG reviewed by provider and discussed with patient/family. Management plans discussed with the patient and/or family.  DVT PROPHYLAXIS: Systemic anticoagulation  GI PROPHYLAXIS:  PPI   ADMISSION STATUS: Observation  CODE STATUS: Full Code Status History    Date Active Date Inactive Code Status Order ID Comments User Context   10/17/2018 1654 10/28/2018 2002 Full Code [id number]  Gladstone Lighter, MD ED   07/05/2018 2206 07/09/2018 2156 Full Code [id number]  Gorden Harms, MD Inpatient   10/28/2015 1501 10/29/2015 1718 Full Code [id number]  Henreitta Leber, MD Inpatient   09/18/2015 1639 09/20/2015 2243 Full Code [id number]  Gladstone Lighter, MD Inpatient      TOTAL TIME TAKING CARE OF THIS PATIENT: 40 minutes.   Ethlyn Daniels 11/25/2018, 10:53 PM  [facility]  Office  [phone]  CC: Primary care physician; [name], MD  Note:  This document was prepared using Dragon voice recognition software and may include unintentional dictation errors.

## 2018-11-25 NOTE — ED Provider Notes (Signed)
[facility] Emergency Department Provider Note ____________________________________________   First MD Initiated Contact with Patient 11/25/18 1841     (approximate)  I have reviewed the triage vital signs and the nursing notes.   HISTORY  Chief Complaint Atrial Fibrillation    HPI Diane Mullins is a 85 y.o. female with PMH as noted below including history of atrial fibrillation who presents with palpitations since 530 this morning, persistent course, associated with some generalized weakness but not associated with any shortness of breath, lightheadedness, or chest pain.  The patient states that she did take her normal metoprolol 25 mg this morning.  Past Medical History:  Diagnosis Date  . Atrial fibrillation ([place])   . Benign paroxysmal positional vertigo 07/17/2014  . Chronic diastolic CHF (congestive heart failure) ([place])    a. echo 10/19: EF of 60-65%, no RWMA, Gr2DD, calcified mitral annulus with mild MR, mildly to moderately dilated left atrium, moderate TR, PASP 52 mmHg  . Diabetes mellitus   . H/O: rheumatic fever   . Hyperlipidemia   . Hypertension   . Pulmonary hypertension ([place])   . Stroke [facility])    TIA Jan. 1st  . Teratoma of left ovary   . Thyroid disease     Patient Active Problem List   Diagnosis Date Noted  . Atrial flutter ([place]) 10/17/2018  . 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 AKI (acute kidney injury) ([place])   . Acute on chronic diastolic heart failure ([place])   . Respiratory failure ([place]) 07/05/2018  . Atrial flutter with rapid ventricular response ([place]) 11/16/2017  . Coronary artery calcification seen on CAT scan 11/16/2017  . Morbid obesity ([place]) 10/30/2017  . (HFpEF) heart failure with preserved ejection fraction ([place]) 09/28/2017  . Family history of colon cancer 04/17/2017  . Closed right ankle fracture 10/28/2015  . Fatigue 10/18/2015  . TIA (transient ischemic attack) 09/18/2015  . Orthostatic hypotension 08/02/2015    . Adjustment disorder with anxious mood 03/30/2015  . Postmenopausal estrogen deficiency 06/03/2014  . DNR (do not resuscitate) discussion 03/04/2014  . Gait disturbance 05/07/2013  . Diabetes ([place]) 04/21/2013  . Shortness of breath 12/09/2012  . Hearing loss 12/09/2012  . Anxiety 01/31/2012  . Hypertension 12/22/2011  . Hyperlipidemia 12/22/2011  . Hypothyroidism 12/22/2011    Past Surgical History:  Procedure Laterality Date  . ABDOMINAL HYSTERECTOMY  1973   menorrhagia  . CARDIOVERSION N/A 10/22/2018   Procedure: CARDIOVERSION;  Surgeon: Nelva Bush, MD;  Location: [facility] ORS;  Service: Cardiovascular;  Laterality: N/A;  . [place]  . ORIF ANKLE FRACTURE Right 10/29/2015   Procedure: OPEN REDUCTION INTERNAL FIXATION (ORIF) ANKLE FRACTURE;  Surgeon: Thornton Park, MD;  Location: [facility] ORS;  Service: Orthopedics;  Laterality: Right;  . TEE WITHOUT CARDIOVERSION N/A 10/22/2018   Procedure: TRANSESOPHAGEAL ECHOCARDIOGRAM (TEE);  Surgeon: Nelva Bush, MD;  Location: [facility] ORS;  Service: Cardiovascular;  Laterality: N/A;  . VAGINAL DELIVERY     x4    Prior to Admission medications   Medication Sig Start Date End Date Taking? Authorizing Provider  acetaminophen (TYLENOL) 325 MG tablet Take 650 mg by mouth every 6 (six) hours as needed.   Yes [provider]  apixaban (ELIQUIS) 5 MG TABS tablet Take 1 tablet (5 mg total) by mouth 2 (two) times daily. 10/28/18  Yes Henreitta Leber, MD  aspirin EC 81 MG tablet Take 81 mg by mouth daily.   Yes [provider]  ferrous sulfate 325 (65  FE) MG EC tablet Take 1 tablet (325 mg total) by mouth daily with breakfast. 08/30/18  Yes Guse, Jacquelynn Cree, FNP  furosemide (LASIX) 20 MG tablet Take 1 tablet (20 mg total) by mouth daily. 07/10/18  Yes Gladstone Lighter, MD  glucose blood (ONE TOUCH ULTRA TEST) test strip Use as instructed to check Blood sugar three times a day.  E11.65 Dispense patient choice. 07/30/18  Yes  Guse, Jacquelynn Cree, FNP  ibuprofen (ADVIL,MOTRIN) 200 MG tablet Take 200 mg by mouth every 6 (six) hours as needed.   Yes [provider]  Insulin Glargine (LANTUS SOLOSTAR) 100 UNIT/ML Solostar Pen INJECT  17 UNITS SUBCUTANEOUSLY IN THE MORNING 08/27/18  Yes Guse, Jacquelynn Cree, FNP  Insulin Pen Needle (PEN NEEDLES) 32G X 4 MM MISC Inject 1 Dose into the skin daily. 12/19/16  Yes [name], MD  levothyroxine (SYNTHROID, LEVOTHROID) 75 MCG tablet Take 1 tablet (75 mcg total) by mouth daily. 04/09/18  Yes [name], MD  losartan (COZAAR) 50 MG tablet Take 50 mg by mouth daily.   Yes [provider]  metFORMIN (GLUCOPHAGE) 1000 MG tablet Take 1 tablet (1,000 mg total) by mouth 2 (two) times daily with a meal. TAKE 1 TABLET TWICE DAILY  WITH  A  MEAL 04/09/18  Yes [name], MD  metoprolol succinate (TOPROL-XL) 25 MG 24 hr tablet Take 1 tablet (25 mg total) by mouth daily. 08/27/18  Yes Guse, Jacquelynn Cree, FNP  omeprazole (PRILOSEC) 20 MG capsule Take 1 capsule (20 mg total) by mouth daily. 04/09/18  Yes [name], MD  polyethylene glycol [facility] / GLYCOLAX) packet Take 17 g by mouth daily as needed.    Yes [provider]  potassium chloride (K-DUR) 10 MEQ tablet TAKE 1 TABLET BY MOUTH EVERY DAY 09/12/18  Yes [name], MD  simvastatin (ZOCOR) 40 MG tablet Take 1 tablet (40 mg total) by mouth every evening. 04/09/18  Yes [name], MD    Allergies Oxycodone  Family History  Problem Relation Age of Onset  . Heart disease Mother 17  . Heart attack Mother 74  . Cancer Father        colon  . Diabetes Sister   . Hypertension Sister   . Cancer Brother        kidney  . Diabetes Brother   . Cancer Daughter        ovarian  . Non-Hodgkin's lymphoma Son     Social History Social History   Tobacco Use  . Smoking status: Never Smoker  . Smokeless tobacco: Never Used  Substance Use Topics  . Alcohol use: No  . Drug use: No     Review of Systems  Constitutional: No fever.  Positive for weakness. Eyes: No visual changes. ENT: No sore throat. Cardiovascular: Denies chest pain. Respiratory: Denies shortness of breath. Gastrointestinal: No vomiting. Genitourinary: Negative for flank pain.  Musculoskeletal: Negative for back pain. Skin: Negative for rash. Neurological: Negative for headache.   ____________________________________________   PHYSICAL EXAM:  VITAL SIGNS: ED Triage Vitals  Enc Vitals Group     BP 11/25/18 1831 121/83     Pulse Rate 11/25/18 1831 (!) 144     Resp 11/25/18 1831 16     Temp 11/25/18 1831 98.2 F (36.8 C)     Temp Source 11/25/18 1831 Oral     SpO2 11/25/18 1831 96 %     Weight 11/25/18 1832 202 lb (91.6 kg)  Height 11/25/18 1832 5\' 2"  (1.575 m)     Head Circumference --      Peak Flow --      Pain Score 11/25/18 1832 0     Pain Loc --      Pain Edu? --      Excl. in [place]? --     Constitutional: Alert and oriented. Well appearing for age and in no acute distress. Eyes: Conjunctivae are normal.  Head: Atraumatic. Nose: No congestion/rhinnorhea. Mouth/Throat: Mucous membranes are moist.   Neck: Normal range of motion.  Cardiovascular: Tachycardic, regular rhythm. Grossly normal heart sounds.  Good peripheral circulation. Respiratory: Normal respiratory effort.  No retractions. Lungs CTAB. Gastrointestinal: No distention.  Musculoskeletal: Trace bilateral lower extremity edema.  Extremities warm and well perfused.  Neurologic:  Normal speech and language. No gross focal neurologic deficits are appreciated.  Skin:  Skin is warm and dry. No rash noted. Psychiatric: Mood and affect are normal. Speech and behavior are normal.  ____________________________________________   LABS (all labs ordered are listed, but only abnormal results are displayed)  Labs Reviewed  BASIC METABOLIC PANEL - Abnormal; Notable for the following components:      Result Value    Glucose, Bld 159 (*)    Calcium 8.4 (*)    GFR calc non Af Amer 56 (*)    All other components within normal limits  CBC - Abnormal; Notable for the following components:   Hemoglobin 11.1 (*)    HCT 35.4 (*)    RDW 16.5 (*)    All other components within normal limits  TROPONIN I - Abnormal; Notable for the following components:   Troponin I 0.06 (*)    All other components within normal limits  BRAIN NATRIURETIC PEPTIDE - Abnormal; Notable for the following components:   B Natriuretic Peptide 759.0 (*)    All other components within normal limits  URINALYSIS, COMPLETE (UACMP) WITH MICROSCOPIC   ____________________________________________  EKG  ED ECG REPORT I, Arta Silence, the attending physician, personally viewed and interpreted this ECG.  Date: 11/25/2018 EKG Time: 1832 Rate: 148 Rhythm: Atrial flutter with 2:1 conduction QRS Axis: normal Intervals: normal ST/T Wave abnormalities: Nonspecific ST abnormalities, likely rate related Narrative Interpretation: Atrial flutter  ____________________________________________  RADIOLOGY  CXR: Cardiomegaly with vascular congestion  ____________________________________________   PROCEDURES  Procedure(s) performed: No  Procedures  Critical Care performed: Yes  CRITICAL CARE Performed by: Arta Silence   Total critical care time: 60 minutes  Critical care time was exclusive of separately billable procedures and treating other patients.  Critical care was necessary to treat or prevent imminent or life-threatening deterioration.  Critical care was time spent personally by me on the following activities: development of treatment plan with patient and/or surrogate as well as nursing, discussions with consultants, evaluation of patient's response to treatment, examination of patient, obtaining history from patient or surrogate, ordering and performing treatments and interventions, ordering and review of  laboratory studies, ordering and review of radiographic studies, pulse oximetry and re-evaluation of patient's condition. ____________________________________________   INITIAL IMPRESSION / ASSESSMENT AND PLAN / ED COURSE  Pertinent labs & imaging results that were available during my care of the patient were reviewed by me and considered in my medical decision making (see chart for details).  85 year old female with PMH as noted above including recent admission and diagnosis of atrial fibrillation presents with palpitations since 530 this morning with generalized weakness but no other specific symptoms.  I reviewed the past medical  records in [place].  The patient was admitted last month with rapid atrial fibrillation/flutter, and eventually was electrically cardioverted.  On exam today the patient is tachycardic to the 140s.  Her other vital signs are normal.  She is relatively well-appearing for her age and the remainder of the exam is unremarkable.   EKG shows atrial flutter.  This is consistent with the patient symptoms.  Since the patient is already on metoprolol, I will give IV metoprolol initially and consider Cardizem or other agents if it is refractory.  ----------------------------------------- 11:20 PM on 11/25/2018 -----------------------------------------  The atrial flutter was refractory to metoprolol boluses as well as Cardizem bolus and infusion.  The patient's blood pressure lowered to the 50N systolic although she had no lightheadedness or other acute symptoms and continue to appear comfortable. I consulted Dr. Acie Fredrickson from cardiology to discuss further options.  He agreed with my plan to try amiodarone.  An amiodarone infusion was initiated although the patient continued to be tachycardic.  Since she is otherwise very stable, there is no indication for emergent cardioversion in the ED.  The patient will require admission for further monitoring and treatment.  I signed her out  to the hospitalist Dr. Jannifer Franklin. ____________________________________________   FINAL CLINICAL IMPRESSION(S) / ED DIAGNOSES  Final diagnoses:  Atrial flutter, unspecified type ([place])      NEW MEDICATIONS STARTED DURING THIS VISIT:  New Prescriptions   No medications on file     Note:  This document was prepared using Dragon voice recognition software and may include unintentional dictation errors.    Arta Silence, MD 11/26/18 0005

## 2018-11-25 NOTE — Telephone Encounter (Signed)
rec'd call from [facility] RN with [facility].  Reported she arrived at pt's home about 25 minutes ago, and pt. Immediately requested to have her Pulse Ox machine to check her heart rate.  Per [facility] RN, the Pulse oximeter machine noted the heart rate was about 150.  [place] RN checked pt's heart rate manually, and noted 140 and irregular.  Stated BP 124/72, 02 Sat 95 % on room air.  Stated the pt. denied any chest discomfort.  Reported feeling light headed off and on all day.  Stated she has felt the fast, irregular heart beat off and on since she came home from Rehab 2 weeks ago.  Stated she doesn't get short of breath, if she breaths like she was instructed at Rehab.  during the Triage call/ assessment questions, the [facility] RN stated the pt's heart rate stayed in the 150"s range.  Pt. reported she has been awakening at night, feeling the fast heart beat, and also when she walks to the dining room.  Advised to have EMS called to evaluate the pt. At this time.  Per [facility] RN, the pt. Is reluctant to have EMS come to her home.  The [facility] RN stated she will discuss with pt. The need to have evaluation of her heart rhythm.   will send message to Dr. Caryl Bis, to make him aware.       Reason for Disposition . [1] Heart beating very rapidly (e.g., > 140 / minute) AND [2] present now  (Exception: during exercise)  Answer Assessment - Initial Assessment Questions 1. DESCRIPTION: "Please describe your heart rate or heart beat that you are having" (e.g., fast/slow, regular/irregular, skipped or extra beats, "palpitations")     Pt. Stated she doesn't feel her heart racing right now 2. ONSET: "When did it start?" (Minutes, hours or days)      Off and on all day 3. DURATION: "How long does it last" (e.g., seconds, minutes, hours)     Unknown; sometimes it wakes her up at night; sometimes it happens when she walks to the [place] 4. PATTERN "Does it come and go, or has it been constant since it started?"  "Does it get worse with  exertion?"   "Are you feeling it now?"     Its been off and on since she got home from [facility] 2 weeks ago. 5. TAP: "Using your hand, can you tap out what you are feeling on a chair or table in front of you, so that I can hear?" (Note: not all patients can do this)       N/a  6. HEART RATE: "Can you tell me your heart rate?" "How many beats in 15 seconds?"  (Note: not all patients can do this)      140 ; irreg. 3:40 PM ; BP 124/72; 02 Sat. 95 % on room air 7. RECURRENT SYMPTOM: "Have you ever had this before?" If so, ask: "When was the last time?" and "What happened that time?"      Yes, since d/c'd from Rehab 8. CAUSE: "What do you think is causing the palpitations?"    Has Atrial Fib 9. CARDIAC HISTORY: "Do you have any history of heart disease?" (e.g., heart attack, angina, bypass surgery, angioplasty, arrhythmia)      Atrial Fib 10. OTHER SYMPTOMS: "Do you have any other symptoms?" (e.g., dizziness, chest pain, sweating, difficulty breathing)      C/o light headedness off and on all day; denied chest pain, c/o being  very tired, some shortness of breath 11. PREGNANCY: "Is there any chance you are pregnant?" "When was your last menstrual period?"       N/a  Protocols used: [place]

## 2018-11-25 NOTE — Telephone Encounter (Signed)
Copied from [place] [id number]. Topic: Quick Communication - Home Health Verbal Orders >> Nov 25, 2018  9:34 AM Leward Quan A wrote: Caller/Agency: Marlowe Kays / [place] care Callback Number: [phone] ok to leave message Requesting OT/PT/Skilled Nursing/Social Work:  OT Frequency: 2 xs wk  2 wks, 1 x wk  1 wk

## 2018-11-25 NOTE — Telephone Encounter (Signed)
Called pt back and her brother answered. He stated that there was someone there from [place], which is a service offered through [employer]. I spoke with him and he stated that the pt does seem to be in A. Fib, her lungs sounded clear and she was not having any chest pain. He had also advised the pt that she needed to go to the ED. I spoke with pt to see if she was going to go or not and she stated that she was going to.

## 2018-11-26 ENCOUNTER — Encounter
Admission: EM | Disposition: A | Discharge status: Home Health | Payer: Self-pay | Source: Home / Self Care | Attending: Internal Medicine

## 2018-11-26 ENCOUNTER — Telehealth: Payer: Self-pay | Admitting: Family Medicine

## 2018-11-26 ENCOUNTER — Observation Stay: Payer: PPO | Admitting: Anesthesiology

## 2018-11-26 ENCOUNTER — Other Ambulatory Visit: Payer: Self-pay

## 2018-11-26 ENCOUNTER — Ambulatory Visit: Payer: PPO

## 2018-11-26 ENCOUNTER — Encounter: Payer: Self-pay | Admitting: Cardiovascular Disease

## 2018-11-26 DIAGNOSIS — I4892 Unspecified atrial flutter: Principal | ICD-10-CM

## 2018-11-26 DIAGNOSIS — E785 Hyperlipidemia, unspecified: Secondary | ICD-10-CM | POA: Diagnosis present

## 2018-11-26 DIAGNOSIS — Z794 Long term (current) use of insulin: Secondary | ICD-10-CM

## 2018-11-26 DIAGNOSIS — I10 Essential (primary) hypertension: Secondary | ICD-10-CM

## 2018-11-26 DIAGNOSIS — E1122 Type 2 diabetes mellitus with diabetic chronic kidney disease: Secondary | ICD-10-CM | POA: Diagnosis not present

## 2018-11-26 DIAGNOSIS — I5032 Chronic diastolic (congestive) heart failure: Secondary | ICD-10-CM | POA: Diagnosis not present

## 2018-11-26 DIAGNOSIS — Z8673 Personal history of transient ischemic attack (TIA), and cerebral infarction without residual deficits: Secondary | ICD-10-CM | POA: Diagnosis not present

## 2018-11-26 DIAGNOSIS — Z8249 Family history of ischemic heart disease and other diseases of the circulatory system: Secondary | ICD-10-CM | POA: Diagnosis not present

## 2018-11-26 DIAGNOSIS — Z79899 Other long term (current) drug therapy: Secondary | ICD-10-CM | POA: Diagnosis not present

## 2018-11-26 DIAGNOSIS — I5030 Unspecified diastolic (congestive) heart failure: Secondary | ICD-10-CM | POA: Diagnosis not present

## 2018-11-26 DIAGNOSIS — J810 Acute pulmonary edema: Secondary | ICD-10-CM | POA: Diagnosis not present

## 2018-11-26 DIAGNOSIS — I5033 Acute on chronic diastolic (congestive) heart failure: Secondary | ICD-10-CM | POA: Diagnosis present

## 2018-11-26 DIAGNOSIS — Z7982 Long term (current) use of aspirin: Secondary | ICD-10-CM | POA: Diagnosis not present

## 2018-11-26 DIAGNOSIS — I272 Pulmonary hypertension, unspecified: Secondary | ICD-10-CM | POA: Diagnosis present

## 2018-11-26 DIAGNOSIS — E119 Type 2 diabetes mellitus without complications: Secondary | ICD-10-CM | POA: Diagnosis present

## 2018-11-26 DIAGNOSIS — Z833 Family history of diabetes mellitus: Secondary | ICD-10-CM | POA: Diagnosis not present

## 2018-11-26 DIAGNOSIS — D638 Anemia in other chronic diseases classified elsewhere: Secondary | ICD-10-CM | POA: Diagnosis present

## 2018-11-26 DIAGNOSIS — I495 Sick sinus syndrome: Secondary | ICD-10-CM | POA: Diagnosis present

## 2018-11-26 DIAGNOSIS — I4891 Unspecified atrial fibrillation: Secondary | ICD-10-CM | POA: Diagnosis present

## 2018-11-26 DIAGNOSIS — E876 Hypokalemia: Secondary | ICD-10-CM | POA: Diagnosis present

## 2018-11-26 DIAGNOSIS — Z90710 Acquired absence of both cervix and uterus: Secondary | ICD-10-CM | POA: Diagnosis not present

## 2018-11-26 DIAGNOSIS — I110 Hypertensive heart disease with heart failure: Secondary | ICD-10-CM | POA: Diagnosis present

## 2018-11-26 DIAGNOSIS — I2510 Atherosclerotic heart disease of native coronary artery without angina pectoris: Secondary | ICD-10-CM | POA: Diagnosis present

## 2018-11-26 DIAGNOSIS — J449 Chronic obstructive pulmonary disease, unspecified: Secondary | ICD-10-CM | POA: Diagnosis present

## 2018-11-26 DIAGNOSIS — Z79890 Hormone replacement therapy (postmenopausal): Secondary | ICD-10-CM | POA: Diagnosis not present

## 2018-11-26 DIAGNOSIS — N183 Chronic kidney disease, stage 3 (moderate): Secondary | ICD-10-CM

## 2018-11-26 DIAGNOSIS — E039 Hypothyroidism, unspecified: Secondary | ICD-10-CM | POA: Diagnosis present

## 2018-11-26 DIAGNOSIS — Z7901 Long term (current) use of anticoagulants: Secondary | ICD-10-CM | POA: Diagnosis not present

## 2018-11-26 HISTORY — PX: CARDIOVERSION: EP1203

## 2018-11-26 LAB — CBC
HCT: 33.6 % — ABNORMAL LOW (ref 36.0–46.0)
Hemoglobin: 10.3 g/dL — ABNORMAL LOW (ref 12.0–15.0)
MCH: 27.4 pg (ref 26.0–34.0)
MCHC: 30.7 g/dL (ref 30.0–36.0)
MCV: 89.4 fL (ref 80.0–100.0)
Platelets: 244 10*3/uL (ref 150–400)
RBC: 3.76 MIL/uL — AB (ref 3.87–5.11)
RDW: 16.6 % — ABNORMAL HIGH (ref 11.5–15.5)
WBC: 7.8 10*3/uL (ref 4.0–10.5)
nRBC: 0 % (ref 0.0–0.2)

## 2018-11-26 LAB — BASIC METABOLIC PANEL
ANION GAP: 7 (ref 5–15)
BUN: 14 mg/dL (ref 8–23)
CO2: 25 mmol/L (ref 22–32)
Calcium: 8.3 mg/dL — ABNORMAL LOW (ref 8.9–10.3)
Chloride: 109 mmol/L (ref 98–111)
Creatinine, Ser: 0.9 mg/dL (ref 0.44–1.00)
GFR calc Af Amer: >60 mL/min (ref >60)
GFR calc non Af Amer: 59 mL/min — ABNORMAL LOW (ref >60)
Glucose, Bld: 159 mg/dL — ABNORMAL HIGH (ref 70–99)
Potassium: 4.1 mmol/L (ref 3.5–5.1)
Sodium: 141 mmol/L (ref 135–145)

## 2018-11-26 LAB — URINALYSIS, COMPLETE (UACMP) WITH MICROSCOPIC
Bacteria, UA: NONE SEEN
Bilirubin Urine: NEGATIVE
Glucose, UA: NEGATIVE mg/dL
Hgb urine dipstick: NEGATIVE
Ketones, ur: NEGATIVE mg/dL
Leukocytes,Ua: NEGATIVE
Nitrite: NEGATIVE
Protein, ur: NEGATIVE mg/dL
Specific Gravity, Urine: 1.008 (ref 1.005–1.030)
pH: 5 (ref 5.0–8.0)

## 2018-11-26 LAB — GLUCOSE, CAPILLARY
GLUCOSE-CAPILLARY: 147 mg/dL — AB (ref 70–99)
Glucose-Capillary: 128 mg/dL — ABNORMAL HIGH (ref 70–99)
Glucose-Capillary: 139 mg/dL — ABNORMAL HIGH (ref 70–99)
Glucose-Capillary: 116 mg/dL — ABNORMAL HIGH (ref 70–99)
Glucose-Capillary: 119 mg/dL — ABNORMAL HIGH (ref 70–99)
Glucose-Capillary: 123 mg/dL — ABNORMAL HIGH (ref 70–99)

## 2018-11-26 LAB — TROPONIN I
Troponin I: 0.05 ng/mL (ref <0.03)
Troponin I: 0.04 ng/mL (ref <0.03)

## 2018-11-26 LAB — CBC WITH DIFFERENTIAL/PLATELET
Abs Immature Granulocytes: 0.03 10*3/uL (ref 0.00–0.07)
Basophils Absolute: 0.1 10*3/uL (ref 0.0–0.1)
Basophils Relative: 1 %
EOS ABS: 0.2 10*3/uL (ref 0.0–0.5)
Eosinophils Relative: 3 %
HCT: 35.8 % — ABNORMAL LOW (ref 36.0–46.0)
Hemoglobin: 11.1 g/dL — ABNORMAL LOW (ref 12.0–15.0)
IMMATURE GRANULOCYTES: 0 %
LYMPHS ABS: 2.6 10*3/uL (ref 0.7–4.0)
Lymphocytes Relative: 32 %
MCH: 27.4 pg (ref 26.0–34.0)
MCHC: 31 g/dL (ref 30.0–36.0)
MCV: 88.4 fL (ref 80.0–100.0)
Monocytes Absolute: 0.7 10*3/uL (ref 0.1–1.0)
Monocytes Relative: 8 %
NEUTROS PCT: 56 %
Neutro Abs: 4.6 10*3/uL (ref 1.7–7.7)
Platelets: 263 10*3/uL (ref 150–400)
RBC: 4.05 MIL/uL (ref 3.87–5.11)
RDW: 16.5 % — AB (ref 11.5–15.5)
WBC: 8.2 10*3/uL (ref 4.0–10.5)
nRBC: 0 % (ref 0.0–0.2)

## 2018-11-26 LAB — MRSA PCR SCREENING: MRSA by PCR: NEGATIVE

## 2018-11-26 SURGERY — CARDIOVERSION (CATH LAB)
Anesthesia: General

## 2018-11-26 MED ORDER — SODIUM CHLORIDE 0.9% FLUSH: 3.0000 mL | Freq: Two times a day (BID) | INTRAVENOUS | Status: DC

## 2018-11-26 MED ORDER — ASPIRIN EC 81 MG PO TBEC
81.0000 mg | DELAYED_RELEASE_TABLET | Freq: Every day | ORAL | Status: DC
  Administered 2018-11-26 – 2018-11-27 (×2): 81 mg via ORAL
  Filled 2018-11-26 (×2): qty 1

## 2018-11-26 MED ORDER — PROPOFOL 10 MG/ML IV BOLUS
INTRAVENOUS | Status: AC
  Filled 2018-11-26: qty 20

## 2018-11-26 MED ORDER — FUROSEMIDE 20 MG PO TABS: 20.0000 mg | ORAL_TABLET | Freq: Every day | ORAL | Status: DC

## 2018-11-26 MED ORDER — SIMVASTATIN 20 MG PO TABS: 40.0000 mg | ORAL_TABLET | Freq: Every evening | ORAL | Status: DC

## 2018-11-26 MED ORDER — SIMVASTATIN 20 MG PO TABS
20.0000 mg | ORAL_TABLET | Freq: Every evening | ORAL | Status: DC
  Administered 2018-11-26 – 2018-11-27 (×2): 20 mg via ORAL
  Filled 2018-11-26 (×2): qty 1

## 2018-11-26 MED ORDER — ACETAMINOPHEN 325 MG PO TABS: 650.0000 mg | ORAL_TABLET | Freq: Four times a day (QID) | ORAL | Status: DC | PRN

## 2018-11-26 MED ORDER — INSULIN ASPART 100 UNIT/ML ~~LOC~~ SOLN
0.0000 [IU] | Freq: Every day | SUBCUTANEOUS | Status: DC
  Administered 2018-11-27: 2 [IU] via SUBCUTANEOUS
  Filled 2018-11-26: qty 1

## 2018-11-26 MED ORDER — SODIUM CHLORIDE 0.9 % IV SOLN
250.0000 mL | INTRAVENOUS | Status: DC
  Administered 2018-11-26: 10:00:00 via INTRAVENOUS
  Administered 2018-11-26: 250 mL via INTRAVENOUS

## 2018-11-26 MED ORDER — POTASSIUM CHLORIDE CRYS ER 10 MEQ PO TBCR
10.0000 meq | EXTENDED_RELEASE_TABLET | Freq: Every day | ORAL | Status: DC
  Administered 2018-11-26 – 2018-11-28 (×3): 10 meq via ORAL
  Filled 2018-11-26 (×3): qty 1

## 2018-11-26 MED ORDER — PANTOPRAZOLE SODIUM 40 MG PO TBEC
40.0000 mg | DELAYED_RELEASE_TABLET | Freq: Every day | ORAL | Status: DC
  Administered 2018-11-26 – 2018-11-28 (×3): 40 mg via ORAL
  Filled 2018-11-26 (×3): qty 1

## 2018-11-26 MED ORDER — APIXABAN 5 MG PO TABS
5.0000 mg | ORAL_TABLET | Freq: Two times a day (BID) | ORAL | Status: DC
  Administered 2018-11-26: 5 mg via ORAL
  Filled 2018-11-26 (×2): qty 1

## 2018-11-26 MED ORDER — APIXABAN 5 MG PO TABS: 5.0000 mg | ORAL_TABLET | Freq: Two times a day (BID) | ORAL | 1 refills | Status: DC

## 2018-11-26 MED ORDER — DIGOXIN 0.25 MG/ML IJ SOLN
0.1250 mg | Freq: Once | INTRAMUSCULAR | Status: AC
  Administered 2018-11-26: 0.125 mg via INTRAVENOUS
  Filled 2018-11-26: qty 2

## 2018-11-26 MED ORDER — METOPROLOL TARTRATE 5 MG/5ML IV SOLN
10.0000 mg | Freq: Once | INTRAVENOUS | Status: AC
  Administered 2018-11-26: 10 mg via INTRAVENOUS
  Filled 2018-11-26: qty 10

## 2018-11-26 MED ORDER — ACETAMINOPHEN 650 MG RE SUPP: 650.0000 mg | Freq: Four times a day (QID) | RECTAL | Status: DC | PRN

## 2018-11-26 MED ORDER — SODIUM CHLORIDE 0.9% FLUSH
10.0000 mL | Freq: Two times a day (BID) | INTRAVENOUS | Status: DC
  Administered 2018-11-26 – 2018-11-28 (×3): 10 mL via INTRAVENOUS

## 2018-11-26 MED ORDER — AMIODARONE HCL 200 MG PO TABS
200.0000 mg | ORAL_TABLET | Freq: Three times a day (TID) | ORAL | Status: DC
  Administered 2018-11-26 – 2018-11-28 (×8): 200 mg via ORAL
  Filled 2018-11-26 (×8): qty 1

## 2018-11-26 MED ORDER — ONDANSETRON HCL 4 MG/2ML IJ SOLN: 4.0000 mg | Freq: Four times a day (QID) | INTRAMUSCULAR | Status: DC | PRN

## 2018-11-26 MED ORDER — SODIUM CHLORIDE 0.9% FLUSH: 3.0000 mL | INTRAVENOUS | Status: DC | PRN

## 2018-11-26 MED ORDER — FUROSEMIDE 10 MG/ML IJ SOLN
40.0000 mg | Freq: Once | INTRAMUSCULAR | Status: AC
  Administered 2018-11-26: 40 mg via INTRAVENOUS

## 2018-11-26 MED ORDER — LEVOTHYROXINE SODIUM 50 MCG PO TABS
75.0000 ug | ORAL_TABLET | Freq: Every day | ORAL | Status: DC
  Administered 2018-11-26 – 2018-11-28 (×3): 75 ug via ORAL
  Filled 2018-11-26 (×3): qty 1

## 2018-11-26 MED ORDER — METOPROLOL SUCCINATE ER 25 MG PO TB24
25.0000 mg | ORAL_TABLET | Freq: Every day | ORAL | Status: DC
  Administered 2018-11-26 – 2018-11-28 (×3): 25 mg via ORAL
  Filled 2018-11-26 (×3): qty 1

## 2018-11-26 MED ORDER — PROPOFOL 10 MG/ML IV BOLUS
INTRAVENOUS | Status: DC | PRN
  Administered 2018-11-26: 30 mg via INTRAVENOUS

## 2018-11-26 MED ORDER — AMIODARONE HCL IN DEXTROSE 360-4.14 MG/200ML-% IV SOLN
30.0000 mg/h | INTRAVENOUS | Status: DC
  Filled 2018-11-26: qty 200

## 2018-11-26 MED ORDER — DIGOXIN 0.25 MG/ML IJ SOLN
0.1250 mg | Freq: Once | INTRAMUSCULAR | Status: AC
  Administered 2018-11-26: 0.125 mg via INTRAVENOUS
  Filled 2018-11-26 (×2): qty 2

## 2018-11-26 MED ORDER — ONDANSETRON HCL 4 MG PO TABS: 4.0000 mg | ORAL_TABLET | Freq: Four times a day (QID) | ORAL | Status: DC | PRN

## 2018-11-26 MED ORDER — APIXABAN 5 MG PO TABS
5.0000 mg | ORAL_TABLET | Freq: Two times a day (BID) | ORAL | Status: DC
  Administered 2018-11-26 – 2018-11-28 (×5): 5 mg via ORAL
  Filled 2018-11-26 (×5): qty 1

## 2018-11-26 MED ORDER — INSULIN ASPART 100 UNIT/ML ~~LOC~~ SOLN
0.0000 [IU] | Freq: Three times a day (TID) | SUBCUTANEOUS | Status: DC
  Administered 2018-11-26 – 2018-11-27 (×2): 1 [IU] via SUBCUTANEOUS
  Administered 2018-11-27: 3 [IU] via SUBCUTANEOUS
  Administered 2018-11-28: 5 [IU] via SUBCUTANEOUS
  Filled 2018-11-26 (×5): qty 1

## 2018-11-26 MED ORDER — AMIODARONE HCL IN DEXTROSE 360-4.14 MG/200ML-% IV SOLN
60.0000 mg/h | INTRAVENOUS | Status: DC
  Administered 2018-11-26: 60 mg/h via INTRAVENOUS

## 2018-11-26 MED ORDER — FUROSEMIDE 10 MG/ML IJ SOLN
INTRAMUSCULAR | Status: AC
  Administered 2018-11-26: 40 mg via INTRAVENOUS
  Filled 2018-11-26: qty 4

## 2018-11-26 MED ORDER — INSULIN GLARGINE 100 UNIT/ML ~~LOC~~ SOLN
10.0000 [IU] | Freq: Every day | SUBCUTANEOUS | Status: DC
  Administered 2018-11-26 – 2018-11-28 (×3): 10 [IU] via SUBCUTANEOUS
  Filled 2018-11-26 (×4): qty 0.1

## 2018-11-26 NOTE — Evaluation (Signed)
Physical Therapy Evaluation Patient Details Name: Diane Mullins MRN: [id number] DOB: 28-May-1933 Today's Date: 11/26/2018   History of Present Illness  85 y/o female recently here with dizziness, weakness, Atrial flutter and d/c to rehab, s/p cardioversion 10/21/2018.  She recently returned back to [facility].  Pt here with same, with cardioversion 3/10 AM.  Clinical Impression  Pt did well with ambulation in room on room air and though she had some fatigue she ultimately did well.  She reports that she did relatively well at rehab after last admit and feels good about being able to return to [facility]) with continuation of the HHPT that just was initiated.  Pt wanting to take a nap and returned to bed post session.     Follow Up Recommendations Home health PT    Equipment Recommendations  None recommended by PT    Recommendations for Other Services       Precautions / Restrictions Precautions Precautions: Fall Restrictions Weight Bearing Restrictions: No      Mobility  Bed Mobility Overal bed mobility: Modified Independent             General bed mobility comments: able to get to sitting EOB w/o direct assist  Transfers Overall transfer level: Modified independent Equipment used: Rolling walker (2 wheeled)             General transfer comment: Pt able to rise at EOB w/o assist and with good relative confidence, definite need of AD, but not explicitly unsafe/unsteady  Ambulation/Gait Ambulation/Gait assistance: Min guard Gait Distance (Feet): 65 Feet Assistive device: Rolling walker (2 wheeled)       General Gait Details: Pt did not want to leave room but managed a few loops in the room and overall did reasonably well despite some minimal fatigue.   Stairs            Wheelchair Mobility    Modified Rankin (Stroke Patients Only)       Balance Overall balance assessment: Modified Independent                                            Pertinent Vitals/Pain Pain Assessment: No/denies pain    Home Living Family/patient expects to be discharged to:: Private residence([facility] ILF)   Available Help at Discharge: Family Type of Home: Independent living facility Home Access: Level entry       Home Equipment: Walker - 4 wheels      Prior Function Level of Independence: Independent with assistive device(s)         Comments: Pt walks down to meals 2x/day, tries to stay somewhat active     Hand Dominance   Dominant Hand: Right    Extremity/Trunk Assessment   Upper Extremity Assessment Upper Extremity Assessment: Generalized weakness(age appropriate defecits)    Lower Extremity Assessment Lower Extremity Assessment: Generalized weakness;Overall WFL for tasks assessed(age appropriate deficits)       Communication   Communication: HOH  Cognition Arousal/Alertness: Awake/alert Behavior During Therapy: WFL for tasks assessed/performed Overall Cognitive Status: Within Functional Limits for tasks assessed                                        General Comments      Exercises  Assessment/Plan    PT Assessment Patient needs continued PT services  PT Problem List Decreased strength;Decreased range of motion;Decreased activity tolerance;Decreased balance;Decreased mobility;Decreased knowledge of use of DME;Decreased safety awareness;Cardiopulmonary status limiting activity       PT Treatment Interventions DME instruction;Gait training;Functional mobility training;Therapeutic activities;Therapeutic exercise;Balance training;Neuromuscular re-education;Patient/family education    PT Goals (Current goals can be found in the Care Plan section)  Acute Rehab PT Goals Patient Stated Goal: go back to [facility] PT Goal Formulation: With patient Time For Goal Achievement: 12/10/18 Potential to Achieve Goals: Good    Frequency Min 2X/week   Barriers to discharge         Co-evaluation               AM-PAC PT "6 Clicks" Mobility  Outcome Measure Help needed turning from your back to your side while in a flat bed without using bedrails?: None Help needed moving from lying on your back to sitting on the side of a flat bed without using bedrails?: None Help needed moving to and from a bed to a chair (including a wheelchair)?: None Help needed standing up from a chair using your arms (e.g., wheelchair or bedside chair)?: None Help needed to walk in hospital room?: None Help needed climbing 3-5 steps with a railing? : A Little 6 Click Score: 23    End of Session Equipment Utilized During Treatment: Gait belt Activity Tolerance: Patient tolerated treatment well;Patient limited by fatigue Patient left: with bed alarm set;with call bell/phone within reach   PT Visit Diagnosis: Muscle weakness (generalized) (M62.81);Difficulty in walking, not elsewhere classified (R26.2)    Time: 2202-5427 PT Time Calculation (min) (ACUTE ONLY): 28 min   Charges:   PT Evaluation $PT Eval Low Complexity: 1 Low          Kreg Shropshire, DPT 11/26/2018, 3:24 PM

## 2018-11-26 NOTE — CV Procedure (Signed)
Cardioversion procedure note For atrial flutter, typical  Procedure Details:  Consent: Risks of procedure as well as the alternatives and risks of each were explained to the (patient/caregiver). Consent for procedure obtained.  Time Out: Verified patient identification, verified procedure, site/side was marked, verified correct patient position, special equipment/implants available, medications/allergies/relevent history reviewed, required imaging and test results available. Performed  Patient placed on cardiac monitor, pulse oximetry, supplemental oxygen as necessary.  Sedation given: propofol IV, Dr. Randa Lynn Pacer pads placed anterior and posterior chest.   Cardioverted 1 time(s).  Cardioverted at  150 J. Synchronized biphasic Converted to NSR   Evaluation: Findings: Post procedure EKG shows: NSR Complications: None. Bradycardia noted consistent with SSS for 30 sec after cardioversion, then rate into 70s NSR Patient did tolerate procedure well.  Getting increasing SOB, will give lasix 40 IV x 1  Time Spent Directly with the Patient:  45 minutes   Esmond Plants, M.D., Ph.D.

## 2018-11-26 NOTE — Clinical Social Work Note (Signed)
CSW received referral for SNF.  Case discussed with case manager and plan is to discharge home with home health.  CSW to sign off please re-consult if social work needs arise.  Denard Tuminello R. Legion Discher, MSW, LCSW [phone]  

## 2018-11-26 NOTE — Plan of Care (Signed)
Patient admitted at 0100. Heart rate is elevated in the 130's, administered .125 mg of digoxin. No complaints of pain. Patient profile completed.

## 2018-11-26 NOTE — Progress Notes (Signed)
  Amiodarone Drug - Drug Interaction Consult Note  Recommendations: Patient was previously on Simvastatin 40mg . Changed to 20mg  dose per protocol.  Amiodarone is metabolized by the cytochrome P450 system and therefore has the potential to cause many drug interactions. Amiodarone has an average plasma half-life of 50 days (range 20 to 100 days).   There is potential for drug interactions to occur several weeks or months after stopping treatment and the onset of drug interactions may be slow after initiating amiodarone.   [x]  Statins: Increased risk of myopathy. Simvastatin- restrict dose to 20mg  daily. Other statins: counsel patients to report any muscle pain or weakness immediately.  []  Anticoagulants: Amiodarone can increase anticoagulant effect. Consider warfarin dose reduction. Patients should be monitored closely and the dose of anticoagulant altered accordingly, remembering that amiodarone levels take several weeks to stabilize.  []  Antiepileptics: Amiodarone can increase plasma concentration of phenytoin, the dose should be reduced. Note that small changes in phenytoin dose can result in large changes in levels. Monitor patient and counsel on signs of toxicity.  []  Beta blockers: increased risk of bradycardia, AV block and myocardial depression. Sotalol - avoid concomitant use.  []   Calcium channel blockers (diltiazem and verapamil): increased risk of bradycardia, AV block and myocardial depression.  []   Cyclosporine: Amiodarone increases levels of cyclosporine. Reduced dose of cyclosporine is recommended.  []  Digoxin dose should be halved when amiodarone is started.  []  Diuretics: increased risk of cardiotoxicity if hypokalemia occurs.  []  Oral hypoglycemic agents (glyburide, glipizide, glimepiride): increased risk of hypoglycemia. Patient's glucose levels should be monitored closely when initiating amiodarone therapy.   [x]  Drugs that prolong the QT interval:  Torsades de pointes  risk may be increased with concurrent use - avoid if possible.  Monitor QTc, also keep magnesium/potassium WNL if concurrent therapy can't be avoided. Marland Kitchen Antibiotics: e.g. fluoroquinolones, erythromycin. . Antiarrhythmics: e.g. quinidine, procainamide, disopyramide, sotalol. . Antipsychotics: e.g. phenothiazines, haloperidol.  . Lithium, tricyclic antidepressants, and methadone.  Patient is currently ordered Ondansetron. Using caution. Can potentially prolong QT interval. Thank You,  Pearla Dubonnet  11/26/2018 2:12 PM

## 2018-11-26 NOTE — Anesthesia Procedure Notes (Signed)
Date/Time: 11/26/2018 10:33 AM Performed by: Emmie Niemann, MD Pre-anesthesia Checklist: Patient identified, Emergency Drugs available, Suction available, Patient being monitored and Timeout performed Oxygen Delivery Method: Nasal cannula

## 2018-11-26 NOTE — Telephone Encounter (Signed)
Patient still Hospitalized canceled HFU for 11/27/18 and scheduled for 11/28/18  Will verify tomorrow if DC'd

## 2018-11-26 NOTE — Progress Notes (Signed)
Patient called out stating," I am short of breath and I don't feel good." POCT was 139. No wheezing noted. Applied 2liters of oxygen, pulse is 100%. Blood pressure wdl. No complaints of pain and or nausea. Notified Dr. Marcille Blanco of current condition.

## 2018-11-26 NOTE — Progress Notes (Signed)
[place] [facility] [place] NAME: Diane Mullins    MR#:  [id number]  DATE OF BIRTH:  [birth year]  SUBJECTIVE:  CHIEF COMPLAINT:   Chief Complaint  Patient presents with  . Atrial Fibrillation   -Patient with known history of A. fib/flutter with recent admission and cardioversion brought back in again with palpitations and dyspnea and noted to be in atrial fibrillation again. -Rate still difficult to be controlled in spite of Cardizem and amiodarone drips. -Going for cardioversion this morning  REVIEW OF SYSTEMS:  Review of Systems  Constitutional: Positive for malaise/fatigue. Negative for chills and fever.  HENT: Positive for hearing loss. Negative for congestion, ear discharge and nosebleeds.   Eyes: Negative for blurred vision and double vision.  Respiratory: Negative for cough, shortness of breath and wheezing.   Cardiovascular: Positive for palpitations. Negative for chest pain and leg swelling.  Gastrointestinal: Negative for abdominal pain, constipation, diarrhea, nausea and vomiting.  Genitourinary: Negative for dysuria.  Musculoskeletal: Negative for myalgias.  Neurological: Negative for dizziness, focal weakness, seizures, weakness and headaches.  Psychiatric/Behavioral: Negative for depression.    DRUG ALLERGIES:   Allergies  Allergen Reactions  . Oxycodone     Other reaction(s): Confusion Patient's daughter reported confusion/sensitivity to oxycodone.     VITALS:  Blood pressure (!) 102/54, pulse (!) 59, temperature 97.6 F (36.4 C), resp. rate 19, height 5\' 1"  (1.549 m), weight 95.1 kg, SpO2 94 %.  PHYSICAL EXAMINATION:  Physical Exam   GENERAL:  85 y.o.-year-old elderly patient lying in the bed with no acute distress.  EYES: Pupils equal, round, reactive to light and accommodation. No scleral icterus. Extraocular muscles intact.  HEENT: Head atraumatic, normocephalic. Oropharynx and nasopharynx clear.  NECK:  Supple,  no jugular venous distention. No thyroid enlargement, no tenderness.  LUNGS: Normal breath sounds bilaterally, no wheezing rhonchi or crepitation. No use of accessory muscles of respiration. Bibasilar crackles. CARDIOVASCULAR: S1, S2 rapid and irregular. No  rubs, or gallops. 3/6 systolic murmur present ABDOMEN: Soft, nontender, nondistended. Bowel sounds present. No organomegaly or mass.  EXTREMITIES: No  cyanosis, or clubbing. 1+ pedal edema NEUROLOGIC: Cranial nerves II through XII are intact. Muscle strength 5/5 in all extremities. Sensation intact. Gait not checked. Global weakness noted. PSYCHIATRIC: The patient is alert and oriented x 3.  SKIN: No obvious rash, lesion, or ulcer.    LABORATORY PANEL:   CBC Recent Labs  Lab 11/26/18 0922  WBC 8.2  HGB 11.1*  HCT 35.8*  PLT 263   ------------------------------------------------------------------------------------------------------------------  Chemistries  Recent Labs  Lab 11/26/18 0412  NA 141  K 4.1  CL 109  CO2 25  GLUCOSE 159*  BUN 14  CREATININE 0.90  CALCIUM 8.3*   ------------------------------------------------------------------------------------------------------------------  Cardiac Enzymes Recent Labs  Lab 11/26/18 0922  TROPONINI 0.05*   ------------------------------------------------------------------------------------------------------------------  RADIOLOGY:  Dg Chest 2 View  Result Date: 11/25/2018 CLINICAL DATA:  Atrial fibrillation EXAM: CHEST - 2 VIEW COMPARISON:  10/26/2018 FINDINGS: Chronic cardiomegaly. Interstitial coarsening compatible with vascular congestion. No Kerley lines or air bronchogram. No effusion or pneumothorax. Hiatal hernia. IMPRESSION: Cardiomegaly and vascular congestion. Electronically Signed   By: Monte Fantasia M.D.   On: 11/25/2018 19:00    EKG:   Orders placed or performed during the hospital encounter of 11/25/18  . EKG 12-Lead  . EKG 12-Lead  . ED EKG  . ED  EKG  . EKG 12-Lead  . EKG 12-Lead pre-cardioversion  . EKG 12-Lead  .  EKG 12-Lead pre-cardioversion  . EKG 12-Lead  . EKG 12-Lead  . EKG 12-Lead  . EKG 12-Lead    ASSESSMENT AND PLAN:   85 year old female with past medical history significant for atrial flutter/fibrillation status post cardioversion last month on Eliquis, chronic diastolic heart failure, diabetes, hypertension, history of TIA, anemia of chronic disease and COPD not on home oxygen presents to hospital secondary to palpitations and noted to have atrial flutter again.  1.  Recurrent atrial flutter with RVR-was here last month for the same complaints and had cardioversion successfully. -On Eliquis for anticoagulation. -Since rate control was hard in spite of amiodarone, digoxin and Cardizem, for cardioversion today. -Appreciate cardiology consult. -If successfully cardioverted, change amiodarone to oral.  Also continue low-dose Toprol by mouth  2.  Acute on chronic diastolic CHF-likely triggered secondary to elevated rate from atrial flutter -Received 1 dose of IV Lasix during cardioversion. -Restart home dose of oral Lasix.  3.  Hypokalemia-being replaced  4.  Hypothyroidism-Synthroid  5.  diabetes mellitus-restart low-dose Lantus and sliding scale insulin  6.  DVT prophylaxis-on Eliquis  Patient recently got out of [facility] rehab.  We will get physical therapy consult   All the records are reviewed and case discussed with Care Management/Social Workerr. Management plans discussed with the patient, family and they are in agreement.  CODE STATUS: Full Code  TOTAL TIME TAKING CARE OF THIS PATIENT: 39 minutes.   POSSIBLE D/C IN 1-2 DAYS, DEPENDING ON CLINICAL CONDITION.   Gladstone Lighter M.D on 11/26/2018 at 12:49 PM  Between 7am to 6pm - Pager - [phone]  After 6pm go to [email or web address] - password [email or web address] [place] [facility]  Office  [phone]  CC: Primary care physician;  [name], MD

## 2018-11-26 NOTE — NC FL2 (Signed)
[place] LEVEL OF CARE SCREENING TOOL     IDENTIFICATION  Patient Name: Diane Mullins Birthdate: 1932-10-30 Sex: female Admission Date (Current Location): 11/25/2018  [place] and [place] Number:  Engineering geologist and Address:  [facility], [address], [place], [place] [address]      Provider Number: [id number]  Attending Physician Name and Address:  Gladstone Lighter, MD  Relative Name and Phone Number:       Current Level of Care: Hospital Recommended Level of Care: [place] Prior Approval Number:    Date Approved/Denied:   PASRR Number: [id number]  Discharge Plan: SNF    Current Diagnoses: Patient Active Problem List   Diagnosis Date Noted  . Atrial flutter ([place]) 10/17/2018  . 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 AKI (acute kidney injury) ([place])   . Acute on chronic diastolic heart failure ([place])   . Respiratory failure ([place]) 07/05/2018  . Atrial flutter with rapid ventricular response ([place]) 11/16/2017  . Coronary artery calcification seen on CAT scan 11/16/2017  . Morbid obesity ([place]) 10/30/2017  . (HFpEF) heart failure with preserved ejection fraction ([place]) 09/28/2017  . Family history of colon cancer 04/17/2017  . Closed right ankle fracture 10/28/2015  . Fatigue 10/18/2015  . TIA (transient ischemic attack) 09/18/2015  . Orthostatic hypotension 08/02/2015  . Adjustment disorder with anxious mood 03/30/2015  . Postmenopausal estrogen deficiency 06/03/2014  . DNR (do not resuscitate) discussion 03/04/2014  . Gait disturbance 05/07/2013  . Diabetes ([place]) 04/21/2013  . Shortness of breath 12/09/2012  . Hearing loss 12/09/2012  . Anxiety 01/31/2012  . Hypertension 12/22/2011  . Hyperlipidemia 12/22/2011  . Hypothyroidism 12/22/2011    Orientation RESPIRATION BLADDER Height & Weight     Self, Time, Situation, Place  O2(acute 2 Liters) Continent Weight: 209 lb 11.2 oz (95.1 kg) Height:  5\' 1"   (154.9 cm)  BEHAVIORAL SYMPTOMS/MOOD NEUROLOGICAL BOWEL NUTRITION STATUS      Continent Diet(heart healthy)  AMBULATORY STATUS COMMUNICATION OF NEEDS Skin   Limited Assist Verbally Normal                       Personal Care Assistance Level of Assistance  Bathing, Dressing Bathing Assistance: Limited assistance   Dressing Assistance: Limited assistance     Functional Limitations Info  Sight(wears glasses) Sight Info: Adequate        SPECIAL CARE FACTORS FREQUENCY  PT (By licensed PT), Diabetic urine testing     PT Frequency: daily              Contractures Contractures Info: Not present    Additional Factors Info  Code Status, Insulin Sliding Scale, Allergies Code Status Info: full code Allergies Info: oxycodone           Current Medications (11/26/2018):  This is the current hospital active medication list Current Facility-Administered Medications  Medication Dose Route Frequency Provider Last Rate Last Dose  . 0.9 %  sodium chloride infusion  250 mL Intravenous Continuous Minna Merritts, MD 1 mL/hr at 11/26/18 1018    . acetaminophen (TYLENOL) tablet 650 mg  650 mg Oral Q6H PRN Lance Coon, MD       Or  . acetaminophen (TYLENOL) suppository 650 mg  650 mg Rectal Q6H PRN Lance Coon, MD      . amiodarone (PACERONE) tablet 200 mg  200 mg Oral TID Minna Merritts, MD   200 mg at 11/26/18 1202  .  apixaban (ELIQUIS) tablet 5 mg  5 mg Oral BID Gladstone Lighter, MD   5 mg at 11/26/18 0945  . aspirin EC tablet 81 mg  81 mg Oral Daily Lance Coon, MD   81 mg at 11/26/18 2778  . furosemide (LASIX) tablet 20 mg  20 mg Oral Daily Visser, Jacquelyn D, PA-C      . insulin aspart (novoLOG) injection 0-5 Units  0-5 Units Subcutaneous QHS Lance Coon, MD      . insulin aspart (novoLOG) injection 0-9 Units  0-9 Units Subcutaneous TID WC Lance Coon, MD      . insulin glargine (LANTUS) injection 10 Units  10 Units Subcutaneous Daily Gladstone Lighter, MD       . levothyroxine (SYNTHROID, LEVOTHROID) tablet 75 mcg  75 mcg Oral Daily Lance Coon, MD   75 mcg at 11/26/18 0533  . metoprolol succinate (TOPROL-XL) 24 hr tablet 25 mg  25 mg Oral Daily Marrianne Mood D, PA-C   25 mg at 11/26/18 2423  . ondansetron (ZOFRAN) tablet 4 mg  4 mg Oral Q6H PRN Lance Coon, MD       Or  . ondansetron [facility]) injection 4 mg  4 mg Intravenous Q6H PRN Lance Coon, MD      . pantoprazole (PROTONIX) EC tablet 40 mg  40 mg Oral Daily Lance Coon, MD   40 mg at 11/26/18 0843  . potassium chloride (K-DUR,KLOR-CON) CR tablet 10 mEq  10 mEq Oral Daily Visser, Jacquelyn D, PA-C      . simvastatin (ZOCOR) tablet 40 mg  40 mg Oral QPM Lance Coon, MD      . sodium chloride flush (NS) 0.9 % injection 3 mL  3 mL Intravenous Q12H Gollan, Kathlene November, MD      . sodium chloride flush (NS) 0.9 % injection 3 mL  3 mL Intravenous PRN Rockey Situ, Kathlene November, MD         Discharge Medications: Please see discharge summary for a list of discharge medications.  Relevant Imaging Results:  Relevant Lab Results:   Additional Information ss# [id number]  Shela Leff, LCSW

## 2018-11-26 NOTE — Care Management Obs Status (Signed)
[place] NOTIFICATION   Patient Details  Name: DALESHA STANBACK MRN: [id number] Date of Birth: Oct 02, 1932   Medicare Observation Status Notification Given:  Yes    Marshell Garfinkel, RN 11/26/2018, 9:54 AM

## 2018-11-26 NOTE — Anesthesia Postprocedure Evaluation (Signed)
Anesthesia Post Note  Patient: Diane Mullins  Procedure(s) Performed: CARDIOVERSION (CATH LAB) (N/A )  Patient location during evaluation: Other (specials recovery) Anesthesia Type: General Level of consciousness: awake and alert and oriented Pain management: pain level controlled Vital Signs Assessment: post-procedure vital signs reviewed and stable Respiratory status: spontaneous breathing, nonlabored ventilation and respiratory function stable Cardiovascular status: blood pressure returned to baseline and stable Postop Assessment: no signs of nausea or vomiting Anesthetic complications: no     Last Vitals:  Vitals:   11/26/18 1100 11/26/18 1115  BP: 129/78 (!) 100/51  Pulse: (!) 59   Resp: 19   Temp:    SpO2: 96%     Last Pain:  Vitals:   11/26/18 1100  TempSrc:   PainSc: 0-No pain                 Ova Gillentine

## 2018-11-26 NOTE — Consult Note (Addendum)
Cardiology Consultation:   Patient ID: JETAIME PINNIX MRN: [id number]; DOB: 05-26-1933  Admit date: 11/25/2018 Date of Consult: 11/26/2018  Primary Care Provider: [name], MD Primary Cardiologist: Ida Rogue, MD  Primary Electrophysiologist:  None    Patient Profile:   Diane Mullins is a 85 y.o. female with a hx of atrial flutter with rapid ventricular response s/p TEE/DCCV (10/22/2018), chronic diastolic heart failure, type 2 diabetes, hypertension, hyperlipidemia previous CVA/TIA, h/o anemia, COPD, and vertigo who is being seen today for the evaluation of atrial flutter with rapid ventricular response at the request of Dr. Jannifer Franklin.  History of Present Illness:   Ms. Armes is an 85 year old female with PMH as above.  Most recent admission to [facility] was 10/17/2018, at which time she was found to also be in atrial flutter with 2: 1 conduction and RVR.  Because of the difficulty controlling its rate, as well as the fact that she was symptomatic with palpitations and shortness of breath, she underwent TEE / cardioversion (10/22/2018) on anticoagulation with apixaban. Following DCCV, she had some episodes of SB and junctional rhythm with brief runs of AT. Seen by Dr. Caryl Comes (10/24/2018) same admission with recommendation to not undergo ablation or continue rate control given her bradycardia / sinus node dysfunction. It was, however, recommended to continue anticoagulation given her h/o TIA and high risk for thromboembolism. Anticoagulation was continued, despite her history of anemia, with recommendation to treat the anemia over discontinuing her anticoagulation.  She was discharged 10/28/2018.  On 11/25/2018, patient presented to [facility] ED with complaint of rapid heart rate and palpitations that woke her from sleep around 5 AM that morning.  Associated symptoms included shortness of breath.  She denied any chest pain or pressure at that time.  In the ED, it was noted that she had heart rate in the 140s  and rhythm atrial flutter with rapid ventricular response.  She was placed on a Cardizem drip and given metoprolol tartrate and digoxin, as well as amiodarone drip without improvement in rate.  She was admitted for further work-up and evaluation and cardiology consulted.  At the time of consultation, patient reported nonpleuritic, nonradiating, and central chest pressure that had just started as of that morning (3/10).  She continued to report feelings of rapid heart rate and shortness of breath (not on home oxygen).   Past Medical History:  Diagnosis Date  . Atrial fibrillation ([place])   . Benign paroxysmal positional vertigo 07/17/2014  . Chronic diastolic CHF (congestive heart failure) ([place])    a. echo 10/19: EF of 60-65%, no RWMA, Gr2DD, calcified mitral annulus with mild MR, mildly to moderately dilated left atrium, moderate TR, PASP 52 mmHg  . Diabetes mellitus   . H/O: rheumatic fever   . Hyperlipidemia   . Hypertension   . Pulmonary hypertension ([place])   . Stroke [facility])    TIA Jan. 1st  . Teratoma of left ovary   . Thyroid disease     Past Surgical History:  Procedure Laterality Date  . ABDOMINAL HYSTERECTOMY  1973   menorrhagia  . CARDIOVERSION N/A 10/22/2018   Procedure: CARDIOVERSION;  Surgeon: Nelva Bush, MD;  Location: [facility] ORS;  Service: Cardiovascular;  Laterality: N/A;  . [place]  . ORIF ANKLE FRACTURE Right 10/29/2015   Procedure: OPEN REDUCTION INTERNAL FIXATION (ORIF) ANKLE FRACTURE;  Surgeon: Thornton Park, MD;  Location: [facility] ORS;  Service: Orthopedics;  Laterality: Right;  . TEE WITHOUT CARDIOVERSION N/A 10/22/2018   Procedure:  TRANSESOPHAGEAL ECHOCARDIOGRAM (TEE);  Surgeon: Nelva Bush, MD;  Location: [facility] ORS;  Service: Cardiovascular;  Laterality: N/A;  . VAGINAL DELIVERY     x4     Home Medications:  Prior to Admission medications   Medication Sig Start Date End Date Taking? Authorizing Provider  acetaminophen (TYLENOL) 325 MG tablet Take  650 mg by mouth every 6 (six) hours as needed.   Yes [provider]  apixaban (ELIQUIS) 5 MG TABS tablet Take 1 tablet (5 mg total) by mouth 2 (two) times daily. 10/28/18  Yes Henreitta Leber, MD  aspirin EC 81 MG tablet Take 81 mg by mouth daily.   Yes [provider]  ferrous sulfate 325 (65 FE) MG EC tablet Take 1 tablet (325 mg total) by mouth daily with breakfast. 08/30/18  Yes Guse, Jacquelynn Cree, FNP  furosemide (LASIX) 20 MG tablet Take 1 tablet (20 mg total) by mouth daily. 07/10/18  Yes Gladstone Lighter, MD  glucose blood (ONE TOUCH ULTRA TEST) test strip Use as instructed to check Blood sugar three times a day.  E11.65 Dispense patient choice. 07/30/18  Yes Guse, Jacquelynn Cree, FNP  ibuprofen (ADVIL,MOTRIN) 200 MG tablet Take 200 mg by mouth every 6 (six) hours as needed.   Yes [provider]  Insulin Glargine (LANTUS SOLOSTAR) 100 UNIT/ML Solostar Pen INJECT  17 UNITS SUBCUTANEOUSLY IN THE MORNING 08/27/18  Yes Guse, Jacquelynn Cree, FNP  Insulin Pen Needle (PEN NEEDLES) 32G X 4 MM MISC Inject 1 Dose into the skin daily. 12/19/16  Yes [name], MD  levothyroxine (SYNTHROID, LEVOTHROID) 75 MCG tablet Take 1 tablet (75 mcg total) by mouth daily. 04/09/18  Yes [name], MD  losartan (COZAAR) 50 MG tablet Take 50 mg by mouth daily.   Yes [provider]  metFORMIN (GLUCOPHAGE) 1000 MG tablet Take 1 tablet (1,000 mg total) by mouth 2 (two) times daily with a meal. TAKE 1 TABLET TWICE DAILY  WITH  A  MEAL 04/09/18  Yes [name], MD  metoprolol succinate (TOPROL-XL) 25 MG 24 hr tablet Take 1 tablet (25 mg total) by mouth daily. 08/27/18  Yes Guse, Jacquelynn Cree, FNP  omeprazole (PRILOSEC) 20 MG capsule Take 1 capsule (20 mg total) by mouth daily. 04/09/18  Yes [name], MD  polyethylene glycol [facility] / GLYCOLAX) packet Take 17 g by mouth daily as needed.    Yes [provider]  potassium chloride (K-DUR) 10 MEQ tablet TAKE 1 TABLET  BY MOUTH EVERY DAY 09/12/18  Yes [name], MD  simvastatin (ZOCOR) 40 MG tablet Take 1 tablet (40 mg total) by mouth every evening. 04/09/18  Yes [name], MD    Inpatient Medications: Scheduled Meds: . apixaban  5 mg Oral BID  . aspirin EC  81 mg Oral Daily  . insulin aspart  0-5 Units Subcutaneous QHS  . insulin aspart  0-9 Units Subcutaneous TID WC  . levothyroxine  75 mcg Oral Daily  . metoprolol succinate  25 mg Oral Daily  . pantoprazole  40 mg Oral Daily  . simvastatin  40 mg Oral QPM   Continuous Infusions: . amiodarone 30 mg/hr (11/26/18 0325)   PRN Meds: acetaminophen **OR** acetaminophen, ondansetron **OR** ondansetron (ZOFRAN) IV  Allergies:    Allergies  Allergen Reactions  . Oxycodone     Other reaction(s): Confusion Patient's daughter reported confusion/sensitivity to oxycodone.     Social History:   Social History   Socioeconomic History  .  Marital status: Widowed    Spouse name: Not on file  . Number of children: 4  . Years of education: Not on file  . Highest education level: Not on file  Occupational History  . Not on file  Social Needs  . Financial resource strain: Not hard at all  . Food insecurity:    Worry: Never true    Inability: Never true  . Transportation needs:    Medical: No    Non-medical: No  Tobacco Use  . Smoking status: Never Smoker  . Smokeless tobacco: Never Used  Substance and Sexual Activity  . Alcohol use: No  . Drug use: No  . Sexual activity: Never  Lifestyle  . Physical activity:    Days per week: 3 days    Minutes per session: 50 min  . Stress: Not at all  Relationships  . Social connections:    Talks on phone: Not on file    Gets together: Not on file    Attends religious service: Not on file    Active member of club or organization: Not on file    Attends meetings of clubs or organizations: Not on file    Relationship status: Not on file  . Intimate partner violence:    Fear of  current or ex partner: Not on file    Emotionally abused: Not on file    Physically abused: Not on file    Forced sexual activity: Not on file  Other Topics Concern  . Not on file  Social History Narrative   Lives in [place] alone. No pets. Retired from [employer].   Diet: healthy   Exercise: water aerobics twice weekly, balance class   Ambulates with a walker at baseline    Family History:    Family History  Problem Relation Age of Onset  . Heart disease Mother 26  . Heart attack Mother 67  . Cancer Father        colon  . Diabetes Sister   . Hypertension Sister   . Cancer Brother        kidney  . Diabetes Brother   . Cancer Daughter        ovarian  . Non-Hodgkin's lymphoma Son      ROS:  Please see the history of present illness.  Review of Systems  Constitutional: Negative for chills and fever.  HENT: Positive for hearing loss.        Chronic hearing loss (not currently wearing hearing aids)  Respiratory: Positive for shortness of breath.   Cardiovascular: Positive for chest pain and palpitations.       Central / non-pleuritic / non-radiating chest pressure, palpitations, rapid heart rate  Gastrointestinal: Negative for abdominal pain, blood in stool and melena.  Genitourinary: Negative for hematuria.  Musculoskeletal: Negative for myalgias.  Psychiatric/Behavioral: Negative for substance abuse.  All other systems reviewed and are negative.   All other ROS reviewed and negative.     Physical Exam/Data:   Vitals:   11/25/18 2331 11/26/18 0031 11/26/18 0317 11/26/18 0504  BP: 97/76 131/71 115/69 (!) 113/50  Pulse: (!) 142 (!) 142 (!) 135 (!) 130  Resp: (!) 26  (!) 24   Temp:  97.8 F (36.6 C) 97.6 F (36.4 C)   TempSrc:  Oral    SpO2: 96% 97% 93% 100%  Weight:  95.1 kg    Height:  5\' 1"  (1.549 m)      Intake/Output Summary (Last 24 hours) at 11/26/2018 0759 Last  data filed at 11/26/2018 0700 Gross per 24 hour  Intake 1044.88 ml  Output 600 ml  Net 444.88 ml    Filed Weights   11/25/18 1832 11/26/18 0031  Weight: 91.6 kg 95.1 kg   Body mass index is 39.62 kg/m.  General: Elderly female in no acute distress HEENT: normal, on nasal cannula oxygen, extremely hard of hearing and without hearing aids Neck: Mild JVD Vascular: No carotid bruits; radial pulses 2+ bilaterally Cardiac: Regular rhythm, tachycardic; no murmur  Lungs: Slight bibasilar crackles, no wheezing, rhonchi or rales  Abd: soft, nontender, no hepatomegaly  Ext: Nonpitting bilateral lower extremity edema Musculoskeletal:  No deformities, BUE and BLE strength normal and equal Skin: warm and dry  Neuro:   no focal abnormalities noted Psych:  Normal affect   EKG:  The EKG was personally reviewed and demonstrates: Atrial flutter with 2:1 response, LVH and LAD, ventricular rate 148 Telemetry:  Telemetry was personally reviewed and demonstrates: Atrial flutter with RVR and ventricular rates 130-140s  Relevant CV Studies: Echocardiogram in October 2019: Study Conclusions - Left ventricle: The cavity size was normal. Systolic function was normal. The estimated ejection fraction was in the range of 60% to 65%. Wall motion was normal; there were no regional wall motion abnormalities. Features are consistent with a pseudonormal left ventricular filling pattern, with concomitant abnormal relaxation and increased filling pressure (grade 2 diastolic dysfunction). - Mitral valve: Calcified annulus. Mildly thickened, mildly calcified leaflets . There was mild regurgitation. - Left atrium: The atrium was mildly to moderately dilated. - Tricuspid valve: There was moderate regurgitation. - Pulmonary arteries: Systolic pressure was moderately increased. PA peak pressure: 52 mm Hg (S).  Laboratory Data:  Chemistry Recent Labs  Lab 11/25/18 1904 11/26/18 0412  NA 140 141  K 4.4 4.1  CL 107 109  CO2 24 25  GLUCOSE 159* 159*  BUN 16 14  CREATININE 0.93 0.90    CALCIUM 8.4* 8.3*  GFRNONAA 56* 59*  GFRAA >60 >60  ANIONGAP 9 7    No results for input(s): PROT, ALBUMIN, AST, ALT, ALKPHOS, BILITOT in the last 168 hours. Hematology Recent Labs  Lab 11/25/18 1904 11/26/18 0412  WBC 9.3 7.8  RBC 3.98 3.76*  HGB 11.1* 10.3*  HCT 35.4* 33.6*  MCV 88.9 89.4  MCH 27.9 27.4  MCHC 31.4 30.7  RDW 16.5* 16.6*  PLT 276 244   Cardiac Enzymes Recent Labs  Lab 11/25/18 1904  TROPONINI 0.06*   No results for input(s): TROPIPOC in the last 168 hours.  BNP Recent Labs  Lab 11/25/18 1904  BNP 759.0*    DDimer No results for input(s): DDIMER in the last 168 hours.  Radiology/Studies:  Dg Chest 2 View  Result Date: 11/25/2018 CLINICAL DATA:  Atrial fibrillation EXAM: CHEST - 2 VIEW COMPARISON:  10/26/2018 FINDINGS: Chronic cardiomegaly. Interstitial coarsening compatible with vascular congestion. No Kerley lines or air bronchogram. No effusion or pneumothorax. Hiatal hernia. IMPRESSION: Cardiomegaly and vascular congestion. Electronically Signed   By: Monte Fantasia M.D.   On: 11/25/2018 19:00    Assessment and Plan:   Atrial flutter with rapid ventricular response - Symptomatic with shortness of breath, palpitations, feeling of rapid heart rate, and chest pressure. S/p cardioversion previous admission.  AV nodal blocking agents discontinued following cardioversion due to episodes of sinus bradycardia and junctional rhythm, compounded by brief runs of atrial tachycardia.  Seen by Dr. Caryl Comes with recommendation to continue anticoagulation but discontinue rate control and no plan for ablation at  that time. Last echo showed pulmonary HTN and as above.  -CHA2DS2VASc score of at least 8 (CHF, HTN, agex2, DM2, stroke x2, female). Anticoagulation with apixaban discontinued at this time given possibility of needing pacemaker due to continued sinus node dysfunction and atrial tachycardia.  Started on heparin given risk of thromboembolism, however, with history  of TIA. Daily CBC recommended. Monitor HR/BP. - Rate uncontrolled at this time, despite receiving Cardizem drip/amiodarone drip/digoxin/metoprolol.  Restarted PTA metoprolol succinate 25 mg.  Continue amiodarone drip.  Note that if decision is made for pacemaker, patient will need 48-hour beta-blocker/calcium channel blocker washout in preparation for this procedure. Could consider evaluation by Dr. Caryl Comes again this admission.  Chest pressure, Elevated Troponin - Current CP reported with troponin mildly elevated at 0.06. Continue to cycle until Tn peaks and down-trending. EKG without STE at this time. Will continue to trend Tn and monitor chest pressure. Consider elevation in the setting of RVR.   Acute on chronic HFpEF -Patient appears slightly volume overloaded on exam as above.  SOB reported and started on oxygen (not on home O2). Etiology likely d/t rapid ventricular rate. Weight increased from yesterday 91.6  95.1 kg.  Recommendation for daily standing weights, strict I's/O's.  Will restart PTA Lasix 20 mg daily and home potassium supplementation. Daily BMET recommended.  HTN - Currently controlled. Continue metoprolol, losartan, lasix.  DM2 - SSI, Per IM  Anemia - Chronic. Hgb stable at this time. Daily CBC recommended.    For questions or updates, please contact [place] Please consult [email or web address] for contact info under     Signed, Arvil Chaco, PA-C  11/26/2018 7:59 AM

## 2018-11-26 NOTE — Anesthesia Preprocedure Evaluation (Deleted)
Anesthesia Evaluation Anesthesia Physical Anesthesia Plan Anesthesia Quick Evaluation  

## 2018-11-26 NOTE — Anesthesia Preprocedure Evaluation (Signed)
Anesthesia Evaluation  Patient identified by MRN, date of birth, ID band Patient awake    Reviewed: Allergy & Precautions, NPO status , Patient's Chart, lab work & pertinent test results  History of Anesthesia Complications Negative for: history of anesthetic complications  Airway Mallampati: III  TM Distance: >3 FB Neck ROM: Full    Dental no notable dental hx.    Pulmonary shortness of breath, neg sleep apnea, neg COPD,    + rhonchi  (-) wheezing      Cardiovascular hypertension, + CAD and +CHF  (-) Past MI, (-) Cardiac Stents and (-) CABG + dysrhythmias Atrial Fibrillation  Rhythm:Regular Rate:Normal - Systolic murmurs and - Diastolic murmurs    Neuro/Psych neg Seizures PSYCHIATRIC DISORDERS Anxiety TIACVA    GI/Hepatic negative GI ROS, Neg liver ROS,   Endo/Other  diabetes, Oral Hypoglycemic AgentsHypothyroidism   Renal/GU Renal InsufficiencyRenal disease     Musculoskeletal negative musculoskeletal ROS (+)   Abdominal (+) + obese,   Peds  Hematology negative hematology ROS (+)   Anesthesia Other Findings Past Medical History: 07/17/2014: Benign paroxysmal positional vertigo No date: Chronic diastolic CHF (congestive heart failure) (H[facility])     Comment:  a. echo 10/19: EF of 60-65%, no RWMA, Gr2DD, calcified               mitral annulus with mild MR, mildly to moderately dilated              left atrium, moderate TR, PASP 52 mmHg No date: Diabetes mellitus No date: H/O: rheumatic fever No date: Hyperlipidemia No date: Hypertension No date: Pulmonary hypertension ([facility]) No date: Stroke [facility])     Comment:  TIA Jan. 1st No date: Teratoma of left ovary No date: Thyroid disease   Reproductive/Obstetrics                             Anesthesia Physical  Anesthesia Plan  ASA: III  Anesthesia Plan: General   Post-op Pain Management:    Induction: Intravenous  PONV Risk Score and  Plan: 2 and Propofol infusion  Airway Management Planned: Natural Airway  Additional Equipment:   Intra-op Plan:   Post-operative Plan:   Informed Consent: I have reviewed the patients History and Physical, chart, labs and discussed the procedure including the risks, benefits and alternatives for the proposed anesthesia with the patient or authorized representative who has indicated his/her understanding and acceptance.     Dental advisory given  Plan Discussed with: CRNA and Anesthesiologist  Anesthesia Plan Comments:         Anesthesia Quick Evaluation

## 2018-11-26 NOTE — Anesthesia Post-op Follow-up Note (Signed)
Anesthesia QCDR form completed.        

## 2018-11-26 NOTE — NC FL2 (Signed)
[place] LEVEL OF CARE SCREENING TOOL     IDENTIFICATION  Patient Name: Diane Mullins Birthdate: 09/18/1933 Sex: female Admission Date (Current Location): 11/25/2018  [place] and [place] Number:  Engineering geologist and Address:  [facility], [address], [place], [place] [address]      Provider Number: [id number]  Attending Physician Name and Address:  Gladstone Lighter, MD  Relative Name and Phone Number:       Current Level of Care: Hospital Recommended Level of Care: [place] Prior Approval Number:    Date Approved/Denied:   PASRR Number: [id number]  Discharge Plan: SNF    Current Diagnoses: Patient Active Problem List   Diagnosis Date Noted  . Atrial flutter ([place]) 10/17/2018  . 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 AKI (acute kidney injury) ([place])   . Acute on chronic diastolic heart failure ([place])   . Respiratory failure ([place]) 07/05/2018  . Atrial flutter with rapid ventricular response ([place]) 11/16/2017  . Coronary artery calcification seen on CAT scan 11/16/2017  . Morbid obesity ([place]) 10/30/2017  . (HFpEF) heart failure with preserved ejection fraction ([place]) 09/28/2017  . Family history of colon cancer 04/17/2017  . Closed right ankle fracture 10/28/2015  . Fatigue 10/18/2015  . TIA (transient ischemic attack) 09/18/2015  . Orthostatic hypotension 08/02/2015  . Adjustment disorder with anxious mood 03/30/2015  . Postmenopausal estrogen deficiency 06/03/2014  . DNR (do not resuscitate) discussion 03/04/2014  . Gait disturbance 05/07/2013  . Diabetes ([place]) 04/21/2013  . Shortness of breath 12/09/2012  . Hearing loss 12/09/2012  . Anxiety 01/31/2012  . Hypertension 12/22/2011  . Hyperlipidemia 12/22/2011  . Hypothyroidism 12/22/2011    Orientation RESPIRATION BLADDER Height & Weight     Self, Time, Situation, Place  O2(acute 2 Liters) Continent Weight: 95.1 kg Height:  5\' 1"  (154.9 cm)   BEHAVIORAL SYMPTOMS/MOOD NEUROLOGICAL BOWEL NUTRITION STATUS      Continent Diet(heart healthy)  AMBULATORY STATUS COMMUNICATION OF NEEDS Skin   Limited Assist Verbally Normal                       Personal Care Assistance Level of Assistance  Bathing, Dressing Bathing Assistance: Limited assistance   Dressing Assistance: Limited assistance     Functional Limitations Info  Sight(wears glasses) Sight Info: Adequate        SPECIAL CARE FACTORS FREQUENCY  PT (By licensed PT), Diabetic urine testing     PT Frequency: daily              Contractures Contractures Info: Not present    Additional Factors Info  Code Status, Insulin Sliding Scale, Allergies Code Status Info: full code Allergies Info: oxycodone           Current Medications (11/26/2018):  This is the current hospital active medication list Current Facility-Administered Medications  Medication Dose Route Frequency Provider Last Rate Last Dose  . 0.9 %  sodium chloride infusion  250 mL Intravenous Continuous Minna Merritts, MD 1 mL/hr at 11/26/18 1018    . acetaminophen (TYLENOL) tablet 650 mg  650 mg Oral Q6H PRN Lance Coon, MD       Or  . acetaminophen (TYLENOL) suppository 650 mg  650 mg Rectal Q6H PRN Lance Coon, MD      . amiodarone (PACERONE) tablet 200 mg  200 mg Oral TID Minna Merritts, MD   200 mg at 11/26/18 1202  . apixaban (ELIQUIS) tablet 5  mg  5 mg Oral BID Gladstone Lighter, MD   5 mg at 11/26/18 0945  . aspirin EC tablet 81 mg  81 mg Oral Daily Lance Coon, MD   81 mg at 11/26/18 4287  . furosemide (LASIX) tablet 20 mg  20 mg Oral Daily Visser, Jacquelyn D, PA-C      . insulin aspart (novoLOG) injection 0-5 Units  0-5 Units Subcutaneous QHS Lance Coon, MD      . insulin aspart (novoLOG) injection 0-9 Units  0-9 Units Subcutaneous TID WC Lance Coon, MD      . insulin glargine (LANTUS) injection 10 Units  10 Units Subcutaneous Daily Gladstone Lighter, MD      .  levothyroxine (SYNTHROID, LEVOTHROID) tablet 75 mcg  75 mcg Oral Daily Lance Coon, MD   75 mcg at 11/26/18 0533  . metoprolol succinate (TOPROL-XL) 24 hr tablet 25 mg  25 mg Oral Daily Marrianne Mood D, PA-C   25 mg at 11/26/18 6811  . ondansetron (ZOFRAN) tablet 4 mg  4 mg Oral Q6H PRN Lance Coon, MD       Or  . ondansetron [facility]) injection 4 mg  4 mg Intravenous Q6H PRN Lance Coon, MD      . pantoprazole (PROTONIX) EC tablet 40 mg  40 mg Oral Daily Lance Coon, MD   40 mg at 11/26/18 0843  . potassium chloride (K-DUR,KLOR-CON) CR tablet 10 mEq  10 mEq Oral Daily Visser, Jacquelyn D, PA-C      . simvastatin (ZOCOR) tablet 40 mg  40 mg Oral QPM Lance Coon, MD      . sodium chloride flush (NS) 0.9 % injection 3 mL  3 mL Intravenous Q12H Gollan, Kathlene November, MD      . sodium chloride flush (NS) 0.9 % injection 3 mL  3 mL Intravenous PRN Rockey Situ, Kathlene November, MD         Discharge Medications: Please see discharge summary for a list of discharge medications.  Relevant Imaging Results:  Relevant Lab Results:   Additional Information ss# [id number]  Marshell Garfinkel, RN

## 2018-11-26 NOTE — Progress Notes (Signed)
[place] for heparin Indication: atrial fibrillation  Allergies  Allergen Reactions  . Oxycodone     Other reaction(s): Confusion Patient's daughter reported confusion/sensitivity to oxycodone.     Patient Measurements: Height: 5\' 1"  (154.9 cm) Weight: 209 lb 11.2 oz (95.1 kg) IBW/kg (Calculated) : 47.8 Heparin Dosing Weight: 70.36 kg  Vital Signs: Temp: 97.6 F (36.4 C) (03/10 0317) Temp Source: Oral (03/10 0031) BP: 113/81 (03/10 0838) Pulse Rate: 130 (03/10 0504)  Labs: Recent Labs    11/25/18 1904 11/26/18 0412  HGB 11.1* 10.3*  HCT 35.4* 33.6*  PLT 276 244  CREATININE 0.93 0.90  TROPONINI 0.06*  --     Estimated Creatinine Clearance: 49 mL/min (by C-G formula based on SCr of 0.9 mg/dL).   Medical History: Past Medical History:  Diagnosis Date  . Atrial fibrillation ([place])   . Benign paroxysmal positional vertigo 07/17/2014  . Chronic diastolic CHF (congestive heart failure) ([place])    a. echo 10/19: EF of 60-65%, no RWMA, Gr2DD, calcified mitral annulus with mild MR, mildly to moderately dilated left atrium, moderate TR, PASP 52 mmHg  . Diabetes mellitus   . H/O: rheumatic fever   . Hyperlipidemia   . Hypertension   . Pulmonary hypertension ([place])   . Stroke [facility])    TIA Jan. 1st  . Teratoma of left ovary   . Thyroid disease     Medications:  Scheduled:  . apixaban  5 mg Oral BID  . aspirin EC  81 mg Oral Daily  . furosemide  20 mg Oral Daily  . insulin aspart  0-5 Units Subcutaneous QHS  . insulin aspart  0-9 Units Subcutaneous TID WC  . levothyroxine  75 mcg Oral Daily  . metoprolol succinate  25 mg Oral Daily  . pantoprazole  40 mg Oral Daily  . potassium chloride  10 mEq Oral Daily  . simvastatin  40 mg Oral QPM   Infusions:  . amiodarone 60 mg/hr (11/26/18 0841)   PRN: acetaminophen **OR** acetaminophen, ondansetron **OR** ondansetron (ZOFRAN) IV  Assessment: Pharmacy was consulted for heparin.  Patient was on apixaban PTA. Will discontinue apixaban and start heparin due to possible pacemaker. Patient's last apixaban dose was at 0135 on 3/10. Will initiate heparin drip approximately 12 hours after, at 1330. Will not give bolus due to apixaban dose given.  Goal of Therapy:  aPTT 66-102 seconds Monitor platelets by anticoagulation protocol: Yes   Plan:  Start heparin infusion at 950 units/hr Check aPTT level in 8 hours and daily while on heparin Continue to monitor H&H and platelets  Sharise Lippy A Naveya Ellerman 11/26/2018,8:59 AM

## 2018-11-26 NOTE — Transfer of Care (Signed)
Immediate Anesthesia Transfer of Care Note  Patient: Diane Mullins  Procedure(s) Performed: CARDIOVERSION (CATH LAB) (N/A )  Patient Location: PACU  Anesthesia Type:General  Level of Consciousness: sedated  Airway & Oxygen Therapy: Patient Spontanous Breathing and Patient connected to nasal cannula oxygen  Post-op Assessment: Report given to RN and Post -op Vital signs reviewed and stable  Post vital signs: Reviewed and stable  Last Vitals:  Vitals Value Taken Time  BP    Temp    Pulse    Resp    SpO2      Last Pain:  Vitals:   11/26/18 1014  TempSrc:   PainSc: 0-No pain         Complications: No apparent anesthesia complications

## 2018-11-26 NOTE — TOC Initial Note (Addendum)
Transition of Care [facility]) - Initial/Assessment Note    Patient Details  Name: Diane Mullins MRN: [id number] Date of Birth: [birth year]  Transition of [facility]) CM/SW Contact:    Marshell Garfinkel, RN Phone Number: 11/26/2018, 9:32 AM  Clinical Narrative:                 Patient presents in observation bed status after home physical therapist noted patient heart rate 146 beats per minute. She lives at independent living at [facility]. Ambulates at baseline with Rollator walker.  [facility]. She has a PCP appointment for Thursday 11/28/18 at 1130AM and cardiologist appointment with Thurmond Butts PA on 12/12/18.  Home health list provided to patient. MOON letter explained and also delivered to patient. O2 is acute; Eliquis voucher provided.  Patient has been home for 2 weeks after a 2 week rehab stay at [facility]. RN updated that we need PT when safe.  Patient is interested in returning to [facility] at discharge.  I spoke with Baltazar Najjar at [facility] to update. Patient used around 20 days. MD updated.    Expected Discharge Plan: [place] Barriers to Discharge: No Barriers Identified   Patient Goals and CMS Choice Patient states their goals for this hospitalization and ongoing recovery are:: "I hope to return to [facility]" CMS Medicare.gov Compare Post Acute Care list provided to:: Patient Choice offered to / list presented to : Patient  Expected Discharge Plan and Services Expected Discharge Plan: [place] Discharge Planning Services: CM Consult, Medication Assistance Post Acute Care Choice: Durable Medical Equipment, Home Health Living arrangements for the past 2 months: Apartment Expected Discharge Date: 11/27/18                        Prior Living Arrangements/Services Living arrangements for the past 2 months: Apartment Lives with:: Self Patient language and need for interpreter reviewed:: No Do you feel safe going back  to the place where you live?: Yes      Need for Family Participation in Patient Care: Yes (Comment)(to help understand treatment plan) Care giver support system in place?: Yes (comment) Current home services: DME, Home PT(Rollator walker) Criminal Activity/Legal Involvement Pertinent to Current Situation/Hospitalization: No - Comment as needed  Activities of Daily Living Home Assistive Devices/Equipment: Walker (specify type), Eyeglasses, Grab bars around toilet, Grab bars in shower, Built-in shower seat ADL Screening (condition at time of admission) Patient's cognitive ability adequate to safely complete daily activities?: Yes Is the patient deaf or have difficulty hearing?: No Does the patient have difficulty seeing, even when wearing glasses/contacts?: No Does the patient have difficulty concentrating, remembering, or making decisions?: No Patient able to express need for assistance with ADLs?: Yes Does the patient have difficulty dressing or bathing?: No Independently performs ADLs?: Yes (appropriate for developmental age) Does the patient have difficulty walking or climbing stairs?: Yes Weakness of Legs: None Weakness of Arms/Hands: None  Permission Sought/Granted Permission sought to share information with : Case Manager, PCP Permission granted to share information with : Yes, Verbal Permission Granted  Share Information with NAME: Dr Josephina Gip     Permission granted to share info w Relationship: Daughter Shirlean Mylar     Emotional Assessment Appearance:: Appears stated age   Affect (typically observed): Afraid/Fearful(COVID 19 ) Orientation: : Oriented to Self, Oriented to Place, Oriented to  Time, Oriented to Situation      Admission  diagnosis:  Atrial flutter, unspecified type [facility]) [I48.92] Patient Active Problem List   Diagnosis Date Noted  . Atrial flutter ([place]) 10/17/2018  . 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 AKI (acute kidney injury) ([place])   . Acute on chronic  diastolic heart failure ([place])   . Respiratory failure ([place]) 07/05/2018  . Atrial flutter with rapid ventricular response ([place]) 11/16/2017  . Coronary artery calcification seen on CAT scan 11/16/2017  . Morbid obesity ([place]) 10/30/2017  . (HFpEF) heart failure with preserved ejection fraction ([place]) 09/28/2017  . Family history of colon cancer 04/17/2017  . Closed right ankle fracture 10/28/2015  . Fatigue 10/18/2015  . TIA (transient ischemic attack) 09/18/2015  . Orthostatic hypotension 08/02/2015  . Adjustment disorder with anxious mood 03/30/2015  . Postmenopausal estrogen deficiency 06/03/2014  . DNR (do not resuscitate) discussion 03/04/2014  . Gait disturbance 05/07/2013  . Diabetes ([place]) 04/21/2013  . Shortness of breath 12/09/2012  . Hearing loss 12/09/2012  . Anxiety 01/31/2012  . Hypertension 12/22/2011  . Hyperlipidemia 12/22/2011  . Hypothyroidism 12/22/2011   PCP:  [name], MD Pharmacy[facility]   [id number] [place], [place] [address] [place] [address] Phone: [phone] Fax: [phone] [facility]) - [place], [place] [address] Phone: [phone] Fax: [phone]     Social Determinants of Health (SDOH) Interventions    Readmission Risk Interventions 30 Day Unplanned Readmission Risk Score     ED to Hosp-Admission (Discharged) from 10/17/2018 in [place] (2A)  30 Day Unplanned Readmission Risk Score (%)  17 Filed at 10/28/2018 1600     This score is the patient's risk of an unplanned readmission within 30 days of being discharged (0 -100%). The score is based on dignosis, age, lab data, medications, orders, and past utilization.   Low:  0-14.9   Medium: 15-21.9   High: 22-29.9   Extreme: 30 and above       Readmission Risk Prevention Plan 11/26/2018  Transportation Screening Complete  PCP or  Specialist Appt within 5-7 Days Complete  Home Care Screening Complete  Medication Review (RN CM) Complete  Some recent data might be hidden

## 2018-11-26 NOTE — Progress Notes (Signed)
Patient was admitted for atrial fib with rvr. Patient is currently on a amiodarone drip. Patient has received digoxin in addition to the drip.

## 2018-11-27 ENCOUNTER — Inpatient Hospital Stay: Payer: PPO

## 2018-11-27 ENCOUNTER — Inpatient Hospital Stay: Payer: PPO | Admitting: Family Medicine

## 2018-11-27 ENCOUNTER — Other Ambulatory Visit: Payer: Self-pay

## 2018-11-27 DIAGNOSIS — I10 Essential (primary) hypertension: Secondary | ICD-10-CM | POA: Diagnosis not present

## 2018-11-27 DIAGNOSIS — I4892 Unspecified atrial flutter: Secondary | ICD-10-CM | POA: Diagnosis not present

## 2018-11-27 DIAGNOSIS — E119 Type 2 diabetes mellitus without complications: Secondary | ICD-10-CM | POA: Diagnosis not present

## 2018-11-27 DIAGNOSIS — I5030 Unspecified diastolic (congestive) heart failure: Secondary | ICD-10-CM | POA: Diagnosis not present

## 2018-11-27 LAB — CBC WITH DIFFERENTIAL/PLATELET
Abs Immature Granulocytes: 0.03 10*3/uL (ref 0.00–0.07)
Basophils Absolute: 0.1 10*3/uL (ref 0.0–0.1)
Basophils Relative: 1 %
Eosinophils Absolute: 0.1 10*3/uL (ref 0.0–0.5)
Eosinophils Relative: 1 %
HCT: 34.4 % — ABNORMAL LOW (ref 36.0–46.0)
Hemoglobin: 10.7 g/dL — ABNORMAL LOW (ref 12.0–15.0)
Immature Granulocytes: 0 %
Lymphocytes Relative: 19 %
Lymphs Abs: 1.9 10*3/uL (ref 0.7–4.0)
MCH: 27.6 pg (ref 26.0–34.0)
MCHC: 31.1 g/dL (ref 30.0–36.0)
MCV: 88.7 fL (ref 80.0–100.0)
Monocytes Absolute: 1 10*3/uL (ref 0.1–1.0)
Monocytes Relative: 10 %
Neutro Abs: 7.1 10*3/uL (ref 1.7–7.7)
Neutrophils Relative %: 69 %
Platelets: 257 10*3/uL (ref 150–400)
RBC: 3.88 MIL/uL (ref 3.87–5.11)
RDW: 16.3 % — AB (ref 11.5–15.5)
WBC: 10.1 10*3/uL (ref 4.0–10.5)
nRBC: 0 % (ref 0.0–0.2)

## 2018-11-27 LAB — BASIC METABOLIC PANEL
Anion gap: 7 (ref 5–15)
BUN: 12 mg/dL (ref 8–23)
CALCIUM: 8.6 mg/dL — AB (ref 8.9–10.3)
CO2: 28 mmol/L (ref 22–32)
Chloride: 104 mmol/L (ref 98–111)
Creatinine, Ser: 0.88 mg/dL (ref 0.44–1.00)
GFR calc Af Amer: >60 mL/min (ref >60)
GFR calc non Af Amer: >60 mL/min (ref >60)
Glucose, Bld: 140 mg/dL — ABNORMAL HIGH (ref 70–99)
Potassium: 4.4 mmol/L (ref 3.5–5.1)
Sodium: 139 mmol/L (ref 135–145)

## 2018-11-27 LAB — GLUCOSE, CAPILLARY
GLUCOSE-CAPILLARY: 209 mg/dL — AB (ref 70–99)
Glucose-Capillary: 128 mg/dL — ABNORMAL HIGH (ref 70–99)
Glucose-Capillary: 202 mg/dL — ABNORMAL HIGH (ref 70–99)
Glucose-Capillary: 153 mg/dL — ABNORMAL HIGH (ref 70–99)

## 2018-11-27 MED ORDER — FUROSEMIDE 10 MG/ML IJ SOLN: 40.0000 mg | Freq: Two times a day (BID) | INTRAMUSCULAR | Status: DC

## 2018-11-27 MED ORDER — FUROSEMIDE 10 MG/ML IJ SOLN
40.0000 mg | Freq: Two times a day (BID) | INTRAMUSCULAR | Status: DC
  Administered 2018-11-27: 40 mg via INTRAVENOUS

## 2018-11-27 MED ORDER — FUROSEMIDE 10 MG/ML IJ SOLN
40.0000 mg | Freq: Two times a day (BID) | INTRAMUSCULAR | Status: DC
  Administered 2018-11-27 – 2018-11-28 (×2): 40 mg via INTRAVENOUS
  Filled 2018-11-27 (×2): qty 4

## 2018-11-27 NOTE — Care Management (Addendum)
Patient is current with [facility] patient [facility].  She states that [facility] is part of [facility] and would like to use them if any other services are needed.  If patient only needs PT, [facility] can assist.  Referral sent to [place] with [facility]. Per daughter, patient's insurance is NOT in network with outpatient [facility] and she would like to have home health through [facility]. Please consider heart failure/ReDS vest problem.

## 2018-11-27 NOTE — Plan of Care (Signed)
Patient was awakened with shortness of breath. Applied 2 liters of oxygen. Patient is resting with HOB at 20 degrees.

## 2018-11-27 NOTE — Progress Notes (Signed)
  Amiodarone Drug - Drug Interaction Consult Note  Recommendations: Patient was previously on Simvastatin 40mg . Changed to 20mg  dose per protocol.  Amiodarone is metabolized by the cytochrome P450 system and therefore has the potential to cause many drug interactions. Amiodarone has an average plasma half-life of 50 days (range 20 to 100 days).   There is potential for drug interactions to occur several weeks or months after stopping treatment and the onset of drug interactions may be slow after initiating amiodarone.   [x]  Statins: Increased risk of myopathy. Simvastatin- restrict dose to 20mg  daily. Other statins: counsel patients to report any muscle pain or weakness immediately.  []  Anticoagulants: Amiodarone can increase anticoagulant effect. Consider warfarin dose reduction. Patients should be monitored closely and the dose of anticoagulant altered accordingly, remembering that amiodarone levels take several weeks to stabilize.  []  Antiepileptics: Amiodarone can increase plasma concentration of phenytoin, the dose should be reduced. Note that small changes in phenytoin dose can result in large changes in levels. Monitor patient and counsel on signs of toxicity.  []  Beta blockers: increased risk of bradycardia, AV block and myocardial depression. Sotalol - avoid concomitant use.  []   Calcium channel blockers (diltiazem and verapamil): increased risk of bradycardia, AV block and myocardial depression.  []   Cyclosporine: Amiodarone increases levels of cyclosporine. Reduced dose of cyclosporine is recommended.  []  Digoxin dose should be halved when amiodarone is started.  []  Diuretics: increased risk of cardiotoxicity if hypokalemia occurs.  []  Oral hypoglycemic agents (glyburide, glipizide, glimepiride): increased risk of hypoglycemia. Patient's glucose levels should be monitored closely when initiating amiodarone therapy.   [x]  Drugs that prolong the QT interval:  Torsades de pointes  risk may be increased with concurrent use - avoid if possible.  Monitor QTc, also keep magnesium/potassium WNL if concurrent therapy can't be avoided. Marland Kitchen Antibiotics: e.g. fluoroquinolones, erythromycin. . Antiarrhythmics: e.g. quinidine, procainamide, disopyramide, sotalol. . Antipsychotics: e.g. phenothiazines, haloperidol.  . Lithium, tricyclic antidepressants, and methadone.  Patient is currently ordered Ondansetron. Using caution. Can potentially prolong QT interval. Thank You,  Pearla Dubonnet  11/27/2018 1:52 PM

## 2018-11-27 NOTE — Patient Outreach (Signed)
[facility]) Care Management  11/27/2018  Diane Mullins 03-25-1933 [id number]   Patient admitted on 11/25/2018 to [facility] for Atrial Flutter. RN CM will close case at this time.    Jone Baseman, RN, MSN [place] Management Care Management Coordinator Direct Line [phone] Cell [phone] Toll Free: [phone]  Fax: [phone]

## 2018-11-27 NOTE — Progress Notes (Signed)
[place] at [place] NAME: Diane Mullins    MR#:  [id number]  DATE OF BIRTH:  [birth year]  SUBJECTIVE:  CHIEF COMPLAINT:   Chief Complaint  Patient presents with  . Atrial Fibrillation   -Status post cardioversion.  Remains in sinus rhythm -Last night patient had an episode of shortness of breath, had to sleep in a recliner.  Started on IV Lasix this morning  REVIEW OF SYSTEMS:  Review of Systems  Constitutional: Negative for chills, fever and malaise/fatigue.  HENT: Positive for hearing loss. Negative for congestion, ear discharge and nosebleeds.   Eyes: Negative for blurred vision and double vision.  Respiratory: Positive for shortness of breath. Negative for cough and wheezing.   Cardiovascular: Negative for chest pain, palpitations and leg swelling.  Gastrointestinal: Negative for abdominal pain, constipation, diarrhea, nausea and vomiting.  Genitourinary: Negative for dysuria.  Musculoskeletal: Negative for myalgias.  Neurological: Negative for dizziness, focal weakness, seizures, weakness and headaches.  Psychiatric/Behavioral: Negative for depression.    DRUG ALLERGIES:   Allergies  Allergen Reactions  . Oxycodone     Other reaction(s): Confusion Patient's daughter reported confusion/sensitivity to oxycodone.     VITALS:  Blood pressure (!) 163/64, pulse 70, temperature 98 F (36.7 C), temperature source Oral, resp. rate 16, height 5\' 1"  (1.549 m), weight 93.6 kg, SpO2 94 %.  PHYSICAL EXAMINATION:  Physical Exam   GENERAL:  85 y.o.-year-old elderly patient lying in the bed with no acute distress.  EYES: Pupils equal, round, reactive to light and accommodation. No scleral icterus. Extraocular muscles intact.  HEENT: Head atraumatic, normocephalic. Oropharynx and nasopharynx clear.  NECK:  Supple, no jugular venous distention. No thyroid enlargement, no tenderness.  LUNGS: Normal breath sounds bilaterally, no wheezing  rhonchi or crepitation. No use of accessory muscles of respiration. Bibasilar crackles. CARDIOVASCULAR: S1, S2 rapid and irregular. No  rubs, or gallops. 3/6 systolic murmur present ABDOMEN: Soft, nontender, nondistended. Bowel sounds present. No organomegaly or mass.  EXTREMITIES: No  cyanosis, or clubbing. 1+ pedal edema NEUROLOGIC: Cranial nerves II through XII are intact. Muscle strength 5/5 in all extremities. Sensation intact. Gait not checked. Global weakness noted. PSYCHIATRIC: The patient is alert and oriented x 3.  SKIN: No obvious rash, lesion, or ulcer.    LABORATORY PANEL:   CBC Recent Labs  Lab 11/27/18 0415  WBC 10.1  HGB 10.7*  HCT 34.4*  PLT 257   ------------------------------------------------------------------------------------------------------------------  Chemistries  Recent Labs  Lab 11/27/18 0415  NA 139  K 4.4  CL 104  CO2 28  GLUCOSE 140*  BUN 12  CREATININE 0.88  CALCIUM 8.6*   ------------------------------------------------------------------------------------------------------------------  Cardiac Enzymes Recent Labs  Lab 11/26/18 1513  TROPONINI 0.04*   ------------------------------------------------------------------------------------------------------------------  RADIOLOGY:  Dg Chest 2 View  Result Date: 11/27/2018 CLINICAL DATA:  CHF EXAM: CHEST - 2 VIEW COMPARISON:  11/25/2018 FINDINGS: Cardiomegaly and generalized interstitial coarsening that is stable. Trace pleural effusions. No pneumothorax. IMPRESSION: Cardiomegaly and vascular congestion with trace pleural fluid. Electronically Signed   By: Monte Fantasia M.D.   On: 11/27/2018 10:23   Dg Chest 2 View  Result Date: 11/25/2018 CLINICAL DATA:  Atrial fibrillation EXAM: CHEST - 2 VIEW COMPARISON:  10/26/2018 FINDINGS: Chronic cardiomegaly. Interstitial coarsening compatible with vascular congestion. No Kerley lines or air bronchogram. No effusion or pneumothorax. Hiatal  hernia. IMPRESSION: Cardiomegaly and vascular congestion. Electronically Signed   By: Monte Fantasia M.D.   On: 11/25/2018 19:00  EKG:   Orders placed or performed during the hospital encounter of 11/25/18  . EKG 12-Lead  . EKG 12-Lead  . ED EKG  . ED EKG  . EKG 12-Lead  . EKG 12-Lead  . EKG 12-Lead  . EKG 12-Lead    ASSESSMENT AND PLAN:   85 year old female with past medical history significant for atrial flutter/fibrillation status post cardioversion last month on Eliquis, chronic diastolic heart failure, diabetes, hypertension, history of TIA, anemia of chronic disease and COPD not on home oxygen presents to hospital secondary to palpitations and noted to have atrial flutter again.  1.  Recurrent atrial flutter with RVR-was here last month for the same complaints and had cardioversion successfully. -On Eliquis for anticoagulation. -s/p cardioversion again this admission- in sinus rhythm now - off amiodarone drip and started PO amiodarone. -Appreciate cardiology consult. - continue low-dose Toprol by mouth  2.  Acute on chronic diastolic CHF-likely triggered secondary to elevated rate from atrial flutter -Received 1 dose of IV Lasix during cardioversion. -continue IV lasix again today - If improving- will discharge on oral lasix - off oxygen this AM  3.  Hypokalemia-replaced  4.  Hypothyroidism-Synthroid  5.  diabetes mellitus- low-dose Lantus and sliding scale insulin  6.  DVT prophylaxis-on Eliquis  Patient recently got out of [facility] rehab. PT recommended home health Patient is from [facility] independent living  Possible discharge later today or tomorrow   All the records are reviewed and case discussed with Care Management/Social Workerr. Management plans discussed with the patient, family and they are in agreement.  CODE STATUS: Full Code  TOTAL TIME TAKING CARE OF THIS PATIENT: 37 minutes.   POSSIBLE D/C IN 1-2 DAYS, DEPENDING ON CLINICAL  CONDITION.   Gladstone Lighter M.D on 11/27/2018 at 11:55 AM  Between 7am to 6pm - Pager - [phone]  After 6pm go to [email or web address] - password [email or web address] [facility]  Office  [phone]  CC: Primary care physician; [name], MD

## 2018-11-27 NOTE — Progress Notes (Addendum)
Progress Note  Patient Name: Diane Mullins Date of Encounter: 11/27/2018  Primary Cardiologist: Ida Rogue, MD  Subjective   S/p DCCV on 3/10. Maintaining sinus rhythm.  Became dyspneic after getting up to the bathroom @ around 1am.  O2 via [place] didn't seem to help much.  Better with sitting up in chair.  No dyspnea this AM.  Inpatient Medications    Scheduled Meds: . amiodarone  200 mg Oral TID  . apixaban  5 mg Oral BID  . aspirin EC  81 mg Oral Daily  . furosemide  40 mg Intravenous BID  . insulin aspart  0-5 Units Subcutaneous QHS  . insulin aspart  0-9 Units Subcutaneous TID WC  . insulin glargine  10 Units Subcutaneous Daily  . levothyroxine  75 mcg Oral Daily  . metoprolol succinate  25 mg Oral Daily  . pantoprazole  40 mg Oral Daily  . potassium chloride  10 mEq Oral Daily  . simvastatin  20 mg Oral QPM  . sodium chloride flush  10 mL Intravenous Q12H   Continuous Infusions:  PRN Meds: acetaminophen **OR** acetaminophen, ondansetron **OR** ondansetron (ZOFRAN) IV   Vital Signs    Vitals:   11/26/18 1211 11/26/18 1547 11/26/18 1934 11/27/18 0425  BP: (!) 102/54 (!) 107/51 (!) 147/57 137/74  Pulse:  64 68 68  Resp:  19 17 16   Temp:  98.1 F (36.7 C) (!) 97.5 F (36.4 C) 97.7 F (36.5 C)  TempSrc:  Oral Oral Oral  SpO2: 94% 98% 91% 99%  Weight:    93.6 kg  Height:        Intake/Output Summary (Last 24 hours) at 11/27/2018 0926 Last data filed at 11/27/2018 0200 Gross per 24 hour  Intake 610.07 ml  Output 1750 ml  Net -1139.93 ml   Filed Weights   11/25/18 1832 11/26/18 0031 11/27/18 0425  Weight: 91.6 kg 95.1 kg 93.6 kg    Physical Exam   GEN: Well nourished, well developed, in no acute distress.  HEENT: Grossly normal.  Neck: Supple, obese, difficult to gauge JVP, no carotid bruits, or masses. Cardiac: RRR, no murmurs, rubs, or gallops. No clubbing, cyanosis, edema.  Radials/DP/PT 2+ and equal bilaterally.  Respiratory:  Respirations  regular and unlabored, bibasilar crackles. GI: Soft, nontender, nondistended, BS + x 4. MS: no deformity or atrophy. Skin: warm and dry, no rash. Neuro:  Strength and sensation are intact. Psych: AAOx3.  Normal affect.  Labs    Chemistry Recent Labs  Lab 11/25/18 1904 11/26/18 0412 11/27/18 0415  NA 140 141 139  K 4.4 4.1 4.4  CL 107 109 104  CO2 24 25 28   GLUCOSE 159* 159* 140*  BUN 16 14 12   CREATININE 0.93 0.90 0.88  CALCIUM 8.4* 8.3* 8.6*  GFRNONAA 56* 59* >60  GFRAA >60 >60 >60  ANIONGAP 9 7 7      Hematology Recent Labs  Lab 11/26/18 0412 11/26/18 0922 11/27/18 0415  WBC 7.8 8.2 10.1  RBC 3.76* 4.05 3.88  HGB 10.3* 11.1* 10.7*  HCT 33.6* 35.8* 34.4*  MCV 89.4 88.4 88.7  MCH 27.4 27.4 27.6  MCHC 30.7 31.0 31.1  RDW 16.6* 16.5* 16.3*  PLT 244 263 257    Cardiac Enzymes Recent Labs  Lab 11/25/18 1904 11/26/18 0922 11/26/18 1513  TROPONINI 0.06* 0.05* 0.04*    BNP Recent Labs  Lab 11/25/18 1904  BNP 759.0*      Radiology    Dg Chest 2 View  Result Date: 11/25/2018 CLINICAL DATA:  Atrial fibrillation EXAM: CHEST - 2 VIEW COMPARISON:  10/26/2018 FINDINGS: Chronic cardiomegaly. Interstitial coarsening compatible with vascular congestion. No Kerley lines or air bronchogram. No effusion or pneumothorax. Hiatal hernia. IMPRESSION: Cardiomegaly and vascular congestion. Electronically Signed   By: Monte Fantasia M.D.   On: 11/25/2018 19:00    Telemetry    Sinus rhythm, occasional 2 sec pauses. No recurrent atrial flutter - Personally Reviewed  Cardiac Studies   Transesophageal Echocardiogram 2.4.2020  Study Conclusions   - Limited study to exclude intraatrial thrombus. - Mitral valve: There was mild to moderate regurgitation directed   eccentrically. - Left atrium: No evidence of thrombus in the atrial cavity or   appendage. No evidence of thrombus in the atrial cavity or   appendage. - Right atrium: No evidence of thrombus in the atrial  cavity or   appendage. - Atrial septum: Doppler showed no atrial level shunt.   Impressions:   - Successful cardioversion. No cardiac source of emboli was   indentified. _____________   Direct Current Cardioversion 3.10.2020  Cardioverted 1 time(s).   Cardioverted at  150 J. Synchronized biphasic Converted to NSR   Patient Profile     85 y.o. female w/ a h/o atrial flutter s/p TEE/DCCV 10/22/2018, HFpEF, DMII, HTN, HL, anemia, COPD, vertigo, and prior CVA/TIA, who was admitted 3/9 secondary to recurrent rapid atrial flutter and CHF.  Assessment & Plan    1.  AFlutter w/ RVR:  S/p TEE DCCV 10/2018 - presented 3/9 w/ recurrent tachycardia and dyspnea and was found to be in rapid atrial flutter  IV amio added w/ minimal improvement requiring DCCV on 3/10.  She has since maintained sinus rhythm and amio has been converted to oral @ 200mg  TID in an effort to prevent bradycardia as HRs trending in 60's w/ intermittent 2 sec pauses.  Cont eliquis (CHA2DS2VASC = 8) and low dose  blocker.  Will plan to cont amio @ current dose for ~ 2 wks and then will reduce down to BID in outpt setting.  Prev eval by Dr. Caryl Comes for consideration of catheter ablation but b/c of long term need for [facility] in setting of prior stroke, and high likelihood that she may also have Afib, it was not felt that ablation was appropriate. D/c asa.  2.  Acute on chronic diastolic CHF: EF 83-38% by echo in 06/2018.  In setting of above, pt w/ pulmonary edema req IV lasix yesterday.  She was then placed on oral lasix but last night had dyspnea after getting up to the bathroom.  She cont to have bibasilar crackles on exam this AM and wt remains up a kg from prior discharge in Feb.  Will diurese today w/ lasix 40 IV bid.  Renal fxn ok. HR/BP stable.    3.  Essential HTN:  Stable.  Will have to watch for hypotension w/ diuresis.  4.  DMII:  a1c 9.6 in Jan. Insulin mgmt per IM.  5.  Hypothyroidism:  On replacement.  TSH 1.862 in Jan.   6.  Normocytic anemia:  Stable.  Will d/c ASA in setting of chronic eliquis.  7.  HL:  LDL 74 in Jan. Cont statin rx.  Signed, Murray Hodgkins, NP  11/27/2018, 9:26 AM    For questions or updates, please contact   Please consult [email or web address] for contact info under Cardiology/STEMI.

## 2018-11-28 ENCOUNTER — Inpatient Hospital Stay: Payer: PPO | Admitting: Family Medicine

## 2018-11-28 DIAGNOSIS — I4892 Unspecified atrial flutter: Secondary | ICD-10-CM | POA: Diagnosis not present

## 2018-11-28 DIAGNOSIS — I5030 Unspecified diastolic (congestive) heart failure: Secondary | ICD-10-CM | POA: Diagnosis not present

## 2018-11-28 DIAGNOSIS — I10 Essential (primary) hypertension: Secondary | ICD-10-CM | POA: Diagnosis not present

## 2018-11-28 DIAGNOSIS — E119 Type 2 diabetes mellitus without complications: Secondary | ICD-10-CM | POA: Diagnosis not present

## 2018-11-28 LAB — BASIC METABOLIC PANEL
ANION GAP: 7 (ref 5–15)
BUN: 12 mg/dL (ref 8–23)
CO2: 30 mmol/L (ref 22–32)
Calcium: 8.6 mg/dL — ABNORMAL LOW (ref 8.9–10.3)
Chloride: 106 mmol/L (ref 98–111)
Creatinine, Ser: 0.99 mg/dL (ref 0.44–1.00)
GFR calc Af Amer: >60 mL/min (ref >60)
GFR calc non Af Amer: 52 mL/min — ABNORMAL LOW (ref >60)
Glucose, Bld: 147 mg/dL — ABNORMAL HIGH (ref 70–99)
Potassium: 3.8 mmol/L (ref 3.5–5.1)
Sodium: 143 mmol/L (ref 135–145)

## 2018-11-28 LAB — CBC WITH DIFFERENTIAL/PLATELET
Abs Immature Granulocytes: 0.01 10*3/uL (ref 0.00–0.07)
Basophils Absolute: 0.1 10*3/uL (ref 0.0–0.1)
Basophils Relative: 1 %
Eosinophils Absolute: 0.1 10*3/uL (ref 0.0–0.5)
Eosinophils Relative: 2 %
HCT: 34.4 % — ABNORMAL LOW (ref 36.0–46.0)
Hemoglobin: 10.6 g/dL — ABNORMAL LOW (ref 12.0–15.0)
IMMATURE GRANULOCYTES: 0 %
Lymphocytes Relative: 22 %
Lymphs Abs: 1.6 10*3/uL (ref 0.7–4.0)
MCH: 27.5 pg (ref 26.0–34.0)
MCHC: 30.8 g/dL (ref 30.0–36.0)
MCV: 89.4 fL (ref 80.0–100.0)
Monocytes Absolute: 0.9 10*3/uL (ref 0.1–1.0)
Monocytes Relative: 12 %
NEUTROS ABS: 4.5 10*3/uL (ref 1.7–7.7)
NEUTROS PCT: 63 %
Platelets: 251 10*3/uL (ref 150–400)
RBC: 3.85 MIL/uL — ABNORMAL LOW (ref 3.87–5.11)
RDW: 16.3 % — ABNORMAL HIGH (ref 11.5–15.5)
WBC: 7.2 10*3/uL (ref 4.0–10.5)
nRBC: 0 % (ref 0.0–0.2)

## 2018-11-28 LAB — GLUCOSE, CAPILLARY
Glucose-Capillary: 127 mg/dL — ABNORMAL HIGH (ref 70–99)
Glucose-Capillary: 266 mg/dL — ABNORMAL HIGH (ref 70–99)

## 2018-11-28 MED ORDER — SIMVASTATIN 20 MG PO TABS: 20.0000 mg | ORAL_TABLET | Freq: Every evening | ORAL | 1 refills | Status: DC

## 2018-11-28 MED ORDER — AMIODARONE HCL 200 MG PO TABS: 200.0000 mg | ORAL_TABLET | Freq: Three times a day (TID) | ORAL | 1 refills | Status: DC

## 2018-11-28 NOTE — TOC Progression Note (Signed)
Transition of Care [facility]) - Progression Note    Patient Details  Name: Diane Mullins MRN: [id number] Date of Birth: 11/17/1932  Transition of Care [facility] CM/SW Contact  Elza Rafter, RN Phone Number: 11/28/2018, 10:31 AM  Clinical Narrative:   Faxed signed heart failure protocol to [facility].      Expected Discharge Plan: [place] Barriers to Discharge: No Barriers Identified  Expected Discharge Plan and Services Expected Discharge Plan: [place] Discharge Planning Services: CM Consult, Medication Assistance Post Acute Care Choice: Durable Medical Equipment, Home Health Living arrangements for the past 2 months: Apartment Expected Discharge Date: 11/27/18                         Social Determinants of Health (SDOH) Interventions    Readmission Risk Interventions 30 Day Unplanned Readmission Risk Score     ED to Hosp-Admission (Current) from 11/25/2018 in [place] (2A)  30 Day Unplanned Readmission Risk Score (%)  16 Filed at 11/28/2018 0800     This score is the patient's risk of an unplanned readmission within 30 days of being discharged (0 -100%). The score is based on dignosis, age, lab data, medications, orders, and past utilization.   Low:  0-14.9   Medium: 15-21.9   High: 22-29.9   Extreme: 30 and above       Readmission Risk Prevention Plan 11/26/2018  Transportation Screening Complete  PCP or Specialist Appt within 5-7 Days Complete  Home Care Screening Complete  Medication Review (RN CM) Complete  Some recent data might be hidden

## 2018-11-28 NOTE — Progress Notes (Signed)
Cardiovascular and Pulmonary Nurse Navigator Note:    85 year old female with PMHx significant for atrial flutter/fibrillation s/p cardioversion last month -   (Patient is on Eliquis), chronic diastolic HF, DM, HTN, Hx of TIA, anemia of chronic disease and COPD - not on home oxygen - who presented to hospital secondary to palpitations and noted to have atrial flutter and acute on chronic diastolic CHF which is felt to have been due to elevated rate from atrial flutter.    CHF Education:   CHF is not a new diagnosis for this patient. Educational session with patient / brother completed.  Patient gave verbal permission for this RN to speak in front of her brother.  "Living Better with Heart Failure" packet at bedside.   Patient stated she has not read this packet.  Reviewed HF packet with patient and brother.  Briefly reviewed definition of heart failure and signs and symptoms of an exacerbation.?Explained to patient that HF is a chronic illness which requires self-assessment / self-management along with help from the cardiologist/PCP.?? ? *Reviewed importance of and reason behind checking weight daily in the AM, after using the bathroom, but before getting dressed. Patient has functioning scale.  ? *Reviewed with patient the following information: *Discussed when to call the Dr= weight gain of >2-3lb overnight of 5lb in a week,  *Discussed yellow zone= call MD: weight gain of >2-3lb overnight of 5lb in a week, increased swelling, increased SOB when lying down, chest discomfort, dizziness, increased fatigue *Red Zone= call 911: struggle to breath, fainting or near fainting, significant chest pain   *Diet - Reviewed low sodium diet-provided handout of recommended and not recommended foods.? ? *Discussed fluid intake with patient as well. Patient aware of fluid restriction.  Patient stated her cardiologist wants her to restrict to 1000 ml per day.  Demonstrated this amount using the bedside water  pitcher.  Patient verbalized understanding this amount is for all liquids.   ? *Instructed patient to take medications as prescribed for heart failure. Explained briefly why pt is on the medications (either make you feel better, live longer or keep you out of the hospital) and discussed monitoring and side effects.  ? *Activity & On going care - CM RN setting up [facility] with RN (CHF Protocol with ReDS vest / PT.  Encouraged patient to remain as active as possible.    *Smoking Cessation- Patient is a NEVER smoker.? ? *[facility] - Explained the purpose of the HF Clinic. Patient has actually been seen in the [place] in [place] with the last visit in 06/2018.  Patient cancelled both appointments in January 2020.  Patient stated, "I will go to the [facility] if I schedule the appt.  I do not want y'all making my appts.  You know, these co-pays are killing me. Just provide me the phone number and I will make the appt."    PCP Provider - Dr. Caryl Bis on 11/28/2018 at 11:30 a.m.   Cardiologist - Dr. Rockey Situ on 12/12/2018 at 10:30 a.m.   ? Again, the 5 Steps to Living Better with Heart Failure were reviewed with patient. ? Patient thanked me for providing the above information. ? ? Roanna Epley, RN, BSN, [facility]? [place] Cardiac &?Pulmonary Rehab  Cardiovascular &?Pulmonary Nurse Navigator  Direct Line: [phone]  Department Phone #: [phone] Fax: [phone]? Email Address: [email or web address]

## 2018-11-28 NOTE — Progress Notes (Signed)
Progress Note  Patient Name: Diane Mullins Date of Encounter: 11/28/2018  Primary Cardiologist: Ida Rogue, MD  Subjective   Diuresed well yesterday.  No recurrent dyspnea.  Has not ambulated yet.  Eager to go home.  Inpatient Medications    Scheduled Meds: . amiodarone  200 mg Oral TID  . apixaban  5 mg Oral BID  . furosemide  40 mg Intravenous BID  . insulin aspart  0-5 Units Subcutaneous QHS  . insulin aspart  0-9 Units Subcutaneous TID WC  . insulin glargine  10 Units Subcutaneous Daily  . levothyroxine  75 mcg Oral Daily  . metoprolol succinate  25 mg Oral Daily  . pantoprazole  40 mg Oral Daily  . potassium chloride  10 mEq Oral Daily  . simvastatin  20 mg Oral QPM  . sodium chloride flush  10 mL Intravenous Q12H   Continuous Infusions:  PRN Meds: acetaminophen **OR** acetaminophen, ondansetron **OR** ondansetron (ZOFRAN) IV   Vital Signs    Vitals:   11/27/18 1530 11/27/18 1927 11/28/18 0411 11/28/18 0500  BP: (!) 117/45 133/67 (!) 135/53   Pulse: 66 73 79   Resp: 20     Temp: 97.6 F (36.4 C) 97.6 F (36.4 C) 97.7 F (36.5 C)   TempSrc: Oral Oral Oral   SpO2: 95% 93% 91%   Weight:    92.8 kg  Height:        Intake/Output Summary (Last 24 hours) at 11/28/2018 1104 Last data filed at 11/28/2018 0557 Gross per 24 hour  Intake -  Output 1400 ml  Net -1400 ml   Filed Weights   11/26/18 0031 11/27/18 0425 11/28/18 0500  Weight: 95.1 kg 93.6 kg 92.8 kg    Physical Exam   GEN: Well nourished, well developed, in no acute distress.  HEENT: Grossly normal.  Neck: Supple, JVP approximately 12 cm, no carotid bruits, or masses. Cardiac: RRR, no murmurs, rubs, or gallops. No clubbing, cyanosis, trace bilateral ankle edema.  Radials/DP/PT 2+ and equal bilaterally.  Respiratory:  Respirations regular and unlabored, bibasilar crackles, otherwise clear to auscultation bilaterally. GI: Soft, nontender, nondistended, BS + x 4. MS: no deformity or atrophy.  Skin: warm and dry, no rash. Neuro:  Strength and sensation are intact. Psych: AAOx3.  Normal affect.  Labs    Chemistry Recent Labs  Lab 11/26/18 0412 11/27/18 0415 11/28/18 0442  NA 141 139 143  K 4.1 4.4 3.8  CL 109 104 106  CO2 25 28 30   GLUCOSE 159* 140* 147*  BUN 14 12 12   CREATININE 0.90 0.88 0.99  CALCIUM 8.3* 8.6* 8.6*  GFRNONAA 59* >60 52*  GFRAA >60 >60 >60  ANIONGAP 7 7 7      Hematology Recent Labs  Lab 11/26/18 0922 11/27/18 0415 11/28/18 0442  WBC 8.2 10.1 7.2  RBC 4.05 3.88 3.85*  HGB 11.1* 10.7* 10.6*  HCT 35.8* 34.4* 34.4*  MCV 88.4 88.7 89.4  MCH 27.4 27.6 27.5  MCHC 31.0 31.1 30.8  RDW 16.5* 16.3* 16.3*  PLT 263 257 251    Cardiac Enzymes Recent Labs  Lab 11/25/18 1904 11/26/18 0922 11/26/18 1513  TROPONINI 0.06* 0.05* 0.04*      BNP Recent Labs  Lab 11/25/18 1904  BNP 759.0*       Radiology    Dg Chest 2 View  Result Date: 11/27/2018 CLINICAL DATA:  CHF EXAM: CHEST - 2 VIEW COMPARISON:  11/25/2018 FINDINGS: Cardiomegaly and generalized interstitial coarsening that is stable. Trace  pleural effusions. No pneumothorax. IMPRESSION: Cardiomegaly and vascular congestion with trace pleural fluid. Electronically Signed   By: Monte Fantasia M.D.   On: 11/27/2018 10:23    Telemetry    RSR, rare sub 2 sec pauses. - Personally Reviewed  Cardiac Studies   Transesophageal Echocardiogram 2.4.2020  Study Conclusions  - Limited study to exclude intraatrial thrombus. - Mitral valve: There was mild to moderate regurgitation directed eccentrically. - Left atrium: No evidence of thrombus in the atrial cavity or appendage. No evidence of thrombus in the atrial cavity or appendage. - Right atrium: No evidence of thrombus in the atrial cavity or appendage. - Atrial septum: Doppler showed no atrial level shunt.  Impressions:  - Successful cardioversion. No cardiac source of emboli was indentified. _____________    Direct Current Cardioversion 3.10.2020  Cardioverted 1 time(s).  Cardioverted at150J. Synchronized biphasic Converted to NSR  Patient Profile     85 y.o. female w/ a h/o atrial flutter s/p TEE/DCCV 10/22/2018, HFpEF, DMII, HTN, HL, anemia, COPD, vertigo, and prior CVA/TIA, who was admitted 3/9 secondary to recurrent rapid atrial flutter and CHF.  Assessment & Plan    1.  Atrial flutter with rapid ventricular response: Status post TEE cardioversion in February with recurrent tachycardia and atrial flutter on March 9.  She is now status post repeat cardioversion on March 10 and has been maintaining sinus rhythm on amiodarone therapy.  She also remains on Eliquis (chads vascular equals 8), and low-dose beta-blocker.  We will plan to continue amiodarone 200 mg 3 times daily x2 weeks and then will plan to reduce down to twice daily as an outpatient.  2.  Acute on chronic diastolic congestive heart failure: EF 60 to 65% by echo in October 2019.  She responded well to IV Lasix yesterday and was -1.4 L, and 2 L for this entire admission.  Weight down to 92.8 kg which is similar to where she was at at the time of her last discharge.  She does have crackles on exam with trace edema at her ankles.  I have allowed for her to get 1 more dose of IV Lasix this morning and then suspect she can be transitioned back to her prior home dose of 20 mg p.o. daily.  Heart rate and blood pressure stable.  3.  Essential hypertension: Stable.  4.  Type 2 diabetes mellitus: A1c 9.6 in January.  Insulin management per internal medicine.  5.  Hypothyroidism: On replacement.  TSH 1.862 in January.  6.  Normocytic anemia: H&H have been stable throughout this admission.  Aspirin discontinued in the setting of Eliquis therapy.  7.  Hyperlipidemia: LDL 74 in January.  Continue statin therapy.  8.  Disposition: Okay for discharge after ambulation.  Follow-up in cardiology clinic in approximately 2 weeks.  Signed,  Murray Hodgkins, NP  11/28/2018, 11:04 AM    For questions or updates, please contact   Please consult [email or web address] for contact info under Cardiology/STEMI.

## 2018-11-28 NOTE — Plan of Care (Signed)
Pt ready for discharge.  Problem: Education: Goal: Knowledge of General Education information will improve Description Including pain rating scale, medication(s)/side effects and non-pharmacologic comfort measures Outcome: Completed/Met   Problem: Health Behavior/Discharge Planning: Goal: Ability to manage health-related needs will improve Outcome: Completed/Met   Problem: Clinical Measurements: Goal: Ability to maintain clinical measurements within normal limits will improve Outcome: Completed/Met Goal: Will remain free from infection Outcome: Completed/Met Goal: Diagnostic test results will improve Outcome: Completed/Met Goal: Respiratory complications will improve Outcome: Completed/Met Goal: Cardiovascular complication will be avoided Outcome: Completed/Met   Problem: Safety: Goal: Ability to remain free from injury will improve Outcome: Completed/Met   Problem: Education: Goal: Knowledge of disease or condition will improve Outcome: Completed/Met Goal: Understanding of medication regimen will improve Outcome: Completed/Met Goal: Individualized Educational Video(s) Outcome: Completed/Met   Problem: Activity: Goal: Ability to tolerate increased activity will improve Outcome: Completed/Met   Problem: Cardiac: Goal: Ability to achieve and maintain adequate cardiopulmonary perfusion will improve Outcome: Completed/Met

## 2018-11-29 ENCOUNTER — Other Ambulatory Visit: Payer: Self-pay

## 2018-11-29 ENCOUNTER — Telehealth: Payer: Self-pay | Admitting: Family Medicine

## 2018-11-29 ENCOUNTER — Telehealth: Payer: Self-pay

## 2018-11-29 MED ORDER — APIXABAN 5 MG PO TABS: 5.0000 mg | ORAL_TABLET | Freq: Two times a day (BID) | ORAL | 1 refills | Status: DC

## 2018-11-29 NOTE — Telephone Encounter (Signed)
Copied from [place] [id number]. Topic: General - Other >> Nov 29, 2018  8:46 AM Carolyn Stare wrote:      Pt is calling about her RX for apixaban (ELIQUIS) 5 MG TABS tablet, I see where the RX was printed but not sure if it was sent to the office, pt ask if there is a coupon for this med also   [place]

## 2018-11-29 NOTE — Telephone Encounter (Signed)
Called and spoke with pt. Rx was "printed" resent Rx electronically for pt. Pt aware this was done.

## 2018-11-29 NOTE — Telephone Encounter (Signed)
Called [facility] and they confirmed that they did get the RX

## 2018-11-29 NOTE — Telephone Encounter (Signed)
Transition Care Management Follow-up Telephone Call  How have you been since you were released from the hospital? Patient staed she is feeling better and getting stronger.   Do you understand why you were in the hospital? yes   Do you understand the discharge instrcutions? yes  Items Reviewed:  Medications reviewed: yes  Allergies reviewed: yes  Dietary changes reviewed: yes  Referrals reviewed: yes   Functional Questionnaire:   Activities of Daily Living (ADLs):   She states they are independent in the following: ambulation, feeding, continence, grooming and toileting States they require assistance with the following: bathing and hygiene and dressing   Any transportation issues/concerns?: no   Any patient concerns? no   Confirmed importance and date/time of follow-up visits scheduled: yes   Confirmed with patient if condition begins to worsen call PCP or go to the ER.  Patient was given the Call-a-Nurse line [phone]: yes

## 2018-11-29 NOTE — Discharge Summary (Signed)
[facility] [place] NAME: Diane Mullins    MR#:  [id number]  DATE OF BIRTH:  02/07/1933  DATE OF ADMISSION:  11/25/2018   ADMITTING PHYSICIAN: Lance Coon, MD  DATE OF DISCHARGE: 11/28/2018  4:38 PM  PRIMARY CARE PHYSICIAN: [name], MD   ADMISSION DIAGNOSIS:   Atrial flutter, unspecified type ([place]) [I48.92]  DISCHARGE DIAGNOSIS:   Principal Problem:   Atrial flutter with rapid ventricular response ([place]) Active Problems:   Hypertension   Hyperlipidemia   Hypothyroidism   Diabetes ([place])   (HFpEF) heart failure with preserved ejection fraction ([place])   SECONDARY DIAGNOSIS:   Past Medical History:  Diagnosis Date  . Atrial fibrillation ([place])   . Benign paroxysmal positional vertigo 07/17/2014  . Chronic diastolic CHF (congestive heart failure) ([place])    a. echo 10/19: EF of 60-65%, no RWMA, Gr2DD, calcified mitral annulus with mild MR, mildly to moderately dilated left atrium, moderate TR, PASP 52 mmHg  . Diabetes mellitus   . H/O: rheumatic fever   . Hyperlipidemia   . Hypertension   . Pulmonary hypertension ([place])   . Stroke [facility])    TIA Jan. 1st  . Teratoma of left ovary   . Thyroid disease     HOSPITAL COURSE:   85 year old female with past medical history significant for atrial flutter/fibrillation status post cardioversion last month on Eliquis, chronic diastolic heart failure, diabetes, hypertension, history of TIA, anemia of chronic disease and COPD not on home oxygen presents to hospital secondary to palpitations and noted to have atrial flutter again.  1.  Recurrent atrial flutter with RVR-was here last month for the same complaints and had cardioversion successfully.  But converted to atrial flutter again with symptoms. -On Eliquis for anticoagulation. -s/p cardioversion again this admission- in sinus rhythm now - off amiodarone drip and started PO amiodarone.  Continue at discharge 3 times a day 200 mg  for 2 weeks followed by twice daily.  Simvastatin dose reduced due to drug interaction with amiodarone. -Appreciate cardiology consult. - continue low-dose Toprol by mouth  2.  Acute on chronic diastolic CHF-likely triggered secondary to elevated rate from atrial flutter -Was started on IV Lasix in the hospital with improvement.  Discharged on home dose of Lasix with potassium supplements -Appreciate cardiology input. - off oxygen prior to discharge -Most recent echocardiogram from 4 months ago showing normal LV function, EF of 60 to 65% with no regional wall motion abnormalities but has diastolic dysfunction.  Elevated pulmonary artery pressures present  3.  Hypokalemia-replaced  4.  Hypothyroidism-Synthroid  5.  diabetes mellitus- low-dose Lantus and metformin at home   PT recommended home health Patient is from [facility] independent living  Will be discharged home with home health services  DISCHARGE CONDITIONS:   Guarded  CONSULTS OBTAINED:   Treatment Team:  Minna Merritts, MD End, Harrell Gave, MD  DRUG ALLERGIES:   Allergies  Allergen Reactions  . Oxycodone     Other reaction(s): Confusion Patient's daughter reported confusion/sensitivity to oxycodone.    DISCHARGE MEDICATIONS:   Allergies as of 11/28/2018      Reactions   Oxycodone    Other reaction(s): Confusion Patient's daughter reported confusion/sensitivity to oxycodone.      Medication List    STOP taking these medications   apixaban 5 MG Tabs tablet Commonly known as:  ELIQUIS   ibuprofen 200 MG tablet Commonly known as:  ADVIL,MOTRIN   losartan 50 MG tablet  Commonly known as:  COZAAR     TAKE these medications   acetaminophen 325 MG tablet Commonly known as:  TYLENOL Take 650 mg by mouth every 6 (six) hours as needed.   amiodarone 200 MG tablet Commonly known as:  PACERONE Take 1 tablet (200 mg total) by mouth 3 (three) times daily. Please give 200mg  PO TID for 2 weeks  followed by 200mg  PO BID   aspirin EC 81 MG tablet Take 81 mg by mouth daily.   ferrous sulfate 325 (65 FE) MG EC tablet Take 1 tablet (325 mg total) by mouth daily with breakfast.   furosemide 20 MG tablet Commonly known as:  LASIX Take 1 tablet (20 mg total) by mouth daily.   glucose blood test strip Commonly known as:  ONE TOUCH ULTRA TEST Use as instructed to check Blood sugar three times a day.  E11.65 Dispense patient choice.   Insulin Glargine 100 UNIT/ML Solostar Pen Commonly known as:  Lantus SoloStar INJECT  17 UNITS SUBCUTANEOUSLY IN THE MORNING   levothyroxine 75 MCG tablet Commonly known as:  SYNTHROID, LEVOTHROID Take 1 tablet (75 mcg total) by mouth daily.   metFORMIN 1000 MG tablet Commonly known as:  GLUCOPHAGE Take 1 tablet (1,000 mg total) by mouth 2 (two) times daily with a meal. TAKE 1 TABLET TWICE DAILY  WITH  A  MEAL   metoprolol succinate 25 MG 24 hr tablet Commonly known as:  TOPROL-XL Take 1 tablet (25 mg total) by mouth daily.   omeprazole 20 MG capsule Commonly known as:  PRILOSEC Take 1 capsule (20 mg total) by mouth daily.   Pen Needles 32G X 4 MM Misc Inject 1 Dose into the skin daily.   polyethylene glycol packet Commonly known as:  MIRALAX / GLYCOLAX Take 17 g by mouth daily as needed.   potassium chloride 10 MEQ tablet Commonly known as:  K-DUR TAKE 1 TABLET BY MOUTH EVERY DAY   simvastatin 20 MG tablet Commonly known as:  ZOCOR Take 1 tablet (20 mg total) by mouth every evening. What changed:    medication strength  how much to take        DISCHARGE INSTRUCTIONS:   1.  PCP follow-up in 1 to 2 weeks 2.  Cardiology follow-up in 2 weeks  DIET:   Cardiac diet  ACTIVITY:   Activity as tolerated  OXYGEN:   Home Oxygen: No.  Oxygen Delivery: room air  DISCHARGE LOCATION:   home   If you experience worsening of your admission symptoms, develop shortness of breath, life threatening emergency, suicidal or  homicidal thoughts you must seek medical attention immediately by calling 911 or calling your MD immediately  if symptoms less severe.  You Must read complete instructions/literature along with all the possible adverse reactions/side effects for all the Medicines you take and that have been prescribed to you. Take any new Medicines after you have completely understood and accpet all the possible adverse reactions/side effects.   Please note  You were cared for by a hospitalist during your hospital stay. If you have any questions about your discharge medications or the care you received while you were in the hospital after you are discharged, you can call the unit and asked to speak with the hospitalist on call if the hospitalist that took care of you is not available. Once you are discharged, your primary care physician will handle any further medical issues. Please note that NO REFILLS for any discharge medications will be authorized  once you are discharged, as it is imperative that you return to your primary care physician (or establish a relationship with a primary care physician if you do not have one) for your aftercare needs so that they can reassess your need for medications and monitor your lab values.    On the day of Discharge:  VITAL SIGNS:   Blood pressure (!) 131/57, pulse 72, temperature 98 F (36.7 C), temperature source Oral, resp. rate 20, height 5\' 1"  (1.549 m), weight 92.8 kg, SpO2 92 %.  PHYSICAL EXAMINATION:    GENERAL:  85 y.o.-year-old elderly patient lying in the bed with no acute distress.  EYES: Pupils equal, round, reactive to light and accommodation. No scleral icterus. Extraocular muscles intact.  HEENT: Head atraumatic, normocephalic. Oropharynx and nasopharynx clear.  NECK:  Supple, no jugular venous distention. No thyroid enlargement, no tenderness.  LUNGS: Normal breath sounds bilaterally, no wheezing rhonchi or crepitation. No use of accessory muscles of  respiration. Bibasilar crackles. CARDIOVASCULAR: S1, S2 rapid and irregular. No  rubs, or gallops. 3/6 systolic murmur present ABDOMEN: Soft, nontender, nondistended. Bowel sounds present. No organomegaly or mass.  EXTREMITIES: No  cyanosis, or clubbing. 1+ pedal edema NEUROLOGIC: Cranial nerves II through XII are intact. Muscle strength 5/5 in all extremities. Sensation intact. Gait not checked. Global weakness noted. PSYCHIATRIC: The patient is alert and oriented x 3.  SKIN: No obvious rash, lesion, or ulcer.  DATA REVIEW:   CBC Recent Labs  Lab 11/28/18 0442  WBC 7.2  HGB 10.6*  HCT 34.4*  PLT 251    Chemistries  Recent Labs  Lab 11/28/18 0442  NA 143  K 3.8  CL 106  CO2 30  GLUCOSE 147*  BUN 12  CREATININE 0.99  CALCIUM 8.6*     Microbiology Results  Results for orders placed or performed during the hospital encounter of 11/25/18  MRSA PCR Screening     Status: None   Collection Time: 11/26/18  1:16 AM  Result Value Ref Range Status   MRSA by PCR NEGATIVE NEGATIVE Final    Comment:        The GeneXpert MRSA Assay (FDA approved for NASAL specimens only), is one component of a comprehensive MRSA colonization surveillance program. It is not intended to diagnose MRSA infection nor to guide or monitor treatment for MRSA infections. Performed at [facility], [address], [place], [place] [address]     RADIOLOGY:  No results found.   Management plans discussed with the patient, family and they are in agreement.  CODE STATUS:  Code Status History    Date Active Date Inactive Code Status Order ID Comments User Context   11/26/2018 0030 11/28/2018 1939 Full Code [id number]  Lance Coon, MD Inpatient   10/17/2018 1654 10/28/2018 2002 Full Code [id number]  Gladstone Lighter, MD ED   07/05/2018 2206 07/09/2018 2156 Full Code [id number]  Salary, Avel Peace, MD Inpatient   10/28/2015 1501 10/29/2015 1718 Full Code [id number]  Henreitta Leber, MD Inpatient    09/18/2015 1639 09/20/2015 2243 Full Code [id number]  Gladstone Lighter, MD Inpatient    Advance Directive Documentation     Most Recent Value  Type of Advance Directive  Healthcare Power of Attorney  Pre-existing out of facility DNR order (yellow form or pink MOST form)  -  "MOST" Form in Place?  -      TOTAL TIME TAKING CARE OF THIS PATIENT: 38 minutes.    Gladstone Lighter M.D on 11/29/2018 at 1:57  PM  Between 7am to 6pm - Pager - [phone]  After 6pm go to [email or web address] - Technical brewer [facility]  Office  [phone]  CC: Primary care physician; [name], MD   Note: This dictation was prepared with Dragon dictation along with smaller phrase technology. Any transcriptional errors that result from this process are unintentional.

## 2018-12-01 NOTE — Progress Notes (Deleted)
Cardiology Office Note Date:  12/01/2018  Patient ID:  Diane Mullins, Diane Mullins 05-Jan-1933, MRN [id number] PCP:  [name], MD  Cardiologist:  Dr. Rockey Situ, MD  ***refresh   Chief Complaint: Hospital follow up  History of Present Illness: Diane Mullins is a 85 y.o. female with history of ***   Past Medical History:  Diagnosis Date  . Atrial fibrillation ([place])   . Benign paroxysmal positional vertigo 07/17/2014  . Chronic diastolic CHF (congestive heart failure) ([place])    a. echo 10/19: EF of 60-65%, no RWMA, Gr2DD, calcified mitral annulus with mild MR, mildly to moderately dilated left atrium, moderate TR, PASP 52 mmHg  . Diabetes mellitus   . H/O: rheumatic fever   . Hyperlipidemia   . Hypertension   . Pulmonary hypertension ([place])   . Stroke [facility])    TIA Jan. 1st  . Teratoma of left ovary   . Thyroid disease     Past Surgical History:  Procedure Laterality Date  . ABDOMINAL HYSTERECTOMY  1973   menorrhagia  . CARDIOVERSION N/A 10/22/2018   Procedure: CARDIOVERSION;  Surgeon: Nelva Bush, MD;  Location: [facility] ORS;  Service: Cardiovascular;  Laterality: N/A;  . CARDIOVERSION N/A 11/26/2018   Procedure: CARDIOVERSION (CATH LAB);  Surgeon: Minna Merritts, MD;  Location: [facility] ORS;  Service: Cardiovascular;  Laterality: N/A;  . [place]  . ORIF ANKLE FRACTURE Right 10/29/2015   Procedure: OPEN REDUCTION INTERNAL FIXATION (ORIF) ANKLE FRACTURE;  Surgeon: Thornton Park, MD;  Location: [facility] ORS;  Service: Orthopedics;  Laterality: Right;  . TEE WITHOUT CARDIOVERSION N/A 10/22/2018   Procedure: TRANSESOPHAGEAL ECHOCARDIOGRAM (TEE);  Surgeon: Nelva Bush, MD;  Location: [facility] ORS;  Service: Cardiovascular;  Laterality: N/A;  . VAGINAL DELIVERY     x4    No outpatient medications have been marked as taking for the 12/12/18 encounter (Appointment) with Rise Mu, PA-C.    Allergies:   Oxycodone   Social History:  The patient  reports that she has never  smoked. She has never used smokeless tobacco. She reports that she does not drink alcohol or use drugs.   Family History:  The patient's family history includes Cancer in her brother, daughter, and father; Diabetes in her brother and sister; Heart attack (age of onset: 31) in her mother; Heart disease (age of onset: 64) in her mother; Hypertension in her sister; Non-Hodgkin's lymphoma in her son.  ROS:   ROS   PHYSICAL EXAM: *** VS:  There were no vitals taken for this visit. BMI: There is no height or weight on file to calculate BMI.  Physical Exam   EKG:  Was ordered and interpreted by me today. Shows ***  Recent Labs: 08/27/2018: ALT 9 10/17/2018: TSH 1.862 10/28/2018: Magnesium 1.9 11/25/2018: B Natriuretic Peptide 759.0 11/28/2018: BUN 12; Creatinine, Ser 0.99; Hemoglobin 10.6; Platelets 251; Potassium 3.8; Sodium 143  04/09/2018: Direct LDL 80.0 07/06/2018: Cholesterol 130; HDL 35; LDL Cholesterol 74; Total CHOL/HDL Ratio 3.7; Triglycerides 107; VLDL 21   Estimated Creatinine Clearance: 43.9 mL/min (by C-G formula based on SCr of 0.99 mg/dL).   Wt Readings from Last 3 Encounters:  11/28/18 204 lb 8 oz (92.8 kg)  10/28/18 204 lb 3.2 oz (92.6 kg)  10/02/18 206 lb 14.4 oz (93.8 kg)     Other studies reviewed: Additional studies/records reviewed today include: summarized above  ASSESSMENT AND PLAN:  1. ***  Disposition: F/u with Dr. Rockey Situ or an APP in ***.  Current medicines  are reviewed at length with the patient today.  The patient did not have any concerns regarding medicines.  Signed, Christell Faith, PA-C 12/01/2018 2:06 PM     [address] [place], [place] [address] [phone]

## 2018-12-04 ENCOUNTER — Other Ambulatory Visit: Payer: Self-pay

## 2018-12-04 DIAGNOSIS — E039 Hypothyroidism, unspecified: Secondary | ICD-10-CM | POA: Diagnosis not present

## 2018-12-04 DIAGNOSIS — Z9181 History of falling: Secondary | ICD-10-CM | POA: Diagnosis not present

## 2018-12-04 DIAGNOSIS — K5790 Diverticulosis of intestine, part unspecified, without perforation or abscess without bleeding: Secondary | ICD-10-CM | POA: Diagnosis not present

## 2018-12-04 DIAGNOSIS — Z7982 Long term (current) use of aspirin: Secondary | ICD-10-CM | POA: Diagnosis not present

## 2018-12-04 DIAGNOSIS — D271 Benign neoplasm of left ovary: Secondary | ICD-10-CM | POA: Diagnosis not present

## 2018-12-04 DIAGNOSIS — I110 Hypertensive heart disease with heart failure: Secondary | ICD-10-CM | POA: Diagnosis not present

## 2018-12-04 DIAGNOSIS — E119 Type 2 diabetes mellitus without complications: Secondary | ICD-10-CM | POA: Diagnosis not present

## 2018-12-04 DIAGNOSIS — I5033 Acute on chronic diastolic (congestive) heart failure: Secondary | ICD-10-CM | POA: Diagnosis not present

## 2018-12-04 DIAGNOSIS — I700 Atherosclerosis of aorta: Secondary | ICD-10-CM | POA: Diagnosis not present

## 2018-12-04 DIAGNOSIS — Z794 Long term (current) use of insulin: Secondary | ICD-10-CM | POA: Diagnosis not present

## 2018-12-04 DIAGNOSIS — I4891 Unspecified atrial fibrillation: Secondary | ICD-10-CM | POA: Diagnosis not present

## 2018-12-04 DIAGNOSIS — E785 Hyperlipidemia, unspecified: Secondary | ICD-10-CM | POA: Diagnosis not present

## 2018-12-04 DIAGNOSIS — Z7901 Long term (current) use of anticoagulants: Secondary | ICD-10-CM | POA: Diagnosis not present

## 2018-12-04 DIAGNOSIS — Z8673 Personal history of transient ischemic attack (TIA), and cerebral infarction without residual deficits: Secondary | ICD-10-CM | POA: Diagnosis not present

## 2018-12-04 DIAGNOSIS — I4892 Unspecified atrial flutter: Secondary | ICD-10-CM | POA: Diagnosis not present

## 2018-12-04 DIAGNOSIS — I272 Pulmonary hypertension, unspecified: Secondary | ICD-10-CM | POA: Diagnosis not present

## 2018-12-04 NOTE — Patient Outreach (Signed)
[facility]) Care Management  12/04/2018  Diane Mullins [birth year] [id number]    EMMI-General Discharge RED ON EMMI ALERT Day # 4 Date: 12/03/2018 Red Alert Reason: "Sad/hopeless/anxious/empty? Yes"   Outreach attempt #1 to patient. Spoke with patient who denies any acute issues or concerns at this time. She states she is doing okay.Reviewed and addressed red alert with patient. She reports that she is just concerned about all that is going on in the country with the virus outbreak. She voices that she lives in independent senior living center and they have been advised not to go anywhere for at least the next 14 days. Patient reports she has PCP appt on 12/10/2018 but will reschedule it due to not wanting to be out in the public during this time. She reports that she was supposed to be set up with [facility] services. Per chart review, HHPT recommended. RN CM provided patient with instructions on how to follow up. She denies any further RN CM needs or concerns at this time. Patient has completed post discharge automated calls.     Plan: RN CM will close case at this time.   Enzo Montgomery, RN,BSN,CCM [place] Telephonic Care Management Coordinator Direct Phone: [phone] Toll Free: [phone] Fax: [phone]

## 2018-12-05 ENCOUNTER — Ambulatory Visit: Payer: PPO | Admitting: Cardiovascular Disease

## 2018-12-09 DIAGNOSIS — E785 Hyperlipidemia, unspecified: Secondary | ICD-10-CM | POA: Diagnosis not present

## 2018-12-09 DIAGNOSIS — D271 Benign neoplasm of left ovary: Secondary | ICD-10-CM | POA: Diagnosis not present

## 2018-12-09 DIAGNOSIS — I5033 Acute on chronic diastolic (congestive) heart failure: Secondary | ICD-10-CM | POA: Diagnosis not present

## 2018-12-09 DIAGNOSIS — I4892 Unspecified atrial flutter: Secondary | ICD-10-CM | POA: Diagnosis not present

## 2018-12-09 DIAGNOSIS — E039 Hypothyroidism, unspecified: Secondary | ICD-10-CM | POA: Diagnosis not present

## 2018-12-09 DIAGNOSIS — I110 Hypertensive heart disease with heart failure: Secondary | ICD-10-CM | POA: Diagnosis not present

## 2018-12-09 DIAGNOSIS — Z794 Long term (current) use of insulin: Secondary | ICD-10-CM | POA: Diagnosis not present

## 2018-12-09 DIAGNOSIS — Z8673 Personal history of transient ischemic attack (TIA), and cerebral infarction without residual deficits: Secondary | ICD-10-CM | POA: Diagnosis not present

## 2018-12-09 DIAGNOSIS — I700 Atherosclerosis of aorta: Secondary | ICD-10-CM | POA: Diagnosis not present

## 2018-12-09 DIAGNOSIS — Z7901 Long term (current) use of anticoagulants: Secondary | ICD-10-CM | POA: Diagnosis not present

## 2018-12-09 DIAGNOSIS — Z7982 Long term (current) use of aspirin: Secondary | ICD-10-CM | POA: Diagnosis not present

## 2018-12-09 DIAGNOSIS — I272 Pulmonary hypertension, unspecified: Secondary | ICD-10-CM | POA: Diagnosis not present

## 2018-12-09 DIAGNOSIS — E119 Type 2 diabetes mellitus without complications: Secondary | ICD-10-CM | POA: Diagnosis not present

## 2018-12-09 DIAGNOSIS — K5790 Diverticulosis of intestine, part unspecified, without perforation or abscess without bleeding: Secondary | ICD-10-CM | POA: Diagnosis not present

## 2018-12-09 DIAGNOSIS — Z9181 History of falling: Secondary | ICD-10-CM | POA: Diagnosis not present

## 2018-12-09 DIAGNOSIS — I4891 Unspecified atrial fibrillation: Secondary | ICD-10-CM | POA: Diagnosis not present

## 2018-12-10 ENCOUNTER — Ambulatory Visit (INDEPENDENT_AMBULATORY_CARE_PROVIDER_SITE_OTHER): Payer: PPO | Admitting: Family Medicine

## 2018-12-10 ENCOUNTER — Other Ambulatory Visit: Payer: Self-pay

## 2018-12-10 ENCOUNTER — Encounter: Payer: Self-pay | Admitting: Family Medicine

## 2018-12-10 ENCOUNTER — Telehealth: Payer: Self-pay

## 2018-12-10 VITALS — BP 140/66 | HR 70 | Temp 97.8°F | Ht 61.0 in

## 2018-12-10 DIAGNOSIS — I4892 Unspecified atrial flutter: Secondary | ICD-10-CM | POA: Diagnosis not present

## 2018-12-10 DIAGNOSIS — E119 Type 2 diabetes mellitus without complications: Secondary | ICD-10-CM

## 2018-12-10 DIAGNOSIS — Z794 Long term (current) use of insulin: Secondary | ICD-10-CM | POA: Diagnosis not present

## 2018-12-10 DIAGNOSIS — E1122 Type 2 diabetes mellitus with diabetic chronic kidney disease: Secondary | ICD-10-CM

## 2018-12-10 DIAGNOSIS — K5900 Constipation, unspecified: Secondary | ICD-10-CM

## 2018-12-10 DIAGNOSIS — I5032 Chronic diastolic (congestive) heart failure: Secondary | ICD-10-CM

## 2018-12-10 DIAGNOSIS — D649 Anemia, unspecified: Secondary | ICD-10-CM

## 2018-12-10 DIAGNOSIS — N183 Chronic kidney disease, stage 3 (moderate): Secondary | ICD-10-CM

## 2018-12-10 NOTE — Telephone Encounter (Signed)
COVID-19 Pre-Screening Questions:  . Have you been in contact with someone that was recently sick with fever/cough or confirmed to have the [place] virus? no  *Contact with a confirmed case should stay at home, away from confirmed patient, monitor symptoms, and reach out to PCP for e-visit/additional testing.  2. Do you have any of the following symptoms [cough, fever (100.4 or greater)], and/or shortness of breath)? No   Pt appt moved to Monday OV w Dr. Rockey Situ at the request of Christell Faith, [place].  ?  Reviewed current clinic policies and procedures r/t virus precautions. Pt agreeable to follow our guidelines.   Attempted to call daughter to review instructions with her as well, No answer. LMOM.

## 2018-12-10 NOTE — Progress Notes (Signed)
Tommi Rumps, MD Phone: [phone]  Diane Mullins is a 85 y.o. female who presents today for follow-up.  Hospital follow-up: Patient was hospitalized from 11/25/2018-11/28/2018 for atrial flutter with RVR.  This was recurrent following her prior hospitalization.  She had been cardioverted successfully at the time of her prior admission though had recurrence of symptoms.  She was on Eliquis and underwent cardioversion during this most recent admission.  She was discharged in sinus rhythm.  She has been started on amiodarone and continues on this.  She continues on Toprol.  She was additionally started on Lasix for CHF.  She continues on that with potassium supplementation.  She overall notes she feels quite a bit better.  She has occasionally had some lightheadedness though this is improved from previously.  She notes no syncope.  She notes no weight gain.  No palpitations, bleeding, chest pain, shortness of breath, or edema.  She does note some constipation that is been a chronic issue.  Her last good bowel movement was about 10 days ago.  She notes she passes little balls of stool each day.  She did have some vomiting 3 days ago of nonbloody vomitus that followed taking MiraLAX.  Some nausea.  No blood in her stool.  She has been using a suppository with little benefit other than passing gas.  No diarrhea.  No abdominal pain.  No bloating.  Her diabetes was found to be quite uncontrolled.  She continues on Lantus 17 units daily and metformin.  She has been off of her glimepiride.  She notes when she is checking it at home her sugars are 99-140.  She was on Jardiance previously though she had renal issues following taking this medication.  She does note occasional hypoglycemic symptoms in the morning when she will have some nausea and not feel good.  She will eat something and feel better.  Discharge summary reviewed.  Medications reviewed.  Social History   Tobacco Use  Smoking Status Never Smoker   Smokeless Tobacco Never Used     ROS see history of present illness  Objective  Physical Exam Vitals:   12/10/18 1343  BP: 140/66  Pulse: 70  Temp: 97.8 F (36.6 C)  SpO2: 96%    BP Readings from Last 3 Encounters:  12/10/18 140/66  11/28/18 (!) 131/57  10/28/18 132/66   Wt Readings from Last 3 Encounters:  11/28/18 204 lb 8 oz (92.8 kg)  10/28/18 204 lb 3.2 oz (92.6 kg)  10/02/18 206 lb 14.4 oz (93.8 kg)    Physical Exam Constitutional:      General: She is not in acute distress.    Appearance: She is not diaphoretic.  Cardiovascular:     Rate and Rhythm: Normal rate and regular rhythm.     Heart sounds: Normal heart sounds.  Pulmonary:     Effort: Pulmonary effort is normal.     Breath sounds: Normal breath sounds.  Abdominal:     General: Bowel sounds are normal. There is no distension.     Palpations: Abdomen is soft.     Tenderness: There is no abdominal tenderness. There is no guarding or rebound.  Musculoskeletal:     Right lower leg: No edema.     Left lower leg: No edema.  Skin:    General: Skin is warm and dry.  Neurological:     Mental Status: She is alert.      Assessment/Plan: Please see individual problem list.  Atrial flutter ([place]) Sinus  rhythm at this time.  Seems to be improving from a symptomatic perspective.  She will continue her on her current regimen.  She will see cardiology as planned.  Given return precautions.  (HFpEF) heart failure with preserved ejection fraction (HCC) Appears to be euvolemic.  We will check a BMP.  She will continue her current Lasix medication.  Diabetes ([place]) Uncontrolled.  We will recheck kidney function.  Discussed potentially adding an additional medication once her lab work returns.  Constipation Chronic issue.  Potentially worsened by amiodarone after review of up-to-date.  Benign abdominal exam.  Discussed adding Metamucil to her daily regimen to see if that improves her symptoms.  If not  improving she can contact us.   Orders Placed This Encounter  Procedures  . CBC  . Basic Metabolic Panel (BMET)    No orders of the defined types were placed in this encounter.    Tommi Rumps, MD [place]

## 2018-12-10 NOTE — Assessment & Plan Note (Signed)
Uncontrolled.  We will recheck kidney function.  Discussed potentially adding an additional medication once her lab work returns.

## 2018-12-10 NOTE — Assessment & Plan Note (Signed)
Sinus rhythm at this time.  Seems to be improving from a symptomatic perspective.  She will continue her on her current regimen.  She will see cardiology as planned.  Given return precautions.

## 2018-12-10 NOTE — Assessment & Plan Note (Signed)
Appears to be euvolemic.  We will check a BMP.  She will continue her current Lasix medication.

## 2018-12-10 NOTE — Patient Instructions (Signed)
Nice to see you. Please start using Metamucil daily to help with constipation. We will get lab work today and contact you with the results. If you develop recurrence of your symptoms it took you to the hospital please be evaluated immediately. Please see cardiology as planned.

## 2018-12-10 NOTE — Assessment & Plan Note (Signed)
Chronic issue.  Potentially worsened by amiodarone after review of up-to-date.  Benign abdominal exam.  Discussed adding Metamucil to her daily regimen to see if that improves her symptoms.  If not improving she can contact us.

## 2018-12-11 ENCOUNTER — Emergency Department: Payer: PPO

## 2018-12-11 ENCOUNTER — Telehealth: Payer: Self-pay | Admitting: Family Medicine

## 2018-12-11 ENCOUNTER — Inpatient Hospital Stay
Admission: EM | Admit: 2018-12-11 | Discharge: 2018-12-16 | DRG: 377 | Disposition: A | Discharge status: Skilled Nursing Facility | Payer: PPO | Attending: Internal Medicine | Admitting: Internal Medicine

## 2018-12-11 ENCOUNTER — Other Ambulatory Visit: Payer: Self-pay

## 2018-12-11 ENCOUNTER — Encounter: Payer: Self-pay | Admitting: Emergency Medicine

## 2018-12-11 DIAGNOSIS — E1165 Type 2 diabetes mellitus with hyperglycemia: Secondary | ICD-10-CM | POA: Diagnosis present

## 2018-12-11 DIAGNOSIS — Z8249 Family history of ischemic heart disease and other diseases of the circulatory system: Secondary | ICD-10-CM

## 2018-12-11 DIAGNOSIS — Z800 Family history of malignant neoplasm of digestive organs: Secondary | ICD-10-CM

## 2018-12-11 DIAGNOSIS — D271 Benign neoplasm of left ovary: Secondary | ICD-10-CM | POA: Diagnosis not present

## 2018-12-11 DIAGNOSIS — K449 Diaphragmatic hernia without obstruction or gangrene: Secondary | ICD-10-CM | POA: Diagnosis not present

## 2018-12-11 DIAGNOSIS — K921 Melena: Secondary | ICD-10-CM | POA: Diagnosis not present

## 2018-12-11 DIAGNOSIS — I272 Pulmonary hypertension, unspecified: Secondary | ICD-10-CM | POA: Diagnosis present

## 2018-12-11 DIAGNOSIS — Z833 Family history of diabetes mellitus: Secondary | ICD-10-CM

## 2018-12-11 DIAGNOSIS — D500 Iron deficiency anemia secondary to blood loss (chronic): Secondary | ICD-10-CM | POA: Diagnosis present

## 2018-12-11 DIAGNOSIS — I483 Typical atrial flutter: Secondary | ICD-10-CM | POA: Diagnosis not present

## 2018-12-11 DIAGNOSIS — Z79890 Hormone replacement therapy (postmenopausal): Secondary | ICD-10-CM | POA: Diagnosis not present

## 2018-12-11 DIAGNOSIS — K8020 Calculus of gallbladder without cholecystitis without obstruction: Secondary | ICD-10-CM | POA: Diagnosis not present

## 2018-12-11 DIAGNOSIS — Z885 Allergy status to narcotic agent status: Secondary | ICD-10-CM

## 2018-12-11 DIAGNOSIS — E039 Hypothyroidism, unspecified: Secondary | ICD-10-CM | POA: Diagnosis present

## 2018-12-11 DIAGNOSIS — R4781 Slurred speech: Secondary | ICD-10-CM | POA: Diagnosis not present

## 2018-12-11 DIAGNOSIS — Z79899 Other long term (current) drug therapy: Secondary | ICD-10-CM

## 2018-12-11 DIAGNOSIS — I480 Paroxysmal atrial fibrillation: Secondary | ICD-10-CM | POA: Diagnosis not present

## 2018-12-11 DIAGNOSIS — K5790 Diverticulosis of intestine, part unspecified, without perforation or abscess without bleeding: Secondary | ICD-10-CM | POA: Diagnosis not present

## 2018-12-11 DIAGNOSIS — K922 Gastrointestinal hemorrhage, unspecified: Secondary | ICD-10-CM | POA: Diagnosis not present

## 2018-12-11 DIAGNOSIS — I2510 Atherosclerotic heart disease of native coronary artery without angina pectoris: Secondary | ICD-10-CM | POA: Diagnosis present

## 2018-12-11 DIAGNOSIS — I110 Hypertensive heart disease with heart failure: Secondary | ICD-10-CM | POA: Diagnosis present

## 2018-12-11 DIAGNOSIS — Z8673 Personal history of transient ischemic attack (TIA), and cerebral infarction without residual deficits: Secondary | ICD-10-CM

## 2018-12-11 DIAGNOSIS — Z6835 Body mass index (BMI) 35.0-35.9, adult: Secondary | ICD-10-CM

## 2018-12-11 DIAGNOSIS — Z7901 Long term (current) use of anticoagulants: Secondary | ICD-10-CM | POA: Diagnosis not present

## 2018-12-11 DIAGNOSIS — Z794 Long term (current) use of insulin: Secondary | ICD-10-CM

## 2018-12-11 DIAGNOSIS — K648 Other hemorrhoids: Secondary | ICD-10-CM | POA: Diagnosis not present

## 2018-12-11 DIAGNOSIS — E785 Hyperlipidemia, unspecified: Secondary | ICD-10-CM | POA: Diagnosis present

## 2018-12-11 DIAGNOSIS — Z7902 Long term (current) use of antithrombotics/antiplatelets: Secondary | ICD-10-CM

## 2018-12-11 DIAGNOSIS — D649 Anemia, unspecified: Secondary | ICD-10-CM | POA: Diagnosis not present

## 2018-12-11 DIAGNOSIS — R270 Ataxia, unspecified: Secondary | ICD-10-CM | POA: Diagnosis not present

## 2018-12-11 DIAGNOSIS — I639 Cerebral infarction, unspecified: Secondary | ICD-10-CM | POA: Diagnosis not present

## 2018-12-11 DIAGNOSIS — R410 Disorientation, unspecified: Secondary | ICD-10-CM | POA: Diagnosis not present

## 2018-12-11 DIAGNOSIS — I5033 Acute on chronic diastolic (congestive) heart failure: Secondary | ICD-10-CM | POA: Diagnosis present

## 2018-12-11 DIAGNOSIS — Z9181 History of falling: Secondary | ICD-10-CM | POA: Diagnosis not present

## 2018-12-11 DIAGNOSIS — K5730 Diverticulosis of large intestine without perforation or abscess without bleeding: Secondary | ICD-10-CM | POA: Diagnosis present

## 2018-12-11 DIAGNOSIS — R42 Dizziness and giddiness: Secondary | ICD-10-CM | POA: Diagnosis not present

## 2018-12-11 DIAGNOSIS — E119 Type 2 diabetes mellitus without complications: Secondary | ICD-10-CM | POA: Diagnosis not present

## 2018-12-11 DIAGNOSIS — D126 Benign neoplasm of colon, unspecified: Secondary | ICD-10-CM | POA: Diagnosis not present

## 2018-12-11 DIAGNOSIS — K644 Residual hemorrhoidal skin tags: Secondary | ICD-10-CM | POA: Diagnosis present

## 2018-12-11 DIAGNOSIS — Z807 Family history of other malignant neoplasms of lymphoid, hematopoietic and related tissues: Secondary | ICD-10-CM

## 2018-12-11 DIAGNOSIS — E860 Dehydration: Secondary | ICD-10-CM | POA: Diagnosis present

## 2018-12-11 DIAGNOSIS — I6621 Occlusion and stenosis of right posterior cerebral artery: Secondary | ICD-10-CM | POA: Diagnosis not present

## 2018-12-11 DIAGNOSIS — I700 Atherosclerosis of aorta: Secondary | ICD-10-CM | POA: Diagnosis not present

## 2018-12-11 DIAGNOSIS — Z7189 Other specified counseling: Secondary | ICD-10-CM | POA: Diagnosis not present

## 2018-12-11 DIAGNOSIS — I6782 Cerebral ischemia: Secondary | ICD-10-CM | POA: Diagnosis not present

## 2018-12-11 DIAGNOSIS — I4892 Unspecified atrial flutter: Secondary | ICD-10-CM | POA: Diagnosis present

## 2018-12-11 DIAGNOSIS — E538 Deficiency of other specified B group vitamins: Secondary | ICD-10-CM | POA: Diagnosis present

## 2018-12-11 DIAGNOSIS — Z7982 Long term (current) use of aspirin: Secondary | ICD-10-CM | POA: Diagnosis not present

## 2018-12-11 DIAGNOSIS — K5900 Constipation, unspecified: Secondary | ICD-10-CM | POA: Diagnosis not present

## 2018-12-11 DIAGNOSIS — R531 Weakness: Secondary | ICD-10-CM | POA: Diagnosis not present

## 2018-12-11 DIAGNOSIS — I4891 Unspecified atrial fibrillation: Secondary | ICD-10-CM | POA: Diagnosis not present

## 2018-12-11 DIAGNOSIS — K635 Polyp of colon: Secondary | ICD-10-CM | POA: Diagnosis present

## 2018-12-11 LAB — CBC
HCT: 25.3 % — ABNORMAL LOW (ref 36.0–46.0)
HCT: 25.5 % — ABNORMAL LOW (ref 36.0–46.0)
Hemoglobin: 8.2 g/dL — ABNORMAL LOW (ref 12.0–15.0)
Hemoglobin: 7.8 g/dL — ABNORMAL LOW (ref 12.0–15.0)
MCH: 27.6 pg (ref 26.0–34.0)
MCHC: 32.5 g/dL (ref 30.0–36.0)
MCHC: 30.6 g/dL (ref 30.0–36.0)
MCV: 88 fl (ref 78.0–100.0)
MCV: 90.1 fL (ref 80.0–100.0)
Platelets: 295 10*3/uL (ref 150.0–400.0)
Platelets: 332 10*3/uL (ref 150–400)
RBC: 2.88 Mil/uL — ABNORMAL LOW (ref 3.87–5.11)
RBC: 2.83 MIL/uL — ABNORMAL LOW (ref 3.87–5.11)
RDW: 17.4 % — ABNORMAL HIGH (ref 11.5–15.5)
RDW: 16.9 % — ABNORMAL HIGH (ref 11.5–15.5)
WBC: 9.2 10*3/uL (ref 4.0–10.5)
WBC: 10.2 10*3/uL (ref 4.0–10.5)
nRBC: 0.2 % (ref 0.0–0.2)

## 2018-12-11 LAB — BASIC METABOLIC PANEL WITH GFR
BUN: 24 mg/dL — ABNORMAL HIGH (ref 6–23)
CO2: 23 meq/L (ref 19–32)
Calcium: 8.9 mg/dL (ref 8.4–10.5)
Chloride: 101 meq/L (ref 96–112)
Creatinine, Ser: 1.1 mg/dL (ref 0.40–1.20)
GFR: 47.3 mL/min — ABNORMAL LOW
Glucose, Bld: 179 mg/dL — ABNORMAL HIGH (ref 70–99)
Potassium: 4.4 meq/L (ref 3.5–5.1)
Sodium: 140 meq/L (ref 135–145)

## 2018-12-11 LAB — PROTIME-INR
INR: 1.3 — ABNORMAL HIGH (ref 0.8–1.2)
Prothrombin Time: 16 seconds — ABNORMAL HIGH (ref 11.4–15.2)

## 2018-12-11 LAB — GLUCOSE, CAPILLARY: Glucose-Capillary: 103 mg/dL — ABNORMAL HIGH (ref 70–99)

## 2018-12-11 LAB — PHOSPHORUS: Phosphorus: 3.1 mg/dL (ref 2.5–4.6)

## 2018-12-11 LAB — BASIC METABOLIC PANEL
Anion gap: 14 (ref 5–15)
BUN: 21 mg/dL (ref 8–23)
CO2: 22 mmol/L (ref 22–32)
CREATININE: 1.09 mg/dL — AB (ref 0.44–1.00)
Calcium: 8.8 mg/dL — ABNORMAL LOW (ref 8.9–10.3)
Chloride: 103 mmol/L (ref 98–111)
GFR calc Af Amer: 54 mL/min — ABNORMAL LOW (ref >60)
GFR calc non Af Amer: 47 mL/min — ABNORMAL LOW (ref >60)
GLUCOSE: 157 mg/dL — AB (ref 70–99)
Potassium: 4.3 mmol/L (ref 3.5–5.1)
Sodium: 139 mmol/L (ref 135–145)

## 2018-12-11 LAB — HEPATIC FUNCTION PANEL
ALT: 11 U/L (ref 0–44)
AST: 20 U/L (ref 15–41)
Albumin: 3.7 g/dL (ref 3.5–5.0)
Alkaline Phosphatase: 63 U/L (ref 38–126)
Bilirubin, Direct: <0.1 mg/dL (ref 0.0–0.2)
Total Bilirubin: 0.7 mg/dL (ref 0.3–1.2)
Total Protein: 6.7 g/dL (ref 6.5–8.1)

## 2018-12-11 LAB — PREPARE RBC (CROSSMATCH)

## 2018-12-11 LAB — TROPONIN I: Troponin I: <0.03 ng/mL (ref <0.03)

## 2018-12-11 LAB — ABO/RH: ABO/RH(D): O POS

## 2018-12-11 LAB — APTT: aPTT: 30 seconds (ref 24–36)

## 2018-12-11 LAB — MAGNESIUM: Magnesium: 1.6 mg/dL — ABNORMAL LOW (ref 1.7–2.4)

## 2018-12-11 MED ORDER — SODIUM CHLORIDE 0.9 % IV SOLN
80.0000 mg | Freq: Once | INTRAVENOUS | Status: AC
  Administered 2018-12-11: 80 mg via INTRAVENOUS
  Filled 2018-12-11: qty 80

## 2018-12-11 MED ORDER — SIMVASTATIN 20 MG PO TABS
20.0000 mg | ORAL_TABLET | Freq: Every evening | ORAL | Status: DC
  Administered 2018-12-11 – 2018-12-15 (×5): 20 mg via ORAL
  Filled 2018-12-11 (×5): qty 1

## 2018-12-11 MED ORDER — IOHEXOL 300 MG/ML  SOLN
100.0000 mL | Freq: Once | INTRAMUSCULAR | Status: AC | PRN
  Administered 2018-12-11: 100 mL via INTRAVENOUS

## 2018-12-11 MED ORDER — SENNOSIDES-DOCUSATE SODIUM 8.6-50 MG PO TABS: 1.0000 | ORAL_TABLET | Freq: Every evening | ORAL | Status: DC | PRN

## 2018-12-11 MED ORDER — APIXABAN 5 MG PO TABS: 5.0000 mg | ORAL_TABLET | Freq: Two times a day (BID) | ORAL | Status: DC

## 2018-12-11 MED ORDER — ASPIRIN EC 81 MG PO TBEC: 81.0000 mg | DELAYED_RELEASE_TABLET | Freq: Every day | ORAL | Status: DC

## 2018-12-11 MED ORDER — BISACODYL 5 MG PO TBEC: 5.0000 mg | DELAYED_RELEASE_TABLET | Freq: Every day | ORAL | Status: DC | PRN

## 2018-12-11 MED ORDER — ONDANSETRON HCL 4 MG PO TABS: 4.0000 mg | ORAL_TABLET | Freq: Four times a day (QID) | ORAL | Status: DC | PRN

## 2018-12-11 MED ORDER — ACETAMINOPHEN 325 MG PO TABS: 650.0000 mg | ORAL_TABLET | Freq: Four times a day (QID) | ORAL | Status: DC | PRN

## 2018-12-11 MED ORDER — METOPROLOL SUCCINATE ER 25 MG PO TB24
25.0000 mg | ORAL_TABLET | Freq: Every day | ORAL | Status: DC
  Administered 2018-12-12 – 2018-12-13 (×2): 25 mg via ORAL
  Filled 2018-12-11 (×3): qty 1

## 2018-12-11 MED ORDER — POLYETHYLENE GLYCOL 3350 17 G PO PACK
17.0000 g | PACK | Freq: Every day | ORAL | Status: DC
  Administered 2018-12-12 – 2018-12-15 (×2): 17 g via ORAL
  Filled 2018-12-11 (×4): qty 1

## 2018-12-11 MED ORDER — ONDANSETRON HCL 4 MG/2ML IJ SOLN: 4.0000 mg | Freq: Four times a day (QID) | INTRAMUSCULAR | Status: DC | PRN

## 2018-12-11 MED ORDER — PANTOPRAZOLE SODIUM 40 MG IV SOLR: 40.0000 mg | Freq: Two times a day (BID) | INTRAVENOUS | Status: DC

## 2018-12-11 MED ORDER — LEVOTHYROXINE SODIUM 50 MCG PO TABS
75.0000 ug | ORAL_TABLET | Freq: Every day | ORAL | Status: DC
  Administered 2018-12-12 – 2018-12-16 (×4): 75 ug via ORAL
  Filled 2018-12-11 (×5): qty 1

## 2018-12-11 MED ORDER — ACETAMINOPHEN 650 MG RE SUPP: 650.0000 mg | Freq: Four times a day (QID) | RECTAL | Status: DC | PRN

## 2018-12-11 MED ORDER — SODIUM CHLORIDE 0.9 % IV SOLN
8.0000 mg/h | INTRAVENOUS | Status: DC
  Administered 2018-12-11: 8 mg/h via INTRAVENOUS
  Filled 2018-12-11: qty 80

## 2018-12-11 MED ORDER — AMIODARONE HCL 200 MG PO TABS
200.0000 mg | ORAL_TABLET | Freq: Three times a day (TID) | ORAL | Status: DC
  Administered 2018-12-11 – 2018-12-12 (×2): 200 mg via ORAL
  Filled 2018-12-11 (×2): qty 1

## 2018-12-11 MED ORDER — SODIUM CHLORIDE 0.9 % IV BOLUS
500.0000 mL | Freq: Once | INTRAVENOUS | Status: AC
  Administered 2018-12-11: 500 mL via INTRAVENOUS

## 2018-12-11 MED ORDER — SODIUM CHLORIDE 0.9 % IV SOLN
10.0000 mL/h | Freq: Once | INTRAVENOUS | Status: AC
  Administered 2018-12-11: 10 mL/h via INTRAVENOUS

## 2018-12-11 MED ORDER — INSULIN GLARGINE 100 UNIT/ML ~~LOC~~ SOLN
10.0000 [IU] | Freq: Every morning | SUBCUTANEOUS | Status: DC
  Administered 2018-12-13 – 2018-12-16 (×4): 10 [IU] via SUBCUTANEOUS
  Filled 2018-12-11 (×6): qty 0.1

## 2018-12-11 MED ORDER — INSULIN ASPART 100 UNIT/ML ~~LOC~~ SOLN
0.0000 [IU] | Freq: Three times a day (TID) | SUBCUTANEOUS | Status: DC
  Administered 2018-12-12: 12:00:00 1 [IU] via SUBCUTANEOUS
  Filled 2018-12-11: qty 1

## 2018-12-11 NOTE — Telephone Encounter (Signed)
Called and spoke with pt's daughter she plans to take mother to the ED after 5 and called [place] [facility] to inform them that pt is on there way to ED.

## 2018-12-11 NOTE — Telephone Encounter (Signed)
Patient's daughter, Shirlean Mylar, requesting a call from Dr. Caryl Bis or CMA to discuss message below and what needs to happen next. Please advise.

## 2018-12-11 NOTE — Telephone Encounter (Signed)
Copied from [place] [phone]. Topic: Quick Communication - Home Health Verbal Orders >> Dec 11, 2018 11:36 AM Berneta Levins wrote: Caller/Agency: Marlowe Kays with [facility] Number: [phone] Requesting OT/PT/Skilled Nursing/Social Work/Speech Therapy: OT Frequency: 2x a week for three weeks

## 2018-12-11 NOTE — Telephone Encounter (Signed)
Called and spoke with pt's daughter. Daughter advised and stated that she will see if she is able to take her mother to the ED today if not she stated that she would call us due to transportation issues and trying to avoid the EMS to come and pick her up. I will call pt's daughter later to confirm that pt went to ED.

## 2018-12-11 NOTE — Telephone Encounter (Signed)
Noted. Please call the patients daughter and let her know the patient needs to go to the ED for evaluation given the low hemoglobin and the complaints noted in the message below.

## 2018-12-11 NOTE — ED Notes (Signed)
ED TO INPATIENT HANDOFF REPORT  ED Nurse Name and Phone #:  Charolett Bumpers  [phone]  S Name/Age/Gender Diane Mullins 85 y.o. female Room/Bed: ED10A/ED10A  Code Status   Code Status: Prior  Home/SNF/Other Home, [facility] Patient oriented to: self, place, time and situation Is this baseline? Yes   Triage Complete: Triage complete  Chief Complaint Abnormal Labs  Triage Note Pt in via POV, advised to be seen per PCP due to anemia.  Pt reports ongoing dizziness, light headedness, near syncopal episodes x approximately 2 weeks.  Per PCP office, Hgb 8.6.  Pt A/Ox4, pale in color, NAD noted at this time.   Allergies Allergies  Allergen Reactions  . Oxycodone     Other reaction(s): Confusion Patient's daughter reported confusion/sensitivity to oxycodone.     Level of Care/Admitting Diagnosis ED Disposition    ED Disposition Condition [facility] Area: [place] [[id number]]  Level of Care: Med-Surg [16]  Diagnosis: Melena [[id number]]  Admitting Physician: Arta Silence [[id number]]  Attending Physician: Arta Silence [[id number]]  Estimated length of stay: past midnight tomorrow  Certification:: I certify this patient will need inpatient services for at least 2 midnights  PT Class (Do Not Modify): Inpatient [101]  PT Acc Code (Do Not Modify): Private [1]       B Medical/Surgery History Past Medical History:  Diagnosis Date  . Atrial fibrillation ([place])   . Benign paroxysmal positional vertigo 07/17/2014  . Chronic diastolic CHF (congestive heart failure) ([place])    a. echo 10/19: EF of 60-65%, no RWMA, Gr2DD, calcified mitral annulus with mild MR, mildly to moderately dilated left atrium, moderate TR, PASP 52 mmHg  . Diabetes mellitus   . H/O: rheumatic fever   . Hyperlipidemia   . Hypertension   . Pulmonary hypertension ([place])   . Stroke [facility])    TIA Jan. 1st  . Teratoma of left ovary   . Thyroid  disease    Past Surgical History:  Procedure Laterality Date  . ABDOMINAL HYSTERECTOMY  1973   menorrhagia  . CARDIOVERSION N/A 10/22/2018   Procedure: CARDIOVERSION;  Surgeon: Nelva Bush, MD;  Location: [facility] ORS;  Service: Cardiovascular;  Laterality: N/A;  . CARDIOVERSION N/A 11/26/2018   Procedure: CARDIOVERSION (CATH LAB);  Surgeon: Minna Merritts, MD;  Location: [facility] ORS;  Service: Cardiovascular;  Laterality: N/A;  . [place]  . ORIF ANKLE FRACTURE Right 10/29/2015   Procedure: OPEN REDUCTION INTERNAL FIXATION (ORIF) ANKLE FRACTURE;  Surgeon: Thornton Park, MD;  Location: [facility] ORS;  Service: Orthopedics;  Laterality: Right;  . TEE WITHOUT CARDIOVERSION N/A 10/22/2018   Procedure: TRANSESOPHAGEAL ECHOCARDIOGRAM (TEE);  Surgeon: Nelva Bush, MD;  Location: [facility] ORS;  Service: Cardiovascular;  Laterality: N/A;  . VAGINAL DELIVERY     x4     A IV Location/Drains/Wounds Patient Lines/Drains/Airways Status   Active Line/Drains/Airways    Name:   Placement date:   Placement time:   Site:   Days:   Peripheral IV 12/11/18 Right Antecubital   12/11/18    1912    Antecubital   less than 1          Intake/Output Last 24 hours  Intake/Output Summary (Last 24 hours) at 12/11/2018 2056 Last data filed at 12/11/2018 2031 Gross per 24 hour  Intake 1166.67 ml  Output -  Net 1166.67 ml    Labs/Imaging Results for orders placed or performed during the hospital encounter of 12/11/18 (from the  past 48 hour(s))  Basic metabolic panel     Status: Abnormal   Collection Time: 12/11/18  6:05 PM  Result Value Ref Range   Sodium 139 135 - 145 mmol/L   Potassium 4.3 3.5 - 5.1 mmol/L   Chloride 103 98 - 111 mmol/L   CO2 22 22 - 32 mmol/L   Glucose, Bld 157 (H) 70 - 99 mg/dL   BUN 21 8 - 23 mg/dL   Creatinine, Ser 1.09 (H) 0.44 - 1.00 mg/dL   Calcium 8.8 (L) 8.9 - 10.3 mg/dL   GFR calc non Af Amer 47 (L) >60 mL/min   GFR calc Af Amer 54 (L) >60 mL/min   Anion gap 14 5  - 15    Comment: Performed at [facility], [address] [place], [place] [address]  CBC     Status: Abnormal   Collection Time: 12/11/18  6:05 PM  Result Value Ref Range   WBC 10.2 4.0 - 10.5 K/uL   RBC 2.83 (L) 3.87 - 5.11 MIL/uL   Hemoglobin 7.8 (L) 12.0 - 15.0 g/dL   HCT 25.5 (L) 36.0 - 46.0 %   MCV 90.1 80.0 - 100.0 fL   MCH 27.6 26.0 - 34.0 pg   MCHC 30.6 30.0 - 36.0 g/dL   RDW 16.9 (H) 11.5 - 15.5 %   Platelets 332 150 - 400 K/uL   nRBC 0.2 0.0 - 0.2 %    Comment: Performed at [facility], [address] [place], [place] [address]  Troponin I - Add-On to previous collection     Status: None   Collection Time: 12/11/18  6:05 PM  Result Value Ref Range   Troponin I <0.03 <0.03 ng/mL    Comment: Performed at [facility], [address] [place], [place] [address]  ABO/Rh     Status: None   Collection Time: 12/11/18  6:05 PM  Result Value Ref Range   ABO/RH(D)      O POS Performed at [facility], [address] [place], [place] [address]   Protime-INR     Status: Abnormal   Collection Time: 12/11/18  6:58 PM  Result Value Ref Range   Prothrombin Time 16.0 (H) 11.4 - 15.2 seconds   INR 1.3 (H) 0.8 - 1.2    Comment: (NOTE) INR goal varies based on device and disease states. Performed at [facility], [address] [place], [place] [address]   APTT     Status: None   Collection Time: 12/11/18  6:58 PM  Result Value Ref Range   aPTT 30 24 - 36 seconds    Comment: Performed at [facility], [address] [place], [place] [address]  Type and screen [place]     Status: None (Preliminary result)   Collection Time: 12/11/18  6:58 PM  Result Value Ref Range   ABO/RH(D) O POS    Antibody Screen NEG    Sample Expiration 12/14/2018    Unit Number [id number]    Blood Component Type RED CELLS,LR    Unit division 00    Status of Unit ISSUED    Transfusion Status OK TO TRANSFUSE     Crossmatch Result      Compatible Performed at [facility], [address], [place], [place] [address]   Prepare RBC     Status: None   Collection Time: 12/11/18  7:38 PM  Result Value Ref Range   Order Confirmation  ORDER PROCESSED BY BLOOD BANK Performed at [facility], [address] [place], [place] [address]    Ct Abdomen Pelvis W Contrast  Result Date: 12/11/2018 CLINICAL DATA:  Constipation and melena. On anticoagulation with symptomatic anemia. EXAM: CT ABDOMEN AND PELVIS WITH CONTRAST TECHNIQUE: Multidetector CT imaging of the abdomen and pelvis was performed using the standard protocol following bolus administration of intravenous contrast. CONTRAST:  158mL OMNIPAQUE IOHEXOL 300 MG/ML  SOLN COMPARISON:  CT 07/05/2018 FINDINGS: Lower chest: Cardiomegaly. Moderate hiatal hernia with at least 50% of the stomach intrathoracic. No focal airspace disease or pleural fluid. There are coronary artery calcifications. Hepatobiliary: No focal hepatic abnormality. Layering gallstones within physiologically distended gallbladder. No pericholecystic inflammation. No biliary dilatation. Pancreas: No ductal dilatation or inflammation. Spleen: Normal in size without focal abnormality. Adrenals/Urinary Tract: Mild left adrenal thickening without dominant nodule. Normal right adrenal gland. No hydronephrosis or perinephric edema. Homogeneous renal enhancement with symmetric excretion on delayed phase imaging. Urinary bladder is physiologically distended without wall thickening. Stomach/Bowel: Descending and sigmoid colonic diverticulosis, prominent in the sigmoid colon. No diverticulitis. Moderate volume of stool throughout the colon. No colonic wall thickening or inflammatory change. The appendix is not definitively visualized. Small bowel is unremarkable. No small bowel wall thickening or inflammatory change. Moderate hiatal hernia with approximately 50% of the stomach being  intrathoracic. Vascular/Lymphatic: Aortic atherosclerosis without aneurysm. Portal vein is patent. Mesenteric vessels are patent. No adenopathy. Reproductive: Status post hysterectomy. Mixed density left adnexal lesion containing fat, soft tissue density and calcifications measuring 6.4 x 6.0 x 7.0 cm, unchanged allowing for differences in caliper placement. No new adnexal mass. Other: No free air, free fluid, or intra-abdominal fluid collection. Postsurgical change of the anterior abdominal wall. Tiny fat containing umbilical hernia. Musculoskeletal: There are no acute or suspicious osseous abnormalities. Degenerative change throughout spine. Psoas muscles are symmetric without evidence of bleed. IMPRESSION: 1. No acute abnormality in the abdomen/pelvis. 2. Colonic diverticulosis without diverticulitis. Moderate stool burden throughout the colon suggesting constipation. 3. Moderate hiatal hernia with at least 50% of the stomach intrathoracic. 4. Cholelithiasis without gallbladder inflammation. 5. A 7 cm left ovarian dermoid is unchanged from CT 5 months ago. 6. Coronary artery calcifications. Aortic Atherosclerosis (ICD10-I70.0). Electronically Signed   By: Keith Rake M.D.   On: 12/11/2018 20:27    Pending Labs Unresulted Labs (From admission, onward)    Start     Ordered   12/12/18 0500  Hemoglobin  Tomorrow morning,   STAT     12/11/18 2037   12/11/18 2037  Hepatic function panel  Add-on,   AD    Question:  Specimen collection method  Answer:  Unit=Unit collect   12/11/18 2036   12/11/18 2037  Magnesium  Add-on,   AD    Question:  Specimen collection method  Answer:  Unit=Unit collect   12/11/18 2036   12/11/18 2037  Phosphorus  Add-on,   AD    Question:  Specimen collection method  Answer:  Unit=Unit collect   12/11/18 2036   12/11/18 2037  Calcium, ionized  Once,   STAT    Question:  Specimen collection method  Answer:  Unit=Unit collect   12/11/18 2036   12/11/18 1756  Urinalysis,  Complete w Microscopic  ONCE - STAT,   STAT     12/11/18 1755          Vitals/Pain Today's Vitals   12/11/18 1754 12/11/18 1759 12/11/18 1900 12/11/18 2030  BP:  (!) 121/47  Pulse:  73 (!) 59 63  Resp:  16 20 17   Temp:  98 F (36.7 C)    TempSrc:  Oral    SpO2:  98% 98% 97%  Weight: 88 kg     Height: 5\' 2"  (1.575 m)     PainSc: 0-No pain       Isolation Precautions No active isolations  Medications Medications  pantoprazole (PROTONIX) 80 mg in sodium chloride 0.9 % 250 mL (0.32 mg/mL) infusion (has no administration in time range)  pantoprazole (PROTONIX) injection 40 mg (has no administration in time range)  0.9 %  sodium chloride infusion (has no administration in time range)  sodium chloride 0.9 % bolus 500 mL (0 mLs Intravenous Stopped 12/11/18 2031)  pantoprazole (PROTONIX) 80 mg in sodium chloride 0.9 % 100 mL IVPB (80 mg Intravenous New Bag/Given 12/11/18 2032)  iohexol (OMNIPAQUE) 300 MG/ML solution 100 mL (100 mLs Intravenous Contrast Given 12/11/18 2003)    Mobility walks with device Low fall risk   Focused Assessments GI-see charting.   R Recommendations: See Admitting Provider Note  Report given to:   Additional Notes: Positive hemoccult, Hgb 7.8.

## 2018-12-11 NOTE — ED Provider Notes (Signed)
[facility] Emergency Department Provider Note  ____________________________________________  Time seen: Approximately 7:33 PM  I have reviewed the triage vital signs and the nursing notes.   HISTORY  Chief Complaint Dizziness and Abnormal Lab    HPI Diane Mullins is a 85 y.o. female on Eliquis for A. fib, presenting with symptomatic anemia.  The patient reports that for the past week she has had presyncopal episodes where she feels very lightheaded with standing and short of breath.  She went to see her PMD who did basic blood work and found her hemoglobin to be low.  Here, her hemoglobin is 7.8.  She reports 2 weeks of constipation where she has only had a "very small stools" or no stool at all but she has been passing gas.  She has tried MiraLAX and rectal suppositories without improvement.  She has not had any abdominal pain.  She does have a strong family history with her father having died from colon cancer; she used to get routine colonoscopy but several years ago stopped doing that because a neighbor of hers had a complication from colonoscopy.  She has never had endoscopy in the past.  Past Medical History:  Diagnosis Date  . Atrial fibrillation ([place])   . Benign paroxysmal positional vertigo 07/17/2014  . Chronic diastolic CHF (congestive heart failure) ([place])    a. echo 10/19: EF of 60-65%, no RWMA, Gr2DD, calcified mitral annulus with mild MR, mildly to moderately dilated left atrium, moderate TR, PASP 52 mmHg  . Diabetes mellitus   . H/O: rheumatic fever   . Hyperlipidemia   . Hypertension   . Pulmonary hypertension ([place])   . Stroke [facility])    TIA Jan. 1st  . Teratoma of left ovary   . Thyroid disease     Patient Active Problem List   Diagnosis Date Noted  . Constipation 12/10/2018  . Atrial flutter ([place]) 10/17/2018  . 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 AKI (acute kidney injury) ([place])   . Acute on chronic diastolic heart failure ([place])   .  Respiratory failure ([place]) 07/05/2018  . Coronary artery calcification seen on CAT scan 11/16/2017  . Morbid obesity ([place]) 10/30/2017  . (HFpEF) heart failure with preserved ejection fraction ([place]) 09/28/2017  . Family history of colon cancer 04/17/2017  . Closed right ankle fracture 10/28/2015  . Fatigue 10/18/2015  . TIA (transient ischemic attack) 09/18/2015  . Orthostatic hypotension 08/02/2015  . Adjustment disorder with anxious mood 03/30/2015  . Postmenopausal estrogen deficiency 06/03/2014  . DNR (do not resuscitate) discussion 03/04/2014  . Gait disturbance 05/07/2013  . Diabetes ([place]) 04/21/2013  . Shortness of breath 12/09/2012  . Hearing loss 12/09/2012  . Anxiety 01/31/2012  . Hypertension 12/22/2011  . Hyperlipidemia 12/22/2011  . Hypothyroidism 12/22/2011    Past Surgical History:  Procedure Laterality Date  . ABDOMINAL HYSTERECTOMY  1973   menorrhagia  . CARDIOVERSION N/A 10/22/2018   Procedure: CARDIOVERSION;  Surgeon: Nelva Bush, MD;  Location: [facility] ORS;  Service: Cardiovascular;  Laterality: N/A;  . CARDIOVERSION N/A 11/26/2018   Procedure: CARDIOVERSION (CATH LAB);  Surgeon: Minna Merritts, MD;  Location: [facility] ORS;  Service: Cardiovascular;  Laterality: N/A;  . [place]  . ORIF ANKLE FRACTURE Right 10/29/2015   Procedure: OPEN REDUCTION INTERNAL FIXATION (ORIF) ANKLE FRACTURE;  Surgeon: Thornton Park, MD;  Location: [facility] ORS;  Service: Orthopedics;  Laterality: Right;  . TEE WITHOUT CARDIOVERSION N/A 10/22/2018   Procedure: TRANSESOPHAGEAL ECHOCARDIOGRAM (TEE);  Surgeon: Nelva Bush, MD;  Location: [facility] ORS;  Service: Cardiovascular;  Laterality: N/A;  . VAGINAL DELIVERY     x4    Current Outpatient Rx  . Order #: [id number] Class: Historical Med  . Order #: [id number] Class: Normal  . Order #: [id number] Class: Normal  . Order #: [id number] Class: Historical Med  . Order #: [id number] Class: Historical Med  . Reflex Order#: [id number]  [phone]: Normal  . Order #: [id number] Class: Normal  . Order #: [id number] Class: Normal  . Order #: [id number] Class: Normal  . Order #: [id number] Class: Normal  . Order #: [id number] Class: Normal  . Order #: [id number] Class: Normal  . Order #: [id number] Class: Normal  . Order #: [id number] Class: Normal  . Order #: [id number] Class: Historical Med  . Order #: [id number] Class: Normal  . Order #: [id number] Class: Normal    Allergies Oxycodone  Family History  Problem Relation Age of Onset  . Heart disease Mother 94  . Heart attack Mother 49  . Cancer Father        colon  . Diabetes Sister   . Hypertension Sister   . Cancer Brother        kidney  . Diabetes Brother   . Cancer Daughter        ovarian  . Non-Hodgkin's lymphoma Son     Social History Social History   Tobacco Use  . Smoking status: Never Smoker  . Smokeless tobacco: Never Used  Substance Use Topics  . Alcohol use: No  . Drug use: No    Review of Systems Constitutional: No fever/chills.  Positive postural lightheadedness.  Positive presyncope.  No syncope. Eyes: No visual changes. ENT: No sore throat. No congestion or rhinorrhea. Cardiovascular: Denies chest pain. Denies palpitations. Respiratory: Denies shortness of breath.  No cough. Gastrointestinal: No abdominal pain.  Positive abdominal distention no nausea, no vomiting.  No diarrhea.  Passing gas but is having 2 weeks of constipation. Genitourinary: Negative for dysuria. Musculoskeletal: Negative for back pain. Skin: Negative for rash. Neurological: Negative for headaches. No focal numbness, tingling or weakness.     ____________________________________________   PHYSICAL EXAM:  VITAL SIGNS: ED Triage Vitals  Enc Vitals Group     BP 12/11/18 1759 (!) 121/47     Pulse Rate 12/11/18 1759 73     Resp 12/11/18 1759 16     Temp 12/11/18 1759 98 F (36.7 C)     Temp Source 12/11/18 1759 Oral     SpO2 12/11/18 1759 98 %     Weight  12/11/18 1754 194 lb (88 kg)     Height 12/11/18 1754 5\' 2"  (1.575 m)     Head Circumference --      Peak Flow --      Pain Score 12/11/18 1754 0     Pain Loc --      Pain Edu? --      Excl. in [place]? --     Constitutional: Alert and oriented.  Answers questions appropriately.  Chronically ill-appearing. Eyes: Conjunctivae are normal and without pallor.  EOMI. No scleral icterus. Head: Atraumatic. Nose: No congestion/rhinnorhea. Mouth/Throat: Mucous membranes are moist.  Neck: No stridor.  Supple.   Cardiovascular: Normal rate, regular rhythm. No murmurs, rubs or gallops.  Respiratory: Normal respiratory effort.  No accessory muscle use or retractions. Lungs CTAB.  No wheezes, rales or ronchi. Gastrointestinal: Soft, nontender and grossly distended..  No guarding or rebound.  No peritoneal signs. Genitourinary: Evidence of external hemorrhoids or palpable internal hemorrhoids.  The patient has black  stool which is guaiac positive. Musculoskeletal: No LE edema.  Neurologic:  A&Ox3.  Speech is clear.  Face and smile are symmetric.  EOMI.  Moves all extremities well. Skin:  Skin is warm, dry and intact. No rash noted.  No pallor. Psychiatric: Mood and affect are normal. Speech and behavior are normal.  Normal judgement.  ____________________________________________   LABS (all labs ordered are listed, but only abnormal results are displayed)  Labs Reviewed  BASIC METABOLIC PANEL - Abnormal; Notable for the following components:      Result Value   Glucose, Bld 157 (*)    Creatinine, Ser 1.09 (*)    Calcium 8.8 (*)    GFR calc non Af Amer 47 (*)    GFR calc Af Amer 54 (*)    All other components within normal limits  CBC - Abnormal; Notable for the following components:   RBC 2.83 (*)    Hemoglobin 7.8 (*)    HCT 25.5 (*)    RDW 16.9 (*)    All other components within normal limits  PROTIME-INR - Abnormal; Notable for the following components:   Prothrombin Time 16.0 (*)     INR 1.3 (*)    All other components within normal limits  APTT  URINALYSIS, COMPLETE (UACMP) WITH MICROSCOPIC  TROPONIN I  CBG MONITORING, ED  TYPE AND SCREEN   ____________________________________________  EKG  ED ECG REPORT I, Anne-Caroline Mariea Clonts, the attending physician, personally viewed and interpreted this ECG.   Date: 12/11/2018  EKG Time: 1755  Rate: 71  Rhythm: normal sinus rhythm  Axis: leftward  Intervals:none  ST&T Change: No STEMI  ____________________________________________  RADIOLOGY  No results found.  ____________________________________________   PROCEDURES  Procedure(s) performed: None  Procedures  Critical Care performed: Yes, see critical care note(s) ____________________________________________   INITIAL IMPRESSION / ASSESSMENT AND PLAN / ED COURSE  Pertinent labs & imaging results that were available during my care of the patient were reviewed by me and considered in my medical decision making (see chart for details).  85 y.o. female on Eliquis for A. fib with a strong family history of colon cancer presenting with symptomatic anemia, melena.  Overall, I am concerned that the patient is having a GI bleed, including the possibility of an upper GI bleed and Protonix has been ordered.  She does meet criteria for transfusion but given the blood shortage, will order 1 unit of blood and she will be reevaluated for further transfusion by the hospitalist.  The patient is at risk for bleeding due to her Eliquis, and I am also concerned that with constipation she may have a intestinal mass that is preventing flow.  A CT scan has been ordered and will be followed up by the hospitalist.  I have spoken with Dr. Alice Reichert, the gastrointestinal physician on-call, who will consult on the patient.  At this time, the patient is hemodynamically stable and no acute interventions other than her transfusion are indicated.  The patient will be admitted at this  time.  CRITICAL CARE Performed by: Eula Listen   Total critical care time: 35 minutes  Critical care time was exclusive of separately billable procedures and treating other patients.  Critical care was necessary to treat or prevent imminent or life-threatening deterioration.  Critical care was time spent personally by me on the following activities: development of treatment plan with patient and/or surrogate as well as nursing, discussions with consultants, evaluation of patient's response to treatment, examination of patient, obtaining history from  patient or surrogate, ordering and performing treatments and interventions, ordering and review of laboratory studies, ordering and review of radiographic studies, pulse oximetry and re-evaluation of patient's condition.   ____________________________________________  FINAL CLINICAL IMPRESSION(S) / ED DIAGNOSES  Final diagnoses:  Symptomatic anemia  Gastrointestinal hemorrhage with melena  Constipation, unspecified constipation type         NEW MEDICATIONS STARTED DURING THIS VISIT:  New Prescriptions   No medications on file      Eula Listen, MD 12/11/18 1941

## 2018-12-11 NOTE — ED Notes (Signed)
Allison RN, aware of bed assigned  

## 2018-12-11 NOTE — ED Triage Notes (Signed)
Pt in via POV, advised to be seen per PCP due to anemia.  Pt reports ongoing dizziness, light headedness, near syncopal episodes x approximately 2 weeks.  Per PCP office, Hgb 8.6.  Pt A/Ox4, pale in color, NAD noted at this time.

## 2018-12-11 NOTE — H&P (Addendum)
[facility] [place] NAME: Diane Mullins    MR#:  [id number]  DATE OF BIRTH:  August 26, 1933  DATE OF ADMISSION:  12/11/2018  PRIMARY CARE PHYSICIAN: [name], MD   REQUESTING/REFERRING PHYSICIAN: Eula Listen, MD  CHIEF COMPLAINT:   Chief Complaint  Patient presents with  . Dizziness  . Abnormal Lab    HISTORY OF PRESENT ILLNESS:  Diane Mullins  is a 85 y.o. female with a known history of Afib (Eliquis), chronic diastolic CHF/HFpEF (EF 60-10% w/ grade II diastolic dysfxn as of 93/23/5573 Echo), p/w constipation, lightheadedness/near-syncope, anemia. Pt endorse 2wk Hx constipation. States her stools have been like "pills". Endorses lightheadedness/near-syncope when standing from a sitting position; she states at one point she tried to stand, but "flopped" back into the chair. She denies frank LOC. Asked her daughter to bring her to the ED due to persistence of aforementioned symptoms, as well as dehydration and fatigue/malaise/generalized weakness.  Some mention of melena was made to me in passing, though this would likely be inconsistent w/ a 2wk Hx of constipation. Hgb is low (7.8). Pt denies having witnessed passage of red blood in stool. She also denies abdominal pain. Last colonoscopy 08/2010 (Dr. Oletta Lamas, [place]); I am unable to access the report. On Eliquis at home for Afib; presently in sinus, HR 60s. Appears well.  PAST MEDICAL HISTORY:   Past Medical History:  Diagnosis Date  . Atrial fibrillation ([place])   . Benign paroxysmal positional vertigo 07/17/2014  . Chronic diastolic CHF (congestive heart failure) ([place])    a. echo 10/19: EF of 60-65%, no RWMA, Gr2DD, calcified mitral annulus with mild MR, mildly to moderately dilated left atrium, moderate TR, PASP 52 mmHg  . Diabetes mellitus   . H/O: rheumatic fever   . Hyperlipidemia   . Hypertension   . Pulmonary hypertension ([place])   . Stroke [facility])    TIA Jan. 1st   . Teratoma of left ovary   . Thyroid disease     PAST SURGICAL HISTORY:   Past Surgical History:  Procedure Laterality Date  . ABDOMINAL HYSTERECTOMY  1973   menorrhagia  . CARDIOVERSION N/A 10/22/2018   Procedure: CARDIOVERSION;  Surgeon: Nelva Bush, MD;  Location: [facility] ORS;  Service: Cardiovascular;  Laterality: N/A;  . CARDIOVERSION N/A 11/26/2018   Procedure: CARDIOVERSION (CATH LAB);  Surgeon: Minna Merritts, MD;  Location: [facility] ORS;  Service: Cardiovascular;  Laterality: N/A;  . [place]  . ORIF ANKLE FRACTURE Right 10/29/2015   Procedure: OPEN REDUCTION INTERNAL FIXATION (ORIF) ANKLE FRACTURE;  Surgeon: Thornton Park, MD;  Location: [facility] ORS;  Service: Orthopedics;  Laterality: Right;  . TEE WITHOUT CARDIOVERSION N/A 10/22/2018   Procedure: TRANSESOPHAGEAL ECHOCARDIOGRAM (TEE);  Surgeon: Nelva Bush, MD;  Location: [facility] ORS;  Service: Cardiovascular;  Laterality: N/A;  . VAGINAL DELIVERY     x4    SOCIAL HISTORY:   Social History   Tobacco Use  . Smoking status: Never Smoker  . Smokeless tobacco: Never Used  Substance Use Topics  . Alcohol use: No    FAMILY HISTORY:   Family History  Problem Relation Age of Onset  . Heart disease Mother 22  . Heart attack Mother 104  . Cancer Father        colon  . Diabetes Sister   . Hypertension Sister   . Cancer Brother        kidney  . Diabetes Brother   . Cancer  Daughter        ovarian  . Non-Hodgkin's lymphoma Son     DRUG ALLERGIES:   Allergies  Allergen Reactions  . Oxycodone     Other reaction(s): Confusion Patient's daughter reported confusion/sensitivity to oxycodone.     REVIEW OF SYSTEMS:   Review of Systems  Constitutional: Positive for malaise/fatigue. Negative for chills, diaphoresis, fever and weight loss.  HENT: Negative for congestion, hearing loss, nosebleeds, sinus pain, sore throat and tinnitus.   Eyes: Negative for blurred vision, double vision and photophobia.   Respiratory: Negative for cough, hemoptysis, sputum production, shortness of breath and wheezing.   Cardiovascular: Negative for chest pain, palpitations, orthopnea, claudication, leg swelling and PND.  Gastrointestinal: Positive for constipation. Negative for blood in stool and melena.  Genitourinary: Negative for dysuria, frequency, hematuria and urgency.  Musculoskeletal: Negative for back pain, joint pain, myalgias and neck pain.  Skin: Negative for itching and rash.  Neurological: Positive for dizziness and weakness. Negative for tingling, tremors, sensory change, speech change, focal weakness, seizures, loss of consciousness and headaches.  Psychiatric/Behavioral: Negative for depression and memory loss. The patient is not nervous/anxious and does not have insomnia.    MEDICATIONS AT HOME:   Prior to Admission medications   Medication Sig Start Date End Date Taking? Authorizing Provider  acetaminophen (TYLENOL) 325 MG tablet Take 650 mg by mouth every 6 (six) hours as needed.    [provider]  amiodarone (PACERONE) 200 MG tablet Take 1 tablet (200 mg total) by mouth 3 (three) times daily. Please give 200mg  PO TID for 2 weeks followed by 200mg  PO BID 11/28/18   Gladstone Lighter, MD  apixaban (ELIQUIS) 5 MG TABS tablet Take 1 tablet (5 mg total) by mouth 2 (two) times daily. 11/29/18   [name], MD  aspirin EC 81 MG tablet Take 81 mg by mouth daily.    [provider]  bisacodyl (BISACODYL LAXATIVE) 10 MG suppository Place 10 mg rectally as needed for moderate constipation.    [provider]  ferrous sulfate 325 (65 FE) MG EC tablet Take 1 tablet (325 mg total) by mouth daily with breakfast. 08/30/18   Guse, Jacquelynn Cree, FNP  furosemide (LASIX) 20 MG tablet Take 1 tablet (20 mg total) by mouth daily. 07/10/18   Gladstone Lighter, MD  glucose blood (ONE TOUCH ULTRA TEST) test strip Use as instructed to check Blood sugar three times a day.  E11.65 Dispense  patient choice. 07/30/18   Jodelle Green, FNP  Insulin Glargine (LANTUS SOLOSTAR) 100 UNIT/ML Solostar Pen INJECT  17 UNITS SUBCUTANEOUSLY IN THE MORNING 08/27/18   Guse, Jacquelynn Cree, FNP  Insulin Pen Needle (PEN NEEDLES) 32G X 4 MM MISC Inject 1 Dose into the skin daily. 12/19/16   [name], MD  levothyroxine (SYNTHROID, LEVOTHROID) 75 MCG tablet Take 1 tablet (75 mcg total) by mouth daily. 04/09/18   [name], MD  metFORMIN (GLUCOPHAGE) 1000 MG tablet Take 1 tablet (1,000 mg total) by mouth 2 (two) times daily with a meal. TAKE 1 TABLET TWICE DAILY  WITH  A  MEAL 04/09/18   [name], MD  metoprolol succinate (TOPROL-XL) 25 MG 24 hr tablet Take 1 tablet (25 mg total) by mouth daily. 08/27/18   Jodelle Green, FNP  omeprazole (PRILOSEC) 20 MG capsule Take 1 capsule (20 mg total) by mouth daily. 04/09/18   [name], MD  polyethylene glycol [facility] Floria Raveling) packet Take 17 g by  mouth daily as needed.     [provider]  potassium chloride (K-DUR) 10 MEQ tablet TAKE 1 TABLET BY MOUTH EVERY DAY 09/12/18   [name], MD  simvastatin (ZOCOR) 20 MG tablet Take 1 tablet (20 mg total) by mouth every evening. 11/28/18   Gladstone Lighter, MD      VITAL SIGNS:  Blood pressure (!) 128/44, pulse (!) 55, temperature 97.8 F (36.6 C), resp. rate 18, height 5\' 2"  (1.575 m), weight 89 kg, SpO2 100 %.  PHYSICAL EXAMINATION:  Physical Exam Constitutional:      General: She is not in acute distress.    Appearance: She is not ill-appearing, toxic-appearing or diaphoretic.  HENT:     Head: Atraumatic.     Mouth/Throat:     Pharynx: Oropharynx is clear.  Eyes:     General: No scleral icterus.    Extraocular Movements: Extraocular movements intact.     Conjunctiva/sclera: Conjunctivae normal.  Neck:     Musculoskeletal: Neck supple.  Cardiovascular:     Rate and Rhythm: Normal rate and regular rhythm.     Heart sounds: Normal heart sounds. No murmur.  No friction rub. No gallop.   Pulmonary:     Effort: No respiratory distress.     Breath sounds: Normal breath sounds. No stridor. No wheezing, rhonchi or rales.  Abdominal:     General: Bowel sounds are normal. There is no distension.     Palpations: Abdomen is soft.     Tenderness: There is no abdominal tenderness. There is no guarding or rebound.  Lymphadenopathy:     Cervical: No cervical adenopathy.  Skin:    General: Skin is warm and dry.     Findings: No erythema or rash.  Neurological:     Mental Status: She is alert. Mental status is at baseline.  Psychiatric:        Mood and Affect: Mood normal.        Behavior: Behavior normal.        Thought Content: Thought content normal.        Judgment: Judgment normal.    LABORATORY PANEL:   CBC Recent Labs  Lab 12/11/18 1805  WBC 10.2  HGB 7.8*  HCT 25.5*  PLT 332   ------------------------------------------------------------------------------------------------------------------  Chemistries  Recent Labs  Lab 12/11/18 1805  NA 139  K 4.3  CL 103  CO2 22  GLUCOSE 157*  BUN 21  CREATININE 1.09*  CALCIUM 8.8*  MG 1.6*  AST 20  ALT 11  ALKPHOS 63  BILITOT 0.7   ------------------------------------------------------------------------------------------------------------------  Cardiac Enzymes Recent Labs  Lab 12/11/18 1805  TROPONINI <0.03   ------------------------------------------------------------------------------------------------------------------  RADIOLOGY:  Ct Abdomen Pelvis W Contrast  Result Date: 12/11/2018 CLINICAL DATA:  Constipation and melena. On anticoagulation with symptomatic anemia. EXAM: CT ABDOMEN AND PELVIS WITH CONTRAST TECHNIQUE: Multidetector CT imaging of the abdomen and pelvis was performed using the standard protocol following bolus administration of intravenous contrast. CONTRAST:  180mL OMNIPAQUE IOHEXOL 300 MG/ML  SOLN COMPARISON:  CT 07/05/2018 FINDINGS: Lower chest:  Cardiomegaly. Moderate hiatal hernia with at least 50% of the stomach intrathoracic. No focal airspace disease or pleural fluid. There are coronary artery calcifications. Hepatobiliary: No focal hepatic abnormality. Layering gallstones within physiologically distended gallbladder. No pericholecystic inflammation. No biliary dilatation. Pancreas: No ductal dilatation or inflammation. Spleen: Normal in size without focal abnormality. Adrenals/Urinary Tract: Mild left adrenal thickening without dominant nodule. Normal right adrenal gland. No hydronephrosis or perinephric edema. Homogeneous renal enhancement  with symmetric excretion on delayed phase imaging. Urinary bladder is physiologically distended without wall thickening. Stomach/Bowel: Descending and sigmoid colonic diverticulosis, prominent in the sigmoid colon. No diverticulitis. Moderate volume of stool throughout the colon. No colonic wall thickening or inflammatory change. The appendix is not definitively visualized. Small bowel is unremarkable. No small bowel wall thickening or inflammatory change. Moderate hiatal hernia with approximately 50% of the stomach being intrathoracic. Vascular/Lymphatic: Aortic atherosclerosis without aneurysm. Portal vein is patent. Mesenteric vessels are patent. No adenopathy. Reproductive: Status post hysterectomy. Mixed density left adnexal lesion containing fat, soft tissue density and calcifications measuring 6.4 x 6.0 x 7.0 cm, unchanged allowing for differences in caliper placement. No new adnexal mass. Other: No free air, free fluid, or intra-abdominal fluid collection. Postsurgical change of the anterior abdominal wall. Tiny fat containing umbilical hernia. Musculoskeletal: There are no acute or suspicious osseous abnormalities. Degenerative change throughout spine. Psoas muscles are symmetric without evidence of bleed. IMPRESSION: 1. No acute abnormality in the abdomen/pelvis. 2. Colonic diverticulosis without  diverticulitis. Moderate stool burden throughout the colon suggesting constipation. 3. Moderate hiatal hernia with at least 50% of the stomach intrathoracic. 4. Cholelithiasis without gallbladder inflammation. 5. A 7 cm left ovarian dermoid is unchanged from CT 5 months ago. 6. Coronary artery calcifications. Aortic Atherosclerosis (ICD10-I70.0). Electronically Signed   By: Keith Rake M.D.   On: 12/11/2018 20:27   IMPRESSION AND PLAN:   A/P: 55F constipation, dehydration, lightheadedness/near-syncope, fatigue/malaise/generalized weakness, normocytic anemia. Hyperglycemia (w/ T2IDDM), hypocalcemia, hypomagnesemia. -Constipation: Miralax, PRN bowel regimen. -Dehydration, lightheadedness/near-syncope: IVF. EF 60-65% (w/ grade II diastolic dysfxn) as of 96/75/9163 Echo. Orthostatic VS, FSG qHS/AC, neuro checks q4h x24hr, fall precautions. Trop-I, cardiac monitoring. -Fatigue/malaise/generalized weakness: May be driven by anemia, though other causes are certainly possible. TSH, folate, B12, vit D pending. Fall precautions. Possible P/T candidate. -Normocytic anemia: Hgb 7.8, MCV 90.1. Questionable GI bleed. Protonix. F/up Hgb. Pt in sinus at the time of my assessment; holding Eliquis for now. Resume Eliquis in AM if Hgb stable. Outpt GI if Hgb stable, inpt GI consult otherwise. I believe Dr. Alice Reichert was contacted by the ED provider (Dr. Mariea Clonts). -Hyperglycemia, T2IDDM: SSI. Lantus. -Hypocalcemia: Ionized calcium. -Hypomagnesemia: 1.6. Replete. -c/w other home meds/formulary subs. -FEN/GI: NPO for now. -DVT PPx: SCDs, resume Eliquis in AM if Hgb stable. -Code status: Full code. -Disposition: Admission, > 2 midnights.   All the records are reviewed and case discussed with ED provider. Management plans discussed with the patient, family and they are in agreement.  CODE STATUS: Full code.  TOTAL TIME TAKING CARE OF THIS PATIENT: 75 minutes.    Arta Silence M.D on 12/11/2018 at 11:50 PM   Between 7am to 6pm - Pager - [phone]  After 6pm go to [email or web address] - [employer] [place]  Office  [phone]  CC: Primary care physician; [name], MD   Note: This dictation was prepared with Dragon dictation along with smaller phrase technology. Any transcriptional errors that result from this process are unintentional.

## 2018-12-11 NOTE — Telephone Encounter (Signed)
Sent to PCP to advise results have been faxed. I did call an speak with pt about lab results and sent you a message on the questions you had as well.

## 2018-12-11 NOTE — Telephone Encounter (Signed)
Called and spoke to [place] @ [facility].  Gave verbal orders for OT as requested.

## 2018-12-11 NOTE — Telephone Encounter (Signed)
Copied from [place] [id number]. Topic: Quick Communication - See Telephone Encounter >> Dec 11, 2018  1:59 PM Bea Graff, NT wrote: CRM for notification. See Telephone encounter for: 12/11/18. [place] with [facility] calling to report on this pt. When seeing her today pts words are slurred, dizzy, unsteady and unsafe with walking even with assistance. She is not with the patient at the moment. States that he pt reported yesterday her hemoglobin was 8.0. Please advise. Family being notified by the facility. Requesting labs to be sent to [facility]. Fax#: [phone] CB#: [phone]

## 2018-12-12 ENCOUNTER — Ambulatory Visit: Payer: PPO | Admitting: Physician Assistant

## 2018-12-12 LAB — URINALYSIS, COMPLETE (UACMP) WITH MICROSCOPIC
Bacteria, UA: NONE SEEN
Bilirubin Urine: NEGATIVE
GLUCOSE, UA: NEGATIVE mg/dL
Ketones, ur: NEGATIVE mg/dL
Nitrite: NEGATIVE
PH: 5 (ref 5.0–8.0)
Protein, ur: NEGATIVE mg/dL
Specific Gravity, Urine: >1.046 — ABNORMAL HIGH (ref 1.005–1.030)

## 2018-12-12 LAB — TYPE AND SCREEN
ABO/RH(D): O POS
Antibody Screen: NEGATIVE
Unit division: 0

## 2018-12-12 LAB — GLUCOSE, CAPILLARY
GLUCOSE-CAPILLARY: 138 mg/dL — AB (ref 70–99)
Glucose-Capillary: 55 mg/dL — ABNORMAL LOW (ref 70–99)
Glucose-Capillary: 60 mg/dL — ABNORMAL LOW (ref 70–99)
Glucose-Capillary: 81 mg/dL (ref 70–99)
Glucose-Capillary: 123 mg/dL — ABNORMAL HIGH (ref 70–99)
Glucose-Capillary: 102 mg/dL — ABNORMAL HIGH (ref 70–99)

## 2018-12-12 LAB — BPAM RBC
Blood Product Expiration Date: 202004182359
ISSUE DATE / TIME: 202003252025
Unit Type and Rh: 5100

## 2018-12-12 LAB — HEMOGLOBIN: Hemoglobin: 8.3 g/dL — ABNORMAL LOW (ref 12.0–15.0)

## 2018-12-12 MED ORDER — INSULIN ASPART 100 UNIT/ML ~~LOC~~ SOLN: 0.0000 [IU] | Freq: Every day | SUBCUTANEOUS | Status: DC

## 2018-12-12 MED ORDER — INSULIN ASPART 100 UNIT/ML ~~LOC~~ SOLN
0.0000 [IU] | Freq: Three times a day (TID) | SUBCUTANEOUS | Status: DC
  Administered 2018-12-13 (×2): 1 [IU] via SUBCUTANEOUS
  Administered 2018-12-13: 2 [IU] via SUBCUTANEOUS
  Administered 2018-12-14: 08:00:00 1 [IU] via SUBCUTANEOUS
  Administered 2018-12-14: 17:00:00 3 [IU] via SUBCUTANEOUS
  Administered 2018-12-15: 08:00:00 1 [IU] via SUBCUTANEOUS
  Administered 2018-12-15 (×2): 2 [IU] via SUBCUTANEOUS
  Filled 2018-12-12 (×7): qty 1

## 2018-12-12 MED ORDER — AMIODARONE HCL 200 MG PO TABS
200.0000 mg | ORAL_TABLET | Freq: Every day | ORAL | Status: DC
  Administered 2018-12-13 – 2018-12-16 (×4): 200 mg via ORAL
  Filled 2018-12-12 (×4): qty 1

## 2018-12-12 MED ORDER — MAGNESIUM SULFATE 2 GM/50ML IV SOLN
2.0000 g | Freq: Once | INTRAVENOUS | Status: AC
  Administered 2018-12-12: 2 g via INTRAVENOUS
  Filled 2018-12-12: qty 50

## 2018-12-12 NOTE — Progress Notes (Signed)
Stool specimen sent down for H pylori and Blood occult work.

## 2018-12-12 NOTE — Telephone Encounter (Signed)
Pt did go to ED

## 2018-12-12 NOTE — Progress Notes (Signed)
[place] [facility] [place] NAME: Diane Mullins    MR#:  [id number]  DATE OF BIRTH:  Dec 28, 1932  SUBJECTIVE:  CHIEF COMPLAINT:   Chief Complaint  Patient presents with  . Dizziness  . Abnormal Lab   No dizziness.  Feels weak.  No bowel movements.  REVIEW OF SYSTEMS:    Review of Systems  Constitutional: Positive for malaise/fatigue. Negative for chills and fever.  HENT: Negative for sore throat.   Eyes: Negative for blurred vision, double vision and pain.  Respiratory: Negative for cough, hemoptysis, shortness of breath and wheezing.   Cardiovascular: Negative for chest pain, palpitations, orthopnea and leg swelling.  Gastrointestinal: Negative for abdominal pain, constipation, diarrhea, heartburn, nausea and vomiting.  Genitourinary: Negative for dysuria and hematuria.  Musculoskeletal: Negative for back pain and joint pain.  Skin: Negative for rash.  Neurological: Negative for sensory change, speech change, focal weakness and headaches.  Endo/Heme/Allergies: Does not bruise/bleed easily.  Psychiatric/Behavioral: Negative for depression. The patient is not nervous/anxious.     DRUG ALLERGIES:   Allergies  Allergen Reactions  . Oxycodone     Other reaction(s): Confusion Patient's daughter reported confusion/sensitivity to oxycodone.     VITALS:  Blood pressure (!) 116/55, pulse (!) 55, temperature 98 F (36.7 C), temperature source Oral, resp. rate 18, height 5\' 2"  (1.575 m), weight 89 kg, SpO2 95 %.  PHYSICAL EXAMINATION:   Physical Exam  GENERAL:  85 y.o.-year-old patient lying in the bed with no acute distress.  EYES: Pupils equal, round, reactive to light and accommodation. No scleral icterus. Extraocular muscles intact.  HEENT: Head atraumatic, normocephalic. Oropharynx and nasopharynx clear.  NECK:  Supple, no jugular venous distention. No thyroid enlargement, no tenderness.  LUNGS: Normal breath sounds bilaterally, no  wheezing, rales, rhonchi. No use of accessory muscles of respiration.  CARDIOVASCULAR: S1, S2 normal. No murmurs, rubs, or gallops.  ABDOMEN: Soft, nontender, nondistended. Bowel sounds present. No organomegaly or mass.  EXTREMITIES: No cyanosis, clubbing or edema b/l.    NEUROLOGIC: Cranial nerves II through XII are intact. No focal Motor or sensory deficits b/l.   PSYCHIATRIC: The patient is alert and oriented x 3.  SKIN: No obvious rash, lesion, or ulcer.   LABORATORY PANEL:   CBC Recent Labs  Lab 12/11/18 1805 12/12/18 0323  WBC 10.2  --   HGB 7.8* 8.3*  HCT 25.5*  --   PLT 332  --    ------------------------------------------------------------------------------------------------------------------ Chemistries  Recent Labs  Lab 12/11/18 1805  NA 139  K 4.3  CL 103  CO2 22  GLUCOSE 157*  BUN 21  CREATININE 1.09*  CALCIUM 8.8*  MG 1.6*  AST 20  ALT 11  ALKPHOS 63  BILITOT 0.7   ------------------------------------------------------------------------------------------------------------------  Cardiac Enzymes Recent Labs  Lab 12/11/18 1805  TROPONINI <0.03   ------------------------------------------------------------------------------------------------------------------  RADIOLOGY:  Ct Abdomen Pelvis W Contrast  Result Date: 12/11/2018 CLINICAL DATA:  Constipation and melena. On anticoagulation with symptomatic anemia. EXAM: CT ABDOMEN AND PELVIS WITH CONTRAST TECHNIQUE: Multidetector CT imaging of the abdomen and pelvis was performed using the standard protocol following bolus administration of intravenous contrast. CONTRAST:  176mL OMNIPAQUE IOHEXOL 300 MG/ML  SOLN COMPARISON:  CT 07/05/2018 FINDINGS: Lower chest: Cardiomegaly. Moderate hiatal hernia with at least 50% of the stomach intrathoracic. No focal airspace disease or pleural fluid. There are coronary artery calcifications. Hepatobiliary: No focal hepatic abnormality. Layering gallstones within  physiologically distended gallbladder. No pericholecystic inflammation. No biliary dilatation. Pancreas:  No ductal dilatation or inflammation. Spleen: Normal in size without focal abnormality. Adrenals/Urinary Tract: Mild left adrenal thickening without dominant nodule. Normal right adrenal gland. No hydronephrosis or perinephric edema. Homogeneous renal enhancement with symmetric excretion on delayed phase imaging. Urinary bladder is physiologically distended without wall thickening. Stomach/Bowel: Descending and sigmoid colonic diverticulosis, prominent in the sigmoid colon. No diverticulitis. Moderate volume of stool throughout the colon. No colonic wall thickening or inflammatory change. The appendix is not definitively visualized. Small bowel is unremarkable. No small bowel wall thickening or inflammatory change. Moderate hiatal hernia with approximately 50% of the stomach being intrathoracic. Vascular/Lymphatic: Aortic atherosclerosis without aneurysm. Portal vein is patent. Mesenteric vessels are patent. No adenopathy. Reproductive: Status post hysterectomy. Mixed density left adnexal lesion containing fat, soft tissue density and calcifications measuring 6.4 x 6.0 x 7.0 cm, unchanged allowing for differences in caliper placement. No new adnexal mass. Other: No free air, free fluid, or intra-abdominal fluid collection. Postsurgical change of the anterior abdominal wall. Tiny fat containing umbilical hernia. Musculoskeletal: There are no acute or suspicious osseous abnormalities. Degenerative change throughout spine. Psoas muscles are symmetric without evidence of bleed. IMPRESSION: 1. No acute abnormality in the abdomen/pelvis. 2. Colonic diverticulosis without diverticulitis. Moderate stool burden throughout the colon suggesting constipation. 3. Moderate hiatal hernia with at least 50% of the stomach intrathoracic. 4. Cholelithiasis without gallbladder inflammation. 5. A 7 cm left ovarian dermoid is  unchanged from CT 5 months ago. 6. Coronary artery calcifications. Aortic Atherosclerosis (ICD10-I70.0). Electronically Signed   By: Keith Rake M.D.   On: 12/11/2018 20:27     ASSESSMENT AND PLAN:   *Acute on chronic anemia.  Concern regarding melena at home.  Check stool for occult blood. Transfuse to keep hemoglobin greater than 7. Hold Eliquis and aspirin at this time. Consulted and discussed with Dr. Alice Reichert of GI. On IV Protonix Wait for GI input  *Atrial fibrillation with bradycardia and pauses.  Suspect this is likely because of patient's syncope. On high-dose amiodarone of 600 mg daily.  Will reduce to 200 mg daily from tomorrow  *Gait abnormalities.  Chronic.  Worsened by her anemia.  Consult physical therapy.  *Chronic diastolic congestive heart failure is stable.  Euvolemic.  *Diabetes mellitus.  Sliding scale insulin.  All the records are reviewed and case discussed with Care Management/Social Worker Management plans discussed with the patient, family and they are in agreement.  CODE STATUS: FULL CODE  DVT Prophylaxis: SCDs  TOTAL TIME TAKING CARE OF THIS PATIENT: 35 minutes.   POSSIBLE D/C IN 1-2 DAYS, DEPENDING ON CLINICAL CONDITION.  Diane Mullins M.D on 12/12/2018 at 2:14 PM  Between 7am to 6pm - Pager - [phone]  After 6pm go to [email or web address] - password [email or web address] [place] [facility]  Office  [phone]  CC: Primary care physician; [name], MD  Note: This dictation was prepared with Dragon dictation along with smaller phrase technology. Any transcriptional errors that result from this process are unintentional.

## 2018-12-12 NOTE — Progress Notes (Signed)
MD aware of 3 episodes of sinus pause .

## 2018-12-12 NOTE — Progress Notes (Signed)
Initial Nutrition Assessment  RD working remotely.  DOCUMENTATION CODES:   Obesity unspecified  INTERVENTION:  Once diet advances provide Ensure Max Protein po BID, each supplement provides 150 kcal and 30 grams of protein.  NUTRITION DIAGNOSIS:   Inadequate oral intake related to decreased appetite, early satiety as evidenced by per patient/family report.  GOAL:   Patient will meet greater than or equal to 90% of their needs  MONITOR:   PO intake, Supplement acceptance, Diet advancement, Labs, Weight trends, I & O's, Skin  REASON FOR ASSESSMENT:   Malnutrition Screening Tool    ASSESSMENT:   85 year old female with PMHx of HLD, HTN, DM, chronic diastolic CHF, A-fib admitted with constipation, dehydration, generalized weakness.   Called patient over the phone to obtain nutrition/weight history. She reports she has had a decreased appetite for about 2 weeks since she was last discharged. She was still trying to eat at meals, but was eating less than usual and experiencing early satiety. She is amenable to drinking an ONS once diet advances. She has tried Glucerna before and did not like it. Patient is unsure about weight history. Per chart she was around 92 kg and is now down to 89 kg if current weight is accurate. She has lost 3 kg (3.3% body weight) over the past 2 weeks, which is likely partially related to dehydration.  Medications reviewed and include: amiodarone, Novolog 0-9 units TID, Lantus 10 units daily, levothyroxine, pantoprazole, Miralax.  Labs reviewed: CBG 55-103.  NUTRITION - FOCUSED PHYSICAL EXAM:  Unable to complete NFPE at this time.  Diet Order:   Diet Order            Diet clear liquid Room service appropriate? Yes; Fluid consistency: Thin  Diet effective now             EDUCATION NEEDS:   No education needs have been identified at this time  Skin:  Skin Assessment: Skin Integrity Issues:(MSAD to groin and breast)  Last BM:  PTA  Height:    Ht Readings from Last 1 Encounters:  12/11/18 5\' 2"  (1.575 m)   Weight:   Wt Readings from Last 1 Encounters:  12/11/18 89 kg   Ideal Body Weight:  50 kg  BMI:  Body mass index is 35.87 kg/m.  Estimated Nutritional Needs:   Kcal:  1500-1700  Protein:  75-90 grams  Fluid:  1.5-1.7 L/day  Willey Blade, MS, RD, LDN Office: [phone] Pager: [phone] After Hours/Weekend Pager: [phone]

## 2018-12-12 NOTE — Plan of Care (Signed)
  Problem: Education: Goal: Ability to identify signs and symptoms of gastrointestinal bleeding will improve Outcome: Progressing   Problem: Bowel/Gastric: Goal: Will show no signs and symptoms of gastrointestinal bleeding Outcome: Progressing   Problem: Fluid Volume: Goal: Will show no signs and symptoms of excessive bleeding Outcome: Progressing   Problem: Clinical Measurements: Goal: Complications related to the disease process, condition or treatment will be avoided or minimized Outcome: Progressing   Problem: Health Behavior/Discharge Planning: Goal: Ability to manage health-related needs will improve Outcome: Progressing   Problem: Clinical Measurements: Goal: Ability to maintain clinical measurements within normal limits will improve Outcome: Progressing Goal: Will remain free from infection Outcome: Progressing Goal: Respiratory complications will improve Outcome: Progressing   Problem: Activity: Goal: Risk for activity intolerance will decrease Outcome: Progressing   Problem: Nutrition: Goal: Adequate nutrition will be maintained Outcome: Progressing   Problem: Coping: Goal: Level of anxiety will decrease Outcome: Progressing   Problem: Elimination: Goal: Will not experience complications related to bowel motility Outcome: Progressing   Problem: Pain Managment: Goal: General experience of comfort will improve Outcome: Progressing   Problem: Safety: Goal: Ability to remain free from injury will improve Outcome: Progressing   Problem: Skin Integrity: Goal: Risk for impaired skin integrity will decrease Outcome: Progressing

## 2018-12-12 NOTE — Consult Note (Signed)
[name] , MD [address] [place], [place], [place], [address] [address], [place], [place], [place], [address] Phone: [phone]  Fax: [phone]  Consultation  Referring Provider: Dr Aliene Altes  Primary Care Physician:  [name], MD Primary Gastroenterologist:  None          Reason for Consultation:     Anemia   Date of Admission:  12/11/2018 Date of Consultation:  12/12/2018         HPI:   Diane Mullins is a 85 y.o. female presented to the hospital yesterday with dizziness and abnormal labs when checked by her PCP.  2 weeks back she was admitted to the hospital with atrial fibrillation.  Has been on Eliquis.  She has recently suffered from constipation.  At home she had an episode of possible syncope.  On admission she had a normal BUN of 21 and creatinine of 1.09.  Low magnesium of 1.6.  2 weeks back hemoglobin was 10.6 g on admission was 7.8 g and this morning is 8.3 g.  INR 1.3.  She underwent a CT scan of the abdomen with contrast in the ER.  Moderate volume stool is seen throughout the colon.  There was a moderate-sized hiatal hernia with approximately 50% of the stomach being intrathoracic.  She denies any overt blood loss,abdominal pain , nasal bleeds, no bowel movement today . She says she last took her Eloquis yesterday morning ie Wednesday . Not had any dizziness so far .   Past Medical History:  Diagnosis Date  . Atrial fibrillation ([place])   . Benign paroxysmal positional vertigo 07/17/2014  . Chronic diastolic CHF (congestive heart failure) ([place])    a. echo 10/19: EF of 60-65%, no RWMA, Gr2DD, calcified mitral annulus with mild MR, mildly to moderately dilated left atrium, moderate TR, PASP 52 mmHg  . Diabetes mellitus   . H/O: rheumatic fever   . Hyperlipidemia   . Hypertension   . Pulmonary hypertension ([place])   . Stroke [facility])    TIA Jan. 1st  . Teratoma of left ovary   . Thyroid disease     Past Surgical History:  Procedure Laterality Date   . ABDOMINAL HYSTERECTOMY  1973   menorrhagia  . CARDIOVERSION N/A 10/22/2018   Procedure: CARDIOVERSION;  Surgeon: Nelva Bush, MD;  Location: [facility] ORS;  Service: Cardiovascular;  Laterality: N/A;  . CARDIOVERSION N/A 11/26/2018   Procedure: CARDIOVERSION (CATH LAB);  Surgeon: Minna Merritts, MD;  Location: [facility] ORS;  Service: Cardiovascular;  Laterality: N/A;  . [place]  . ORIF ANKLE FRACTURE Right 10/29/2015   Procedure: OPEN REDUCTION INTERNAL FIXATION (ORIF) ANKLE FRACTURE;  Surgeon: Thornton Park, MD;  Location: [facility] ORS;  Service: Orthopedics;  Laterality: Right;  . TEE WITHOUT CARDIOVERSION N/A 10/22/2018   Procedure: TRANSESOPHAGEAL ECHOCARDIOGRAM (TEE);  Surgeon: Nelva Bush, MD;  Location: [facility] ORS;  Service: Cardiovascular;  Laterality: N/A;  . VAGINAL DELIVERY     x4    Prior to Admission medications   Medication Sig Start Date End Date Taking? Authorizing Provider  acetaminophen (TYLENOL) 325 MG tablet Take 650 mg by mouth every 6 (six) hours as needed.    [provider]  amiodarone (PACERONE) 200 MG tablet Take 1 tablet (200 mg total) by mouth 3 (three) times daily. Please give 200mg  PO TID for 2 weeks followed by 200mg  PO BID 11/28/18   Gladstone Lighter, MD  apixaban (ELIQUIS) 5 MG TABS tablet Take 1 tablet (5  mg total) by mouth 2 (two) times daily. 11/29/18   [name], MD  aspirin EC 81 MG tablet Take 81 mg by mouth daily.    [provider]  bisacodyl (BISACODYL LAXATIVE) 10 MG suppository Place 10 mg rectally as needed for moderate constipation.    [provider]  ferrous sulfate 325 (65 FE) MG EC tablet Take 1 tablet (325 mg total) by mouth daily with breakfast. 08/30/18   Guse, Jacquelynn Cree, FNP  furosemide (LASIX) 20 MG tablet Take 1 tablet (20 mg total) by mouth daily. 07/10/18   Gladstone Lighter, MD  glucose blood (ONE TOUCH ULTRA TEST) test strip Use as instructed to check Blood sugar three times a day.   E11.65 Dispense patient choice. 07/30/18   Jodelle Green, FNP  Insulin Glargine (LANTUS SOLOSTAR) 100 UNIT/ML Solostar Pen INJECT  17 UNITS SUBCUTANEOUSLY IN THE MORNING 08/27/18   Guse, Jacquelynn Cree, FNP  Insulin Pen Needle (PEN NEEDLES) 32G X 4 MM MISC Inject 1 Dose into the skin daily. 12/19/16   [name], MD  levothyroxine (SYNTHROID, LEVOTHROID) 75 MCG tablet Take 1 tablet (75 mcg total) by mouth daily. 04/09/18   [name], MD  metFORMIN (GLUCOPHAGE) 1000 MG tablet Take 1 tablet (1,000 mg total) by mouth 2 (two) times daily with a meal. TAKE 1 TABLET TWICE DAILY  WITH  A  MEAL 04/09/18   [name], MD  metoprolol succinate (TOPROL-XL) 25 MG 24 hr tablet Take 1 tablet (25 mg total) by mouth daily. 08/27/18   Jodelle Green, FNP  omeprazole (PRILOSEC) 20 MG capsule Take 1 capsule (20 mg total) by mouth daily. 04/09/18   [name], MD  polyethylene glycol [facility] / Floria Raveling) packet Take 17 g by mouth daily as needed.     [provider]  potassium chloride (K-DUR) 10 MEQ tablet TAKE 1 TABLET BY MOUTH EVERY DAY 09/12/18   [name], MD  simvastatin (ZOCOR) 20 MG tablet Take 1 tablet (20 mg total) by mouth every evening. 11/28/18   Gladstone Lighter, MD    Family History  Problem Relation Age of Onset  . Heart disease Mother 1  . Heart attack Mother 49  . Cancer Father        colon  . Diabetes Sister   . Hypertension Sister   . Cancer Brother        kidney  . Diabetes Brother   . Cancer Daughter        ovarian  . Non-Hodgkin's lymphoma Son      Social History   Tobacco Use  . Smoking status: Never Smoker  . Smokeless tobacco: Never Used  Substance Use Topics  . Alcohol use: No  . Drug use: No    Allergies as of 12/11/2018 - Review Complete 12/11/2018  Allergen Reaction Noted  . Oxycodone  10/17/2016    Review of Systems:    All systems reviewed and negative except where noted in HPI.   Physical Exam:  Vital signs in last  24 hours: Temp:  [97.8 F (36.6 C)-98 F (36.7 C)] 98 F (36.7 C) (03/26 0450) Pulse Rate:  [55-73] 55 (03/26 0450) Resp:  [16-20] 18 (03/26 0450) BP: (116-157)/(44-64) 116/55 (03/26 0450) SpO2:  [95 %-100 %] 95 % (03/26 0450) Weight:  [88 kg-89 kg] 89 kg (03/25 2234)   General:   Pleasant, cooperative in NAD Head:  Normocephalic and atraumatic. Eyes:   No icterus.   Conjunctiva pink.  PERRLA. Ears:  Normal auditory acuity. Neck:  Supple; no masses or thyroidomegaly Lungs: Respirations even and unlabored. Lungs clear to auscultation bilaterally.   No wheezes, crackles, or rhonchi.  Heart:  Regular rate and rhythm;  Without murmur, clicks, rubs or gallops Abdomen:  Soft, nondistended, nontender. Normal bowel sounds. No appreciable masses or hepatomegaly.  No rebound or guarding.  Neurologic:  Alert and oriented x3;  grossly normal neurologically. Skin:  Intact without significant lesions or rashes. Cervical Nodes:  No significant cervical adenopathy. Psych:  Alert and cooperative. Normal affect.  LAB RESULTS: Recent Labs    12/10/18 1425 12/11/18 1805 12/12/18 0323  WBC 9.2 10.2  --   HGB 8.2 Repeated and verified X2.* 7.8* 8.3*  HCT 25.3* 25.5*  --   PLT 295.0 332  --    BMET Recent Labs    12/10/18 1425 12/11/18 1805  NA 140 139  K 4.4 4.3  CL 101 103  CO2 23 22  GLUCOSE 179* 157*  BUN 24* 21  CREATININE 1.10 1.09*  CALCIUM 8.9 8.8*   LFT Recent Labs    12/11/18 1805  PROT 6.7  ALBUMIN 3.7  AST 20  ALT 11  ALKPHOS 63  BILITOT 0.7  BILIDIR <0.1  IBILI NOT CALCULATED   PT/INR Recent Labs    12/11/18 1858  LABPROT 16.0*  INR 1.3*    STUDIES: Ct Abdomen Pelvis W Contrast  Result Date: 12/11/2018 CLINICAL DATA:  Constipation and melena. On anticoagulation with symptomatic anemia. EXAM: CT ABDOMEN AND PELVIS WITH CONTRAST TECHNIQUE: Multidetector CT imaging of the abdomen and pelvis was performed using the standard protocol following bolus  administration of intravenous contrast. CONTRAST:  169mL OMNIPAQUE IOHEXOL 300 MG/ML  SOLN COMPARISON:  CT 07/05/2018 FINDINGS: Lower chest: Cardiomegaly. Moderate hiatal hernia with at least 50% of the stomach intrathoracic. No focal airspace disease or pleural fluid. There are coronary artery calcifications. Hepatobiliary: No focal hepatic abnormality. Layering gallstones within physiologically distended gallbladder. No pericholecystic inflammation. No biliary dilatation. Pancreas: No ductal dilatation or inflammation. Spleen: Normal in size without focal abnormality. Adrenals/Urinary Tract: Mild left adrenal thickening without dominant nodule. Normal right adrenal gland. No hydronephrosis or perinephric edema. Homogeneous renal enhancement with symmetric excretion on delayed phase imaging. Urinary bladder is physiologically distended without wall thickening. Stomach/Bowel: Descending and sigmoid colonic diverticulosis, prominent in the sigmoid colon. No diverticulitis. Moderate volume of stool throughout the colon. No colonic wall thickening or inflammatory change. The appendix is not definitively visualized. Small bowel is unremarkable. No small bowel wall thickening or inflammatory change. Moderate hiatal hernia with approximately 50% of the stomach being intrathoracic. Vascular/Lymphatic: Aortic atherosclerosis without aneurysm. Portal vein is patent. Mesenteric vessels are patent. No adenopathy. Reproductive: Status post hysterectomy. Mixed density left adnexal lesion containing fat, soft tissue density and calcifications measuring 6.4 x 6.0 x 7.0 cm, unchanged allowing for differences in caliper placement. No new adnexal mass. Other: No free air, free fluid, or intra-abdominal fluid collection. Postsurgical change of the anterior abdominal wall. Tiny fat containing umbilical hernia. Musculoskeletal: There are no acute or suspicious osseous abnormalities. Degenerative change throughout spine. Psoas muscles  are symmetric without evidence of bleed. IMPRESSION: 1. No acute abnormality in the abdomen/pelvis. 2. Colonic diverticulosis without diverticulitis. Moderate stool burden throughout the colon suggesting constipation. 3. Moderate hiatal hernia with at least 50% of the stomach intrathoracic. 4. Cholelithiasis without gallbladder inflammation. 5. A 7 cm left ovarian dermoid is unchanged from CT 5 months ago. 6. Coronary artery calcifications. Aortic Atherosclerosis (  ICD10-I70.0). Electronically Signed   By: Keith Rake M.D.   On: 12/11/2018 20:27      Impression / Plan:   MARTRICE APT is a 85 y.o. y/o female with a history of atrial fibrillation on Eliquis presents to the emergency room with a possible syncopal episode as well as found to have new onset anemia.  Over 2 g drop in hemoglobin since 2 weeks.  No overt signs of GI bleeding rather she has had constipation.  With a large intrathoracic hiatal hernia seen on the CAT scan with more than 50% of the stomach in the thorax.  Normal BUN/creatinine ratio.  Last colonoscopy has been 11 years back.  Plan 1.  Replete low electrolytes and would need cardiac evaluation for possible syncopal/near syncopal episode.  Once cleared from the cardiac point of view and when she is off 2 days of Eliquis which would be Saturday , she will require an upper endoscopy to rule out any Lysbeth Galas lesions/AVM that could be bleeding.  2.  There is no role in checking stool occult blood test which is a test for colon cancer screening.  A positive or negative test will not rule in or rule out the cause of the bleeding which still needs to be evaluated.  3.  Since she is on anticoagulation she will require long-term oral PPI as an outpatient but while she is an inpatient she can receive twice daily IV PPI.  Suggest to check stool H. pylori antigen.  4.  Check CBC and monitor transfuse as needed.  Thank you for involving me in the care of this patient.      LOS: 1 day    [name], MD  12/12/2018, 9:36 AM

## 2018-12-12 NOTE — TOC Initial Note (Signed)
Transition of Care [facility]) - Initial/Assessment Note    Patient Details  Name: Diane Mullins MRN: [id number] Date of Birth: [birth year]  Transition of Care [facility]) CM/SW Contact:    Shelbie Ammons, RN Phone Number: 12/12/2018, 11:17 AM  Clinical Narrative:      Admitted to [facility] with the diagnosis of melena. Lives at [facility] independent Living x 3 years. Daughter is [name] [phone]). Last seen Dr. Biagio Quint 12/10/18. [facility] [address]. Discharged from this facility 3/12?20.  Followed by [facility].  [place] x 3 and [employer] [employer] x 1 for rehabilitation. Takes care of all basic activities of daily living herself, doesn't drive.             Expected Discharge Plan: [employer] Barriers to Discharge: No Barriers Identified   Patient Goals and CMS Choice Patient states their goals for this hospitalization and ongoing recovery are:: (I would like to return to [facility]) [place] Medicare.gov Compare Post Acute Care list provided to:: Patient Choice offered to / list presented to : (Already with [place])  Expected Discharge Plan and Services Expected Discharge Plan: [place] In-house Referral: NA Discharge Planning Services: CM Consult Post Acute Care Choice: NA Living arrangements for the past 2 months: Apartment Expected Discharge Date: 12/13/18               DME Arranged: N/A DME Agency: NA HH Arranged: PT [place] Agency: [facility] (formerly [employer])  Prior Living Arrangements/Services Living arrangements for the past 2 months: [place] with:: Self Patient language and need for interpreter reviewed:: No Do you feel safe going back to the place where you live?: Yes      Need for Family Participation in Patient Care: Yes (Comment) Care giver support system in place?: Yes (comment)(Good family support) Current home services: Home PT Criminal Activity/Legal  Involvement Pertinent to Current Situation/Hospitalization: No - Comment as needed  Activities of Daily Living Home Assistive Devices/Equipment: Environmental consultant (specify type), Eyeglasses, Grab bars around toilet, Shower chair with back ADL Screening (condition at time of admission) Patient's cognitive ability adequate to safely complete daily activities?: Yes Is the patient deaf or have difficulty hearing?: No Does the patient have difficulty seeing, even when wearing glasses/contacts?: No Does the patient have difficulty concentrating, remembering, or making decisions?: No Patient able to express need for assistance with ADLs?: Yes Does the patient have difficulty dressing or bathing?: No Independently performs ADLs?: No Communication: Independent Dressing (OT): Independent Grooming: Independent Feeding: Independent Bathing: Needs assistance Is this a change from baseline?: Change from baseline, expected to last <3 days Toileting: Needs assistance Is this a change from baseline?: Change from baseline, expected to last <3 days In/Out Bed: Needs assistance Is this a change from baseline?: Change from baseline, expected to last <3 days Walks in Home: Needs assistance Is this a change from baseline?: Change from baseline, expected to last <3 days Does the patient have difficulty walking or climbing stairs?: Yes Weakness of Legs: Both Weakness of Arms/Hands: None  Permission Sought/Granted Permission sought to share information with : Case Manager Permission granted to share information with : Yes, Verbal Permission Granted              Emotional Assessment   Attitude/Demeanor/Rapport: Self-Confident Affect (typically observed): Accepting Orientation: : Oriented to Self, Oriented to Place, Oriented to  Time, Oriented to Situation Alcohol / Substance Use: Not Applicable Psych  Involvement: No (comment)  Admission diagnosis:  Gastrointestinal hemorrhage with melena [K92.1] Symptomatic  anemia [D64.9] Constipation, unspecified constipation type [K59.00] Melena [K92.1] Patient Active Problem List   Diagnosis Date Noted  . Melena 12/11/2018  . Constipation 12/10/2018  . Atrial flutter ([place]) 10/17/2018  . 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 AKI (acute kidney injury) ([place])   . Acute on chronic diastolic heart failure ([place])   . Respiratory failure ([place]) 07/05/2018  . Coronary artery calcification seen on CAT scan 11/16/2017  . Morbid obesity ([place]) 10/30/2017  . (HFpEF) heart failure with preserved ejection fraction ([place]) 09/28/2017  . Family history of colon cancer 04/17/2017  . Closed right ankle fracture 10/28/2015  . Fatigue 10/18/2015  . TIA (transient ischemic attack) 09/18/2015  . Orthostatic hypotension 08/02/2015  . Adjustment disorder with anxious mood 03/30/2015  . Postmenopausal estrogen deficiency 06/03/2014  . DNR (do not resuscitate) discussion 03/04/2014  . Gait disturbance 05/07/2013  . Diabetes ([place]) 04/21/2013  . Shortness of breath 12/09/2012  . Hearing loss 12/09/2012  . Anxiety 01/31/2012  . Hypertension 12/22/2011  . Hyperlipidemia 12/22/2011  . Hypothyroidism 12/22/2011   PCP:  [name], MD Pharmacy[facility]   [id number] [place], [place] [address] [place] [address] Phone: [phone] Fax: [phone]  [facility]) - [place], [place] [address] Phone: [phone] Fax: [phone]     Social Determinants of Health (SDOH) Interventions [place] x 3 years. Walks to dining hall for dinner usually  States that [employer] is closed. All meals are delivered to the apartments    Readmission Risk Interventions Readmission Risk Prevention Plan 11/26/2018  Transportation Screening Complete  PCP or Specialist Appt within 5-7 Days Complete  Home Care Screening Complete  Medication Review (RN CM)  Complete  Some recent data might be hidden

## 2018-12-12 NOTE — Progress Notes (Signed)
Inpatient Diabetes Program Recommendations  AACE/ADA: New Consensus Statement on Inpatient Glycemic Control   Target Ranges:  Prepandial:   less than 140 mg/dL      Peak postprandial:   less than 180 mg/dL (1-2 hours)      Critically ill patients:  140 - 180 mg/dL   Results for JATOYA, ARMBRISTER (MRN [id number]) as of 12/12/2018 09:44  Ref. Range 12/11/2018 22:37 12/12/2018 07:53 12/12/2018 07:55 12/12/2018 08:17  Glucose-Capillary Latest Ref Range: 70 - 99 mg/dL 103 (H) 55 (L) 60 (L) 81   Review of Glycemic Control  Diabetes history: DM2 Outpatient Diabetes medications: Lantus 17 units QAM, Metformin 1000 mg BID Current orders for Inpatient glycemic control: Lantus 10 units daily, Novolog 0-9 units TID with meals  Inpatient Diabetes Program Recommendations:   Insulin - Basal: Please discontinue Lantus for now.   NOTE: Noted fasting glucose 55 mg/dl today. No Lantus given since admitted.   Thanks, Barnie Alderman, RN, MSN, CDE Diabetes Coordinator Inpatient Diabetes Program [phone] (Team Pager from 8am to 5pm)

## 2018-12-13 LAB — CBC WITH DIFFERENTIAL/PLATELET
Abs Immature Granulocytes: 0.03 10*3/uL (ref 0.00–0.07)
Basophils Absolute: 0.1 10*3/uL (ref 0.0–0.1)
Basophils Relative: 1 %
EOS ABS: 0.1 10*3/uL (ref 0.0–0.5)
Eosinophils Relative: 2 %
HEMATOCRIT: 28.4 % — AB (ref 36.0–46.0)
Hemoglobin: 9 g/dL — ABNORMAL LOW (ref 12.0–15.0)
Immature Granulocytes: 0 %
LYMPHS ABS: 2.1 10*3/uL (ref 0.7–4.0)
Lymphocytes Relative: 28 %
MCH: 28.8 pg (ref 26.0–34.0)
MCHC: 31.7 g/dL (ref 30.0–36.0)
MCV: 90.7 fL (ref 80.0–100.0)
MONOS PCT: 9 %
Monocytes Absolute: 0.7 10*3/uL (ref 0.1–1.0)
Neutro Abs: 4.5 10*3/uL (ref 1.7–7.7)
Neutrophils Relative %: 60 %
Platelets: 314 10*3/uL (ref 150–400)
RBC: 3.13 MIL/uL — ABNORMAL LOW (ref 3.87–5.11)
RDW: 16.3 % — ABNORMAL HIGH (ref 11.5–15.5)
WBC: 7.5 10*3/uL (ref 4.0–10.5)
nRBC: 0 % (ref 0.0–0.2)

## 2018-12-13 LAB — BASIC METABOLIC PANEL
Anion gap: 11 (ref 5–15)
BUN: 10 mg/dL (ref 8–23)
CO2: 25 mmol/L (ref 22–32)
Calcium: 8.8 mg/dL — ABNORMAL LOW (ref 8.9–10.3)
Chloride: 103 mmol/L (ref 98–111)
Creatinine, Ser: 1.04 mg/dL — ABNORMAL HIGH (ref 0.44–1.00)
GFR calc Af Amer: 57 mL/min — ABNORMAL LOW (ref >60)
GFR calc non Af Amer: 49 mL/min — ABNORMAL LOW (ref >60)
Glucose, Bld: 209 mg/dL — ABNORMAL HIGH (ref 70–99)
Potassium: 4 mmol/L (ref 3.5–5.1)
Sodium: 139 mmol/L (ref 135–145)

## 2018-12-13 LAB — GLUCOSE, CAPILLARY
GLUCOSE-CAPILLARY: 127 mg/dL — AB (ref 70–99)
Glucose-Capillary: 150 mg/dL — ABNORMAL HIGH (ref 70–99)
Glucose-Capillary: 171 mg/dL — ABNORMAL HIGH (ref 70–99)
Glucose-Capillary: 125 mg/dL — ABNORMAL HIGH (ref 70–99)

## 2018-12-13 LAB — MAGNESIUM: Magnesium: 1.9 mg/dL (ref 1.7–2.4)

## 2018-12-13 LAB — CALCIUM, IONIZED: Calcium, Ionized, Serum: 4.9 mg/dL (ref 4.5–5.6)

## 2018-12-13 MED ORDER — PANTOPRAZOLE SODIUM 40 MG IV SOLR
40.0000 mg | Freq: Two times a day (BID) | INTRAVENOUS | Status: DC
  Administered 2018-12-13 – 2018-12-16 (×6): 40 mg via INTRAVENOUS
  Filled 2018-12-13 (×7): qty 40

## 2018-12-13 NOTE — Progress Notes (Signed)
Notified Dr Darvin Neighbours of pt's HR down to 30's earlier today not sustaining.  No new orders at this time. Will continue to monitor.

## 2018-12-13 NOTE — Evaluation (Signed)
Physical Therapy Evaluation Patient Details Name: Diane Mullins MRN: [id number] DOB: 1933-08-19 Today's Date: 12/13/2018   History of Present Illness  85 y/o female admitted to hospital after syncopal episode. PMH significant for atrial flutter, CHF, anemia, constipation, and lightheadedness.   Clinical Impression  Patient very willing to participate in therapy. Patient demonstrated mild impulsivity with transfers and ambulation, but responds well to VCs. Patient able to sit<>stand with CGA and use of her arms; upon standing, patient requires BUE support to maintain balance. Patient able to ambulate short distances (x35 feet, x45 feet) requires RW, CGA and mod-max VCs for appropriate DME use and safety. While HR and SpO2 were stable during ambulation, patient fatigues quickly and requires seated rest to recover. Patient is not consistently communicative of her level of fatigue which presents an increased safety risk. Patient will benefit from skilled PT services to address deficits in BLE strength, gait mechanics, balance, and safety awareness for reduced risk of falls.    Follow Up Recommendations SNF    Equipment Recommendations  Other (comment)(TBD at next venue of care)    Recommendations for Other Services       Precautions / Restrictions Precautions Precautions: Fall Restrictions Weight Bearing Restrictions: No      Mobility  Bed Mobility               General bed mobility comments: Patient sitting up in chair on arrival.  Transfers Overall transfer level: Modified independent Equipment used: Rolling walker (2 wheeled)             General transfer comment: Patient able to perform sit<>stand with AD; benefits from min guard and demonstrated soem decreased safety awareness/impulsivity.  Ambulation/Gait Ambulation/Gait assistance: Min assist Gait Distance (Feet): 45 Feet Assistive device: Rolling walker (2 wheeled) Gait Pattern/deviations:  Trendelenburg;Staggering left;Shuffle     General Gait Details: Patient ambulated for 2 bouts (x35 feet, x45 feet) with sitting rest between (chair follow). HR and SpO2 were WNLs (70-76 bpm, 95-98%). Patient makes inital contact on LLE with supinated foot and decreased pelvic control in stance. Patient has inconsistent clearance on RLE during swing phase. Requires mod-max VCs for safety with RW and gait speed. Patient demonstrates increased impulsivity when faitgued. No LOB; patient did attempt rapid descent to sitting after first bout of walking. Patient shows and voices fatigue after short distances with inability to continue gait without increased unsteadiness, poor quality of movement, and increased reliance on external support/PT assistance. Patient is not presently safe to ambulate without min assist due to risk of fall and decreased safety awareness.  Stairs            Wheelchair Mobility    Modified Rankin (Stroke Patients Only)       Balance Overall balance assessment: Needs assistance         Standing balance support: Bilateral upper extremity supported Standing balance-Leahy Scale: Poor                               Pertinent Vitals/Pain Pain Assessment: No/denies pain    Home Living Family/patient expects to be discharged to:: Skilled nursing facility   Available Help at Discharge: Family Type of Home: Independent living facility Home Access: Level entry       Home Equipment: Walker - 4 wheels Additional Comments: Single level, no steps required.  Walk in shower with seat; grab bars throughout apartment.    Prior Function Level of Independence: Independent  with assistive device(s)         Comments: Pt walks down to meals 2x/day, tries to stay somewhat active     Hand Dominance   Dominant Hand: Right    Extremity/Trunk Assessment   Upper Extremity Assessment Upper Extremity Assessment: Generalized weakness    Lower Extremity  Assessment Lower Extremity Assessment: Generalized weakness       Communication   Communication: HOH  Cognition Arousal/Alertness: Awake/alert Behavior During Therapy: WFL for tasks assessed/performed Overall Cognitive Status: Within Functional Limits for tasks assessed                                        General Comments      Exercises     Assessment/Plan    PT Assessment Patient needs continued PT services  PT Problem List Decreased strength;Decreased range of motion;Decreased activity tolerance;Decreased balance;Decreased mobility;Decreased knowledge of use of DME;Decreased safety awareness;Cardiopulmonary status limiting activity       PT Treatment Interventions DME instruction;Gait training;Functional mobility training;Therapeutic activities;Therapeutic exercise;Balance training;Neuromuscular re-education;Patient/family education    PT Goals (Current goals can be found in the Care Plan section)  Acute Rehab PT Goals Patient Stated Goal: return to ILF [facility]) PT Goal Formulation: With patient Time For Goal Achievement: 12/27/18 Potential to Achieve Goals: Fair    Frequency Min 2X/week   Barriers to discharge        Co-evaluation               AM-PAC PT "6 Clicks" Mobility  Outcome Measure Help needed turning from your back to your side while in a flat bed without using bedrails?: None Help needed moving from lying on your back to sitting on the side of a flat bed without using bedrails?: None Help needed moving to and from a bed to a chair (including a wheelchair)?: None Help needed standing up from a chair using your arms (e.g., wheelchair or bedside chair)?: None Help needed to walk in hospital room?: A Little Help needed climbing 3-5 steps with a railing? : A Lot 6 Click Score: 21    End of Session Equipment Utilized During Treatment: Gait belt Activity Tolerance: Patient limited by fatigue;Patient tolerated treatment  well Patient left: in chair;with chair alarm set;with call bell/phone within reach   PT Visit Diagnosis: Unsteadiness on feet (R26.81);Difficulty in walking, not elsewhere classified (R26.2);History of falling (Z91.81);Muscle weakness (generalized) (M62.81)    Time: 2409-7353 PT Time Calculation (min) (ACUTE ONLY): 26 min   Charges:   PT Evaluation $PT Eval Low Complexity: 1 Low          Myles Gip PT, DPT [phone]   Louie Casa 12/13/2018, 11:26 AM

## 2018-12-13 NOTE — Progress Notes (Signed)
[place] at [place] NAME: Diane Mullins    MR#:  [id number]  DATE OF BIRTH:  21-Apr-1933  SUBJECTIVE:  CHIEF COMPLAINT:   Chief Complaint  Patient presents with  . Dizziness  . Abnormal Lab   No dizziness.  Feels weak.  No bowel movements.  REVIEW OF SYSTEMS:    Review of Systems  Constitutional: Positive for malaise/fatigue. Negative for chills and fever.  HENT: Negative for sore throat.   Eyes: Negative for blurred vision, double vision and pain.  Respiratory: Negative for cough, hemoptysis, shortness of breath and wheezing.   Cardiovascular: Negative for chest pain, palpitations, orthopnea and leg swelling.  Gastrointestinal: Negative for abdominal pain, constipation, diarrhea, heartburn, nausea and vomiting.  Genitourinary: Negative for dysuria and hematuria.  Musculoskeletal: Negative for back pain and joint pain.  Skin: Negative for rash.  Neurological: Negative for sensory change, speech change, focal weakness and headaches.  Endo/Heme/Allergies: Does not bruise/bleed easily.  Psychiatric/Behavioral: Negative for depression. The patient is not nervous/anxious.     DRUG ALLERGIES:   Allergies  Allergen Reactions  . Oxycodone     Other reaction(s): Confusion Patient's daughter reported confusion/sensitivity to oxycodone.     VITALS:  Blood pressure (!) 138/45, pulse 62, temperature 97.7 F (36.5 C), temperature source Oral, resp. rate 18, height 5\' 2"  (1.575 m), weight 89 kg, SpO2 99 %.  PHYSICAL EXAMINATION:   Physical Exam  GENERAL:  85 y.o.-year-old patient lying in the bed with no acute distress.  EYES: Pupils equal, round, reactive to light and accommodation. No scleral icterus. Extraocular muscles intact.  HEENT: Head atraumatic, normocephalic. Oropharynx and nasopharynx clear.  NECK:  Supple, no jugular venous distention. No thyroid enlargement, no tenderness.  LUNGS: Normal breath sounds bilaterally, no  wheezing, rales, rhonchi. No use of accessory muscles of respiration.  CARDIOVASCULAR: S1, S2 normal. No murmurs, rubs, or gallops.  ABDOMEN: Soft, nontender, nondistended. Bowel sounds present. No organomegaly or mass.  EXTREMITIES: No cyanosis, clubbing or edema b/l.    NEUROLOGIC: Cranial nerves II through XII are intact. No focal Motor or sensory deficits b/l.   PSYCHIATRIC: The patient is alert and oriented x 3.  SKIN: No obvious rash, lesion, or ulcer.   LABORATORY PANEL:   CBC Recent Labs  Lab 12/11/18 1805 12/12/18 0323  WBC 10.2  --   HGB 7.8* 8.3*  HCT 25.5*  --   PLT 332  --    ------------------------------------------------------------------------------------------------------------------ Chemistries  Recent Labs  Lab 12/11/18 1805 12/13/18 0455  NA 139  --   K 4.3  --   CL 103  --   CO2 22  --   GLUCOSE 157*  --   BUN 21  --   CREATININE 1.09*  --   CALCIUM 8.8*  --   MG 1.6* 1.9  AST 20  --   ALT 11  --   ALKPHOS 63  --   BILITOT 0.7  --    ------------------------------------------------------------------------------------------------------------------  Cardiac Enzymes Recent Labs  Lab 12/11/18 1805  TROPONINI <0.03   ------------------------------------------------------------------------------------------------------------------  RADIOLOGY:  Ct Abdomen Pelvis W Contrast  Result Date: 12/11/2018 CLINICAL DATA:  Constipation and melena. On anticoagulation with symptomatic anemia. EXAM: CT ABDOMEN AND PELVIS WITH CONTRAST TECHNIQUE: Multidetector CT imaging of the abdomen and pelvis was performed using the standard protocol following bolus administration of intravenous contrast. CONTRAST:  147mL OMNIPAQUE IOHEXOL 300 MG/ML  SOLN COMPARISON:  CT 07/05/2018 FINDINGS: Lower chest: Cardiomegaly. Moderate hiatal hernia with  at least 50% of the stomach intrathoracic. No focal airspace disease or pleural fluid. There are coronary artery calcifications.  Hepatobiliary: No focal hepatic abnormality. Layering gallstones within physiologically distended gallbladder. No pericholecystic inflammation. No biliary dilatation. Pancreas: No ductal dilatation or inflammation. Spleen: Normal in size without focal abnormality. Adrenals/Urinary Tract: Mild left adrenal thickening without dominant nodule. Normal right adrenal gland. No hydronephrosis or perinephric edema. Homogeneous renal enhancement with symmetric excretion on delayed phase imaging. Urinary bladder is physiologically distended without wall thickening. Stomach/Bowel: Descending and sigmoid colonic diverticulosis, prominent in the sigmoid colon. No diverticulitis. Moderate volume of stool throughout the colon. No colonic wall thickening or inflammatory change. The appendix is not definitively visualized. Small bowel is unremarkable. No small bowel wall thickening or inflammatory change. Moderate hiatal hernia with approximately 50% of the stomach being intrathoracic. Vascular/Lymphatic: Aortic atherosclerosis without aneurysm. Portal vein is patent. Mesenteric vessels are patent. No adenopathy. Reproductive: Status post hysterectomy. Mixed density left adnexal lesion containing fat, soft tissue density and calcifications measuring 6.4 x 6.0 x 7.0 cm, unchanged allowing for differences in caliper placement. No new adnexal mass. Other: No free air, free fluid, or intra-abdominal fluid collection. Postsurgical change of the anterior abdominal wall. Tiny fat containing umbilical hernia. Musculoskeletal: There are no acute or suspicious osseous abnormalities. Degenerative change throughout spine. Psoas muscles are symmetric without evidence of bleed. IMPRESSION: 1. No acute abnormality in the abdomen/pelvis. 2. Colonic diverticulosis without diverticulitis. Moderate stool burden throughout the colon suggesting constipation. 3. Moderate hiatal hernia with at least 50% of the stomach intrathoracic. 4. Cholelithiasis  without gallbladder inflammation. 5. A 7 cm left ovarian dermoid is unchanged from CT 5 months ago. 6. Coronary artery calcifications. Aortic Atherosclerosis (ICD10-I70.0). Electronically Signed   By: Keith Rake M.D.   On: 12/11/2018 20:27     ASSESSMENT AND PLAN:   *Acute on chronic anemia.  Concern regarding melena at home.  Check stool for occult blood. Transfuse to keep hemoglobin greater than 7. Hold Eliquis and aspirin at this time. Consulted and discussed with Dr. Alice Reichert of GI. On IV Protonix Wait for GI input  *Atrial fibrillation with bradycardia and pauses.  On high-dose amiodarone of 600 mg daily.  Will reduce to 200 mg daily from tomorrow.  *Gait abnormalities.  Chronic.  Worsened by her anemia.  Consult physical therapy.  *Chronic diastolic congestive heart failure is stable.  Euvolemic.  *Diabetes mellitus.  Sliding scale insulin.  All the records are reviewed and case discussed with Care Management/Social Worker Management plans discussed with the patient, family and they are in agreement.  CODE STATUS: FULL CODE  DVT Prophylaxis: SCDs  TOTAL TIME TAKING CARE OF THIS PATIENT: 35 minutes.   POSSIBLE D/C IN 1-2 DAYS, DEPENDING ON CLINICAL CONDITION.  Leia Alf Devansh Riese M.D on 12/13/2018 at 8:59 AM  Between 7am to 6pm - Pager - [phone]  After 6pm go to [email or web address] - password [email or web address] [facility]  Office  [phone]  CC: Primary care physician; [name], MD  Note: This dictation was prepared with Dragon dictation along with smaller phrase technology. Any transcriptional errors that result from this process are unintentional.

## 2018-12-13 NOTE — Progress Notes (Signed)
[place] at [place] NAME: Diane Mullins    MR#:  [id number]  DATE OF BIRTH:  12-23-1932  SUBJECTIVE:  CHIEF COMPLAINT:   Chief Complaint  Patient presents with  . Dizziness  . Abnormal Lab   Feels weak but was able to work with physical therapy. No shortness of breath No pauses on telemetry  REVIEW OF SYSTEMS:    Review of Systems  Constitutional: Positive for malaise/fatigue. Negative for chills and fever.  HENT: Negative for sore throat.   Eyes: Negative for blurred vision, double vision and pain.  Respiratory: Negative for cough, hemoptysis, shortness of breath and wheezing.   Cardiovascular: Negative for chest pain, palpitations, orthopnea and leg swelling.  Gastrointestinal: Negative for abdominal pain, constipation, diarrhea, heartburn, nausea and vomiting.  Genitourinary: Negative for dysuria and hematuria.  Musculoskeletal: Negative for back pain and joint pain.  Skin: Negative for rash.  Neurological: Negative for sensory change, speech change, focal weakness and headaches.  Endo/Heme/Allergies: Does not bruise/bleed easily.  Psychiatric/Behavioral: Negative for depression. The patient is not nervous/anxious.     DRUG ALLERGIES:   Allergies  Allergen Reactions  . Oxycodone     Other reaction(s): Confusion Patient's daughter reported confusion/sensitivity to oxycodone.     VITALS:  Blood pressure (!) 138/45, pulse 62, temperature 97.7 F (36.5 C), temperature source Oral, resp. rate 18, height 5\' 2"  (1.575 m), weight 89 kg, SpO2 99 %.  PHYSICAL EXAMINATION:   Physical Exam  GENERAL:  85 y.o.-year-old patient lying in the bed with no acute distress.  EYES: Pupils equal, round, reactive to light and accommodation. No scleral icterus. Extraocular muscles intact.  HEENT: Head atraumatic, normocephalic. Oropharynx and nasopharynx clear.  NECK:  Supple, no jugular venous distention. No thyroid enlargement, no tenderness.   LUNGS: Normal breath sounds bilaterally, no wheezing, rales, rhonchi. No use of accessory muscles of respiration.  CARDIOVASCULAR: S1, S2 normal. No murmurs, rubs, or gallops.  ABDOMEN: Soft, nontender, nondistended. Bowel sounds present. No organomegaly or mass.  EXTREMITIES: No cyanosis, clubbing or edema b/l.    NEUROLOGIC: Cranial nerves II through XII are intact. No focal Motor or sensory deficits b/l.   PSYCHIATRIC: The patient is alert and oriented x 3.  SKIN: No obvious rash, lesion, or ulcer.   LABORATORY PANEL:   CBC Recent Labs  Lab 12/13/18 1036  WBC 7.5  HGB 9.0*  HCT 28.4*  PLT 314   ------------------------------------------------------------------------------------------------------------------ Chemistries  Recent Labs  Lab 12/11/18 1805 12/13/18 0455 12/13/18 1036  NA 139  --  139  K 4.3  --  4.0  CL 103  --  103  CO2 22  --  25  GLUCOSE 157*  --  209*  BUN 21  --  10  CREATININE 1.09*  --  1.04*  CALCIUM 8.8*  --  8.8*  MG 1.6* 1.9  --   AST 20  --   --   ALT 11  --   --   ALKPHOS 63  --   --   BILITOT 0.7  --   --    ------------------------------------------------------------------------------------------------------------------  Cardiac Enzymes Recent Labs  Lab 12/11/18 1805  TROPONINI <0.03   ------------------------------------------------------------------------------------------------------------------  RADIOLOGY:  Ct Abdomen Pelvis W Contrast  Result Date: 12/11/2018 CLINICAL DATA:  Constipation and melena. On anticoagulation with symptomatic anemia. EXAM: CT ABDOMEN AND PELVIS WITH CONTRAST TECHNIQUE: Multidetector CT imaging of the abdomen and pelvis was performed using the standard protocol following bolus administration of intravenous  contrast. CONTRAST:  15mL OMNIPAQUE IOHEXOL 300 MG/ML  SOLN COMPARISON:  CT 07/05/2018 FINDINGS: Lower chest: Cardiomegaly. Moderate hiatal hernia with at least 50% of the stomach intrathoracic. No  focal airspace disease or pleural fluid. There are coronary artery calcifications. Hepatobiliary: No focal hepatic abnormality. Layering gallstones within physiologically distended gallbladder. No pericholecystic inflammation. No biliary dilatation. Pancreas: No ductal dilatation or inflammation. Spleen: Normal in size without focal abnormality. Adrenals/Urinary Tract: Mild left adrenal thickening without dominant nodule. Normal right adrenal gland. No hydronephrosis or perinephric edema. Homogeneous renal enhancement with symmetric excretion on delayed phase imaging. Urinary bladder is physiologically distended without wall thickening. Stomach/Bowel: Descending and sigmoid colonic diverticulosis, prominent in the sigmoid colon. No diverticulitis. Moderate volume of stool throughout the colon. No colonic wall thickening or inflammatory change. The appendix is not definitively visualized. Small bowel is unremarkable. No small bowel wall thickening or inflammatory change. Moderate hiatal hernia with approximately 50% of the stomach being intrathoracic. Vascular/Lymphatic: Aortic atherosclerosis without aneurysm. Portal vein is patent. Mesenteric vessels are patent. No adenopathy. Reproductive: Status post hysterectomy. Mixed density left adnexal lesion containing fat, soft tissue density and calcifications measuring 6.4 x 6.0 x 7.0 cm, unchanged allowing for differences in caliper placement. No new adnexal mass. Other: No free air, free fluid, or intra-abdominal fluid collection. Postsurgical change of the anterior abdominal wall. Tiny fat containing umbilical hernia. Musculoskeletal: There are no acute or suspicious osseous abnormalities. Degenerative change throughout spine. Psoas muscles are symmetric without evidence of bleed. IMPRESSION: 1. No acute abnormality in the abdomen/pelvis. 2. Colonic diverticulosis without diverticulitis. Moderate stool burden throughout the colon suggesting constipation. 3. Moderate  hiatal hernia with at least 50% of the stomach intrathoracic. 4. Cholelithiasis without gallbladder inflammation. 5. A 7 cm left ovarian dermoid is unchanged from CT 5 months ago. 6. Coronary artery calcifications. Aortic Atherosclerosis (ICD10-I70.0). Electronically Signed   By: Keith Rake M.D.   On: 12/11/2018 20:27     ASSESSMENT AND PLAN:   *Acute on chronic anemia.  Concern regarding melena at home.  Check stool for occult blood. Transfuse to keep hemoglobin greater than 7. Hold Eliquis and aspirin at this time. Consulted and discussed with Dr. Alice Reichert of GI.  Dr. Vicente Males following patient now On IV Protonix Waiting for colonoscopy.  Likely tomorrow  *Atrial fibrillation with bradycardia and pauses.  Was on high-dose amiodarone of 600 mg daily.   Reduce to 200 mg from today  *Gait abnormalities.  Chronic.  Worsened by her anemia.  Consulted physical therapy. Skilled nursing facility at discharge  *Chronic diastolic congestive heart failure is stable.  Euvolemic.  *Diabetes mellitus.  Sliding scale insulin.  All the records are reviewed and case discussed with Care Management/Social Worker Management plans discussed with the patient, family and they are in agreement.  CODE STATUS: FULL CODE  DVT Prophylaxis: SCDs  TOTAL TIME TAKING CARE OF THIS PATIENT: 35 minutes.   POSSIBLE D/C IN 1-2 DAYS, DEPENDING ON CLINICAL CONDITION.  Leia Alf Neysha Criado M.D on 12/13/2018 at 1:57 PM  Between 7am to 6pm - Pager - [phone]  After 6pm go to [email or web address] - password [email or web address] [facility]  Office  [phone]  CC: Primary care physician; [name], MD  Note: This dictation was prepared with Dragon dictation along with smaller phrase technology. Any transcriptional errors that result from this process are unintentional.

## 2018-12-13 NOTE — Care Management Important Message (Signed)
Important Message  Patient Details  Name: Diane Mullins MRN: [id number] Date of Birth: [birth year]   Medicare Important Message Given:  Yes    Diane Mullins 12/13/2018, 1:49 PM

## 2018-12-13 NOTE — Progress Notes (Signed)
Diane Mullins , MD [address] [place], [place], [place], [address] [address], [place], [place], [place], [address] Phone: [phone]  Fax: [phone]   Diane Mullins is being followed for anemia  Day 1 of follow up   Subjective: 2 black bowel movements    Objective: Vital signs in last 24 hours: Vitals:   12/12/18 1608 12/12/18 1955 12/13/18 0446 12/13/18 0830  BP: (!) 125/94 (!) 122/49 (!) 124/43 (!) 138/45  Pulse: (!) 56 (!) 55 65 62  Resp: 18 18 18 18   Temp: (!) 97.4 F (36.3 C) 97.9 F (36.6 C) 97.9 F (36.6 C) 97.7 F (36.5 C)  TempSrc: Oral Oral Oral Oral  SpO2: 99% 97% 96% 99%  Weight:      Height:       Weight change:   Intake/Output Summary (Last 24 hours) at 12/13/2018 4132 Last data filed at 12/13/2018 4401 Gross per 24 hour  Intake 600 ml  Output -  Net 600 ml     Exam: Heart:: Regular rate and rhythm, S1S2 present or without murmur or extra heart sounds Lungs: normal, clear to auscultation and clear to auscultation and percussion Abdomen: soft, nontender, normal bowel sounds   Lab Results: @LABTEST2 @ Micro Results: No results found for this or any previous visit (from the past 240 hour(s)). Studies/Results: Ct Abdomen Pelvis W Contrast  Result Date: 12/11/2018 CLINICAL DATA:  Constipation and melena. On anticoagulation with symptomatic anemia. EXAM: CT ABDOMEN AND PELVIS WITH CONTRAST TECHNIQUE: Multidetector CT imaging of the abdomen and pelvis was performed using the standard protocol following bolus administration of intravenous contrast. CONTRAST:  180mL OMNIPAQUE IOHEXOL 300 MG/ML  SOLN COMPARISON:  CT 07/05/2018 FINDINGS: Lower chest: Cardiomegaly. Moderate hiatal hernia with at least 50% of the stomach intrathoracic. No focal airspace disease or pleural fluid. There are coronary artery calcifications. Hepatobiliary: No focal hepatic abnormality. Layering gallstones within physiologically distended gallbladder. No pericholecystic  inflammation. No biliary dilatation. Pancreas: No ductal dilatation or inflammation. Spleen: Normal in size without focal abnormality. Adrenals/Urinary Tract: Mild left adrenal thickening without dominant nodule. Normal right adrenal gland. No hydronephrosis or perinephric edema. Homogeneous renal enhancement with symmetric excretion on delayed phase imaging. Urinary bladder is physiologically distended without wall thickening. Stomach/Bowel: Descending and sigmoid colonic diverticulosis, prominent in the sigmoid colon. No diverticulitis. Moderate volume of stool throughout the colon. No colonic wall thickening or inflammatory change. The appendix is not definitively visualized. Small bowel is unremarkable. No small bowel wall thickening or inflammatory change. Moderate hiatal hernia with approximately 50% of the stomach being intrathoracic. Vascular/Lymphatic: Aortic atherosclerosis without aneurysm. Portal vein is patent. Mesenteric vessels are patent. No adenopathy. Reproductive: Status post hysterectomy. Mixed density left adnexal lesion containing fat, soft tissue density and calcifications measuring 6.4 x 6.0 x 7.0 cm, unchanged allowing for differences in caliper placement. No new adnexal mass. Other: No free air, free fluid, or intra-abdominal fluid collection. Postsurgical change of the anterior abdominal wall. Tiny fat containing umbilical hernia. Musculoskeletal: There are no acute or suspicious osseous abnormalities. Degenerative change throughout spine. Psoas muscles are symmetric without evidence of bleed. IMPRESSION: 1. No acute abnormality in the abdomen/pelvis. 2. Colonic diverticulosis without diverticulitis. Moderate stool burden throughout the colon suggesting constipation. 3. Moderate hiatal hernia with at least 50% of the stomach intrathoracic. 4. Cholelithiasis without gallbladder inflammation. 5. A 7 cm left ovarian dermoid is unchanged from CT 5 months ago. 6. Coronary artery calcifications.  Aortic Atherosclerosis (ICD10-I70.0). Electronically Signed   By: Threasa Beards  Sanford M.D.   On: 12/11/2018 20:27   Medications: I have reviewed the patient's current medications. Scheduled Meds: . amiodarone  200 mg Oral Daily  . insulin aspart  0-5 Units Subcutaneous QHS  . insulin aspart  0-9 Units Subcutaneous TID WC  . insulin glargine  10 Units Subcutaneous q morning - 10a  . levothyroxine  75 mcg Oral Daily  . metoprolol succinate  25 mg Oral Daily  . [START ON 12/15/2018] pantoprazole  40 mg Intravenous Q12H  . polyethylene glycol  17 g Oral Daily  . simvastatin  20 mg Oral QPM   Continuous Infusions: PRN Meds:.acetaminophen **OR** acetaminophen, bisacodyl, ondansetron **OR** ondansetron (ZOFRAN) IV, senna-docusate   Assessment: Active Problems:   Melena  Diane Mullins is a 85 y.o. y/o female with a history of atrial fibrillation on Eliquis presents to the emergency room with a possible syncopal episode as well as found to have new onset anemia.  Over 2 g drop in hemoglobin since 2 weeks.Black tarry stool since yesterday  With a large intrathoracic hiatal hernia seen on the CAT scan with more than 50% of the stomach in the thorax.  Normal BUN/creatinine ratio.  Last colonoscopy has been 11 years back.  Plan 1.  Replete low electrolytes and would need cardiac evaluation for possible syncopal/near syncopal episode.  Once cleared from the cardiac point of view and when she is off 2 days of Eliquis which would be Saturday , she will require an upper endoscopy to rule out any Lysbeth Galas lesions/AVM that could be bleeding.  2.   Since she is on anticoagulation she will require long-term oral PPI as an outpatient but while she is an inpatient she can receive twice daily IV PPI.  Suggest to check stool H. pylori antigen.  3.  Check CBC and monitor transfuse as needed.  I have discussed alternative options, risks & benefits,  which include, but are not limited to, bleeding, infection,  perforation,respiratory complication & drug reaction.  The patient agrees with this plan & written consent will be obtained.     LOS: 2 days   Diane Bellows, MD 12/13/2018, 9:38 AM

## 2018-12-14 ENCOUNTER — Inpatient Hospital Stay: Payer: PPO

## 2018-12-14 ENCOUNTER — Encounter: Payer: Self-pay | Admitting: Anesthesiology

## 2018-12-14 ENCOUNTER — Encounter
Admission: EM | Disposition: A | Discharge status: Skilled Nursing Facility | Payer: Self-pay | Source: Home / Self Care | Attending: Internal Medicine

## 2018-12-14 ENCOUNTER — Inpatient Hospital Stay: Payer: PPO | Admitting: Anesthesiology

## 2018-12-14 DIAGNOSIS — D649 Anemia, unspecified: Secondary | ICD-10-CM

## 2018-12-14 DIAGNOSIS — Z7189 Other specified counseling: Secondary | ICD-10-CM

## 2018-12-14 DIAGNOSIS — K921 Melena: Principal | ICD-10-CM

## 2018-12-14 DIAGNOSIS — I483 Typical atrial flutter: Secondary | ICD-10-CM

## 2018-12-14 HISTORY — PX: ESOPHAGOGASTRODUODENOSCOPY (EGD) WITH PROPOFOL: SHX5813

## 2018-12-14 LAB — CBC
HCT: 28.6 % — ABNORMAL LOW (ref 36.0–46.0)
HCT: 30 % — ABNORMAL LOW (ref 36.0–46.0)
Hemoglobin: 8.9 g/dL — ABNORMAL LOW (ref 12.0–15.0)
Hemoglobin: 9.2 g/dL — ABNORMAL LOW (ref 12.0–15.0)
MCH: 28.2 pg (ref 26.0–34.0)
MCH: 28.2 pg (ref 26.0–34.0)
MCHC: 31.1 g/dL (ref 30.0–36.0)
MCHC: 30.7 g/dL (ref 30.0–36.0)
MCV: 90.5 fL (ref 80.0–100.0)
MCV: 92 fL (ref 80.0–100.0)
NRBC: 0 % (ref 0.0–0.2)
PLATELETS: 312 10*3/uL (ref 150–400)
Platelets: 306 10*3/uL (ref 150–400)
RBC: 3.16 MIL/uL — ABNORMAL LOW (ref 3.87–5.11)
RBC: 3.26 MIL/uL — ABNORMAL LOW (ref 3.87–5.11)
RDW: 16 % — ABNORMAL HIGH (ref 11.5–15.5)
RDW: 16.2 % — ABNORMAL HIGH (ref 11.5–15.5)
WBC: 7.4 10*3/uL (ref 4.0–10.5)
WBC: 7.3 10*3/uL (ref 4.0–10.5)
nRBC: 0 % (ref 0.0–0.2)

## 2018-12-14 LAB — GLUCOSE, CAPILLARY
GLUCOSE-CAPILLARY: 212 mg/dL — AB (ref 70–99)
Glucose-Capillary: 122 mg/dL — ABNORMAL HIGH (ref 70–99)
Glucose-Capillary: 115 mg/dL — ABNORMAL HIGH (ref 70–99)
Glucose-Capillary: 240 mg/dL — ABNORMAL HIGH (ref 70–99)
Glucose-Capillary: 153 mg/dL — ABNORMAL HIGH (ref 70–99)

## 2018-12-14 LAB — H. PYLORI ANTIGEN, STOOL: H. Pylori Stool Ag, Eia: NEGATIVE

## 2018-12-14 SURGERY — ESOPHAGOGASTRODUODENOSCOPY (EGD) WITH PROPOFOL
Anesthesia: General

## 2018-12-14 MED ORDER — ASPIRIN EC 81 MG PO TBEC
81.0000 mg | DELAYED_RELEASE_TABLET | Freq: Every day | ORAL | Status: DC
  Administered 2018-12-14 – 2018-12-15 (×2): 81 mg via ORAL
  Filled 2018-12-14: qty 1

## 2018-12-14 MED ORDER — PROPOFOL 500 MG/50ML IV EMUL
INTRAVENOUS | Status: DC | PRN
  Administered 2018-12-14: 200 ug/kg/min via INTRAVENOUS

## 2018-12-14 MED ORDER — IOHEXOL 350 MG/ML SOLN
75.0000 mL | Freq: Once | INTRAVENOUS | Status: AC | PRN
  Administered 2018-12-14: 75 mL via INTRAVENOUS

## 2018-12-14 MED ORDER — SODIUM CHLORIDE 0.9 % IV SOLN
INTRAVENOUS | Status: DC
  Administered 2018-12-14: 09:00:00 via INTRAVENOUS

## 2018-12-14 MED ORDER — LIDOCAINE HCL (CARDIAC) PF 100 MG/5ML IV SOSY
PREFILLED_SYRINGE | INTRAVENOUS | Status: DC | PRN
  Administered 2018-12-14: 100 mg via INTRAVENOUS

## 2018-12-14 MED ORDER — LACTATED RINGERS IV SOLN
INTRAVENOUS | Status: DC | PRN
  Administered 2018-12-14: 09:00:00 via INTRAVENOUS

## 2018-12-14 MED ORDER — PROPOFOL 10 MG/ML IV BOLUS
INTRAVENOUS | Status: DC | PRN
  Administered 2018-12-14: 50 mg via INTRAVENOUS

## 2018-12-14 MED ORDER — LORAZEPAM 2 MG/ML IJ SOLN
1.0000 mg | Freq: Once | INTRAMUSCULAR | Status: AC | PRN
  Administered 2018-12-14: 1 mg via INTRAVENOUS
  Filled 2018-12-14: qty 1

## 2018-12-14 MED ORDER — PROPOFOL 500 MG/50ML IV EMUL
INTRAVENOUS | Status: AC
  Filled 2018-12-14: qty 50

## 2018-12-14 MED ORDER — SODIUM CHLORIDE 0.9 % IV SOLN
INTRAVENOUS | Status: DC
  Administered 2018-12-14 – 2018-12-15 (×2): via INTRAVENOUS

## 2018-12-14 MED ORDER — APIXABAN 5 MG PO TABS: 5.0000 mg | ORAL_TABLET | Freq: Two times a day (BID) | ORAL | Status: DC

## 2018-12-14 NOTE — Plan of Care (Signed)
  Problem: Education: Goal: Ability to identify signs and symptoms of gastrointestinal bleeding will improve Outcome: Progressing   Problem: Bowel/Gastric: Goal: Will show no signs and symptoms of gastrointestinal bleeding Outcome: Progressing   Problem: Fluid Volume: Goal: Will show no signs and symptoms of excessive bleeding Outcome: Progressing   Problem: Clinical Measurements: Goal: Complications related to the disease process, condition or treatment will be avoided or minimized Outcome: Progressing   Problem: Health Behavior/Discharge Planning: Goal: Ability to manage health-related needs will improve Outcome: Progressing   Problem: Clinical Measurements: Goal: Ability to maintain clinical measurements within normal limits will improve Outcome: Progressing Goal: Will remain free from infection Outcome: Progressing Goal: Respiratory complications will improve Outcome: Progressing   Problem: Activity: Goal: Risk for activity intolerance will decrease Outcome: Progressing   Problem: Nutrition: Goal: Adequate nutrition will be maintained Outcome: Progressing   Problem: Coping: Goal: Level of anxiety will decrease Outcome: Progressing   Problem: Elimination: Goal: Will not experience complications related to bowel motility Outcome: Progressing   Problem: Pain Managment: Goal: General experience of comfort will improve Outcome: Progressing   Problem: Safety: Goal: Ability to remain free from injury will improve Outcome: Progressing   Problem: Skin Integrity: Goal: Risk for impaired skin integrity will decrease Outcome: Progressing

## 2018-12-14 NOTE — Significant Event (Signed)
Rapid Response Event Note  Overview: Time Called: 1497 Arrival Time: 1740 Event Type: Neurologic  Initial Focused Assessment: pt experiencing some slurred speech, VSS.   Interventions:Dr Katy May-Dodd in to assess pt. CT scan ordered, taken to CT by RN supervisor Malachy Mood.   Plan of Care (if not transferred): Dr Nicola Girt will notify Malka, 1C RN- if further evaluation needed.  Event Summary: Name of Physician Notified: Dr Valetta Fuller Mayo-Dodd at 1737    at    Outcome: Stayed in room and stabalized     Sherry Blackard A

## 2018-12-14 NOTE — Anesthesia Preprocedure Evaluation (Signed)
Anesthesia Evaluation  Patient identified by MRN, date of birth, ID band Patient awake    Reviewed: Allergy & Precautions, NPO status , Patient's Chart, lab work & pertinent test results  History of Anesthesia Complications Negative for: history of anesthetic complications  Airway Mallampati: III  TM Distance: >3 FB Neck ROM: Full    Dental no notable dental hx. (+) Dental Advidsory Given   Pulmonary shortness of breath, neg sleep apnea, neg COPD,    + rhonchi  (-) wheezing      Cardiovascular hypertension, + CAD and +CHF  (-) Past MI, (-) Cardiac Stents and (-) CABG + dysrhythmias Atrial Fibrillation  Rhythm:Regular Rate:Normal - Systolic murmurs and - Diastolic murmurs    Neuro/Psych neg Seizures PSYCHIATRIC DISORDERS Anxiety TIACVA    GI/Hepatic negative GI ROS, Neg liver ROS,   Endo/Other  diabetes, Oral Hypoglycemic AgentsHypothyroidism   Renal/GU Renal InsufficiencyRenal disease     Musculoskeletal negative musculoskeletal ROS (+)   Abdominal (+) + obese,   Peds  Hematology negative hematology ROS (+)   Anesthesia Other Findings Past Medical History: 07/17/2014: Benign paroxysmal positional vertigo No date: Chronic diastolic CHF (congestive heart failure) (HCC)     Comment:  a. echo 10/19: EF of 60-65%, no RWMA, Gr2DD, calcified               mitral annulus with mild MR, mildly to moderately dilated              left atrium, moderate TR, PASP 52 mmHg No date: Diabetes mellitus No date: H/O: rheumatic fever No date: Hyperlipidemia No date: Hypertension No date: Pulmonary hypertension (HCC) No date: Stroke [facility])     Comment:  TIA Jan. 1st No date: Teratoma of left ovary No date: Thyroid disease   Reproductive/Obstetrics                             Anesthesia Physical  Anesthesia Plan  ASA: III  Anesthesia Plan: General   Post-op Pain Management:    Induction:  Intravenous  PONV Risk Score and Plan: 2 and Propofol infusion and TIVA  Airway Management Planned: Natural Airway and Nasal Cannula  Additional Equipment:   Intra-op Plan:   Post-operative Plan:   Informed Consent: I have reviewed the patients History and Physical, chart, labs and discussed the procedure including the risks, benefits and alternatives for the proposed anesthesia with the patient or authorized representative who has indicated his/her understanding and acceptance.     Dental advisory given  Plan Discussed with: CRNA and Anesthesiologist  Anesthesia Plan Comments:         Anesthesia Quick Evaluation

## 2018-12-14 NOTE — Transfer of Care (Signed)
Immediate Anesthesia Transfer of Care Note  Patient: BETHANEE REDONDO  Procedure(s) Performed: ESOPHAGOGASTRODUODENOSCOPY (EGD) WITH PROPOFOL (N/A )  Patient Location: PACU  Anesthesia Type:MAC  Level of Consciousness: sedated  Airway & Oxygen Therapy: Patient Spontanous Breathing and Patient connected to nasal cannula oxygen  Post-op Assessment: Report given to RN and Post -op Vital signs reviewed and stable  Post vital signs: Reviewed and stable  Last Vitals:  Vitals Value Taken Time  BP    Temp    Pulse 60 12/14/2018  9:29 AM  Resp    SpO2 100 % 12/14/2018  9:29 AM  Vitals shown include unvalidated device data.  Last Pain:  Vitals:   12/14/18 0904  TempSrc:   PainSc: 0-No pain         Complications: No apparent anesthesia complications

## 2018-12-14 NOTE — Op Note (Signed)
[facility] Gastroenterology Patient Name: Diane Mullins Procedure Date: 12/14/2018 8:48 AM MRN: [id number] Account #: [id number] Date of Birth: 23-Mar-1933 Admit Type: Inpatient Age: 85 Room: [facility] ENDO ROOM 4 Gender: Female Note Status: Finalized Procedure:            Upper GI endoscopy Indications:          Melena Providers:            Lucilla Lame MD, MD Referring MD:         Angela Adam. Caryl Bis (Referring MD) Medicines:            Propofol per Anesthesia Complications:        No immediate complications. Procedure:            Pre-Anesthesia Assessment:                       - Prior to the procedure, a History and Physical was                        performed, and patient medications and allergies were                        reviewed. The patient's tolerance of previous                        anesthesia was also reviewed. The risks and benefits of                        the procedure and the sedation options and risks were                        discussed with the patient. All questions were                        answered, and informed consent was obtained. Prior                        Anticoagulants: The patient has taken Eliquis                        (apixaban), last dose was 2 days prior to procedure.                        ASA Grade Assessment: III - A patient with severe                        systemic disease. After reviewing the risks and                        benefits, the patient was deemed in satisfactory                        condition to undergo the procedure.                       After obtaining informed consent, the endoscope was                        passed under direct vision. Throughout the procedure,  the patient's blood pressure, pulse, and oxygen                        saturations were monitored continuously. The Endoscope                        was introduced through the mouth, and advanced to the    third part of duodenum. The upper GI endoscopy was                        accomplished without difficulty. The patient tolerated                        the procedure well. Findings:      A large hiatal hernia was present.      The stomach was normal.      The examined duodenum was normal. Impression:           - Large hiatal hernia.                       - Normal stomach.                       - Normal examined duodenum.                       - No specimens collected. Recommendation:       - Return patient to hospital ward for ongoing care.                       - Resume regular diet.                       - Continue present medications.                       - In light of the patients age and no sign of any                        active bleeding, I would hold off on a colonoscopy for                        now. If Hb does not improve then I would re-consider. Procedure Code(s):    --- Professional ---                       [phone], Esophagogastroduodenoscopy, flexible, transoral;                        diagnostic, including collection of specimen(s) by                        brushing or washing, when performed (separate procedure) Diagnosis Code(s):    --- Professional ---                       K92.1, Melena (includes Hematochezia) CPT copyright 2019 [employer]. All rights reserved. The codes documented in this report are preliminary and upon coder review may  be revised to meet current compliance requirements. Lucilla Lame MD, MD 12/14/2018 9:25:53 AM This report has been signed electronically. Number of Addenda: 0  Note Initiated On: 12/14/2018 8:48 AM      [facility]

## 2018-12-14 NOTE — Anesthesia Post-op Follow-up Note (Signed)
Anesthesia QCDR form completed.        

## 2018-12-14 NOTE — Progress Notes (Signed)
Ch responded to a code for pt in 127. Pt seemed stable upon ch arrival. Pt was being removed from [facility] an ambulated to bed w/ assistance to be transferred to CT scan. Ch shared w/ pt that f/u would happen once the pt returned and was settled in her room. F/u recommended.    12/14/18 1700  Clinical Encounter Type  Visited With Patient;Health care provider  Visit Type Psychological support;Spiritual support;Social support;Code  Referral From Nurse  Consult/Referral To Chaplain  Recommendations f/u w/ pt post CT scan   Spiritual Encounters  Spiritual Needs Grief support;Emotional  Stress Factors  Patient Stress Factors Exhausted;Health changes;Major life changes  Family Stress Factors None identified

## 2018-12-14 NOTE — Consult Note (Signed)
Cardiology Consultation:   Patient ID: Diane Mullins MRN: [id number]; DOB: September 11, 1933  Admit date: 12/11/2018 Date of Consult: 12/14/2018  Primary Care Provider: [name], MD Primary Cardiologist: Ida Rogue, MD  Physician requesting consult: Dr. Benjie Karvonen Reason for consult, atrial flutter, GI bleed, management of anticoagulation, preop clearance for endoscopy   Patient Profile:   TAHIRI Mullins is a 85 y.o. female with a hx of atrial flutter with rapid ventricular response s/p TEE/DCCV (10/22/2018 and again November 26, 2018), chronic diastolic heart failure, type 2 diabetes, hypertension, hyperlipidemia previous CVA/TIA, h/o anemia, COPD, and vertigo, hospitalized November 25, 2022  atrial flutter with rapid ventricular response requiring diuresis and cardioversion  on Eliquis, presenting by referral from primary care to go to the emergency room with dizziness, near syncope past week or 2 noted to have anemia hemoglobin 8.6  History of Present Illness:   Diane Mullins is an 85 year old female with recent hospital admission as detailed below Presenting with presyncope episodes for the past week, shortness of breath Primary care did lab work showing low hemoglobin and was sent to the emergency room Reports having constipation for 2 weeks, Hemoglobin 7.8 in the emergency room Notes indicating strong family history of colon cancer No prior endoscopy, last colonoscopy several years ago In the emergency room started on Protonix, given 1 unit packed red blood cells  CT scan was ordered of her abdomen and GI was consulted CT scan showed no acute abnormality, diverticulosis without diverticulitis, moderate stool burden, moderate-sized hiatal hernia, cholelithiasis, coronary artery calcifications and aortic atherosclerosis  Scheduled for EGD this morning with Dr. Allen Norris Eliquis has been held  recent admission to [facility] was 10/17/2018, shortness of breath and respiratory distress  in atrial flutter  with 2: 1 conduction and RVR.   difficulty controlling rate,  underwent TEE / cardioversion (10/22/2018)  on anticoagulation with apixaban.  Following DCCV,  some episodes of SB and junctional rhythm with brief runs of AT.  Seen by Dr. Jolyn Nap (10/24/2018) with recommendation  to continue anticoagulation given her h/o TIA and high risk for thromboembolism. Anticoagulation was continued, despite her history of anemia, with recommendation to treat the anemia over discontinuing her anticoagulation.  She was discharged 10/28/2018.  On 11/25/2018, patient presented to [facility] ED with complaint of rapid heart rate and palpitations that woke her from sleep around 5 AM that morning.    heart rate in the 140s rhythm atrial flutter with rapid ventricular response.    placed on a Cardizem drip ,  metoprolol tartrate and digoxin, as well as amiodarone drip without improvement in rate.   Underwent successful cardioversion November 26, 2018, also required IV Lasix for pulmonary edema    Past Medical History:  Diagnosis Date   Atrial fibrillation ([place])    Benign paroxysmal positional vertigo 07/17/2014   Chronic diastolic CHF (congestive heart failure) ([place])    a. echo 10/19: EF of 60-65%, no RWMA, Gr2DD, calcified mitral annulus with mild MR, mildly to moderately dilated left atrium, moderate TR, PASP 52 mmHg   Diabetes mellitus    H/O: rheumatic fever    Hyperlipidemia    Hypertension    Pulmonary hypertension ([place])    Stroke [facility])    TIA Jan. 1st   Teratoma of left ovary    Thyroid disease     Past Surgical History:  Procedure Laterality Date   ABDOMINAL HYSTERECTOMY  1973   menorrhagia   CARDIOVERSION N/A 10/22/2018   Procedure: CARDIOVERSION;  Surgeon: Nelva Bush,  MD;  Location: [facility] ORS;  Service: Cardiovascular;  Laterality: N/A;   CARDIOVERSION N/A 11/26/2018   Procedure: CARDIOVERSION (CATH LAB);  Surgeon: Minna Merritts, MD;  Location: [facility] ORS;  Service: Cardiovascular;   Laterality: N/A;   HERNIA REPAIR  1994   ORIF ANKLE FRACTURE Right 10/29/2015   Procedure: OPEN REDUCTION INTERNAL FIXATION (ORIF) ANKLE FRACTURE;  Surgeon: Thornton Park, MD;  Location: [facility] ORS;  Service: Orthopedics;  Laterality: Right;   TEE WITHOUT CARDIOVERSION N/A 10/22/2018   Procedure: TRANSESOPHAGEAL ECHOCARDIOGRAM (TEE);  Surgeon: Nelva Bush, MD;  Location: [facility] ORS;  Service: Cardiovascular;  Laterality: N/A;   VAGINAL DELIVERY     x4     Home Medications:  Prior to Admission medications   Medication Sig Start Date End Date Taking? Authorizing Provider  acetaminophen (TYLENOL) 325 MG tablet Take 650 mg by mouth every 6 (six) hours as needed.   Yes [provider]  apixaban (ELIQUIS) 5 MG TABS tablet Take 1 tablet (5 mg total) by mouth 2 (two) times daily. 10/28/18  Yes Henreitta Leber, MD  aspirin EC 81 MG tablet Take 81 mg by mouth daily.   Yes [provider]  ferrous sulfate 325 (65 FE) MG EC tablet Take 1 tablet (325 mg total) by mouth daily with breakfast. 08/30/18  Yes Guse, Jacquelynn Cree, FNP  furosemide (LASIX) 20 MG tablet Take 1 tablet (20 mg total) by mouth daily. 07/10/18  Yes Gladstone Lighter, MD  glucose blood (ONE TOUCH ULTRA TEST) test strip Use as instructed to check Blood sugar three times a day.  E11.65 Dispense patient choice. 07/30/18  Yes Guse, Jacquelynn Cree, FNP  ibuprofen (ADVIL,MOTRIN) 200 MG tablet Take 200 mg by mouth every 6 (six) hours as needed.   Yes [provider]  Insulin Glargine (LANTUS SOLOSTAR) 100 UNIT/ML Solostar Pen INJECT  17 UNITS SUBCUTANEOUSLY IN THE MORNING 08/27/18  Yes Guse, Jacquelynn Cree, FNP  Insulin Pen Needle (PEN NEEDLES) 32G X 4 MM MISC Inject 1 Dose into the skin daily. 12/19/16  Yes [name], MD  levothyroxine (SYNTHROID, LEVOTHROID) 75 MCG tablet Take 1 tablet (75 mcg total) by mouth daily. 04/09/18  Yes [name], MD  losartan (COZAAR) 50 MG tablet Take 50 mg by mouth daily.   Yes  [provider]  metFORMIN (GLUCOPHAGE) 1000 MG tablet Take 1 tablet (1,000 mg total) by mouth 2 (two) times daily with a meal. TAKE 1 TABLET TWICE DAILY  WITH  A  MEAL 04/09/18  Yes [name], MD  metoprolol succinate (TOPROL-XL) 25 MG 24 hr tablet Take 1 tablet (25 mg total) by mouth daily. 08/27/18  Yes Guse, Jacquelynn Cree, FNP  omeprazole (PRILOSEC) 20 MG capsule Take 1 capsule (20 mg total) by mouth daily. 04/09/18  Yes [name], MD  polyethylene glycol [facility] / GLYCOLAX) packet Take 17 g by mouth daily as needed.    Yes [provider]  potassium chloride (K-DUR) 10 MEQ tablet TAKE 1 TABLET BY MOUTH EVERY DAY 09/12/18  Yes [name], MD  simvastatin (ZOCOR) 40 MG tablet Take 1 tablet (40 mg total) by mouth every evening. 04/09/18  Yes [name], MD    Inpatient Medications: Scheduled Meds:  amiodarone  200 mg Oral Daily   aspirin EC  81 mg Oral Daily   insulin aspart  0-5 Units Subcutaneous QHS   insulin aspart  0-9 Units Subcutaneous TID WC   insulin glargine  10 Units Subcutaneous q  morning - 10a   levothyroxine  75 mcg Oral Daily   pantoprazole  40 mg Intravenous Q12H   polyethylene glycol  17 g Oral Daily   simvastatin  20 mg Oral QPM   Continuous Infusions:  PRN Meds: acetaminophen **OR** acetaminophen, bisacodyl, ondansetron **OR** ondansetron (ZOFRAN) IV, senna-docusate  Allergies:    Allergies  Allergen Reactions   Oxycodone     Other reaction(s): Confusion Patient's daughter reported confusion/sensitivity to oxycodone.     Social History:   Social History   Socioeconomic History   Marital status: Widowed    Spouse name: Not on file   Number of children: 4   Years of education: Not on file   Highest education level: Not on file  Occupational History   Not on file  Social Needs   Financial resource strain: Not hard at all   Food insecurity:    Worry: Never true    Inability: Never true    Transportation needs:    Medical: No    Non-medical: No  Tobacco Use   Smoking status: Never Smoker   Smokeless tobacco: Never Used  Substance and Sexual Activity   Alcohol use: No   Drug use: No   Sexual activity: Never  Lifestyle   Physical activity:    Days per week: 3 days    Minutes per session: 50 min   Stress: Not at all  Relationships   Social connections:    Talks on phone: Not on file    Gets together: Not on file    Attends religious service: Not on file    Active member of club or organization: Not on file    Attends meetings of clubs or organizations: Not on file    Relationship status: Not on file   Intimate partner violence:    Fear of current or ex partner: Not on file    Emotionally abused: Not on file    Physically abused: Not on file    Forced sexual activity: Not on file  Other Topics Concern   Not on file  Social History Narrative   Lives in [place] alone. No pets. Retired from [employer].   Diet: healthy   Exercise: water aerobics twice weekly, balance class   Ambulates with a walker at baseline    Family History:    Family History  Problem Relation Age of Onset   Heart disease Mother 71   Heart attack Mother 16   Cancer Father        colon   Diabetes Sister    Hypertension Sister    Cancer Brother        kidney   Diabetes Brother    Cancer Daughter        ovarian   Non-Hodgkin's lymphoma Son      ROS:  Please see the history of present illness.  Review of Systems  Constitutional: Negative for chills and fever.  HENT: Positive for hearing loss.        Chronic hearing loss (not currently wearing hearing aids)  Respiratory: Negative.   Cardiovascular: Negative.   Gastrointestinal: Negative for abdominal pain, blood in stool and melena.  Genitourinary: Negative for hematuria.  Musculoskeletal: Negative for myalgias.  Psychiatric/Behavioral: Negative for substance abuse.  All other systems reviewed and are negative.      Physical Exam/Data:   Vitals:   12/14/18 0940 12/14/18 0950 12/14/18 0959 12/14/18 1019  BP: 138/61 (!) 152/40 (!) 131/58 (!) 135/51  Pulse: (!) 59 (!) 58 Marland Kitchen)  58 (!) 58  Resp: 14 16 16 16   Temp:   98 F (36.7 C) 97.7 F (36.5 C)  TempSrc:    Oral  SpO2: 99% 100% 98% 95%  Weight:      Height:        Intake/Output Summary (Last 24 hours) at 12/14/2018 1223 Last data filed at 12/14/2018 0945 Gross per 24 hour  Intake 710 ml  Output --  Net 710 ml   Filed Weights   12/11/18 1754 12/11/18 2234  Weight: 88 kg 89 kg   Body mass index is 35.87 kg/m.  General: Elderly female in no acute distress HEENT: normal, on nasal cannula oxygen, extremely hard of hearing and without hearing aids Neck: Mild JVD Vascular: No carotid bruits; radial pulses 2+ bilaterally Cardiac: Regular rhythm, tachycardic; no murmur  Lungs: Slight bibasilar crackles, no wheezing, rhonchi or rales  Abd: soft, nontender, no hepatomegaly  Ext: Nonpitting bilateral lower extremity edema Musculoskeletal:  No deformities, BUE and BLE strength normal and equal Skin: warm and dry  Neuro:   no focal abnormalities noted Psych:  Normal affect   EKG: EKG showing normal sinus rhythm no significant ST or T wave changes  Telemetry:  Telemetry was personally reviewed and demonstrates: Atrial flutter with RVR and ventricular rates 130-140s  Relevant CV Studies: Echocardiogram in October 2019: Study Conclusions - Left ventricle: The cavity size was normal. Systolic function was normal. The estimated ejection fraction was in the range of 60% to 65%. Wall motion was normal; there were no regional wall motion abnormalities. Features are consistent with a pseudonormal left ventricular filling pattern, with concomitant abnormal relaxation and increased filling pressure (grade 2 diastolic dysfunction). - Mitral valve: Calcified annulus. Mildly thickened, mildly calcified leaflets . There was mild  regurgitation. - Left atrium: The atrium was mildly to moderately dilated. - Tricuspid valve: There was moderate regurgitation. - Pulmonary arteries: Systolic pressure was moderately increased. PA peak pressure: 52 mm Hg (S).  Laboratory Data:  Chemistry Recent Labs  Lab 12/10/18 1425 12/11/18 1805 12/13/18 1036  NA 140 139 139  K 4.4 4.3 4.0  CL 101 103 103  CO2 23 22 25   GLUCOSE 179* 157* 209*  BUN 24* 21 10  CREATININE 1.10 1.09* 1.04*  CALCIUM 8.9 8.8* 8.8*  GFRNONAA  --  47* 49*  GFRAA  --  54* 57*  ANIONGAP  --  14 11    Recent Labs  Lab 12/11/18 1805  PROT 6.7  ALBUMIN 3.7  AST 20  ALT 11  ALKPHOS 63  BILITOT 0.7   Hematology Recent Labs  Lab 12/13/18 1036 12/14/18 0815 12/14/18 1154  WBC 7.5 7.4 7.3  RBC 3.13* 3.16* 3.26*  HGB 9.0* 8.9* 9.2*  HCT 28.4* 28.6* 30.0*  MCV 90.7 90.5 92.0  MCH 28.8 28.2 28.2  MCHC 31.7 31.1 30.7  RDW 16.3* 16.0* 16.2*  PLT 314 306 312   Cardiac Enzymes Recent Labs  Lab 12/11/18 1805  TROPONINI <0.03   No results for input(s): TROPIPOC in the last 168 hours.  BNP No results for input(s): BNP, PROBNP in the last 168 hours.  DDimer No results for input(s): DDIMER in the last 168 hours.  Radiology/Studies:  Ct Abdomen Pelvis W Contrast  Result Date: 12/11/2018 CLINICAL DATA:  Constipation and melena. On anticoagulation with symptomatic anemia. EXAM: CT ABDOMEN AND PELVIS WITH CONTRAST TECHNIQUE: Multidetector CT imaging of the abdomen and pelvis was performed using the standard protocol following bolus administration of intravenous contrast. CONTRAST:  141mL OMNIPAQUE IOHEXOL 300 MG/ML  SOLN COMPARISON:  CT 07/05/2018 FINDINGS: Lower chest: Cardiomegaly. Moderate hiatal hernia with at least 50% of the stomach intrathoracic. No focal airspace disease or pleural fluid. There are coronary artery calcifications. Hepatobiliary: No focal hepatic abnormality. Layering gallstones within physiologically distended gallbladder.  No pericholecystic inflammation. No biliary dilatation. Pancreas: No ductal dilatation or inflammation. Spleen: Normal in size without focal abnormality. Adrenals/Urinary Tract: Mild left adrenal thickening without dominant nodule. Normal right adrenal gland. No hydronephrosis or perinephric edema. Homogeneous renal enhancement with symmetric excretion on delayed phase imaging. Urinary bladder is physiologically distended without wall thickening. Stomach/Bowel: Descending and sigmoid colonic diverticulosis, prominent in the sigmoid colon. No diverticulitis. Moderate volume of stool throughout the colon. No colonic wall thickening or inflammatory change. The appendix is not definitively visualized. Small bowel is unremarkable. No small bowel wall thickening or inflammatory change. Moderate hiatal hernia with approximately 50% of the stomach being intrathoracic. Vascular/Lymphatic: Aortic atherosclerosis without aneurysm. Portal vein is patent. Mesenteric vessels are patent. No adenopathy. Reproductive: Status post hysterectomy. Mixed density left adnexal lesion containing fat, soft tissue density and calcifications measuring 6.4 x 6.0 x 7.0 cm, unchanged allowing for differences in caliper placement. No new adnexal mass. Other: No free air, free fluid, or intra-abdominal fluid collection. Postsurgical change of the anterior abdominal wall. Tiny fat containing umbilical hernia. Musculoskeletal: There are no acute or suspicious osseous abnormalities. Degenerative change throughout spine. Psoas muscles are symmetric without evidence of bleed. IMPRESSION: 1. No acute abnormality in the abdomen/pelvis. 2. Colonic diverticulosis without diverticulitis. Moderate stool burden throughout the colon suggesting constipation. 3. Moderate hiatal hernia with at least 50% of the stomach intrathoracic. 4. Cholelithiasis without gallbladder inflammation. 5. A 7 cm left ovarian dermoid is unchanged from CT 5 months ago. 6. Coronary  artery calcifications. Aortic Atherosclerosis (ICD10-I70.0). Electronically Signed   By: Keith Rake M.D.   On: 12/11/2018 20:27    Assessment and Plan:   Anemia/GI bleed ER notes reporting melena and concern for GI bleed Prior hemoglobin 10.6 several weeks ago Down to 7.8 on arrival to the emergency room December 11, 2018 Symptomatic with dizziness and shortness of breath Hemoglobin now 9.2 Scheduled for EGD with Dr. Allen Norris, Results discussed with him, hiatal hernia, no significant pathology Current plan is for no colonoscopy unless she has continued bleeding -Eliquis has been held Does appear she is on low-dose aspirin given history of TIA  Atrial flutter with rapid ventricular response 2 cardioversions in the past 3 months Eliquis has been held Most recent cardioversion November 26, 2018.  Ideally we like anticoagulation for 30 days following procedure Given elevated CHADSVASC would ideally like to keep her on Eliquis but given GI bleed this has been held (-CHA2DS2VASc score of at least 8 (CHF, HTN, agex2, DM2, stroke x2, female).) --Currently on amiodarone 200 mg daily Following procedure short pauses (in PACU) but asymptomatic.  Continue to monitor  Acute on chronic HFpEF Prior history of heart failure in the setting of atrial flutter with RVR On Lasix 20 daily Reports that she has not had much to eat in the past 2-1/2 days -Would restart Lasix 20 once diet is resumed  HTN - Currently controlled.  Would restart metoprolol, losartan, lasix once tolerating diet  DM2 - SSI, Per IM  Anemia Acute on chronic   Total encounter time more than 110 minutes  Greater than 50% was spent in counseling and coordination of care with the patient   For questions or updates, please contact  [facility] Please consult [email or web address] for contact info under     Signed, Ida Rogue, MD  12/14/2018 12:23 PM

## 2018-12-14 NOTE — TOC Initial Note (Signed)
Transition of Care [facility]) - Initial/Assessment Note    Patient Details  Name: Diane Mullins MRN: [id number] Date of Birth: 22-Apr-1933  Transition of Care [facility]) CM/SW Contact:    Zettie Pho, LCSW Phone Number: 12/14/2018, 4:10 PM  Clinical Narrative:  The CSW met with the patient at bedside to discuss discharge planning including the patient's PT recommendation for SNF. The patient shared that she was recently at [facility] and would be willing to return, but that she also did not want to choose between [facility] and SNF until tomorrow. The patient provided permission to begin a SNF referral, and she also reported that she has [facility] currently for PT and OT. The CSW provided a list of [place] agencies and a list of SNFs, both from [email or web address].  According to chart notes from [facility] at the patient's last admission 11/26/2018, the patient had used around 20 days of benefits for SNF; if this is the case, the patient's co-pay may be prohibitive for her to return to SNF. The patient is an extreme risk of unplanned readmissions due to multiple factors, and a SNF stay may improve her overall outcome. The CSW has screened for transportation, SNF, and home health at this time. TOC team is following.     Expected Discharge Plan: Skilled Nursing Facility Barriers to Discharge: Insurance Authorization, SNF Pending bed offer, Continued Medical Work up   Patient Goals and CMS Choice Patient states their goals for this hospitalization and ongoing recovery are:: "I want to be able to keep making progress and stay out of the hospital." CMS Medicare.gov Compare Post Acute Care list provided to:: Patient Choice offered to / list presented to : Patient  Expected Discharge Plan and Services Expected Discharge Plan: [place] In-house Referral: PCP / [facility], [facility] Discharge Planning Services: CM Consult Post Acute Care Choice: [place] arrangements for the past 2  months: [place] Expected Discharge Date: 12/13/18               DME Arranged: N/A DME Agency: NA HH Arranged: NA [place]: [place] (now [facility])  Prior Living Arrangements/Services Living arrangements for the past 2 months: [place] Lives with:: Self Patient language and need for interpreter reviewed:: Yes Do you feel safe going back to the place where you live?: Yes      Need for Family Participation in Patient Care: No (Comment) Care giver support system in place?: Yes (comment) Current home services: Home PT, Home OT, DME Criminal Activity/Legal Involvement Pertinent to Current Situation/Hospitalization: No - Comment as needed  Activities of Daily Living Home Assistive Devices/Equipment: Environmental consultant (specify type), Eyeglasses, Grab bars around toilet, Shower chair with back ADL Screening (condition at time of admission) Patient's cognitive ability adequate to safely complete daily activities?: Yes Is the patient deaf or have difficulty hearing?: No Does the patient have difficulty seeing, even when wearing glasses/contacts?: No Does the patient have difficulty concentrating, remembering, or making decisions?: No Patient able to express need for assistance with ADLs?: Yes Does the patient have difficulty dressing or bathing?: No Independently performs ADLs?: No Communication: Independent Dressing (OT): Independent Grooming: Independent Feeding: Independent Bathing: Needs assistance Is this a change from baseline?: Change from baseline, expected to last <3 days Toileting: Needs assistance Is this a change from baseline?: Change from baseline, expected to last <3 days In/Out Bed: Needs assistance Is this a change from baseline?: Change from baseline, expected to last <  3 days Walks in Home: Needs assistance Is this a change from baseline?: Change from baseline, expected to last <3 days Does the patient have difficulty  walking or climbing stairs?: Yes Weakness of Legs: Both Weakness of Arms/Hands: None  Permission Sought/Granted Permission sought to share information with : Facility Art therapist granted to share information with : Yes, Verbal Permission Granted              Emotional Assessment Appearance:: Appears stated age Attitude/Demeanor/Rapport: Gracious, Engaged Affect (typically observed): Anxious, Pleasant Orientation: : Oriented to Self, Oriented to Place, Oriented to  Time, Oriented to Situation Alcohol / Substance Use: Never Used Psych Involvement: No (comment)  Admission diagnosis:  Gastrointestinal hemorrhage with melena [K92.1] Symptomatic anemia [D64.9] Constipation, unspecified constipation type [K59.00] Melena [K92.1] Patient Active Problem List   Diagnosis Date Noted  . Symptomatic anemia   . Melena 12/11/2018  . Constipation 12/10/2018  . Atrial flutter ([place]) 10/17/2018  . 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 AKI (acute kidney injury) ([place])   . Acute on chronic diastolic heart failure ([place])   . Respiratory failure ([place]) 07/05/2018  . Coronary artery calcification seen on CAT scan 11/16/2017  . Morbid obesity ([place]) 10/30/2017  . (HFpEF) heart failure with preserved ejection fraction ([place]) 09/28/2017  . Family history of colon cancer 04/17/2017  . Closed right ankle fracture 10/28/2015  . Fatigue 10/18/2015  . TIA (transient ischemic attack) 09/18/2015  . Orthostatic hypotension 08/02/2015  . Adjustment disorder with anxious mood 03/30/2015  . Postmenopausal estrogen deficiency 06/03/2014  . DNR (do not resuscitate) discussion 03/04/2014  . Gait disturbance 05/07/2013  . Diabetes ([place]) 04/21/2013  . Shortness of breath 12/09/2012  . Hearing loss 12/09/2012  . Anxiety 01/31/2012  . Hypertension 12/22/2011  . Hyperlipidemia 12/22/2011  . Hypothyroidism 12/22/2011   PCP:  [name], MD Pharmacy:   [id number] [place],  [address]Phone: [phone]: [phone] [facility] ([facility] [place]Phone: [phone]: [phone]    Social Determinants of Health (SDOH) Interventions    Readmission Risk Interventions Readmission Risk Prevention Plan 12/14/2018 11/26/2018  Transportation Screening Complete Complete  PCP or Specialist Appt within 5-7 Days - Complete  Home Care Screening - Complete  Medication Review (RN CM) - Complete  Medication Review (Press photographer Not Complete -  Med Review Comments Review upon discharge -  PCP or Specialist appointment within 3-5 days of discharge Not Complete -  PCP/Specialist Appt Not Complete comments Review prior to discharge for appointment with cardiology -  [facility] Consult Complete -  SW Recovery Care/Counseling Consult Complete -  Palliative Care Screening Not Applicable -  Skilled Nursing Facility Complete -  Some recent data might be hidden

## 2018-12-14 NOTE — NC FL2 (Signed)
[place] LEVEL OF CARE SCREENING TOOL     IDENTIFICATION  Patient Name: Diane Mullins Birthdate: 26-May-1933 Sex: female Admission Date (Current Location): 12/11/2018  [place] and [place] Number:  Metallurgist and Address:  [facility], [address] [place], [place] [address]      Provider Number: [id number]  Attending Physician Name and Address:  Bettey Costa, MD  Relative Name and Phone Number:  [name] (Daughter) [phone] or [place] Garbe (Daughter) [phone]    Current Level of Care: Hospital Recommended Level of Care: [place] Prior Approval Number:    Date Approved/Denied:   PASRR Number: [id number]  Discharge Plan: SNF    Current Diagnoses: Patient Active Problem List   Diagnosis Date Noted  . Symptomatic anemia   . Melena 12/11/2018  . Constipation 12/10/2018  . Atrial flutter ([place]) 10/17/2018  . 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 AKI (acute kidney injury) ([place])   . Acute on chronic diastolic heart failure ([place])   . Respiratory failure ([place]) 07/05/2018  . Coronary artery calcification seen on CAT scan 11/16/2017  . Morbid obesity ([place]) 10/30/2017  . (HFpEF) heart failure with preserved ejection fraction ([place]) 09/28/2017  . Family history of colon cancer 04/17/2017  . Closed right ankle fracture 10/28/2015  . Fatigue 10/18/2015  . TIA (transient ischemic attack) 09/18/2015  . Orthostatic hypotension 08/02/2015  . Adjustment disorder with anxious mood 03/30/2015  . Postmenopausal estrogen deficiency 06/03/2014  . DNR (do not resuscitate) discussion 03/04/2014  . Gait disturbance 05/07/2013  . Diabetes ([place]) 04/21/2013  . Shortness of breath 12/09/2012  . Hearing loss 12/09/2012  . Anxiety 01/31/2012  . Hypertension 12/22/2011  . Hyperlipidemia 12/22/2011  . Hypothyroidism 12/22/2011    Orientation RESPIRATION BLADDER Height & Weight     Self, Time, Situation, Place  Normal Continent Weight: 196 lb 1.6 oz (89 kg) Height:  5\' 2"  (157.5 cm)  BEHAVIORAL SYMPTOMS/MOOD NEUROLOGICAL BOWEL NUTRITION STATUS      Continent    AMBULATORY STATUS COMMUNICATION OF NEEDS Skin   Extensive Assist Verbally Normal                       Personal Care Assistance Level of Assistance  Bathing, Feeding, Dressing Bathing Assistance: Limited assistance Feeding assistance: Independent Dressing Assistance: Limited assistance     Functional Limitations Info  Sight, Hearing, Speech Sight Info: Adequate Hearing Info: Impaired Speech Info: Adequate    SPECIAL CARE FACTORS FREQUENCY  PT (By licensed PT), OT (By licensed OT)     PT Frequency: 5X OT Frequency: 3X            Contractures Contractures Info: Not present    Additional Factors Info  Code Status, Allergies Code Status Info: Full Allergies Info: Oxycodone           Current Medications (12/14/2018):  This is the current hospital active medication list Current Facility-Administered Medications  Medication Dose Route Frequency Provider Last Rate Last Dose  . acetaminophen (TYLENOL) tablet 650 mg  650 mg Oral Q6H PRN Lucilla Lame, MD       Or  . acetaminophen (TYLENOL) suppository 650 mg  650 mg Rectal Q6H PRN Lucilla Lame, MD      . amiodarone (PACERONE) tablet 200 mg  200 mg Oral Daily Lucilla Lame, MD   200 mg at 12/14/18 1243  . aspirin EC tablet 81 mg  81 mg Oral Daily Bettey Costa, MD   81  mg at 12/14/18 1245  . bisacodyl (DULCOLAX) EC tablet 5 mg  5 mg Oral Daily PRN Lucilla Lame, MD      . insulin aspart (novoLOG) injection 0-5 Units  0-5 Units Subcutaneous QHS Allen Norris, Darren, MD      . insulin aspart (novoLOG) injection 0-9 Units  0-9 Units Subcutaneous TID WC Lucilla Lame, MD   1 Units at 12/14/18 0815  . insulin glargine (LANTUS) injection 10 Units  10 Units Subcutaneous q morning - 10a Lucilla Lame, MD   10 Units at 12/14/18 1244  . levothyroxine (SYNTHROID, LEVOTHROID) tablet 75 mcg  75  mcg Oral Daily Lucilla Lame, MD   75 mcg at 12/13/18 0442  . ondansetron (ZOFRAN) tablet 4 mg  4 mg Oral Q6H PRN Lucilla Lame, MD       Or  . ondansetron (ZOFRAN) injection 4 mg  4 mg Intravenous Q6H PRN Lucilla Lame, MD      . pantoprazole (PROTONIX) injection 40 mg  40 mg Intravenous Q12H Lucilla Lame, MD   40 mg at 12/13/18 2310  . polyethylene glycol (MIRALAX / GLYCOLAX) packet 17 g  17 g Oral Daily Lucilla Lame, MD   17 g at 12/12/18 1025  . senna-docusate (Senokot-S) tablet 1 tablet  1 tablet Oral QHS PRN Lucilla Lame, MD      . simvastatin (ZOCOR) tablet 20 mg  20 mg Oral QPM Lucilla Lame, MD   20 mg at 12/13/18 1730     Discharge Medications: Please see discharge summary for a list of discharge medications.  Relevant Imaging Results:  Relevant Lab Results:   Additional Information SS# [id number]  Zettie Pho, LCSW

## 2018-12-14 NOTE — Progress Notes (Addendum)
Called by RN for new onset slurred speech. Started two hours ago when patient was talking to her daughter on the phone. She denies any numbness, tingling, or weakness of her extremities. She endorses blurred vision, but states this has been going on for a while because she needs new glasses.   On exam, she has +slurred speech. She has 5/5 strength throughout all extremities. Sensation intact to light touch. +abnormal finger-to-nose testing bilaterally (may be due to blurred vision).  -Patient has a history of a flutter and has been off eliquis due to GI bleed -Will call a code stroke -CT head pending -Will order MRI brain- if positive, will need ECHO, carotid dopplers, PT/OT/SLP -Blood glucose normal -Neurology consult  -Attempted to call daughter [name]) to update her, but there was no answer -No need for transfer to the ICU at this time  Hyman Bible, MD

## 2018-12-14 NOTE — Progress Notes (Addendum)
[place] [facility] [place] NAME: Diane Mullins    MR#:  [id number]  DATE OF BIRTH:  [birth year]  SUBJECTIVE:  Patient seen earlier this morning at approximately 8:00 before EGD. No acute issues.  REVIEW OF SYSTEMS:    Review of Systems  Constitutional: Negative for fever, chills weight loss HENT: Negative for ear pain, nosebleeds, congestion, facial swelling, rhinorrhea, neck pain, neck stiffness and ear discharge.   Respiratory: Negative for cough, shortness of breath, wheezing  Cardiovascular: Negative for chest pain, palpitations and leg swelling.  Gastrointestinal: Negative for heartburn, abdominal pain, vomiting, diarrhea or consitpation Genitourinary: Negative for dysuria, urgency, frequency, hematuria Musculoskeletal: Negative for back pain or joint pain Neurological: Negative for dizziness, seizures, syncope, focal weakness,  numbness and headaches.  Hematological: Does not bruise/bleed easily.  Psychiatric/Behavioral: Negative for hallucinations, confusion, dysphoric mood    Tolerating Diet: yes      DRUG ALLERGIES:   Allergies  Allergen Reactions  . Oxycodone     Other reaction(s): Confusion Patient's daughter reported confusion/sensitivity to oxycodone.     VITALS:  Blood pressure (!) 135/51, pulse (!) 58, temperature 97.7 F (36.5 C), temperature source Oral, resp. rate 16, height 5\' 2"  (1.575 m), weight 89 kg, SpO2 95 %.  PHYSICAL EXAMINATION:  Constitutional: Appears well-developed and well-nourished. No distress. HENT: Normocephalic. Marland Kitchen Oropharynx is clear and moist.  Eyes: Conjunctivae and EOM are normal. PERRLA, no scleral icterus.  Neck: Normal ROM. Neck supple. No JVD. No tracheal deviation. CVS: RRR, S1/S2 +, no murmurs, no gallops, no carotid bruit.  Pulmonary: Effort and breath sounds normal, no stridor, rhonchi, wheezes, rales.  Abdominal: Soft. BS +,  no distension, tenderness, rebound or guarding.   Musculoskeletal: Normal range of motion. No edema and no tenderness.  Neuro: Alert. CN 2-12 grossly intact. No focal deficits. Skin: Skin is warm and dry. No rash noted. Psychiatric: Normal mood and affect.      LABORATORY PANEL:   CBC Recent Labs  Lab 12/14/18 1154  WBC 7.3  HGB 9.2*  HCT 30.0*  PLT 312   ------------------------------------------------------------------------------------------------------------------  Chemistries  Recent Labs  Lab 12/11/18 1805 12/13/18 0455 12/13/18 1036  NA 139  --  139  K 4.3  --  4.0  CL 103  --  103  CO2 22  --  25  GLUCOSE 157*  --  209*  BUN 21  --  10  CREATININE 1.09*  --  1.04*  CALCIUM 8.8*  --  8.8*  MG 1.6* 1.9  --   AST 20  --   --   ALT 11  --   --   ALKPHOS 63  --   --   BILITOT 0.7  --   --    ------------------------------------------------------------------------------------------------------------------  Cardiac Enzymes Recent Labs  Lab 12/11/18 1805  TROPONINI <0.03   ------------------------------------------------------------------------------------------------------------------  RADIOLOGY:  No results found.   ASSESSMENT AND PLAN:   85 year old female with a history of PAF on amiodarone and Eliquis who presented to the hospital due to melena.  1.  Melena: Patient's hemoglobin has remained relatively stable.  She underwent EGD earlier this morning. EGD shows large Hiatal hernoa but normal otherwise. As per GI in light of patient's age and no sign of active bleeding no colonoscopy at this time.  2.  PAF: I will resume  Aspirin. Dr Allen Norris says Dr Rockey Situ has recommended since patient is status post cardioversion not to restart Eliquis.  I do not see a  note as of yet by Dr. Rockey Situ. Follow hemoglobin   I will stop  Metoprolol for now 3. Hypothyroid:Continue Synthroid  4. Diabetes: Continue ADA diet and SSI. Management plans discussed with the patient and she is in agreement.  CODE STATUS:  full  TOTAL TIME TAKING CARE OF THIS PATIENT: 28 minutes.     POSSIBLE D/C tomorrow, DEPENDING ON CLINICAL CONDITION.   Bettey Costa M.D on 12/14/2018 at 12:00 PM  Between 7am to 6pm - Pager - [phone] After 6pm go to [email or web address] - password [email or web address] [place] [facility]  Office  [phone]  CC: Primary care physician; [name], MD  Note: This dictation was prepared with Dragon dictation along with smaller phrase technology. Any transcriptional errors that result from this process are unintentional.

## 2018-12-14 NOTE — Anesthesia Postprocedure Evaluation (Signed)
Anesthesia Post Note  Patient: Diane Mullins  Procedure(s) Performed: ESOPHAGOGASTRODUODENOSCOPY (EGD) WITH PROPOFOL (N/A )  Patient location during evaluation: PACU Anesthesia Type: General Level of consciousness: awake and alert Pain management: pain level controlled Vital Signs Assessment: post-procedure vital signs reviewed and stable Respiratory status: spontaneous breathing, nonlabored ventilation, respiratory function stable and patient connected to nasal cannula oxygen Cardiovascular status: blood pressure returned to baseline and stable Postop Assessment: no apparent nausea or vomiting Anesthetic complications: no     Last Vitals:  Vitals:   12/14/18 1019 12/14/18 1641  BP: (!) 135/51 115/63  Pulse: (!) 58 66  Resp: 16 16  Temp: 36.5 C 36.4 C  SpO2: 95% 99%    Last Pain:  Vitals:   12/14/18 1641  TempSrc: Oral  PainSc:                  Martha Clan

## 2018-12-14 NOTE — Consult Note (Addendum)
[facility] TeleNeurology Consult Services   Date of Service:   12/14/2018 18:27:28  Impression:     .  Rule Out Acute Ischemic Stroke  Comments/Sign-Out: Patient is an 85 yo LH female with a PMH of Afib (on eliquis prior to admission), DM, HTN, CHF, HLD who was admitted 12/11/18 with melena. Her eliquis has since been held. She was noticed this evening by her nurse to have dysarthria, not appreciated yesterday evening. Unknown LNK. On exam patient has poor oral praxis with mechanical dyarthria, fluctuating and b/l UE ataxia (R>L), mild right leg drift. Exam suggestive of underlying neurodegerative disorder (differntial includes PSP, MSA, parkinsons, vs motor neuron disease). However, given her risk factors and disconitnuation of AC would pursue stroke w/u. CTA ordered given fluctuating symptoms to exclude critical stenosis. Low clinical suspicion of LVO.  Addendum: 19:42 Floor called to find status if CTA. Radiology has just to bring pt to CT.   Addendum: CTA head and neck negative for LVO. NIR not indicated. MRI brain negative for stroke, chronic small vessel disease noted.   Mechanism of Stroke: Not Clear  Metrics: Last Known Well: Unknown [facility] Notification Time: 12/14/2018 18:27:28 Stamp Time: 12/14/2018 18:27:28 Time First Login Attempt: 12/14/2018 18:32:57 Video Start Time: 12/14/2018 18:32:57  Symptoms: dysarthria, clumsiness NIHSS Start Assessment Time: 12/14/2018 18:35:00 Patient is not a candidate for tPA. Patient was not deemed candidate for tPA thrombolytics because of GIB 12/11/18. Video End Time: 12/14/2018 18:53:33  Lower Likelihood of Large Vessel Occlusion but Following Stat Studies are Recommended  CTA Head and Neck.   Our recommendations are outlined below.  Recommendations:     .  Activate Stroke Protocol Admission/Order Set     .  Stroke/Telemetry Floor     .  Neuro Checks     .  Bedside Swallow Eval     .  DVT  Prophylaxis     .  IV Fluids, Normal Saline     .  Head of Bed Below 30 Degrees     .  Euglycemia and Avoid Hyperthermia (PRN Acetaminophen)     .  ASA 325mg  in cleared by GI   Sign Out:     .  Discussed with Primary Attending    ------------------------------------------------------------------------------  History of Present Illness: Patient is a 85 year old Female.  Inpatient stroke alert was called for symptoms of dysarthria, clumsiness  Patient is an 85 yo LH female with a PMH of Afib (on eliquis prior to admission), DM, HTN, CHF, HLD who was admitted 12/11/18 with melena. Her eliquis has since been held. She was noticed this evening by her nurse to have dysarthria, not appreciated yesterday evening. DIfficult to ascertain a LNK. Patients she has had dysarthria intermittently since her TIA in 09/2018. She also reports noticing clumsiness of her arms today, unclear time of onset. Review of chart reveals that she had an MRI brain 10/2018 for "motor neuron disease" which was negative.  There is no history of hemorrhagic complications or intracranial hemorrhage. There is history of Recent Anticoagulants. There is no history of recent major surgery. There is no history of recent stroke.  Examination: 1A: Level of Consciousness - Alert; keenly responsive + 0 1B: Ask Month and Age - Both Questions Right + 0 1C: Blink Eyes & Squeeze Hands - Performs Both Tasks + 0 2: Test Horizontal Extraocular Movements - Normal + 0 3: Test Visual Fields - No Visual Loss + 0 4: Test Facial Palsy (Use Grimace if Obtunded) - Normal  symmetry + 0 5A: Test Left Arm Motor Drift - No Drift for 10 Seconds + 0 5B: Test Right Arm Motor Drift - No Drift for 10 Seconds + 0 6A: Test Left Leg Motor Drift - No Drift for 5 Seconds + 0 6B: Test Right Leg Motor Drift - Drift, but doesn't hit bed + 1 7: Test Limb Ataxia (FNF/Heel-Shin) - Ataxia in 2 Limbs + 2 8: Test Sensation - Normal; No sensory loss + 0 9: Test  Language/Aphasia - Normal; No aphasia + 0 10: Test Dysarthria - Mild-Moderate Dysarthria: Slurring but can be understood + 1 11: Test Extinction/Inattention - No abnormality + 0  NIHSS Score: 4  Patient was informed the Neurology Consult would happen via TeleHealth consult by way of interactive audio and video telecommunications and consented to receiving care in this manner.  Due to the immediate potential for life-threatening deterioration due to underlying acute neurologic illness, I spent 35 minutes providing critical care. This time includes time for face to face visit via telemedicine, review of medical records, imaging studies and discussion of findings with providers, the patient and/or family.   Dr Ruffin Frederick   [facility] [phone]   Case [id number]

## 2018-12-14 NOTE — Progress Notes (Signed)
Assigned to pt from 1600-1900.  Around 1710 pt finished eating her dinner and meds given.  This Probation officer noted new onset slurred speech.  NIH 3 for ataxia and slurred speech.  Denies vision changes. Neuro assessment done see assessment flowsheet. Per pt it started about 2xhrs ago and that it happens to her when she gets upset.  Pt A& oriented and follow commands.  RN assisted pt to bsc per pt request.  Notified Dr Benjie Karvonen of the above.  STAT CT of the head ordered per MD order.

## 2018-12-15 ENCOUNTER — Encounter: Payer: Self-pay | Admitting: Licensed Clinical Social Worker

## 2018-12-15 DIAGNOSIS — K922 Gastrointestinal hemorrhage, unspecified: Secondary | ICD-10-CM

## 2018-12-15 DIAGNOSIS — K921 Melena: Secondary | ICD-10-CM

## 2018-12-15 DIAGNOSIS — R410 Disorientation, unspecified: Secondary | ICD-10-CM

## 2018-12-15 DIAGNOSIS — I480 Paroxysmal atrial fibrillation: Secondary | ICD-10-CM

## 2018-12-15 LAB — GLUCOSE, CAPILLARY
Glucose-Capillary: 122 mg/dL — ABNORMAL HIGH (ref 70–99)
Glucose-Capillary: 155 mg/dL — ABNORMAL HIGH (ref 70–99)
Glucose-Capillary: 145 mg/dL — ABNORMAL HIGH (ref 70–99)
Glucose-Capillary: 133 mg/dL — ABNORMAL HIGH (ref 70–99)

## 2018-12-15 LAB — CBC WITH DIFFERENTIAL/PLATELET
Abs Immature Granulocytes: 0.03 10*3/uL (ref 0.00–0.07)
Basophils Absolute: 0.1 10*3/uL (ref 0.0–0.1)
Basophils Relative: 1 %
Eosinophils Absolute: 0.6 10*3/uL — ABNORMAL HIGH (ref 0.0–0.5)
Eosinophils Relative: 8 %
HEMATOCRIT: 27.9 % — AB (ref 36.0–46.0)
Hemoglobin: 8.6 g/dL — ABNORMAL LOW (ref 12.0–15.0)
Immature Granulocytes: 0 %
Lymphocytes Relative: 31 %
Lymphs Abs: 2.4 10*3/uL (ref 0.7–4.0)
MCH: 28.2 pg (ref 26.0–34.0)
MCHC: 30.8 g/dL (ref 30.0–36.0)
MCV: 91.5 fL (ref 80.0–100.0)
MONO ABS: 0.8 10*3/uL (ref 0.1–1.0)
MONOS PCT: 11 %
Neutro Abs: 3.9 10*3/uL (ref 1.7–7.7)
Neutrophils Relative %: 49 %
Platelets: 310 10*3/uL (ref 150–400)
RBC: 3.05 MIL/uL — ABNORMAL LOW (ref 3.87–5.11)
RDW: 16 % — ABNORMAL HIGH (ref 11.5–15.5)
WBC: 7.8 10*3/uL (ref 4.0–10.5)
nRBC: 0 % (ref 0.0–0.2)

## 2018-12-15 LAB — CBC
HCT: 26.8 % — ABNORMAL LOW (ref 36.0–46.0)
Hemoglobin: 8.3 g/dL — ABNORMAL LOW (ref 12.0–15.0)
MCH: 28.4 pg (ref 26.0–34.0)
MCHC: 31 g/dL (ref 30.0–36.0)
MCV: 91.8 fL (ref 80.0–100.0)
Platelets: 294 10*3/uL (ref 150–400)
RBC: 2.92 MIL/uL — ABNORMAL LOW (ref 3.87–5.11)
RDW: 16 % — ABNORMAL HIGH (ref 11.5–15.5)
WBC: 6.9 10*3/uL (ref 4.0–10.5)
nRBC: 0 % (ref 0.0–0.2)

## 2018-12-15 LAB — GLUCOSE, POCT (MANUAL RESULT ENTRY): POC Glucose: 145 mg/dl — AB (ref 70–99)

## 2018-12-15 MED ORDER — PEG 3350-KCL-NA BICARB-NACL 420 G PO SOLR
4000.0000 mL | Freq: Once | ORAL | Status: DC
  Filled 2018-12-15: qty 4000

## 2018-12-15 MED ORDER — SODIUM CHLORIDE 0.9% FLUSH: 3.0000 mL | INTRAVENOUS | Status: DC | PRN

## 2018-12-15 MED ORDER — PEG 3350-KCL-NA BICARB-NACL 420 G PO SOLR
4000.0000 mL | Freq: Once | ORAL | Status: AC
  Administered 2018-12-15: 17:00:00 4000 mL via ORAL
  Filled 2018-12-15: qty 4000

## 2018-12-15 MED ORDER — SODIUM CHLORIDE 0.9% FLUSH
3.0000 mL | Freq: Two times a day (BID) | INTRAVENOUS | Status: DC
  Administered 2018-12-15 – 2018-12-16 (×3): 3 mL via INTRAVENOUS

## 2018-12-15 MED ORDER — SODIUM CHLORIDE 0.9 % IV SOLN
INTRAVENOUS | Status: AC
  Administered 2018-12-15 – 2018-12-16 (×3): via INTRAVENOUS

## 2018-12-15 NOTE — TOC Transition Note (Signed)
Transition of Care [facility]) - CM/SW Discharge Note   Patient Details  Name: Diane Mullins MRN: [id number] Date of Birth: 1932/10/14  Transition of Care [facility]) CM/SW Contact:  Zettie Pho, LCSW Phone Number: 12/15/2018, 9:22 AM   Clinical Narrative:  The CSW spoke with the patient about barriers to discharge to SNF (no bed offers and patient is in copay days, which she had mentioned during the assessment that she could not afford 20% copays). The patient stated that she would choose to return home with home health through [place]. The CSW advised the patient of the ability to add SW to her package to assist with possible SNF admission from the community should she show decline in mobility. The patient agreed. The patient declined EMS transport and shared that she would have family pick her up.  The CSW consulted with the RNCM who updated [facility] of the imminent discharge and orders for PT/OT/SW. The CSW updated the attending MD who plans to contact the patient's daughter to discuss discharge and disposition. The [facility] team will continue to follow pending discharge should any additional needs arise.     Final next level of care: [place] Barriers to Discharge: No Barriers Identified   Patient Goals and CMS Choice Patient states their goals for this hospitalization and ongoing recovery are:: "I want to be safe at home and be able to move around." CMS Medicare.gov Compare Post Acute Care list provided to:: Patient Choice offered to / list presented to : Patient  Discharge Placement              Patient chooses bed at: (N/A: Discharging home with home health) Patient to be transferred to facility by: (Family) Name of family member notified: [place] Daughter  Patient and family notified of of transfer: 12/15/18  Discharge Plan and Services In-house Referral: PCP / [facility], [facility] Discharge Planning Services: CM Consult Post Acute Care Choice: [place]          DME Arranged: N/A DME Agency: NA HH Arranged: NA [place] Agency: [facility] Interventions     Readmission Risk Interventions Readmission Risk Prevention Plan 12/14/2018 11/26/2018  Transportation Screening Complete Complete  PCP or Specialist Appt within 5-7 Days - Complete  Home Care Screening - Complete  Medication Review (RN CM) - Complete  Medication Review Press photographer) Not Complete -  Med Review Comments Review upon discharge -  PCP or Specialist appointment within 3-5 days of discharge Not Complete -  PCP/Specialist Appt Not Complete comments Review prior to discharge for appointment with cardiology -  [place] or Home Care Consult Complete -  SW Recovery Care/Counseling Consult Complete -  Palliative Care Screening Not Applicable -  Skilled Nursing Facility Complete -  Some recent data might be hidden

## 2018-12-15 NOTE — Progress Notes (Signed)
Progress Note  Patient Name: Diane Mullins Date of Encounter: 12/15/2018  Primary Cardiologist: Ida Rogue, MD   Subjective   No complaints, Sitting up in a recliner this AM Nursing notes reviewed, black BM this AM, old blood Maintaining NSR  Inpatient Medications    Scheduled Meds:  amiodarone  200 mg Oral Daily   insulin aspart  0-5 Units Subcutaneous QHS   insulin aspart  0-9 Units Subcutaneous TID WC   insulin glargine  10 Units Subcutaneous q morning - 10a   levothyroxine  75 mcg Oral Daily   pantoprazole  40 mg Intravenous Q12H   polyethylene glycol  17 g Oral Daily   simvastatin  20 mg Oral QPM   sodium chloride flush  3 mL Intravenous Q12H   Continuous Infusions:  sodium chloride 75 mL/hr at 12/15/18 1300   PRN Meds: acetaminophen **OR** acetaminophen, bisacodyl, ondansetron **OR** ondansetron (ZOFRAN) IV, senna-docusate, sodium chloride flush   Vital Signs    Vitals:   12/15/18 0009 12/15/18 0203 12/15/18 0354 12/15/18 0743  BP: 130/62 (!) 139/45 (!) 138/44 (!) 149/57  Pulse: 65 65 65 66  Resp:    20  Temp:    97.7 F (36.5 C)  TempSrc:    Oral  SpO2: 96% 96% 97% 98%  Weight:      Height:        Intake/Output Summary (Last 24 hours) at 12/15/2018 1348 Last data filed at 12/15/2018 1300 Gross per 24 hour  Intake 2155.1 ml  Output --  Net 2155.1 ml   Last 3 Weights 12/11/2018 12/11/2018 12/10/2018  Weight (lbs) 196 lb 1.6 oz 194 lb (No Data)  Weight (kg) 88.95 kg 87.998 kg (No Data)      Telemetry    NSR - Personally Reviewed  ECG     - Personally Reviewed  Physical Exam   GEN: No acute distress.  Appears pale. obese Neck: No JVD Cardiac: RRR, no murmurs, rubs, or gallops.  Respiratory: Clear to auscultation bilaterally. GI: Soft, nontender, non-distended  MS: No edema; No deformity. Neuro:  Nonfocal  Psych: Normal affect   Labs    Chemistry Recent Labs  Lab 12/10/18 1425 12/11/18 1805 12/13/18 1036  NA 140 139  139  K 4.4 4.3 4.0  CL 101 103 103  CO2 23 22 25   GLUCOSE 179* 157* 209*  BUN 24* 21 10  CREATININE 1.10 1.09* 1.04*  CALCIUM 8.9 8.8* 8.8*  PROT  --  6.7  --   ALBUMIN  --  3.7  --   AST  --  20  --   ALT  --  11  --   ALKPHOS  --  63  --   BILITOT  --  0.7  --   GFRNONAA  --  47* 49*  GFRAA  --  54* 57*  ANIONGAP  --  14 11     Hematology Recent Labs  Lab 12/14/18 0815 12/14/18 1154 12/15/18 0419  WBC 7.4 7.3 6.9  RBC 3.16* 3.26* 2.92*  HGB 8.9* 9.2* 8.3*  HCT 28.6* 30.0* 26.8*  MCV 90.5 92.0 91.8  MCH 28.2 28.2 28.4  MCHC 31.1 30.7 31.0  RDW 16.0* 16.2* 16.0*  PLT 306 312 294    Cardiac Enzymes Recent Labs  Lab 12/11/18 1805  TROPONINI <0.03   No results for input(s): TROPIPOC in the last 168 hours.   BNPNo results for input(s): BNP, PROBNP in the last 168 hours.   DDimer No results for  input(s): DDIMER in the last 168 hours.   Radiology    Ct Angio Head W Or Wo Contrast  Result Date: 12/14/2018 CLINICAL DATA:  Dysarthria.  Ataxia. EXAM: CT ANGIOGRAPHY HEAD AND NECK TECHNIQUE: Multidetector CT imaging of the head and neck was performed using the standard protocol during bolus administration of intravenous contrast. Multiplanar CT image reconstructions and MIPs were obtained to evaluate the vascular anatomy. Carotid stenosis measurements (when applicable) are obtained utilizing NASCET criteria, using the distal internal carotid diameter as the denominator. CONTRAST:  20mL OMNIPAQUE IOHEXOL 350 MG/ML SOLN COMPARISON:  Head MRA 09/20/2015 FINDINGS: CTA NECK FINDINGS Aortic arch: Standard 3 vessel aortic arch with mild atherosclerotic plaque. Widely patent arch vessel origins. Right carotid system: Patent with mild calcified plaque about the carotid bifurcation. No evidence of dissection or significant stenosis. Retropharyngeal course of the distal common and proximal internal carotid arteries. Tortuous distal cervical ICA. Left carotid system: Patent with mild  calcified plaque about the carotid bifurcation. No evidence of dissection or significant stenosis. Retropharyngeal course of the distal common carotid artery. Vertebral arteries: Patent without evidence of stenosis or dissection. Moderately dominant left vertebral artery. Skeleton: Advanced cervical facet arthrosis. Moderate disc degeneration at C6-7. Other neck: No mass or enlarged lymph nodes. Upper chest: Clear lung apices. Review of the MIP images confirms the above findings CTA HEAD FINDINGS Anterior circulation: The internal carotid arteries are patent from skull base to carotid termini with nonstenotic plaque bilaterally. ACAs and MCAs are patent without evidence of proximal branch occlusion or significant proximal stenosis. No aneurysm is identified. Posterior circulation: The intracranial vertebral arteries are widely patent to the basilar. Patent right PICA and bilateral SCAs are present. A left PICA and AICAs are not clearly identified. The basilar artery is widely patent. There is a fetal origin of the left PCA. A moderate right P1 stenosis is noted. No aneurysm is identified. Venous sinuses: Patent. Anatomic variants: Fetal left PCA. Delayed phase: No abnormal enhancement. Review of the MIP images confirms the above findings IMPRESSION: 1. No large vessel occlusion. 2. Mild cervical carotid artery atherosclerosis without stenosis. 3. Widely patent vertebral arteries. 4. Moderate right P1 stenosis. 5.  Aortic Atherosclerosis (ICD10-I70.0). Electronically Signed   By: Logan Bores M.D.   On: 12/14/2018 21:03   Ct Head Wo Contrast  Result Date: 12/14/2018 CLINICAL DATA:  Slurred speech.  History of stroke. EXAM: CT HEAD WITHOUT CONTRAST TECHNIQUE: Contiguous axial images were obtained from the base of the skull through the vertex without intravenous contrast. COMPARISON:  March 30, 2016 FINDINGS: Brain: No subdural, epidural, or subarachnoid hemorrhage. Cerebellum and brainstem are normal. Basal  cisterns are patent ventricles and sulci are prominent. White matter changes are identified. No acute cortical ischemia or infarct identified. No mass effect or midline shift. Vascular: No hyperdense vessel or unexpected calcification. Skull: Normal. Negative for fracture or focal lesion. Sinuses/Orbits: No acute finding. Other: None. IMPRESSION: 1. No acute intracranial abnormalities. Electronically Signed   By: Dorise Bullion III M.D   On: 12/14/2018 18:50   Ct Angio Neck W Or Wo Contrast  Result Date: 12/14/2018 CLINICAL DATA:  Dysarthria.  Ataxia. EXAM: CT ANGIOGRAPHY HEAD AND NECK TECHNIQUE: Multidetector CT imaging of the head and neck was performed using the standard protocol during bolus administration of intravenous contrast. Multiplanar CT image reconstructions and MIPs were obtained to evaluate the vascular anatomy. Carotid stenosis measurements (when applicable) are obtained utilizing NASCET criteria, using the distal internal carotid diameter as the denominator. CONTRAST:  57mL OMNIPAQUE IOHEXOL 350 MG/ML SOLN COMPARISON:  Head MRA 09/20/2015 FINDINGS: CTA NECK FINDINGS Aortic arch: Standard 3 vessel aortic arch with mild atherosclerotic plaque. Widely patent arch vessel origins. Right carotid system: Patent with mild calcified plaque about the carotid bifurcation. No evidence of dissection or significant stenosis. Retropharyngeal course of the distal common and proximal internal carotid arteries. Tortuous distal cervical ICA. Left carotid system: Patent with mild calcified plaque about the carotid bifurcation. No evidence of dissection or significant stenosis. Retropharyngeal course of the distal common carotid artery. Vertebral arteries: Patent without evidence of stenosis or dissection. Moderately dominant left vertebral artery. Skeleton: Advanced cervical facet arthrosis. Moderate disc degeneration at C6-7. Other neck: No mass or enlarged lymph nodes. Upper chest: Clear lung apices. Review of  the MIP images confirms the above findings CTA HEAD FINDINGS Anterior circulation: The internal carotid arteries are patent from skull base to carotid termini with nonstenotic plaque bilaterally. ACAs and MCAs are patent without evidence of proximal branch occlusion or significant proximal stenosis. No aneurysm is identified. Posterior circulation: The intracranial vertebral arteries are widely patent to the basilar. Patent right PICA and bilateral SCAs are present. A left PICA and AICAs are not clearly identified. The basilar artery is widely patent. There is a fetal origin of the left PCA. A moderate right P1 stenosis is noted. No aneurysm is identified. Venous sinuses: Patent. Anatomic variants: Fetal left PCA. Delayed phase: No abnormal enhancement. Review of the MIP images confirms the above findings IMPRESSION: 1. No large vessel occlusion. 2. Mild cervical carotid artery atherosclerosis without stenosis. 3. Widely patent vertebral arteries. 4. Moderate right P1 stenosis. 5.  Aortic Atherosclerosis (ICD10-I70.0). Electronically Signed   By: Logan Bores M.D.   On: 12/14/2018 21:03   Mr Brain Wo Contrast  Result Date: 12/14/2018 CLINICAL DATA:  Dysarthria.  Ataxia. EXAM: MRI HEAD WITHOUT CONTRAST TECHNIQUE: Multiplanar, multiecho pulse sequences of the brain and surrounding structures were obtained without intravenous contrast. COMPARISON:  Head CT 12/06/2018 and MRI 10/22/2018 FINDINGS: The study is mildly motion degraded. Brain: There is no evidence of acute infarct, intracranial hemorrhage, mass, midline shift, or extra-axial fluid collection. There is mild cerebral atrophy. Patchy T2 hyperintensities in the cerebral white matter are similar to the prior MRI and nonspecific but likely reflect moderate chronic small vessel ischemia given patient's vascular risk factors. A tiny chronic left parietal cortical infarct is unchanged. Vascular: Major intracranial vascular flow voids are preserved. Skull and  upper cervical spine: Unremarkable bone marrow signal. Sinuses/Orbits: Unremarkable orbits. Clear paranasal sinuses. Trace bilateral mastoid fluid. Other: None. IMPRESSION: 1. No acute intracranial abnormality. 2. Moderate chronic small vessel ischemic disease. Electronically Signed   By: Logan Bores M.D.   On: 12/14/2018 21:08    Cardiac Studies   TEE 10/22/2018 - Limited study to exclude intraatrial thrombus. - Mitral valve: There was mild to moderate regurgitation directed   eccentrically. - Left atrium: No evidence of thrombus in the atrial cavity or   appendage. No evidence of thrombus in the atrial cavity or   appendage. - Right atrium: No evidence of thrombus in the atrial cavity or   appendage. - Atrial septum: Doppler showed no atrial level shunt  Patient Profile     RIVA SESMA is a 85 y.o. female with a hx of atrial flutter with rapid ventricular response s/p TEE/DCCV (10/22/2018 and again November 26, 2018), chronic diastolic heart failure, type 2 diabetes, hypertension, hyperlipidemia previous CVA/TIA, h/o anemia, COPD, and vertigo, hospitalized November 25, 2022  atrial flutter with rapid ventricular response requiring diuresis and cardioversion  on Eliquis, presenting by referral from primary care to go to the emergency room with dizziness, near syncope past week or 2 noted to have anemia hemoglobin 8.6  Assessment & Plan    Anemia/GI bleed ER notes reporting melena and concern for GI bleed Nursing note today with black BM consistent with prior bleed Prior hemoglobin 10.6 several weeks ago Down to 7.8 on arrival to the emergency room December 11, 2018 Hemoglobin now 8  EGD yesterday unrevealing -Eliquis has been held For further drop might need colonoscopy (GI following)  Atrial flutter with rapid ventricular response 2 cardioversions in the past 3 months Eliquis has been held Most recent cardioversion November 26, 2018.  Given elevated CHADSVASC would ideally like to keep her on  Eliquis but given GI bleed this has been held (-CHA2DS2VASc score of at least 8 (CHF, HTN, agex2, DM2, stroke x2, female).) --Currently on amiodarone 200 mg daily, continue metoprolol  Acute on chronic HFpEF Prior history of heart failure in the setting of atrial flutter with RVR On Lasix 20 daily -Would restart Lasix 20 once diet is resumed  HTN - Currently controlled.  Would restart metoprolol losartan, lasix once tolerating diet  DM2 - SSI, Per IM  Anemia Acute on chronic HBG 8   Total encounter time more than 25 minutes  Greater than 50% was spent in counseling and coordination of care with the patient  [facility] will sign off.   Medication Recommendations:  Continue amiodarone, metoprolol, holding eliquis in the setting of GI bleed. Ideally would like to restart in 2 weeks Other recommendations (labs, testing, etc):  No further testing Follow up as an outpatient:  Web visit in 2 weeks, office will arrange  For questions or updates, please contact [place] Please consult [email or web address] for contact info under        Signed, Ida Rogue, MD  12/15/2018, 1:48 PM

## 2018-12-15 NOTE — Progress Notes (Signed)
Physical Therapy Treatment Patient Details Name: Diane Mullins MRN: [id number] DOB: May 14, 1933 Today's Date: 12/15/2018    History of Present Illness 85 y/o female admitted to hospital after syncopal episode. PMH significant for atrial flutter, CHF, anemia, constipation, and lightheadedness.     PT Comments    Pt up to chair upon entry. Agreeable to treatment session. Pt participates in transfers training and gait training with emphasis on balance. Pt notes no dizziness during session, albeit she mentions dizziness earlier this date. Pt has 1 LOB with retroAMB in room, requires mindA for recovery. AMB distances still limited to less than 89ft.   Follow Up Recommendations  SNF     Equipment Recommendations       Recommendations for Other Services       Precautions / Restrictions Precautions Precautions: Fall Precaution Comments: *H&H  Restrictions Weight Bearing Restrictions: No    Mobility  Bed Mobility               General bed mobility comments: Patient sitting up in chair on arrival.  Transfers Overall transfer level: Needs assistance Equipment used: Rolling walker (2 wheeled) Transfers: Sit to/from Stand Sit to Stand: Supervision         General transfer comment: VC to alignbody with sagittal axis of chair. performed 7x in session, no physical asssit.   Ambulation/Gait Ambulation/Gait assistance: Min assist;Min guard Gait Distance (Feet): 48 Feet Assistive device: Rolling walker (2 wheeled)       General Gait Details: 2 bouts alternating fwd and retro AMB (to target balance training) withRW, 1 LOB withretro AMB during 2nd bout, requires minA for falls recovery. Pt denies giddiness, but reports feeling tired.    Stairs             Wheelchair Mobility    Modified Rankin (Stroke Patients Only)       Balance Overall balance assessment: Needs assistance Sitting-balance support: Bilateral upper extremity supported;Feet supported Sitting  balance-Leahy Scale: Good     Standing balance support: Bilateral upper extremity supported;During functional activity Standing balance-Leahy Scale: Poor                              Cognition Arousal/Alertness: Awake/alert Behavior During Therapy: WFL for tasks assessed/performed Overall Cognitive Status: Within Functional Limits for tasks assessed                                        Exercises      General Comments        Pertinent Vitals/Pain Pain Assessment: No/denies pain    Home Living                      Prior Function            PT Goals (current goals can now be found in the care plan section) Acute Rehab PT Goals Patient Stated Goal: return to [facility]) PT Goal Formulation: With patient Time For Goal Achievement: 12/27/18 Potential to Achieve Goals: Fair Progress towards PT goals: Progressing toward goals    Frequency    Min 2X/week      PT Plan Current plan remains appropriate    Co-evaluation              AM-PAC PT "6 Clicks" Mobility   Outcome Measure  Help needed turning from  your back to your side while in a flat bed without using bedrails?: None Help needed moving from lying on your back to sitting on the side of a flat bed without using bedrails?: A Little Help needed moving to and from a bed to a chair (including a wheelchair)?: A Little Help needed standing up from a chair using your arms (e.g., wheelchair or bedside chair)?: A Little Help needed to walk in hospital room?: A Little Help needed climbing 3-5 steps with a railing? : A Lot 6 Click Score: 18    End of Session   Activity Tolerance: Patient limited by fatigue;Patient tolerated treatment well Patient left: in chair;with chair alarm set;with call bell/phone within reach Nurse Communication: Mobility status(H&H) PT Visit Diagnosis: Unsteadiness on feet (R26.81);Difficulty in walking, not elsewhere classified  (R26.2);History of falling (Z91.81);Muscle weakness (generalized) (M62.81)     Time: 6812-7517 PT Time Calculation (min) (ACUTE ONLY): 15 min  Charges:  $Therapeutic Exercise: 8-22 mins                     3:25 PM, 12/15/18 Etta Grandchild, PT, DPT Physical Therapist - [facility]  [phone] ([place])     Tangee Marszalek C 12/15/2018, 3:23 PM

## 2018-12-15 NOTE — Progress Notes (Signed)
[facility] NAME: Diane Mullins    MR#:  [id number]  DATE OF BIRTH:  [birth year]  SUBJECTIVE:  Patient seen earlier this am and after being seen she had large dark melanotic bowel movement.  REVIEW OF SYSTEMS:    Review of Systems  Constitutional: Negative for fever, chills weight loss HENT: Negative for ear pain, nosebleeds, congestion, facial swelling, rhinorrhea, neck pain, neck stiffness and ear discharge.   Respiratory: Negative for cough, shortness of breath, wheezing  Cardiovascular: Negative for chest pain, palpitations and leg swelling.  Gastrointestinal: Negative for heartburn, abdominal pain, vomiting, diarrhea or consitpation + MELENA Genitourinary: Negative for dysuria, urgency, frequency, hematuria Musculoskeletal: Negative for back pain or joint pain Neurological: Negative for dizziness, seizures, syncope, focal weakness,  numbness and headaches.  Hematological: Does not bruise/bleed easily.  Psychiatric/Behavioral: Negative for hallucinations, confusion, dysphoric mood    Tolerating Diet: yes      DRUG ALLERGIES:   Allergies  Allergen Reactions  . Oxycodone     Other reaction(s): Confusion Patient's daughter reported confusion/sensitivity to oxycodone.     VITALS:  Blood pressure (!) 149/57, pulse 66, temperature 97.7 F (36.5 C), temperature source Oral, resp. rate 20, height 5\' 2"  (1.575 m), weight 89 kg, SpO2 98 %.  PHYSICAL EXAMINATION:  Constitutional: Appears well-developed and well-nourished. No distress. HENT: Normocephalic. Marland Kitchen Oropharynx is clear and moist.  Eyes: Conjunctivae and EOM are normal. PERRLA, no scleral icterus.  Neck: Normal ROM. Neck supple. No JVD. No tracheal deviation. CVS: RRR, S1/S2 +, no murmurs, no gallops, no carotid bruit.  Pulmonary: Effort and breath sounds normal, no stridor, rhonchi, wheezes, rales.  Abdominal: Soft. BS +,  no distension, tenderness, rebound or  guarding.  Musculoskeletal: Normal range of motion. No edema and no tenderness.  Neuro: Alert. CN 2-12 grossly intact. No focal deficits. Skin: Skin is warm and dry. No rash noted. Psychiatric: Normal mood and affect.      LABORATORY PANEL:   CBC Recent Labs  Lab 12/15/18 0419  WBC 6.9  HGB 8.3*  HCT 26.8*  PLT 294   ------------------------------------------------------------------------------------------------------------------  Chemistries  Recent Labs  Lab 12/11/18 1805 12/13/18 0455 12/13/18 1036  NA 139  --  139  K 4.3  --  4.0  CL 103  --  103  CO2 22  --  25  GLUCOSE 157*  --  209*  BUN 21  --  10  CREATININE 1.09*  --  1.04*  CALCIUM 8.8*  --  8.8*  MG 1.6* 1.9  --   AST 20  --   --   ALT 11  --   --   ALKPHOS 63  --   --   BILITOT 0.7  --   --    ------------------------------------------------------------------------------------------------------------------  Cardiac Enzymes Recent Labs  Lab 12/11/18 1805  TROPONINI <0.03   ------------------------------------------------------------------------------------------------------------------  RADIOLOGY:  Ct Angio Head W Or Wo Contrast  Result Date: 12/14/2018 CLINICAL DATA:  Dysarthria.  Ataxia. EXAM: CT ANGIOGRAPHY HEAD AND NECK TECHNIQUE: Multidetector CT imaging of the head and neck was performed using the standard protocol during bolus administration of intravenous contrast. Multiplanar CT image reconstructions and MIPs were obtained to evaluate the vascular anatomy. Carotid stenosis measurements (when applicable) are obtained utilizing NASCET criteria, using the distal internal carotid diameter as the denominator. CONTRAST:  24mL OMNIPAQUE IOHEXOL 350 MG/ML SOLN COMPARISON:  Head MRA 09/20/2015 FINDINGS: CTA NECK FINDINGS Aortic arch: Standard 3 vessel aortic arch with  mild atherosclerotic plaque. Widely patent arch vessel origins. Right carotid system: Patent with mild calcified plaque about the  carotid bifurcation. No evidence of dissection or significant stenosis. Retropharyngeal course of the distal common and proximal internal carotid arteries. Tortuous distal cervical ICA. Left carotid system: Patent with mild calcified plaque about the carotid bifurcation. No evidence of dissection or significant stenosis. Retropharyngeal course of the distal common carotid artery. Vertebral arteries: Patent without evidence of stenosis or dissection. Moderately dominant left vertebral artery. Skeleton: Advanced cervical facet arthrosis. Moderate disc degeneration at C6-7. Other neck: No mass or enlarged lymph nodes. Upper chest: Clear lung apices. Review of the MIP images confirms the above findings CTA HEAD FINDINGS Anterior circulation: The internal carotid arteries are patent from skull base to carotid termini with nonstenotic plaque bilaterally. ACAs and MCAs are patent without evidence of proximal branch occlusion or significant proximal stenosis. No aneurysm is identified. Posterior circulation: The intracranial vertebral arteries are widely patent to the basilar. Patent right PICA and bilateral SCAs are present. A left PICA and AICAs are not clearly identified. The basilar artery is widely patent. There is a fetal origin of the left PCA. A moderate right P1 stenosis is noted. No aneurysm is identified. Venous sinuses: Patent. Anatomic variants: Fetal left PCA. Delayed phase: No abnormal enhancement. Review of the MIP images confirms the above findings IMPRESSION: 1. No large vessel occlusion. 2. Mild cervical carotid artery atherosclerosis without stenosis. 3. Widely patent vertebral arteries. 4. Moderate right P1 stenosis. 5.  Aortic Atherosclerosis (ICD10-I70.0). Electronically Signed   By: Logan Bores M.D.   On: 12/14/2018 21:03   Ct Head Wo Contrast  Result Date: 12/14/2018 CLINICAL DATA:  Slurred speech.  History of stroke. EXAM: CT HEAD WITHOUT CONTRAST TECHNIQUE: Contiguous axial images were  obtained from the base of the skull through the vertex without intravenous contrast. COMPARISON:  March 30, 2016 FINDINGS: Brain: No subdural, epidural, or subarachnoid hemorrhage. Cerebellum and brainstem are normal. Basal cisterns are patent ventricles and sulci are prominent. White matter changes are identified. No acute cortical ischemia or infarct identified. No mass effect or midline shift. Vascular: No hyperdense vessel or unexpected calcification. Skull: Normal. Negative for fracture or focal lesion. Sinuses/Orbits: No acute finding. Other: None. IMPRESSION: 1. No acute intracranial abnormalities. Electronically Signed   By: Dorise Bullion III M.D   On: 12/14/2018 18:50   Ct Angio Neck W Or Wo Contrast  Result Date: 12/14/2018 CLINICAL DATA:  Dysarthria.  Ataxia. EXAM: CT ANGIOGRAPHY HEAD AND NECK TECHNIQUE: Multidetector CT imaging of the head and neck was performed using the standard protocol during bolus administration of intravenous contrast. Multiplanar CT image reconstructions and MIPs were obtained to evaluate the vascular anatomy. Carotid stenosis measurements (when applicable) are obtained utilizing NASCET criteria, using the distal internal carotid diameter as the denominator. CONTRAST:  36mL OMNIPAQUE IOHEXOL 350 MG/ML SOLN COMPARISON:  Head MRA 09/20/2015 FINDINGS: CTA NECK FINDINGS Aortic arch: Standard 3 vessel aortic arch with mild atherosclerotic plaque. Widely patent arch vessel origins. Right carotid system: Patent with mild calcified plaque about the carotid bifurcation. No evidence of dissection or significant stenosis. Retropharyngeal course of the distal common and proximal internal carotid arteries. Tortuous distal cervical ICA. Left carotid system: Patent with mild calcified plaque about the carotid bifurcation. No evidence of dissection or significant stenosis. Retropharyngeal course of the distal common carotid artery. Vertebral arteries: Patent without evidence of stenosis or  dissection. Moderately dominant left vertebral artery. Skeleton: Advanced cervical facet arthrosis. Moderate disc  degeneration at C6-7. Other neck: No mass or enlarged lymph nodes. Upper chest: Clear lung apices. Review of the MIP images confirms the above findings CTA HEAD FINDINGS Anterior circulation: The internal carotid arteries are patent from skull base to carotid termini with nonstenotic plaque bilaterally. ACAs and MCAs are patent without evidence of proximal branch occlusion or significant proximal stenosis. No aneurysm is identified. Posterior circulation: The intracranial vertebral arteries are widely patent to the basilar. Patent right PICA and bilateral SCAs are present. A left PICA and AICAs are not clearly identified. The basilar artery is widely patent. There is a fetal origin of the left PCA. A moderate right P1 stenosis is noted. No aneurysm is identified. Venous sinuses: Patent. Anatomic variants: Fetal left PCA. Delayed phase: No abnormal enhancement. Review of the MIP images confirms the above findings IMPRESSION: 1. No large vessel occlusion. 2. Mild cervical carotid artery atherosclerosis without stenosis. 3. Widely patent vertebral arteries. 4. Moderate right P1 stenosis. 5.  Aortic Atherosclerosis (ICD10-I70.0). Electronically Signed   By: Logan Bores M.D.   On: 12/14/2018 21:03   Mr Brain Wo Contrast  Result Date: 12/14/2018 CLINICAL DATA:  Dysarthria.  Ataxia. EXAM: MRI HEAD WITHOUT CONTRAST TECHNIQUE: Multiplanar, multiecho pulse sequences of the brain and surrounding structures were obtained without intravenous contrast. COMPARISON:  Head CT 12/06/2018 and MRI 10/22/2018 FINDINGS: The study is mildly motion degraded. Brain: There is no evidence of acute infarct, intracranial hemorrhage, mass, midline shift, or extra-axial fluid collection. There is mild cerebral atrophy. Patchy T2 hyperintensities in the cerebral white matter are similar to the prior MRI and nonspecific but likely  reflect moderate chronic small vessel ischemia given patient's vascular risk factors. A tiny chronic left parietal cortical infarct is unchanged. Vascular: Major intracranial vascular flow voids are preserved. Skull and upper cervical spine: Unremarkable bone marrow signal. Sinuses/Orbits: Unremarkable orbits. Clear paranasal sinuses. Trace bilateral mastoid fluid. Other: None. IMPRESSION: 1. No acute intracranial abnormality. 2. Moderate chronic small vessel ischemic disease. Electronically Signed   By: Logan Bores M.D.   On: 12/14/2018 21:08     ASSESSMENT AND PLAN:   85 year old female with a history of PAF on amiodarone and Eliquis who presented to the hospital due to melena.  1.  Melena: She underwent EGD 3/28 which showslarge Hiatal hernoa but normal otherwise. Due to slight drop in hemoglobin (not requiring blood transfusion at this point) and large melanotic stool this morning plan for colonoscopy tomorrow.  May repeat EGD in a.m. as well per my conversation with GI.    Follow hemoglobin   2.  PAF: Hold aspirin. Cardiology recommending not to continue Eliquis. Continue amiodarone. Heart rate was low and therefore metoprolol was discontinued.   3. Hypothyroid:Continue Synthroid  4. Diabetes: Continue ADA diet and SSI.  Management plans discussed with the patient and she is in agreement.  CODE STATUS: full  TOTAL TIME TAKING CARE OF THIS PATIENT: 28 minutes.   D/w daughter  POSSIBLE D/C 2-3 days DEPENDING ON CLINICAL CONDITION.   Bettey Costa M.D on 12/15/2018 at 10:49 AM  Between 7am to 6pm - Pager - [phone] After 6pm go to [email or web address] - password [email or web address] [facility]  Office  [phone]  CC: Primary care physician; [name], MD  Note: This dictation was prepared with Dragon dictation along with smaller phrase technology. Any transcriptional errors that result from this process are unintentional.

## 2018-12-15 NOTE — Progress Notes (Signed)
Lucilla Lame, MD [facility]   [address], [place],  [address] Phone: [phone] Fax : [phone]   Subjective: The patient had a very large bowel movement that was reported to be black and soft this morning.  This may have represented old blood but the patient's hemoglobin did drop slightly.  The patient was due to go home today but was questioning whether she should and whether colonoscopy was in order with the patient having a strong family history of colon cancer.   Objective: Vital signs in last 24 hours: Vitals:   12/15/18 0009 12/15/18 0203 12/15/18 0354 12/15/18 0743  BP: 130/62 (!) 139/45 (!) 138/44 (!) 149/57  Pulse: 65 65 65 66  Resp:    20  Temp:    97.7 F (36.5 C)  TempSrc:    Oral  SpO2: 96% 96% 97% 98%  Weight:      Height:       Weight change:   Intake/Output Summary (Last 24 hours) at 12/15/2018 1438 Last data filed at 12/15/2018 1300 Gross per 24 hour  Intake 2035.1 ml  Output -  Net 2035.1 ml     Exam: Heart:: Regular rate and rhythm, S1S2 present or without murmur or extra heart sounds Lungs: normal and clear to auscultation and percussion Abdomen: soft, nontender, normal bowel sounds   Lab Results: @LABTEST2 @ Micro Results: No results found for this or any previous visit (from the past 240 hour(s)). Studies/Results: Ct Angio Head W Or Wo Contrast  Result Date: 12/14/2018 CLINICAL DATA:  Dysarthria.  Ataxia. EXAM: CT ANGIOGRAPHY HEAD AND NECK TECHNIQUE: Multidetector CT imaging of the head and neck was performed using the standard protocol during bolus administration of intravenous contrast. Multiplanar CT image reconstructions and MIPs were obtained to evaluate the vascular anatomy. Carotid stenosis measurements (when applicable) are obtained utilizing NASCET criteria, using the distal internal carotid diameter as the denominator. CONTRAST:  36mL OMNIPAQUE IOHEXOL 350 MG/ML SOLN COMPARISON:  Head MRA 09/20/2015 FINDINGS: CTA NECK  FINDINGS Aortic arch: Standard 3 vessel aortic arch with mild atherosclerotic plaque. Widely patent arch vessel origins. Right carotid system: Patent with mild calcified plaque about the carotid bifurcation. No evidence of dissection or significant stenosis. Retropharyngeal course of the distal common and proximal internal carotid arteries. Tortuous distal cervical ICA. Left carotid system: Patent with mild calcified plaque about the carotid bifurcation. No evidence of dissection or significant stenosis. Retropharyngeal course of the distal common carotid artery. Vertebral arteries: Patent without evidence of stenosis or dissection. Moderately dominant left vertebral artery. Skeleton: Advanced cervical facet arthrosis. Moderate disc degeneration at C6-7. Other neck: No mass or enlarged lymph nodes. Upper chest: Clear lung apices. Review of the MIP images confirms the above findings CTA HEAD FINDINGS Anterior circulation: The internal carotid arteries are patent from skull base to carotid termini with nonstenotic plaque bilaterally. ACAs and MCAs are patent without evidence of proximal branch occlusion or significant proximal stenosis. No aneurysm is identified. Posterior circulation: The intracranial vertebral arteries are widely patent to the basilar. Patent right PICA and bilateral SCAs are present. A left PICA and AICAs are not clearly identified. The basilar artery is widely patent. There is a fetal origin of the left PCA. A moderate right P1 stenosis is noted. No aneurysm is identified. Venous sinuses: Patent. Anatomic variants: Fetal left PCA. Delayed phase: No abnormal enhancement. Review of the MIP images confirms the above findings IMPRESSION: 1. No large vessel occlusion. 2. Mild cervical carotid artery atherosclerosis without stenosis. 3. Widely  patent vertebral arteries. 4. Moderate right P1 stenosis. 5.  Aortic Atherosclerosis (ICD10-I70.0). Electronically Signed   By: Logan Bores M.D.   On: 12/14/2018  21:03   Ct Head Wo Contrast  Result Date: 12/14/2018 CLINICAL DATA:  Slurred speech.  History of stroke. EXAM: CT HEAD WITHOUT CONTRAST TECHNIQUE: Contiguous axial images were obtained from the base of the skull through the vertex without intravenous contrast. COMPARISON:  March 30, 2016 FINDINGS: Brain: No subdural, epidural, or subarachnoid hemorrhage. Cerebellum and brainstem are normal. Basal cisterns are patent ventricles and sulci are prominent. White matter changes are identified. No acute cortical ischemia or infarct identified. No mass effect or midline shift. Vascular: No hyperdense vessel or unexpected calcification. Skull: Normal. Negative for fracture or focal lesion. Sinuses/Orbits: No acute finding. Other: None. IMPRESSION: 1. No acute intracranial abnormalities. Electronically Signed   By: Dorise Bullion III M.D   On: 12/14/2018 18:50   Ct Angio Neck W Or Wo Contrast  Result Date: 12/14/2018 CLINICAL DATA:  Dysarthria.  Ataxia. EXAM: CT ANGIOGRAPHY HEAD AND NECK TECHNIQUE: Multidetector CT imaging of the head and neck was performed using the standard protocol during bolus administration of intravenous contrast. Multiplanar CT image reconstructions and MIPs were obtained to evaluate the vascular anatomy. Carotid stenosis measurements (when applicable) are obtained utilizing NASCET criteria, using the distal internal carotid diameter as the denominator. CONTRAST:  31mL OMNIPAQUE IOHEXOL 350 MG/ML SOLN COMPARISON:  Head MRA 09/20/2015 FINDINGS: CTA NECK FINDINGS Aortic arch: Standard 3 vessel aortic arch with mild atherosclerotic plaque. Widely patent arch vessel origins. Right carotid system: Patent with mild calcified plaque about the carotid bifurcation. No evidence of dissection or significant stenosis. Retropharyngeal course of the distal common and proximal internal carotid arteries. Tortuous distal cervical ICA. Left carotid system: Patent with mild calcified plaque about the carotid  bifurcation. No evidence of dissection or significant stenosis. Retropharyngeal course of the distal common carotid artery. Vertebral arteries: Patent without evidence of stenosis or dissection. Moderately dominant left vertebral artery. Skeleton: Advanced cervical facet arthrosis. Moderate disc degeneration at C6-7. Other neck: No mass or enlarged lymph nodes. Upper chest: Clear lung apices. Review of the MIP images confirms the above findings CTA HEAD FINDINGS Anterior circulation: The internal carotid arteries are patent from skull base to carotid termini with nonstenotic plaque bilaterally. ACAs and MCAs are patent without evidence of proximal branch occlusion or significant proximal stenosis. No aneurysm is identified. Posterior circulation: The intracranial vertebral arteries are widely patent to the basilar. Patent right PICA and bilateral SCAs are present. A left PICA and AICAs are not clearly identified. The basilar artery is widely patent. There is a fetal origin of the left PCA. A moderate right P1 stenosis is noted. No aneurysm is identified. Venous sinuses: Patent. Anatomic variants: Fetal left PCA. Delayed phase: No abnormal enhancement. Review of the MIP images confirms the above findings IMPRESSION: 1. No large vessel occlusion. 2. Mild cervical carotid artery atherosclerosis without stenosis. 3. Widely patent vertebral arteries. 4. Moderate right P1 stenosis. 5.  Aortic Atherosclerosis (ICD10-I70.0). Electronically Signed   By: Logan Bores M.D.   On: 12/14/2018 21:03   Mr Brain Wo Contrast  Result Date: 12/14/2018 CLINICAL DATA:  Dysarthria.  Ataxia. EXAM: MRI HEAD WITHOUT CONTRAST TECHNIQUE: Multiplanar, multiecho pulse sequences of the brain and surrounding structures were obtained without intravenous contrast. COMPARISON:  Head CT 12/06/2018 and MRI 10/22/2018 FINDINGS: The study is mildly motion degraded. Brain: There is no evidence of acute infarct, intracranial hemorrhage, mass, midline  shift, or extra-axial fluid collection. There is mild cerebral atrophy. Patchy T2 hyperintensities in the cerebral white matter are similar to the prior MRI and nonspecific but likely reflect moderate chronic small vessel ischemia given patient's vascular risk factors. A tiny chronic left parietal cortical infarct is unchanged. Vascular: Major intracranial vascular flow voids are preserved. Skull and upper cervical spine: Unremarkable bone marrow signal. Sinuses/Orbits: Unremarkable orbits. Clear paranasal sinuses. Trace bilateral mastoid fluid. Other: None. IMPRESSION: 1. No acute intracranial abnormality. 2. Moderate chronic small vessel ischemic disease. Electronically Signed   By: Logan Bores M.D.   On: 12/14/2018 21:08   Medications: I have reviewed the patient's current medications. Scheduled Meds: . amiodarone  200 mg Oral Daily  . insulin aspart  0-5 Units Subcutaneous QHS  . insulin aspart  0-9 Units Subcutaneous TID WC  . insulin glargine  10 Units Subcutaneous q morning - 10a  . levothyroxine  75 mcg Oral Daily  . pantoprazole  40 mg Intravenous Q12H  . polyethylene glycol  17 g Oral Daily  . simvastatin  20 mg Oral QPM  . sodium chloride flush  3 mL Intravenous Q12H   Continuous Infusions: . sodium chloride 75 mL/hr at 12/15/18 1300   PRN Meds:.acetaminophen **OR** acetaminophen, bisacodyl, ondansetron **OR** ondansetron (ZOFRAN) IV, senna-docusate, sodium chloride flush   Assessment: Active Problems:   Melena   Symptomatic anemia    Plan: The patient had a large melanotic stool this morning which may represent old blood or an active bleed.  The patient's hemoglobin went from 9.2 yesterday to 8.3 today.  Her upper endoscopy did not show any sign of active bleeding or old blood in the stomach.  I will recommend that the patient take a prep today for a colonoscopy tomorrow and also since the stools were black I would also recommend the patient undergo a push enteroscopy.  The  patient has been explained the plan and agrees with it.   LOS: 4 days   Lucilla Lame 12/15/2018, 2:38 PM

## 2018-12-15 NOTE — Progress Notes (Signed)
Hgb down to 8.3 this a.m. Stroke workup negative with stroke care completed. Pt had large black, old bloody stool this morning in [facility] with MD notifed.  Color pale. Diet changed to clear liquids/IVF"s restarted. Up in chair x 2 hours and tolerated well. Denis nausea/vomiting.

## 2018-12-16 ENCOUNTER — Inpatient Hospital Stay: Payer: PPO | Admitting: Anesthesiology

## 2018-12-16 ENCOUNTER — Encounter: Payer: Self-pay | Admitting: *Deleted

## 2018-12-16 ENCOUNTER — Telehealth: Payer: Self-pay | Admitting: Cardiovascular Disease

## 2018-12-16 ENCOUNTER — Encounter
Admission: EM | Disposition: A | Discharge status: Skilled Nursing Facility | Payer: Self-pay | Source: Home / Self Care | Attending: Internal Medicine

## 2018-12-16 ENCOUNTER — Telehealth: Payer: Self-pay

## 2018-12-16 ENCOUNTER — Ambulatory Visit: Payer: PPO | Admitting: Cardiovascular Disease

## 2018-12-16 DIAGNOSIS — K5790 Diverticulosis of intestine, part unspecified, without perforation or abscess without bleeding: Secondary | ICD-10-CM | POA: Diagnosis not present

## 2018-12-16 DIAGNOSIS — K648 Other hemorrhoids: Secondary | ICD-10-CM | POA: Diagnosis not present

## 2018-12-16 DIAGNOSIS — K635 Polyp of colon: Secondary | ICD-10-CM | POA: Diagnosis not present

## 2018-12-16 DIAGNOSIS — K644 Residual hemorrhoidal skin tags: Secondary | ICD-10-CM

## 2018-12-16 DIAGNOSIS — K5730 Diverticulosis of large intestine without perforation or abscess without bleeding: Secondary | ICD-10-CM

## 2018-12-16 HISTORY — PX: ENTEROSCOPY: SHX5533

## 2018-12-16 HISTORY — PX: COLONOSCOPY WITH PROPOFOL: SHX5780

## 2018-12-16 LAB — GLUCOSE, CAPILLARY
Glucose-Capillary: 116 mg/dL — ABNORMAL HIGH (ref 70–99)
Glucose-Capillary: 94 mg/dL (ref 70–99)
Glucose-Capillary: 128 mg/dL — ABNORMAL HIGH (ref 70–99)

## 2018-12-16 LAB — IRON AND TIBC
Iron: <5 ug/dL — ABNORMAL LOW (ref 28–170)
TIBC: 326 ug/dL (ref 250–450)

## 2018-12-16 LAB — VITAMIN B12: Vitamin B-12: 103 pg/mL — ABNORMAL LOW (ref 180–914)

## 2018-12-16 LAB — FOLATE: Folate: 9.8 ng/mL (ref >5.9)

## 2018-12-16 LAB — FERRITIN: Ferritin: 12 ng/mL (ref 11–307)

## 2018-12-16 LAB — HEMOGLOBIN: Hemoglobin: 8.6 g/dL — ABNORMAL LOW (ref 12.0–15.0)

## 2018-12-16 SURGERY — ENTEROSCOPY
Anesthesia: General

## 2018-12-16 MED ORDER — PROPOFOL 500 MG/50ML IV EMUL
INTRAVENOUS | Status: DC | PRN
  Administered 2018-12-16: 100 ug/kg/min via INTRAVENOUS

## 2018-12-16 MED ORDER — VITAMIN B-12 1000 MCG PO TABS: 1000.0000 ug | ORAL_TABLET | Freq: Every day | ORAL | 0 refills | Status: DC

## 2018-12-16 MED ORDER — SODIUM CHLORIDE 0.9 % IV SOLN
510.0000 mg | Freq: Once | INTRAVENOUS | Status: AC
  Administered 2018-12-16: 13:00:00 510 mg via INTRAVENOUS
  Filled 2018-12-16: qty 17

## 2018-12-16 MED ORDER — PROPOFOL 10 MG/ML IV BOLUS
INTRAVENOUS | Status: DC | PRN
  Administered 2018-12-16: 30 mg via INTRAVENOUS
  Administered 2018-12-16: 70 mg via INTRAVENOUS

## 2018-12-16 MED ORDER — APIXABAN 5 MG PO TABS: 5.0000 mg | ORAL_TABLET | Freq: Two times a day (BID) | ORAL | 1 refills | Status: DC

## 2018-12-16 MED ORDER — LIDOCAINE HCL (CARDIAC) PF 100 MG/5ML IV SOSY
PREFILLED_SYRINGE | INTRAVENOUS | Status: DC | PRN
  Administered 2018-12-16: 60 mg via INTRAVENOUS

## 2018-12-16 MED ORDER — LIDOCAINE HCL (PF) 2 % IJ SOLN
INTRAMUSCULAR | Status: AC
  Filled 2018-12-16: qty 10

## 2018-12-16 MED ORDER — EPHEDRINE SULFATE 50 MG/ML IJ SOLN
INTRAMUSCULAR | Status: DC | PRN
  Administered 2018-12-16: 10 mg via INTRAVENOUS

## 2018-12-16 MED ORDER — CYANOCOBALAMIN 1000 MCG/ML IJ SOLN
1000.0000 ug | Freq: Every day | INTRAMUSCULAR | Status: DC
  Administered 2018-12-16: 1000 ug via SUBCUTANEOUS
  Filled 2018-12-16 (×2): qty 1

## 2018-12-16 MED ORDER — AMIODARONE HCL 200 MG PO TABS: 200.0000 mg | ORAL_TABLET | Freq: Every day | ORAL | 0 refills | Status: DC

## 2018-12-16 NOTE — Telephone Encounter (Signed)
Dr. Rockey Situ rounded on the patient yesterday and noted:   [facility] will sign off.   Medication Recommendations:  Continue amiodarone, metoprolol, holding eliquis in the setting of GI bleed. Ideally would like to restart in 2 weeks Other recommendations (labs, testing, etc):  No further testing Follow up as an outpatient:  Web visit in 2 weeks, office will arrange

## 2018-12-16 NOTE — Progress Notes (Signed)
Patient discharged home per MD order. All discharge instructions given to patient and to daughter by phone. All questions answered and appointments reviewed.

## 2018-12-16 NOTE — Telephone Encounter (Signed)
Noted. Will follow as appropriate.  

## 2018-12-16 NOTE — Anesthesia Post-op Follow-up Note (Signed)
Anesthesia QCDR form completed.        

## 2018-12-16 NOTE — Anesthesia Procedure Notes (Signed)
Date/Time: 12/16/2018 10:55 AM Performed by: Allean Found, CRNA Pre-anesthesia Checklist: Patient identified, Emergency Drugs available, Patient being monitored, Timeout performed and Suction available Oxygen Delivery Method: Nasal cannula Placement Confirmation: positive ETCO2

## 2018-12-16 NOTE — Discharge Summary (Signed)
[facility] NAME: Diane Mullins    MR#:  [id number]  DATE OF BIRTH:  06-20-1933  DATE OF ADMISSION:  12/11/2018 ADMITTING PHYSICIAN: Arta Silence, MD  DATE OF DISCHARGE: 12/16/2018  PRIMARY CARE PHYSICIAN: [name], MD    ADMISSION DIAGNOSIS:  Gastrointestinal hemorrhage with melena [K92.1] Symptomatic anemia [D64.9] Constipation, unspecified constipation type [K59.00] Melena [K92.1]  DISCHARGE DIAGNOSIS:  Active Problems:   Melena   Symptomatic anemia      SECONDARY DIAGNOSIS:   Past Medical History:  Diagnosis Date  . Atrial fibrillation ([place])   . Benign paroxysmal positional vertigo 07/17/2014  . Chronic diastolic CHF (congestive heart failure) ([place])    a. echo 10/19: EF of 60-65%, no RWMA, Gr2DD, calcified mitral annulus with mild MR, mildly to moderately dilated left atrium, moderate TR, PASP 52 mmHg  . Diabetes mellitus   . H/O: rheumatic fever   . Hyperlipidemia   . Hypertension   . Pulmonary hypertension ([place])   . Stroke [facility])    TIA Jan. 1st  . Teratoma of left ovary   . Thyroid disease     HOSPITAL COURSE:   85 year old female with a history of PAF on amiodarone and Eliquis who presented to the hospital due to melena.  1.  Melena: She underwent EGD 3/28 which showslarge Hiatal hernoa but normal otherwise. Due to slight drop in hemoglobin (not requiring blood transfusion at this point) and large melanotic stool this she underwent colonoscopy. Colonoscopy showed severe diverticulosis in the sigmoid colon no evidence of diverticular bleed and there were hemorrhoids on the perianal exam. She also underwent small bowel enteroscopy which essentially was unremarkable and normal. GI recommended B12 supplementation as well as iron.  She received IV iron prior to discharge as well as B12.  She will continue on p.o. B12 supplements and ferrous sulfate.   2.  PAF:  She will continue  aspirin. Cardiology recommending not to continue Eliquis. Continue amiodarone. Heart rate was low and therefore metoprolol was discontinued.   3. Hypothyroid:Continue Synthroid  4. Diabetes: She will resume ADA diet with outpatient regimen   DISCHARGE CONDITIONS AND DIET:  ' Stable for discharge on heart healthy diabetic diet  CONSULTS OBTAINED:  Treatment Team:  Arta Silence, MD [name], MD  DRUG ALLERGIES:   Allergies  Allergen Reactions  . Oxycodone     Other reaction(s): Confusion Patient's daughter reported confusion/sensitivity to oxycodone.     DISCHARGE MEDICATIONS:   Allergies as of 12/16/2018      Reactions   Oxycodone    Other reaction(s): Confusion Patient's daughter reported confusion/sensitivity to oxycodone.      Medication List    STOP taking these medications   apixaban 5 MG Tabs tablet Commonly known as:  ELIQUIS   metoprolol succinate 25 MG 24 hr tablet Commonly known as:  TOPROL-XL     TAKE these medications   acetaminophen 325 MG tablet Commonly known as:  TYLENOL Take 650 mg by mouth every 6 (six) hours as needed.   amiodarone 200 MG tablet Commonly known as:  PACERONE Take 1 tablet (200 mg total) by mouth daily. What changed:    when to take this  additional instructions   aspirin EC 81 MG tablet Take 81 mg by mouth daily.   Bisacodyl Laxative 10 MG suppository Generic drug:  bisacodyl Place 10 mg rectally as needed for moderate constipation.   ferrous sulfate 325 (65 FE) MG EC tablet Take 1  tablet (325 mg total) by mouth daily with breakfast.   furosemide 20 MG tablet Commonly known as:  LASIX Take 1 tablet (20 mg total) by mouth daily.   glucose blood test strip Commonly known as:  ONE TOUCH ULTRA TEST Use as instructed to check Blood sugar three times a day.  E11.65 Dispense patient choice.   Insulin Glargine 100 UNIT/ML Solostar Pen Commonly known as:  Lantus SoloStar INJECT  17 UNITS  SUBCUTANEOUSLY IN THE MORNING What changed:    how much to take  how to take this  when to take this  additional instructions   levothyroxine 75 MCG tablet Commonly known as:  SYNTHROID, LEVOTHROID Take 1 tablet (75 mcg total) by mouth daily.   metFORMIN 1000 MG tablet Commonly known as:  GLUCOPHAGE Take 1 tablet (1,000 mg total) by mouth 2 (two) times daily with a meal. TAKE 1 TABLET TWICE DAILY  WITH  A  MEAL What changed:  additional instructions   omeprazole 20 MG capsule Commonly known as:  PRILOSEC Take 1 capsule (20 mg total) by mouth daily.   ondansetron 4 MG tablet Commonly known as:  ZOFRAN Take 4 mg by mouth every 8 (eight) hours as needed for nausea.   Pen Needles 32G X 4 MM Misc Inject 1 Dose into the skin daily.   polyethylene glycol packet Commonly known as:  MIRALAX / GLYCOLAX Take 17 g by mouth daily as needed for moderate constipation.   potassium chloride 10 MEQ tablet Commonly known as:  K-DUR TAKE 1 TABLET BY MOUTH EVERY DAY   simvastatin 20 MG tablet Commonly known as:  ZOCOR Take 1 tablet (20 mg total) by mouth every evening.   vitamin B-12 1000 MCG tablet Commonly known as:  CYANOCOBALAMIN Take 1 tablet (1,000 mcg total) by mouth daily.            Durable Medical Equipment  (From admission, onward)         Start     Ordered   12/16/18 1152  For home use only DME Walker rolling  Once    Question:  Patient needs a walker to treat with the following condition  Answer:  Weakness   12/16/18 1152            Today   CHIEF COMPLAINT:  Patient had colonoscopy and push enteroscopy today.   VITAL SIGNS:  Blood pressure (!) 131/51, pulse 94, temperature (!) 97 F (36.1 C), temperature source Tympanic, resp. rate 13, height 5\' 2"  (1.575 m), weight 89 kg, SpO2 98 %.   REVIEW OF SYSTEMS:  Review of Systems  Constitutional: Negative.  Negative for chills, fever and malaise/fatigue.  HENT: Negative.  Negative for ear discharge,  ear pain, hearing loss, nosebleeds and sore throat.   Eyes: Negative.  Negative for blurred vision and pain.  Respiratory: Negative.  Negative for cough, hemoptysis, shortness of breath and wheezing.   Cardiovascular: Negative.  Negative for chest pain, palpitations and leg swelling.  Gastrointestinal: Negative.  Negative for abdominal pain, blood in stool, diarrhea, nausea and vomiting.  Genitourinary: Negative.  Negative for dysuria.  Musculoskeletal: Negative.  Negative for back pain.  Skin: Negative.   Neurological: Negative for dizziness, tremors, speech change, focal weakness, seizures and headaches.  Endo/Heme/Allergies: Negative.  Does not bruise/bleed easily.  Psychiatric/Behavioral: Negative.  Negative for depression, hallucinations and suicidal ideas.     PHYSICAL EXAMINATION:  GENERAL:  85 y.o.-year-old patient lying in the bed with no acute distress.  NECK:  Supple, no jugular venous distention. No thyroid enlargement, no tenderness.  LUNGS: Normal breath sounds bilaterally, no wheezing, rales,rhonchi  No use of accessory muscles of respiration.  CARDIOVASCULAR: S1, S2 normal. No murmurs, rubs, or gallops.  ABDOMEN: Soft, non-tender, non-distended. Bowel sounds present. No organomegaly or mass.  EXTREMITIES: No pedal edema, cyanosis, or clubbing.  PSYCHIATRIC: The patient is alert and oriented x 3.  SKIN: No obvious rash, lesion, or ulcer.   DATA REVIEW:   CBC Recent Labs  Lab 12/15/18 1442 12/16/18 0830  WBC 7.8  --   HGB 8.6* 8.6*  HCT 27.9*  --   PLT 310  --     Chemistries  Recent Labs  Lab 12/11/18 1805 12/13/18 0455 12/13/18 1036  NA 139  --  139  K 4.3  --  4.0  CL 103  --  103  CO2 22  --  25  GLUCOSE 157*  --  209*  BUN 21  --  10  CREATININE 1.09*  --  1.04*  CALCIUM 8.8*  --  8.8*  MG 1.6* 1.9  --   AST 20  --   --   ALT 11  --   --   ALKPHOS 63  --   --   BILITOT 0.7  --   --     Cardiac Enzymes Recent Labs  Lab 12/11/18 1805   TROPONINI <0.03    Microbiology Results  @[email or web address] @  RADIOLOGY:  Ct Angio Head W Or Wo Contrast  Result Date: 12/14/2018 CLINICAL DATA:  Dysarthria.  Ataxia. EXAM: CT ANGIOGRAPHY HEAD AND NECK TECHNIQUE: Multidetector CT imaging of the head and neck was performed using the standard protocol during bolus administration of intravenous contrast. Multiplanar CT image reconstructions and MIPs were obtained to evaluate the vascular anatomy. Carotid stenosis measurements (when applicable) are obtained utilizing NASCET criteria, using the distal internal carotid diameter as the denominator. CONTRAST:  35mL OMNIPAQUE IOHEXOL 350 MG/ML SOLN COMPARISON:  Head MRA 09/20/2015 FINDINGS: CTA NECK FINDINGS Aortic arch: Standard 3 vessel aortic arch with mild atherosclerotic plaque. Widely patent arch vessel origins. Right carotid system: Patent with mild calcified plaque about the carotid bifurcation. No evidence of dissection or significant stenosis. Retropharyngeal course of the distal common and proximal internal carotid arteries. Tortuous distal cervical ICA. Left carotid system: Patent with mild calcified plaque about the carotid bifurcation. No evidence of dissection or significant stenosis. Retropharyngeal course of the distal common carotid artery. Vertebral arteries: Patent without evidence of stenosis or dissection. Moderately dominant left vertebral artery. Skeleton: Advanced cervical facet arthrosis. Moderate disc degeneration at C6-7. Other neck: No mass or enlarged lymph nodes. Upper chest: Clear lung apices. Review of the MIP images confirms the above findings CTA HEAD FINDINGS Anterior circulation: The internal carotid arteries are patent from skull base to carotid termini with nonstenotic plaque bilaterally. ACAs and MCAs are patent without evidence of proximal branch occlusion or significant proximal stenosis. No aneurysm is identified. Posterior circulation: The intracranial vertebral arteries are  widely patent to the basilar. Patent right PICA and bilateral SCAs are present. A left PICA and AICAs are not clearly identified. The basilar artery is widely patent. There is a fetal origin of the left PCA. A moderate right P1 stenosis is noted. No aneurysm is identified. Venous sinuses: Patent. Anatomic variants: Fetal left PCA. Delayed phase: No abnormal enhancement. Review of the MIP images confirms the above findings IMPRESSION: 1. No large vessel occlusion. 2. Mild cervical carotid artery atherosclerosis without stenosis. 3.  Widely patent vertebral arteries. 4. Moderate right P1 stenosis. 5.  Aortic Atherosclerosis (ICD10-I70.0). Electronically Signed   By: Logan Bores M.D.   On: 12/14/2018 21:03   Ct Head Wo Contrast  Result Date: 12/14/2018 CLINICAL DATA:  Slurred speech.  History of stroke. EXAM: CT HEAD WITHOUT CONTRAST TECHNIQUE: Contiguous axial images were obtained from the base of the skull through the vertex without intravenous contrast. COMPARISON:  March 30, 2016 FINDINGS: Brain: No subdural, epidural, or subarachnoid hemorrhage. Cerebellum and brainstem are normal. Basal cisterns are patent ventricles and sulci are prominent. White matter changes are identified. No acute cortical ischemia or infarct identified. No mass effect or midline shift. Vascular: No hyperdense vessel or unexpected calcification. Skull: Normal. Negative for fracture or focal lesion. Sinuses/Orbits: No acute finding. Other: None. IMPRESSION: 1. No acute intracranial abnormalities. Electronically Signed   By: Dorise Bullion III M.D   On: 12/14/2018 18:50   Ct Angio Neck W Or Wo Contrast  Result Date: 12/14/2018 CLINICAL DATA:  Dysarthria.  Ataxia. EXAM: CT ANGIOGRAPHY HEAD AND NECK TECHNIQUE: Multidetector CT imaging of the head and neck was performed using the standard protocol during bolus administration of intravenous contrast. Multiplanar CT image reconstructions and MIPs were obtained to evaluate the vascular  anatomy. Carotid stenosis measurements (when applicable) are obtained utilizing NASCET criteria, using the distal internal carotid diameter as the denominator. CONTRAST:  28mL OMNIPAQUE IOHEXOL 350 MG/ML SOLN COMPARISON:  Head MRA 09/20/2015 FINDINGS: CTA NECK FINDINGS Aortic arch: Standard 3 vessel aortic arch with mild atherosclerotic plaque. Widely patent arch vessel origins. Right carotid system: Patent with mild calcified plaque about the carotid bifurcation. No evidence of dissection or significant stenosis. Retropharyngeal course of the distal common and proximal internal carotid arteries. Tortuous distal cervical ICA. Left carotid system: Patent with mild calcified plaque about the carotid bifurcation. No evidence of dissection or significant stenosis. Retropharyngeal course of the distal common carotid artery. Vertebral arteries: Patent without evidence of stenosis or dissection. Moderately dominant left vertebral artery. Skeleton: Advanced cervical facet arthrosis. Moderate disc degeneration at C6-7. Other neck: No mass or enlarged lymph nodes. Upper chest: Clear lung apices. Review of the MIP images confirms the above findings CTA HEAD FINDINGS Anterior circulation: The internal carotid arteries are patent from skull base to carotid termini with nonstenotic plaque bilaterally. ACAs and MCAs are patent without evidence of proximal branch occlusion or significant proximal stenosis. No aneurysm is identified. Posterior circulation: The intracranial vertebral arteries are widely patent to the basilar. Patent right PICA and bilateral SCAs are present. A left PICA and AICAs are not clearly identified. The basilar artery is widely patent. There is a fetal origin of the left PCA. A moderate right P1 stenosis is noted. No aneurysm is identified. Venous sinuses: Patent. Anatomic variants: Fetal left PCA. Delayed phase: No abnormal enhancement. Review of the MIP images confirms the above findings IMPRESSION: 1. No  large vessel occlusion. 2. Mild cervical carotid artery atherosclerosis without stenosis. 3. Widely patent vertebral arteries. 4. Moderate right P1 stenosis. 5.  Aortic Atherosclerosis (ICD10-I70.0). Electronically Signed   By: Logan Bores M.D.   On: 12/14/2018 21:03   Mr Brain Wo Contrast  Result Date: 12/14/2018 CLINICAL DATA:  Dysarthria.  Ataxia. EXAM: MRI HEAD WITHOUT CONTRAST TECHNIQUE: Multiplanar, multiecho pulse sequences of the brain and surrounding structures were obtained without intravenous contrast. COMPARISON:  Head CT 12/06/2018 and MRI 10/22/2018 FINDINGS: The study is mildly motion degraded. Brain: There is no evidence of acute infarct, intracranial hemorrhage, mass,  midline shift, or extra-axial fluid collection. There is mild cerebral atrophy. Patchy T2 hyperintensities in the cerebral white matter are similar to the prior MRI and nonspecific but likely reflect moderate chronic small vessel ischemia given patient's vascular risk factors. A tiny chronic left parietal cortical infarct is unchanged. Vascular: Major intracranial vascular flow voids are preserved. Skull and upper cervical spine: Unremarkable bone marrow signal. Sinuses/Orbits: Unremarkable orbits. Clear paranasal sinuses. Trace bilateral mastoid fluid. Other: None. IMPRESSION: 1. No acute intracranial abnormality. 2. Moderate chronic small vessel ischemic disease. Electronically Signed   By: Logan Bores M.D.   On: 12/14/2018 21:08      Allergies as of 12/16/2018      Reactions   Oxycodone    Other reaction(s): Confusion Patient's daughter reported confusion/sensitivity to oxycodone.      Medication List    STOP taking these medications   apixaban 5 MG Tabs tablet Commonly known as:  ELIQUIS   metoprolol succinate 25 MG 24 hr tablet Commonly known as:  TOPROL-XL     TAKE these medications   acetaminophen 325 MG tablet Commonly known as:  TYLENOL Take 650 mg by mouth every 6 (six) hours as needed.    amiodarone 200 MG tablet Commonly known as:  PACERONE Take 1 tablet (200 mg total) by mouth daily. What changed:    when to take this  additional instructions   aspirin EC 81 MG tablet Take 81 mg by mouth daily.   Bisacodyl Laxative 10 MG suppository Generic drug:  bisacodyl Place 10 mg rectally as needed for moderate constipation.   ferrous sulfate 325 (65 FE) MG EC tablet Take 1 tablet (325 mg total) by mouth daily with breakfast.   furosemide 20 MG tablet Commonly known as:  LASIX Take 1 tablet (20 mg total) by mouth daily.   glucose blood test strip Commonly known as:  ONE TOUCH ULTRA TEST Use as instructed to check Blood sugar three times a day.  E11.65 Dispense patient choice.   Insulin Glargine 100 UNIT/ML Solostar Pen Commonly known as:  Lantus SoloStar INJECT  17 UNITS SUBCUTANEOUSLY IN THE MORNING What changed:    how much to take  how to take this  when to take this  additional instructions   levothyroxine 75 MCG tablet Commonly known as:  SYNTHROID, LEVOTHROID Take 1 tablet (75 mcg total) by mouth daily.   metFORMIN 1000 MG tablet Commonly known as:  GLUCOPHAGE Take 1 tablet (1,000 mg total) by mouth 2 (two) times daily with a meal. TAKE 1 TABLET TWICE DAILY  WITH  A  MEAL What changed:  additional instructions   omeprazole 20 MG capsule Commonly known as:  PRILOSEC Take 1 capsule (20 mg total) by mouth daily.   ondansetron 4 MG tablet Commonly known as:  ZOFRAN Take 4 mg by mouth every 8 (eight) hours as needed for nausea.   Pen Needles 32G X 4 MM Misc Inject 1 Dose into the skin daily.   polyethylene glycol packet Commonly known as:  MIRALAX / GLYCOLAX Take 17 g by mouth daily as needed for moderate constipation.   potassium chloride 10 MEQ tablet Commonly known as:  K-DUR TAKE 1 TABLET BY MOUTH EVERY DAY   simvastatin 20 MG tablet Commonly known as:  ZOCOR Take 1 tablet (20 mg total) by mouth every evening.   vitamin B-12 1000  MCG tablet Commonly known as:  CYANOCOBALAMIN Take 1 tablet (1,000 mcg total) by mouth daily.  Durable Medical Equipment  (From admission, onward)         Start     Ordered   12/16/18 1152  For home use only DME Walker rolling  Once    Question:  Patient needs a walker to treat with the following condition  Answer:  Weakness   12/16/18 1152             Management plans discussed with the patient and she is in agreement. Stable for discharge   Patient should follow up with pcp  CODE STATUS:     Code Status Orders  (From admission, onward)         Start     Ordered   12/11/18 2237  Full code  Continuous     12/11/18 2236        Code Status History    Date Active Date Inactive Code Status Order ID Comments User Context   11/26/2018 0030 11/28/2018 1939 Full Code [id number]  Lance Coon, MD Inpatient   10/17/2018 1654 10/28/2018 2002 Full Code [id number]  Gladstone Lighter, MD ED   07/05/2018 2206 07/09/2018 2156 Full Code [id number]  Salary, Avel Peace, MD Inpatient   10/28/2015 1501 10/29/2015 1718 Full Code [id number]  Henreitta Leber, MD Inpatient   09/18/2015 1639 09/20/2015 2243 Full Code [id number]  Gladstone Lighter, MD Inpatient    Advance Directive Documentation     Most Recent Value  Type of Advance Directive  Healthcare Power of Attorney, Living will  Pre-existing out of facility DNR order (yellow form or pink MOST form)  -  "MOST" Form in Place?  -      TOTAL TIME TAKING CARE OF THIS PATIENT: 38 minutes.    Note: This dictation was prepared with Dragon dictation along with smaller phrase technology. Any transcriptional errors that result from this process are unintentional.  Bettey Costa M.D on 12/16/2018 at 12:01 PM  Between 7am to 6pm - Pager - [phone] After 6pm go to [email or web address] - password [email or web address] [facility]  Office  [phone]  CC: Primary care physician; [name], MD

## 2018-12-16 NOTE — Care Management Important Message (Signed)
Important Message  Patient Details  Name: Diane Mullins MRN: [id number] Date of Birth: Nov 25, 1932   Medicare Important Message Given:  Yes    Dannette Barbara 12/16/2018, 11:50 AM

## 2018-12-16 NOTE — Progress Notes (Signed)
Push enteroscopy and colonoscopy post procedure note:  Push enteroscopy was normal Colonoscopy revealed severe left colonic diverticulosis, 2 small polyps removed and one <1cm polyp in AO, not removed due to location of the polyp and risk for perforation   Recs:  - Cardiac diet today.  - Resume Eliquis (apixaban) at prior dose in 2 days due to polypectomy.  Refer to primary physician for further adjustment of therapy.  - Await pathology results.  - Refer to a general surgeon on a non-urgent basis for appendectomy as polypectomy not attempted due to location of the seesile polyp within the appendiceal orifice. - She will need referral to hematology for parenteral iron and B12 as pt has severe iron and B12 deficiency anemia. B12 <50 06/2018 and ferritin <20 in 08/2018. Please send home with prescription for B12 injections weekly for 2 months then monthly for 3-4 months - Check anti-intrinsic factor and anti-parietal antibodies - Recheck CBC in 1week after discharge, close follow up with PCP - Follow up with GI in 3-4weeks after discharge - GI will sign off at this time, please call GI back with questions/concerns  Cephas Darby, MD [address]  [place]  [place], [place] [address]  Main: [phone]  Fax: [phone] Pager: [phone]

## 2018-12-16 NOTE — Op Note (Signed)
[facility] Gastroenterology Patient Name: Diane Mullins Procedure Date: 12/16/2018 10:22 AM MRN: [id number] Account #: [id number] Date of Birth: [birth year] Admit Type: Outpatient Age: 85 Room: [facility] ENDO ROOM 4 Gender: Female Note Status: Finalized Procedure:            Small bowel enteroscopy Indications:          Melena Providers:            Lin Landsman MD, MD Referring MD:         Angela Adam. Caryl Bis (Referring MD) Medicines:            Monitored Anesthesia Care Complications:        No immediate complications. Estimated blood loss: None. Procedure:            Pre-Anesthesia Assessment:                       - Prior to the procedure, a History and Physical was                        performed, and patient medications and allergies were                        reviewed. The patient is competent. The risks and                        benefits of the procedure and the sedation options and                        risks were discussed with the patient. All questions                        were answered and informed consent was obtained.                        Patient identification and proposed procedure were                        verified by the physician, the nurse, the                        anesthesiologist, the anesthetist and the technician in                        the pre-procedure area in the procedure room in the                        endoscopy suite. Mental Status Examination: alert and                        oriented. Airway Examination: normal oropharyngeal                        airway and neck mobility. Respiratory Examination:                        clear to auscultation. CV Examination: normal.                        Prophylactic Antibiotics: The patient does not require  prophylactic antibiotics. Prior Anticoagulants: The                        patient has taken no previous anticoagulant or   antiplatelet agents. ASA Grade Assessment: III - A                        patient with severe systemic disease. After reviewing                        the risks and benefits, the patient was deemed in                        satisfactory condition to undergo the procedure. The                        anesthesia plan was to use monitored anesthesia care                        (MAC). Immediately prior to administration of                        medications, the patient was re-assessed for adequacy                        to receive sedatives. The heart rate, respiratory rate,                        oxygen saturations, blood pressure, adequacy of                        pulmonary ventilation, and response to care were                        monitored throughout the procedure. The physical status                        of the patient was re-assessed after the procedure.                       After obtaining informed consent, the endoscope was                        passed under direct vision. Throughout the procedure,                        the patient's blood pressure, pulse, and oxygen                        saturations were monitored continuously. The was                        introduced through the mouth and advanced to the                        proximal jejunum. The small bowel enteroscopy was                        accomplished without difficulty. The patient tolerated  the procedure well. Findings:      There was no evidence of significant pathology in the proximal jejunum.      There was no evidence of significant pathology in the duodenal bulb, in       the second portion of the duodenum, in the third portion of the duodenum       and in the fourth portion of the duodenum.      The esophagus was normal.      The stomach was normal other than large hiatal hernia. Impression:           - The examined portion of the jejunum was normal.                       - Normal  duodenal bulb, second portion of the duodenum,                        third portion of the duodenum and fourth portion of the                        duodenum.                       - Normal esophagus.                       - Normal stomach.                       - No specimens collected. Recommendation:       - Proceed with colonoscopy as scheduled                       - See colonoscopy report Dr. Milford Cage MD, MD 12/16/2018 10:42:57 AM This report has been signed electronically. Number of Addenda: 0 Note Initiated On: 12/16/2018 10:22 AM      [facility]

## 2018-12-16 NOTE — Telephone Encounter (Signed)
New Message:    Edwin Dada from [place] calling to get a one wee follow up. Please call at [phone].

## 2018-12-16 NOTE — Transfer of Care (Signed)
Immediate Anesthesia Transfer of Care Note  Patient: Diane Mullins  Procedure(s) Performed: Push ENTEROSCOPY (N/A ) COLONOSCOPY WITH PROPOFOL (N/A )  Patient Location: PACU  Anesthesia Type:General  Level of Consciousness: awake, alert  and oriented  Airway & Oxygen Therapy: Patient Spontanous Breathing and Patient connected to nasal cannula oxygen  Post-op Assessment: Report given to RN and Post -op Vital signs reviewed and stable  Post vital signs: Reviewed and stable  Last Vitals:  Vitals Value Taken Time  BP 143/60 12/16/2018 11:20 AM  Temp 36.1 C 12/16/2018 11:13 AM  Pulse 80 12/16/2018 11:22 AM  Resp 16 12/16/2018 11:22 AM  SpO2 100 % 12/16/2018 11:22 AM  Vitals shown include unvalidated device data.  Last Pain:  Vitals:   12/16/18 1113  TempSrc: Tympanic  PainSc: 0-No pain         Complications: No apparent anesthesia complications

## 2018-12-16 NOTE — Anesthesia Postprocedure Evaluation (Signed)
Anesthesia Post Note  Patient: Diane Mullins  Procedure(s) Performed: Push ENTEROSCOPY (N/A ) COLONOSCOPY WITH PROPOFOL (N/A )  Patient location during evaluation: PACU Anesthesia Type: General Level of consciousness: awake and alert Pain management: pain level controlled Vital Signs Assessment: post-procedure vital signs reviewed and stable Respiratory status: spontaneous breathing, nonlabored ventilation and respiratory function stable Cardiovascular status: blood pressure returned to baseline and stable Postop Assessment: no apparent nausea or vomiting Anesthetic complications: no     Last Vitals:  Vitals:   12/16/18 1113 12/16/18 1124  BP: (!) 105/31 (!) 131/51  Pulse: 94   Resp: 13   Temp: (!) 36.1 C   SpO2: 98%     Last Pain:  Vitals:   12/16/18 1124  TempSrc:   PainSc: 0-No pain                 Durenda Hurt

## 2018-12-16 NOTE — Anesthesia Preprocedure Evaluation (Addendum)
Anesthesia Evaluation  Patient identified by MRN, date of birth, ID band Patient awake    Reviewed: Allergy & Precautions, NPO status , Patient's Chart, lab work & pertinent test results  History of Anesthesia Complications Negative for: history of anesthetic complications  Airway Mallampati: III  TM Distance: >3 FB     Dental  (+) Poor Dentition   Pulmonary shortness of breath, neg sleep apnea, neg COPD, neg recent URI,           Cardiovascular hypertension, (-) angina+ CAD and +CHF  (-) Past MI, (-) Cardiac Stents and (-) CABG + dysrhythmias Atrial Fibrillation  - Systolic murmurs    Neuro/Psych neg Seizures PSYCHIATRIC DISORDERS Anxiety TIACVA    GI/Hepatic negative GI ROS, Neg liver ROS,   Endo/Other  diabetes, Oral Hypoglycemic AgentsHypothyroidism   Renal/GU Renal InsufficiencyRenal disease     Musculoskeletal negative musculoskeletal ROS (+)   Abdominal (+) + obese,   Peds  Hematology  (+) Blood dyscrasia, anemia ,   Anesthesia Other Findings Past Medical History: 07/17/2014: Benign paroxysmal positional vertigo No date: Chronic diastolic CHF (congestive heart failure) (HCC)     Comment:  a. echo 10/19: EF of 60-65%, no RWMA, Gr2DD, calcified               mitral annulus with mild MR, mildly to moderately dilated              left atrium, moderate TR, PASP 52 mmHg No date: Diabetes mellitus No date: H/O: rheumatic fever No date: Hyperlipidemia No date: Hypertension No date: Pulmonary hypertension (HCC) No date: Stroke [facility])     Comment:  TIA Jan. 1st No date: Teratoma of left ovary No date: Thyroid disease   Reproductive/Obstetrics                            Anesthesia Physical  Anesthesia Plan  ASA: III  Anesthesia Plan: General   Post-op Pain Management:    Induction: Intravenous  PONV Risk Score and Plan: Propofol infusion and TIVA  Airway Management Planned:  Natural Airway and Nasal Cannula  Additional Equipment:   Intra-op Plan:   Post-operative Plan:   Informed Consent: I have reviewed the patients History and Physical, chart, labs and discussed the procedure including the risks, benefits and alternatives for the proposed anesthesia with the patient or authorized representative who has indicated his/her understanding and acceptance.     Dental advisory given  Plan Discussed with: CRNA and Anesthesiologist  Anesthesia Plan Comments:         Anesthesia Quick Evaluation

## 2018-12-16 NOTE — Telephone Encounter (Signed)
Copied from [employer] [facility] [phone]. Topic: General - Inquiry >> Dec 16, 2018 12:15 PM Vernona Rieger wrote: Reason for CRM: [facility] called to schedule hospital fu for 4/6.

## 2018-12-16 NOTE — TOC Transition Note (Signed)
Transition of Care [facility]) - CM/SW Discharge Note   Patient Details  Name: Diane Mullins MRN: [id number] Date of Birth: Feb 09, 1933  Transition of Care [facility]) CM/SW Contact:  Latanya Maudlin, RN Phone Number: 12/16/2018, 11:57 AM   Clinical Narrative:  Patient to be discharged per MD order. Orders in place for home health services. Patient was previously set up with [facility]. Notified them of pending discharge. No DME ordered. Back to [facility]      Final next level of care: [place] Barriers to Discharge: No Barriers Identified   Patient Goals and CMS Choice Patient states their goals for this hospitalization and ongoing recovery are:: "I want to be safe at home and be able to move around." CMS Medicare.gov Compare Post Acute Care list provided to:: Patient Choice offered to / list presented to : Patient  Discharge Placement              Patient chooses bed at: (N/A: Discharging home with home health) Patient to be transferred to facility by: (Family) Name of family member notified: [place] Daughter  Patient and family notified of of transfer: 12/15/18  Discharge Plan and Services In-house Referral: PCP / [facility], [facility] Discharge Planning Services: CM Consult Post Acute Care Choice: [place]          DME Arranged: N/A DME Agency: NA HH Arranged: NA [place] Agency: [facility]) Interventions     Readmission Risk Interventions Readmission Risk Prevention Plan 12/15/2018 12/14/2018 11/26/2018  Transportation Screening Complete Complete Complete  PCP or Specialist Appt within 5-7 Days - - Complete  PCP or Specialist Appt within 3-5 Days Patient refused - -  Home Care Screening - - Complete  Medication Review (RN CM) - - Complete  HRI or Home Care Consult Complete - -  Social Work Consult for [place] Planning/Counseling Complete - -   Palliative Care Screening Not Applicable - -  Medication Review Press photographer) Complete Not Complete -  Med Review Comments - Review upon discharge -  PCP or Specialist appointment within 3-5 days of discharge - Not Complete -  PCP/Specialist Appt Not Complete comments - Review prior to discharge for appointment with cardiology -  [place] or Home Care Consult - Complete -  SW Recovery Care/Counseling Consult - Complete -  Palliative Care Screening - Not Applicable -  [place] - Complete -  Some recent data might be hidden

## 2018-12-16 NOTE — Op Note (Signed)
[facility] Gastroenterology Patient Name: Diane Mullins Procedure Date: 12/16/2018 10:07 AM MRN: [id number] Account #: [id number] Date of Birth: 1933-07-16 Admit Type: Outpatient Age: 85 Room: [facility] ENDO ROOM 4 Gender: Female Note Status: Finalized Procedure:            Colonoscopy Indications:          Melena, Evaluation of unexplained GI bleeding                        presenting with Melena (upper GI source has been                        excluded) Providers:            Lin Landsman MD, MD Medicines:            Monitored Anesthesia Care Complications:        No immediate complications. Estimated blood loss: None. Procedure:            Pre-Anesthesia Assessment:                       - Prior to the procedure, a History and Physical was                        performed, and patient medications and allergies were                        reviewed. The patient is competent. The risks and                        benefits of the procedure and the sedation options and                        risks were discussed with the patient. All questions                        were answered and informed consent was obtained.                        Patient identification and proposed procedure were                        verified by the physician, the nurse, the                        anesthesiologist, the anesthetist and the technician in                        the pre-procedure area in the procedure room in the                        endoscopy suite. Mental Status Examination: alert and                        oriented. Airway Examination: normal oropharyngeal                        airway and neck mobility. Respiratory Examination:  clear to auscultation. CV Examination: normal.                        Prophylactic Antibiotics: The patient does not require                        prophylactic antibiotics. Prior Anticoagulants: The   patient has taken Eliquis (apixaban), last dose was 3                        days prior to procedure. ASA Grade Assessment: III - A                        patient with severe systemic disease. After reviewing                        the risks and benefits, the patient was deemed in                        satisfactory condition to undergo the procedure. The                        anesthesia plan was to use monitored anesthesia care                        (MAC). Immediately prior to administration of                        medications, the patient was re-assessed for adequacy                        to receive sedatives. The heart rate, respiratory rate,                        oxygen saturations, blood pressure, adequacy of                        pulmonary ventilation, and response to care were                        monitored throughout the procedure. The physical status                        of the patient was re-assessed after the procedure.                       After obtaining informed consent, the colonoscope was                        passed under direct vision. Throughout the procedure,                        the patient's blood pressure, pulse, and oxygen                        saturations were monitored continuously. The                        Colonoscope was introduced through the anus and  advanced to the the terminal ileum, with identification                        of the appendiceal orifice and IC valve. The                        colonoscopy was performed without difficulty. The                        patient tolerated the procedure well. The quality of                        the bowel preparation was fair. Findings:      No fresh or old blood seen      Hemorrhoids were found on perianal exam.      The terminal ileum appeared normal.      A 5 mm polyp was found in the cecum. The polyp was sessile. The polyp       was removed with a cold snare. Resection  and retrieval were complete.      A 6 mm polyp was found in the appendiceal orifice. The polyp was       sessile. Polypectomy was not attempted due to location of the polyp       embedded with in the appendiceal orifice.      A 8 mm polyp was found in the distal transverse colon. The polyp was       sessile. The polyp was removed with a hot snare. Resection and retrieval       were complete.      Multiple small and large-mouthed diverticula were found in the sigmoid       colon and descending colon. There was no evidence of diverticular       bleeding.      Non-bleeding external hemorrhoids were found during retroflexion and       during perianal exam. The hemorrhoids were large. Impression:           - Preparation of the colon was fair.                       - Hemorrhoids found on perianal exam.                       - The examined portion of the ileum was normal.                       - One 5 mm polyp in the cecum, removed with a cold                        snare. Resected and retrieved.                       - One 6 mm polyp at the appendiceal orifice. Resection                        not attempted.                       - One 8 mm polyp in the distal transverse colon,  removed with a hot snare. Resected and retrieved.                       - Severe diverticulosis in the sigmoid colon and in the                        descending colon. There was no evidence of diverticular                        bleeding.                       - Non-bleeding external hemorrhoids. Recommendation:       - Return patient to hospital ward for ongoing care.                       - Cardiac diet today.                       - Resume Eliquis (apixaban) at prior dose in 2 days due                        to polypectomy. Refer to primary physician for further                        adjustment of therapy.                       - Await pathology results.                       - Refer to a  general surgeon on a non-urgent basis for                        appendectomy as polypectomy not attempted due to                        location of the seesile polyp within the appendiceal                        orifice.                       - She will need referral to hematology for parenteral                        iron and B12 as pt has severe iron and B12 deficiency                        anemia                       - Check anti-intrinsic factor and anti-parietal                        antibodies                       - Recheck CBC in 1week after discharge, close follow up                        with PCP                       -  Follow up with GI in 3-4weeks after discharge Procedure Code(s):    --- Professional ---                       [phone], Colonoscopy, flexible; with removal of tumor(s),                        polyp(s), or other lesion(s) by snare technique Diagnosis Code(s):    --- Professional ---                       K64.4, Residual hemorrhoidal skin tags                       K63.5, Polyp of colon                       K92.1, Melena (includes Hematochezia)                       K57.30, Diverticulosis of large intestine without                        perforation or abscess without bleeding CPT copyright 2019 [employer]. All rights reserved. The codes documented in this report are preliminary and upon coder review may  be revised to meet current compliance requirements. Dr. Ulyess Mort Lin Landsman MD, MD 12/16/2018 11:21:16 AM This report has been signed electronically. Number of Addenda: 0 Note Initiated On: 12/16/2018 10:07 AM Scope Withdrawal Time: 0 hours 13 minutes 51 seconds  Total Procedure Duration: 0 hours 18 minutes 46 seconds       [facility]

## 2018-12-17 ENCOUNTER — Other Ambulatory Visit: Payer: Self-pay

## 2018-12-17 ENCOUNTER — Emergency Department: Payer: PPO

## 2018-12-17 ENCOUNTER — Encounter: Payer: Self-pay | Admitting: Emergency Medicine

## 2018-12-17 ENCOUNTER — Emergency Department
Admission: EM | Admit: 2018-12-17 | Discharge: 2018-12-18 | Disposition: A | Discharge status: Rehabilitation Facility | Payer: PPO | Attending: Student in an Organized Health Care Education/Training Program | Admitting: Student in an Organized Health Care Education/Training Program

## 2018-12-17 DIAGNOSIS — Z20828 Contact with and (suspected) exposure to other viral communicable diseases: Secondary | ICD-10-CM | POA: Diagnosis not present

## 2018-12-17 DIAGNOSIS — M6281 Muscle weakness (generalized): Secondary | ICD-10-CM | POA: Insufficient documentation

## 2018-12-17 DIAGNOSIS — Z7982 Long term (current) use of aspirin: Secondary | ICD-10-CM | POA: Diagnosis not present

## 2018-12-17 DIAGNOSIS — I10 Essential (primary) hypertension: Secondary | ICD-10-CM | POA: Diagnosis not present

## 2018-12-17 DIAGNOSIS — I5032 Chronic diastolic (congestive) heart failure: Secondary | ICD-10-CM | POA: Diagnosis not present

## 2018-12-17 DIAGNOSIS — I110 Hypertensive heart disease with heart failure: Secondary | ICD-10-CM | POA: Insufficient documentation

## 2018-12-17 DIAGNOSIS — R531 Weakness: Secondary | ICD-10-CM | POA: Insufficient documentation

## 2018-12-17 DIAGNOSIS — Z79899 Other long term (current) drug therapy: Secondary | ICD-10-CM | POA: Insufficient documentation

## 2018-12-17 DIAGNOSIS — E119 Type 2 diabetes mellitus without complications: Secondary | ICD-10-CM | POA: Insufficient documentation

## 2018-12-17 DIAGNOSIS — E039 Hypothyroidism, unspecified: Secondary | ICD-10-CM | POA: Diagnosis not present

## 2018-12-17 DIAGNOSIS — R29898 Other symptoms and signs involving the musculoskeletal system: Secondary | ICD-10-CM | POA: Diagnosis not present

## 2018-12-17 DIAGNOSIS — R7889 Finding of other specified substances, not normally found in blood: Secondary | ICD-10-CM | POA: Diagnosis not present

## 2018-12-17 DIAGNOSIS — R918 Other nonspecific abnormal finding of lung field: Secondary | ICD-10-CM | POA: Diagnosis not present

## 2018-12-17 LAB — CBC WITH DIFFERENTIAL/PLATELET
Abs Immature Granulocytes: 0.02 10*3/uL (ref 0.00–0.07)
Basophils Absolute: 0 10*3/uL (ref 0.0–0.1)
Basophils Relative: 1 %
Eosinophils Absolute: 0.2 10*3/uL (ref 0.0–0.5)
Eosinophils Relative: 3 %
HCT: 29.8 % — ABNORMAL LOW (ref 36.0–46.0)
Hemoglobin: 9.2 g/dL — ABNORMAL LOW (ref 12.0–15.0)
Immature Granulocytes: 0 %
Lymphocytes Relative: 28 %
Lymphs Abs: 1.9 10*3/uL (ref 0.7–4.0)
MCH: 28 pg (ref 26.0–34.0)
MCHC: 30.9 g/dL (ref 30.0–36.0)
MCV: 90.9 fL (ref 80.0–100.0)
Monocytes Absolute: 0.8 10*3/uL (ref 0.1–1.0)
Monocytes Relative: 12 %
NEUTROS PCT: 56 %
NRBC: 0 % (ref 0.0–0.2)
Neutro Abs: 3.8 10*3/uL (ref 1.7–7.7)
Platelets: 342 10*3/uL (ref 150–400)
RBC: 3.28 MIL/uL — AB (ref 3.87–5.11)
RDW: 16 % — AB (ref 11.5–15.5)
WBC: 6.7 10*3/uL (ref 4.0–10.5)

## 2018-12-17 LAB — COMPREHENSIVE METABOLIC PANEL
ALT: 14 U/L (ref 0–44)
AST: 18 U/L (ref 15–41)
Albumin: 3.4 g/dL — ABNORMAL LOW (ref 3.5–5.0)
Alkaline Phosphatase: 84 U/L (ref 38–126)
Anion gap: 9 (ref 5–15)
BUN: <5 mg/dL — ABNORMAL LOW (ref 8–23)
CHLORIDE: 107 mmol/L (ref 98–111)
CO2: 25 mmol/L (ref 22–32)
Calcium: 9 mg/dL (ref 8.9–10.3)
Creatinine, Ser: 0.97 mg/dL (ref 0.44–1.00)
GFR calc Af Amer: >60 mL/min (ref >60)
GFR calc non Af Amer: 54 mL/min — ABNORMAL LOW (ref >60)
Glucose, Bld: 154 mg/dL — ABNORMAL HIGH (ref 70–99)
POTASSIUM: 4 mmol/L (ref 3.5–5.1)
Sodium: 141 mmol/L (ref 135–145)
Total Bilirubin: 0.7 mg/dL (ref 0.3–1.2)
Total Protein: 6.2 g/dL — ABNORMAL LOW (ref 6.5–8.1)

## 2018-12-17 LAB — URINALYSIS, COMPLETE (UACMP) WITH MICROSCOPIC
BACTERIA UA: NONE SEEN
Bilirubin Urine: NEGATIVE
Glucose, UA: 50 mg/dL — AB
Hgb urine dipstick: NEGATIVE
KETONES UR: NEGATIVE mg/dL
Leukocytes,Ua: NEGATIVE
Nitrite: NEGATIVE
Protein, ur: NEGATIVE mg/dL
SQUAMOUS EPITHELIAL / LPF: NONE SEEN (ref 0–5)
Specific Gravity, Urine: 1.01 (ref 1.005–1.030)
pH: 5 (ref 5.0–8.0)

## 2018-12-17 LAB — TYPE AND SCREEN
ABO/RH(D): O POS
Antibody Screen: NEGATIVE

## 2018-12-17 LAB — SURGICAL PATHOLOGY

## 2018-12-17 MED ORDER — AMIODARONE HCL 200 MG PO TABS: 200.0000 mg | ORAL_TABLET | Freq: Every day | ORAL | Status: DC

## 2018-12-17 MED ORDER — ASPIRIN EC 81 MG PO TBEC
81.0000 mg | DELAYED_RELEASE_TABLET | Freq: Every day | ORAL | Status: DC
  Administered 2018-12-17: 81 mg via ORAL
  Filled 2018-12-17: qty 1

## 2018-12-17 MED ORDER — ACETAMINOPHEN 325 MG PO TABS: 650.0000 mg | ORAL_TABLET | Freq: Four times a day (QID) | ORAL | Status: DC | PRN

## 2018-12-17 MED ORDER — CLINDAMYCIN PHOSPHATE 600 MG/50ML IV SOLN
600.0000 mg | Freq: Once | INTRAVENOUS | Status: DC
  Filled 2018-12-17: qty 50

## 2018-12-17 MED ORDER — AMIODARONE HCL 200 MG PO TABS
200.0000 mg | ORAL_TABLET | Freq: Once | ORAL | Status: AC
  Administered 2018-12-17: 200 mg via ORAL
  Filled 2018-12-17: qty 1

## 2018-12-17 MED ORDER — POTASSIUM CHLORIDE 20 MEQ PO PACK
10.0000 meq | PACK | Freq: Every day | ORAL | Status: DC
  Administered 2018-12-17: 10 meq via ORAL
  Filled 2018-12-17: qty 1

## 2018-12-17 MED ORDER — INSULIN GLARGINE 100 UNIT/ML ~~LOC~~ SOLN
17.0000 [IU] | Freq: Every day | SUBCUTANEOUS | Status: DC
  Administered 2018-12-17: 17 [IU] via SUBCUTANEOUS
  Filled 2018-12-17 (×2): qty 0.17

## 2018-12-17 MED ORDER — FUROSEMIDE 40 MG PO TABS
20.0000 mg | ORAL_TABLET | Freq: Every day | ORAL | Status: DC
  Administered 2018-12-17: 20 mg via ORAL
  Filled 2018-12-17: qty 1

## 2018-12-17 MED ORDER — AMIODARONE HCL 200 MG PO TABS
200.0000 mg | ORAL_TABLET | Freq: Every day | ORAL | Status: DC
  Filled 2018-12-17: qty 1

## 2018-12-17 NOTE — Evaluation (Signed)
Physical Therapy Evaluation Patient Details Name: Diane Mullins MRN: [id number] DOB: [birth year] Today's Date: 12/17/2018   History of Present Illness  Patient is a 85 year old female who presented to the emergency department 12/17/2018 with complaints of difficulty standing due to weakness. She was discharged home yesterday from the hospital after being treated for anemia and GI bleed. She had refused SNF placement reccomended by physical therapy at her last hospitalization. This visit to the ER she was diagnosed with anemia, electrolyte abnormality, GI bleed, deconditioning, and dehydration. Her PMH includes recent GI bleed, of Afib, BPPV, CHF, DMII, rheumatic fever, TIA, pulmonary HTN.   Clinical Impression  Prior to hospitalization, pt was mod I with ambulation for household and short community distance. She was I with ADLs and required assistance with IADLs. Upon evaluation, pt requires mod to max A for bed mobility and transfers and is unable to ambulate safely at this time. Patient was unable to log roll without assistance during supine to sit. Required support for trunk and assistance with BLE with sit to supine. She was unable to lift feet off the ground during transfer attempts and demo loss of balance requiring clinician assistance to prevent fall during transfers. Pt would benefit from skilled physical therapy to return to prior level of function and improve independence for functional mobility.     Follow Up Recommendations SNF    Equipment Recommendations  (TBD in next venue of care)    Recommendations for Other Services       Precautions / Restrictions Precautions Precautions: Fall Precaution Comments: hard of hearing Restrictions Weight Bearing Restrictions: No      Mobility  Bed Mobility Overal bed mobility: Needs Assistance Bed Mobility: Supine to Sit;Sit to Supine     Supine to sit: Mod assist Sit to supine: Max assist;+2 for physical assistance   General bed  mobility comments: pt unable to log roll without assistance during supine to sit. Required support for trunk and assistance with BLE with sit to supine.   Transfers Overall transfer level: Needs assistance Equipment used: Rolling walker (2 wheeled) Transfers: Sit to/from Stand Sit to Stand: Mod assist;+2 safety/equipment         General transfer comment: Unable to lift feet off the ground. loss of balance requiring clinician assistance to prevent fall.   Ambulation/Gait Ambulation/Gait assistance: (unable at this time, shuffling feet, unsteady with transfer)           General Gait Details: unable at this time  Stairs            Wheelchair Mobility    Modified Rankin (Stroke Patients Only)       Balance Overall balance assessment: Needs assistance Sitting-balance support: Bilateral upper extremity supported;Feet supported Sitting balance-Leahy Scale: Good     Standing balance support: Bilateral upper extremity supported Standing balance-Leahy Scale: Poor                               Pertinent Vitals/Pain Pain Assessment: No/denies pain    Home Living Family/patient expects to be discharged to:: Other (Comment)([facility])   Available Help at Discharge: Family Type of Home: Independent living facility Home Access: Level entry       Home Equipment: Walker - 4 wheels;Grab bars - tub/shower Additional Comments: Single level, no steps required.  Walk in shower with seat; grab bars throughout apartment. handicapped height toilet.    Prior Function Level of  Independence: Independent with assistive device(s)         Comments: Pt walks down to meals 2x/day. She used to be able to ambulate the entire way without stopping to sit but more recently must stop 1-2 times.      Hand Dominance   Dominant Hand: Right    Extremity/Trunk Assessment   Upper Extremity Assessment Upper Extremity Assessment: Generalized  weakness(unable to elevated right shoulder. reports she needs a shoulder replacement)    Lower Extremity Assessment Lower Extremity Assessment: Generalized weakness    Cervical / Trunk Assessment Cervical / Trunk Assessment: Other exceptions Cervical / Trunk Exceptions: pt with difficulty with trunk control with heavy challenge. Req. assistance with lowering trunk to bed during sit to supine.   Communication   Communication: HOH  Cognition Arousal/Alertness: Awake/alert Behavior During Therapy: WFL for tasks assessed/performed Overall Cognitive Status: Within Functional Limits for tasks assessed                                        General Comments      Exercises Other Exercises Other Exercises: Sit<> stand transfers x2, with education for hand placement and AD management, body mechanics   Assessment/Plan    PT Assessment Patient needs continued PT services  PT Problem List Decreased strength;Decreased range of motion;Decreased activity tolerance;Decreased balance;Decreased mobility;Decreased knowledge of use of DME;Decreased safety awareness;Cardiopulmonary status limiting activity       PT Treatment Interventions DME instruction;Gait training;Functional mobility training;Therapeutic activities;Therapeutic exercise;Balance training;Neuromuscular re-education;Patient/family education    PT Goals (Current goals can be found in the Care Plan section)  Acute Rehab PT Goals Patient Stated Goal: get stronger and reach prior level of function PT Goal Formulation: With patient Time For Goal Achievement: 12/31/18 Potential to Achieve Goals: Fair    Frequency Min 2X/week   Barriers to discharge Decreased caregiver support      Co-evaluation               AM-PAC PT "6 Clicks" Mobility  Outcome Measure Help needed turning from your back to your side while in a flat bed without using bedrails?: A Lot Help needed moving from lying on your back to sitting  on the side of a flat bed without using bedrails?: A Lot Help needed moving to and from a bed to a chair (including a wheelchair)?: A Lot Help needed standing up from a chair using your arms (e.g., wheelchair or bedside chair)?: A Lot Help needed to walk in hospital room?: A Lot Help needed climbing 3-5 steps with a railing? : Total 6 Click Score: 11    End of Session Equipment Utilized During Treatment: Gait belt Activity Tolerance: Patient limited by fatigue;Patient tolerated treatment well Patient left: with call bell/phone within reach;in bed Nurse Communication: Mobility status(reccomendations, results of session) PT Visit Diagnosis: Unsteadiness on feet (R26.81);Difficulty in walking, not elsewhere classified (R26.2);History of falling (Z91.81);Muscle weakness (generalized) (M62.81)    Time: 9390-3009 PT Time Calculation (min) (ACUTE ONLY): 27 min   Charges:   PT Evaluation $PT Eval Moderate Complexity: 1 Mod PT Treatments $Therapeutic Activity: 8-22 mins        Everlean Alstrom. Graylon Good, PT, DPT 12/17/18, 1:25 PM

## 2018-12-17 NOTE — TOC Progression Note (Signed)
Transition of Care [facility]) - Progression Note    Patient Details  Name: Diane Mullins MRN: [id number] Date of Birth: 08-06-1933  Transition of Care [facility]) CM/SW Contact  Shelbie Hutching, RN Phone Number: 12/17/2018, 4:41 PM  Clinical Narrative:    COVID testing is being required by some facilities in the area- this patient is not suspected of having COVID testing is for SNF placement.  [facility] will isolate the patient on arrival test does not need to come back before discharge to the facility.    Expected Discharge Plan: Skilled Nursing Facility Barriers to Discharge: Insurance Authorization  Expected Discharge Plan and Services Expected Discharge Plan: [place] In-house Referral: Clinical Social Work Discharge Planning Services: CM Consult   Living arrangements for the past 2 months: [facility])                           Social Determinants of Health (SDOH) Interventions    Readmission Risk Interventions Readmission Risk Prevention Plan 12/16/2018 12/15/2018 12/14/2018  Transportation Screening Complete Complete Complete  PCP or Specialist Appt within 5-7 Days - - -  PCP or Specialist Appt within 3-5 Days Complete Patient refused -  Home Care Screening - - -  Medication Review (RN CM) - - -  [place] or Home Care Consult Complete Complete -  Social Work Consult for [place] Planning/Counseling Complete Complete -  Palliative Care Screening Not Applicable Not Applicable -  Medication Review Press photographer) Complete Complete Not Complete  Med Review Comments - - Review upon discharge  PCP or Specialist appointment within 3-5 days of discharge - - Not Complete  PCP/Specialist Appt Not Complete comments - - Review prior to discharge for appointment with cardiology  [place] or [place] - - Complete  SW Recovery Care/Counseling Consult - - Complete  [place] - -  Complete  Some recent data might be hidden

## 2018-12-17 NOTE — ED Notes (Signed)
Pt ate 75% of tray. Watching TV. No needs at this time.

## 2018-12-17 NOTE — Telephone Encounter (Signed)
Patient currently admitted. Will monitor for discharge home.

## 2018-12-17 NOTE — NC FL2 (Signed)
[place] LEVEL OF CARE SCREENING TOOL     IDENTIFICATION  Patient Name: Diane Mullins Birthdate: 12/02/1932 Sex: female Admission Date (Current Location): 12/17/2018  [place] and [place] Number:  Engineering geologist and Address:  [facility], [address] [place], [place] [address]      Provider Number: [id number]  Attending Physician Name and Address:  Merlyn Lot, MD  Relative Name and Phone Number:  [name]    Current Level of Care: Hospital Recommended Level of Care: [place] Prior Approval Number:    Date Approved/Denied:   PASRR Number: [id number]  Discharge Plan: SNF    Current Diagnoses: Patient Active Problem List   Diagnosis Date Noted  . Gastrointestinal hemorrhage with melena   . Symptomatic anemia   . Melena 12/11/2018  . Constipation 12/10/2018  . Atrial flutter ([place]) 10/17/2018  . 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 AKI (acute kidney injury) ([place])   . Acute on chronic diastolic heart failure ([place])   . Respiratory failure ([place]) 07/05/2018  . Coronary artery calcification seen on CAT scan 11/16/2017  . Morbid obesity ([place]) 10/30/2017  . (HFpEF) heart failure with preserved ejection fraction ([place]) 09/28/2017  . Family history of colon cancer 04/17/2017  . Closed right ankle fracture 10/28/2015  . Fatigue 10/18/2015  . TIA (transient ischemic attack) 09/18/2015  . Orthostatic hypotension 08/02/2015  . Adjustment disorder with anxious mood 03/30/2015  . Postmenopausal estrogen deficiency 06/03/2014  . DNR (do not resuscitate) discussion 03/04/2014  . Gait disturbance 05/07/2013  . Diabetes ([place]) 04/21/2013  . Shortness of breath 12/09/2012  . Hearing loss 12/09/2012  . Anxiety 01/31/2012  . Hypertension 12/22/2011  . Hyperlipidemia 12/22/2011  . Hypothyroidism 12/22/2011    Orientation RESPIRATION BLADDER Height & Weight     Self, Time, Situation, Place  Normal Continent  Weight: 88.9 kg Height:  5\' 2"  (157.5 cm)  BEHAVIORAL SYMPTOMS/MOOD NEUROLOGICAL BOWEL NUTRITION STATUS      Continent Diet  AMBULATORY STATUS COMMUNICATION OF NEEDS Skin   Extensive Assist Verbally Normal                       Personal Care Assistance Level of Assistance  Bathing, Dressing, Feeding Bathing Assistance: Limited assistance Feeding assistance: Independent Dressing Assistance: Limited assistance     Functional Limitations Info             SPECIAL CARE FACTORS FREQUENCY  PT (By licensed PT), OT (By licensed OT)     PT Frequency: 5 times/ week OT Frequency: 5 times/ week            Contractures Contractures Info: Not present    Additional Factors Info  Code Status, Allergies Code Status Info: Full Allergies Info: oxycodone           Current Medications (12/17/2018):  This is the current hospital active medication list Current Facility-Administered Medications  Medication Dose Route Frequency Provider Last Rate Last Dose  . clindamycin (CLEOCIN) IVPB 600 mg  600 mg Intravenous Once Merlyn Lot, MD       Current Outpatient Medications  Medication Sig Dispense Refill  . acetaminophen (TYLENOL) 325 MG tablet Take 650 mg by mouth every 6 (six) hours as needed.    Marland Kitchen amiodarone (PACERONE) 200 MG tablet Take 1 tablet (200 mg total) by mouth daily. 30 tablet 0  . aspirin EC 81 MG tablet Take 81 mg by mouth daily.    . bisacodyl (  BISACODYL LAXATIVE) 10 MG suppository Place 10 mg rectally as needed for moderate constipation.    . ferrous sulfate 325 (65 FE) MG EC tablet Take 1 tablet (325 mg total) by mouth daily with breakfast. 90 tablet 1  . furosemide (LASIX) 20 MG tablet Take 1 tablet (20 mg total) by mouth daily. 30 tablet 2  . glucose blood (ONE TOUCH ULTRA TEST) test strip Use as instructed to check Blood sugar three times a day.  E11.65 Dispense patient choice. 300 each 3  . Insulin Glargine (LANTUS SOLOSTAR) 100 UNIT/ML Solostar Pen INJECT   17 UNITS SUBCUTANEOUSLY IN THE MORNING (Patient taking differently: Inject 17 Units into the skin daily. ) 15 mL 2  . Insulin Pen Needle (PEN NEEDLES) 32G X 4 MM MISC Inject 1 Dose into the skin daily. 90 each 3  . levothyroxine (SYNTHROID, LEVOTHROID) 75 MCG tablet Take 1 tablet (75 mcg total) by mouth daily. 90 tablet 3  . metFORMIN (GLUCOPHAGE) 1000 MG tablet Take 1 tablet (1,000 mg total) by mouth 2 (two) times daily with a meal. TAKE 1 TABLET TWICE DAILY  WITH  A  MEAL (Patient taking differently: Take 1,000 mg by mouth 2 (two) times daily with a meal. ) 180 tablet 3  . omeprazole (PRILOSEC) 20 MG capsule Take 1 capsule (20 mg total) by mouth daily. 90 capsule 3  . ondansetron (ZOFRAN) 4 MG tablet Take 4 mg by mouth every 8 (eight) hours as needed for nausea.    . polyethylene glycol (MIRALAX / GLYCOLAX) packet Take 17 g by mouth daily as needed for moderate constipation.     . potassium chloride (K-DUR) 10 MEQ tablet TAKE 1 TABLET BY MOUTH EVERY DAY (Patient taking differently: Take 10 mEq by mouth daily. ) 30 tablet 1  . simvastatin (ZOCOR) 20 MG tablet Take 1 tablet (20 mg total) by mouth every evening. 30 tablet 1  . vitamin B-12 (CYANOCOBALAMIN) 1000 MCG tablet Take 1 tablet (1,000 mcg total) by mouth daily. 30 tablet 0     Discharge Medications: Please see discharge summary for a list of discharge medications.  Relevant Imaging Results:  Relevant Lab Results:   Additional Information    Shelbie Hutching, RN

## 2018-12-17 NOTE — TOC Initial Note (Signed)
Transition of Care [facility]) - Initial/Assessment Note    Patient Details  Name: Diane Mullins MRN: [id number] Date of Birth: 04/21/1933  Transition of Care [facility]) CM/SW Contact:    Shelbie Hutching, RN Phone Number: 12/17/2018, 11:32 AM  Clinical Narrative:                 Patient just discharged from the hospital yesterday brought back to the ED today for placement.  RNCM spoke with patient's daughter [name] who reports that the patient cannot care for herself, she can not even stand without assistance.   Patient was recommended for SNF before last discharge but she reported that she could not afford to pay the copay, she is in her copay days.  Referral has been sent out to local facilities.    Expected Discharge Plan: Skilled Nursing Facility Barriers to Discharge: SNF Pending bed offer   Patient Goals and CMS Choice Patient states their goals for this hospitalization and ongoing recovery are:: Daughter reports that patient cannot go home and care for herself needs facility CMS Medicare.gov Compare Post Acute Care list provided to:: Patient Represenative (must comment)(Daughter ) Choice offered to / list presented to : Adult Children  Expected Discharge Plan and Services Expected Discharge Plan: [place] In-house Referral: Clinical Social Work Discharge Planning Services: CM Consult   Living arrangements for the past 2 months: [place] Facility([facility])                          Prior Living Arrangements/Services Living arrangements for the past 2 months: [place] Facility([facility]) Lives with:: Self Patient language and need for interpreter reviewed:: Yes        Need for Family Participation in Patient Care: Yes (Comment)(unable to care for herself at home) Care giver support system in place?: Yes (comment)(daughter very supportive) Current home services: Home RN, Home PT([facility]) Criminal Activity/Legal Involvement Pertinent to  Current Situation/Hospitalization: No - Comment as needed  Activities of Daily Living      Permission Sought/Granted Permission sought to share information with : Chartered certified accountant granted to share information with : Yes, Verbal Permission Granted     Permission granted to share info w AGENCY: SNF        Emotional Assessment       Orientation: : Oriented to Self, Oriented to Place, Oriented to Situation, Oriented to  Time Alcohol / Substance Use: Not Applicable Psych Involvement: No (comment)  Admission diagnosis:  weakness Patient Active Problem List   Diagnosis Date Noted  . Gastrointestinal hemorrhage with melena   . Symptomatic anemia   . Melena 12/11/2018  . Constipation 12/10/2018  . Atrial flutter ([place]) 10/17/2018  . 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 AKI (acute kidney injury) ([place])   . Acute on chronic diastolic heart failure ([place])   . Respiratory failure ([place]) 07/05/2018  . Coronary artery calcification seen on CAT scan 11/16/2017  . Morbid obesity ([place]) 10/30/2017  . (HFpEF) heart failure with preserved ejection fraction ([place]) 09/28/2017  . Family history of colon cancer 04/17/2017  . Closed right ankle fracture 10/28/2015  . Fatigue 10/18/2015  . TIA (transient ischemic attack) 09/18/2015  . Orthostatic hypotension 08/02/2015  . Adjustment disorder with anxious mood 03/30/2015  . Postmenopausal estrogen deficiency 06/03/2014  . DNR (do not resuscitate) discussion 03/04/2014  . Gait disturbance 05/07/2013  . Diabetes ([place]) 04/21/2013  . Shortness of breath 12/09/2012  . Hearing  loss 12/09/2012  . Anxiety 01/31/2012  . Hypertension 12/22/2011  . Hyperlipidemia 12/22/2011  . Hypothyroidism 12/22/2011   PCP:  [name], MD Pharmacy:   [id number] - [place], [place] [address] [place] [address] Phone: [phone] Fax: [phone]  [facility]) - [place], [place] [address] Phone: [phone] Fax: [phone]     Social Determinants of Health (SDOH) Interventions    Readmission Risk Interventions Readmission Risk Prevention Plan 12/16/2018 12/15/2018 12/14/2018  Transportation Screening Complete Complete Complete  PCP or Specialist Appt within 5-7 Days - - -  PCP or Specialist Appt within 3-5 Days Complete Patient refused -  Home Care Screening - - -  Medication Review (RN CM) - - -  [place] or Home Care Consult Complete Complete -  Social Work Consult for [place] Planning/Counseling Complete Complete -  Palliative Care Screening Not Applicable Not Applicable -  Medication Review Press photographer) Complete Complete Not Complete  Med Review Comments - - Review upon discharge  PCP or Specialist appointment within 3-5 days of discharge - - Not Complete  PCP/Specialist Appt Not Complete comments - - Review prior to discharge for appointment with cardiology  [place] or [place] - - Complete  SW Recovery Care/Counseling Consult - - Complete  [place] - - Complete  Some recent data might be hidden

## 2018-12-17 NOTE — ED Notes (Addendum)
Per Case management pt will be placed [place].

## 2018-12-17 NOTE — TOC Progression Note (Signed)
Transition of Care [facility]) - Progression Note    Patient Details  Name: Diane Mullins MRN: [id number] Date of Birth: [birth year]  Transition [facility]) CM/SW Contact  Shelbie Hutching, RN Phone Number: 12/17/2018, 2:38 PM  Clinical Narrative:    RNCM spoke with patient's daughter Diane Mullins- Diane Mullins reports that she is the [place].  Choice presented to [place] and she chooses [facility].  [facility] has offered a bed.  Currently we are waiting on Authorization from [facility], hopefully we will be able to discharge patient tomorrow.  Patient updated on plan of care.     Expected Discharge Plan: Skilled Nursing Facility Barriers to Discharge: SNF Pending bed offer  Expected Discharge Plan and Services Expected Discharge Plan: [place] In-house Referral: Clinical Social Work Discharge Planning Services: CM Consult   Living arrangements for the past 2 months: [facility] Facility([facility])                           Social Determinants of Health (SDOH) Interventions    Readmission Risk Interventions Readmission Risk Prevention Plan 12/16/2018 12/15/2018 12/14/2018  Transportation Screening Complete Complete Complete  PCP or Specialist Appt within 5-7 Days - - -  PCP or Specialist Appt within 3-5 Days Complete Patient refused -  Home Care Screening - - -  Medication Review (RN CM) - - -  [place] or Home Care Consult Complete Complete -  Social Work Consult for [place] Planning/Counseling Complete Complete -  Palliative Care Screening Not Applicable Not Applicable -  Medication Review Press photographer) Complete Complete Not Complete  Med Review Comments - - Review upon discharge  PCP or Specialist appointment within 3-5 days of discharge - - Not Complete  PCP/Specialist Appt Not Complete comments - - Review prior to discharge for appointment with cardiology  [place] or [place] - - Complete  SW Recovery Care/Counseling Consult - - Complete   [place] - - Complete  Some recent data might be hidden

## 2018-12-17 NOTE — Telephone Encounter (Signed)
LMOV for patient to call back

## 2018-12-17 NOTE — Telephone Encounter (Signed)
Pt discharged 3/30. First attempt to follow up with transitional care management.  She is currently admitted to ED. Will follow as appropriate.

## 2018-12-17 NOTE — ED Triage Notes (Signed)
Pt from [facility] via [facility]. Per EMS pt was discharge on 3/30, per EMS pt sent here from PCP for weakness and rehab placement. EMS VSS. CBG 152. Pt pale upon arrival . EDP Robinson at bedside

## 2018-12-17 NOTE — ED Notes (Signed)
Pt updated on plan of care. Pt does not have questions at this time. This RN will continue to monitor pt.

## 2018-12-17 NOTE — ED Provider Notes (Signed)
[facility] Emergency Department Provider Note    First MD Initiated Contact with Patient 12/17/18 [phone]     (approximate)  I have reviewed the triage vital signs and the nursing notes.   HISTORY  Chief Complaint Weakness    HPI MATALYNN GRAFF is a 85 y.o. female with a history of a past medical history as listed below [facility] requiring transfusion subsequent endoscopy colonoscopy re-presents the ER for generalized weakness.  Patient states she is unable to stand due to severe weakness.  She is not having any more bloody bowel movements.  No hematemesis.  Denies any fevers.  No cough or shortness of breath.  She is still having some loose stool.   Review of medical records it does appear the patient was evaluated PT with recommendation for SNF but the patient refuses at this time.  Having gone home and now being unable to ambulate she is coming back requesting placement.   Past Medical History:  Diagnosis Date  . Atrial fibrillation ([place])   . Benign paroxysmal positional vertigo 07/17/2014  . Chronic diastolic CHF (congestive heart failure) ([place])    a. echo 10/19: EF of 60-65%, no RWMA, Gr2DD, calcified mitral annulus with mild MR, mildly to moderately dilated left atrium, moderate TR, PASP 52 mmHg  . Diabetes mellitus   . H/O: rheumatic fever   . Hyperlipidemia   . Hypertension   . Pulmonary hypertension ([place])   . Stroke [facility])    TIA Jan. 1st  . Teratoma of left ovary   . Thyroid disease    Family History  Problem Relation Age of Onset  . Heart disease Mother 41  . Heart attack Mother 59  . Cancer Father        colon  . Diabetes Sister   . Hypertension Sister   . Cancer Brother        kidney  . Diabetes Brother   . Cancer Daughter        ovarian  . Non-Hodgkin's lymphoma Son    Past Surgical History:  Procedure Laterality Date  . ABDOMINAL HYSTERECTOMY  1973   menorrhagia  . CARDIOVERSION N/A  10/22/2018   Procedure: CARDIOVERSION;  Surgeon: Nelva Bush, MD;  Location: [facility] ORS;  Service: Cardiovascular;  Laterality: N/A;  . CARDIOVERSION N/A 11/26/2018   Procedure: CARDIOVERSION (CATH LAB);  Surgeon: Minna Merritts, MD;  Location: [facility] ORS;  Service: Cardiovascular;  Laterality: N/A;  . COLONOSCOPY WITH PROPOFOL N/A 12/16/2018   Procedure: COLONOSCOPY WITH PROPOFOL;  Surgeon: Lin Landsman, MD;  Location: [facility] ENDOSCOPY;  Service: Gastroenterology;  Laterality: N/A;  . ENTEROSCOPY N/A 12/16/2018   Procedure: Push ENTEROSCOPY;  Surgeon: Lin Landsman, MD;  Location: [facility] ENDOSCOPY;  Service: Gastroenterology;  Laterality: N/A;  . ESOPHAGOGASTRODUODENOSCOPY (EGD) WITH PROPOFOL N/A 12/14/2018   Procedure: ESOPHAGOGASTRODUODENOSCOPY (EGD) WITH PROPOFOL;  Surgeon: Lucilla Lame, MD;  Location: [facility] ENDOSCOPY;  Service: Endoscopy;  Laterality: N/A;  . HERNIA REPAIR  1994  . ORIF ANKLE FRACTURE Right 10/29/2015   Procedure: OPEN REDUCTION INTERNAL FIXATION (ORIF) ANKLE FRACTURE;  Surgeon: Thornton Park, MD;  Location: [facility] ORS;  Service: Orthopedics;  Laterality: Right;  . TEE WITHOUT CARDIOVERSION N/A 10/22/2018   Procedure: TRANSESOPHAGEAL ECHOCARDIOGRAM (TEE);  Surgeon: Nelva Bush, MD;  Location: [facility] ORS;  Service: Cardiovascular;  Laterality: N/A;  . VAGINAL DELIVERY     x4   Patient Active Problem List   Diagnosis Date Noted  . Gastrointestinal hemorrhage  with melena   . Symptomatic anemia   . Melena 12/11/2018  . Constipation 12/10/2018  . Atrial flutter ([place]) 10/17/2018  . 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 AKI (acute kidney injury) ([place])   . Acute on chronic diastolic heart failure ([place])   . Respiratory failure ([place]) 07/05/2018  . Coronary artery calcification seen on CAT scan 11/16/2017  . Morbid obesity ([place]) 10/30/2017  . (HFpEF) heart failure with preserved ejection fraction ([place]) 09/28/2017  . Family history of colon cancer 04/17/2017  . Closed  right ankle fracture 10/28/2015  . Fatigue 10/18/2015  . TIA (transient ischemic attack) 09/18/2015  . Orthostatic hypotension 08/02/2015  . Adjustment disorder with anxious mood 03/30/2015  . Postmenopausal estrogen deficiency 06/03/2014  . DNR (do not resuscitate) discussion 03/04/2014  . Gait disturbance 05/07/2013  . Diabetes ([place]) 04/21/2013  . Shortness of breath 12/09/2012  . Hearing loss 12/09/2012  . Anxiety 01/31/2012  . Hypertension 12/22/2011  . Hyperlipidemia 12/22/2011  . Hypothyroidism 12/22/2011      Prior to Admission medications   Medication Sig Start Date End Date Taking? Authorizing Provider  acetaminophen (TYLENOL) 325 MG tablet Take 650 mg by mouth every 6 (six) hours as needed.    [provider]  amiodarone (PACERONE) 200 MG tablet Take 1 tablet (200 mg total) by mouth daily. 12/16/18   Bettey Costa, MD  aspirin EC 81 MG tablet Take 81 mg by mouth daily.    [provider]  bisacodyl (BISACODYL LAXATIVE) 10 MG suppository Place 10 mg rectally as needed for moderate constipation.    [provider]  ferrous sulfate 325 (65 FE) MG EC tablet Take 1 tablet (325 mg total) by mouth daily with breakfast. 08/30/18   Guse, Jacquelynn Cree, FNP  furosemide (LASIX) 20 MG tablet Take 1 tablet (20 mg total) by mouth daily. 07/10/18   Gladstone Lighter, MD  glucose blood (ONE TOUCH ULTRA TEST) test strip Use as instructed to check Blood sugar three times a day.  E11.65 Dispense patient choice. 07/30/18   Jodelle Green, FNP  Insulin Glargine (LANTUS SOLOSTAR) 100 UNIT/ML Solostar Pen INJECT  17 UNITS SUBCUTANEOUSLY IN THE MORNING Patient taking differently: Inject 17 Units into the skin daily.  08/27/18   Jodelle Green, FNP  Insulin Pen Needle (PEN NEEDLES) 32G X 4 MM MISC Inject 1 Dose into the skin daily. 12/19/16   [name], MD  levothyroxine (SYNTHROID, LEVOTHROID) 75 MCG tablet Take 1 tablet (75 mcg total) by mouth daily. 04/09/18   [name], MD  metFORMIN (GLUCOPHAGE) 1000 MG tablet Take 1 tablet (1,000 mg total) by mouth 2 (two) times daily with a meal. TAKE 1 TABLET TWICE DAILY  WITH  A  MEAL Patient taking differently: Take 1,000 mg by mouth 2 (two) times daily with a meal.  04/09/18   [name], MD  omeprazole (PRILOSEC) 20 MG capsule Take 1 capsule (20 mg total) by mouth daily. 04/09/18   [name], MD  ondansetron (ZOFRAN) 4 MG tablet Take 4 mg by mouth every 8 (eight) hours as needed for nausea. 12/04/18   [provider]  polyethylene glycol (MIRALAX / GLYCOLAX) packet Take 17 g by mouth daily as needed for moderate constipation.     [provider]  potassium chloride (K-DUR) 10 MEQ tablet TAKE 1 TABLET BY MOUTH EVERY DAY Patient taking differently: Take 10 mEq by mouth daily.  09/12/18   [name], MD  simvastatin ([place])  20 MG tablet Take 1 tablet (20 mg total) by mouth every evening. 11/28/18   Gladstone Lighter, MD  vitamin B-12 (CYANOCOBALAMIN) 1000 MCG tablet Take 1 tablet (1,000 mcg total) by mouth daily. 12/16/18   Bettey Costa, MD    Allergies Oxycodone    Social History Social History   Tobacco Use  . Smoking status: Never Smoker  . Smokeless tobacco: Never Used  Substance Use Topics  . Alcohol use: No  . Drug use: No    Review of Systems Patient denies headaches, rhinorrhea, blurry vision, numbness, shortness of breath, chest pain, edema, cough, abdominal pain, nausea, vomiting, diarrhea, dysuria, fevers, rashes or hallucinations unless otherwise stated above in HPI. ____________________________________________   PHYSICAL EXAM:  VITAL SIGNS: Vitals:   12/17/18 1100 12/17/18 1200  BP: (!) 153/64 (!) 169/78  Pulse: (!) 59 82  Resp: 18 20  Temp:    SpO2: 95% 95%    Constitutional: Alert and oriented.  Eyes: Conjunctivae are normal.  Head: Atraumatic. Nose: No congestion/rhinnorhea. Mouth/Throat: Mucous membranes are moist.   Neck: No  stridor. Painless ROM.  Cardiovascular: Normal rate, regular rhythm. Grossly normal heart sounds.  Good peripheral circulation. Respiratory: Normal respiratory effort.  No retractions. Lungs CTAB. Gastrointestinal: Soft and nontender. No distention. No abdominal bruits. No CVA tenderness. Genitourinary:  Musculoskeletal: No lower extremity tenderness nor edema.  No joint effusions. Neurologic:  Normal speech and language. No gross focal neurologic deficits are appreciated. No facial droop Skin:  Skin is warm, dry and intact. No rash noted. Psychiatric: Mood and affect are normal. Speech and behavior are normal.  ____________________________________________   LABS (all labs ordered are listed, but only abnormal results are displayed)  Results for orders placed or performed during the hospital encounter of 12/17/18 (from the past 24 hour(s))  Type and screen [place]     Status: None   Collection Time: 12/17/18 10:11 AM  Result Value Ref Range   ABO/RH(D) O POS    Antibody Screen NEG    Sample Expiration      12/20/2018 Performed at [facility], [address] [place], [place] [address]   CBC with Differential/Platelet     Status: Abnormal   Collection Time: 12/17/18 10:11 AM  Result Value Ref Range   WBC 6.7 4.0 - 10.5 K/uL   RBC 3.28 (L) 3.87 - 5.11 MIL/uL   Hemoglobin 9.2 (L) 12.0 - 15.0 g/dL   HCT 29.8 (L) 36.0 - 46.0 %   MCV 90.9 80.0 - 100.0 fL   MCH 28.0 26.0 - 34.0 pg   MCHC 30.9 30.0 - 36.0 g/dL   RDW 16.0 (H) 11.5 - 15.5 %   Platelets 342 150 - 400 K/uL   nRBC 0.0 0.0 - 0.2 %   Neutrophils Relative % 56 %   Neutro Abs 3.8 1.7 - 7.7 K/uL   Lymphocytes Relative 28 %   Lymphs Abs 1.9 0.7 - 4.0 K/uL   Monocytes Relative 12 %   Monocytes Absolute 0.8 0.1 - 1.0 K/uL   Eosinophils Relative 3 %   Eosinophils Absolute 0.2 0.0 - 0.5 K/uL   Basophils Relative 1 %   Basophils Absolute 0.0 0.0 - 0.1 K/uL   Immature Granulocytes 0 %   Abs  Immature Granulocytes 0.02 0.00 - 0.07 K/uL  Comprehensive metabolic panel     Status: Abnormal   Collection Time: 12/17/18 10:11 AM  Result Value Ref Range   Sodium 141 135 - 145 mmol/L  Potassium 4.0 3.5 - 5.1 mmol/L   Chloride 107 98 - 111 mmol/L   CO2 25 22 - 32 mmol/L   Glucose, Bld 154 (H) 70 - 99 mg/dL   BUN <5 (L) 8 - 23 mg/dL   Creatinine, Ser 0.97 0.44 - 1.00 mg/dL   Calcium 9.0 8.9 - 10.3 mg/dL   Total Protein 6.2 (L) 6.5 - 8.1 g/dL   Albumin 3.4 (L) 3.5 - 5.0 g/dL   AST 18 15 - 41 U/L   ALT 14 0 - 44 U/L   Alkaline Phosphatase 84 38 - 126 U/L   Total Bilirubin 0.7 0.3 - 1.2 mg/dL   GFR calc non Af Amer 54 (L) >60 mL/min   GFR calc Af Amer >60 >60 mL/min   Anion gap 9 5 - 15  Urinalysis, Complete w Microscopic     Status: Abnormal   Collection Time: 12/17/18 11:30 AM  Result Value Ref Range   Color, Urine STRAW (A) YELLOW   APPearance CLEAR (A) CLEAR   Specific Gravity, Urine 1.010 1.005 - 1.030   pH 5.0 5.0 - 8.0   Glucose, UA 50 (A) NEGATIVE mg/dL   Hgb urine dipstick NEGATIVE NEGATIVE   Bilirubin Urine NEGATIVE NEGATIVE   Ketones, ur NEGATIVE NEGATIVE mg/dL   Protein, ur NEGATIVE NEGATIVE mg/dL   Nitrite NEGATIVE NEGATIVE   Leukocytes,Ua NEGATIVE NEGATIVE   WBC, UA 0-5 0 - 5 WBC/hpf   Bacteria, UA NONE SEEN NONE SEEN   Squamous Epithelial / LPF NONE SEEN 0 - 5   Mucus PRESENT    Hyaline Casts, UA PRESENT    ____________________________________________  EKG My review and personal interpretation at Time: 9:56   Indication: weakness  Rate: 70  Rhythm: sinus Axis: normal Other: lvh criteria, poor r wave progression ____________________________________________  RADIOLOGY  I personally reviewed all radiographic images ordered to evaluate for the above acute complaints and reviewed radiology reports and findings.  These findings were personally discussed with the patient.  Please see medical record for radiology report.   ____________________________________________   PROCEDURES  Procedure(s) performed:  Procedures    Critical Care performed: no ____________________________________________   INITIAL IMPRESSION / ASSESSMENT AND PLAN / ED COURSE  Pertinent labs & imaging results that were available during my care of the patient were reviewed by me and considered in my medical decision making (see chart for details).   DDX: Anemia, electrolyte abnormality, GI bleed, deconditioning, dehydration  LARAY RIVKIN is a 85 y.o. who presents to the ED with symptoms as described above.  She is afebrile hemodynamically stable.  No respiratory distress.  Blood work repeated to evaluate for any changes since discharge.  Blood work is actually improved.  Will consult social work as well as case management for placement.     The patient was evaluated in Emergency Department today for the symptoms described in the history of present illness. He/she was evaluated in the context of the global COVID-19 pandemic, which necessitated consideration that the patient might be at risk for infection with the SARS-CoV-2 virus that causes COVID-19. Institutional protocols and algorithms that pertain to the evaluation of patients at risk for COVID-19 are in a state of rapid change based on information released by regulatory bodies including the CDC and federal and state organizations. These policies and algorithms were followed during the patient's care in the ED.  As part of my medical decision making, I reviewed the following data within the [place] notes reviewed  and incorporated, Labs reviewed, notes from prior ED visits and [place] Controlled Substance Database   ____________________________________________   FINAL CLINICAL IMPRESSION(S) / ED DIAGNOSES  Final diagnoses:  Weakness      NEW MEDICATIONS STARTED DURING THIS VISIT:  New Prescriptions   No medications on file     Note:  This  document was prepared using Dragon voice recognition software and may include unintentional dictation errors.    Merlyn Lot, MD 12/17/18 1212

## 2018-12-17 NOTE — Telephone Encounter (Signed)
Scheduling,  Please contact patietn when able to schedule Virtual visit with patient and go over COVID consent protocol.  Thanks!  :)

## 2018-12-17 NOTE — ED Notes (Addendum)
Verbal permission from pt to call daughter, Joni Reining [phone] to provide update on pt.

## 2018-12-17 NOTE — ED Notes (Signed)
Pt provided with dinner tray.

## 2018-12-17 NOTE — ED Notes (Signed)
Meal tray provided to pt.

## 2018-12-18 DIAGNOSIS — E291 Testicular hypofunction: Secondary | ICD-10-CM | POA: Diagnosis not present

## 2018-12-18 DIAGNOSIS — E538 Deficiency of other specified B group vitamins: Secondary | ICD-10-CM | POA: Diagnosis not present

## 2018-12-18 DIAGNOSIS — G320 Subacute combined degeneration of spinal cord in diseases classified elsewhere: Secondary | ICD-10-CM | POA: Diagnosis not present

## 2018-12-18 DIAGNOSIS — Z7401 Bed confinement status: Secondary | ICD-10-CM | POA: Diagnosis not present

## 2018-12-18 DIAGNOSIS — I1310 Hypertensive heart and chronic kidney disease without heart failure, with stage 1 through stage 4 chronic kidney disease, or unspecified chronic kidney disease: Secondary | ICD-10-CM | POA: Diagnosis not present

## 2018-12-18 DIAGNOSIS — D649 Anemia, unspecified: Secondary | ICD-10-CM | POA: Diagnosis not present

## 2018-12-18 DIAGNOSIS — R42 Dizziness and giddiness: Secondary | ICD-10-CM | POA: Diagnosis not present

## 2018-12-18 DIAGNOSIS — R531 Weakness: Secondary | ICD-10-CM | POA: Diagnosis not present

## 2018-12-18 DIAGNOSIS — K922 Gastrointestinal hemorrhage, unspecified: Secondary | ICD-10-CM | POA: Diagnosis not present

## 2018-12-18 DIAGNOSIS — I959 Hypotension, unspecified: Secondary | ICD-10-CM | POA: Diagnosis not present

## 2018-12-18 DIAGNOSIS — F0390 Unspecified dementia without behavioral disturbance: Secondary | ICD-10-CM | POA: Diagnosis not present

## 2018-12-18 DIAGNOSIS — Z9181 History of falling: Secondary | ICD-10-CM | POA: Diagnosis not present

## 2018-12-18 DIAGNOSIS — I4891 Unspecified atrial fibrillation: Secondary | ICD-10-CM | POA: Diagnosis not present

## 2018-12-18 DIAGNOSIS — D519 Vitamin B12 deficiency anemia, unspecified: Secondary | ICD-10-CM | POA: Diagnosis not present

## 2018-12-18 DIAGNOSIS — M2550 Pain in unspecified joint: Secondary | ICD-10-CM | POA: Diagnosis not present

## 2018-12-18 DIAGNOSIS — N183 Chronic kidney disease, stage 3 (moderate): Secondary | ICD-10-CM | POA: Diagnosis not present

## 2018-12-18 DIAGNOSIS — H8110 Benign paroxysmal vertigo, unspecified ear: Secondary | ICD-10-CM | POA: Diagnosis not present

## 2018-12-18 DIAGNOSIS — I5032 Chronic diastolic (congestive) heart failure: Secondary | ICD-10-CM | POA: Diagnosis not present

## 2018-12-18 NOTE — ED Provider Notes (Signed)
-----------------------------------------   6:32 AM on 12/18/2018 -----------------------------------------   Blood pressure (!) 126/49, pulse 80, temperature 97.7 F (36.5 C), temperature source Oral, resp. rate (!) 22, height 5\' 2"  (1.575 m), weight 88.9 kg, SpO2 94 %.  The patient is sleeping at this time.  There have been no acute events since the last update.  Awaiting disposition plan from Behavioral Medicine team.   Paulette Blanch, MD 12/18/18 [phone]

## 2018-12-18 NOTE — ED Notes (Signed)
Waiting on [facility] to Arrive

## 2018-12-18 NOTE — ED Notes (Signed)
Pt given meal tray. Does not eat eggs and biscuit crumbled apart. Called for new meal.  Sausage biscuit will be sent.

## 2018-12-18 NOTE — TOC Progression Note (Signed)
Transition of Care [facility]) - Progression Note    Patient Details  Name: Diane Mullins MRN: [id number] Date of Birth: 09/12/1933  Transition of Care (TOC) CM/SW Contact  G. Tania Gordon [place], LCSW  Phone Number: [phone] 12/18/2018, 9:08 AM  Clinical Narrative:    8:47am- CSW contacted [facility] to verify that they received authorization number. Rick from [facility] provided CSW with bed number (B11), and number to report [phone]). Rick asked to be notified when discharge summary is in the hub.   8:59am - CSW contacted pt's daughter [name] so she can be notified of discharge. Pt's daughter was provided with room number, and SNF phone number.   CSW/CM notified doctor and nurse of discharge, and to set up transportation.    Expected Discharge Plan: Skilled Nursing Facility Barriers to Discharge: Insurance Authorization  Expected Discharge Plan and Services Expected Discharge Plan: [place] In-house Referral: Clinical Social Work Discharge Planning Services: CM Consult   Living arrangements for the past 2 months: [facility]([place])                           Social Determinants of Health (SDOH) Interventions    Readmission Risk Interventions Readmission Risk Prevention Plan 12/16/2018 12/15/2018 12/14/2018  Transportation Screening Complete Complete Complete  PCP or Specialist Appt within 5-7 Days - - -  PCP or Specialist Appt within 3-5 Days Complete Patient refused -  Home Care Screening - - -  Medication Review (RN CM) - - -  [place] or Home Care Consult Complete Complete -  Social Work Consult for [place] Planning/Counseling Complete Complete -  Palliative Care Screening Not Applicable Not Applicable -  Medication Review Press photographer) Complete Complete Not Complete  Med Review Comments - - Review upon discharge  PCP or Specialist appointment within 3-5 days of discharge - - Not Complete  PCP/Specialist Appt Not Complete comments -  - Review prior to discharge for appointment with cardiology  [place] or [place] - - Complete  SW Recovery Care/Counseling Consult - - Complete  [place] - - Complete  Some recent data might be hidden

## 2018-12-18 NOTE — ED Notes (Signed)
Called robyn pts POA and informed has been transferred to [facility].

## 2018-12-18 NOTE — Telephone Encounter (Signed)
Patient currently admitted to ED.  Will follow as appropriate for transitional care management.

## 2018-12-18 NOTE — ED Notes (Signed)
Pt signed paper discharge consent instead of topaz

## 2018-12-19 NOTE — Telephone Encounter (Signed)
Patients daughter called to confirm she is currently in rehab at [facility]) for an extended period. Requests previously scheduled hospital follow up be cancelled at this time. Agrees to notify pcp when discharge home is expected. Follow with transitional care management as appropriate.

## 2018-12-19 NOTE — Telephone Encounter (Signed)
Appears patient may have traveled to SNF post discharge from ED on 12/18/18.  No answer on home or cell at this time regarding transitional care management. Will follow as appropriate.

## 2018-12-20 ENCOUNTER — Telehealth: Payer: Self-pay | Admitting: Emergency Medicine

## 2018-12-20 LAB — NOVEL CORONAVIRUS, NAA (HOSP ORDER, SEND-OUT TO REF LAB; TAT 18-24 HRS): SARS-CoV-2, NAA: NOT DETECTED

## 2018-12-20 NOTE — Telephone Encounter (Signed)
Called patient to inform of covid 19 results.  Left message on home number.  Cell number--message.

## 2018-12-23 ENCOUNTER — Telehealth: Payer: Self-pay

## 2018-12-23 ENCOUNTER — Inpatient Hospital Stay: Payer: PPO | Admitting: Family Medicine

## 2018-12-23 NOTE — Telephone Encounter (Signed)
Called pt and left a VM to call back. CRM created and sent to [facility] pool. 

## 2018-12-23 NOTE — Telephone Encounter (Signed)
Copied from [place] [phone]. Topic: General - Other >> Dec 23, 2018 10:55 AM Antonieta Iba C wrote: Reason for CRM: Santiago Glad with Infection Disease is calling in requesting a call back from PCP about pt.  Santiago Glad says that she would like a call back at: [phone].

## 2018-12-23 NOTE — Telephone Encounter (Signed)
Patient was exposed to a staff member that was diagnosed with [place] and exhibited symptoms during the patients hospital stay. I am being asked to contact the patient and inform them of this.  They are asking that we advised the patient to be hypervigilant and to let us know if they develop any of the following symptoms: Fever, sore throat, cough, shortness of breath, or body aches.  If the patient develops the symptoms we need to report them to infection prevention by calling [phone] or emailing at [email or web address].   I attempted to contact the patient though she did not answer either of her phone numbers.  Please try to reach out to the patient this afternoon to inform her of this.  Please advise her that if she develops any of the symptoms she needs to contact us so that we can inform infection prevention.

## 2018-12-24 NOTE — Telephone Encounter (Signed)
Called pt and left a VM to call me back on my direct number ASAP [phone].

## 2018-12-24 NOTE — Telephone Encounter (Signed)
Noted. They should still monitor her for any symptoms that are listed below and if they occur they need to let us know right away. Thanks.

## 2018-12-24 NOTE — Telephone Encounter (Signed)
Please try to call the patient today to inform her of the information in my prior message. Thanks.

## 2018-12-24 NOTE — Telephone Encounter (Signed)
[place] pt's daughter and POA stated that he mother is in rehab and this is why she has not contacted [place]. Spoke with Daughter and she stated that he mother was tested for the COVID-19 virus and it was negative and she had to have this done before she was allowed to go to the rehab facility.   If we need to call again call POA pt's daughter back at listed number above.

## 2018-12-25 ENCOUNTER — Encounter: Payer: Self-pay | Admitting: Family Medicine

## 2018-12-25 DIAGNOSIS — R42 Dizziness and giddiness: Secondary | ICD-10-CM | POA: Diagnosis not present

## 2018-12-25 DIAGNOSIS — K922 Gastrointestinal hemorrhage, unspecified: Secondary | ICD-10-CM | POA: Diagnosis not present

## 2018-12-25 DIAGNOSIS — D519 Vitamin B12 deficiency anemia, unspecified: Secondary | ICD-10-CM | POA: Diagnosis not present

## 2018-12-25 DIAGNOSIS — Z9181 History of falling: Secondary | ICD-10-CM | POA: Diagnosis not present

## 2018-12-25 DIAGNOSIS — I4891 Unspecified atrial fibrillation: Secondary | ICD-10-CM | POA: Diagnosis not present

## 2018-12-25 NOTE — Telephone Encounter (Signed)
Set to PCP

## 2018-12-25 NOTE — Telephone Encounter (Signed)
LMOV for patient to call back to schedule hospital follow up.

## 2018-12-26 ENCOUNTER — Encounter: Payer: Self-pay | Admitting: Family Medicine

## 2018-12-26 NOTE — Telephone Encounter (Signed)
Please contact the patients living facility and find out if she is currently getting any B12 supplements. Thanks.

## 2018-12-27 ENCOUNTER — Telehealth: Payer: Self-pay | Admitting: Family Medicine

## 2018-12-27 NOTE — Telephone Encounter (Signed)
Copied from [place] [phone]. Topic: Quick Communication - Rx Refill/Question >> Dec 27, 2018 11:15 AM Rainey Pines A wrote: Medication:vitamin B-12 (CYANOCOBALAMIN) 1000 MCG tablet ( Patient has already been to pharmacy twice  and medication is not there.I contacted pharmacy and the representative said that there must be a glitch in the system and this request needed to be sent to pharmacy again.)  Has the patient contacted their pharmacy? Yes (Agent: If no, request that the patient contact the pharmacy for the refill.) (Agent: If yes, when and what did the pharmacy advise?)Contact PCP  Preferred Pharmacy (with phone number or street name): [id number] [address], [place] [phone] (Phone) [phone] (Fax)    Agent: Please be advised that RX refills may take up to 3 business days. We ask that you follow-up with your pharmacy.

## 2018-12-30 ENCOUNTER — Other Ambulatory Visit: Payer: Self-pay

## 2018-12-30 MED ORDER — VITAMIN B-12 1000 MCG PO TABS: 1000.0000 ug | ORAL_TABLET | Freq: Every day | ORAL | 3 refills | Status: DC

## 2018-12-30 NOTE — Telephone Encounter (Signed)
She is at [facility] [place]

## 2018-12-30 NOTE — Telephone Encounter (Signed)
Spoke to the nurse at [facility] pt is taking b-12 supplment pill 1000 Mg once daily. Sent to PCP as an [place].

## 2018-12-30 NOTE — Telephone Encounter (Signed)
She should be on B12 injections given how low her B12 was in the hospital. Can you check with her living facility to see where we would need to send the prescription for this? Thanks.

## 2018-12-30 NOTE — Telephone Encounter (Signed)
Rx has been refilled and sent in today.

## 2018-12-31 ENCOUNTER — Other Ambulatory Visit: Payer: Self-pay

## 2018-12-31 MED ORDER — VITAMIN B-12 1000 MCG PO TABS: 1000.0000 ug | ORAL_TABLET | Freq: Every day | ORAL | 3 refills | Status: DC

## 2019-01-01 DIAGNOSIS — Z9181 History of falling: Secondary | ICD-10-CM | POA: Diagnosis not present

## 2019-01-01 DIAGNOSIS — I4891 Unspecified atrial fibrillation: Secondary | ICD-10-CM | POA: Diagnosis not present

## 2019-01-01 DIAGNOSIS — D519 Vitamin B12 deficiency anemia, unspecified: Secondary | ICD-10-CM | POA: Diagnosis not present

## 2019-01-01 DIAGNOSIS — K922 Gastrointestinal hemorrhage, unspecified: Secondary | ICD-10-CM | POA: Diagnosis not present

## 2019-01-01 DIAGNOSIS — R42 Dizziness and giddiness: Secondary | ICD-10-CM | POA: Diagnosis not present

## 2019-01-02 NOTE — Telephone Encounter (Signed)
To Dr. Gollan to review.  

## 2019-01-02 NOTE — Telephone Encounter (Signed)
After reviewing chart patient is currently at [facility].  Contacted the nursing station at [phone] to verify if it would be okay to do a Video visit with patient. Her nurse stated yes we could. Made her aware that I would fax over (ATTN: Almyra Free) a consent form and I would need someone to call back letting us know if she agreed to this visit or not.  Also made her aware we would need someone with a smart phone or an email to send the link to for the video visit.  She stated that she was just the nurse gathering the information and that I would need to call back and speak with Almyra Free at about 1:00pm today to figure out the rest.  Nurse agreed to her appointment 01/06/2019 @ 11:20am.  Made her aware that if we could not get consent or a number/email we would have to cancel the appointment.

## 2019-01-02 NOTE — Telephone Encounter (Signed)
Called and spoke with Almyra Free. [phone]) from Nursing home/ rehab center. Made her aware that Dr. Rockey Situ would like to do a video visit with patient.  She stated that there has been nothing wrong with the patients heart since they got her on 12/17/2018 except very bad Vertigo. Almyra Free stated that they are doing PT with her to help with that.  She stated she has not had any tachycardia spells and the patient has not had any orthostatic readings. She stated that she does not know how the doctor plans on doing a visit with the patient.

## 2019-01-04 NOTE — Progress Notes (Deleted)
{Choose 1 Note Type (Telehealth Visit or Telephone Visit):[phone]}   I connected with  Diane Mullins on 01/04/19 by a video enabled telemedicine application and verified that I am speaking with the correct person using two identifiers. I discussed the limitations of evaluation and management by telemedicine. The patient expressed understanding and agreed to proceed.   Evaluation Performed:  Follow-up visit  Date:  01/04/2019   ID:  Diane Mullins, DOB 04-26-1933, MRN [id number]  Patient Location:  [address] [place] [address]   Provider location:   [facility] [place] office  PCP:  [name], MD  Cardiologist:  Arvid Right [facility]   Chief Complaint:      History of Present Illness:    Diane Mullins is a 85 y.o. female who presents via audio/video conferencing for a telehealth visit today.   The patient does not symptoms concerning for COVID-19 infection (fever, chills, cough, or new SHORTNESS OF BREATH).   Patient has a past medical history of 85 year-old female with history of  diabetes,  obesity,  Hypertension, hyperlipidemia,  gait instability  tachycardia  who presents for routine followup of her tachycardia, shortness of breath and weakness  She reports doing well in general, no regular physical activity Weight continues to trend upwards  Episode of significant SOB 09/24/2017 Woke up middle of the night , 2 or 3 in the morning,  Went to the emergency room for further evaluation  CT chest for elevated d-dimer BNP 250 Was told she might have extra fluid  Reports that she takes Lasix 10 mg daily, sometimes misses days  Eats out periodically High fluids   denies excessive lower extremity edema, sometimes mild ankle swelling Sits around much of the day  CT scan images pulled up in the office and reviewed with her in detail showing heavy coronary calcification, LAD, RCA  mild aortic arch atherosclerosis  Chronic  leg weakness Difficult time controlling her sugars. Some dietary noncompliance  chronic shortness of breath with exertion, walks with a walker  Previous echocardiogram was essentially normal with ejection fraction greater than 55%.    Prior CV studies:   The following studies were reviewed today:    Past Medical History:  Diagnosis Date  . Atrial fibrillation ([place])   . Benign paroxysmal positional vertigo 07/17/2014  . Chronic diastolic CHF (congestive heart failure) ([place])    a. echo 10/19: EF of 60-65%, no RWMA, Gr2DD, calcified mitral annulus with mild MR, mildly to moderately dilated left atrium, moderate TR, PASP 52 mmHg  . Diabetes mellitus   . H/O: rheumatic fever   . Hyperlipidemia   . Hypertension   . Pulmonary hypertension ([place])   . Stroke [facility])    TIA Jan. 1st  . Teratoma of left ovary   . Thyroid disease    Past Surgical History:  Procedure Laterality Date  . ABDOMINAL HYSTERECTOMY  1973   menorrhagia  . CARDIOVERSION N/A 10/22/2018   Procedure: CARDIOVERSION;  Surgeon: Nelva Bush, MD;  Location: [facility] ORS;  Service: Cardiovascular;  Laterality: N/A;  . CARDIOVERSION N/A 11/26/2018   Procedure: CARDIOVERSION (CATH LAB);  Surgeon: Minna Merritts, MD;  Location: [facility] ORS;  Service: Cardiovascular;  Laterality: N/A;  . COLONOSCOPY WITH PROPOFOL N/A 12/16/2018   Procedure: COLONOSCOPY WITH PROPOFOL;  Surgeon: Lin Landsman, MD;  Location: [facility] ENDOSCOPY;  Service: Gastroenterology;  Laterality: N/A;  . ENTEROSCOPY N/A 12/16/2018   Procedure: Push ENTEROSCOPY;  Surgeon: Sherri Sear  Reece Levy, MD;  Location: [place];  Service: Gastroenterology;  Laterality: N/A;  . ESOPHAGOGASTRODUODENOSCOPY (EGD) WITH PROPOFOL N/A 12/14/2018   Procedure: ESOPHAGOGASTRODUODENOSCOPY (EGD) WITH PROPOFOL;  Surgeon: Lucilla Lame, MD;  Location: [facility] ENDOSCOPY;  Service: Endoscopy;  Laterality: N/A;  . HERNIA REPAIR  1994  . ORIF ANKLE FRACTURE Right 10/29/2015   Procedure:  OPEN REDUCTION INTERNAL FIXATION (ORIF) ANKLE FRACTURE;  Surgeon: Thornton Park, MD;  Location: [facility] ORS;  Service: Orthopedics;  Laterality: Right;  . TEE WITHOUT CARDIOVERSION N/A 10/22/2018   Procedure: TRANSESOPHAGEAL ECHOCARDIOGRAM (TEE);  Surgeon: Nelva Bush, MD;  Location: [facility] ORS;  Service: Cardiovascular;  Laterality: N/A;  . VAGINAL DELIVERY     x4     No outpatient medications have been marked as taking for the 01/06/19 encounter (Appointment) with Minna Merritts, MD.     Allergies:   Oxycodone   Social History   Tobacco Use  . Smoking status: Never Smoker  . Smokeless tobacco: Never Used  Substance Use Topics  . Alcohol use: No  . Drug use: No     Current Outpatient Medications on File Prior to Visit  Medication Sig Dispense Refill  . acetaminophen (TYLENOL) 325 MG tablet Take 650 mg by mouth every 6 (six) hours as needed for mild pain or moderate pain.     Marland Kitchen amiodarone (PACERONE) 200 MG tablet Take 1 tablet (200 mg total) by mouth daily. 30 tablet 0  . aspirin EC 81 MG tablet Take 81 mg by mouth daily.    . bisacodyl (BISACODYL LAXATIVE) 10 MG suppository Place 10 mg rectally as needed for moderate constipation.    . ferrous sulfate 325 (65 FE) MG EC tablet Take 1 tablet (325 mg total) by mouth daily with breakfast. 90 tablet 1  . furosemide (LASIX) 20 MG tablet Take 1 tablet (20 mg total) by mouth daily. 30 tablet 2  . glucose blood (ONE TOUCH ULTRA TEST) test strip Use as instructed to check Blood sugar three times a day.  E11.65 Dispense patient choice. 300 each 3  . Insulin Glargine (LANTUS SOLOSTAR) 100 UNIT/ML Solostar Pen INJECT  17 UNITS SUBCUTANEOUSLY IN THE MORNING (Patient taking differently: Inject 17 Units into the skin daily. ) 15 mL 2  . Insulin Pen Needle (PEN NEEDLES) 32G X 4 MM MISC Inject 1 Dose into the skin daily. 90 each 3  . levothyroxine (SYNTHROID, LEVOTHROID) 75 MCG tablet Take 1 tablet (75 mcg total) by mouth daily. 90 tablet 3  .  metFORMIN (GLUCOPHAGE) 1000 MG tablet Take 1 tablet (1,000 mg total) by mouth 2 (two) times daily with a meal. TAKE 1 TABLET TWICE DAILY  WITH  A  MEAL (Patient taking differently: Take 1,000 mg by mouth 2 (two) times daily with a meal. ) 180 tablet 3  . omeprazole (PRILOSEC) 20 MG capsule Take 1 capsule (20 mg total) by mouth daily. 90 capsule 3  . ondansetron (ZOFRAN) 4 MG tablet Take 4 mg by mouth every 8 (eight) hours as needed for nausea.    . polyethylene glycol (MIRALAX / GLYCOLAX) packet Take 17 g by mouth daily as needed for moderate constipation.     . potassium chloride (K-DUR) 10 MEQ tablet TAKE 1 TABLET BY MOUTH EVERY DAY (Patient taking differently: Take 10 mEq by mouth daily. ) 30 tablet 1  . simvastatin (ZOCOR) 20 MG tablet Take 1 tablet (20 mg total) by mouth every evening. 30 tablet 1  . vitamin B-12 (CYANOCOBALAMIN) 1000 MCG tablet Take 1 tablet (  1,000 mcg total) by mouth daily. 30 tablet 3   No current facility-administered medications on file prior to visit.      Family Hx: The patient's family history includes Cancer in her brother, daughter, and father; Diabetes in her brother and sister; Heart attack (age of onset: 64) in her mother; Heart disease (age of onset: 33) in her mother; Hypertension in her sister; Non-Hodgkin's lymphoma in her son.  ROS:   Please see the history of present illness.    ROS    Labs/Other Tests and Data Reviewed:    Recent Labs: 10/17/2018: TSH 1.862 11/25/2018: B Natriuretic Peptide 759.0 12/13/2018: Magnesium 1.9 12/17/2018: ALT 14; BUN <5; Creatinine, Ser 0.97; Hemoglobin 9.2; Platelets 342; Potassium 4.0; Sodium 141   Recent Lipid Panel Lab Results  Component Value Date/Time   CHOL 130 07/06/2018 07:20 AM   CHOL 177 09/15/2015 10:00 AM   TRIG 107 07/06/2018 07:20 AM   HDL 35 (L) 07/06/2018 07:20 AM   HDL 54 09/15/2015 10:00 AM   CHOLHDL 3.7 07/06/2018 07:20 AM   LDLCALC 74 07/06/2018 07:20 AM   LDLCALC 88 09/15/2015 10:00 AM    LDLDIRECT 80.0 04/09/2018 11:19 AM    Wt Readings from Last 3 Encounters:  12/17/18 195 lb 15.8 oz (88.9 kg)  12/11/18 196 lb 1.6 oz (89 kg)  11/28/18 204 lb 8 oz (92.8 kg)     Exam:    Vital Signs: Vital signs may also be detailed in the HPI There were no vitals taken for this visit.  Wt Readings from Last 3 Encounters:  12/17/18 195 lb 15.8 oz (88.9 kg)  12/11/18 196 lb 1.6 oz (89 kg)  11/28/18 204 lb 8 oz (92.8 kg)   Temp Readings from Last 3 Encounters:  12/18/18 97.9 F (36.6 C) (Oral)  12/16/18 (!) 97 F (36.1 C) (Tympanic)  12/10/18 97.8 F (36.6 C) (Oral)   BP Readings from Last 3 Encounters:  12/18/18 (!) 143/55  12/16/18 (!) 131/51  12/10/18 140/66   Pulse Readings from Last 3 Encounters:  12/18/18 76  12/16/18 94  12/10/18 70     Well nourished, well developed female in no acute distress. Constitutional:  oriented to person, place, and time. No distress.  Head: Normocephalic and atraumatic.  Eyes:  no discharge. No scleral icterus.  Neck: Normal range of motion. Neck supple.  Pulmonary/Chest: No audible wheezing, no distress, appears comfortable Musculoskeletal: Normal range of motion.  no  tenderness or deformity.  Neurological:   Coordination normal. Full exam not performed Skin:  No rash Psychiatric:  normal mood and affect. behavior is normal. Thought content normal.    ASSESSMENT & PLAN:    No diagnosis found.   COVID-19 Education: The signs and symptoms of COVID-19 were discussed with the patient and how to seek care for testing (follow up with PCP or arrange E-visit).  The importance of social distancing was discussed today.  Patient Risk:   After full review of this patients clinical status, I feel that they are at least moderate risk at this time.  Time:   Today, I have spent 25 minutes with the patient with telehealth technology discussing the cardiac and medical problems/diagnoses detailed above   10 min spent reviewing the chart  prior to patient visit today   Medication Adjustments/Labs and Tests Ordered: Current medicines are reviewed at length with the patient today.  Concerns regarding medicines are outlined above.   Tests Ordered: No tests ordered   Medication Changes: No changes  made   Disposition: Follow-up in 6 months   Signed, Ida Rogue, MD  01/04/2019 1:44 PM    [place] Office [address] [place] [address], [place], [place] [address]

## 2019-01-06 ENCOUNTER — Telehealth: Payer: PPO | Admitting: Cardiovascular Disease

## 2019-01-06 ENCOUNTER — Other Ambulatory Visit: Payer: Self-pay

## 2019-01-10 ENCOUNTER — Telehealth: Payer: Self-pay | Admitting: Family Medicine

## 2019-01-10 NOTE — Telephone Encounter (Signed)
Placed in RED folder to sign  

## 2019-01-10 NOTE — Telephone Encounter (Signed)
Pt's daughter dropped off pt's handicapp renewal form to be signed. Form is up front in color folder.

## 2019-01-11 NOTE — Telephone Encounter (Signed)
Completed.  Please make available for pickup. 

## 2019-01-13 NOTE — Telephone Encounter (Signed)
Called and spoke with pt's daughter. Daughter advised form is ready for pick up. Placed up front.

## 2019-01-22 ENCOUNTER — Other Ambulatory Visit: Payer: Self-pay | Admitting: Family Medicine

## 2019-01-23 ENCOUNTER — Other Ambulatory Visit: Payer: Self-pay | Admitting: Family Medicine

## 2019-02-04 DIAGNOSIS — E538 Deficiency of other specified B group vitamins: Secondary | ICD-10-CM | POA: Diagnosis not present

## 2019-02-04 DIAGNOSIS — G320 Subacute combined degeneration of spinal cord in diseases classified elsewhere: Secondary | ICD-10-CM | POA: Diagnosis not present

## 2019-02-04 DIAGNOSIS — R42 Dizziness and giddiness: Secondary | ICD-10-CM | POA: Diagnosis not present

## 2019-02-17 DIAGNOSIS — I5032 Chronic diastolic (congestive) heart failure: Secondary | ICD-10-CM | POA: Diagnosis not present

## 2019-02-19 DIAGNOSIS — K922 Gastrointestinal hemorrhage, unspecified: Secondary | ICD-10-CM | POA: Diagnosis not present

## 2019-02-19 DIAGNOSIS — I4891 Unspecified atrial fibrillation: Secondary | ICD-10-CM | POA: Diagnosis not present

## 2019-02-19 DIAGNOSIS — E538 Deficiency of other specified B group vitamins: Secondary | ICD-10-CM | POA: Diagnosis not present

## 2019-02-19 DIAGNOSIS — D649 Anemia, unspecified: Secondary | ICD-10-CM | POA: Diagnosis not present

## 2019-02-19 DIAGNOSIS — H8110 Benign paroxysmal vertigo, unspecified ear: Secondary | ICD-10-CM | POA: Diagnosis not present

## 2019-03-17 ENCOUNTER — Ambulatory Visit: Payer: PPO | Admitting: Family Medicine

## 2019-03-17 ENCOUNTER — Ambulatory Visit: Payer: PPO

## 2019-03-24 ENCOUNTER — Other Ambulatory Visit: Payer: Self-pay | Admitting: Family Medicine

## 2019-03-24 ENCOUNTER — Telehealth: Payer: Self-pay | Admitting: *Deleted

## 2019-03-24 NOTE — Telephone Encounter (Signed)
Copied from [place] [id number]. Topic: General - Inquiry >> Mar 24, 2019  4:31 PM Jeri Cos wrote: Reason for CRM: Gery Pray called stating that she faxed orders over to the office on 6/26 and 7/1 and has not gotten a response back. She will be faxing over the order again. She stated that it's very important that she receives a call back.

## 2019-03-25 ENCOUNTER — Other Ambulatory Visit: Payer: Self-pay | Admitting: Family Medicine

## 2019-03-25 NOTE — Telephone Encounter (Signed)
Noted. Please keep trying to get in touch with her regarding this. If you can't get in touch with her after a couple more calls please send a letter. Thanks.

## 2019-03-25 NOTE — Telephone Encounter (Signed)
Please contact the patient and see what issue she is having with constipation.

## 2019-03-25 NOTE — Telephone Encounter (Signed)
Called [employer] at [employer] and left a voicemail informing the company that the order for the patient was received and the doctor will be out of the office all week and upon his return he will sign the form and it will be faxed to their office.   Nina,cma

## 2019-03-26 ENCOUNTER — Telehealth: Payer: Self-pay

## 2019-03-26 NOTE — Telephone Encounter (Signed)
I called and spoke with the RN and she states they could not take a verbal they need the form with the signature, I informed her that the provider would not be back until Friday, I will see is L. guse will sign the form tomorrow, 03/27/2019.  Nina,cma

## 2019-03-26 NOTE — Telephone Encounter (Signed)
[facility] was called and the Med tech states she could not take a verbal order I have to give that verbal order to the RN, she will not be in until 3 pm.  I will call back.  Geraldean Walen,cma

## 2019-03-26 NOTE — Telephone Encounter (Signed)
Called and spoke with Diane Mullins and informed her that I spoke with the RN to give a verbal order and they cannot take a verbal and she agreed I informed her that the provider will return to work on Friday and I will have him sign the form and I will fax it to her. She understood and stated she would relay that to the patient.  Hunter Bachar,cma

## 2019-03-26 NOTE — Telephone Encounter (Signed)
Pt called and is concerned that she cannot get her PT started. Pt states she has been there since 6/23 and concerned she is going to lose all her strength in her legs if she cannot get her PT started.  Advised dr not in the office to sign.  She asked if someone else could sign

## 2019-03-26 NOTE — Telephone Encounter (Signed)
Copied from [place] [id number]. Topic: General - Other >> Mar 26, 2019  8:40 AM Keene Breath wrote: Reason for CRM: Patient called to ask that the nurse call her assisted living facility to let them know that she does not need to take Murelax everyday and she does not want to take it everyday.  Please advise and call her assisted living facility to let them know.

## 2019-03-26 NOTE — Telephone Encounter (Signed)
Please place the form on my keyboard and I will sign on Friday. Thanks.

## 2019-03-26 NOTE — Telephone Encounter (Signed)
She can take the miralax daily as needed for constipation. Please call them to give them verbal orders for this. Thanks.

## 2019-03-26 NOTE — Telephone Encounter (Signed)
Patient stated that she did need the suppositories noir did she want them. She is at [facility] & doesn't know if they would allow her to leave for an appointment. I suggested a phone call. Patient stated that she really wanted to be seen in person. I told her I didn't know that was possible. She said that she would call back to let us know if she needed to cancel.

## 2019-03-26 NOTE — Telephone Encounter (Signed)
Copied from [place] [id number]. Topic: General - Other >> Mar 26, 2019  3:51 PM Leward Quan A wrote: Reason for CRM: Gery Pray [facility]) called to request status on fax that was sent over several different times to request orders for Physical and Occupational Therapy asking for orders to be faxed over to [place] assisted living facility Fax# [phone] and can call Ph# [phone] ok to LM.

## 2019-03-28 ENCOUNTER — Other Ambulatory Visit: Payer: Self-pay

## 2019-03-28 ENCOUNTER — Other Ambulatory Visit: Payer: Self-pay | Admitting: Family Medicine

## 2019-03-28 NOTE — Telephone Encounter (Signed)
There are 2 forms for this patient in the quick sign folder for you to sign.  Diane Mullins,cma

## 2019-03-28 NOTE — Telephone Encounter (Signed)
Signed and placed in signed folder.

## 2019-03-28 NOTE — Telephone Encounter (Signed)
Faxed to facility today.  Binta Statzer,cma

## 2019-03-31 NOTE — Telephone Encounter (Signed)
A letter was faxed about this to the facility last week.  Hilery Wintle,cma

## 2019-03-31 NOTE — Telephone Encounter (Signed)
I faxed over the other orders and another one came today it is in the green folder.  ,cma

## 2019-04-01 ENCOUNTER — Ambulatory Visit (INDEPENDENT_AMBULATORY_CARE_PROVIDER_SITE_OTHER): Payer: PPO

## 2019-04-01 ENCOUNTER — Ambulatory Visit (INDEPENDENT_AMBULATORY_CARE_PROVIDER_SITE_OTHER): Payer: PPO | Admitting: Family Medicine

## 2019-04-01 ENCOUNTER — Encounter: Payer: Self-pay | Admitting: Family Medicine

## 2019-04-01 ENCOUNTER — Other Ambulatory Visit: Payer: Self-pay

## 2019-04-01 VITALS — BP 130/70 | HR 67 | Temp 98.6°F | Ht 61.0 in | Wt 191.1 lb

## 2019-04-01 VITALS — BP 130/70 | HR 67 | Temp 98.6°F | Resp 17 | Ht 61.0 in | Wt 191.0 lb

## 2019-04-01 DIAGNOSIS — Z0000 Encounter for general adult medical examination without abnormal findings: Secondary | ICD-10-CM

## 2019-04-01 DIAGNOSIS — E039 Hypothyroidism, unspecified: Secondary | ICD-10-CM

## 2019-04-01 DIAGNOSIS — K921 Melena: Secondary | ICD-10-CM | POA: Diagnosis not present

## 2019-04-01 DIAGNOSIS — I483 Typical atrial flutter: Secondary | ICD-10-CM

## 2019-04-01 DIAGNOSIS — E538 Deficiency of other specified B group vitamins: Secondary | ICD-10-CM

## 2019-04-01 DIAGNOSIS — E1122 Type 2 diabetes mellitus with diabetic chronic kidney disease: Secondary | ICD-10-CM

## 2019-04-01 DIAGNOSIS — N183 Chronic kidney disease, stage 3 (moderate): Secondary | ICD-10-CM

## 2019-04-01 DIAGNOSIS — R2689 Other abnormalities of gait and mobility: Secondary | ICD-10-CM | POA: Diagnosis not present

## 2019-04-01 DIAGNOSIS — Z794 Long term (current) use of insulin: Secondary | ICD-10-CM

## 2019-04-01 LAB — CBC
HCT: 32.5 % — ABNORMAL LOW (ref 36.0–46.0)
Hemoglobin: 10.2 g/dL — ABNORMAL LOW (ref 12.0–15.0)
MCHC: 31.3 g/dL (ref 30.0–36.0)
MCV: 81.4 fl (ref 78.0–100.0)
Platelets: 278 10*3/uL (ref 150.0–400.0)
RBC: 3.99 Mil/uL (ref 3.87–5.11)
RDW: 17.1 % — ABNORMAL HIGH (ref 11.5–15.5)
WBC: 6.3 10*3/uL (ref 4.0–10.5)

## 2019-04-01 LAB — COMPREHENSIVE METABOLIC PANEL
ALT: 10 U/L (ref 0–35)
AST: 13 U/L (ref 0–37)
Albumin: 4 g/dL (ref 3.5–5.2)
Alkaline Phosphatase: 75 U/L (ref 39–117)
BUN: 13 mg/dL (ref 6–23)
CO2: 31 mEq/L (ref 19–32)
Calcium: 8.9 mg/dL (ref 8.4–10.5)
Chloride: 100 mEq/L (ref 96–112)
Creatinine, Ser: 1.02 mg/dL (ref 0.40–1.20)
GFR: 51.56 mL/min — ABNORMAL LOW (ref >60.00)
Glucose, Bld: 121 mg/dL — ABNORMAL HIGH (ref 70–99)
Potassium: 4.5 mEq/L (ref 3.5–5.1)
Sodium: 140 mEq/L (ref 135–145)
Total Bilirubin: 0.2 mg/dL (ref 0.2–1.2)
Total Protein: 6.2 g/dL (ref 6.0–8.3)

## 2019-04-01 LAB — TSH: TSH: 4.5 u[IU]/mL (ref 0.35–4.50)

## 2019-04-01 LAB — VITAMIN B12: Vitamin B-12: 94 pg/mL — ABNORMAL LOW (ref 211–911)

## 2019-04-01 LAB — HEMOGLOBIN A1C: Hgb A1c MFr Bld: 6.8 % — ABNORMAL HIGH (ref 4.6–6.5)

## 2019-04-01 LAB — IBC + FERRITIN
Ferritin: 15.5 ng/mL (ref 10.0–291.0)
Iron: 80 ug/dL (ref 42–145)
Saturation Ratios: 20.1 % (ref 20.0–50.0)
Transferrin: 284 mg/dL (ref 212.0–360.0)

## 2019-04-01 NOTE — Patient Instructions (Addendum)
  Ms. Nall , Thank you for taking time to come for your Medicare Wellness Visit. I appreciate your ongoing commitment to your health goals. Please review the following plan we discussed and let me know if I can assist you in the future.   These are the goals we discussed: Goals      Patient Stated   . Increase physical activity (pt-stated)     Start physical therapy and see it through to the end. Then continue practicing when released.       This is a list of the screening recommended for you and due dates:  Health Maintenance  Topic Date Due  . Urine Protein Check  09/14/2016  . Eye exam for diabetics  06/05/2018  . Complete foot exam   01/02/2019  . Hemoglobin A1C  04/17/2019  . Flu Shot  04/19/2019  . Tetanus Vaccine  06/18/2021  . DEXA scan (bone density measurement)  Completed  . Pneumonia vaccines  Completed

## 2019-04-01 NOTE — Progress Notes (Addendum)
Subjective:   Diane [place] is a 85 y.o. female who presents for Medicare Annual (Subsequent) preventive examination.  Review of Systems:  No ROS.  Medicare Wellness Visit. See social history for additional risk factors.   Cardiac Risk Factors include: advanced age (>67men, >72 women);hypertension;diabetes mellitus     Objective:     Vitals: BP 130/70 (Patient Position: Sitting, Cuff Size: Normal)   Pulse 67   Temp 98.6 F (37 C) (Oral)   Resp 17   Ht 5\' 1"  (1.549 m)   Wt 191 lb (86.6 kg)   SpO2 97%   BMI 36.09 kg/m   Body mass index is 36.09 kg/m.  Advanced Directives 04/01/2019 12/17/2018 12/11/2018 12/11/2018 11/26/2018 11/26/2018 11/25/2018  Does Patient Have a Medical Advance Directive? Yes Yes Yes Yes - Yes No  Type of Advance Directive [place];Living will Living will;Healthcare Power of [place];Living will [place];Living will - [place] -  Does patient want to make changes to medical advance directive? No - Patient declined - No - Patient declined - No - Patient declined No - Patient declined -  Copy of [place] in Chart? Yes - validated most recent copy scanned in chart (See row information) No - copy requested No - copy requested No - copy requested - No - copy requested -  Would patient like information on creating a medical advance directive? - No - Patient declined No - Patient declined No - Patient declined - No - Patient declined No - Patient declined    Tobacco Social History   Tobacco Use  Smoking Status Never Smoker  Smokeless Tobacco Never Used     Counseling given: Not Answered   Clinical Intake:  Pre-visit preparation completed: Yes           How often do you need to have someone help you when you read instructions, pamphlets, or other written materials from your doctor or pharmacy?: 1 - Never  Interpreter Needed?: No     Past  Medical History:  Diagnosis Date  . Atrial fibrillation ([place])   . Benign paroxysmal positional vertigo 07/17/2014  . Chronic diastolic CHF (congestive heart failure) ([place])    a. echo 10/19: EF of 60-65%, no RWMA, Gr2DD, calcified mitral annulus with mild MR, mildly to moderately dilated left atrium, moderate TR, PASP 52 mmHg  . Diabetes mellitus   . H/O: rheumatic fever   . Hyperlipidemia   . Hypertension   . Pulmonary hypertension ([place])   . Stroke [facility])    TIA Jan. 1st  . Teratoma of left ovary   . Thyroid disease    Past Surgical History:  Procedure Laterality Date  . ABDOMINAL HYSTERECTOMY  1973   menorrhagia  . CARDIOVERSION N/A 10/22/2018   Procedure: CARDIOVERSION;  Surgeon: Nelva Bush, MD;  Location: [facility] ORS;  Service: Cardiovascular;  Laterality: N/A;  . CARDIOVERSION N/A 11/26/2018   Procedure: CARDIOVERSION (CATH LAB);  Surgeon: Minna Merritts, MD;  Location: [facility] ORS;  Service: Cardiovascular;  Laterality: N/A;  . COLONOSCOPY WITH PROPOFOL N/A 12/16/2018   Procedure: COLONOSCOPY WITH PROPOFOL;  Surgeon: Lin Landsman, MD;  Location: [facility] ENDOSCOPY;  Service: Gastroenterology;  Laterality: N/A;  . ENTEROSCOPY N/A 12/16/2018   Procedure: Push ENTEROSCOPY;  Surgeon: Lin Landsman, MD;  Location: [facility] ENDOSCOPY;  Service: Gastroenterology;  Laterality: N/A;  . ESOPHAGOGASTRODUODENOSCOPY (EGD) WITH PROPOFOL N/A 12/14/2018   Procedure: ESOPHAGOGASTRODUODENOSCOPY (EGD) WITH PROPOFOL;  Surgeon: Lucilla Lame, MD;  Location: [facility] ENDOSCOPY;  Service: Endoscopy;  Laterality: N/A;  . HERNIA REPAIR  1994  . ORIF ANKLE FRACTURE Right 10/29/2015   Procedure: OPEN REDUCTION INTERNAL FIXATION (ORIF) ANKLE FRACTURE;  Surgeon: Thornton Park, MD;  Location: [facility] ORS;  Service: Orthopedics;  Laterality: Right;  . TEE WITHOUT CARDIOVERSION N/A 10/22/2018   Procedure: TRANSESOPHAGEAL ECHOCARDIOGRAM (TEE);  Surgeon: Nelva Bush, MD;  Location: [facility] ORS;  Service:  Cardiovascular;  Laterality: N/A;  . VAGINAL DELIVERY     x4   Family History  Problem Relation Age of Onset  . Heart disease Mother 53  . Heart attack Mother 53  . Cancer Father        colon  . Diabetes Sister   . Hypertension Sister   . Cancer Brother        kidney  . Diabetes Brother   . Cancer Daughter        ovarian  . Non-Hodgkin's lymphoma Son    Social History   Socioeconomic History  . Marital status: Widowed    Spouse name: Not on file  . Number of children: 4  . Years of education: Not on file  . Highest education level: Not on file  Occupational History  . Not on file  Social Needs  . Financial resource strain: Not hard at all  . Food insecurity    Worry: Never true    Inability: Never true  . Transportation needs    Medical: No    Non-medical: No  Tobacco Use  . Smoking status: Never Smoker  . Smokeless tobacco: Never Used  Substance and Sexual Activity  . Alcohol use: No  . Drug use: No  . Sexual activity: Never  Lifestyle  . Physical activity    Days per week: 0 days    Minutes per session: 0 min  . Stress: Only a little  Relationships  . Social connections    Talks on phone: More than three times a week    Gets together: More than three times a week    Attends religious service: More than 4 times per year    Active member of club or organization: No    Attends meetings of clubs or organizations: Never    Relationship status: Widowed  Other Topics Concern  . Not on file  Social History Narrative   Lives in [place] alone. No pets. Retired from [employer].   Diet: healthy   Exercise: water aerobics twice weekly, balance class   Ambulates with a walker at baseline    Outpatient Encounter Medications as of 04/01/2019  Medication Sig  . acetaminophen (TYLENOL) 325 MG tablet Take 650 mg by mouth every 6 (six) hours as needed for mild pain or moderate pain.   Marland Kitchen amiodarone (PACERONE) 200 MG tablet Take 1 tablet (200 mg total) by mouth daily.  Marland Kitchen  aspirin EC 81 MG tablet Take 81 mg by mouth daily.  . bisacodyl (BISACODYL LAXATIVE) 10 MG suppository Place 10 mg rectally as needed for moderate constipation.  . [employer] VITAMIN B12 1000 MCG tablet TAKE 1 TABLET BY MOUTH EVERY DAY  . ferrous sulfate 325 (65 FE) MG EC tablet Take 1 tablet (325 mg total) by mouth daily with breakfast.  . furosemide (LASIX) 20 MG tablet TAKE ONE TABLET BY MOUTH ONCE DAILY  . glucose blood (ONE TOUCH ULTRA TEST) test strip Use as instructed to check Blood sugar three times a day.  E11.65 Dispense patient choice.  . Insulin  Glargine (LANTUS SOLOSTAR) 100 UNIT/ML Solostar Pen INJECT  17 UNITS SUBCUTANEOUSLY IN THE MORNING (Patient taking differently: Inject 17 Units into the skin daily. )  . Insulin Pen Needle (PEN NEEDLES) 32G X 4 MM MISC Inject 1 Dose into the skin daily.  Marland Kitchen levothyroxine (SYNTHROID) 100 MCG tablet TAKE 1 TABLET BY MOUTH 30-60 MINUTES BEFORE BREAKFAST ON AN EMPTY STOMACH FOR THYROID REPLACEMENT  . levothyroxine (SYNTHROID, LEVOTHROID) 75 MCG tablet Take 1 tablet (75 mcg total) by mouth daily.  . metFORMIN (GLUCOPHAGE) 1000 MG tablet Take 1 tablet (1,000 mg total) by mouth 2 (two) times daily with a meal. TAKE 1 TABLET TWICE DAILY  WITH  A  MEAL (Patient taking differently: Take 1,000 mg by mouth 2 (two) times daily with a meal. )  . omeprazole (PRILOSEC) 20 MG capsule Take 1 capsule (20 mg total) by mouth daily.  . ondansetron (ZOFRAN) 4 MG tablet Take 4 mg by mouth every 8 (eight) hours as needed for nausea.  . polyethylene glycol (MIRALAX / GLYCOLAX) packet Take 17 g by mouth daily as needed for moderate constipation.   . potassium chloride (K-DUR) 10 MEQ tablet TAKE 1 TABLET BY MOUTH EVERY DAY FOR SUPPLEMENT  . simvastatin (ZOCOR) 20 MG tablet Take 1 tablet (20 mg total) by mouth every evening.   No facility-administered encounter medications on file as of 04/01/2019.     Activities of Daily Living In your present state of health, do you have any  difficulty performing the following activities: 04/01/2019 12/11/2018  Hearing? Y -  Comment Hearing aid -  Vision? Y -  Comment She plans to schedule eye exam for new glasses -  Difficulty concentrating or making decisions? Y -  Comment Difficulty remembering at times; age appropriate -  Walking or climbing stairs? Y -  Comment Unsteady gait. Difficulty supporting body weight. Wheelchair in use when ambulating. -  Dressing or bathing? Y -  Comment CNA assist -  Doing errands, shopping? Y N  Comment She does not drive Facilities manager and eating ? Y -  Comment She does not cook. Self feeds. -  Using the Toilet? Y -  Comment Difficulty getting to the toilet without assistance. Wears daily brief. Does not use BSC. -  In the past six months, have you accidently leaked urine? Y -  Comment Managed with daily brief. -  Do you have problems with loss of bowel control? N -  Managing your Medications? [place] your Finances? Y -  Comment Daughter assist -  Housekeeping or managing your Housekeeping? Y -  Comment Maid assist -  Some recent data might be hidden    Patient Care Team: [name], MD as PCP - General (Family Medicine) Minna Merritts, MD as PCP - Cardiology (Cardiology)    Assessment:   This is a routine wellness examination for [place].  The goal of the wellness visit is to assist the patient how to close the gaps in care and create a preventative care plan for the patient.    The roster of all physicians providing medical care to patient is listed in the Snapshot section of the chart.  Safety issues reviewed; Smoke and carbon monoxide detectors in the home.  No firearms in the home.  Wears seatbelts when riding with others.  No violence in the home. Life alert in place. She does not drive. Patient currently lives in [place] with assisted living.  They  do not have excessive sun exposure.  Discussed the need for sun protection:  hats, long sleeves and the use of sunscreen if there is significant sun exposure.  Depression- PHQ 2 &9 complete.  There are no signs/symptoms or verbal communication regarding depression, irritability, anhedonia, sadness/tearfullness.   Patient is alert, normal appearance, oriented to person/place/and time.  ADLs- receives assistance with bathing, dressing, cooking, household chores, toileting and grooming from [facility] facility. Ambulates with wheelchair. Self feeds.  Notes she plans to start physical therapy and medication reconciliation with home [facility] facility tomorrow so she will be able to do more on her own.   BMI- discussed the importance of a healthy diet, water intake and the benefits of aerobic exercise.  Educational material provided.   24 hour diet recall: Healthy diet regulated by the facility.   Daily fluid intake: restricted to 28 ounces of fluid daily.   See history for alcohol intake.  Dental- every 6 months.  Eye- Visual acuity not assessed per patient preference since they have regular follow up with the ophthalmologist.  Wears corrective lenses.  Sleep patterns- Sleeps much better now that she has moved with averaging about 6-8 hours at night.  Wakes feeling rested. No CPAP in use.  Labs completed today per her pcp.  Covid 19 precautions and sickness symptoms discussed.   Patient Concerns:  None at this time.  Follow up with PCP as needed.  Exercise Activities and Dietary recommendations Current Exercise Habits: The patient does not participate in regular exercise at present  Goals      Patient Stated   . Increase physical activity (pt-stated)     Start physical therapy and see it through to the end. Then continue practicing when released.       Fall Risk Fall Risk  04/01/2019 07/18/2018 03/12/2018 01/14/2018 01/01/2018  Falls in the past year? 1 Yes Yes No No  Number falls in past yr: - 1 1 - -  Injury with Fall? - No No - -  Comment - - The walker  was off balance with weight on the handle and tipped over.  She was able to stand up on her own without injury or assistance.  - -  Risk Factor Category  - - - - -  Risk for fall due to : - Impaired balance/gait Impaired mobility - -  Follow up - - Falls prevention discussed;Education provided - -   Depression Screen PHQ 2/9 Scores 04/01/2019 07/18/2018 03/12/2018 01/14/2018  PHQ - 2 Score 0 0 0 0     Cognitive Function MMSE - Mini Mental State Exam 08/19/2015  Orientation to time 5  Orientation to Place 5  Registration 3  Attention/ Calculation 5  Recall 3  Language- name 2 objects 2  Language- repeat 1  Language- follow 3 step command 3  Language- read & follow direction 1  Write a sentence 1  Copy design 1  Total score 30     6CIT Screen 04/01/2019 03/12/2018 12/19/2016  What Year? 0 points 0 points 0 points  What month? 0 points 0 points 0 points  What time? 0 points 0 points 0 points  Count back from 20 0 points 0 points 0 points  Months in reverse 0 points 0 points 0 points  Repeat phrase 0 points 0 points -  Total Score 0 0 -    Immunization History  Administered Date(s) Administered  . Influenza Split 09/19/2011, 05/27/2012  . Influenza, High Dose Seasonal PF 06/17/2018  .  Influenza,inj,Quad PF,6+ Mos 07/02/2013, 06/03/2014, 06/17/2015  . Pneumococcal Conjugate-13 12/01/2013  . Pneumococcal Polysaccharide-23 12/22/2011  . Tdap 06/19/2011  . Zoster 06/19/2007   Screening Tests Health Maintenance  Topic Date Due  . URINE MICROALBUMIN  09/14/2016  . OPHTHALMOLOGY EXAM  06/05/2018  . FOOT EXAM  01/02/2019  . HEMOGLOBIN A1C  04/17/2019  . INFLUENZA VACCINE  04/19/2019  . TETANUS/TDAP  06/18/2021  . DEXA SCAN  Completed  . PNA vac Low Risk Adult  Completed      Plan:    End of life planning; Advance aging; Advanced directives discussed.  Copy of current HCPOA/Living Will on file.     I have personally reviewed and noted the following in the patient's chart:    . Medical and social history . Use of alcohol, tobacco or illicit drugs  . Current medications and supplements . Functional ability and status . Nutritional status . Physical activity . Advanced directives . List of other physicians . Hospitalizations, surgeries, and ER visits in previous 12 months . Vitals . Screenings to include cognitive, depression, and falls . Referrals and appointments  In addition, I have reviewed and discussed with patient certain preventive protocols, quality metrics, and best practice recommendations. A written personalized care plan for preventive services as well as general preventive health recommendations were provided to patient.     Varney Biles, LPN  [id number]/[id number]

## 2019-04-01 NOTE — Progress Notes (Deleted)
Virtual Visit via video Note  This visit type was conducted due to national recommendations for restrictions regarding the COVID-19 pandemic (e.g. social distancing).  This format is felt to be most appropriate for this patient at this time.  All issues noted in this document were discussed and addressed.  No physical exam was performed (except for noted visual exam findings with Video Visits).   I connected with  today at 11:00 AM EDT by a video enabled telemedicine application or telephone and verified that I am speaking with the correct person using two identifiers. Location patient:  Location provider: work or home office Persons participating in the virtual visit: patient, provider  I discussed the limitations, risks, security and privacy concerns of performing an evaluation and management service by telephone and the availability of in person appointments. I also discussed with the patient that there may be a patient responsible charge related to this service. The patient expressed understanding and agreed to proceed.  Interactive audio and video telecommunications were attempted between this provider and patient, however failed, due to patient having technical difficulties OR patient did not have access to video capability.  We continued and completed visit with audio only. ***  Reason for visit: ***  HPI: ***   ROS: See pertinent positives and negatives per HPI.  Past Medical History:  Diagnosis Date  . Atrial fibrillation ([place])   . Benign paroxysmal positional vertigo 07/17/2014  . Chronic diastolic CHF (congestive heart failure) ([place])    a. echo 10/19: EF of 60-65%, no RWMA, Gr2DD, calcified mitral annulus with mild MR, mildly to moderately dilated left atrium, moderate TR, PASP 52 mmHg  . Diabetes mellitus   . H/O: rheumatic fever   . Hyperlipidemia   . Hypertension   . Pulmonary hypertension ([place])   . Stroke [facility])    TIA Jan. 1st  . Teratoma of left ovary   .  Thyroid disease     Past Surgical History:  Procedure Laterality Date  . ABDOMINAL HYSTERECTOMY  1973   menorrhagia  . CARDIOVERSION N/A 10/22/2018   Procedure: CARDIOVERSION;  Surgeon: Nelva Bush, MD;  Location: [facility] ORS;  Service: Cardiovascular;  Laterality: N/A;  . CARDIOVERSION N/A 11/26/2018   Procedure: CARDIOVERSION (CATH LAB);  Surgeon: Minna Merritts, MD;  Location: [facility] ORS;  Service: Cardiovascular;  Laterality: N/A;  . COLONOSCOPY WITH PROPOFOL N/A 12/16/2018   Procedure: COLONOSCOPY WITH PROPOFOL;  Surgeon: Lin Landsman, MD;  Location: [facility] ENDOSCOPY;  Service: Gastroenterology;  Laterality: N/A;  . ENTEROSCOPY N/A 12/16/2018   Procedure: Push ENTEROSCOPY;  Surgeon: Lin Landsman, MD;  Location[facility] ENDOSCOPY;  Service: Gastroenterology;  Laterality: N/A;  . ESOPHAGOGASTRODUODENOSCOPY (EGD) WITH PROPOFOL N/A 12/14/2018   Procedure: ESOPHAGOGASTRODUODENOSCOPY (EGD) WITH PROPOFOL;  Surgeon: Lucilla Lame, MD;  Location: [facility] ENDOSCOPY;  Service: Endoscopy;  Laterality: N/A;  . HERNIA REPAIR  1994  . ORIF ANKLE FRACTURE Right 10/29/2015   Procedure: OPEN REDUCTION INTERNAL FIXATION (ORIF) ANKLE FRACTURE;  Surgeon: Thornton Park, MD;  Location: [facility] ORS;  Service: Orthopedics;  Laterality: Right;  . TEE WITHOUT CARDIOVERSION N/A 10/22/2018   Procedure: TRANSESOPHAGEAL ECHOCARDIOGRAM (TEE);  Surgeon: Nelva Bush, MD;  Location: [facility] ORS;  Service: Cardiovascular;  Laterality: N/A;  . VAGINAL DELIVERY     x4    Family History  Problem Relation Age of Onset  . Heart disease Mother 65  . Heart attack Mother 43  . Cancer Father        colon  . Diabetes Sister   .  Hypertension Sister   . Cancer Brother        kidney  . Diabetes Brother   . Cancer Daughter        ovarian  . Non-Hodgkin's lymphoma Son     SOCIAL HX: ***   Current Outpatient Medications:  .  acetaminophen (TYLENOL) 325 MG tablet, Take 650 mg by mouth every 6 (six) hours as needed for  mild pain or moderate pain. , Disp: , Rfl:  .  amiodarone (PACERONE) 200 MG tablet, Take 1 tablet (200 mg total) by mouth daily., Disp: 30 tablet, Rfl: 0 .  aspirin EC 81 MG tablet, Take 81 mg by mouth daily., Disp: , Rfl:  .  bisacodyl (BISACODYL LAXATIVE) 10 MG suppository, Place 10 mg rectally as needed for moderate constipation., Disp: , Rfl:  .  CVS VITAMIN B12 1000 MCG tablet, TAKE 1 TABLET BY MOUTH EVERY DAY, Disp: 90 tablet, Rfl: 1 .  ferrous sulfate 325 (65 FE) MG EC tablet, Take 1 tablet (325 mg total) by mouth daily with breakfast., Disp: 90 tablet, Rfl: 1 .  furosemide (LASIX) 20 MG tablet, TAKE ONE TABLET BY MOUTH ONCE DAILY, Disp: 30 tablet, Rfl: 2 .  glucose blood (ONE TOUCH ULTRA TEST) test strip, Use as instructed to check Blood sugar three times a day.  E11.65 Dispense patient choice., Disp: 300 each, Rfl: 3 .  Insulin Glargine (LANTUS SOLOSTAR) 100 UNIT/ML Solostar Pen, INJECT  17 UNITS SUBCUTANEOUSLY IN THE MORNING (Patient taking differently: Inject 17 Units into the skin daily. ), Disp: 15 mL, Rfl: 2 .  Insulin Pen Needle (PEN NEEDLES) 32G X 4 MM MISC, Inject 1 Dose into the skin daily., Disp: 90 each, Rfl: 3 .  levothyroxine (SYNTHROID) 100 MCG tablet, TAKE 1 TABLET BY MOUTH 30-60 MINUTES BEFORE BREAKFAST ON AN EMPTY STOMACH FOR THYROID REPLACEMENT, Disp: 30 tablet, Rfl: 3 .  levothyroxine (SYNTHROID, LEVOTHROID) 75 MCG tablet, Take 1 tablet (75 mcg total) by mouth daily., Disp: 90 tablet, Rfl: 3 .  metFORMIN (GLUCOPHAGE) 1000 MG tablet, Take 1 tablet (1,000 mg total) by mouth 2 (two) times daily with a meal. TAKE 1 TABLET TWICE DAILY  WITH  A  MEAL (Patient taking differently: Take 1,000 mg by mouth 2 (two) times daily with a meal. ), Disp: 180 tablet, Rfl: 3 .  omeprazole (PRILOSEC) 20 MG capsule, Take 1 capsule (20 mg total) by mouth daily., Disp: 90 capsule, Rfl: 3 .  ondansetron (ZOFRAN) 4 MG tablet, Take 4 mg by mouth every 8 (eight) hours as needed for nausea., Disp: ,  Rfl:  .  polyethylene glycol (MIRALAX / GLYCOLAX) packet, Take 17 g by mouth daily as needed for moderate constipation. , Disp: , Rfl:  .  potassium chloride (K-DUR) 10 MEQ tablet, TAKE 1 TABLET BY MOUTH EVERY DAY FOR SUPPLEMENT, Disp: 30 tablet, Rfl: 1 .  simvastatin (ZOCOR) 20 MG tablet, Take 1 tablet (20 mg total) by mouth every evening., Disp: 30 tablet, Rfl: 1  EXAM:  VITALS per patient if applicable:  GENERAL: alert, oriented, appears well and in no acute distress  HEENT: atraumatic, conjunttiva clear, no obvious abnormalities on inspection of external nose and ears  NECK: normal movements of the head and neck  LUNGS: on inspection no signs of respiratory distress, breathing rate appears normal, no obvious gross SOB, gasping or wheezing  CV: no obvious cyanosis  MS: moves all visible extremities without noticeable abnormality  PSYCH/NEURO: pleasant and cooperative, no obvious depression or anxiety, speech and  thought processing grossly intact  ASSESSMENT AND PLAN:  Discussed the following assessment and plan:  No problem-specific Assessment & Plan notes found for this encounter.    I discussed the assessment and treatment plan with the patient. The patient was provided an opportunity to ask questions and all were answered. The patient agreed with the plan and demonstrated an understanding of the instructions.   The patient was advised to call back or seek an in-person evaluation if the symptoms worsen or if the condition fails to improve as anticipated.  I provided *** minutes of non-face-to-face time during this encounter.   Tommi Rumps, MD

## 2019-04-01 NOTE — Patient Instructions (Signed)
Nice to see you. Please complete the physical therapy through your living facility. We will check lab work today and contact you with the results.

## 2019-04-01 NOTE — Telephone Encounter (Signed)
Signed earlier today.

## 2019-04-02 NOTE — Telephone Encounter (Signed)
Faxed to [facility] today. cinfirmed fax went through.    Nina,cma

## 2019-04-04 ENCOUNTER — Ambulatory Visit: Payer: PPO

## 2019-04-04 ENCOUNTER — Other Ambulatory Visit: Payer: PPO

## 2019-04-06 DIAGNOSIS — R2689 Other abnormalities of gait and mobility: Secondary | ICD-10-CM | POA: Insufficient documentation

## 2019-04-06 DIAGNOSIS — E538 Deficiency of other specified B group vitamins: Secondary | ICD-10-CM | POA: Insufficient documentation

## 2019-04-06 NOTE — Progress Notes (Signed)
I have reviewed the above note and agree.  Grabiela Wohlford, M.D.  

## 2019-04-06 NOTE — Assessment & Plan Note (Signed)
Seems to be adequately controlled.  Continue current medication.  Check A1c.

## 2019-04-06 NOTE — Assessment & Plan Note (Signed)
No symptoms of GI bleed.  We will recheck labs.

## 2019-04-06 NOTE — Assessment & Plan Note (Signed)
Check B12 

## 2019-04-06 NOTE — Assessment & Plan Note (Signed)
Check TSH 

## 2019-04-06 NOTE — Progress Notes (Signed)
Tommi Rumps, MD Phone: [phone]  Diane Mullins is a 85 y.o. female who presents today for follow-up.  Diabetes: Typically running 85-92.  Taking metformin and Lantus 15 units daily.  No polyuria or polydipsia.  No hypoglycemia.  GI bleed: Patient was hospitalized for this.  She underwent EGD, colonoscopy, and capsule endoscopy with no evidence of bleeding.  She is had no melena.  No blood in her stool.  No abdominal pain.  She is taking iron.  She was also felt to be B12 deficient and has been taking oral iron.  A. fib: No longer on Eliquis related to bleeding issues with GI bleed.  Only taking aspirin currently.  No palpitations.  She is on amiodarone.  No shortness of breath.  She is limiting her liquid intake per advice from cardiology.  It appears cardiology may have felt that she could restart the Eliquis several weeks following her GI bleed.  Trouble walking: She notes this started when she was in the hospital.  Feels like her feet are stuck to the floor.  She can move her legs perfectly fine when she is in the chair.  She has seen neurology for this and they checked lab work and started her on B12.  She was not able to follow-up with them yet.  She does note one fall back in April where she fell onto her face and had 2 black eyes.  She notes no loss of consciousness.  No falls since then.  No dizziness.  Social History   Tobacco Use  Smoking Status Never Smoker  Smokeless Tobacco Never Used     ROS see history of present illness  Objective  Physical Exam Vitals:   04/01/19 1109  BP: 130/70  Pulse: 67  Temp: 98.6 F (37 C)  SpO2: 97%    BP Readings from Last 3 Encounters:  04/01/19 130/70  04/01/19 130/70  12/18/18 (!) 143/55   Wt Readings from Last 3 Encounters:  04/01/19 191 lb (86.6 kg)  04/01/19 191 lb 1.9 oz (86.7 kg)  12/17/18 195 lb 15.8 oz (88.9 kg)    Physical Exam Constitutional:      General: She is not in acute distress.    Appearance: She  is not diaphoretic.  Cardiovascular:     Rate and Rhythm: Normal rate and regular rhythm.     Heart sounds: Normal heart sounds.  Pulmonary:     Effort: Pulmonary effort is normal.     Breath sounds: Normal breath sounds.  Abdominal:     General: Bowel sounds are normal. There is no distension.     Palpations: Abdomen is soft.     Tenderness: There is no abdominal tenderness.  Skin:    General: Skin is warm and dry.  Neurological:     Mental Status: She is alert.     Comments: CN 3-12 intact, 5/5 strength in bilateral biceps, triceps, grip, quads, hamstrings, plantar and dorsiflexion, sensation to light touch intact in bilateral UE and LE, is able to stand with mild assistance though when she attempts to take a step she becomes off balance      Assessment/Plan: Please see individual problem list.  Atrial flutter ([place]) Asymptomatic.  I will send my note to her cardiologist to determine if she should restart Eliquis.  Gastrointestinal hemorrhage with melena No symptoms of GI bleed.  We will recheck labs.  Diabetes ([place]) Seems to be adequately controlled.  Continue current medication.  Check A1c.  Hypothyroidism Check TSH.  B12 deficiency Check B12.  Balance problem She will continue to see neurology.  She will complete physical therapy.   Orders Placed This Encounter  Procedures  . CBC  . IBC + Ferritin  . B12  . HgB A1c  . TSH  . Comp Met (CMET)    No orders of the defined types were placed in this encounter.    Tommi Rumps, MD [place]

## 2019-04-06 NOTE — Assessment & Plan Note (Signed)
Asymptomatic.  I will send my note to her cardiologist to determine if she should restart Eliquis.

## 2019-04-06 NOTE — Assessment & Plan Note (Signed)
She will continue to see neurology.  She will complete physical therapy.

## 2019-04-07 ENCOUNTER — Other Ambulatory Visit: Payer: Self-pay | Admitting: Family Medicine

## 2019-04-07 DIAGNOSIS — E785 Hyperlipidemia, unspecified: Secondary | ICD-10-CM | POA: Diagnosis not present

## 2019-04-07 DIAGNOSIS — Z8673 Personal history of transient ischemic attack (TIA), and cerebral infarction without residual deficits: Secondary | ICD-10-CM | POA: Diagnosis not present

## 2019-04-07 DIAGNOSIS — I5032 Chronic diastolic (congestive) heart failure: Secondary | ICD-10-CM | POA: Diagnosis not present

## 2019-04-07 DIAGNOSIS — Z760 Encounter for issue of repeat prescription: Secondary | ICD-10-CM

## 2019-04-07 DIAGNOSIS — N183 Chronic kidney disease, stage 3 (moderate): Secondary | ICD-10-CM | POA: Diagnosis not present

## 2019-04-07 DIAGNOSIS — J449 Chronic obstructive pulmonary disease, unspecified: Secondary | ICD-10-CM | POA: Diagnosis not present

## 2019-04-07 DIAGNOSIS — H8111 Benign paroxysmal vertigo, right ear: Secondary | ICD-10-CM | POA: Diagnosis not present

## 2019-04-07 DIAGNOSIS — I4892 Unspecified atrial flutter: Secondary | ICD-10-CM | POA: Diagnosis not present

## 2019-04-07 DIAGNOSIS — E538 Deficiency of other specified B group vitamins: Secondary | ICD-10-CM | POA: Diagnosis not present

## 2019-04-07 DIAGNOSIS — I130 Hypertensive heart and chronic kidney disease with heart failure and stage 1 through stage 4 chronic kidney disease, or unspecified chronic kidney disease: Secondary | ICD-10-CM | POA: Diagnosis not present

## 2019-04-07 DIAGNOSIS — Z9181 History of falling: Secondary | ICD-10-CM | POA: Diagnosis not present

## 2019-04-07 DIAGNOSIS — I4891 Unspecified atrial fibrillation: Secondary | ICD-10-CM | POA: Diagnosis not present

## 2019-04-07 DIAGNOSIS — D631 Anemia in chronic kidney disease: Secondary | ICD-10-CM | POA: Diagnosis not present

## 2019-04-07 DIAGNOSIS — E1122 Type 2 diabetes mellitus with diabetic chronic kidney disease: Secondary | ICD-10-CM | POA: Diagnosis not present

## 2019-04-07 DIAGNOSIS — H814 Vertigo of central origin: Secondary | ICD-10-CM | POA: Diagnosis not present

## 2019-04-07 DIAGNOSIS — I272 Pulmonary hypertension, unspecified: Secondary | ICD-10-CM | POA: Diagnosis not present

## 2019-04-07 DIAGNOSIS — Z794 Long term (current) use of insulin: Secondary | ICD-10-CM | POA: Diagnosis not present

## 2019-04-07 DIAGNOSIS — E039 Hypothyroidism, unspecified: Secondary | ICD-10-CM | POA: Diagnosis not present

## 2019-04-09 ENCOUNTER — Telehealth: Payer: Self-pay

## 2019-04-09 NOTE — Telephone Encounter (Signed)
Copied from [place] [id number]. Topic: Appointment Scheduling - Scheduling Inquiry for Clinic >> Apr 08, 2019  5:02 PM Rainey Pines A wrote: Patient needs to schedule a b12 shot

## 2019-04-10 ENCOUNTER — Telehealth: Payer: Self-pay | Admitting: Family Medicine

## 2019-04-10 NOTE — Telephone Encounter (Signed)
Called and spoke to [place] @ [facility].  Gave verbal orders for OT as requested 2 week 4.

## 2019-04-10 NOTE — Telephone Encounter (Signed)
Copied from [place] [phone]. Topic: Quick Communication - Home Health Verbal Orders >> Apr 10, 2019  9:26 AM Nils Flack wrote: Caller/Agency: [facility]  Callback Number: [phone] Requesting OT/PT/Skilled Nursing/Social Work/Speech Therapy: OT Frequency: 2 week 4

## 2019-04-11 ENCOUNTER — Other Ambulatory Visit: Payer: Self-pay | Admitting: Family Medicine

## 2019-04-16 ENCOUNTER — Ambulatory Visit: Payer: PPO

## 2019-04-17 ENCOUNTER — Other Ambulatory Visit: Payer: Self-pay | Admitting: Family Medicine

## 2019-04-17 ENCOUNTER — Ambulatory Visit: Payer: PPO

## 2019-04-17 NOTE — Telephone Encounter (Signed)
Medication Refill - Medication: furosemide (LASIX) 20 MG tablet/levothyroxine (SYNTHROID) 100 MCG tablet/potassium chloride (K-DUR) 10 MEQ tablet/[facility] [facility] requesting refills for pt to be faxed to their facility. FAX#[phone]  Has the patient contacted their pharmacy? No. (Agent: If no, request that the patient contact the pharmacy for the refill.) (Agent: If yes, when and what did the pharmacy advise?)  Preferred Pharmacy (with phone number or street name): FAX to [facility] FAX#[phone]  Agent: Please be advised that RX refills may take up to 3 business days. We ask that you follow-up with your pharmacy.

## 2019-04-21 NOTE — Telephone Encounter (Signed)
The patient is requesting a refill on Lasix, Synthroid and Potassium and she would like the prescriptions faxed to the facility ([facility])  The fax # is [phone].  Leanor Voris,cma

## 2019-04-21 NOTE — Telephone Encounter (Signed)
Please contact the patient and confirm her current dose of Synthroid.  It appears she has 2 doses listed.  I believe she should be on the Synthroid 100 mcg daily though I would like to confirm this prior to sending in a refill.  Thanks.

## 2019-04-22 ENCOUNTER — Other Ambulatory Visit: Payer: Self-pay | Admitting: Family Medicine

## 2019-04-22 ENCOUNTER — Telehealth: Payer: Self-pay | Admitting: *Deleted

## 2019-04-22 NOTE — Telephone Encounter (Signed)
No answer. Left detailed message that Dr Rockey Situ would like to restart her Eliquis and to contact PCP to arrange for follow up lab work. Asked her to please call us back to confirm she got this message and confirm her pharmacy.

## 2019-04-22 NOTE — Telephone Encounter (Signed)
-----   Message from Minna Merritts, MD sent at 04/22/2019 10:40 AM EDT ----- Regarding: FW: eliquis Can we restart eliquis 5 BID Thx TG ----- Message ----- From: [name], MD Sent: 04/17/2019   3:52 PM EDT To: Minna Merritts, MD Subject: RE: eliquis                                    I am not sure if this sent earlier, though wanted to make sure you got my response. I think it would be ok to trial her back on the eliquis. Could you send this in for her? I could arrange for a CBC through my office in 2 weeks if she starts back on the eliquis.   Randall Hiss ----- Message ----- From: Minna Merritts, MD Sent: 04/08/2019   7:59 AM EDT To: [name], MD Subject: eliquis                                        Difficult situation but might have reached that threshold where we could retry the Eliquis 5 twice daily with close monitoring of hemoglobin over the next several weeks Let me know if you agree and want me to call that in Thx TG ----- Message ----- From: [name], MD Sent: 04/06/2019   1:16 PM EDT To: Minna Merritts, MD  Hi Dr Rockey Situ,   I saw Mrs Bertram this past week for follow-up. She has remained off her eliquis since her GI bleed earlier this year. She does not currently have any symptoms of an active GI bleed and her hemoglobin has recovered slightly to 10.2. Her ferritin remains mildly low. It appears you mentioned that she should be started back on eliquis after a period of time following her GI bleed in your consult note from her hospitalization. I wanted to follow-up and see if this is still something you would recommend. Thanks for your help.  Tommi Rumps

## 2019-04-23 ENCOUNTER — Telehealth: Payer: Self-pay | Admitting: Family Medicine

## 2019-04-23 NOTE — Telephone Encounter (Signed)
Could not reach patient voicemail box has not been set up, will try later

## 2019-04-23 NOTE — Telephone Encounter (Signed)
Diane Mullins with [facility] called in to request pt's refills on medications below.   furosemide (LASIX) 20 MG tablet potassium chloride (K-DUR) 10 MEQ tablet  metFORMIN (GLUCOPHAGE) 1000 MG tablet  amiodarone (PACERONE) 200 MG tablet    Fax: [phone]   Phone: [phone]    They will make a copy of the Rx to have on file and then send them in to pt's pharmacy to fill.

## 2019-04-24 ENCOUNTER — Ambulatory Visit: Payer: PPO

## 2019-04-24 NOTE — Telephone Encounter (Signed)
No voicemail. [place] for pec to ask about dosage of synthroid.  Karey Suthers,cma

## 2019-04-25 ENCOUNTER — Other Ambulatory Visit: Payer: Self-pay | Admitting: Family Medicine

## 2019-04-25 ENCOUNTER — Telehealth: Payer: Self-pay

## 2019-04-25 MED ORDER — METFORMIN HCL 1000 MG PO TABS: 1000.0000 mg | ORAL_TABLET | Freq: Two times a day (BID) | ORAL | 3 refills | Status: DC

## 2019-04-25 NOTE — Telephone Encounter (Signed)
The amiodarone needs to be refilled by cardiology. Please call and advise the patient of this. Thanks.

## 2019-04-25 NOTE — Telephone Encounter (Signed)
Patient showed for nurse visit.  Pt was brought to office by assisted living [facility] transportation service.  Lucianne Lei driver called into office while pt was in parking lot stating that pt was nauseous from [place] ride.  Requested to receive the B12 injection in [place].  I checked with Lorriane Shire, front office supervisor and Juliann Pulse, clinic team lead.  Both agreed that I could not give pt B12 injection in van.  Advised pt to reschedule B12 injection.  Pt left paperwork for Dr. Caryl Bis regarding a medication authorization denial.  Copies put in Dr. Ellen Henri review folder.

## 2019-04-25 NOTE — Telephone Encounter (Signed)
Refill sent for metformin.  Amiodarone (Pacerone) was prescribed by a historical provider.  It looks like a refill for Lasix and potassium chloride was sent in to pharmacy on 03/24/19 with refills.  Pt should have enough.  Pt is using [facility].

## 2019-04-28 NOTE — Telephone Encounter (Signed)
Unable to contact pt.  No voicemail set up on cell.  LMTCB on pt's home number.  Called and spoke to [facility] w/ [employer] who made refill request.  Informed Aliyah that all med refill requests have been sent in except for amidarone (Pacerone).  Informed Aliyah that amidarone will need to be refilled by pt's cardiologist.  Butch Penny said that pt now has refill on that med.

## 2019-04-29 DIAGNOSIS — E785 Hyperlipidemia, unspecified: Secondary | ICD-10-CM | POA: Diagnosis not present

## 2019-04-29 DIAGNOSIS — Z8673 Personal history of transient ischemic attack (TIA), and cerebral infarction without residual deficits: Secondary | ICD-10-CM | POA: Diagnosis not present

## 2019-04-29 DIAGNOSIS — N183 Chronic kidney disease, stage 3 (moderate): Secondary | ICD-10-CM | POA: Diagnosis not present

## 2019-04-29 DIAGNOSIS — H9190 Unspecified hearing loss, unspecified ear: Secondary | ICD-10-CM | POA: Diagnosis not present

## 2019-04-29 DIAGNOSIS — E538 Deficiency of other specified B group vitamins: Secondary | ICD-10-CM | POA: Diagnosis not present

## 2019-04-29 DIAGNOSIS — I5032 Chronic diastolic (congestive) heart failure: Secondary | ICD-10-CM | POA: Diagnosis not present

## 2019-04-29 DIAGNOSIS — H6123 Impacted cerumen, bilateral: Secondary | ICD-10-CM | POA: Diagnosis not present

## 2019-04-29 DIAGNOSIS — I4892 Unspecified atrial flutter: Secondary | ICD-10-CM | POA: Diagnosis not present

## 2019-04-29 DIAGNOSIS — D631 Anemia in chronic kidney disease: Secondary | ICD-10-CM | POA: Diagnosis not present

## 2019-04-29 DIAGNOSIS — H814 Vertigo of central origin: Secondary | ICD-10-CM | POA: Diagnosis not present

## 2019-04-29 DIAGNOSIS — J449 Chronic obstructive pulmonary disease, unspecified: Secondary | ICD-10-CM | POA: Diagnosis not present

## 2019-04-29 DIAGNOSIS — Z9181 History of falling: Secondary | ICD-10-CM | POA: Diagnosis not present

## 2019-04-29 DIAGNOSIS — E1122 Type 2 diabetes mellitus with diabetic chronic kidney disease: Secondary | ICD-10-CM | POA: Diagnosis not present

## 2019-04-29 DIAGNOSIS — Z794 Long term (current) use of insulin: Secondary | ICD-10-CM | POA: Diagnosis not present

## 2019-04-29 DIAGNOSIS — I4891 Unspecified atrial fibrillation: Secondary | ICD-10-CM | POA: Diagnosis not present

## 2019-04-29 DIAGNOSIS — I130 Hypertensive heart and chronic kidney disease with heart failure and stage 1 through stage 4 chronic kidney disease, or unspecified chronic kidney disease: Secondary | ICD-10-CM | POA: Diagnosis not present

## 2019-04-29 DIAGNOSIS — I272 Pulmonary hypertension, unspecified: Secondary | ICD-10-CM | POA: Diagnosis not present

## 2019-04-29 DIAGNOSIS — E039 Hypothyroidism, unspecified: Secondary | ICD-10-CM | POA: Diagnosis not present

## 2019-04-29 DIAGNOSIS — H8111 Benign paroxysmal vertigo, right ear: Secondary | ICD-10-CM | POA: Diagnosis not present

## 2019-05-01 DIAGNOSIS — H8111 Benign paroxysmal vertigo, right ear: Secondary | ICD-10-CM

## 2019-05-01 DIAGNOSIS — Z794 Long term (current) use of insulin: Secondary | ICD-10-CM

## 2019-05-01 DIAGNOSIS — I4891 Unspecified atrial fibrillation: Secondary | ICD-10-CM

## 2019-05-01 DIAGNOSIS — Z9181 History of falling: Secondary | ICD-10-CM

## 2019-05-01 DIAGNOSIS — I5032 Chronic diastolic (congestive) heart failure: Secondary | ICD-10-CM

## 2019-05-01 DIAGNOSIS — E039 Hypothyroidism, unspecified: Secondary | ICD-10-CM

## 2019-05-01 DIAGNOSIS — H814 Vertigo of central origin: Secondary | ICD-10-CM | POA: Diagnosis not present

## 2019-05-01 DIAGNOSIS — Z8673 Personal history of transient ischemic attack (TIA), and cerebral infarction without residual deficits: Secondary | ICD-10-CM

## 2019-05-01 DIAGNOSIS — J449 Chronic obstructive pulmonary disease, unspecified: Secondary | ICD-10-CM

## 2019-05-01 DIAGNOSIS — E785 Hyperlipidemia, unspecified: Secondary | ICD-10-CM

## 2019-05-01 DIAGNOSIS — I272 Pulmonary hypertension, unspecified: Secondary | ICD-10-CM

## 2019-05-01 DIAGNOSIS — E1122 Type 2 diabetes mellitus with diabetic chronic kidney disease: Secondary | ICD-10-CM

## 2019-05-01 DIAGNOSIS — I4892 Unspecified atrial flutter: Secondary | ICD-10-CM

## 2019-05-01 DIAGNOSIS — N183 Chronic kidney disease, stage 3 (moderate): Secondary | ICD-10-CM

## 2019-05-01 DIAGNOSIS — D631 Anemia in chronic kidney disease: Secondary | ICD-10-CM | POA: Diagnosis not present

## 2019-05-01 DIAGNOSIS — I130 Hypertensive heart and chronic kidney disease with heart failure and stage 1 through stage 4 chronic kidney disease, or unspecified chronic kidney disease: Secondary | ICD-10-CM

## 2019-05-01 DIAGNOSIS — E538 Deficiency of other specified B group vitamins: Secondary | ICD-10-CM

## 2019-05-02 NOTE — Telephone Encounter (Signed)
Fax#[phone]

## 2019-05-02 NOTE — Telephone Encounter (Signed)
I attempted to contact the patient on her cell "preferred" #- no answer/ no voice mail set up.  I attempted to contact her at her home # No answer- I left a message to please call back.

## 2019-05-02 NOTE — Telephone Encounter (Signed)
Marissa calling from [facility] called and stated that the patient would like them to start giving b12 injection. Marissa states that she would need an order sent over. She faxed order over. Please advise

## 2019-05-06 MED ORDER — APIXABAN 5 MG PO TABS: 5.0000 mg | ORAL_TABLET | Freq: Two times a day (BID) | ORAL | 2 refills | Status: DC

## 2019-05-06 NOTE — Telephone Encounter (Signed)
Prescription printed and signed by Dr Rockey Situ. Faxed to [facility].

## 2019-05-06 NOTE — Telephone Encounter (Signed)
I attempted to contact the patient on her cell "preferred" #- no answer/ no voice mail set up.  I attempted to contact her at her home # No answer- I left a message to please call back.

## 2019-05-06 NOTE — Telephone Encounter (Signed)
Realized patient is at [facility]. Called and spoke with Aliyah and let her know Dr Rockey Situ said it was ok to resume Eliquis 5 mg two times a day. She said we could fax prescription to them and they would take care of it for the patient. Fax to [phone].

## 2019-05-06 NOTE — Telephone Encounter (Signed)
Called again on both numbers and cannot get a response.  No voicemail on either number.  Annaleah Arata,cma

## 2019-05-07 MED ORDER — LEVOTHYROXINE SODIUM 100 MCG PO TABS: ORAL_TABLET | ORAL | 3 refills | Status: DC

## 2019-05-07 NOTE — Telephone Encounter (Signed)
Sorry it took forever I could not reach this patient until this morning and she is ok with the B12 injections done at [facility] I put the order to be singed in the basket and also the patient states her synthroid is 100 mg.  Travante Knee,cma

## 2019-05-07 NOTE — Telephone Encounter (Signed)
Provider signed order for B12 and it was faxed to [facility].  Nina,cma

## 2019-05-07 NOTE — Telephone Encounter (Signed)
Synthroid sent to pharmacy.

## 2019-05-08 ENCOUNTER — Other Ambulatory Visit: Payer: Self-pay | Admitting: Family Medicine

## 2019-05-08 DIAGNOSIS — Z20828 Contact with and (suspected) exposure to other viral communicable diseases: Secondary | ICD-10-CM | POA: Diagnosis not present

## 2019-05-09 ENCOUNTER — Telehealth: Payer: Self-pay | Admitting: Family Medicine

## 2019-05-09 DIAGNOSIS — D649 Anemia, unspecified: Secondary | ICD-10-CM

## 2019-05-09 NOTE — Addendum Note (Signed)
Addended by: [name] on: 05/09/2019 04:27 PM   Modules accepted: Orders

## 2019-05-09 NOTE — Telephone Encounter (Signed)
It looks like the patient recently started back on eliquis. I would like for her to have a cbc checked given her prior GI bleed and anemia to make sure her blood counts have remained stable on the Eliquis. Can we see if this can be done through her facility? If it can't be done there please get her set up for lab work in the office. Orders have been placed. Thanks.

## 2019-05-09 NOTE — Telephone Encounter (Signed)
CBC ordered.  Please call them next week to make sure that this gets drawn.  Thanks.

## 2019-05-09 NOTE — Telephone Encounter (Signed)
I called the facility about drawing the CBC on this patient and they informed me that [facility] does there labs so if you put it in for [facility] they will come out and draw it because they don't have a lab in the facility.  Diane Mullins,cma

## 2019-05-14 NOTE — Telephone Encounter (Signed)
I called and they stated the CBC would be drawn from [facility].  Nina,cma

## 2019-05-14 NOTE — Telephone Encounter (Signed)
Please follow-up with the facility to see if they are going to be able to draw this lab. Thanks.

## 2019-05-19 ENCOUNTER — Other Ambulatory Visit: Payer: Self-pay | Admitting: Family Medicine

## 2019-05-20 DIAGNOSIS — I5032 Chronic diastolic (congestive) heart failure: Secondary | ICD-10-CM | POA: Diagnosis not present

## 2019-06-05 DIAGNOSIS — Z20828 Contact with and (suspected) exposure to other viral communicable diseases: Secondary | ICD-10-CM | POA: Diagnosis not present

## 2019-06-10 ENCOUNTER — Telehealth: Payer: Self-pay | Admitting: *Deleted

## 2019-06-10 NOTE — Telephone Encounter (Signed)
Copied from [place] [phone]. Topic: General - Other >> Jun 10, 2019  3:52 PM Keene Breath wrote: Reason for CRM: Called to ask if the office received a fax for orders for PT for patient.  Fax was sent yesterday.  Please advise and call back to let them know if it was received.  CB# [phone]

## 2019-06-12 ENCOUNTER — Other Ambulatory Visit: Payer: Self-pay | Admitting: Family Medicine

## 2019-06-16 ENCOUNTER — Inpatient Hospital Stay
Admission: EM | Admit: 2019-06-16 | Discharge: 2019-06-19 | DRG: 811 | Disposition: A | Discharge status: Home Health | Payer: PPO | Attending: Internal Medicine | Admitting: Internal Medicine

## 2019-06-16 ENCOUNTER — Emergency Department: Payer: PPO

## 2019-06-16 ENCOUNTER — Other Ambulatory Visit: Payer: Self-pay | Admitting: Family Medicine

## 2019-06-16 ENCOUNTER — Other Ambulatory Visit: Payer: Self-pay

## 2019-06-16 ENCOUNTER — Encounter: Payer: Self-pay | Admitting: Emergency Medicine

## 2019-06-16 DIAGNOSIS — Z23 Encounter for immunization: Secondary | ICD-10-CM

## 2019-06-16 DIAGNOSIS — R471 Dysarthria and anarthria: Secondary | ICD-10-CM | POA: Diagnosis present

## 2019-06-16 DIAGNOSIS — I110 Hypertensive heart disease with heart failure: Secondary | ICD-10-CM | POA: Diagnosis present

## 2019-06-16 DIAGNOSIS — D519 Vitamin B12 deficiency anemia, unspecified: Secondary | ICD-10-CM | POA: Diagnosis not present

## 2019-06-16 DIAGNOSIS — I5033 Acute on chronic diastolic (congestive) heart failure: Secondary | ICD-10-CM | POA: Diagnosis present

## 2019-06-16 DIAGNOSIS — Z760 Encounter for issue of repeat prescription: Secondary | ICD-10-CM

## 2019-06-16 DIAGNOSIS — Z66 Do not resuscitate: Secondary | ICD-10-CM | POA: Diagnosis present

## 2019-06-16 DIAGNOSIS — E039 Hypothyroidism, unspecified: Secondary | ICD-10-CM | POA: Diagnosis not present

## 2019-06-16 DIAGNOSIS — I340 Nonrheumatic mitral (valve) insufficiency: Secondary | ICD-10-CM | POA: Diagnosis not present

## 2019-06-16 DIAGNOSIS — Z833 Family history of diabetes mellitus: Secondary | ICD-10-CM | POA: Diagnosis not present

## 2019-06-16 DIAGNOSIS — Z8249 Family history of ischemic heart disease and other diseases of the circulatory system: Secondary | ICD-10-CM

## 2019-06-16 DIAGNOSIS — Z79890 Hormone replacement therapy (postmenopausal): Secondary | ICD-10-CM | POA: Diagnosis not present

## 2019-06-16 DIAGNOSIS — K449 Diaphragmatic hernia without obstruction or gangrene: Secondary | ICD-10-CM | POA: Diagnosis present

## 2019-06-16 DIAGNOSIS — Z7901 Long term (current) use of anticoagulants: Secondary | ICD-10-CM

## 2019-06-16 DIAGNOSIS — I5032 Chronic diastolic (congestive) heart failure: Secondary | ICD-10-CM | POA: Diagnosis not present

## 2019-06-16 DIAGNOSIS — I361 Nonrheumatic tricuspid (valve) insufficiency: Secondary | ICD-10-CM | POA: Diagnosis not present

## 2019-06-16 DIAGNOSIS — D649 Anemia, unspecified: Secondary | ICD-10-CM | POA: Diagnosis not present

## 2019-06-16 DIAGNOSIS — Z20828 Contact with and (suspected) exposure to other viral communicable diseases: Secondary | ICD-10-CM | POA: Diagnosis not present

## 2019-06-16 DIAGNOSIS — D500 Iron deficiency anemia secondary to blood loss (chronic): Secondary | ICD-10-CM

## 2019-06-16 DIAGNOSIS — D62 Acute posthemorrhagic anemia: Principal | ICD-10-CM | POA: Diagnosis present

## 2019-06-16 DIAGNOSIS — K633 Ulcer of intestine: Secondary | ICD-10-CM | POA: Diagnosis not present

## 2019-06-16 DIAGNOSIS — Z8673 Personal history of transient ischemic attack (TIA), and cerebral infarction without residual deficits: Secondary | ICD-10-CM | POA: Diagnosis not present

## 2019-06-16 DIAGNOSIS — K635 Polyp of colon: Secondary | ICD-10-CM | POA: Diagnosis present

## 2019-06-16 DIAGNOSIS — Z807 Family history of other malignant neoplasms of lymphoid, hematopoietic and related tissues: Secondary | ICD-10-CM | POA: Diagnosis not present

## 2019-06-16 DIAGNOSIS — I272 Pulmonary hypertension, unspecified: Secondary | ICD-10-CM | POA: Diagnosis not present

## 2019-06-16 DIAGNOSIS — K921 Melena: Secondary | ICD-10-CM

## 2019-06-16 DIAGNOSIS — Z03818 Encounter for observation for suspected exposure to other biological agents ruled out: Secondary | ICD-10-CM | POA: Diagnosis not present

## 2019-06-16 DIAGNOSIS — Z794 Long term (current) use of insulin: Secondary | ICD-10-CM | POA: Diagnosis not present

## 2019-06-16 DIAGNOSIS — Z885 Allergy status to narcotic agent status: Secondary | ICD-10-CM | POA: Diagnosis not present

## 2019-06-16 DIAGNOSIS — E785 Hyperlipidemia, unspecified: Secondary | ICD-10-CM | POA: Diagnosis not present

## 2019-06-16 DIAGNOSIS — I482 Chronic atrial fibrillation, unspecified: Secondary | ICD-10-CM | POA: Diagnosis present

## 2019-06-16 DIAGNOSIS — K922 Gastrointestinal hemorrhage, unspecified: Secondary | ICD-10-CM | POA: Diagnosis present

## 2019-06-16 DIAGNOSIS — I959 Hypotension, unspecified: Secondary | ICD-10-CM | POA: Diagnosis not present

## 2019-06-16 DIAGNOSIS — E538 Deficiency of other specified B group vitamins: Secondary | ICD-10-CM | POA: Diagnosis not present

## 2019-06-16 DIAGNOSIS — R531 Weakness: Secondary | ICD-10-CM | POA: Diagnosis not present

## 2019-06-16 DIAGNOSIS — R0902 Hypoxemia: Secondary | ICD-10-CM | POA: Diagnosis not present

## 2019-06-16 DIAGNOSIS — R4182 Altered mental status, unspecified: Secondary | ICD-10-CM | POA: Diagnosis not present

## 2019-06-16 DIAGNOSIS — E119 Type 2 diabetes mellitus without complications: Secondary | ICD-10-CM | POA: Diagnosis present

## 2019-06-16 DIAGNOSIS — R404 Transient alteration of awareness: Secondary | ICD-10-CM | POA: Diagnosis not present

## 2019-06-16 DIAGNOSIS — D509 Iron deficiency anemia, unspecified: Secondary | ICD-10-CM | POA: Diagnosis present

## 2019-06-16 DIAGNOSIS — H9190 Unspecified hearing loss, unspecified ear: Secondary | ICD-10-CM | POA: Diagnosis present

## 2019-06-16 DIAGNOSIS — R0602 Shortness of breath: Secondary | ICD-10-CM | POA: Diagnosis not present

## 2019-06-16 LAB — URINALYSIS, ROUTINE W REFLEX MICROSCOPIC
Bilirubin Urine: NEGATIVE
Glucose, UA: NEGATIVE mg/dL
Hgb urine dipstick: NEGATIVE
Ketones, ur: 5 mg/dL — AB
Leukocytes,Ua: NEGATIVE
Nitrite: NEGATIVE
Protein, ur: NEGATIVE mg/dL
Specific Gravity, Urine: 1.01 (ref 1.005–1.030)
pH: 5 (ref 5.0–8.0)

## 2019-06-16 LAB — CBC WITH DIFFERENTIAL/PLATELET
Abs Immature Granulocytes: 0.02 10*3/uL (ref 0.00–0.07)
Basophils Absolute: 0.1 10*3/uL (ref 0.0–0.1)
Basophils Relative: 1 %
Eosinophils Absolute: 0 10*3/uL (ref 0.0–0.5)
Eosinophils Relative: 0 %
HCT: 17.2 % — ABNORMAL LOW (ref 36.0–46.0)
Hemoglobin: 5.1 g/dL — ABNORMAL LOW (ref 12.0–15.0)
Immature Granulocytes: 0 %
Lymphocytes Relative: 26 %
Lymphs Abs: 1.8 10*3/uL (ref 0.7–4.0)
MCH: 27.1 pg (ref 26.0–34.0)
MCHC: 29.7 g/dL — ABNORMAL LOW (ref 30.0–36.0)
MCV: 91.5 fL (ref 80.0–100.0)
Monocytes Absolute: 0.7 10*3/uL (ref 0.1–1.0)
Monocytes Relative: 11 %
Neutro Abs: 4.3 10*3/uL (ref 1.7–7.7)
Neutrophils Relative %: 62 %
Platelets: 360 10*3/uL (ref 150–400)
RBC: 1.88 MIL/uL — ABNORMAL LOW (ref 3.87–5.11)
RDW: 19.4 % — ABNORMAL HIGH (ref 11.5–15.5)
WBC: 7 10*3/uL (ref 4.0–10.5)
nRBC: 0 % (ref 0.0–0.2)

## 2019-06-16 LAB — GLUCOSE, CAPILLARY
Glucose-Capillary: 125 mg/dL — ABNORMAL HIGH (ref 70–99)
Glucose-Capillary: 113 mg/dL — ABNORMAL HIGH (ref 70–99)

## 2019-06-16 LAB — COMPREHENSIVE METABOLIC PANEL WITH GFR
ALT: 12 U/L (ref 0–44)
AST: 15 U/L (ref 15–41)
Albumin: 3.2 g/dL — ABNORMAL LOW (ref 3.5–5.0)
Alkaline Phosphatase: 64 U/L (ref 38–126)
Anion gap: 9 (ref 5–15)
BUN: 19 mg/dL (ref 8–23)
CO2: 27 mmol/L (ref 22–32)
Calcium: 8.5 mg/dL — ABNORMAL LOW (ref 8.9–10.3)
Chloride: 103 mmol/L (ref 98–111)
Creatinine, Ser: 1.03 mg/dL — ABNORMAL HIGH (ref 0.44–1.00)
GFR calc Af Amer: 58 mL/min — ABNORMAL LOW
GFR calc non Af Amer: 50 mL/min — ABNORMAL LOW
Glucose, Bld: 126 mg/dL — ABNORMAL HIGH (ref 70–99)
Potassium: 4.3 mmol/L (ref 3.5–5.1)
Sodium: 139 mmol/L (ref 135–145)
Total Bilirubin: 0.5 mg/dL (ref 0.3–1.2)
Total Protein: 5.9 g/dL — ABNORMAL LOW (ref 6.5–8.1)

## 2019-06-16 LAB — SARS CORONAVIRUS 2 BY RT PCR (HOSPITAL ORDER, PERFORMED IN ~~LOC~~ HOSPITAL LAB): SARS Coronavirus 2: NEGATIVE

## 2019-06-16 LAB — TROPONIN I (HIGH SENSITIVITY)
Troponin I (High Sensitivity): 23 ng/L — ABNORMAL HIGH
Troponin I (High Sensitivity): 22 ng/L — ABNORMAL HIGH (ref <18)

## 2019-06-16 LAB — PROTIME-INR
INR: 1.2 (ref 0.8–1.2)
Prothrombin Time: 15.1 s (ref 11.4–15.2)

## 2019-06-16 LAB — T4, FREE: Free T4: 0.68 ng/dL (ref 0.61–1.12)

## 2019-06-16 LAB — PREPARE RBC (CROSSMATCH)

## 2019-06-16 LAB — BRAIN NATRIURETIC PEPTIDE: B Natriuretic Peptide: 379 pg/mL — ABNORMAL HIGH (ref 0.0–100.0)

## 2019-06-16 LAB — APTT: aPTT: 26 seconds (ref 24–36)

## 2019-06-16 MED ORDER — VITAMIN B-12 1000 MCG PO TABS
1000.0000 ug | ORAL_TABLET | Freq: Every day | ORAL | Status: DC
  Administered 2019-06-17 – 2019-06-19 (×3): 1000 ug via ORAL
  Filled 2019-06-16 (×2): qty 1

## 2019-06-16 MED ORDER — FERROUS SULFATE 325 (65 FE) MG PO TABS: 325.0000 mg | ORAL_TABLET | Freq: Every day | ORAL | Status: DC

## 2019-06-16 MED ORDER — FUROSEMIDE 10 MG/ML IJ SOLN
40.0000 mg | Freq: Every day | INTRAMUSCULAR | Status: DC
  Administered 2019-06-16 – 2019-06-19 (×4): 40 mg via INTRAVENOUS
  Filled 2019-06-16 (×2): qty 4

## 2019-06-16 MED ORDER — FUROSEMIDE 10 MG/ML IJ SOLN
40.0000 mg | Freq: Once | INTRAMUSCULAR | Status: DC
  Filled 2019-06-16: qty 4

## 2019-06-16 MED ORDER — INFLUENZA VAC A&B SA ADJ QUAD 0.5 ML IM PRSY
0.5000 mL | PREFILLED_SYRINGE | INTRAMUSCULAR | Status: AC
  Administered 2019-06-17: 10:00:00 0.5 mL via INTRAMUSCULAR
  Filled 2019-06-16: qty 0.5

## 2019-06-16 MED ORDER — AMIODARONE HCL 200 MG PO TABS
200.0000 mg | ORAL_TABLET | Freq: Every day | ORAL | Status: DC
  Administered 2019-06-17 – 2019-06-19 (×3): 200 mg via ORAL
  Filled 2019-06-16 (×3): qty 1

## 2019-06-16 MED ORDER — SIMVASTATIN 20 MG PO TABS
20.0000 mg | ORAL_TABLET | Freq: Every day | ORAL | Status: DC
  Administered 2019-06-16 – 2019-06-18 (×3): 20 mg via ORAL
  Filled 2019-06-16 (×3): qty 1

## 2019-06-16 MED ORDER — SODIUM CHLORIDE 0.9 % IV SOLN
10.0000 mL/h | Freq: Once | INTRAVENOUS | Status: AC
  Administered 2019-06-16: 10 mL/h via INTRAVENOUS

## 2019-06-16 MED ORDER — INSULIN ASPART 100 UNIT/ML ~~LOC~~ SOLN: 0.0000 [IU] | Freq: Every day | SUBCUTANEOUS | Status: DC

## 2019-06-16 MED ORDER — ACETAMINOPHEN 325 MG PO TABS: 650.0000 mg | ORAL_TABLET | Freq: Four times a day (QID) | ORAL | Status: DC | PRN

## 2019-06-16 MED ORDER — SODIUM CHLORIDE 0.9 % IV SOLN
8.0000 mg/h | INTRAVENOUS | Status: AC
  Administered 2019-06-16 – 2019-06-19 (×7): 8 mg/h via INTRAVENOUS
  Filled 2019-06-16 (×6): qty 80

## 2019-06-16 MED ORDER — MECLIZINE HCL 25 MG PO TABS
25.0000 mg | ORAL_TABLET | Freq: Every day | ORAL | Status: DC | PRN
  Filled 2019-06-16: qty 1

## 2019-06-16 MED ORDER — INSULIN ASPART 100 UNIT/ML ~~LOC~~ SOLN
0.0000 [IU] | Freq: Three times a day (TID) | SUBCUTANEOUS | Status: DC
  Administered 2019-06-16: 17:00:00 1 [IU] via SUBCUTANEOUS
  Administered 2019-06-17: 12:00:00 2 [IU] via SUBCUTANEOUS
  Administered 2019-06-18: 3 [IU] via SUBCUTANEOUS
  Administered 2019-06-19: 12:00:00 2 [IU] via SUBCUTANEOUS
  Filled 2019-06-16 (×4): qty 1

## 2019-06-16 MED ORDER — LEVOTHYROXINE SODIUM 100 MCG PO TABS
100.0000 ug | ORAL_TABLET | Freq: Every day | ORAL | Status: DC
  Administered 2019-06-17 – 2019-06-19 (×3): 100 ug via ORAL
  Filled 2019-06-16 (×3): qty 1

## 2019-06-16 MED ORDER — POTASSIUM CHLORIDE ER 10 MEQ PO TBCR
10.0000 meq | EXTENDED_RELEASE_TABLET | Freq: Every day | ORAL | Status: DC
  Administered 2019-06-17 – 2019-06-19 (×3): 10 meq via ORAL
  Filled 2019-06-16 (×5): qty 1

## 2019-06-16 MED ORDER — ONDANSETRON HCL 4 MG PO TABS: 4.0000 mg | ORAL_TABLET | Freq: Three times a day (TID) | ORAL | Status: DC | PRN

## 2019-06-16 MED ORDER — SODIUM CHLORIDE 0.9 % IV SOLN
80.0000 mg | Freq: Once | INTRAVENOUS | Status: AC
  Administered 2019-06-16: 80 mg via INTRAVENOUS
  Filled 2019-06-16: qty 80

## 2019-06-16 NOTE — Progress Notes (Signed)
Care Alignment Note  Advanced Directives Documents (Living Will, Power of Attorney) currently in the EHR no advanced directives documents available .  Has the patient discussed their wishes with their family/healthcare power of attorney yes. How much does the family or healthcare power of attorney know about their wishes. Patient's daughter understands patient's clinical condition including GI bleed.  CHF.  Chronic atrial fibrillation.  She stated patient able to make her own medical decisions and also patient would not want to be placed on life support.  What does the patient/decision maker understand about their medical condition and the natural course of their disease.  GI bleed.  Acute blood loss anemia.  Chronic atrial fibrillation.  Acute on chronic diastolic CHF exacerbation.  What is the patient/decision maker's biggest fear or concern for the future becoming a burden to my family  What is the most important goal for this patient should their health condition worsen maintenance of function.  Current   Code Status: DNR  Current code status has been reviewed/updated.  Time spent:19 minutes

## 2019-06-16 NOTE — ED Notes (Signed)
Paper consent for blood obtained by this RN, this RN and Dr. Jari Pigg signed consent for blood transfusion. Dr. Jari Pigg obtained verbal consent for blood transfusion from patient's daughter Gaspar Garbe via telephone.

## 2019-06-16 NOTE — ED Notes (Signed)
Diane Mullins noted to patient's stool upon rectal assessment by Dr. Jari Pigg.

## 2019-06-16 NOTE — H&P (Addendum)
[place] [facility] [place] NAME: Diane Mullins    MR#:  [id number]  DATE OF BIRTH:  Aug 11, 1933  DATE OF ADMISSION:  06/16/2019  PRIMARY CARE PHYSICIAN: [name], MD   REQUESTING/REFERRING PHYSICIAN: Marjean Donna  CHIEF COMPLAINT:   Chief Complaint  Patient presents with   Altered Mental Status  Generalized weakness  HISTORY OF PRESENT ILLNESS:  Diane Mullins  is a 85 y.o. female with a known history of chronic diastolic CHF, diabetes mellitus, hypertension, hyperlipidemia, chronic atrial fibrillation on anticoagulation with Eliquis as well as chronic dysarthria which daughter reports has been worked up in the past with no acute findings there was brought into the emergency room today with complaints of altered mental status and generalized weakness.  Patient reported to have some coughing and some coarse breath sounds as well.  Oxygen saturation was 94% on room air.  Reported mild shortness of breath.  No chest pain.  Patient evaluated in the emergency room and found to have hemoglobin of 5.1.  Noted to have melanotic stools.  CT scan of the head without contrast done with no acute findings.  Patient diagnosed with acute blood loss anemia secondary to GI bleed.  Daughter reported patient had recent colonoscopy done by gastroenterology service at this facility.  Denies any abdominal pains.  No nausea or vomiting.  Patient being admitted to medical service for further evaluation and management.  Patient awake and alert and oriented at the time of my evaluation apart from her chronic dysarthria.  PAST MEDICAL HISTORY:   Past Medical History:  Diagnosis Date   Atrial fibrillation ([place])    Benign paroxysmal positional vertigo 07/17/2014   Chronic diastolic CHF (congestive heart failure) ([place])    a. echo 10/19: EF of 60-65%, no RWMA, Gr2DD, calcified mitral annulus with mild MR, mildly to moderately dilated left atrium, moderate TR, PASP 52 mmHg     Diabetes mellitus    H/O: rheumatic fever    Hyperlipidemia    Hypertension    Pulmonary hypertension ([place])    Stroke [facility])    TIA Jan. 1st   Teratoma of left ovary    Thyroid disease     PAST SURGICAL HISTORY:   Past Surgical History:  Procedure Laterality Date   ABDOMINAL HYSTERECTOMY  1973   menorrhagia   CARDIOVERSION N/A 10/22/2018   Procedure: CARDIOVERSION;  Surgeon: Nelva Bush, MD;  Location: [facility] ORS;  Service: Cardiovascular;  Laterality: N/A;   CARDIOVERSION N/A 11/26/2018   Procedure: CARDIOVERSION (CATH LAB);  Surgeon: Minna Merritts, MD;  Location: [facility] ORS;  Service: Cardiovascular;  Laterality: N/A;   COLONOSCOPY WITH PROPOFOL N/A 12/16/2018   Procedure: COLONOSCOPY WITH PROPOFOL;  Surgeon: Lin Landsman, MD;  Location: [facility] ENDOSCOPY;  Service: Gastroenterology;  Laterality: N/A;   ENTEROSCOPY N/A 12/16/2018   Procedure: Push ENTEROSCOPY;  Surgeon: Lin Landsman, MD;  Location: [facility] ENDOSCOPY;  Service: Gastroenterology;  Laterality: N/A;   ESOPHAGOGASTRODUODENOSCOPY (EGD) WITH PROPOFOL N/A 12/14/2018   Procedure: ESOPHAGOGASTRODUODENOSCOPY (EGD) WITH PROPOFOL;  Surgeon: Lucilla Lame, MD;  Location: [facility] ENDOSCOPY;  Service: Endoscopy;  Laterality: N/A;   HERNIA REPAIR  1994   ORIF ANKLE FRACTURE Right 10/29/2015   Procedure: OPEN REDUCTION INTERNAL FIXATION (ORIF) ANKLE FRACTURE;  Surgeon: Thornton Park, MD;  Location: [facility] ORS;  Service: Orthopedics;  Laterality: Right;   TEE WITHOUT CARDIOVERSION N/A 10/22/2018   Procedure: TRANSESOPHAGEAL ECHOCARDIOGRAM (TEE);  Surgeon: Nelva Bush, MD;  Location: [facility] ORS;  Service:  Cardiovascular;  Laterality: N/A;   VAGINAL DELIVERY     x4    SOCIAL HISTORY:   Social History   Tobacco Use   Smoking status: Never Smoker   Smokeless tobacco: Never Used  Substance Use Topics   Alcohol use: No    FAMILY HISTORY:   Family History  Problem Relation Age of Onset   Heart  disease Mother 32   Heart attack Mother 70   Cancer Father        colon   Diabetes Sister    Hypertension Sister    Cancer Brother        kidney   Diabetes Brother    Cancer Daughter        ovarian   Non-Hodgkin's lymphoma Son     DRUG ALLERGIES:   Allergies  Allergen Reactions   Oxycodone     Other reaction(s): Confusion Patient's daughter reported confusion/sensitivity to oxycodone.     REVIEW OF SYSTEMS:   Review of Systems  Constitutional: Negative for chills and fever.  HENT: Negative for hearing loss and tinnitus.   Eyes: Negative for blurred vision and double vision.  Respiratory: Positive for cough and shortness of breath. Negative for wheezing.   Cardiovascular: Negative for chest pain.  Gastrointestinal: Positive for melena. Negative for abdominal pain, heartburn, nausea and vomiting.  Genitourinary: Negative for dysuria and urgency.  Musculoskeletal: Negative for myalgias and neck pain.  Skin: Negative for itching and rash.  Neurological: Negative for dizziness and headaches.  Psychiatric/Behavioral: Negative for depression and hallucinations.    MEDICATIONS AT HOME:   Prior to Admission medications   Medication Sig Start Date End Date Taking? Authorizing Provider  acetaminophen (TYLENOL) 325 MG tablet Take 650 mg by mouth every 6 (six) hours as needed for mild pain or moderate pain.    Yes [provider]  amiodarone (PACERONE) 200 MG tablet TAKE ONE TABLET BY MOUTH ONCE DAILY Patient taking differently: Take 200 mg by mouth daily.  05/01/19  Yes [name], MD  apixaban (ELIQUIS) 5 MG TABS tablet Take 1 tablet (5 mg total) by mouth 2 (two) times daily. 05/06/19  Yes Gollan, Kathlene November, MD  ASPIRIN LOW DOSE 81 MG EC tablet TAKE ONE TABLET BY MOUTH ONCE DAILY Patient taking differently: Take 81 mg by mouth daily.  05/09/19  Yes [name], MD  [employer] VITAMIN B12 1000 MCG tablet TAKE 1 TABLET BY MOUTH EVERY DAY Patient taking  differently: Take 1,000 mcg by mouth daily.  03/28/19  Yes [name], MD  cyanocobalamin (,VITAMIN B-12,) 1000 MCG/ML injection Inject 1,000 mcg into the muscle every 30 (thirty) days.   Yes [provider]  FEROSUL 325 (65 Fe) MG tablet TAKE 1 TABLET BY MOUTH EVERY DAY FOR SUPPLEMENT Patient taking differently: Take 325 mg by mouth daily.  05/09/19  Yes [name], MD  furosemide (LASIX) 20 MG tablet TAKE ONE TABLET BY MOUTH ONCE DAILY Patient taking differently: Take 20 mg by mouth daily.  03/24/19  Yes [name], MD  glucose blood (ONE TOUCH ULTRA TEST) test strip Use as instructed to check Blood sugar three times a day.  E11.65 Dispense patient choice. 07/30/18  Yes Guse, Jacquelynn Cree, FNP  Insulin Pen Needle (PEN NEEDLES) 32G X 4 MM MISC Inject 1 Dose into the skin daily. 12/19/16  Yes [name], MD  LEVEMIR FLEXTOUCH 100 UNIT/ML Pen INJECT 15 UNITS SUB-Q EVERY MORNING Patient taking differently: Inject 15 Units into the  skin daily.  06/12/19  Yes [name], MD  levothyroxine (SYNTHROID) 100 MCG tablet TAKE 1 TABLET BY MOUTH 30-60 MINUTES BEFORE BREAKFAST ON AN EMPTY STOMACH FOR THYROID REPLACEMENT Patient taking differently: Take 100 mcg by mouth daily before breakfast.  05/07/19  Yes [name], MD  meclizine (ANTIVERT) 25 MG tablet Take 25 mg by mouth daily as needed for dizziness.   Yes [provider]  metFORMIN (GLUCOPHAGE) 1000 MG tablet Take 1 tablet (1,000 mg total) by mouth 2 (two) times daily with a meal. TAKE 1 TABLET TWICE DAILY  WITH  A  MEAL 04/25/19  Yes [name], MD  omeprazole (PRILOSEC) 20 MG capsule TAKE 1 CAPSULE BY MOUTH EVERY DAY IN [facility] Patient taking differently: Take 20 mg by mouth daily.  04/07/19  Yes [name], MD  ondansetron (ZOFRAN) 4 MG tablet Take 4 mg by mouth every 8 (eight) hours as needed for nausea. 12/04/18  Yes [provider]  polyethylene glycol (MIRALAX / GLYCOLAX)  packet Take 17 g by mouth daily as needed for moderate constipation.    Yes [provider]  potassium chloride (K-DUR) 10 MEQ tablet TAKE 1 TABLET BY MOUTH EVERY DAY FOR SUPPLEMENT Patient taking differently: Take 10 mEq by mouth daily.  05/20/19  Yes [name], MD  simvastatin (ZOCOR) 20 MG tablet TAKE 1 TABLET BY MOUTH EACH NIGHT AT BEDTIME FOR CHOLESTEROL Patient taking differently: Take 20 mg by mouth at bedtime.  04/14/19  Yes [name], MD  Insulin Glargine (LANTUS SOLOSTAR) 100 UNIT/ML Solostar Pen INJECT  17 UNITS SUBCUTANEOUSLY IN THE MORNING Patient not taking: Reported on 06/16/2019 08/27/18   Jodelle Green, FNP      VITAL SIGNS:  Blood pressure (!) 133/40, pulse 76, temperature 98.2 F (36.8 C), temperature source Oral, resp. rate (!) 22, height 5\' 2"  (1.575 m), weight 87 kg, SpO2 100 %.  PHYSICAL EXAMINATION:  Physical Exam  GENERAL:  85 y.o.-year-old patient lying in the bed with no acute distress.  EYES: Pupils equal, round, reactive to light and accommodation. No scleral icterus. Extraocular muscles intact.  HEENT: Head atraumatic, normocephalic. Oropharynx and nasopharynx clear.  NECK:  Supple, no jugular venous distention. No thyroid enlargement, no tenderness.  LUNGS: Normal breath sounds bilaterally, no wheezing, rales,rhonchi or crepitation. No use of accessory muscles of respiration.  CARDIOVASCULAR: S1, S2 normal. No murmurs, rubs, or gallops.  ABDOMEN: Soft, nontender, nondistended. Bowel sounds present. No organomegaly or mass.  EXTREMITIES: No pedal edema, cyanosis, or clubbing.  NEUROLOGIC: Generalized weakness.  Dysarthria which mother reported was chronic and intermittently worse. Gait not checked.  PSYCHIATRIC: The patient is alert and oriented x 3.  SKIN: No obvious rash, lesion, or ulcer.   LABORATORY PANEL:   CBC Recent Labs  Lab 06/16/19 1055  WBC 7.0  HGB 5.1*  HCT 17.2*  PLT 360    ------------------------------------------------------------------------------------------------------------------  Chemistries  Recent Labs  Lab 06/16/19 1055  NA 139  K 4.3  CL 103  CO2 27  GLUCOSE 126*  BUN 19  CREATININE 1.03*  CALCIUM 8.5*  AST 15  ALT 12  ALKPHOS 64  BILITOT 0.5   ------------------------------------------------------------------------------------------------------------------  Cardiac Enzymes No results for input(s): TROPONINI in the last 168 hours. ------------------------------------------------------------------------------------------------------------------  RADIOLOGY:  Dg Chest 1 View  Result Date: 06/16/2019 CLINICAL DATA:  Shortness of breath EXAM: CHEST  1 VIEW COMPARISON:  12/17/2018 FINDINGS: Moderate cardiomegaly, stable. Pulmonary vascular congestion. Increased interstitial markings bilaterally. No focal  airspace consolidation. No large pleural fluid collection. No pneumothorax. IMPRESSION: Findings of CHF with mild interstitial edema. Electronically Signed   By: Davina Poke M.D.   On: 06/16/2019 11:25   Ct Head Wo Contrast  Result Date: 06/16/2019 CLINICAL DATA:  Altered mental status EXAM: CT HEAD WITHOUT CONTRAST TECHNIQUE: Contiguous axial images were obtained from the base of the skull through the vertex without intravenous contrast. COMPARISON:  12/14/2018 FINDINGS: Brain: No evidence of acute infarction, hemorrhage, hydrocephalus, extra-axial collection or mass lesion/mass effect. Scattered low-density changes within the periventricular and subcortical white matter compatible with chronic microvascular ischemic change. Mild diffuse cerebral volume loss. Vascular: Mild atherosclerotic calcifications involving the large vessels of the skull base. No unexpected hyperdense vessel. Skull: Normal. Negative for fracture or focal lesion. Sinuses/Orbits: No acute finding. Other: None. IMPRESSION: 1.  No acute intracranial findings. 2.   Chronic microvascular ischemic change and cerebral volume loss. Electronically Signed   By: Davina Poke M.D.   On: 06/16/2019 11:32      IMPRESSION AND PLAN:  Patient is an 85 year old with history of chronic diastolic CHF, diabetes mellitus, hypertension, hyperlipidemia, chronic atrial fibrillation on anticoagulation with Eliquis as well as chronic dysarthria which daughter reports has been worked up in the past with no acute findings previously being admitted for evaluation of GI bleed  1.  GI bleed Patient reported to have melanotic stools.  Hemoglobin noted to be 5.1.  Last hemoglobin from July 2020 was 10.2. Patient being given bolus of IV Protonix at this time.  To be continued on Protonix drip after a bolus. Monitor serial hemoglobin and hematocrits. Avoiding IV fluid hydration due to findings of CHF as well. Gastroenterology consult placed. Hold off on home dose of aspirin and Eliquis  2.  Acute blood loss anemia with hemoglobin of 5.1 Secondary to GI bleed. Patient to be transfused with 2 units of packed red blood cells with each over 4 hours while monitoring cardiopulmonary status closely. To be given a dose of IV Lasix between each transfusion to prevent fluid overload given findings of mild CHF  3.  Acute on chronic diastolic CHF exacerbation; mild Patient had mild shortness of breath.  Chest x-ray with findings of CHF Being diuresed with IV Lasix. 2D echocardiogram to evaluate cardiac function  4.  Hypothyroidism Continue Synthroid.  TSH level in a.m.  5.  Diabetes mellitus type 2 Since patient currently n.p.o. we will hold off on insulin as well as metformin Placed on sliding scale insulin coverage.  Glycosylated hemoglobin level in a.m.  6.  Chronic dysarthria Intermittently worse than baseline.  Daughter reports this has been extensively worked up in the past with no acute findings.  7.  History of chronic atrial fibrillation Patient on amiodarone. Eliquis  placed on hold due to ongoing evaluation for GI bleed  DVT prophylaxis; SCDs No heparin products due to GI bleed   All the records are reviewed and case discussed with ED provider. Management plans discussed with the patient, family and they are in agreement. I called and updated patient's daughter Mrs. Gaspar Garbe over the phone on treatment plans.  All questions were answered.  She stated patient would not want to be on life support.  And also stated patient able to make her own medical decision.  Patient awake and alert and oriented x3 at this time.  I discussed further with patient who confirmed her CODE STATUS to be DNR/DNI.  CODE STATUS: DNR/DNI  TOTAL TIME TAKING CARE OF THIS  PATIENT: 62 minutes.    Shahzad Thomann M.D on 06/16/2019 at 1:10 PM  Between 7am to 6pm - Pager - [phone]  After 6pm go to [email or web address] - [employer] [facility]  Office  [phone]  CC: Primary care physician; [name], MD   Note: This dictation was prepared with Dragon dictation along with smaller phrase technology. Any transcriptional errors that result from this process are unintentional.

## 2019-06-16 NOTE — ED Provider Notes (Signed)
[facility] Emergency Department Provider Note  ____________________________________________   First MD Initiated Contact with Patient 06/16/19 1048     (approximate)  I have reviewed the triage vital signs and the nursing notes.   HISTORY  Chief Complaint Altered Mental Status    HPI Diane Mullins is a 85 y.o. female with CHF, diabetes, hypertension, hyperlipidemia, A. fib on Eliquis who presents with altered mental status.  Patient is coming from a living facility for increased confusion.  They noted this today.  According to EMS she is also been coughing a lot and had coarse breath sounds.  She was satting 94% on room air.  Patient herself does endorse little bit of shortness of breath.  She is on a blood thinner.  Unclear if she had any falls.  Full HPI is limited due to patient's altered mental status.          Past Medical History:  Diagnosis Date   Atrial fibrillation ([place])    Benign paroxysmal positional vertigo 07/17/2014   Chronic diastolic CHF (congestive heart failure) ([place])    a. echo 10/19: EF of 60-65%, no RWMA, Gr2DD, calcified mitral annulus with mild MR, mildly to moderately dilated left atrium, moderate TR, PASP 52 mmHg   Diabetes mellitus    H/O: rheumatic fever    Hyperlipidemia    Hypertension    Pulmonary hypertension ([place])    Stroke ([place])    TIA Jan. 1st   Teratoma of left ovary    Thyroid disease     Patient Active Problem List   Diagnosis Date Noted   B12 deficiency 04/06/2019   Balance problem 04/06/2019   Gastrointestinal hemorrhage with melena    Symptomatic anemia    Melena 12/11/2018   Constipation 12/10/2018   Atrial flutter ([place]) 10/17/2018   Teratoma of left ovary 08/07/2018   AKI (acute kidney injury) ([place])    Acute on chronic diastolic heart failure ([place])    Respiratory failure ([place]) 07/05/2018   Coronary artery calcification seen on CAT scan 11/16/2017   Morbid obesity  ([place]) 10/30/2017   (HFpEF) heart failure with preserved ejection fraction ([place]) 09/28/2017   Family history of colon cancer 04/17/2017   Closed right ankle fracture 10/28/2015   Fatigue 10/18/2015   TIA (transient ischemic attack) 09/18/2015   Orthostatic hypotension 08/02/2015   Adjustment disorder with anxious mood 03/30/2015   Postmenopausal estrogen deficiency 06/03/2014   DNR (do not resuscitate) discussion 03/04/2014   Gait disturbance 05/07/2013   Diabetes ([place]) 04/21/2013   Shortness of breath 12/09/2012   Hearing loss 12/09/2012   Anxiety 01/31/2012   Hypertension 12/22/2011   Hyperlipidemia 12/22/2011   Hypothyroidism 12/22/2011    Past Surgical History:  Procedure Laterality Date   ABDOMINAL HYSTERECTOMY  1973   menorrhagia   CARDIOVERSION N/A 10/22/2018   Procedure: CARDIOVERSION;  Surgeon: Nelva Bush, MD;  Location: [facility] ORS;  Service: Cardiovascular;  Laterality: N/A;   CARDIOVERSION N/A 11/26/2018   Procedure: CARDIOVERSION (CATH LAB);  Surgeon: Minna Merritts, MD;  Location: [facility] ORS;  Service: Cardiovascular;  Laterality: N/A;   COLONOSCOPY WITH PROPOFOL N/A 12/16/2018   Procedure: COLONOSCOPY WITH PROPOFOL;  Surgeon: Lin Landsman, MD;  Location: [facility] END[facility]COPY;  Service: Gastroenterology;  Laterality: N/A;   ENTEROSCOPY N/A 12/16/2018   Procedure: Push ENTEROSCOPY;  Surgeon: Lin Landsman, MD;  Location: [facility] ENDOSCOPY;  Service: Gastroenterology;  Laterality: N/A;   ESOPHAGOGASTRODUODENOSCOPY (EGD) WITH PROPOFOL N/A 12/14/2018   Procedure: ESOPHAGOGASTRODUODENOSCOPY (EGD) WITH PROPOFOL;  Surgeon: Lucilla Lame, MD;  Location: [facility] ENDOSCOPY;  Service: Endoscopy;  Laterality: N/A;   HERNIA REPAIR  1994   ORIF ANKLE FRACTURE Right 10/29/2015   Procedure: OPEN REDUCTION INTERNAL FIXATION (ORIF) ANKLE FRACTURE;  Surgeon: Thornton Park, MD;  Location: [facility] ORS;  Service: Orthopedics;  Laterality: Right;   TEE WITHOUT  CARDIOVERSION N/A 10/22/2018   Procedure: TRANSESOPHAGEAL ECHOCARDIOGRAM (TEE);  Surgeon: Nelva Bush, MD;  Location: [facility] ORS;  Service: Cardiovascular;  Laterality: N/A;   VAGINAL DELIVERY     x4    Prior to Admission medications   Medication Sig Start Date End Date Taking? Authorizing Provider  acetaminophen (TYLENOL) 325 MG tablet Take 650 mg by mouth every 6 (six) hours as needed for mild pain or moderate pain.     [provider]  amiodarone (PACERONE) 200 MG tablet TAKE ONE TABLET BY MOUTH ONCE DAILY 05/01/19   [name], MD  apixaban (ELIQUIS) 5 MG TABS tablet Take 1 tablet (5 mg total) by mouth 2 (two) times daily. 05/06/19   Minna Merritts, MD  ASPIRIN LOW DOSE 81 MG EC tablet TAKE ONE TABLET BY MOUTH ONCE DAILY 05/09/19   [name], MD  bisacodyl (BISACODYL LAXATIVE) 10 MG suppository Place 10 mg rectally as needed for moderate constipation.    [provider]  [facility] VITAMIN B12 1000 MCG tablet TAKE 1 TABLET BY MOUTH EVERY DAY 03/28/19   [name], MD  FEROSUL 325 (65 Fe) MG tablet TAKE 1 TABLET BY MOUTH EVERY DAY FOR SUPPLEMENT 05/09/19   [name], MD  ferrous sulfate 325 (65 FE) MG EC tablet Take 1 tablet (325 mg total) by mouth daily with breakfast. 08/30/18   Guse, Jacquelynn Cree, FNP  furosemide (LASIX) 20 MG tablet TAKE ONE TABLET BY MOUTH ONCE DAILY 03/24/19   [name], MD  glucose blood (ONE TOUCH ULTRA TEST) test strip Use as instructed to check Blood sugar three times a day.  E11.65 Dispense patient choice. 07/30/18   Jodelle Green, FNP  Insulin Glargine (LANTUS SOLOSTAR) 100 UNIT/ML Solostar Pen INJECT  17 UNITS SUBCUTANEOUSLY IN THE MORNING Patient taking differently: Inject 17 Units into the skin daily.  08/27/18   Jodelle Green, FNP  Insulin Pen Needle (PEN NEEDLES) 32G X 4 MM MISC Inject 1 Dose into the skin daily. 12/19/16   [name], MD  LEVEMIR FLEXTOUCH 100 UNIT/ML Pen INJECT 15 UNITS SUB-Q EVERY  MORNING 06/12/19   [name], MD  levothyroxine (SYNTHROID) 100 MCG tablet TAKE 1 TABLET BY MOUTH 30-60 MINUTES BEFORE BREAKFAST ON AN EMPTY STOMACH FOR THYROID REPLACEMENT 05/07/19   [name], MD  metFORMIN (GLUCOPHAGE) 1000 MG tablet Take 1 tablet (1,000 mg total) by mouth 2 (two) times daily with a meal. TAKE 1 TABLET TWICE DAILY  WITH  A  MEAL 04/25/19   [name], MD  omeprazole (PRILOSEC) 20 MG capsule TAKE 1 CAPSULE BY MOUTH EVERY DAY IN [facility] 04/07/19   [name], MD  ondansetron (ZOFRAN) 4 MG tablet Take 4 mg by mouth every 8 (eight) hours as needed for nausea. 12/04/18   [provider]  polyethylene glycol (MIRALAX / GLYCOLAX) packet Take 17 g by mouth daily as needed for moderate constipation.     [provider]  potassium chloride (K-DUR) 10 MEQ tablet TAKE 1 TABLET BY MOUTH EVERY DAY FOR SUPPLEMENT 05/20/19   [name], MD  simvastatin (ZOCOR) 20 MG tablet  TAKE 1 TABLET BY MOUTH EACH NIGHT AT BEDTIME FOR CHOLESTEROL 04/14/19   [name], MD    Allergies Oxycodone  Family History  Problem Relation Age of Onset   Heart disease Mother 35   Heart attack Mother 13   Cancer Father        colon   Diabetes Sister    Hypertension Sister    Cancer Brother        kidney   Diabetes Brother    Cancer Daughter        ovarian   Non-Hodgkin's lymphoma Son     Social History Social History   Tobacco Use   Smoking status: Never Smoker   Smokeless tobacco: Never Used  Substance Use Topics   Alcohol use: No   Drug use: No      Review of Systems History somewhat limited due to patient's altered mental status.  _________________________   PHYSICAL EXAM:  VITAL SIGNS: Blood pressure (!) 139/48, pulse 75, temperature 98.2 F (36.8 C), temperature source Oral, resp. rate 13, height 5\' 2"  (1.575 m), weight 87 kg, SpO2 95 %.   Constitutional: Alert and pleasantly confused. Eyes: Conjunctivae  are normal. EOMI. Head: Atraumatic. Nose: No congestion/rhinnorhea. Mouth/Throat: Mucous membranes are moist.   Neck: No stridor. Trachea Midline. FROM Cardiovascular: Normal rate, regular rhythm. Grossly normal heart sounds.  Good peripheral circulation. Respiratory: Normal respiratory effort.  No retractions. Lungs CTAB. Gastrointestinal: Soft and nontender. No distention. No abdominal bruits.  Musculoskeletal: No lower extremity tenderness nor edema.  No joint effusions. Neurologic:  Normal speech and language. No gross focal neurologic deficits are appreciated.  Skin:  Skin is warm, dry and intact. No rash noted. Psychiatric: Mood and affect are normal. Speech and behavior are normal. GU: Deferred   ____________________________________________   LABS (all labs ordered are listed, but only abnormal results are displayed)  Labs Reviewed  CBC WITH DIFFERENTIAL/PLATELET - Abnormal; Notable for the following components:      Result Value   RBC 1.88 (*)    Hemoglobin 5.1 (*)    HCT 17.2 (*)    MCHC 29.7 (*)    RDW 19.4 (*)    All other components within normal limits  COMPREHENSIVE METABOLIC PANEL - Abnormal; Notable for the following components:   Glucose, Bld 126 (*)    Creatinine, Ser 1.03 (*)    Calcium 8.5 (*)    Total Protein 5.9 (*)    Albumin 3.2 (*)    GFR calc non Af Amer 50 (*)    GFR calc Af Amer 58 (*)    All other components within normal limits  BRAIN NATRIURETIC PEPTIDE - Abnormal; Notable for the following components:   B Natriuretic Peptide 379.0 (*)    All other components within normal limits  TROPONIN I (HIGH SENSITIVITY) - Abnormal; Notable for the following components:   Troponin I (High Sensitivity) 23 (*)    All other components within normal limits  SARS CORONAVIRUS 2 (HOSPITAL ORDER, [place] [facility])  URINE CULTURE  T4, FREE  URINALYSIS, ROUTINE W REFLEX MICROSCOPIC  PROTIME-INR  APTT  TYPE AND SCREEN  PREPARE RBC  (CROSSMATCH)  TROPONIN I (HIGH SENSITIVITY)   ____________________________________________   ED ECG REPORT I, Vanessa Challis, the attending physician, personally viewed and interpreted this ECG.  EKG is normal sinus rate of 77, no ST elevation, T wave inversion aVL, normal intervals ____________________________________________  RADIOLOGY Robert Bellow, personally viewed and evaluated these images (  plain radiographs) as part of my medical decision making, as well as reviewing the written report by the radiologist.  ED MD interpretation: Chest x-ray with mild edema  Official radiology report(s): Dg Chest 1 View  Result Date: 06/16/2019 CLINICAL DATA:  Shortness of breath EXAM: CHEST  1 VIEW COMPARISON:  12/17/2018 FINDINGS: Moderate cardiomegaly, stable. Pulmonary vascular congestion. Increased interstitial markings bilaterally. No focal airspace consolidation. No large pleural fluid collection. No pneumothorax. IMPRESSION: Findings of CHF with mild interstitial edema. Electronically Signed   By: Davina Poke M.D.   On: 06/16/2019 11:25   Ct Head Wo Contrast  Result Date: 06/16/2019 CLINICAL DATA:  Altered mental status EXAM: CT HEAD WITHOUT CONTRAST TECHNIQUE: Contiguous axial images were obtained from the base of the skull through the vertex without intravenous contrast. COMPARISON:  12/14/2018 FINDINGS: Brain: No evidence of acute infarction, hemorrhage, hydrocephalus, extra-axial collection or mass lesion/mass effect. Scattered low-density changes within the periventricular and subcortical white matter compatible with chronic microvascular ischemic change. Mild diffuse cerebral volume loss. Vascular: Mild atherosclerotic calcifications involving the large vessels of the skull base. No unexpected hyperdense vessel. Skull: Normal. Negative for fracture or focal lesion. Sinuses/Orbits: No acute finding. Other: None. IMPRESSION: 1.  No acute intracranial findings. 2.  Chronic microvascular  ischemic change and cerebral volume loss. Electronically Signed   By: Davina Poke M.D.   On: 06/16/2019 11:32    ____________________________________________   PROCEDURES  Procedure(s) performed (including Critical Care):  .Critical Care Performed by: Vanessa Mineralwells, MD Authorized by: Vanessa Coats, MD   Critical care provider statement:    Critical care time (minutes):  45   Critical care was necessary to treat or prevent imminent or life-threatening deterioration of the following conditions: gi bleed    Critical care was time spent personally by me on the following activities:  Discussions with consultants, evaluation of patient's response to treatment, examination of patient, ordering and performing treatments and interventions, ordering and review of laboratory studies, ordering and review of radiographic studies, pulse oximetry, re-evaluation of patient's condition, obtaining history from patient or surrogate and review of old charts     ____________________________________________   INITIAL IMPRESSION / ASSESSMENT AND PLAN / ED COURSE  Diane Mullins was evaluated in Emergency Department on 06/16/2019 for the symptoms described in the history of present illness. She was evaluated in the context of the global COVID-19 pandemic, which necessitated consideration that the patient might be at risk for infection with the SARS-CoV-2 virus that causes COVID-19. Institutional protocols and algorithms that pertain to the evaluation of patients at risk for COVID-19 are in a state of rapid change based on information released by regulatory bodies including the CDC and federal and state organizations. These policies and algorithms were followed during the patient's care in the ED.    Patient is a 85 year old who presents from home for confusion.  No report of fall given patient is on a blood thinner will get CT head to evaluate for epidural subdural hematoma.  Will get labs evaluate for  electrolyte abnormalities, anemia, AKI.  Will get urine to evaluate for UTI and chest x-ray to evaluate for pneumonia  CT head negative.  Xray mild edema.   Hgb notable to be 5.1.   Attempted to call daughter listed as emergency contact but unable to get answer. Will proceed with transfusion.   We will transfuse 1 unit of blood slowly given patient's mild edema and closely watch patient's respiratory status.  Patient does have  melena on rectal exam.  Will hold blood thinner.  Patient had a recent admission for similar a few months ago.  Rapid COVID done due to concern for needing a procedure.  Patient was given Protonix.  Discussed with hospital team for admission for GI bleed   ____________________________________________   FINAL CLINICAL IMPRESSION(S) / ED DIAGNOSES   Final diagnoses:  Anemia, unspecified type  Melena      MEDICATIONS GIVEN DURING THIS VISIT:  Medications  pantoprazole (PROTONIX) 80 mg in sodium chloride 0.9 % 100 mL IVPB (80 mg Intravenous New Bag/Given 06/16/19 1239)  0.9 %  sodium chloride infusion (has no administration in time range)     ED Discharge Orders    None       Note:  This document was prepared using Dragon voice recognition software and may include unintentional dictation errors.   Vanessa [place], MD 06/16/19 1251

## 2019-06-16 NOTE — Telephone Encounter (Signed)
Form was signed by provider and faxed to [employer] today A confirmation was printed stating that the fax was sent.  Nina,cma

## 2019-06-16 NOTE — ED Notes (Signed)
Admitting MD at bedside at this time.

## 2019-06-16 NOTE — Progress Notes (Signed)
Spoke with [facility] where patient resides, patient has not received flu shot this year.  Will order vaccine.  Clarise Cruz, BSN

## 2019-06-16 NOTE — ED Notes (Signed)
Pt provided with 2 warm blankets per her request. Will continue to monitor for further patient needs.

## 2019-06-16 NOTE — Consult Note (Signed)
Diane Darby, MD [address]  [place], [place] [address]  Main: [phone]  Fax: [phone] Pager: [phone]   Consultation  Referring Provider:     No ref. provider found Primary Care Physician:  [name], MD Primary Gastroenterologist: Dr. Lucilla Lame       Reason for Consultation:     Severe symptomatic anemia, melena  Date of Admission:  06/16/2019 Date of Consultation:  06/16/2019         HPI:   Diane Mullins is a 85 y.o. female with history of A. fib on Eliquis and aspirin 81 mg, metabolic syndrome, CHF who came from home place of [facility] due to altered mental status noticed at her facility.  She was pale on arrival as well as short of breath.  Labs revealed hemoglobin 5.1, MCV 91.5.  Most recent hemoglobin was 10.2 on 04/01/2019.  BUN is elevated compared to baseline. Patient was admitted to [facility] in 11/2018 secondary to severe iron deficiency anemia.  Underwent EGD, colonoscopy as well as push enteroscopy which were unremarkable except for large hiatal hernia.  At that time, patient was recommended to stay on proton pump inhibitor as long as she is on anticoagulation.  Patient also has severe B12 deficiency and currently on B12 injections.  She is also taking oral iron supplements. She is consulted for evaluation of acute anemia  Patient appeared very confused during my encounter with her today 2 units of PRBCs were ordered  NSAIDs: None  Antiplts/Anticoagulants/Anti thrombotics: Aspirin 81 mg as well as Eliquis  GI Procedures: EGD, colonoscopy and small bowel endoscopy 11/2018 - Large hiatal hernia. - Normal stomach. - Normal examined duodenum. - No specimens collected.  - The examined portion of the jejunum was normal. - Normal duodenal bulb, second portion of the duodenum, third portion of the duodenum and fourth portion of the duodenum. - Normal esophagus. - Normal stomach. - No specimens collected  - Preparation of the  colon was fair. - Hemorrhoids found on perianal exam. - The examined portion of the ileum was normal. - One 5 mm polyp in the cecum, removed with a cold snare. Resected and retrieved. - One 6 mm polyp at the appendiceal orifice. Resection not attempted. - One 8 mm polyp in the distal transverse colon, removed with a hot snare. Resected and retrieved. - Severe diverticulosis in the sigmoid colon and in the descending colon. There was no evidence of diverticular bleeding. - Non-bleeding external hemorrhoids.   Past Medical History:  Diagnosis Date   Atrial fibrillation ([place])    Benign paroxysmal positional vertigo 07/17/2014   Chronic diastolic CHF (congestive heart failure) ([place])    a. echo 10/19: EF of 60-65%, no RWMA, Gr2DD, calcified mitral annulus with mild MR, mildly to moderately dilated left atrium, moderate TR, PASP 52 mmHg   Diabetes mellitus    H/O: rheumatic fever    Hyperlipidemia    Hypertension    Pulmonary hypertension ([place])    Stroke [facility])    TIA Jan. 1st   Teratoma of left ovary    Thyroid disease     Past Surgical History:  Procedure Laterality Date   ABDOMINAL HYSTERECTOMY  1973   menorrhagia   CARDIOVERSION N/A 10/22/2018   Procedure: CARDIOVERSION;  Surgeon: Nelva Bush, MD;  Location: [facility] ORS;  Service: Cardiovascular;  Laterality: N/A;   CARDIOVERSION N/A 11/26/2018   Procedure: CARDIOVERSION (CATH LAB);  Surgeon: Minna Merritts, MD;  Location: [facility] ORS;  Service: Cardiovascular;  Laterality: N/A;   COLONOSCOPY WITH PROPOFOL N/A 12/16/2018   Procedure: COLONOSCOPY WITH PROPOFOL;  Surgeon: Lin Landsman, MD;  Location: [facility] ENDOSCOPY;  Service: Gastroenterology;  Laterality: N/A;   ENTEROSCOPY N/A 12/16/2018   Procedure: Push ENTEROSCOPY;  Surgeon: Lin Landsman, MD;  Location: [facility] ENDOSCOPY;  Service: Gastroenterology;  Laterality: N/A;   ESOPHAGOGASTRODUODENOSCOPY (EGD) WITH PROPOFOL N/A 12/14/2018   Procedure:  ESOPHAGOGASTRODUODENOSCOPY (EGD) WITH PROPOFOL;  Surgeon: Lucilla Lame, MD;  Location: [facility] ENDOSCOPY;  Service: Endoscopy;  Laterality: N/A;   HERNIA REPAIR  1994   ORIF ANKLE FRACTURE Right 10/29/2015   Procedure: OPEN REDUCTION INTERNAL FIXATION (ORIF) ANKLE FRACTURE;  Surgeon: Thornton Park, MD;  Location: [facility] ORS;  Service: Orthopedics;  Laterality: Right;   TEE WITHOUT CARDIOVERSION N/A 10/22/2018   Procedure: TRANSESOPHAGEAL ECHOCARDIOGRAM (TEE);  Surgeon: Nelva Bush, MD;  Location: [facility] ORS;  Service: Cardiovascular;  Laterality: N/A;   VAGINAL DELIVERY     x4    Prior to Admission medications   Medication Sig Start Date End Date Taking? Authorizing Provider  acetaminophen (TYLENOL) 325 MG tablet Take 650 mg by mouth every 6 (six) hours as needed for mild pain or moderate pain.    Yes [provider]  amiodarone (PACERONE) 200 MG tablet TAKE ONE TABLET BY MOUTH ONCE DAILY Patient taking differently: Take 200 mg by mouth daily.  05/01/19  Yes [name], MD  apixaban (ELIQUIS) 5 MG TABS tablet Take 1 tablet (5 mg total) by mouth 2 (two) times daily. 05/06/19  Yes Gollan, Kathlene November, MD  ASPIRIN LOW DOSE 81 MG EC tablet TAKE ONE TABLET BY MOUTH ONCE DAILY Patient taking differently: Take 81 mg by mouth daily.  05/09/19  Yes [name], MD  [employer] VITAMIN B12 1000 MCG tablet TAKE 1 TABLET BY MOUTH EVERY DAY Patient taking differently: Take 1,000 mcg by mouth daily.  03/28/19  Yes [name], MD  cyanocobalamin (,VITAMIN B-12,) 1000 MCG/ML injection Inject 1,000 mcg into the muscle every 30 (thirty) days.   Yes [provider]  FEROSUL 325 (65 Fe) MG tablet TAKE 1 TABLET BY MOUTH EVERY DAY FOR SUPPLEMENT Patient taking differently: Take 325 mg by mouth daily.  05/09/19  Yes [name], MD  furosemide (LASIX) 20 MG tablet TAKE ONE TABLET BY MOUTH ONCE DAILY Patient taking differently: Take 20 mg by mouth daily.  03/24/19  Yes [name], MD  glucose blood (ONE TOUCH ULTRA TEST) test strip Use as instructed to check Blood sugar three times a day.  E11.65 Dispense patient choice. 07/30/18  Yes Guse, Jacquelynn Cree, FNP  Insulin Pen Needle (PEN NEEDLES) 32G X 4 MM MISC Inject 1 Dose into the skin daily. 12/19/16  Yes [name], MD  LEVEMIR FLEXTOUCH 100 UNIT/ML Pen INJECT 15 UNITS SUB-Q EVERY MORNING Patient taking differently: Inject 15 Units into the skin daily.  06/12/19  Yes [name], MD  levothyroxine (SYNTHROID) 100 MCG tablet TAKE 1 TABLET BY MOUTH 30-60 MINUTES BEFORE BREAKFAST ON AN EMPTY STOMACH FOR THYROID REPLACEMENT Patient taking differently: Take 100 mcg by mouth daily before breakfast.  05/07/19  Yes [name], MD  meclizine (ANTIVERT) 25 MG tablet Take 25 mg by mouth daily as needed for dizziness.   Yes [provider]  metFORMIN (GLUCOPHAGE) 1000 MG tablet Take 1 tablet (1,000 mg total) by mouth 2 (two) times daily with a meal. TAKE 1 TABLET TWICE DAILY  WITH  A  MEAL 04/25/19  Yes [name], MD  ondansetron (ZOFRAN) 4 MG tablet Take 4 mg by mouth every 8 (eight) hours as needed for nausea. 12/04/18  Yes [provider]  polyethylene glycol (MIRALAX / GLYCOLAX) packet Take 17 g by mouth daily as needed for moderate constipation.    Yes [provider]  potassium chloride (K-DUR) 10 MEQ tablet TAKE 1 TABLET BY MOUTH EVERY DAY FOR SUPPLEMENT Patient taking differently: Take 10 mEq by mouth daily.  05/20/19  Yes [name], MD  simvastatin (ZOCOR) 20 MG tablet TAKE 1 TABLET BY MOUTH EACH NIGHT AT BEDTIME FOR CHOLESTEROL Patient taking differently: Take 20 mg by mouth at bedtime.  04/14/19  Yes [name], MD  Insulin Glargine (LANTUS SOLOSTAR) 100 UNIT/ML Solostar Pen INJECT  17 UNITS SUBCUTANEOUSLY IN THE MORNING Patient not taking: Reported on 06/16/2019 08/27/18   Jodelle Green, FNP  omeprazole (PRILOSEC) 20 MG capsule TAKE 1 CAPSULE BY MOUTH  EVERY DAY IN [facility] 06/16/19   [name], MD   Current Facility-Administered Medications:    acetaminophen (TYLENOL) tablet 650 mg, 650 mg, Oral, Q6H PRN, Stark Jock, Jude, MD   [START ON 06/17/2019] amiodarone (PACERONE) tablet 200 mg, 200 mg, Oral, Daily, Ojie, Jude, MD   furosemide (LASIX) injection 40 mg, 40 mg, Intravenous, Daily, Ojie, Jude, MD, 40 mg at 06/16/19 1522   furosemide (LASIX) injection 40 mg, 40 mg, Intravenous, Once, Ojie, Jude, MD   [START ON 06/17/2019] influenza vaccine adjuvanted (FLUAD) injection 0.5 mL, 0.5 mL, Intramuscular, Tomorrow-1000, Ojie, Jude, MD   insulin aspart (novoLOG) injection 0-5 Units, 0-5 Units, Subcutaneous, QHS, Ojie, Jude, MD   insulin aspart (novoLOG) injection 0-9 Units, 0-9 Units, Subcutaneous, TID WC, Ojie, Jude, MD   [START ON 06/17/2019] levothyroxine (SYNTHROID) tablet 100 mcg, 100 mcg, Oral, QAC breakfast, Ojie, Jude, MD   meclizine (ANTIVERT) tablet 25 mg, 25 mg, Oral, Daily PRN, Ojie, Jude, MD   ondansetron (ZOFRAN) tablet 4 mg, 4 mg, Oral, Q8H PRN, Ojie, Jude, MD   pantoprazole (PROTONIX) 80 mg in sodium chloride 0.9 % 250 mL (0.32 mg/mL) infusion, 8 mg/hr, Intravenous, Continuous, Ojie, Jude, MD, Last Rate: 25 mL/hr at 06/16/19 1608, 8 mg/hr at 06/16/19 1608   [START ON 06/17/2019] potassium chloride (K-DUR) CR tablet 10 mEq, 10 mEq, Oral, Daily, Ojie, Jude, MD   simvastatin (ZOCOR) tablet 20 mg, 20 mg, Oral, QHS, Ojie, Jude, MD   [START ON 06/17/2019] vitamin B-12 (CYANOCOBALAMIN) tablet 1,000 mcg, 1,000 mcg, Oral, Daily, Ojie, Jude, MD   Family History  Problem Relation Age of Onset   Heart disease Mother 48   Heart attack Mother 15   Cancer Father        colon   Diabetes Sister    Hypertension Sister    Cancer Brother        kidney   Diabetes Brother    Cancer Daughter        ovarian   Non-Hodgkin's lymphoma Son      Social History   Tobacco Use   Smoking status: Never Smoker   Smokeless  tobacco: Never Used  Substance Use Topics   Alcohol use: No   Drug use: No    Allergies as of 06/16/2019 - Review Complete 06/16/2019  Allergen Reaction Noted   Oxycodone  10/17/2016    Review of Systems:    All systems reviewed and negative except where noted in HPI.   Physical Exam:  Vital signs in last 24 hours:  Temp:  [98.2 F (36.8 C)-98.7 F (37.1 C)] 98.7 F (37.1 C) (09/28 1500) Pulse Rate:  [65-88] 68 (09/28 1500) Resp:  [13-22] 20 (09/28 1500) BP: (116-139)/(40-58) 116/47 (09/28 1500) SpO2:  [94 %-100 %] 99 % (09/28 1441) Weight:  [62.9 kg-87 kg] 62.9 kg (09/28 1500) Last BM Date: 06/16/19 General:   Nonverbal, confused, in NAD Head:  Normocephalic and atraumatic. Eyes:   No icterus.   Conjunctiva pale. PERRLA. Ears:  Normal auditory acuity. Neck:  Supple; no masses or thyroidomegaly Lungs: Respirations even and unlabored. Lungs clear to auscultation bilaterally.   No wheezes, crackles, or rhonchi.  Heart:  Regular rate and rhythm;  Without murmur, clicks, rubs or gallops Abdomen:  Soft, nondistended, nontender. Normal bowel sounds. No appreciable masses or hepatomegaly.  No rebound or guarding.  Rectal: Confirmed melena Msk:  Symmetrical without gross deformities.  Strength normal Extremities:  Without edema, cyanosis or clubbing. Neurologic:  Alert and oriented x0;  grossly normal neurologically. Skin:  Intact without significant lesions or rashes. Psych:  Alert and cooperative.  Altered mental status  LAB RESULTS: CBC Latest Ref Rng & Units 06/16/2019 04/01/2019 12/17/2018  WBC 4.0 - 10.5 K/uL 7.0 6.3 6.7  Hemoglobin 12.0 - 15.0 g/dL 5.1(L) 10.2(L) 9.2(L)  Hematocrit 36.0 - 46.0 % 17.2(L) 32.5(L) 29.8(L)  Platelets 150 - 400 K/uL 360 278.0 342    BMET BMP Latest Ref Rng & Units 06/16/2019 04/01/2019 12/17/2018  Glucose 70 - 99 mg/dL 126(H) 121(H) 154(H)  BUN 8 - 23 mg/dL 19 13 <5(L)  Creatinine 0.44 - 1.00 mg/dL 1.03(H) 1.02 0.97  BUN/Creat Ratio 12 -  28 - - -  Sodium 135 - 145 mmol/L 139 140 141  Potassium 3.5 - 5.1 mmol/L 4.3 4.5 4.0  Chloride 98 - 111 mmol/L 103 100 107  CO2 22 - 32 mmol/L '27 31 25  ' Calcium 8.9 - 10.3 mg/dL 8.5(L) 8.9 9.0    LFT Hepatic Function Latest Ref Rng & Units 06/16/2019 04/01/2019 12/17/2018  Total Protein 6.5 - 8.1 g/dL 5.9(L) 6.2 6.2(L)  Albumin 3.5 - 5.0 g/dL 3.2(L) 4.0 3.4(L)  AST 15 - 41 U/L '15 13 18  ' ALT 0 - 44 U/L '12 10 14  ' Alk Phosphatase 38 - 126 U/L 64 75 84  Total Bilirubin 0.3 - 1.2 mg/dL 0.5 0.2 0.7  Bilirubin, Direct 0.0 - 0.2 mg/dL - - -     STUDIES: Dg Chest 1 View  Result Date: 06/16/2019 CLINICAL DATA:  Shortness of breath EXAM: CHEST  1 VIEW COMPARISON:  12/17/2018 FINDINGS: Moderate cardiomegaly, stable. Pulmonary vascular congestion. Increased interstitial markings bilaterally. No focal airspace consolidation. No large pleural fluid collection. No pneumothorax. IMPRESSION: Findings of CHF with mild interstitial edema. Electronically Signed   By: Davina Poke M.D.   On: 06/16/2019 11:25   Ct Head Wo Contrast  Result Date: 06/16/2019 CLINICAL DATA:  Altered mental status EXAM: CT HEAD WITHOUT CONTRAST TECHNIQUE: Contiguous axial images were obtained from the base of the skull through the vertex without intravenous contrast. COMPARISON:  12/14/2018 FINDINGS: Brain: No evidence of acute infarction, hemorrhage, hydrocephalus, extra-axial collection or mass lesion/mass effect. Scattered low-density changes within the periventricular and subcortical white matter compatible with chronic microvascular ischemic change. Mild diffuse cerebral volume loss. Vascular: Mild atherosclerotic calcifications involving the large vessels of the skull base. No unexpected hyperdense vessel. Skull: Normal. Negative for fracture or focal lesion. Sinuses/Orbits: No acute finding. Other: None. IMPRESSION: 1.  No acute intracranial findings. 2.  Chronic microvascular ischemic  change and cerebral volume loss.  Electronically Signed   By: Davina Poke M.D.   On: 06/16/2019 11:32      Impression / Plan:   Diane Mullins is a 85 y.o. female with metabolic syndrome, CHF, A. fib on Eliquis, aspirin 81 mg, history of chronic iron deficiency and B12 deficiency anemia admitted with under mental status in setting of severe acute on chronic anemia.  Rectal exam confirmed melena She has large hiatal hernia.  Differentials include peptic ulcer disease or Cameron lesions or small bowel AVMs  Start pantoprazole drip Blood transfusion to keep hemoglobin greater than 8 Monitor CBC every 6-8 hours Clear liquid diet if mental status is cleared any Continue to hold Eliquis and aspirin 81 mg Tentative plan for upper endoscopy on 9/30.  If negative, plan for video capsule endoscopy Recommend long-term PPI as long as patient is on anticoagulation Appreciate cardiology input if she can come off either Eliquis or aspirin 81 mg or both as patient has developed GI bleed twice within last 1 year Iron and B12 replacement  Polyp in the appendiceal orifice Patient had a colonoscopy in 11/2018 which revealed 6 mm sessile polyp in the appendiceal orifice which was not resected due to risk for perforation This polyp need to be addressed by surgery as outpatient    Thank you for involving me in the care of this patient.  GI will follow along with you    LOS: 0 days   Sherri Sear, MD  06/16/2019, 4:12 PM   Note: This dictation was prepared with Dragon dictation along with smaller phrase technology. Any transcriptional errors that result from this process are unintentional.

## 2019-06-16 NOTE — ED Triage Notes (Signed)
PT arrives from [facility] with concerns of ams present this morning upon assessment. Pt a&o x 4 at baseline per facility, pt alert to person in triage. Pt pale upon assessment.

## 2019-06-17 ENCOUNTER — Inpatient Hospital Stay (HOSPITAL_COMMUNITY)
Admit: 2019-06-17 | Discharge: 2019-06-17 | Disposition: A | Discharge status: Home / Self Care | Payer: PPO | Attending: Internal Medicine | Admitting: Internal Medicine

## 2019-06-17 DIAGNOSIS — D519 Vitamin B12 deficiency anemia, unspecified: Secondary | ICD-10-CM

## 2019-06-17 DIAGNOSIS — I340 Nonrheumatic mitral (valve) insufficiency: Secondary | ICD-10-CM

## 2019-06-17 DIAGNOSIS — I361 Nonrheumatic tricuspid (valve) insufficiency: Secondary | ICD-10-CM

## 2019-06-17 LAB — TYPE AND SCREEN
ABO/RH(D): O POS
Antibody Screen: NEGATIVE
Unit division: 0
Unit division: 0

## 2019-06-17 LAB — BPAM RBC
Blood Product Expiration Date: 202009292359
Blood Product Expiration Date: 202010282359
ISSUE DATE / TIME: 202009281441
ISSUE DATE / TIME: 202009282001
Unit Type and Rh: 5100
Unit Type and Rh: 9500

## 2019-06-17 LAB — HEMOGLOBIN AND HEMATOCRIT, BLOOD
HCT: 23.9 % — ABNORMAL LOW (ref 36.0–46.0)
Hemoglobin: 7.7 g/dL — ABNORMAL LOW (ref 12.0–15.0)

## 2019-06-17 LAB — GLUCOSE, CAPILLARY
Glucose-Capillary: 110 mg/dL — ABNORMAL HIGH (ref 70–99)
Glucose-Capillary: 161 mg/dL — ABNORMAL HIGH (ref 70–99)
Glucose-Capillary: 110 mg/dL — ABNORMAL HIGH (ref 70–99)
Glucose-Capillary: 147 mg/dL — ABNORMAL HIGH (ref 70–99)

## 2019-06-17 LAB — BASIC METABOLIC PANEL
Anion gap: 10 (ref 5–15)
BUN: 16 mg/dL (ref 8–23)
CO2: 27 mmol/L (ref 22–32)
Calcium: 8.4 mg/dL — ABNORMAL LOW (ref 8.9–10.3)
Chloride: 104 mmol/L (ref 98–111)
Creatinine, Ser: 1.19 mg/dL — ABNORMAL HIGH (ref 0.44–1.00)
GFR calc Af Amer: 49 mL/min — ABNORMAL LOW (ref >60)
GFR calc non Af Amer: 42 mL/min — ABNORMAL LOW (ref >60)
Glucose, Bld: 108 mg/dL — ABNORMAL HIGH (ref 70–99)
Potassium: 3.9 mmol/L (ref 3.5–5.1)
Sodium: 141 mmol/L (ref 135–145)

## 2019-06-17 LAB — CBC
HCT: 24.7 % — ABNORMAL LOW (ref 36.0–46.0)
Hemoglobin: 7.8 g/dL — ABNORMAL LOW (ref 12.0–15.0)
MCH: 28.3 pg (ref 26.0–34.0)
MCHC: 31.6 g/dL (ref 30.0–36.0)
MCV: 89.5 fL (ref 80.0–100.0)
Platelets: 339 10*3/uL (ref 150–400)
RBC: 2.76 MIL/uL — ABNORMAL LOW (ref 3.87–5.11)
RDW: 17.4 % — ABNORMAL HIGH (ref 11.5–15.5)
WBC: 9.2 10*3/uL (ref 4.0–10.5)
nRBC: 1 % — ABNORMAL HIGH (ref 0.0–0.2)

## 2019-06-17 LAB — URINE CULTURE: Culture: NO GROWTH

## 2019-06-17 LAB — FERRITIN: Ferritin: 16 ng/mL (ref 11–307)

## 2019-06-17 LAB — ECHOCARDIOGRAM COMPLETE
Height: 61 in
Weight: 2218.71 oz

## 2019-06-17 LAB — FOLATE: Folate: 12.1 ng/mL (ref >5.9)

## 2019-06-17 LAB — HEMOGLOBIN A1C
Hgb A1c MFr Bld: 5.6 % (ref 4.8–5.6)
Mean Plasma Glucose: 114.02 mg/dL

## 2019-06-17 LAB — MAGNESIUM: Magnesium: 1.8 mg/dL (ref 1.7–2.4)

## 2019-06-17 LAB — VITAMIN B12: Vitamin B-12: 160 pg/mL — ABNORMAL LOW (ref 180–914)

## 2019-06-17 LAB — T4, FREE: Free T4: 0.7 ng/dL (ref 0.61–1.12)

## 2019-06-17 LAB — TSH: TSH: 21.314 u[IU]/mL — ABNORMAL HIGH (ref 0.350–4.500)

## 2019-06-17 MED ORDER — CYANOCOBALAMIN 1000 MCG/ML IJ SOLN
1000.0000 ug | Freq: Once | INTRAMUSCULAR | Status: AC
  Administered 2019-06-17: 16:00:00 1000 ug via SUBCUTANEOUS
  Filled 2019-06-17: qty 1

## 2019-06-17 MED ORDER — SODIUM CHLORIDE 0.9 % IV SOLN
510.0000 mg | Freq: Once | INTRAVENOUS | Status: AC
  Administered 2019-06-17: 510 mg via INTRAVENOUS
  Filled 2019-06-17: qty 17

## 2019-06-17 NOTE — TOC Initial Note (Signed)
Transition of Care [facility]) - Initial/Assessment Note    Patient Details  Name: Diane Mullins MRN: [id number] Date of Birth: 1932/10/08  Transition of Care [facility]) CM/SW Contact:    Candie Chroman, LCSW Phone Number: 06/17/2019, 9:56 AM  Clinical Narrative: CSW met with patient. No supports at bedside. CSW introduced role and explained that discharge planning would be discussed. Patient a little hard of hearing. Patient confirmed she is from [facility] and plans to return at discharge. No further concerns. CSW encouraged patient to contact CSW as needed. CSW will continue to follow patient for support and facilitate return to ALF when stable.                 Expected Discharge Plan: Assisted Living Barriers to Discharge: Continued Medical Work up   Patient Goals and CMS Choice     Choice offered to / list presented to : NA  Expected Discharge Plan and Services Expected Discharge Plan: Assisted Living       Living arrangements for the past 2 months: [place]                                      Prior Living Arrangements/Services Living arrangements for the past 2 months: [place] Lives with:: Facility Resident Patient language and need for interpreter reviewed:: Yes Do you feel safe going back to the place where you live?: Yes      Need for Family Participation in Patient Care: Yes (Comment) Care giver support system in place?: Yes (comment)   Criminal Activity/Legal Involvement Pertinent to Current Situation/Hospitalization: No - Comment as needed  Activities of Daily Living Home Assistive Devices/Equipment: Wheelchair(Resides at [facility]) ADL Screening (condition at time of admission) Patient's cognitive ability adequate to safely complete daily activities?: Yes Is the patient deaf or have difficulty hearing?: Yes Does the patient have difficulty seeing, even when wearing glasses/contacts?: No Does the patient have  difficulty concentrating, remembering, or making decisions?: No Patient able to express need for assistance with ADLs?: Yes Does the patient have difficulty dressing or bathing?: Yes Independently performs ADLs?: No Communication: Independent Dressing (OT): Needs assistance Is this a change from baseline?: Pre-admission baseline Grooming: Needs assistance Is this a change from baseline?: Pre-admission baseline Feeding: Independent Bathing: Needs assistance Is this a change from baseline?: Pre-admission baseline Toileting: Needs assistance Is this a change from baseline?: Pre-admission baseline In/Out Bed: Needs assistance Is this a change from baseline?: Pre-admission baseline Walks in Home: Needs assistance Is this a change from baseline?: Pre-admission baseline Does the patient have difficulty walking or climbing stairs?: Yes Weakness of Legs: Both Weakness of Arms/Hands: None  Permission Sought/Granted Permission sought to share information with : Chartered certified accountant granted to share information with : Yes, Verbal Permission Granted     Permission granted to share info w AGENCY: Home Place of [facility]        Emotional Assessment Appearance:: Appears stated age Attitude/Demeanor/Rapport: Engaged, Gracious Affect (typically observed): Accepting, Appropriate, Calm, Pleasant Orientation: : Oriented to Self, Oriented to Place, Oriented to  Time, Oriented to Situation Alcohol / Substance Use: Never Used Psych Involvement: No (comment)  Admission diagnosis:  Melena [K92.1] Anemia, unspecified type [D64.9] Patient Active Problem List   Diagnosis Date Noted  . GI bleed 06/16/2019  . B12 deficiency 04/06/2019  . Balance problem 04/06/2019  . Gastrointestinal hemorrhage with  melena   . Symptomatic anemia   . Melena 12/11/2018  . Constipation 12/10/2018  . Atrial flutter ([place]) 10/17/2018  . 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 AKI (acute kidney  injury) ([place])   . Acute on chronic diastolic heart failure ([place])   . Respiratory failure ([place]) 07/05/2018  . Coronary artery calcification seen on CAT scan 11/16/2017  . Morbid obesity ([place]) 10/30/2017  . (HFpEF) heart failure with preserved ejection fraction ([place]) 09/28/2017  . Family history of colon cancer 04/17/2017  . Closed right ankle fracture 10/28/2015  . Fatigue 10/18/2015  . TIA (transient ischemic attack) 09/18/2015  . Orthostatic hypotension 08/02/2015  . Adjustment disorder with anxious mood 03/30/2015  . Postmenopausal estrogen deficiency 06/03/2014  . DNR (do not resuscitate) discussion 03/04/2014  . Gait disturbance 05/07/2013  . Diabetes ([place]) 04/21/2013  . Shortness of breath 12/09/2012  . Hearing loss 12/09/2012  . Anxiety 01/31/2012  . Hypertension 12/22/2011  . Hyperlipidemia 12/22/2011  . Hypothyroidism 12/22/2011   PCP:  [name], MD Pharmacy:   [place], [place] - [address] [place] [address] Phone: [phone] Fax: [phone]     Social Determinants of Health (SDOH) Interventions    Readmission Risk Interventions Readmission Risk Prevention Plan 12/16/2018 12/15/2018 12/14/2018  Transportation Screening Complete Complete Complete  PCP or Specialist Appt within 5-7 Days - - -  PCP or Specialist Appt within 3-5 Days Complete Patient refused -  Home Care Screening - - -  Medication Review (RN CM) - - -  [place] or Home Care Consult Complete Complete -  Social Work Consult for [place] Planning/Counseling Complete Complete -  Palliative Care Screening Not Applicable Not Applicable -  Medication Review Press photographer) Complete Complete Not Complete  Med Review Comments - - Review upon discharge  PCP or Specialist appointment within 3-5 days of discharge - - Not Complete  PCP/Specialist Appt Not Complete comments - - Review prior to discharge for appointment with cardiology   [place] or [place] - - Complete  SW Recovery Care/Counseling Consult - - Complete  [place] - - Complete  Some recent data might be hidden

## 2019-06-17 NOTE — Progress Notes (Signed)
*  PRELIMINARY RESULTS* Echocardiogram 2D Echocardiogram has been performed.  Diane Mullins 06/17/2019, 9:23 AM

## 2019-06-17 NOTE — Progress Notes (Signed)
Diane Darby, MD [address]  [place]  [place], [place] [address]  Main: [phone]  Fax: [phone] Pager: [phone]   Subjective: No acute events overnight, did not have bowel movements today   Objective: Vital signs in last 24 hours: Vitals:   06/16/19 2024 06/17/19 0409 06/17/19 0749 06/17/19 1600  BP: (!) 124/50 118/64 (!) 129/47 139/60  Pulse: 66 67 69 74  Resp: 20 18 16 18   Temp: 98.4 F (36.9 C) 98.6 F (37 C) 98.6 F (37 C) 98.2 F (36.8 C)  TempSrc: Oral Oral  Oral  SpO2: (!) 88% 93% 93% 94%  Weight:      Height:       Weight change:   Intake/Output Summary (Last 24 hours) at 06/17/2019 1702 Last data filed at 06/17/2019 1138 Gross per 24 hour  Intake 1847.05 ml  Output 1400 ml  Net 447.05 ml     Exam: Heart:: Regular rate and rhythm or S1S2 present Lungs: normal and clear to auscultation Abdomen: soft, nontender, normal bowel sounds   Lab Results: CBC Latest Ref Rng & Units 06/17/2019 06/17/2019 06/16/2019  WBC 4.0 - 10.5 K/uL 9.2 - 7.0  Hemoglobin 12.0 - 15.0 g/dL 7.8(L) 7.7(L) 5.1(L)  Hematocrit 36.0 - 46.0 % 24.7(L) 23.9(L) 17.2(L)  Platelets 150 - 400 K/uL 339 - 360   CMP Latest Ref Rng & Units 06/17/2019 06/16/2019 04/01/2019  Glucose 70 - 99 mg/dL 108(H) 126(H) 121(H)  BUN 8 - 23 mg/dL 16 19 13   Creatinine 0.44 - 1.00 mg/dL 1.19(H) 1.03(H) 1.02  Sodium 135 - 145 mmol/L 141 139 140  Potassium 3.5 - 5.1 mmol/L 3.9 4.3 4.5  Chloride 98 - 111 mmol/L 104 103 100  CO2 22 - 32 mmol/L 27 27 31   Calcium 8.9 - 10.3 mg/dL 8.4(L) 8.5(L) 8.9  Total Protein 6.5 - 8.1 g/dL - 5.9(L) 6.2  Total Bilirubin 0.3 - 1.2 mg/dL - 0.5 0.2  Alkaline Phos 38 - 126 U/L - 64 75  AST 15 - 41 U/L - 15 13  ALT 0 - 44 U/L - 12 10    Micro Results: Recent Results (from the past 240 hour(s))  SARS Coronavirus 2 [facility] order, Performed in [facility] hospital lab) Nasopharyngeal Nasopharyngeal Swab     Status: None   Collection Time: 06/16/19  10:55 AM   Specimen: Nasopharyngeal Swab  Result Value Ref Range Status   SARS Coronavirus 2 NEGATIVE NEGATIVE Final    Comment: (NOTE) If result is NEGATIVE SARS-CoV-2 target nucleic acids are NOT DETECTED. The SARS-CoV-2 RNA is generally detectable in upper and lower  respiratory specimens during the acute phase of infection. The lowest  concentration of SARS-CoV-2 viral copies this assay can detect is 250  copies / mL. A negative result does not preclude SARS-CoV-2 infection  and should not be used as the sole basis for treatment or other  patient management decisions.  A negative result may occur with  improper specimen collection / handling, submission of specimen other  than nasopharyngeal swab, presence of viral mutation(s) within the  areas targeted by this assay, and inadequate number of viral copies  (<250 copies / mL). A negative result must be combined with clinical  observations, patient history, and epidemiological information. If result is POSITIVE SARS-CoV-2 target nucleic acids are DETECTED. The SARS-CoV-2 RNA is generally detectable in upper and lower  respiratory specimens dur ing the acute phase of infection.  Positive  results are indicative of active infection with  SARS-CoV-2.  Clinical  correlation with patient history and other diagnostic information is  necessary to determine patient infection status.  Positive results do  not rule out bacterial infection or co-infection with other viruses. If result is PRESUMPTIVE POSTIVE SARS-CoV-2 nucleic acids MAY BE PRESENT.   A presumptive positive result was obtained on the submitted specimen  and confirmed on repeat testing.  While 2019 novel coronavirus  (SARS-CoV-2) nucleic acids may be present in the submitted sample  additional confirmatory testing may be necessary for epidemiological  and / or clinical management purposes  to differentiate between  SARS-CoV-2 and other Sarbecovirus currently known to infect  humans.  If clinically indicated additional testing with an alternate test  methodology [phone]) is advised. The SARS-CoV-2 RNA is generally  detectable in upper and lower respiratory sp ecimens during the acute  phase of infection. The expected result is Negative. Fact Sheet for Patients:  StrictlyIdeas.no Fact Sheet for Healthcare Providers: BankingDealers.co.za This test is not yet approved or cleared by the [place] FDA and has been authorized for detection and/or diagnosis of SARS-CoV-2 by FDA under an Emergency Use Authorization (EUA).  This EUA will remain in effect (meaning this test can be used) for the duration of the COVID-19 declaration under Section 564(b)(1) of the Act, 21 U.S.C. section 360bbb-3(b)(1), unless the authorization is terminated or revoked sooner. Performed at [facility], [address] [place], [place] [address]   Urine culture     Status: None   Collection Time: 06/16/19 12:53 PM   Specimen: Urine, Clean Catch  Result Value Ref Range Status   Specimen Description   Final    URINE, CLEAN CATCH Performed at [facility], [address] [place], [place] [address]    Special Requests   Final    NONE Performed at [facility], [address], [place], [place] [address]    Culture   Final    NO GROWTH Performed at [facility], [place] [address], [place], [place] [address]    Report Status 06/17/2019 FINAL  Final   Studies/Results: Dg Chest 1 View  Result Date: 06/16/2019 CLINICAL DATA:  Shortness of breath EXAM: CHEST  1 VIEW COMPARISON:  12/17/2018 FINDINGS: Moderate cardiomegaly, stable. Pulmonary vascular congestion. Increased interstitial markings bilaterally. No focal airspace consolidation. No large pleural fluid collection. No pneumothorax. IMPRESSION: Findings of CHF with mild interstitial edema. Electronically Signed   By: Davina Poke M.D.   On:  06/16/2019 11:25   Ct Head Wo Contrast  Result Date: 06/16/2019 CLINICAL DATA:  Altered mental status EXAM: CT HEAD WITHOUT CONTRAST TECHNIQUE: Contiguous axial images were obtained from the base of the skull through the vertex without intravenous contrast. COMPARISON:  12/14/2018 FINDINGS: Brain: No evidence of acute infarction, hemorrhage, hydrocephalus, extra-axial collection or mass lesion/mass effect. Scattered low-density changes within the periventricular and subcortical white matter compatible with chronic microvascular ischemic change. Mild diffuse cerebral volume loss. Vascular: Mild atherosclerotic calcifications involving the large vessels of the skull base. No unexpected hyperdense vessel. Skull: Normal. Negative for fracture or focal lesion. Sinuses/Orbits: No acute finding. Other: None. IMPRESSION: 1.  No acute intracranial findings. 2.  Chronic microvascular ischemic change and cerebral volume loss. Electronically Signed   By: Davina Poke M.D.   On: 06/16/2019 11:32   Medications:  I have reviewed the patient's current medications. Prior to Admission:  Medications Prior to Admission  Medication Sig Dispense Refill Last Dose  . acetaminophen (TYLENOL) 325 MG tablet Take  650 mg by mouth every 6 (six) hours as needed for mild pain or moderate pain.    Unknown at PRN  . amiodarone (PACERONE) 200 MG tablet TAKE ONE TABLET BY MOUTH ONCE DAILY (Patient taking differently: Take 200 mg by mouth daily. ) 30 tablet 1 06/16/2019 at 0900  . apixaban (ELIQUIS) 5 MG TABS tablet Take 1 tablet (5 mg total) by mouth 2 (two) times daily. 180 tablet 2 06/16/2019 at 0900  . ASPIRIN LOW DOSE 81 MG EC tablet TAKE ONE TABLET BY MOUTH ONCE DAILY (Patient taking differently: Take 81 mg by mouth daily. ) 30 tablet 0 06/16/2019 at 0900  . [facility] VITAMIN B12 1000 MCG tablet TAKE 1 TABLET BY MOUTH EVERY DAY (Patient taking differently: Take 1,000 mcg by mouth daily. ) 90 tablet 1 06/16/2019 at 0900  . cyanocobalamin  (,VITAMIN B-12,) 1000 MCG/ML injection Inject 1,000 mcg into the muscle every 30 (thirty) days.   05/22/2019 at Unknown  . FEROSUL 325 (65 Fe) MG tablet TAKE 1 TABLET BY MOUTH EVERY DAY FOR SUPPLEMENT (Patient taking differently: Take 325 mg by mouth daily. ) 30 tablet 0 06/16/2019 at 0900  . furosemide (LASIX) 20 MG tablet TAKE ONE TABLET BY MOUTH ONCE DAILY (Patient taking differently: Take 20 mg by mouth daily. ) 30 tablet 2 06/16/2019 at 0900  . glucose blood (ONE TOUCH ULTRA TEST) test strip Use as instructed to check Blood sugar three times a day.  E11.65 Dispense patient choice. 300 each 3   . Insulin Pen Needle (PEN NEEDLES) 32G X 4 MM MISC Inject 1 Dose into the skin daily. 90 each 3   . LEVEMIR FLEXTOUCH 100 UNIT/ML Pen INJECT 15 UNITS SUB-Q EVERY MORNING (Patient taking differently: Inject 15 Units into the skin daily. ) 6 mL 1 06/16/2019 at 0900  . levothyroxine (SYNTHROID) 100 MCG tablet TAKE 1 TABLET BY MOUTH 30-60 MINUTES BEFORE BREAKFAST ON AN EMPTY STOMACH FOR THYROID REPLACEMENT (Patient taking differently: Take 100 mcg by mouth daily before breakfast. ) 90 tablet 3 06/16/2019 at 0900  . meclizine (ANTIVERT) 25 MG tablet Take 25 mg by mouth daily as needed for dizziness.   Unknown at PRN  . metFORMIN (GLUCOPHAGE) 1000 MG tablet Take 1 tablet (1,000 mg total) by mouth 2 (two) times daily with a meal. TAKE 1 TABLET TWICE DAILY  WITH  A  MEAL 180 tablet 3 06/16/2019 at 0900  . ondansetron (ZOFRAN) 4 MG tablet Take 4 mg by mouth every 8 (eight) hours as needed for nausea.   Unknown at PRN  . polyethylene glycol (MIRALAX / GLYCOLAX) packet Take 17 g by mouth daily as needed for moderate constipation.    Unknown at PRN  . potassium chloride (K-DUR) 10 MEQ tablet TAKE 1 TABLET BY MOUTH EVERY DAY FOR SUPPLEMENT (Patient taking differently: Take 10 mEq by mouth daily. ) 30 tablet 1 06/16/2019 at 0900  . simvastatin (ZOCOR) 20 MG tablet TAKE 1 TABLET BY MOUTH EACH NIGHT AT BEDTIME FOR CHOLESTEROL  (Patient taking differently: Take 20 mg by mouth at bedtime. ) 30 tablet 1 06/15/2019 at 2000  . Insulin Glargine (LANTUS SOLOSTAR) 100 UNIT/ML Solostar Pen INJECT  17 UNITS SUBCUTANEOUSLY IN THE MORNING (Patient not taking: Reported on 06/16/2019) 15 mL 2 Not Taking at Unknown time  . omeprazole (PRILOSEC) 20 MG capsule TAKE 1 CAPSULE BY MOUTH EVERY DAY IN THEMORNING 30 capsule 0    Scheduled: . amiodarone  200 mg Oral Daily  . furosemide  40 mg  Intravenous Daily  . furosemide  40 mg Intravenous Once  . insulin aspart  0-5 Units Subcutaneous QHS  . insulin aspart  0-9 Units Subcutaneous TID WC  . levothyroxine  100 mcg Oral QAC breakfast  . potassium chloride  10 mEq Oral Daily  . simvastatin  20 mg Oral QHS  . cyanocobalamin  1,000 mcg Oral Daily   Continuous: . pantoprozole (PROTONIX) infusion 8 mg/hr (06/17/19 1213)   PXT:GGYIRSWNIOEVO, meclizine, ondansetron Anti-infectives (From admission, onward)   None     Scheduled Meds: . amiodarone  200 mg Oral Daily  . furosemide  40 mg Intravenous Daily  . furosemide  40 mg Intravenous Once  . insulin aspart  0-5 Units Subcutaneous QHS  . insulin aspart  0-9 Units Subcutaneous TID WC  . levothyroxine  100 mcg Oral QAC breakfast  . potassium chloride  10 mEq Oral Daily  . simvastatin  20 mg Oral QHS  . cyanocobalamin  1,000 mcg Oral Daily   Continuous Infusions: . pantoprozole (PROTONIX) infusion 8 mg/hr (06/17/19 1213)   PRN Meds:.acetaminophen, meclizine, ondansetron   Assessment: Active Problems:   GI bleed  No further episodes of melena since yesterday Hemoglobin responded appropriately to blood transfusion Has severe B12 and iron deficiency anemia  Plan: Plan for EGD tomorrow, if negative recommend video capsule endoscopy Cardiology agreed to indefinitely stop Eliquis Continue PPI twice daily N.p.o. past midnight Parenteral iron and B12 Check intrinsic factor and parietal cell antibodies   LOS: 1 day   Rohini  Vanga 06/17/2019, 5:02 PM

## 2019-06-17 NOTE — Progress Notes (Signed)
[place] at [place] NAME: Diane Mullins    MR#:  [id number]  DATE OF BIRTH:  1933-03-26  SUBJECTIVE: Patient is seen at bedside, very hard of hearing but no complaints reported, hemoglobin up at 7.8 after 2 units of blood transfusion.  No further melena reported.  EGD tomorrow.  CHIEF COMPLAINT:   Chief Complaint  Patient presents with  . Altered Mental Status    REVIEW OF SYSTEMS:   Unable to obtain review of systems because patient is very hard of hearing, has no hearing aids, spoke with patient's daughter about that, family will get hearing aids.  DRUG ALLERGIES:   Allergies  Allergen Reactions  . Oxycodone     Other reaction(s): Confusion Patient's daughter reported confusion/sensitivity to oxycodone.     VITALS:  Blood pressure (!) 129/47, pulse 69, temperature 98.6 F (37 C), resp. rate 16, height 5\' 1"  (1.549 m), weight 62.9 kg, SpO2 93 %.  PHYSICAL EXAMINATION:  GENERAL:  85 y.o.-year-old patient lying in the bed with no acute distress.  Obese female not in distress. EYES: Pupils equal, round, reactive to light  No scleral icterus. Extraocular muscles intact.  HEENT: Head atraumatic, normocephalic. Oropharynx and nasopharynx clear.  NECK:  Supple, no jugular venous distention. No thyroid enlargement, no tenderness.  LUNGS: Normal breath sounds bilaterally, no wheezing, rales,rhonchi or crepitation. No use of accessory muscles of respiration.  CARDIOVASCULAR: S1, S2 normal. No murmurs, rubs, or gallops.  ABDOMEN: Soft, nontender, nondistended. Bowel sounds present. No organomegaly or mass.  EXTREMITIES: No pedal edema, cyanosis, or clubbing.  NEUROLOGIC: No gross neurological deficit. PSYCHIATRIC: The patient is alert and oriented x 3.  SKIN: No obvious rash, lesion, or ulcer.    LABORATORY PANEL:   CBC Recent Labs  Lab 06/17/19 0530  WBC 9.2  HGB 7.8*  HCT 24.7*  PLT 339    ------------------------------------------------------------------------------------------------------------------  Chemistries  Recent Labs  Lab 06/16/19 1055 06/17/19 0530  NA 139 141  K 4.3 3.9  CL 103 104  CO2 27 27  GLUCOSE 126* 108*  BUN 19 16  CREATININE 1.03* 1.19*  CALCIUM 8.5* 8.4*  MG  --  1.8  AST 15  --   ALT 12  --   ALKPHOS 64  --   BILITOT 0.5  --    ------------------------------------------------------------------------------------------------------------------  Cardiac Enzymes No results for input(s): TROPONINI in the last 168 hours. ------------------------------------------------------------------------------------------------------------------  RADIOLOGY:  Dg Chest 1 View  Result Date: 06/16/2019 CLINICAL DATA:  Shortness of breath EXAM: CHEST  1 VIEW COMPARISON:  12/17/2018 FINDINGS: Moderate cardiomegaly, stable. Pulmonary vascular congestion. Increased interstitial markings bilaterally. No focal airspace consolidation. No large pleural fluid collection. No pneumothorax. IMPRESSION: Findings of CHF with mild interstitial edema. Electronically Signed   By: Davina Poke M.D.   On: 06/16/2019 11:25   Ct Head Wo Contrast  Result Date: 06/16/2019 CLINICAL DATA:  Altered mental status EXAM: CT HEAD WITHOUT CONTRAST TECHNIQUE: Contiguous axial images were obtained from the base of the skull through the vertex without intravenous contrast. COMPARISON:  12/14/2018 FINDINGS: Brain: No evidence of acute infarction, hemorrhage, hydrocephalus, extra-axial collection or mass lesion/mass effect. Scattered low-density changes within the periventricular and subcortical white matter compatible with chronic microvascular ischemic change. Mild diffuse cerebral volume loss. Vascular: Mild atherosclerotic calcifications involving the large vessels of the skull base. No unexpected hyperdense vessel. Skull: Normal. Negative for fracture or focal lesion. Sinuses/Orbits: No  acute finding. Other: None. IMPRESSION: 1.  No  acute intracranial findings. 2.  Chronic microvascular ischemic change and cerebral volume loss. Electronically Signed   By: Davina Poke M.D.   On: 06/16/2019 11:32    EKG:   Orders placed or performed during the hospital encounter of 06/16/19  . EKG 12-Lead  . EKG 12-Lead    ASSESSMENT AND PLAN:   85 year old female with history of chronic A. fib on Eliquis, aspirin 81 mg, coming from [facility] because of, severe anemia.  Hemoglobin was 5.1 on admission.  Most recent hemoglobin was 10.2 in July.  Patient is admitted for the above.   #1/melena likely upper GI bleed differential diagnosis include peptic ulcer disease/Cameron lesion.  Continue Protonix drip, gastroenterology recommends EGD tomorrow.  Spoke with patient's family.  Continue to hold Eliquis, aspirin.  Had EGD, colonoscopy in March of this year. #2 acute on chronic anemia, acute blood loss anemia due to GI bleeding, hemoglobin improved from 5.1-7.8 after 2 units of blood transfusion. 3.  Chronic atrial fibrillation, rate controlled, EF is more than 60%, continue amiodarone, monitor on telemetry.  Continue to hold Eliquis, aspirin secondary to GI bleed. 4.  Colon polyp, patient needs to have surgery as an outpatient regarding polypectomy  #5 chronic diastolic heart failure, stable.  Continue Lasix but switch to orals.  Patient started on clear liquids.  Patient home dose Lasix will be continued. 6.  Hyperlipidemia, hypertension stable 7.  Hypothyroidism: Continue Synthyroid. 8.  Diabetes mellitus type 2: Continue sliding scale insulin with coverage. .  Hold Levemir at this time secondary to not eating well ,only on liquids.  Spoke with patient's daughter [name] at [phone] All the records are reviewed and case discussed with Care Management/Social Workerr. Management plans discussed with the patient, family and they are in agreement.  CODE STATUS:  DNR  TOTAL TIME TAKING CARE OF THIS PATIENT: 40.  Minutes.   POSSIBLE D/C IN 1-2 DAYS, DEPENDING ON CLINICAL CONDITION. More than 50% time spent in counseling, coordination of care  Epifanio Lesches M.D on 06/17/2019 at 12:51 PM  Between 7am to 6pm - Pager - [phone]  After 6pm go to [email or web address] - password [email or web address] [facility]  [facility]  Office  [phone]  CC: Primary care physician; [name], MD   Note: This dictation was prepared with Dragon dictation along with smaller phrase technology. Any transcriptional errors that result from this process are unintentional.

## 2019-06-18 ENCOUNTER — Telehealth: Payer: Self-pay | Admitting: Cardiovascular Disease

## 2019-06-18 ENCOUNTER — Encounter
Admission: EM | Disposition: A | Discharge status: Home Health | Payer: Self-pay | Source: Home / Self Care | Attending: Internal Medicine

## 2019-06-18 ENCOUNTER — Telehealth: Payer: Self-pay | Admitting: *Deleted

## 2019-06-18 ENCOUNTER — Inpatient Hospital Stay: Payer: PPO | Admitting: Anesthesiology

## 2019-06-18 HISTORY — PX: ESOPHAGOGASTRODUODENOSCOPY: SHX5428

## 2019-06-18 HISTORY — PX: GIVENS CAPSULE STUDY: SHX5432

## 2019-06-18 LAB — ANTI-PARIETAL ANTIBODY: Parietal Cell Antibody-IgG: 2.4 Units (ref 0.0–20.0)

## 2019-06-18 LAB — GLUCOSE, CAPILLARY
Glucose-Capillary: 116 mg/dL — ABNORMAL HIGH (ref 70–99)
Glucose-Capillary: 109 mg/dL — ABNORMAL HIGH (ref 70–99)
Glucose-Capillary: 115 mg/dL — ABNORMAL HIGH (ref 70–99)
Glucose-Capillary: 229 mg/dL — ABNORMAL HIGH (ref 70–99)
Glucose-Capillary: 118 mg/dL — ABNORMAL HIGH (ref 70–99)

## 2019-06-18 LAB — INTRINSIC FACTOR ANTIBODIES: Intrinsic Factor: 1.1 AU/mL (ref 0.0–1.1)

## 2019-06-18 LAB — T3: T3, Total: 62 ng/dL — ABNORMAL LOW (ref 71–180)

## 2019-06-18 SURGERY — IMAGING PROCEDURE, GI TRACT, INTRALUMINAL, VIA CAPSULE
Anesthesia: General

## 2019-06-18 SURGERY — EGD (ESOPHAGOGASTRODUODENOSCOPY)
Anesthesia: General

## 2019-06-18 MED ORDER — PROPOFOL 500 MG/50ML IV EMUL
INTRAVENOUS | Status: AC
  Filled 2019-06-18: qty 50

## 2019-06-18 MED ORDER — PROPOFOL 10 MG/ML IV BOLUS
INTRAVENOUS | Status: DC | PRN
  Administered 2019-06-18: 30 mg via INTRAVENOUS

## 2019-06-18 MED ORDER — LIDOCAINE HCL (CARDIAC) PF 100 MG/5ML IV SOSY
PREFILLED_SYRINGE | INTRAVENOUS | Status: DC | PRN
  Administered 2019-06-18: 60 mg via INTRAVENOUS

## 2019-06-18 MED ORDER — SODIUM CHLORIDE 0.9 % IV SOLN
INTRAVENOUS | Status: DC
  Administered 2019-06-18: 12:00:00 via INTRAVENOUS

## 2019-06-18 MED ORDER — LIDOCAINE HCL (PF) 2 % IJ SOLN
INTRAMUSCULAR | Status: AC
  Filled 2019-06-18: qty 10

## 2019-06-18 MED ORDER — PROPOFOL 500 MG/50ML IV EMUL
INTRAVENOUS | Status: DC | PRN
  Administered 2019-06-18: 160 ug/kg/min via INTRAVENOUS

## 2019-06-18 NOTE — Telephone Encounter (Signed)
To Dr. Gollan to review.  

## 2019-06-18 NOTE — Telephone Encounter (Signed)
I sent a message to the team in the hospital already to hold the Eliquis

## 2019-06-18 NOTE — Progress Notes (Signed)
[place] at [place] NAME: Diane Mullins    MR#:  [id number]  DATE OF BIRTH:  [birth year]  SUBJECTIVE: EGD, colonoscopy this morning, she is more coherent she has a hearing aid and able to hear better today, says that she was here recently in March and has again anemia wants to know why she is bleeding.  CHIEF COMPLAINT:   Chief Complaint  Patient presents with  . Altered Mental Status    REVIEW OF SYSTEMS:   Alert, awake, oriented Cardiovascular no chest pain Pulmonary no shortness of breath Abdomen no abdominal pain, no nausea no Vomiting. Neurological ; no history of seizures, strokes.  DRUG ALLERGIES:   Allergies  Allergen Reactions  . Oxycodone     Other reaction(s): Confusion Patient's daughter reported confusion/sensitivity to oxycodone.     VITALS:  Blood pressure 123/64, pulse 66, temperature (!) 97.2 F (36.2 C), temperature source Tympanic, resp. rate 18, height 5\' 1"  (1.549 m), weight 62.9 kg, SpO2 95 %.  PHYSICAL EXAMINATION:  GENERAL:  85 y.o.-year-old patient lying in the bed with no acute distress.  Obese female not in distress. EYES: Pupils equal, round, reactive to light  No scleral icterus. Extraocular muscles intact.  HEENT: Head atraumatic, normocephalic. Oropharynx and nasopharynx clear.  NECK:  Supple, no jugular venous distention. No thyroid enlargement, no tenderness.  LUNGS: Normal breath sounds bilaterally, no wheezing, rales,rhonchi or crepitation. No use of accessory muscles of respiration.  CARDIOVASCULAR: S1, S2 normal. No murmurs, rubs, or gallops.  ABDOMEN: Soft, nontender, nondistended. Bowel sounds present. No organomegaly or mass.  EXTREMITIES: No pedal edema, cyanosis, or clubbing.  NEUROLOGIC: No gross neurological deficit. PSYCHIATRIC: The patient is alert and oriented x 3.  SKIN: No obvious rash, lesion, or ulcer.    LABORATORY PANEL:   CBC Recent Labs  Lab 06/17/19 0530  WBC  9.2  HGB 7.8*  HCT 24.7*  PLT 339   ------------------------------------------------------------------------------------------------------------------  Chemistries  Recent Labs  Lab 06/16/19 1055 06/17/19 0530  NA 139 141  K 4.3 3.9  CL 103 104  CO2 27 27  GLUCOSE 126* 108*  BUN 19 16  CREATININE 1.03* 1.19*  CALCIUM 8.5* 8.4*  MG  --  1.8  AST 15  --   ALT 12  --   ALKPHOS 64  --   BILITOT 0.5  --    ------------------------------------------------------------------------------------------------------------------  Cardiac Enzymes No results for input(s): TROPONINI in the last 168 hours. ------------------------------------------------------------------------------------------------------------------  RADIOLOGY:  No results found.  EKG:   Orders placed or performed during the hospital encounter of 06/16/19  . EKG 12-Lead  . EKG 12-Lead    ASSESSMENT AND PLAN:   85 year old female with history of chronic A. fib on Eliquis, aspirin 81 mg, coming from [facility] care home because of, severe anemia.  Hemoglobin was 5.1 on admission.  Most recent hemoglobin was 10.2 in July.  Patient is admitted for the above.   #1/melena likely upper GI bleed differential diagnosis include peptic ulcer disease/Cameron lesion.  Continue Protonix drip, g for EGD today.   #2 acute on chronic anemia, acute blood loss anemia due to GI bleeding, hemoglobin improved from 5.1-7.8 after 2 units of blood transfusion. 3.  Chronic atrial fibrillation, rate controlled, EF is more than 60%, continue amiodarone, monitor on telemetry.  Continue to hold Eliquis, aspirin secondary to GI bleed.  Epic text messaged Dr. Rockey Situ.  Due to recurrent GI bleed he agrees to hold Eliquis for now.Marland Kitchen 4.  Colon polyp, patient needs to have surgery as an outpatient regarding polypectomy  #5 chronic diastolic heart failure, stable.  Continue Lasix but switch to orals. 6.  Hyperlipidemia, hypertension stable 7.   Hypothyroidism: Continue Synthyroid. 8.  Diabetes mellitus type 2: Continue sliding scale insulin with coverage. .  Hold Levemir at this time secondary to not eating well ,only on liquids.  Spoke with patient's daughter Diane Mullins at [phone] yesterday. All the records are reviewed and case discussed with Care Management/Social Workerr. Management plans discussed with the patient, family and they are in agreement.  CODE STATUS: DNR  TOTAL TIME TAKING CARE OF THIS PATIENT: 73.  Minutes.   POSSIBLE D/C IN 1-2 DAYS, DEPENDING ON CLINICAL CONDITION. More than 50% time spent in counseling, coordination of care  Epifanio Lesches M.D on 06/18/2019 at 12:21 PM  Between 7am to 6pm - Pager - [phone]  After 6pm go to [email or web address] - password [email or web address] [facility]  [facility]  Office  [phone]  CC: Primary care physician; Diane Haven, MD   Note: This dictation was prepared with Dragon dictation along with smaller phrase technology. Any transcriptional errors that result from this process are unintentional.

## 2019-06-18 NOTE — Transfer of Care (Signed)
Immediate Anesthesia Transfer of Care Note  Patient: Diane Mullins  Procedure(s) Performed: ESOPHAGOGASTRODUODENOSCOPY (EGD) (N/A )  Patient Location: PACU  Anesthesia Type:General  Level of Consciousness: sedated  Airway & Oxygen Therapy: Patient Spontanous Breathing and Patient connected to face mask oxygen  Post-op Assessment: Report given to RN and Post -op Vital signs reviewed and stable  Post vital signs: Reviewed and stable  Last Vitals:  Vitals Value Taken Time  BP 123/64 06/18/19 1212  Temp 36.2 C 06/18/19 1212  Pulse 70 06/18/19 1214  Resp 18 06/18/19 1214  SpO2 93 % 06/18/19 1214  Vitals shown include unvalidated device data.  Last Pain:  Vitals:   06/18/19 1212  TempSrc: Tympanic  PainSc: Asleep         Complications: No apparent anesthesia complications

## 2019-06-18 NOTE — Progress Notes (Signed)
Removed capsul study camera and placed in belonging bag at the front desk as instructed. Dorna Bloom RN

## 2019-06-18 NOTE — Anesthesia Preprocedure Evaluation (Signed)
Anesthesia Evaluation  Patient identified by MRN, date of birth, ID band Patient awake    Reviewed: Allergy & Precautions, NPO status , Patient's Chart, lab work & pertinent test results  History of Anesthesia Complications Negative for: history of anesthetic complications  Airway Mallampati: II  TM Distance: >3 FB Neck ROM: Full    Dental  (+) Poor Dentition   Pulmonary neg pulmonary ROS, neg sleep apnea, neg COPD,    breath sounds clear to auscultation- rhonchi (-) wheezing      Cardiovascular hypertension, + CAD and +CHF  (-) Past MI, (-) Cardiac Stents and (-) CABG + dysrhythmias Atrial Fibrillation  Rhythm:Regular Rate:Normal - Systolic murmurs and - Diastolic murmurs    Neuro/Psych PSYCHIATRIC DISORDERS Anxiety TIA   GI/Hepatic negative GI ROS, Neg liver ROS,   Endo/Other  diabetes, Insulin DependentHypothyroidism   Renal/GU Renal InsufficiencyRenal disease     Musculoskeletal negative musculoskeletal ROS (+)   Abdominal (+) - obese,   Peds  Hematology negative hematology ROS (+) anemia ,   Anesthesia Other Findings Past Medical History: No date: Atrial fibrillation ([place]) 07/17/2014: Benign paroxysmal positional vertigo No date: Chronic diastolic CHF (congestive heart failure) (HCC)     Comment:  a. echo 10/19: EF of 60-65%, no RWMA, Gr2DD, calcified               mitral annulus with mild MR, mildly to moderately dilated              left atrium, moderate TR, PASP 52 mmHg No date: Diabetes mellitus No date: H/O: rheumatic fever No date: Hyperlipidemia No date: Hypertension No date: Pulmonary hypertension (HCC) No date: Stroke [facility])     Comment:  TIA Jan. 1st No date: Teratoma of left ovary No date: Thyroid disease   Reproductive/Obstetrics                             Anesthesia Physical Anesthesia Plan  ASA: III  Anesthesia Plan: General   Post-op Pain Management:     Induction: Intravenous  PONV Risk Score and Plan: 2 and Propofol infusion  Airway Management Planned: Natural Airway  Additional Equipment:   Intra-op Plan:   Post-operative Plan:   Informed Consent: I have reviewed the patients History and Physical, chart, labs and discussed the procedure including the risks, benefits and alternatives for the proposed anesthesia with the patient or authorized representative who has indicated his/her understanding and acceptance.     Dental advisory given  Plan Discussed with: CRNA and Anesthesiologist  Anesthesia Plan Comments:         Anesthesia Quick Evaluation

## 2019-06-18 NOTE — Telephone Encounter (Signed)
Copied from [place] [id number]. Topic: General - Inquiry >> Jun 18, 2019  8:31 AM Scherrie Gerlach wrote: Reason for CRM: daughter [name] calling to let Dr Josephina Gip know pt is back in the hospital and due to being put back on (ELIQUIS) 5 MG TABS tablet.  Pt had told family Dr Josephina Gip had prescribed for her.  But it was Dr Alda Lea. She was going to ask dr what else pt can be prescribed, because this med put her in the hospital last time.   Advised her she should call heartcare to inform that dr.  She stills asked that the dr or his asst call her back.

## 2019-06-18 NOTE — Progress Notes (Signed)
This morning endoscopy called and said it was okay to go ahead and give patient her amiodarone but hold the rest of her medications until she comes back to floor after procedure.

## 2019-06-18 NOTE — Anesthesia Post-op Follow-up Note (Signed)
Anesthesia QCDR form completed.        

## 2019-06-18 NOTE — Telephone Encounter (Signed)
He will probably be up to GI whether she can ever go back on a blood thinner Others will do the same including Xarelto warfarin Given 2 bleeds, it may not be worth the risk unless GI can provide a permanent fix which is typically very unlikely

## 2019-06-18 NOTE — Op Note (Signed)
[facility] Gastroenterology Patient Name: Diane Mullins Procedure Date: 06/18/2019 11:49 AM MRN: [id number] Account #: [id number] Date of Birth: July 28, 1933 Admit Type: Outpatient Age: 85 Room: [facility] ENDO ROOM 4 Gender: Female Note Status: Finalized Procedure:            Upper GI endoscopy Indications:          Acute post hemorrhagic anemia, Melena Providers:            Lin Landsman MD, MD Medicines:            Monitored Anesthesia Care Complications:        No immediate complications. Estimated blood loss: None. Procedure:            Pre-Anesthesia Assessment:                       - Prior to the procedure, a History and Physical was                        performed, and patient medications and allergies were                        reviewed. The patient is competent. The risks and                        benefits of the procedure and the sedation options and                        risks were discussed with the patient. All questions                        were answered and informed consent was obtained.                        Patient identification and proposed procedure were                        verified by the physician, the nurse, the                        anesthesiologist, the anesthetist and the technician in                        the pre-procedure area in the procedure room in the                        endoscopy suite. Mental Status Examination: alert and                        oriented. Airway Examination: normal oropharyngeal                        airway and neck mobility. Respiratory Examination:                        clear to auscultation. CV Examination: normal.                        Prophylactic Antibiotics: The patient does not require  prophylactic antibiotics. Prior Anticoagulants: The                        patient has taken Eliquis (apixaban), last dose was 2                        days prior to procedure. ASA Grade  Assessment: III - A                        patient with severe systemic disease. After reviewing                        the risks and benefits, the patient was deemed in                        satisfactory condition to undergo the procedure. The                        anesthesia plan was to use monitored anesthesia care                        (MAC). Immediately prior to administration of                        medications, the patient was re-assessed for adequacy                        to receive sedatives. The heart rate, respiratory rate,                        oxygen saturations, blood pressure, adequacy of                        pulmonary ventilation, and response to care were                        monitored throughout the procedure. The physical status                        of the patient was re-assessed after the procedure.                       After obtaining informed consent, the endoscope was                        passed under direct vision. Throughout the procedure,                        the patient's blood pressure, pulse, and oxygen                        saturations were monitored continuously. The Endoscope                        was introduced through the mouth, and advanced to the                        second part of duodenum. The upper GI endoscopy was  accomplished without difficulty. The patient tolerated                        the procedure well. Findings:      The duodenal bulb and second portion of the duodenum were normal.      A 3 cm hiatal hernia was present.      The entire examined stomach was normal.      The cardia and gastric fundus were normal on retroflexion.      The gastroesophageal junction and examined esophagus were normal. Impression:           - Normal duodenal bulb and second portion of the                        duodenum.                       - 3 cm hiatal hernia.                       - Normal stomach.                        - Normal gastroesophageal junction and esophagus.                       - No specimens collected. Recommendation:       - Return patient to hospital ward for ongoing care.                       - To visualize the small bowel, perform video capsule                        endoscopy today. Procedure Code(s):    --- Professional ---                       [id number], Esophagogastroduodenoscopy, flexible, transoral;                        diagnostic, including collection of specimen(s) by                        brushing or washing, when performed (separate procedure) Diagnosis Code(s):    --- Professional ---                       K44.9, Diaphragmatic hernia without obstruction or                        gangrene                       D62, Acute posthemorrhagic anemia                       K92.1, Melena (includes Hematochezia) CPT copyright 2019 [employer]. All rights reserved. The codes documented in this report are preliminary and upon coder review may  be revised to meet current compliance requirements. Dr. Ulyess Mort Lin Landsman MD, MD 06/18/2019 12:09:10 PM This report has been signed electronically. Number of Addenda: 0 Note Initiated On: 06/18/2019 11:49 AM Estimated Blood Loss: Estimated blood loss: none.      [facility]

## 2019-06-18 NOTE — Telephone Encounter (Signed)
I spoke with Diane Mullins (ok per DPR). I have advised her of Dr. Donivan Scull message back to nursing.  She states that this was the exact same thing that happened to her 7-8 months ago. GI could not find a source for her bleeding. They did not see her outpatient.  The family is concerned after discharge what the plan may be with the patient going back on blood thinner. They feel like she would need to be on something different. Per Diane Mullins, she thinks eliquis is the only anticoagulant the patient has been on.  I have advised it is a fair question to see if an alternative blood thinner would be appropriate. I am unsure if GI will follow her outpatient.  Will forward to Dr. Rockey Situ to see what the plan would be post discharge. Who would she need to see- cardiology/ PCP and when? She has an appointment with her PCP on 10/21.

## 2019-06-18 NOTE — Telephone Encounter (Signed)
Pt c/o medication issue:  1. Name of Medication: Eliquis  2. How are you currently taking this medication (dosage and times per day)?  unknown  3. Are you having a reaction (difficulty breathing--STAT)? Currently admitted [facility] for GI bleed and hgb 5 per daughter   28. What is your medication issue? This happened the last time patient was on eliquis.  Patient was restarted in august and family wants to discuss stopping or changing to a new med

## 2019-06-19 DIAGNOSIS — K633 Ulcer of intestine: Secondary | ICD-10-CM

## 2019-06-19 LAB — CBC
HCT: 31.3 % — ABNORMAL LOW (ref 36.0–46.0)
Hemoglobin: 9.8 g/dL — ABNORMAL LOW (ref 12.0–15.0)
MCH: 28.6 pg (ref 26.0–34.0)
MCHC: 31.3 g/dL (ref 30.0–36.0)
MCV: 91.3 fL (ref 80.0–100.0)
Platelets: 407 10*3/uL — ABNORMAL HIGH (ref 150–400)
RBC: 3.43 MIL/uL — ABNORMAL LOW (ref 3.87–5.11)
RDW: 17.5 % — ABNORMAL HIGH (ref 11.5–15.5)
WBC: 7 10*3/uL (ref 4.0–10.5)
nRBC: 0.7 % — ABNORMAL HIGH (ref 0.0–0.2)

## 2019-06-19 LAB — GLUCOSE, CAPILLARY
Glucose-Capillary: 119 mg/dL — ABNORMAL HIGH (ref 70–99)
Glucose-Capillary: 172 mg/dL — ABNORMAL HIGH (ref 70–99)

## 2019-06-19 MED ORDER — PANTOPRAZOLE SODIUM 40 MG PO TBEC: 40.0000 mg | DELAYED_RELEASE_TABLET | Freq: Every day | ORAL | 0 refills | Status: DC

## 2019-06-19 MED ORDER — LEVEMIR FLEXTOUCH 100 UNIT/ML ~~LOC~~ SOPN: 10.0000 [IU] | PEN_INJECTOR | Freq: Every day | SUBCUTANEOUS | 0 refills | Status: DC

## 2019-06-19 MED ORDER — CARVEDILOL 3.125 MG PO TABS: 3.1250 mg | ORAL_TABLET | Freq: Two times a day (BID) | ORAL | 11 refills | Status: DC

## 2019-06-19 MED ORDER — FUROSEMIDE 40 MG PO TABS: 40.0000 mg | ORAL_TABLET | Freq: Every day | ORAL | 0 refills | Status: DC

## 2019-06-19 MED ORDER — SODIUM CHLORIDE 0.9 % IV SOLN
300.0000 mg | Freq: Once | INTRAVENOUS | Status: AC
  Administered 2019-06-19: 14:00:00 300 mg via INTRAVENOUS
  Filled 2019-06-19: qty 15

## 2019-06-19 NOTE — Telephone Encounter (Signed)
daughter Diane Mullins calling to let Dr Josephina Gip know pt is back in the hospital and due to being put back on (ELIQUIS) 5 MG TABS tablet.  Pt had told family Dr Josephina Gip had prescribed for her.  But it was Dr Alda Lea. She was going to ask dr what else pt can be prescribed, because this med put her in the hospital last time.   Advised her she should call heartcare to inform that dr.  She stills asked that the dr or his asst call her back.  Diane Mullins,cma

## 2019-06-19 NOTE — Care Management Important Message (Signed)
Important Message  Patient Details  Name: Diane Mullins MRN: [id number] Date of Birth: [birth year]   Medicare Important Message Given:  Yes     Juliann Pulse A Darius Lundberg 06/19/2019, 11:50 AM

## 2019-06-19 NOTE — Discharge Summary (Signed)
[facility] NAME: Diane Mullins    MR#:  [id number]  DATE OF BIRTH:  [birth year]  DATE OF ADMISSION:  06/16/2019   ADMITTING PHYSICIAN: Otila Back, MD  DATE OF DISCHARGE: 06/19/2019  PRIMARY CARE PHYSICIAN: [name], MD   ADMISSION DIAGNOSIS:  [place] [K92.1] Anemia, unspecified type [D64.9] DISCHARGE DIAGNOSIS:  Active Problems:   GI bleed  SECONDARY DIAGNOSIS:   Past Medical History:  Diagnosis Date  . Atrial fibrillation ([place])   . Benign paroxysmal positional vertigo 07/17/2014  . Chronic diastolic CHF (congestive heart failure) ([place])    a. echo 10/19: EF of 60-65%, no RWMA, Gr2DD, calcified mitral annulus with mild MR, mildly to moderately dilated left atrium, moderate TR, PASP 52 mmHg  . Diabetes mellitus   . H/O: rheumatic fever   . Hyperlipidemia   . Hypertension   . Pulmonary hypertension ([place])   . Stroke [facility])    TIA Jan. 1st  . Teratoma of left ovary   . Thyroid disease    HOSPITAL COURSE:  Chief complaint; generalized weakness.  Altered mental status  History of presenting complaint; Diane Mullins  is a 85 y.o. female with a known history of chronic diastolic CHF, diabetes mellitus, hypertension, hyperlipidemia, chronic atrial fibrillation on anticoagulation with Eliquis as well as chronic dysarthria which daughter reports has been worked up in the past with no acute findings there was brought into the emergency room today with complaints of altered mental status and generalized weakness. Patient evaluated in the emergency room and found to have hemoglobin of 5.1.  Noted to have melanotic stools.  CT scan of the head without contrast done with no acute findings.  Patient diagnosed with acute blood loss anemia secondary to GI bleed.  Patient awake and alert and oriented at the time of my evaluation apart from her chronic dysarthria.  Admitted to medical service for further evaluation.   Hospital course;  #1.  GI bleed Patient seen by gastroenterologist and had EGD done with no acute findings.  Patient transfused with 2 units of packed red blood cells with improvement in hemoglobin from 5.1 on admission to 9.8 today prior to discharge.  Patient subsequently had capsule endoscopy done during this admission which revealed a 3 mm clean-based ulcer in the proximal to mid small bowel.  Rest of the small bowel appeared normal on the limited exam.  This was felt to be the source of the bleeding.  Patient was also seen by cardiologist Dr. Rockey Situ.  Decision was to hold off on anticoagulation with aspirin and Eliquis at this time.  Okay with cardiologist.  Patient to continue with Protonix 40 mg p.o. daily.  Was given IV Venofer prior to discharge.  Continue vitamin B12 replacement as well as iron replacement on discharge as outpatient.  Follow-up with primary care physician to recheck labs within 1 week.  Follow-up with gastroenterologist in 4 weeks as recommended. #2 acute on chronic anemia, acute blood loss anemia due to GI bleeding, hemoglobin improved from 5.1-9.8 after 2 units of blood transfusion. 3.  Chronic atrial fibrillation, rate controlled, EF is more than 60%, continue amiodarone, monitor on telemetry.    Anticoagulation with Eliquis discontinued due to GI bleed.  Okay with cardiology. 4.  Colon polyp, patient needs to have surgery as an outpatient regarding polypectomy 5. acute on chronic chronic diastolic heart failure On admission.  Stable at this time.  Discharge on p.o. Lasix.  Added low-dose Coreg.  2D echocardiogram done during this admission with normal ejection fraction.  6.  Hyperlipidemia, hypertension stable 7.  Hypothyroidism: Continue Synthyroid. 8.  Diabetes mellitus type 2: Resume insulin at a lower dose to prevent hypoglycemia.  Outpatient monitoring by primary care physician  Disposition; patient clinically and hemodynamically stable.  Cleared by gastroenterologist Dr. Marius Ditch who has  already discussed and updated patient's daughter today on treatment plans and discharge plans for today.  Follow-up on physical therapy recommendation prior to discharge. DISCHARGE CONDITIONS:  Stable CONSULTS OBTAINED:  Treatment Team:  Lin Landsman, MD DRUG ALLERGIES:   Allergies  Allergen Reactions  . Oxycodone     Other reaction(s): Confusion Patient's daughter reported confusion/sensitivity to oxycodone.    DISCHARGE MEDICATIONS:   Allergies as of 06/19/2019      Reactions   Oxycodone    Other reaction(s): Confusion Patient's daughter reported confusion/sensitivity to oxycodone.      Medication List    STOP taking these medications   apixaban 5 MG Tabs tablet Commonly known as: Eliquis   Aspirin Low Dose 81 MG EC tablet Generic drug: aspirin   Insulin Glargine 100 UNIT/ML Solostar Pen Commonly known as: Lantus SoloStar   omeprazole 20 MG capsule Commonly known as: PRILOSEC     TAKE these medications   acetaminophen 325 MG tablet Commonly known as: TYLENOL Take 650 mg by mouth every 6 (six) hours as needed for mild pain or moderate pain.   amiodarone 200 MG tablet Commonly known as: PACERONE TAKE ONE TABLET BY MOUTH ONCE DAILY   carvedilol 3.125 MG tablet Commonly known as: Coreg Take 1 tablet (3.125 mg total) by mouth 2 (two) times daily. Hold for heart rate less than 60   cyanocobalamin 1000 MCG/ML injection Commonly known as: (VITAMIN B-12) Inject 1,000 mcg into the muscle every 30 (thirty) days. What changed: Another medication with the same name was changed. Make sure you understand how and when to take each.   [employer] VITAMIN B12 1000 MCG tablet Generic drug: cyanocobalamin TAKE 1 TABLET BY MOUTH EVERY DAY What changed: how much to take   FeroSul 325 (65 FE) MG tablet Generic drug: ferrous sulfate TAKE 1 TABLET BY MOUTH EVERY DAY FOR SUPPLEMENT What changed: See the new instructions.   furosemide 40 MG tablet Commonly known as: Lasix  Take 1 tablet (40 mg total) by mouth daily. What changed:   medication strength  how much to take   glucose blood test strip Commonly known as: ONE TOUCH ULTRA TEST Use as instructed to check Blood sugar three times a day.  E11.65 Dispense patient choice.   Levemir FlexTouch 100 UNIT/ML Pen Generic drug: Insulin Detemir Inject 10 Units into the skin daily. What changed: See the new instructions.   levothyroxine 100 MCG tablet Commonly known as: SYNTHROID TAKE 1 TABLET BY MOUTH 30-60 MINUTES BEFORE BREAKFAST ON AN EMPTY STOMACH FOR THYROID REPLACEMENT What changed:   how much to take  how to take this  when to take this  additional instructions   meclizine 25 MG tablet Commonly known as: ANTIVERT Take 25 mg by mouth daily as needed for dizziness.   metFORMIN 1000 MG tablet Commonly known as: GLUCOPHAGE Take 1 tablet (1,000 mg total) by mouth 2 (two) times daily with a meal. TAKE 1 TABLET TWICE DAILY  WITH  A  MEAL   ondansetron 4 MG tablet Commonly known as: ZOFRAN Take 4 mg by mouth every 8 (eight) hours as needed for nausea.   pantoprazole 40 MG  tablet Commonly known as: Protonix Take 1 tablet (40 mg total) by mouth daily.   Pen Needles 32G X 4 MM Misc Inject 1 Dose into the skin daily.   polyethylene glycol 17 g packet Commonly known as: MIRALAX / GLYCOLAX Take 17 g by mouth daily as needed for moderate constipation.   potassium chloride 10 MEQ tablet Commonly known as: KLOR-CON TAKE 1 TABLET BY MOUTH EVERY DAY FOR SUPPLEMENT What changed: See the new instructions.   simvastatin 20 MG tablet Commonly known as: ZOCOR TAKE 1 TABLET BY MOUTH EACH NIGHT AT BEDTIME FOR CHOLESTEROL What changed: See the new instructions.        DISCHARGE INSTRUCTIONS:   DIET:  Diabetic diet DISCHARGE CONDITION:  Stable ACTIVITY:  Activity as tolerated OXYGEN:  Home Oxygen: No.  Oxygen Delivery: room air DISCHARGE LOCATION:  Nursing facility  If you  experience worsening of your admission symptoms, develop shortness of breath, life threatening emergency, suicidal or homicidal thoughts you must seek medical attention immediately by calling 911 or calling your MD immediately  if symptoms less severe.  You Must read complete instructions/literature along with all the possible adverse reactions/side effects for all the Medicines you take and that have been prescribed to you. Take any new Medicines after you have completely understood and accpet all the possible adverse reactions/side effects.   Please note  You were cared for by a hospitalist during your hospital stay. If you have any questions about your discharge medications or the care you received while you were in the hospital after you are discharged, you can call the unit and asked to speak with the hospitalist on call if the hospitalist that took care of you is not available. Once you are discharged, your primary care physician will handle any further medical issues. Please note that NO REFILLS for any discharge medications will be authorized once you are discharged, as it is imperative that you return to your primary care physician (or establish a relationship with a primary care physician if you do not have one) for your aftercare needs so that they can reassess your need for medications and monitor your lab values.    On the day of Discharge:  VITAL SIGNS:  Blood pressure 140/63, pulse 75, temperature 98.1 F (36.7 C), temperature source Oral, resp. rate 16, height 5\' 1"  (1.549 m), weight 62.9 kg, SpO2 93 %. PHYSICAL EXAMINATION:  GENERAL:  85 y.o.-year-old patient lying in the bed with no acute distress.  EYES: Pupils equal, round, reactive to light and accommodation. No scleral icterus. Extraocular muscles intact.  HEENT: Head atraumatic, normocephalic. Oropharynx and nasopharynx clear.  NECK:  Supple, no jugular venous distention. No thyroid enlargement, no tenderness.  LUNGS: Normal  breath sounds bilaterally, no wheezing, rales,rhonchi or crepitation. No use of accessory muscles of respiration.  CARDIOVASCULAR: S1, S2 normal. No murmurs, rubs, or gallops.  ABDOMEN: Soft, non-tender, non-distended. Bowel sounds present. No organomegaly or mass.  EXTREMITIES: No pedal edema, cyanosis, or clubbing.  NEUROLOGIC: Cranial nerves II through XII are intact. Muscle strength 5/5 in all extremities. Sensation intact. Gait not checked.  PSYCHIATRIC: The patient is alert and oriented x 3.  SKIN: No obvious rash, lesion, or ulcer.  DATA REVIEW:   CBC Recent Labs  Lab 06/19/19 0832  WBC 7.0  HGB 9.8*  HCT 31.3*  PLT 407*    Chemistries  Recent Labs  Lab 06/16/19 1055 06/17/19 0530  NA 139 141  K 4.3 3.9  CL 103 104  CO2 27 27  GLUCOSE 126* 108*  BUN 19 16  CREATININE 1.03* 1.19*  CALCIUM 8.5* 8.4*  MG  --  1.8  AST 15  --   ALT 12  --   ALKPHOS 64  --   BILITOT 0.5  --      Microbiology Results  Results for orders placed or performed during the hospital encounter of 06/16/19  SARS Coronavirus 2 [facility] order, Performed in [facility] hospital lab) Nasopharyngeal Nasopharyngeal Swab     Status: None   Collection Time: 06/16/19 10:55 AM   Specimen: Nasopharyngeal Swab  Result Value Ref Range Status   SARS Coronavirus 2 NEGATIVE NEGATIVE Final    Comment: (NOTE) If result is NEGATIVE SARS-CoV-2 target nucleic acids are NOT DETECTED. The SARS-CoV-2 RNA is generally detectable in upper and lower  respiratory specimens during the acute phase of infection. The lowest  concentration of SARS-CoV-2 viral copies this assay can detect is 250  copies / mL. A negative result does not preclude SARS-CoV-2 infection  and should not be used as the sole basis for treatment or other  patient management decisions.  A negative result may occur with  improper specimen collection / handling, submission of specimen other  than nasopharyngeal swab, presence of viral  mutation(s) within the  areas targeted by this assay, and inadequate number of viral copies  (<250 copies / mL). A negative result must be combined with clinical  observations, patient history, and epidemiological information. If result is POSITIVE SARS-CoV-2 target nucleic acids are DETECTED. The SARS-CoV-2 RNA is generally detectable in upper and lower  respiratory specimens dur ing the acute phase of infection.  Positive  results are indicative of active infection with SARS-CoV-2.  Clinical  correlation with patient history and other diagnostic information is  necessary to determine patient infection status.  Positive results do  not rule out bacterial infection or co-infection with other viruses. If result is PRESUMPTIVE POSTIVE SARS-CoV-2 nucleic acids MAY BE PRESENT.   A presumptive positive result was obtained on the submitted specimen  and confirmed on repeat testing.  While 2019 novel coronavirus  (SARS-CoV-2) nucleic acids may be present in the submitted sample  additional confirmatory testing may be necessary for epidemiological  and / or clinical management purposes  to differentiate between  SARS-CoV-2 and other Sarbecovirus currently known to infect humans.  If clinically indicated additional testing with an alternate test  methodology [phone]) is advised. The SARS-CoV-2 RNA is generally  detectable in upper and lower respiratory sp ecimens during the acute  phase of infection. The expected result is Negative. Fact Sheet for Patients:  StrictlyIdeas.no Fact Sheet for Healthcare Providers: BankingDealers.co.za This test is not yet approved or cleared by the [place] FDA and has been authorized for detection and/or diagnosis of SARS-CoV-2 by FDA under an Emergency Use Authorization (EUA).  This EUA will remain in effect (meaning this test can be used) for the duration of the COVID-19 declaration under Section 564(b)(1)  of the Act, 21 U.S.C. section 360bbb-3(b)(1), unless the authorization is terminated or revoked sooner. Performed at [facility], [address], [place], [place] [address]   Urine culture     Status: None   Collection Time: 06/16/19 12:53 PM   Specimen: Urine, Clean Catch  Result Value Ref Range Status   Specimen Description   Final    URINE, CLEAN CATCH Performed at [facility], [address] [place], [place] [address]    Special Requests  Final    NONE Performed at [facility], [address], [place], [place] [address]    Culture   Final    NO GROWTH Performed at [facility], [place] [address], [place], [place] [address]    Report Status 06/17/2019 FINAL  Final    RADIOLOGY:  No results found.   Management plans discussed with the patient, family and they are in agreement.  CODE STATUS: DNR   TOTAL TIME TAKING CARE OF THIS PATIENT: 37 minutes.    Diane Mullins M.D on 06/19/2019 at 2:12 PM  Between 7am to 6pm - Pager - [phone]  After 6pm go to [email or web address] - [employer] [facility]  Office  [phone]  CC: Primary care physician; [name], MD   Note: This dictation was prepared with Dragon dictation along with smaller phrase technology. Any transcriptional errors that result from this process are unintentional.

## 2019-06-19 NOTE — Telephone Encounter (Signed)
Called and spoke with daughter, ok per DPR. She verbalized undersatnding of Dr Donivan Scull recommendations.  She thinks patient does have follow up appointment with GI and will make sure. Patient has not been seen in our office since 07/2018. Daughter asked we call the patient to schedule.  Routing to scheduling to please call patient at the [phone] to schedule follow up with patient who lives at a facility.

## 2019-06-19 NOTE — Evaluation (Signed)
Physical Therapy Evaluation Patient Details Name: Diane Mullins MRN: [id number] DOB: 09-06-1933 Today's Date: 06/19/2019   History of Present Illness  85 year old female with PMHx of HLD, HTN, DM, chronic diastolic CHF, A-fib that presented to ED for AMS, weakness, admitted with severe anemia. Pt with recent hospitalization earlier this year for similiar episode. Underwent EGD and colonscopy as well as two transfusions this hospital stay. Most recent hemoglobin 9.8.    Clinical Impression  Patient alert, oriented to self, situation, place. Frustrated by repetitive hospitalizations, no pain at rest. Pt reported living at an assisted living facility that currently helps her physically transfer with bed mobility and to her WC. Needs assistance with all IADLs. Denies any recent falls.  The patient overall demonstrated generalized weakness/de-conditioning. Rolling performed mod I with use of  Bed rails to allow PT and RN to clean BM. Supine to sit with verbal cues for sequencing, modA to complete. Pt transferred to recliner with squat pivot, heavy use of UE and modA from PT. Overall the patient demonstrated mild deficits in PLOF, but would benefit from further skilled PT intervention to maximize function, safety, and independence.     Follow Up Recommendations Home health PT;Supervision for mobility/OOB    Equipment Recommendations  Other (comment)(Pt has RW and WC at home)    Recommendations for Other Services       Precautions / Restrictions Precautions Precautions: Fall Restrictions Weight Bearing Restrictions: No      Mobility  Bed Mobility Overal bed mobility: Needs Assistance Bed Mobility: Supine to Sit;Rolling Rolling: Modified independent (Device/Increase time)   Supine to sit: Mod assist     General bed mobility comments: use of bed rails for rolling. modA for supine to sit with verbal cues to improve patient participation  Transfers Overall transfer level: Needs  assistance Equipment used: None Transfers: Squat Pivot Transfers     Squat pivot transfers: Mod assist;From elevated surface        Ambulation/Gait             General Gait Details: unable  Stairs            Wheelchair Mobility    Modified Rankin (Stroke Patients Only)       Balance Overall balance assessment: Needs assistance Sitting-balance support: Feet supported Sitting balance-Leahy Scale: Fair                                       Pertinent Vitals/Pain Pain Assessment: No/denies pain    Home Living Family/patient expects to be discharged to:: Assisted living               Home Equipment: Walker - 4 wheels;Grab bars - tub/shower Additional Comments: Single level, no steps required.  Walk in shower with seat; grab bars throughout apartment. handicapped height toilet.    Prior Function Level of Independence: Needs assistance   Gait / Transfers Assistance Needed: Pt needs physical assist at baseline to transfer to [facility] as well as to transfer in/out of  bed, off commode  ADL's / Homemaking Assistance Needed: assistance needed for bathing/dressing  Comments: Pt has not ambulated in several months     Hand Dominance   Dominant Hand: Right    Extremity/Trunk Assessment   Upper Extremity Assessment Upper Extremity Assessment: Generalized weakness    Lower Extremity Assessment Lower Extremity Assessment: Generalized weakness    Cervical / Trunk Assessment Cervical /  Trunk Assessment: Kyphotic  Communication   Communication: HOH  Cognition Arousal/Alertness: Awake/alert Behavior During Therapy: WFL for tasks assessed/performed Overall Cognitive Status: Within Functional Limits for tasks assessed                                        General Comments      Exercises     Assessment/Plan    PT Assessment Patient needs continued PT services  PT Problem List Decreased strength;Decreased  mobility;Decreased activity tolerance;Decreased balance       PT Treatment Interventions DME instruction;Therapeutic exercise;Gait training;Balance training;Neuromuscular re-education;Functional mobility training;Therapeutic activities;Patient/family education    PT Goals (Current goals can be found in the Care Plan section)  Acute Rehab PT Goals Patient Stated Goal: to walk PT Goal Formulation: With patient Time For Goal Achievement: 07/03/19 Potential to Achieve Goals: Fair    Frequency Min 2X/week   Barriers to discharge        Co-evaluation               AM-PAC PT "6 Clicks" Mobility  Outcome Measure Help needed turning from your back to your side while in a flat bed without using bedrails?: A Lot Help needed moving from lying on your back to sitting on the side of a flat bed without using bedrails?: A Lot Help needed moving to and from a bed to a chair (including a wheelchair)?: A Lot Help needed standing up from a chair using your arms (e.g., wheelchair or bedside chair)?: Total Help needed to walk in hospital room?: Total Help needed climbing 3-5 steps with a railing? : Total 6 Click Score: 9    End of Session Equipment Utilized During Treatment: Gait belt Activity Tolerance: Patient tolerated treatment well Patient left: in chair;with chair alarm set;with call bell/phone within reach Nurse Communication: Mobility status PT Visit Diagnosis: Other abnormalities of gait and mobility (R26.89);Muscle weakness (generalized) (M62.81);Unsteadiness on feet (R26.81);Difficulty in walking, not elsewhere classified (R26.2)    Time: 4801-6553 PT Time Calculation (min) (ACUTE ONLY): 38 min   Charges:   PT Evaluation $PT Eval Moderate Complexity: 1 Mod PT Treatments $Therapeutic Activity: 23-37 mins        Lieutenant Diego PT, DPT 2:58 PM,06/19/19 [phone]

## 2019-06-19 NOTE — NC FL2 (Signed)
[place] LEVEL OF CARE SCREENING TOOL     IDENTIFICATION  Patient Name: Diane Mullins Birthdate: 1933/09/14 Sex: female Admission Date (Current Location): 06/16/2019  [place] and [place] Number:  Engineering geologist and Address:  [facility], [address] [place], [place] [address]      Provider Number: [id number]  Attending Physician Name and Address:  Otila Back, MD  Relative Name and Phone Number:  [name] [phone]    Current Level of Care: Hospital Recommended Level of Care: [place] Prior Approval Number:    Date Approved/Denied:   PASRR Number:    Discharge Plan: Domiciliary (Rest home)(Assisted Living)    Current Diagnoses: Patient Active Problem List   Diagnosis Date Noted  . GI bleed 06/16/2019  . B12 deficiency 04/06/2019  . Balance problem 04/06/2019  . Gastrointestinal hemorrhage with melena   . Symptomatic anemia   . Melena 12/11/2018  . Constipation 12/10/2018  . Atrial flutter ([place]) 10/17/2018  . 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 AKI (acute kidney injury) ([place])   . Acute on chronic diastolic heart failure ([place])   . Respiratory failure ([place]) 07/05/2018  . Coronary artery calcification seen on CAT scan 11/16/2017  . Morbid obesity ([place]) 10/30/2017  . (HFpEF) heart failure with preserved ejection fraction ([place]) 09/28/2017  . Family history of colon cancer 04/17/2017  . Closed right ankle fracture 10/28/2015  . Fatigue 10/18/2015  . TIA (transient ischemic attack) 09/18/2015  . Orthostatic hypotension 08/02/2015  . Adjustment disorder with anxious mood 03/30/2015  . Postmenopausal estrogen deficiency 06/03/2014  . DNR (do not resuscitate) discussion 03/04/2014  . Gait disturbance 05/07/2013  . Diabetes ([place]) 04/21/2013  . Shortness of breath 12/09/2012  . Hearing loss 12/09/2012  . Anxiety 01/31/2012  . Hypertension 12/22/2011  . Hyperlipidemia 12/22/2011  . Hypothyroidism  12/22/2011    Orientation RESPIRATION BLADDER Height & Weight     Self, Time, Situation, Place  Normal Incontinent Weight: 62.9 kg Height:  5\' 1"  (154.9 cm)  BEHAVIORAL SYMPTOMS/MOOD NEUROLOGICAL BOWEL NUTRITION STATUS      Incontinent Diet(soft diet)  AMBULATORY STATUS COMMUNICATION OF NEEDS Skin   Extensive Assist Verbally Normal                       Personal Care Assistance Level of Assistance  Bathing, Feeding, Dressing Bathing Assistance: Limited assistance Feeding assistance: Independent Dressing Assistance: Limited assistance     Functional Limitations Info             SPECIAL CARE FACTORS FREQUENCY  PT (By licensed PT)     PT Frequency: PT evaluate and treat              Contractures Contractures Info: Not present    Additional Factors Info  Code Status, Allergies Code Status Info: DNR Allergies Info: oxycodone             Discharge Medications: Medication List    STOP taking these medications   apixaban 5 MG Tabs tablet Commonly known as: Eliquis   Aspirin Low Dose 81 MG EC tablet Generic drug: aspirin   Insulin Glargine 100 UNIT/ML Solostar Pen Commonly known as: Lantus SoloStar   omeprazole 20 MG capsule Commonly known as: PRILOSEC     TAKE these medications   acetaminophen 325 MG tablet Commonly known as: TYLENOL Take 650 mg by mouth every 6 (six) hours as needed for mild pain or moderate pain.   amiodarone  200 MG tablet Commonly known as: PACERONE TAKE ONE TABLET BY MOUTH ONCE DAILY   carvedilol 3.125 MG tablet Commonly known as: Coreg Take 1 tablet (3.125 mg total) by mouth 2 (two) times daily. Hold for heart rate less than 60   cyanocobalamin 1000 MCG/ML injection Commonly known as: (VITAMIN B-12) Inject 1,000 mcg into the muscle every 30 (thirty) days. What changed: Another medication with the same name was changed. Make sure you understand how and when to take each.   [facility]S VITAMIN B12 1000 MCG  tablet Generic drug: cyanocobalamin TAKE 1 TABLET BY MOUTH EVERY DAY What changed: how much to take   FeroSul 325 (65 FE) MG tablet Generic drug: ferrous sulfate TAKE 1 TABLET BY MOUTH EVERY DAY FOR SUPPLEMENT What changed: See the new instructions.   furosemide 40 MG tablet Commonly known as: Lasix Take 1 tablet (40 mg total) by mouth daily. What changed:   medication strength  how much to take   glucose blood test strip Commonly known as: ONE TOUCH ULTRA TEST Use as instructed to check Blood sugar three times a day.  E11.65 Dispense patient choice.   Levemir FlexTouch 100 UNIT/ML Pen Generic drug: Insulin Detemir Inject 10 Units into the skin daily. What changed: See the new instructions.   levothyroxine 100 MCG tablet Commonly known as: SYNTHROID TAKE 1 TABLET BY MOUTH 30-60 MINUTES BEFORE BREAKFAST ON AN EMPTY STOMACH FOR THYROID REPLACEMENT What changed:   how much to take  how to take this  when to take this  additional instructions   meclizine 25 MG tablet Commonly known as: ANTIVERT Take 25 mg by mouth daily as needed for dizziness.   metFORMIN 1000 MG tablet Commonly known as: GLUCOPHAGE Take 1 tablet (1,000 mg total) by mouth 2 (two) times daily with a meal. TAKE 1 TABLET TWICE DAILY  WITH  A  MEAL   ondansetron 4 MG tablet Commonly known as: ZOFRAN Take 4 mg by mouth every 8 (eight) hours as needed for nausea.   pantoprazole 40 MG tablet Commonly known as: Protonix Take 1 tablet (40 mg total) by mouth daily.   Pen Needles 32G X 4 MM Misc Inject 1 Dose into the skin daily.   polyethylene glycol 17 g packet Commonly known as: MIRALAX / GLYCOLAX Take 17 g by mouth daily as needed for moderate constipation.   potassium chloride 10 MEQ tablet Commonly known as: KLOR-CON TAKE 1 TABLET BY MOUTH EVERY DAY FOR SUPPLEMENT What changed: See the new instructions.   simvastatin 20 MG tablet Commonly known as: ZOCOR TAKE 1 TABLET BY  MOUTH EACH NIGHT AT BEDTIME FOR CHOLESTEROL What changed: See the new instructions.         Relevant Imaging Results:  Relevant Lab Results:   Additional Information SS# [id number]  Shelbie Hutching, RN

## 2019-06-19 NOTE — Progress Notes (Signed)
Cephas Darby, MD [address]  [place]  [place], [place] [address]  Main: [phone]  Fax: [phone] Pager: [phone]   Subjective: No acute events overnight, no reported episodes of melena.  Patient underwent EGD yesterday which was unremarkable followed by a video capsule study.   Objective: Vital signs in last 24 hours: Vitals:   06/18/19 1553 06/18/19 1948 06/19/19 0353 06/19/19 1038  BP: (!) 160/70 (!) 152/63 (!) 146/64 140/63  Pulse: 68 70 64 75  Resp: 20 19 16    Temp: 98.8 F (37.1 C) 98.9 F (37.2 C) 98.1 F (36.7 C)   TempSrc: Oral  Oral   SpO2: 100% 93% 94% 93%  Weight:      Height:       Weight change:   Intake/Output Summary (Last 24 hours) at 06/19/2019 1239 Last data filed at 06/19/2019 1202 Gross per 24 hour  Intake 612.1 ml  Output 3100 ml  Net -2487.9 ml     Exam: Heart:: Regular rate and rhythm, S1S2 present or without murmur or extra heart sounds Lungs: normal and clear to auscultation Abdomen: Soft, nontender, nondistended   Lab Results: CBC Latest Ref Rng & Units 06/19/2019 06/17/2019 06/17/2019  WBC 4.0 - 10.5 K/uL 7.0 9.2 -  Hemoglobin 12.0 - 15.0 g/dL 9.8(L) 7.8(L) 7.7(L)  Hematocrit 36.0 - 46.0 % 31.3(L) 24.7(L) 23.9(L)  Platelets 150 - 400 K/uL 407(H) 339 -   CMP Latest Ref Rng & Units 06/17/2019 06/16/2019 04/01/2019  Glucose 70 - 99 mg/dL 108(H) 126(H) 121(H)  BUN 8 - 23 mg/dL 16 19 13   Creatinine 0.44 - 1.00 mg/dL 1.19(H) 1.03(H) 1.02  Sodium 135 - 145 mmol/L 141 139 140  Potassium 3.5 - 5.1 mmol/L 3.9 4.3 4.5  Chloride 98 - 111 mmol/L 104 103 100  CO2 22 - 32 mmol/L 27 27 31   Calcium 8.9 - 10.3 mg/dL 8.4(L) 8.5(L) 8.9  Total Protein 6.5 - 8.1 g/dL - 5.9(L) 6.2  Total Bilirubin 0.3 - 1.2 mg/dL - 0.5 0.2  Alkaline Phos 38 - 126 U/L - 64 75  AST 15 - 41 U/L - 15 13  ALT 0 - 44 U/L - 12 10    Micro Results: Recent Results (from the past 240 hour(s))  SARS Coronavirus 2 [facility] order, Performed in [facility] hospital lab) Nasopharyngeal Nasopharyngeal Swab     Status: None   Collection Time: 06/16/19 10:55 AM   Specimen: Nasopharyngeal Swab  Result Value Ref Range Status   SARS Coronavirus 2 NEGATIVE NEGATIVE Final    Comment: (NOTE) If result is NEGATIVE SARS-CoV-2 target nucleic acids are NOT DETECTED. The SARS-CoV-2 RNA is generally detectable in upper and lower  respiratory specimens during the acute phase of infection. The lowest  concentration of SARS-CoV-2 viral copies this assay can detect is 250  copies / mL. A negative result does not preclude SARS-CoV-2 infection  and should not be used as the sole basis for treatment or other  patient management decisions.  A negative result may occur with  improper specimen collection / handling, submission of specimen other  than nasopharyngeal swab, presence of viral mutation(s) within the  areas targeted by this assay, and inadequate number of viral copies  (<250 copies / mL). A negative result must be combined with clinical  observations, patient history, and epidemiological information. If result is POSITIVE SARS-CoV-2 target nucleic acids are DETECTED. The SARS-CoV-2 RNA is generally detectable in upper and lower  respiratory specimens dur ing the  acute phase of infection.  Positive  results are indicative of active infection with SARS-CoV-2.  Clinical  correlation with patient history and other diagnostic information is  necessary to determine patient infection status.  Positive results do  not rule out bacterial infection or co-infection with other viruses. If result is PRESUMPTIVE POSTIVE SARS-CoV-2 nucleic acids MAY BE PRESENT.   A presumptive positive result was obtained on the submitted specimen  and confirmed on repeat testing.  While 2019 novel coronavirus  (SARS-CoV-2) nucleic acids may be present in the submitted sample  additional confirmatory testing may be necessary for epidemiological  and / or clinical management  purposes  to differentiate between  SARS-CoV-2 and other Sarbecovirus currently known to infect humans.  If clinically indicated additional testing with an alternate test  methodology [phone]) is advised. The SARS-CoV-2 RNA is generally  detectable in upper and lower respiratory sp ecimens during the acute  phase of infection. The expected result is Negative. Fact Sheet for Patients:  StrictlyIdeas.no Fact Sheet for Healthcare Providers: BankingDealers.co.za This test is not yet approved or cleared by the [place] FDA and has been authorized for detection and/or diagnosis of SARS-CoV-2 by FDA under an Emergency Use Authorization (EUA).  This EUA will remain in effect (meaning this test can be used) for the duration of the COVID-19 declaration under Section 564(b)(1) of the Act, 21 U.S.C. section 360bbb-3(b)(1), unless the authorization is terminated or revoked sooner. Performed at [facility], [address] [place], [place] [address]   Urine culture     Status: None   Collection Time: 06/16/19 12:53 PM   Specimen: Urine, Clean Catch  Result Value Ref Range Status   Specimen Description   Final    URINE, CLEAN CATCH Performed at [facility], [address], [place], [place] [address]    Special Requests   Final    NONE Performed at [facility], [address], [place], [place] [address]    Culture   Final    NO GROWTH Performed at [facility], [place] [address] [place],  [address]    Report Status 06/17/2019 FINAL  Final   Studies/Results: No results found. Medications:  I have reviewed the patient's current medications. Prior to Admission:  Medications Prior to Admission  Medication Sig Dispense Refill Last Dose  . acetaminophen (TYLENOL) 325 MG tablet Take 650 mg by mouth every 6 (six) hours as needed for mild pain or moderate pain.    Unknown at PRN  . amiodarone  (PACERONE) 200 MG tablet TAKE ONE TABLET BY MOUTH ONCE DAILY (Patient taking differently: Take 200 mg by mouth daily. ) 30 tablet 1 06/18/2019 at 0900  . apixaban (ELIQUIS) 5 MG TABS tablet Take 1 tablet (5 mg total) by mouth 2 (two) times daily. 180 tablet 2 06/16/2019 at 0900  . ASPIRIN LOW DOSE 81 MG EC tablet TAKE ONE TABLET BY MOUTH ONCE DAILY (Patient taking differently: Take 81 mg by mouth daily. ) 30 tablet 0 06/16/2019 at 0900  . CVS VITAMIN B12 1000 MCG tablet TAKE 1 TABLET BY MOUTH EVERY DAY (Patient taking differently: Take 1,000 mcg by mouth daily. ) 90 tablet 1 06/16/2019 at 0900  . cyanocobalamin (,VITAMIN B-12,) 1000 MCG/ML injection Inject 1,000 mcg into the muscle every 30 (thirty) days.   05/22/2019 at Unknown  . FEROSUL 325 (65 Fe) MG tablet TAKE 1 TABLET BY MOUTH EVERY DAY FOR SUPPLEMENT (Patient taking differently: Take 325 mg by mouth daily. )  30 tablet 0 06/16/2019 at 0900  . furosemide (LASIX) 20 MG tablet TAKE ONE TABLET BY MOUTH ONCE DAILY (Patient taking differently: Take 20 mg by mouth daily. ) 30 tablet 2 06/16/2019 at 0900  . glucose blood (ONE TOUCH ULTRA TEST) test strip Use as instructed to check Blood sugar three times a day.  E11.65 Dispense patient choice. 300 each 3   . Insulin Pen Needle (PEN NEEDLES) 32G X 4 MM MISC Inject 1 Dose into the skin daily. 90 each 3   . LEVEMIR FLEXTOUCH 100 UNIT/ML Pen INJECT 15 UNITS SUB-Q EVERY MORNING (Patient taking differently: Inject 15 Units into the skin daily. ) 6 mL 1 06/16/2019 at 0900  . levothyroxine (SYNTHROID) 100 MCG tablet TAKE 1 TABLET BY MOUTH 30-60 MINUTES BEFORE BREAKFAST ON AN EMPTY STOMACH FOR THYROID REPLACEMENT (Patient taking differently: Take 100 mcg by mouth daily before breakfast. ) 90 tablet 3 06/16/2019 at 0900  . meclizine (ANTIVERT) 25 MG tablet Take 25 mg by mouth daily as needed for dizziness.   Unknown at PRN  . metFORMIN (GLUCOPHAGE) 1000 MG tablet Take 1 tablet (1,000 mg total) by mouth 2 (two) times daily  with a meal. TAKE 1 TABLET TWICE DAILY  WITH  A  MEAL 180 tablet 3 06/16/2019 at 0900  . ondansetron (ZOFRAN) 4 MG tablet Take 4 mg by mouth every 8 (eight) hours as needed for nausea.   Unknown at PRN  . polyethylene glycol (MIRALAX / GLYCOLAX) packet Take 17 g by mouth daily as needed for moderate constipation.    Unknown at PRN  . potassium chloride (K-DUR) 10 MEQ tablet TAKE 1 TABLET BY MOUTH EVERY DAY FOR SUPPLEMENT (Patient taking differently: Take 10 mEq by mouth daily. ) 30 tablet 1 06/16/2019 at 0900  . simvastatin (ZOCOR) 20 MG tablet TAKE 1 TABLET BY MOUTH EACH NIGHT AT BEDTIME FOR CHOLESTEROL (Patient taking differently: Take 20 mg by mouth at bedtime. ) 30 tablet 1 06/15/2019 at 2000  . Insulin Glargine (LANTUS SOLOSTAR) 100 UNIT/ML Solostar Pen INJECT  17 UNITS SUBCUTANEOUSLY IN THE MORNING (Patient not taking: Reported on 06/16/2019) 15 mL 2 Not Taking at Unknown time  . omeprazole (PRILOSEC) 20 MG capsule TAKE 1 CAPSULE BY MOUTH EVERY DAY IN THEMORNING 30 capsule 0    Scheduled: . amiodarone  200 mg Oral Daily  . furosemide  40 mg Intravenous Daily  . furosemide  40 mg Intravenous Once  . insulin aspart  0-5 Units Subcutaneous QHS  . insulin aspart  0-9 Units Subcutaneous TID WC  . levothyroxine  100 mcg Oral QAC breakfast  . potassium chloride  10 mEq Oral Daily  . simvastatin  20 mg Oral QHS  . cyanocobalamin  1,000 mcg Oral Daily   Continuous: . iron sucrose    . pantoprozole (PROTONIX) infusion 8 mg/hr (06/19/19 1202)   PPI:[id number], meclizine, ondansetron Anti-infectives (From admission, onward)   None     Scheduled Meds: . amiodarone  200 mg Oral Daily  . furosemide  40 mg Intravenous Daily  . furosemide  40 mg Intravenous Once  . insulin aspart  0-5 Units Subcutaneous QHS  . insulin aspart  0-9 Units Subcutaneous TID WC  . levothyroxine  100 mcg Oral QAC breakfast  . potassium chloride  10 mEq Oral Daily  . simvastatin  20 mg Oral QHS  . cyanocobalamin   1,000 mcg Oral Daily   Continuous Infusions: . iron sucrose    . pantoprozole (PROTONIX) infusion 8 mg/hr (06/19/19  1202)   PRN Meds:.acetaminophen, meclizine, ondansetron   Assessment: Active Problems:   GI bleed  Video capsule study is incomplete as capsule did not reach the cecum.  Capsule retained in stomach for 6hours.  Small bowel transit time was 3 and half hours.  There was presence of 5 mm clean-based ulcer in proximal to mid small bowel.  Rest of the small bowel appeared normal on the limited exam.  There was no old blood or fresh blood in the small bowel Hemoglobin is improving  Plan: Cardiology is okay with stop anticoagulation due to recurrent GI bleed which is appropriate in the setting of small intestine ulcer as patient is at high risk for rebleeding from the ulcer upon resumption of anticoagulation or full dose antiplatelet therapy If there is a consideration to restart anticoagulation in future, would recommend a repeat video capsule endoscopy to confirm healing of small blood ulcer Recommend GI follow-up in 3 to 4 weeks after discharge Recommend Protonix 40 mg daily before breakfast She has severe B12 and iron deficiency anemia, recommend Venofer today and restart parenteral B12 Patient can be discharged today Discussed in detail over phone with patient's daughter, [name] who is her healthcare POA   LOS: 3 days   Mordechai Matuszak 06/19/2019, 12:39 PM

## 2019-06-19 NOTE — TOC Transition Note (Signed)
Transition of Care [facility] - CM/SW Discharge Note   Patient Details  Name: Diane Mullins MRN: [id number] Date of Birth: 22-Nov-1932  Transition of [facility]) CM/SW Contact:  Shelbie Hutching, RN Phone Number: 06/19/2019, 2:58 PM   Clinical Narrative:     Patient medically stable for discharge.  Patient will discharge back to [facility] in [place].  Patient's daughter [name] aware of discharge plans.  [facility] will be here at the hospital at 3:30 to pick patient up.  [facility] has PT available at the facility, order on the [facility] for PT to evaluate and treat.   Final next level of care: Assisted Living Barriers to Discharge: Barriers Resolved   Patient Goals and CMS Choice     Choice offered to / list presented to : NA  Discharge Placement              Patient chooses bed at: ([facility]) Patient to be transferred to facility by: [facility] to provide [facility] Name of family member notified: [name] Patient and family notified of of transfer: 06/19/19  Discharge Plan and Services                                     Social Determinants of Health (SDOH) Interventions     Readmission Risk Interventions Readmission Risk Prevention Plan 12/16/2018 12/15/2018 12/14/2018  Transportation Screening Complete Complete Complete  PCP or Specialist Appt within 5-7 Days - - -  PCP or Specialist Appt within 3-5 Days Complete Patient refused -  Home Care Screening - - -  Medication Review (RN CM) - - -  HRI or Home Care Consult Complete Complete -  Social Work Consult for [place] Planning/Counseling Complete Complete -  Palliative Care Screening Not Applicable Not Applicable -  Medication Review Press photographer) Complete Complete Not Complete  Med Review Comments - - Review upon discharge  PCP or Specialist appointment within 3-5 days of discharge - - Not Complete  PCP/Specialist Appt Not Complete comments - - Review prior to  discharge for appointment with cardiology  [place] or [place] - - Complete  SW Recovery Care/Counseling Consult - - Complete  [place] - - Complete  Some recent data might be hidden

## 2019-06-19 NOTE — Telephone Encounter (Signed)
She will likely not be able to go back on a blood thinner moving forward given her bleeding and anemia. She was previously supposed to have a lab drawn through her facility to check her hemoglobin to ensure that it did not drop after going back on the eliquis. I discussed her restarting this with Dr Rockey Situ and we wanted her to have follow-up labs so that we could confirm that her hemoglobin did not drop after going back on the eliquis. We confirmed that these would be done in the telephone encounter from 8/21, though it does not look like we ever received these labs. Given the repeat anemia and GI bleed I would advise against her going on any blood thinners unless they can identify a specific cause of the bleeding and fix it.

## 2019-06-20 ENCOUNTER — Encounter: Payer: Self-pay | Admitting: Gastroenterology

## 2019-06-20 ENCOUNTER — Telehealth: Payer: Self-pay | Admitting: Family Medicine

## 2019-06-20 NOTE — Telephone Encounter (Signed)
Pts daughter returning call. Please advise.

## 2019-06-20 NOTE — Telephone Encounter (Signed)
Tried to return call to daughter no answer.

## 2019-06-20 NOTE — Anesthesia Postprocedure Evaluation (Signed)
Anesthesia Post Note  Patient: ELYSA WOMAC  Procedure(s) Performed: ESOPHAGOGASTRODUODENOSCOPY (EGD) (N/A )  Patient location during evaluation: Endoscopy Anesthesia Type: General Level of consciousness: awake and alert and oriented Pain management: pain level controlled Vital Signs Assessment: post-procedure vital signs reviewed and stable Respiratory status: spontaneous breathing, nonlabored ventilation and respiratory function stable Cardiovascular status: blood pressure returned to baseline and stable Postop Assessment: no signs of nausea or vomiting Anesthetic complications: no     Last Vitals:  Vitals:   06/19/19 0353 06/19/19 1038  BP: (!) 146/64 140/63  Pulse: 64 75  Resp: 16   Temp: 36.7 C   SpO2: 94% 93%    Last Pain:  Vitals:   06/19/19 1300  TempSrc:   PainSc: 0-No pain                 Aalani Aikens

## 2019-06-20 NOTE — Telephone Encounter (Signed)
I called to speak with the daughter of the pateint, [name] her number is [phone], I called to inform her that the provider thinks its best that the patient not take any blood thinners  At all due to the anemia and GI bleeds until they can identify a cause of the bleeding and fix it.  I left a voicemail for the daughter to return a call to me. Bunnie Lederman,cma

## 2019-06-20 NOTE — Telephone Encounter (Signed)
First attempt for Transitional Care Management call, left message for patient to cal office , will continue to monitor and attempt TCM. 

## 2019-06-23 ENCOUNTER — Emergency Department: Payer: PPO

## 2019-06-23 ENCOUNTER — Encounter: Payer: Self-pay | Admitting: Emergency Medicine

## 2019-06-23 ENCOUNTER — Other Ambulatory Visit: Payer: Self-pay

## 2019-06-23 ENCOUNTER — Inpatient Hospital Stay
Admission: EM | Admit: 2019-06-23 | Discharge: 2019-06-25 | DRG: 683 | Payer: PPO | Attending: Internal Medicine | Admitting: Internal Medicine

## 2019-06-23 DIAGNOSIS — Z20828 Contact with and (suspected) exposure to other viral communicable diseases: Secondary | ICD-10-CM | POA: Diagnosis not present

## 2019-06-23 DIAGNOSIS — N390 Urinary tract infection, site not specified: Secondary | ICD-10-CM | POA: Diagnosis not present

## 2019-06-23 DIAGNOSIS — Z66 Do not resuscitate: Secondary | ICD-10-CM | POA: Diagnosis not present

## 2019-06-23 DIAGNOSIS — K219 Gastro-esophageal reflux disease without esophagitis: Secondary | ICD-10-CM | POA: Diagnosis not present

## 2019-06-23 DIAGNOSIS — R58 Hemorrhage, not elsewhere classified: Secondary | ICD-10-CM | POA: Diagnosis not present

## 2019-06-23 DIAGNOSIS — E039 Hypothyroidism, unspecified: Secondary | ICD-10-CM | POA: Diagnosis present

## 2019-06-23 DIAGNOSIS — I272 Pulmonary hypertension, unspecified: Secondary | ICD-10-CM | POA: Diagnosis present

## 2019-06-23 DIAGNOSIS — I110 Hypertensive heart disease with heart failure: Secondary | ICD-10-CM | POA: Diagnosis present

## 2019-06-23 DIAGNOSIS — E119 Type 2 diabetes mellitus without complications: Secondary | ICD-10-CM | POA: Diagnosis not present

## 2019-06-23 DIAGNOSIS — Z800 Family history of malignant neoplasm of digestive organs: Secondary | ICD-10-CM

## 2019-06-23 DIAGNOSIS — N3000 Acute cystitis without hematuria: Secondary | ICD-10-CM | POA: Diagnosis not present

## 2019-06-23 DIAGNOSIS — I5032 Chronic diastolic (congestive) heart failure: Secondary | ICD-10-CM | POA: Diagnosis not present

## 2019-06-23 DIAGNOSIS — R531 Weakness: Secondary | ICD-10-CM | POA: Diagnosis not present

## 2019-06-23 DIAGNOSIS — Z833 Family history of diabetes mellitus: Secondary | ICD-10-CM | POA: Diagnosis not present

## 2019-06-23 DIAGNOSIS — Z807 Family history of other malignant neoplasms of lymphoid, hematopoietic and related tissues: Secondary | ICD-10-CM | POA: Diagnosis not present

## 2019-06-23 DIAGNOSIS — Z794 Long term (current) use of insulin: Secondary | ICD-10-CM

## 2019-06-23 DIAGNOSIS — Z885 Allergy status to narcotic agent status: Secondary | ICD-10-CM | POA: Diagnosis not present

## 2019-06-23 DIAGNOSIS — Z8249 Family history of ischemic heart disease and other diseases of the circulatory system: Secondary | ICD-10-CM | POA: Diagnosis not present

## 2019-06-23 DIAGNOSIS — D649 Anemia, unspecified: Secondary | ICD-10-CM | POA: Diagnosis present

## 2019-06-23 DIAGNOSIS — Z8673 Personal history of transient ischemic attack (TIA), and cerebral infarction without residual deficits: Secondary | ICD-10-CM | POA: Diagnosis not present

## 2019-06-23 DIAGNOSIS — E860 Dehydration: Secondary | ICD-10-CM | POA: Diagnosis present

## 2019-06-23 DIAGNOSIS — I959 Hypotension, unspecified: Secondary | ICD-10-CM | POA: Diagnosis not present

## 2019-06-23 DIAGNOSIS — E785 Hyperlipidemia, unspecified: Secondary | ICD-10-CM | POA: Diagnosis not present

## 2019-06-23 DIAGNOSIS — I482 Chronic atrial fibrillation, unspecified: Secondary | ICD-10-CM | POA: Diagnosis present

## 2019-06-23 DIAGNOSIS — N179 Acute kidney failure, unspecified: Secondary | ICD-10-CM | POA: Diagnosis not present

## 2019-06-23 DIAGNOSIS — R262 Difficulty in walking, not elsewhere classified: Secondary | ICD-10-CM | POA: Diagnosis not present

## 2019-06-23 DIAGNOSIS — R410 Disorientation, unspecified: Secondary | ICD-10-CM | POA: Diagnosis not present

## 2019-06-23 LAB — CBC WITH DIFFERENTIAL/PLATELET
Abs Immature Granulocytes: 0.04 10*3/uL (ref 0.00–0.07)
Basophils Absolute: 0.1 10*3/uL (ref 0.0–0.1)
Basophils Relative: 1 %
Eosinophils Absolute: 0.2 10*3/uL (ref 0.0–0.5)
Eosinophils Relative: 3 %
HCT: 31.5 % — ABNORMAL LOW (ref 36.0–46.0)
Hemoglobin: 9.5 g/dL — ABNORMAL LOW (ref 12.0–15.0)
Immature Granulocytes: 1 %
Lymphocytes Relative: 22 %
Lymphs Abs: 1.7 10*3/uL (ref 0.7–4.0)
MCH: 28.8 pg (ref 26.0–34.0)
MCHC: 30.2 g/dL (ref 30.0–36.0)
MCV: 95.5 fL (ref 80.0–100.0)
Monocytes Absolute: 1 10*3/uL (ref 0.1–1.0)
Monocytes Relative: 13 %
Neutro Abs: 4.6 10*3/uL (ref 1.7–7.7)
Neutrophils Relative %: 60 %
Platelets: 366 10*3/uL (ref 150–400)
RBC: 3.3 MIL/uL — ABNORMAL LOW (ref 3.87–5.11)
RDW: 17.7 % — ABNORMAL HIGH (ref 11.5–15.5)
WBC: 7.6 10*3/uL (ref 4.0–10.5)
nRBC: 0 % (ref 0.0–0.2)

## 2019-06-23 LAB — COMPREHENSIVE METABOLIC PANEL
ALT: 16 U/L (ref 0–44)
AST: 19 U/L (ref 15–41)
Albumin: 3.4 g/dL — ABNORMAL LOW (ref 3.5–5.0)
Alkaline Phosphatase: 85 U/L (ref 38–126)
Anion gap: 11 (ref 5–15)
BUN: 17 mg/dL (ref 8–23)
CO2: 27 mmol/L (ref 22–32)
Calcium: 8.6 mg/dL — ABNORMAL LOW (ref 8.9–10.3)
Chloride: 99 mmol/L (ref 98–111)
Creatinine, Ser: 1.86 mg/dL — ABNORMAL HIGH (ref 0.44–1.00)
GFR calc Af Amer: 28 mL/min — ABNORMAL LOW (ref >60)
GFR calc non Af Amer: 24 mL/min — ABNORMAL LOW (ref >60)
Glucose, Bld: 133 mg/dL — ABNORMAL HIGH (ref 70–99)
Potassium: 4.5 mmol/L (ref 3.5–5.1)
Sodium: 137 mmol/L (ref 135–145)
Total Bilirubin: 0.4 mg/dL (ref 0.3–1.2)
Total Protein: 6.3 g/dL — ABNORMAL LOW (ref 6.5–8.1)

## 2019-06-23 LAB — TROPONIN I (HIGH SENSITIVITY)
Troponin I (High Sensitivity): 14 ng/L (ref <18)
Troponin I (High Sensitivity): 15 ng/L (ref <18)

## 2019-06-23 LAB — SARS CORONAVIRUS 2 (TAT 6-24 HRS): SARS Coronavirus 2: NEGATIVE

## 2019-06-23 MED ORDER — POLYETHYLENE GLYCOL 3350 17 G PO PACK: 17.0000 g | PACK | Freq: Every day | ORAL | Status: DC | PRN

## 2019-06-23 MED ORDER — VITAMIN B-12 1000 MCG PO TABS
1000.0000 ug | ORAL_TABLET | Freq: Every day | ORAL | Status: DC
  Administered 2019-06-24 – 2019-06-25 (×2): 1000 ug via ORAL
  Filled 2019-06-23 (×2): qty 1

## 2019-06-23 MED ORDER — INSULIN DETEMIR 100 UNIT/ML ~~LOC~~ SOLN
10.0000 [IU] | Freq: Every day | SUBCUTANEOUS | Status: DC
  Administered 2019-06-24: 10 [IU] via SUBCUTANEOUS
  Filled 2019-06-23 (×2): qty 0.1

## 2019-06-23 MED ORDER — ACETAMINOPHEN 325 MG PO TABS: 650.0000 mg | ORAL_TABLET | Freq: Four times a day (QID) | ORAL | Status: DC | PRN

## 2019-06-23 MED ORDER — POTASSIUM CHLORIDE CRYS ER 10 MEQ PO TBCR
10.0000 meq | EXTENDED_RELEASE_TABLET | Freq: Every day | ORAL | Status: DC
  Administered 2019-06-24 – 2019-06-25 (×2): 10 meq via ORAL
  Filled 2019-06-23 (×3): qty 1

## 2019-06-23 MED ORDER — PANTOPRAZOLE SODIUM 40 MG PO TBEC
40.0000 mg | DELAYED_RELEASE_TABLET | Freq: Every day | ORAL | Status: DC
  Administered 2019-06-24 – 2019-06-25 (×2): 40 mg via ORAL
  Filled 2019-06-23 (×2): qty 1

## 2019-06-23 MED ORDER — ONDANSETRON HCL 4 MG PO TABS: 4.0000 mg | ORAL_TABLET | Freq: Four times a day (QID) | ORAL | Status: DC | PRN

## 2019-06-23 MED ORDER — FERROUS SULFATE 325 (65 FE) MG PO TABS
325.0000 mg | ORAL_TABLET | Freq: Every day | ORAL | Status: DC
  Administered 2019-06-24 – 2019-06-25 (×2): 325 mg via ORAL
  Filled 2019-06-23 (×2): qty 1

## 2019-06-23 MED ORDER — ACETAMINOPHEN 650 MG RE SUPP: 650.0000 mg | Freq: Four times a day (QID) | RECTAL | Status: DC | PRN

## 2019-06-23 MED ORDER — ONDANSETRON HCL 4 MG/2ML IJ SOLN
4.0000 mg | Freq: Once | INTRAMUSCULAR | Status: AC
  Administered 2019-06-23: 4 mg via INTRAVENOUS
  Filled 2019-06-23: qty 2

## 2019-06-23 MED ORDER — ONDANSETRON HCL 4 MG/2ML IJ SOLN: 4.0000 mg | Freq: Four times a day (QID) | INTRAMUSCULAR | Status: DC | PRN

## 2019-06-23 MED ORDER — AMIODARONE HCL 200 MG PO TABS
200.0000 mg | ORAL_TABLET | Freq: Every day | ORAL | Status: DC
  Administered 2019-06-24 – 2019-06-25 (×2): 200 mg via ORAL
  Filled 2019-06-23 (×2): qty 1

## 2019-06-23 MED ORDER — LEVOTHYROXINE SODIUM 100 MCG PO TABS
100.0000 ug | ORAL_TABLET | Freq: Every day | ORAL | Status: DC
  Administered 2019-06-24 – 2019-06-25 (×2): 100 ug via ORAL
  Filled 2019-06-23 (×2): qty 1

## 2019-06-23 MED ORDER — HEPARIN SODIUM (PORCINE) 5000 UNIT/ML IJ SOLN
5000.0000 [IU] | Freq: Three times a day (TID) | INTRAMUSCULAR | Status: DC
  Administered 2019-06-23 – 2019-06-25 (×5): 5000 [IU] via SUBCUTANEOUS
  Filled 2019-06-23 (×5): qty 1

## 2019-06-23 MED ORDER — SODIUM CHLORIDE 0.9 % IV SOLN
INTRAVENOUS | Status: DC
  Administered 2019-06-23 – 2019-06-24 (×2): via INTRAVENOUS

## 2019-06-23 MED ORDER — CARVEDILOL 3.125 MG PO TABS
3.1250 mg | ORAL_TABLET | Freq: Two times a day (BID) | ORAL | Status: DC
  Administered 2019-06-24 – 2019-06-25 (×3): 3.125 mg via ORAL
  Filled 2019-06-23 (×4): qty 1

## 2019-06-23 MED ORDER — SIMVASTATIN 20 MG PO TABS
20.0000 mg | ORAL_TABLET | Freq: Every day | ORAL | Status: DC
  Administered 2019-06-23 – 2019-06-24 (×2): 20 mg via ORAL
  Filled 2019-06-23 (×2): qty 1

## 2019-06-23 MED ORDER — MECLIZINE HCL 25 MG PO TABS
25.0000 mg | ORAL_TABLET | Freq: Every day | ORAL | Status: DC | PRN
  Filled 2019-06-23: qty 1

## 2019-06-23 MED ORDER — LACTATED RINGERS IV BOLUS
1000.0000 mL | Freq: Once | INTRAVENOUS | Status: AC
  Administered 2019-06-23: 15:00:00 1000 mL via INTRAVENOUS

## 2019-06-23 NOTE — ED Notes (Signed)
Patient transported to X-ray 

## 2019-06-23 NOTE — ED Notes (Signed)
Pt clean and dry when leaving ER.  Pt alert.

## 2019-06-23 NOTE — ED Notes (Signed)
Report  Called to [facility] rn floor nurse

## 2019-06-23 NOTE — ED Notes (Signed)
Pt sleeping. 

## 2019-06-23 NOTE — ED Provider Notes (Signed)
[facility] Emergency Department Provider Note   ____________________________________________   First MD Initiated Contact with Patient 06/23/19 1326     (approximate)  I have reviewed the triage vital signs and the nursing notes.   HISTORY  Chief Complaint Weakness    HPI Diane Mullins is a 85 y.o. female with past medical history of atrial fibrillation, CHF, hypertension, diabetes who presents to the ED for generalized weakness.  Patient was recently discharged from the hospital for GI bleed 4 days prior and states she was doing well at that time.  She states she started feeling very weak overnight last night into this morning.  She states weakness affects her entire body and she denies any associated vision changes, speech changes, or numbness.  She states she has continued to notice some dark black stools and has been feeling nauseous.  She denies any fevers, chills, cough, chest pain, shortness of breath, dysuria, or hematuria.        Past Medical History:  Diagnosis Date  . Atrial fibrillation ([place])   . Benign paroxysmal positional vertigo 07/17/2014  . Chronic diastolic CHF (congestive heart failure) ([place])    a. echo 10/19: EF of 60-65%, no RWMA, Gr2DD, calcified mitral annulus with mild MR, mildly to moderately dilated left atrium, moderate TR, PASP 52 mmHg  . Diabetes mellitus   . H/O: rheumatic fever   . Hyperlipidemia   . Hypertension   . Pulmonary hypertension ([place])   . Stroke [facility])    TIA Jan. 1st  . Teratoma of left ovary   . Thyroid disease     Patient Active Problem List   Diagnosis Date Noted  . Generalized weakness 06/23/2019  . GI bleed 06/16/2019  . B12 deficiency 04/06/2019  . Balance problem 04/06/2019  . Gastrointestinal hemorrhage with melena   . Symptomatic anemia   . Melena 12/11/2018  . Constipation 12/10/2018  . Atrial flutter ([place]) 10/17/2018  . 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 AKI (acute kidney injury)  ([place])   . Acute on chronic diastolic heart failure ([place])   . Respiratory failure ([place]) 07/05/2018  . Coronary artery calcification seen on CAT scan 11/16/2017  . Morbid obesity ([place]) 10/30/2017  . (HFpEF) heart failure with preserved ejection fraction ([place]) 09/28/2017  . Family history of colon cancer 04/17/2017  . Closed right ankle fracture 10/28/2015  . Fatigue 10/18/2015  . TIA (transient ischemic attack) 09/18/2015  . Orthostatic hypotension 08/02/2015  . Adjustment disorder with anxious mood 03/30/2015  . Postmenopausal estrogen deficiency 06/03/2014  . DNR (do not resuscitate) discussion 03/04/2014  . Gait disturbance 05/07/2013  . Diabetes ([place]) 04/21/2013  . Shortness of breath 12/09/2012  . Hearing loss 12/09/2012  . Anxiety 01/31/2012  . Hypertension 12/22/2011  . Hyperlipidemia 12/22/2011  . Hypothyroidism 12/22/2011    Past Surgical History:  Procedure Laterality Date  . ABDOMINAL HYSTERECTOMY  1973   menorrhagia  . CARDIOVERSION N/A 10/22/2018   Procedure: CARDIOVERSION;  Surgeon: Nelva Bush, MD;  Location: [facility] ORS;  Service: Cardiovascular;  Laterality: N/A;  . CARDIOVERSION N/A 11/26/2018   Procedure: CARDIOVERSION (CATH LAB);  Surgeon: Minna Merritts, MD;  Location: [facility] ORS;  Service: Cardiovascular;  Laterality: N/A;  . COLONOSCOPY WITH PROPOFOL N/A 12/16/2018   Procedure: COLONOSCOPY WITH PROPOFOL;  Surgeon: Lin Landsman, MD;  Location: [facility] ENDOSCOPY;  Service: Gastroenterology;  Laterality: N/A;  . ENTEROSCOPY N/A 12/16/2018   Procedure: Push ENTEROSCOPY;  Surgeon: Lin Landsman, MD;  Location: [facility]  ENDOSCOPY;  Service: Gastroenterology;  Laterality: N/A;  . ESOPHAGOGASTRODUODENOSCOPY N/A 06/18/2019   Procedure: ESOPHAGOGASTRODUODENOSCOPY (EGD);  Surgeon: Lin Landsman, MD;  Location: [facility] ENDOSCOPY;  Service: Gastroenterology;  Laterality: N/A;  . ESOPHAGOGASTRODUODENOSCOPY (EGD) WITH PROPOFOL N/A 12/14/2018   Procedure:  ESOPHAGOGASTRODUODENOSCOPY (EGD) WITH PROPOFOL;  Surgeon: Lucilla Lame, MD;  Location: [facility] ENDOSCOPY;  Service: Endoscopy;  Laterality: N/A;  . GIVENS CAPSULE STUDY N/A 06/18/2019   Procedure: GIVENS CAPSULE STUDY;  Surgeon: Lin Landsman, MD;  Location: [facility] ENDOSCOPY;  Service: Gastroenterology;  Laterality: N/A;  . HERNIA REPAIR  1994  . ORIF ANKLE FRACTURE Right 10/29/2015   Procedure: OPEN REDUCTION INTERNAL FIXATION (ORIF) ANKLE FRACTURE;  Surgeon: Thornton Park, MD;  Location: [facility] ORS;  Service: Orthopedics;  Laterality: Right;  . TEE WITHOUT CARDIOVERSION N/A 10/22/2018   Procedure: TRANSESOPHAGEAL ECHOCARDIOGRAM (TEE);  Surgeon: Nelva Bush, MD;  Location: [facility] ORS;  Service: Cardiovascular;  Laterality: N/A;  . VAGINAL DELIVERY     x4    Prior to Admission medications   Medication Sig Start Date End Date Taking? Authorizing Provider  acetaminophen (TYLENOL) 325 MG tablet Take 650 mg by mouth every 6 (six) hours as needed for mild pain or moderate pain.    Yes [provider]  amiodarone (PACERONE) 200 MG tablet TAKE ONE TABLET BY MOUTH ONCE DAILY Patient taking differently: Take 200 mg by mouth daily.  05/01/19  Yes [name], MD  carvedilol (COREG) 3.125 MG tablet Take 1 tablet (3.125 mg total) by mouth 2 (two) times daily. Hold for heart rate less than 60 06/19/19 06/18/20 Yes Ojie, Jude, MD  [employer] VITAMIN B12 1000 MCG tablet TAKE 1 TABLET BY MOUTH EVERY DAY Patient taking differently: Take 1,000 mcg by mouth daily.  03/28/19  Yes [name], MD  cyanocobalamin (,VITAMIN B-12,) 1000 MCG/ML injection Inject 1,000 mcg into the muscle every 30 (thirty) days.   Yes [provider]  FEROSUL 325 (65 Fe) MG tablet TAKE 1 TABLET BY MOUTH EVERY DAY FOR SUPPLEMENT Patient taking differently: Take 325 mg by mouth daily.  05/09/19  Yes [name], MD  furosemide (LASIX) 40 MG tablet Take 1 tablet (40 mg total) by mouth daily. 06/19/19 06/18/20 Yes  Ojie, Jude, MD  Insulin Detemir (LEVEMIR FLEXTOUCH) 100 UNIT/ML Pen Inject 10 Units into the skin daily. 06/19/19 07/19/19 Yes Ojie, Jude, MD  levothyroxine (SYNTHROID) 100 MCG tablet TAKE 1 TABLET BY MOUTH 30-60 MINUTES BEFORE BREAKFAST ON AN EMPTY STOMACH FOR THYROID REPLACEMENT Patient taking differently: Take 100 mcg by mouth daily before breakfast.  05/07/19  Yes [name], MD  meclizine (ANTIVERT) 25 MG tablet Take 25 mg by mouth daily as needed for dizziness.   Yes [provider]  metFORMIN (GLUCOPHAGE) 1000 MG tablet Take 1 tablet (1,000 mg total) by mouth 2 (two) times daily with a meal. TAKE 1 TABLET TWICE DAILY  WITH  A  MEAL 04/25/19  Yes [name], MD  ondansetron (ZOFRAN) 4 MG tablet Take 4 mg by mouth every 8 (eight) hours as needed for nausea. 12/04/18  Yes [provider]  pantoprazole (PROTONIX) 40 MG tablet Take 1 tablet (40 mg total) by mouth daily. 06/19/19 07/19/19 Yes Ojie, Jude, MD  polyethylene glycol (MIRALAX / GLYCOLAX) packet Take 17 g by mouth daily as needed for moderate constipation.    Yes [provider]  potassium chloride (K-DUR) 10 MEQ tablet TAKE 1 TABLET BY MOUTH EVERY DAY FOR SUPPLEMENT Patient taking differently: Take  10 mEq by mouth daily.  05/20/19  Yes [name], MD  simvastatin (ZOCOR) 20 MG tablet TAKE 1 TABLET BY MOUTH EACH NIGHT AT BEDTIME FOR CHOLESTEROL Patient taking differently: Take 20 mg by mouth at bedtime.  04/14/19  Yes [name], MD    Allergies Oxycodone  Family History  Problem Relation Age of Onset  . Heart disease Mother 77  . Heart attack Mother 27  . Cancer Father        colon  . Diabetes Sister   . Hypertension Sister   . Cancer Brother        kidney  . Diabetes Brother   . Cancer Daughter        ovarian  . Non-Hodgkin's lymphoma Son     Social History Social History   Tobacco Use  . Smoking status: Never Smoker  . Smokeless tobacco: Never Used  Substance Use  Topics  . Alcohol use: No  . Drug use: No    Review of Systems  Constitutional: No fever/chills.  Positive for generalized weakness. Eyes: No visual changes. ENT: No sore throat. Cardiovascular: Denies chest pain. Respiratory: Denies shortness of breath. Gastrointestinal: No abdominal pain.  No nausea, no vomiting.  No diarrhea.  No constipation.  Positive for dark stools. Genitourinary: Negative for dysuria. Musculoskeletal: Negative for back pain. Skin: Negative for rash. Neurological: Negative for headaches, focal weakness or numbness.  ____________________________________________   PHYSICAL EXAM:  VITAL SIGNS: ED Triage Vitals  Enc Vitals Group     BP 06/23/19 1331 (!) 138/53     Pulse Rate 06/23/19 1331 63     Resp 06/23/19 1331 16     Temp 06/23/19 1331 98 F (36.7 C)     Temp src --      SpO2 06/23/19 1331 97 %     Weight 06/23/19 1335 138 lb 10.7 oz (62.9 kg)     Height 06/23/19 1335 5\' 1"  (1.549 m)     Head Circumference --      Peak Flow --      Pain Score 06/23/19 1335 0     Pain Loc --      Pain Edu? --      Excl. in [place]? --     Constitutional: Alert and oriented. Eyes: Conjunctivae are normal. Head: Atraumatic. Nose: No congestion/rhinnorhea. Mouth/Throat: Mucous membranes are moist. Neck: Normal ROM Cardiovascular: Normal rate, regular rhythm. Grossly normal heart sounds. Respiratory: Normal respiratory effort.  No retractions. Lungs CTAB. Gastrointestinal: Soft and nontender. No distention. Genitourinary: deferred Musculoskeletal: No lower extremity tenderness nor edema. Neurologic:  Normal speech and language. No gross focal neurologic deficits are appreciated. Skin:  Skin is warm, dry and intact. No rash noted. Psychiatric: Mood and affect are normal. Speech and behavior are normal.  ____________________________________________   LABS (all labs ordered are listed, but only abnormal results are displayed)  Labs Reviewed  CBC WITH  DIFFERENTIAL/PLATELET - Abnormal; Notable for the following components:      Result Value   RBC 3.30 (*)    Hemoglobin 9.5 (*)    HCT 31.5 (*)    RDW 17.7 (*)    All other components within normal limits  COMPREHENSIVE METABOLIC PANEL - Abnormal; Notable for the following components:   Glucose, Bld 133 (*)    Creatinine, Ser 1.86 (*)    Calcium 8.6 (*)    Total Protein 6.3 (*)    Albumin 3.4 (*)    GFR calc non Af Amer 24 (*)  GFR calc Af Amer 28 (*)    All other components within normal limits  SARS CORONAVIRUS 2 (TAT 6-24 HRS)  URINALYSIS, COMPLETE (UACMP) WITH MICROSCOPIC  TROPONIN I (HIGH SENSITIVITY)  TROPONIN I (HIGH SENSITIVITY)   ____________________________________________  EKG  ED ECG REPORT I, Blake Divine, the attending physician, personally viewed and interpreted this ECG.   Date: 06/23/2019  EKG Time: 14:07  Rate: 63  Rhythm: normal sinus rhythm  Axis: Normal  Intervals:none  ST&T Change: T wave inversions laterally    PROCEDURES  Procedure(s) performed (including Critical Care):  Procedures   ____________________________________________   INITIAL IMPRESSION / ASSESSMENT AND PLAN / ED COURSE       85 year old female presenting to the ED for increasing generalized weakness overnight.  She reported some dark black stools, however her H&H is stable compared to prior, doubt significant GI bleeding.  EKG without acute ischemic changes and troponin within normal limits, doubt ACS.  Labs otherwise significant for AKI, will hydrate with IV fluids.  Patient continues to be very weak and unable to walk.  In discussion with family, they prefer that she be admitted for hydration and observation.  Case discussed with hospitalist, who accepts patient for admission.      ____________________________________________   FINAL CLINICAL IMPRESSION(S) / ED DIAGNOSES  Final diagnoses:  AKI (acute kidney injury) ([place])  Generalized weakness     ED  Discharge Orders    None       Note:  This document was prepared using Dragon voice recognition software and may include unintentional dictation errors.   Blake Divine, MD 06/23/19 [phone]

## 2019-06-23 NOTE — ED Notes (Signed)
Resumed care from [employer].  Pt alert. Pt waiting on admission bed  Sinus on monitor.  Pt alert and talking  Iv fluids infusing.

## 2019-06-23 NOTE — Telephone Encounter (Signed)
Third attempt made to contact patient for Transitional care management.

## 2019-06-23 NOTE — ED Notes (Signed)
Floor unable to take report.

## 2019-06-23 NOTE — ED Triage Notes (Signed)
Pt arrival via [facility] from [facility] c/o weakness. Pt was admitted last week with weakness and according to family was diagnosed with anemia and received blood.   Pt began acting similarly this morning with the weakness and fatigue. Pt pale upon assessment and states she's sick to her stomach. Pt denies fevers and chest pain.

## 2019-06-23 NOTE — H&P (Signed)
[place] [facility] [place] NAME: Diane Mullins    MR#:  [id number]  DATE OF BIRTH:  [birth year]  DATE OF ADMISSION:  06/23/2019  PRIMARY CARE PHYSICIAN: [name], MD   REQUESTING/REFERRING PHYSICIAN: Dr. Charna Archer  CHIEF COMPLAINT:   Chief Complaint  Patient presents with  . Weakness    HISTORY OF PRESENT ILLNESS:  Diane Mullins  is a 85 y.o. female with a known history of atrial fibrillation, benign positional vertigo, chronic diastolic CHF, diabetes, history of previous CVA, pulmonary hypertension, recent admission for GI bleed with a negative work-up and was given blood transfusions and IV iron now returns back to the hospital due to weakness.  Patient herself is very hard of hearing and therefore most of the history obtained from the ER physician and from the chart.  I attempted to call the patient's daughter but did not get a response.  Patient says she has been feeling increasingly weak over the past few days and her family is also concerned that she is got a slurred speech.  He denies any chest pains, shortness of breath, nausea, vomiting, abdominal pain, fever chills cough any sick contacts or any recent travel history.  Patient presents to the ER and on blood work was noted to be in mild acute kidney injury but otherwise no other acute abnormalities.  Her hemoglobin remained stable.  Hospitalist services were contacted for admission given her profound generalized weakness.  PAST MEDICAL HISTORY:   Past Medical History:  Diagnosis Date  . Atrial fibrillation ([place])   . Benign paroxysmal positional vertigo 07/17/2014  . Chronic diastolic CHF (congestive heart failure) ([place])    a. echo 10/19: EF of 60-65%, no RWMA, Gr2DD, calcified mitral annulus with mild MR, mildly to moderately dilated left atrium, moderate TR, PASP 52 mmHg  . Diabetes mellitus   . H/O: rheumatic fever   . Hyperlipidemia   . Hypertension   . Pulmonary hypertension ([place])    . Stroke [facility])    TIA Jan. 1st  . Teratoma of left ovary   . Thyroid disease     PAST SURGICAL HISTORY:   Past Surgical History:  Procedure Laterality Date  . ABDOMINAL HYSTERECTOMY  1973   menorrhagia  . CARDIOVERSION N/A 10/22/2018   Procedure: CARDIOVERSION;  Surgeon: Nelva Bush, MD;  Location: [facility] ORS;  Service: Cardiovascular;  Laterality: N/A;  . CARDIOVERSION N/A 11/26/2018   Procedure: CARDIOVERSION (CATH LAB);  Surgeon: Minna Merritts, MD;  Location: [facility] ORS;  Service: Cardiovascular;  Laterality: N/A;  . COLONOSCOPY WITH PROPOFOL N/A 12/16/2018   Procedure: COLONOSCOPY WITH PROPOFOL;  Surgeon: Lin Landsman, MD;  Location: [facility] ENDOSCOPY;  Service: Gastroenterology;  Laterality: N/A;  . ENTEROSCOPY N/A 12/16/2018   Procedure: Push ENTEROSCOPY;  Surgeon: Lin Landsman, MD;  Location: [facility] ENDOSCOPY;  Service: Gastroenterology;  Laterality: N/A;  . ESOPHAGOGASTRODUODENOSCOPY N/A 06/18/2019   Procedure: ESOPHAGOGASTRODUODENOSCOPY (EGD);  Surgeon: Lin Landsman, MD;  Location: [facility] ENDOSCOPY;  Service: Gastroenterology;  Laterality: N/A;  . ESOPHAGOGASTRODUODENOSCOPY (EGD) WITH PROPOFOL N/A 12/14/2018   Procedure: ESOPHAGOGASTRODUODENOSCOPY (EGD) WITH PROPOFOL;  Surgeon: Lucilla Lame, MD;  Location: [facility] ENDOSCOPY;  Service: Endoscopy;  Laterality: N/A;  . GIVENS CAPSULE STUDY N/A 06/18/2019   Procedure: GIVENS CAPSULE STUDY;  Surgeon: Lin Landsman, MD;  Location: [facility] ENDOSCOPY;  Service: Gastroenterology;  Laterality: N/A;  . HERNIA REPAIR  1994  . ORIF ANKLE FRACTURE Right 10/29/2015   Procedure: OPEN REDUCTION INTERNAL FIXATION (ORIF)  ANKLE FRACTURE;  Surgeon: Thornton Park, MD;  Location: [facility] ORS;  Service: Orthopedics;  Laterality: Right;  . TEE WITHOUT CARDIOVERSION N/A 10/22/2018   Procedure: TRANSESOPHAGEAL ECHOCARDIOGRAM (TEE);  Surgeon: Nelva Bush, MD;  Location: [facility] ORS;  Service: Cardiovascular;  Laterality: N/A;  . VAGINAL  DELIVERY     x4    SOCIAL HISTORY:   Social History   Tobacco Use  . Smoking status: Never Smoker  . Smokeless tobacco: Never Used  Substance Use Topics  . Alcohol use: No    FAMILY HISTORY:   Family History  Problem Relation Age of Onset  . Heart disease Mother 64  . Heart attack Mother 2  . Cancer Father        colon  . Diabetes Sister   . Hypertension Sister   . Cancer Brother        kidney  . Diabetes Brother   . Cancer Daughter        ovarian  . Non-Hodgkin's lymphoma Son     DRUG ALLERGIES:   Allergies  Allergen Reactions  . Oxycodone     Other reaction(s): Confusion Patient's daughter reported confusion/sensitivity to oxycodone.     REVIEW OF SYSTEMS:   Review of Systems  Constitutional: Negative for fever and weight loss.  HENT: Negative for congestion, nosebleeds and tinnitus.   Eyes: Negative for blurred vision, double vision and redness.  Respiratory: Negative for cough, hemoptysis and shortness of breath.   Cardiovascular: Negative for chest pain, orthopnea, leg swelling and PND.  Gastrointestinal: Negative for abdominal pain, diarrhea, melena, nausea and vomiting.  Genitourinary: Negative for dysuria, hematuria and urgency.  Musculoskeletal: Negative for falls and joint pain.  Neurological: Positive for weakness (Generalized. ). Negative for dizziness, tingling, sensory change, focal weakness, seizures and headaches.  Endo/Heme/Allergies: Negative for polydipsia. Does not bruise/bleed easily.  Psychiatric/Behavioral: Negative for depression and memory loss. The patient is not nervous/anxious.     MEDICATIONS AT HOME:   Prior to Admission medications   Medication Sig Start Date End Date Taking? Authorizing Provider  acetaminophen (TYLENOL) 325 MG tablet Take 650 mg by mouth every 6 (six) hours as needed for mild pain or moderate pain.    Yes [provider]  amiodarone (PACERONE) 200 MG tablet TAKE ONE TABLET BY MOUTH ONCE DAILY  Patient taking differently: Take 200 mg by mouth daily.  05/01/19  Yes [name], MD  carvedilol (COREG) 3.125 MG tablet Take 1 tablet (3.125 mg total) by mouth 2 (two) times daily. Hold for heart rate less than 60 06/19/19 06/18/20 Yes Ojie, Jude, MD  [employer] VITAMIN B12 1000 MCG tablet TAKE 1 TABLET BY MOUTH EVERY DAY Patient taking differently: Take 1,000 mcg by mouth daily.  03/28/19  Yes [name], MD  cyanocobalamin (,VITAMIN B-12,) 1000 MCG/ML injection Inject 1,000 mcg into the muscle every 30 (thirty) days.   Yes [provider]  FEROSUL 325 (65 Fe) MG tablet TAKE 1 TABLET BY MOUTH EVERY DAY FOR SUPPLEMENT Patient taking differently: Take 325 mg by mouth daily.  05/09/19  Yes [name], MD  furosemide (LASIX) 40 MG tablet Take 1 tablet (40 mg total) by mouth daily. 06/19/19 06/18/20 Yes Ojie, Jude, MD  Insulin Detemir (LEVEMIR FLEXTOUCH) 100 UNIT/ML Pen Inject 10 Units into the skin daily. 06/19/19 07/19/19 Yes Ojie, Jude, MD  levothyroxine (SYNTHROID) 100 MCG tablet TAKE 1 TABLET BY MOUTH 30-60 MINUTES BEFORE BREAKFAST ON AN EMPTY STOMACH FOR THYROID REPLACEMENT Patient taking differently: Take 100  mcg by mouth daily before breakfast.  05/07/19  Yes [name], MD  meclizine (ANTIVERT) 25 MG tablet Take 25 mg by mouth daily as needed for dizziness.   Yes [provider]  metFORMIN (GLUCOPHAGE) 1000 MG tablet Take 1 tablet (1,000 mg total) by mouth 2 (two) times daily with a meal. TAKE 1 TABLET TWICE DAILY  WITH  A  MEAL 04/25/19  Yes [name], MD  ondansetron (ZOFRAN) 4 MG tablet Take 4 mg by mouth every 8 (eight) hours as needed for nausea. 12/04/18  Yes [provider]  pantoprazole (PROTONIX) 40 MG tablet Take 1 tablet (40 mg total) by mouth daily. 06/19/19 07/19/19 Yes Ojie, Jude, MD  polyethylene glycol (MIRALAX / GLYCOLAX) packet Take 17 g by mouth daily as needed for moderate constipation.    Yes [provider]   potassium chloride (K-DUR) 10 MEQ tablet TAKE 1 TABLET BY MOUTH EVERY DAY FOR SUPPLEMENT Patient taking differently: Take 10 mEq by mouth daily.  05/20/19  Yes [name], MD  simvastatin (ZOCOR) 20 MG tablet TAKE 1 TABLET BY MOUTH EACH NIGHT AT BEDTIME FOR CHOLESTEROL Patient taking differently: Take 20 mg by mouth at bedtime.  04/14/19  Yes [name], MD      VITAL SIGNS:  Blood pressure (!) 155/80, pulse 63, temperature 98 F (36.7 C), resp. rate 18, height 5\' 1"  (1.549 m), weight 62.9 kg, SpO2 100 %.  PHYSICAL EXAMINATION:  Physical Exam  GENERAL:  85 y.o.-year-old patient lying in the bed in NAD.   EYES: Pupils equal, round, reactive to light and accommodation. No scleral icterus. Extraocular muscles intact.  HEENT: Head atraumatic, normocephalic. Oropharynx and nasopharynx clear. No oropharyngeal erythema, moist oral mucosa  NECK:  Supple, no jugular venous distention. No thyroid enlargement, no tenderness.  LUNGS: Normal breath sounds bilaterally, no wheezing, rales, rhonchi. No use of accessory muscles of respiration.  CARDIOVASCULAR: S1, S2 RRR. No murmurs, rubs, gallops, clicks.  ABDOMEN: Soft, nontender, nondistended. Bowel sounds present. No organomegaly or mass.  EXTREMITIES: No pedal edema, cyanosis, or clubbing. + 2 pedal & radial pulses b/l.   NEUROLOGIC: Cranial nerves II through XII are intact. No focal Motor or sensory deficits appreciated b/l. Globally weak.  PSYCHIATRIC: The patient is alert and oriented x 3.  SKIN: No obvious rash, lesion, or ulcer.   LABORATORY PANEL:   CBC Recent Labs  Lab 06/23/19 1358  WBC 7.6  HGB 9.5*  HCT 31.5*  PLT 366   ------------------------------------------------------------------------------------------------------------------  Chemistries  Recent Labs  Lab 06/17/19 0530 06/23/19 1358  NA 141 137  K 3.9 4.5  CL 104 99  CO2 27 27  GLUCOSE 108* 133*  BUN 16 17  CREATININE 1.19* 1.86*  CALCIUM 8.4*  8.6*  MG 1.8  --   AST  --  19  ALT  --  16  ALKPHOS  --  85  BILITOT  --  0.4   ------------------------------------------------------------------------------------------------------------------  Cardiac Enzymes No results for input(s): TROPONINI in the last 168 hours. ------------------------------------------------------------------------------------------------------------------  RADIOLOGY:  Dg Chest 2 View  Result Date: 06/23/2019 CLINICAL DATA:  Weakness and altered mental status. EXAM: CHEST - 2 VIEW COMPARISON:  Single-view of the chest 06/16/2019 and 10/17/2018. PA and lateral chest 07/09/2018. FINDINGS: There is cardiomegaly. Lungs are clear. Atherosclerosis noted. No pneumothorax or pleural fluid. No acute or focal bony abnormality. IMPRESSION: No acute disease. Cardiomegaly. Atherosclerosis. Electronically Signed   By: Inge Rise M.D.   On: 06/23/2019 14:28  IMPRESSION AND PLAN:   85 y.o. female with a known history of atrial fibrillation, benign positional vertigo, chronic diastolic CHF, diabetes, history of previous CVA, pulmonary hypertension, recent admission for GI bleed with a negative work-up and was given blood transfusions and IV iron now returns back to the hospital due to weakness.  1.  Generalized weakness-etiology unclear but suspected to be secondary to mild acute kidney injury, deconditioning and her chronic anemia. - We will gently hydrate her with IV fluids, no acute need for transfusion presently.  We will get physical therapy consult to assess mobility.  Patient already is coming from a group home.  She may need home health services upon discharge.  2.  Chronic anemia- hemoglobin currently stable.  Patient was recently hospitalized for GI bleed and was transfused and given IV iron.  Continue her oral iron supplements for now.  No acute need for transfusion presently.  3.  Essential hypertension-continue carvedilol.  4.  History of chronic  diastolic CHF-clinically patient is not in congestive heart failure. -Hold patient's Lasix given the acute kidney injury. - Continue carvedilol.  5.  Acute kidney injury-this is mild.  Patient's baseline creatinines are 1.1-1.2 and patient presented to the hospital the creatinine 1.8. -Hold patient's Lasix, gently hydrate the patient with IV fluids.  Follow BUN/creatinine.  Hold metformin.  6.  Hypothyroidism-continue Synthroid.  7.  History of chronic atrial fibrillation-rate controlled.  Continue amiodarone. -Patient is not on anticoagulation given her GI bleed.  8.  GERD-continue Protonix.  9.  Hyperlipidemia-continue simvastatin.    All the records are reviewed and case discussed with ED provider. Management plans discussed with the patient, family and they are in agreement.  CODE STATUS: DNR  TOTAL TIME TAKING CARE OF THIS PATIENT: 40 minutes.    Henreitta Leber M.D on 06/23/2019 at 3:50 PM  Between 7am to 6pm - Pager - [phone]  After 6pm go to [email or web address] - password [email or web address] [facility]  [place]  Office  [phone]  CC: Primary care physician; [name], MD

## 2019-06-24 ENCOUNTER — Encounter: Payer: Self-pay | Admitting: Gastroenterology

## 2019-06-24 DIAGNOSIS — I482 Chronic atrial fibrillation, unspecified: Secondary | ICD-10-CM | POA: Diagnosis present

## 2019-06-24 DIAGNOSIS — Z807 Family history of other malignant neoplasms of lymphoid, hematopoietic and related tissues: Secondary | ICD-10-CM | POA: Diagnosis not present

## 2019-06-24 DIAGNOSIS — E860 Dehydration: Secondary | ICD-10-CM | POA: Diagnosis present

## 2019-06-24 DIAGNOSIS — K219 Gastro-esophageal reflux disease without esophagitis: Secondary | ICD-10-CM | POA: Diagnosis present

## 2019-06-24 DIAGNOSIS — E039 Hypothyroidism, unspecified: Secondary | ICD-10-CM | POA: Diagnosis present

## 2019-06-24 DIAGNOSIS — Z66 Do not resuscitate: Secondary | ICD-10-CM | POA: Diagnosis present

## 2019-06-24 DIAGNOSIS — D649 Anemia, unspecified: Secondary | ICD-10-CM | POA: Diagnosis present

## 2019-06-24 DIAGNOSIS — Z8673 Personal history of transient ischemic attack (TIA), and cerebral infarction without residual deficits: Secondary | ICD-10-CM | POA: Diagnosis not present

## 2019-06-24 DIAGNOSIS — Z800 Family history of malignant neoplasm of digestive organs: Secondary | ICD-10-CM | POA: Diagnosis not present

## 2019-06-24 DIAGNOSIS — R531 Weakness: Secondary | ICD-10-CM | POA: Diagnosis present

## 2019-06-24 DIAGNOSIS — Z885 Allergy status to narcotic agent status: Secondary | ICD-10-CM | POA: Diagnosis not present

## 2019-06-24 DIAGNOSIS — E119 Type 2 diabetes mellitus without complications: Secondary | ICD-10-CM | POA: Diagnosis present

## 2019-06-24 DIAGNOSIS — N3000 Acute cystitis without hematuria: Secondary | ICD-10-CM | POA: Diagnosis present

## 2019-06-24 DIAGNOSIS — I110 Hypertensive heart disease with heart failure: Secondary | ICD-10-CM | POA: Diagnosis present

## 2019-06-24 DIAGNOSIS — I5032 Chronic diastolic (congestive) heart failure: Secondary | ICD-10-CM | POA: Diagnosis present

## 2019-06-24 DIAGNOSIS — Z794 Long term (current) use of insulin: Secondary | ICD-10-CM | POA: Diagnosis not present

## 2019-06-24 DIAGNOSIS — Z20828 Contact with and (suspected) exposure to other viral communicable diseases: Secondary | ICD-10-CM | POA: Diagnosis present

## 2019-06-24 DIAGNOSIS — Z833 Family history of diabetes mellitus: Secondary | ICD-10-CM | POA: Diagnosis not present

## 2019-06-24 DIAGNOSIS — N179 Acute kidney failure, unspecified: Secondary | ICD-10-CM | POA: Diagnosis present

## 2019-06-24 DIAGNOSIS — E785 Hyperlipidemia, unspecified: Secondary | ICD-10-CM | POA: Diagnosis present

## 2019-06-24 DIAGNOSIS — I272 Pulmonary hypertension, unspecified: Secondary | ICD-10-CM | POA: Diagnosis present

## 2019-06-24 DIAGNOSIS — Z8249 Family history of ischemic heart disease and other diseases of the circulatory system: Secondary | ICD-10-CM | POA: Diagnosis not present

## 2019-06-24 LAB — BASIC METABOLIC PANEL
Anion gap: 5 (ref 5–15)
BUN: 16 mg/dL (ref 8–23)
CO2: 29 mmol/L (ref 22–32)
Calcium: 8 mg/dL — ABNORMAL LOW (ref 8.9–10.3)
Chloride: 103 mmol/L (ref 98–111)
Creatinine, Ser: 1.67 mg/dL — ABNORMAL HIGH (ref 0.44–1.00)
GFR calc Af Amer: 32 mL/min — ABNORMAL LOW (ref >60)
GFR calc non Af Amer: 28 mL/min — ABNORMAL LOW (ref >60)
Glucose, Bld: 103 mg/dL — ABNORMAL HIGH (ref 70–99)
Potassium: 4 mmol/L (ref 3.5–5.1)
Sodium: 137 mmol/L (ref 135–145)

## 2019-06-24 LAB — URINALYSIS, COMPLETE (UACMP) WITH MICROSCOPIC
Bilirubin Urine: NEGATIVE
Glucose, UA: NEGATIVE mg/dL
Hgb urine dipstick: NEGATIVE
Ketones, ur: NEGATIVE mg/dL
Nitrite: NEGATIVE
Protein, ur: NEGATIVE mg/dL
Specific Gravity, Urine: 1.014 (ref 1.005–1.030)
pH: 5 (ref 5.0–8.0)

## 2019-06-24 MED ORDER — CIPROFLOXACIN HCL 500 MG PO TABS: 250.0000 mg | ORAL_TABLET | Freq: Two times a day (BID) | ORAL | Status: DC

## 2019-06-24 MED ORDER — CEPHALEXIN 250 MG PO CAPS
250.0000 mg | ORAL_CAPSULE | Freq: Three times a day (TID) | ORAL | Status: DC
  Administered 2019-06-24 – 2019-06-25 (×3): 250 mg via ORAL
  Filled 2019-06-24 (×6): qty 1

## 2019-06-24 NOTE — Evaluation (Signed)
Clinical/Bedside Swallow Evaluation Patient Details  Name: Diane Mullins MRN: [id number] Date of Birth: 1932-10-09  Today's Date: 06/24/2019 Time: SLP Start Time (ACUTE ONLY): 1110 SLP Stop Time (ACUTE ONLY): 1200 SLP Time Calculation (min) (ACUTE ONLY): 50 min  Past Medical History:  Past Medical History:  Diagnosis Date  . Atrial fibrillation ([place])   . Benign paroxysmal positional vertigo 07/17/2014  . Chronic diastolic CHF (congestive heart failure) ([place])    a. echo 10/19: EF of 60-65%, no RWMA, Gr2DD, calcified mitral annulus with mild MR, mildly to moderately dilated left atrium, moderate TR, PASP 52 mmHg  . Diabetes mellitus   . H/O: rheumatic fever   . Hyperlipidemia   . Hypertension   . Pulmonary hypertension ([place])   . Stroke [facility])    TIA Jan. 1st  . Teratoma of left ovary   . Thyroid disease    Past Surgical History:  Past Surgical History:  Procedure Laterality Date  . ABDOMINAL HYSTERECTOMY  1973   menorrhagia  . CARDIOVERSION N/A 10/22/2018   Procedure: CARDIOVERSION;  Surgeon: Nelva Bush, MD;  Location: [facility] ORS;  Service: Cardiovascular;  Laterality: N/A;  . CARDIOVERSION N/A 11/26/2018   Procedure: CARDIOVERSION (CATH LAB);  Surgeon: Minna Merritts, MD;  Location: [facility] ORS;  Service: Cardiovascular;  Laterality: N/A;  . COLONOSCOPY WITH PROPOFOL N/A 12/16/2018   Procedure: COLONOSCOPY WITH PROPOFOL;  Surgeon: Lin Landsman, MD;  [facility] ENDOSCOPY;  Service: Gastroenterology;  Laterality: N/A;  . ENTEROSCOPY N/A 12/16/2018   Procedure: Push ENTEROSCOPY;  Surgeon: Lin Landsman, MD;  Location: [facility] ENDOSCOPY;  Service: Gastroenterology;  Laterality: N/A;  . ESOPHAGOGASTRODUODENOSCOPY N/A 06/18/2019   Procedure: ESOPHAGOGASTRODUODENOSCOPY (EGD);  Surgeon: Lin Landsman, MD;  Location: [facility] ENDOSCOPY;  Service: Gastroenterology;  Laterality: N/A;  . ESOPHAGOGASTRODUODENOSCOPY (EGD) WITH PROPOFOL N/A 12/14/2018   Procedure:  ESOPHAGOGASTRODUODENOSCOPY (EGD) WITH PROPOFOL;  Surgeon: Lucilla Lame, MD;  Location: [facility] ENDOSCOPY;  Service: Endoscopy;  Laterality: N/A;  . GIVENS CAPSULE STUDY N/A 06/18/2019   Procedure: GIVENS CAPSULE STUDY;  Surgeon: Lin Landsman, MD;  Location: [facility] ENDOSCOPY;  Service: Gastroenterology;  Laterality: N/A;  . HERNIA REPAIR  1994  . ORIF ANKLE FRACTURE Right 10/29/2015   Procedure: OPEN REDUCTION INTERNAL FIXATION (ORIF) ANKLE FRACTURE;  Surgeon: Thornton Park, MD;  Location: [facility] ORS;  Service: Orthopedics;  Laterality: Right;  . TEE WITHOUT CARDIOVERSION N/A 10/22/2018   Procedure: TRANSESOPHAGEAL ECHOCARDIOGRAM (TEE);  Surgeon: Nelva Bush, MD;  Location: [facility] ORS;  Service: Cardiovascular;  Laterality: N/A;  . VAGINAL DELIVERY     x4   HPI:  Pt is a 85 y.o. female with past medical history of Multiple medical issues including Stroke, TIAs, atrial fibrillation, CHF, hypertension, HOH w/ aids, diabetes, obesity, Gait disturbance, fatigue, GI bleed, and Coronary artery calcification seen on CAT scan 2019 who presents to the ED for generalized weakness.  Patient was recently discharged from the hospital for GI bleed 4 days prior and states she was doing well at that time.  She states she started feeling very weak overnight last night into this morning.  She states weakness affects her entire body and she denies any associated vision changes, speech changes, or numbness.  She states she has continued to notice some dark black stools and has been feeling nauseous.  She denies any fevers, chills, cough, chest pain, shortness of breath, dysuria, or hematuria.  NSG reported Dtr stated to her that pt's speech articulation fluctuates and that "sometimes it's better than at other times".  Pt denied any current deficits/changes when asked at this evaluation. Would recommend f/u w/ Neurology for full assessment; pt could f/u w/ ST services for Dysarthria if desired post discharge.   Assessment  / Plan / Recommendation Clinical Impression  Pt appears to present w/ adequate oropharyngeal phase swallowing function w/ reduced risk for aspiration when following general aspiration precautions (No Straws, Pills in a puree/ice cream). Pt exhibited No overt s/s of aspiration during/post po trials; no decline in vocal quality or respiratory status noted. Oral phase was [facility] for bolus management/mastication; timely A-P transfer noted and full oral clearing achieved b/t trials. OM exam appeared [facility] w/ appropriate lingual strength and ROM. Pt's speech is c/b slight-min Dysarthria which has been noted as baseline for her per Daughter's report to NSG(unsure if related to previous TIAs/CVA?). Pt fed self w/ setup support/positioning in bed.  Recommend a mech soft diet (for easier self feeding of meats) w/ Thin liquids -- NO Straws, Cup Only. Rec. general aspiration precautions including Pills Whole in ice cream/puree for safer swallowing. Rec. setup support at meals.  SLP Visit Diagnosis: Dysphagia, unspecified (R13.10)    Aspiration Risk  (reduced following general precautions)    Diet Recommendation  Mech Soft/Regular diet (meats cut, moistened); Thin liquids. General aspiration precautions including NO Straws -- Cup drinking only.   Medication Administration: Whole meds with puree(for safer swallowing)    Other  Recommendations Recommended Consults: (Dietician f/u) Oral Care Recommendations: Oral care BID;Patient independent with oral care(support) Other Recommendations: (n/a)   Follow up Recommendations None      Frequency and Duration (n/a)  (n/a)       Prognosis Prognosis for Safe Diet Advancement: Good      Swallow Study   General Date of Onset: 06/23/19 HPI: Pt is a 85 y.o. female with past medical history of Multiple medical issues including Stroke, TIAs, atrial fibrillation, CHF, hypertension, HOH w/ aids, diabetes, obesity, Gait disturbance, fatigue, GI bleed, and Coronary artery  calcification seen on CAT scan 2019 who presents to the ED for generalized weakness.  Patient was recently discharged from the hospital for GI bleed 4 days prior and states she was doing well at that time.  She states she started feeling very weak overnight last night into this morning.  She states weakness affects her entire body and she denies any associated vision changes, speech changes, or numbness.  She states she has continued to notice some dark black stools and has been feeling nauseous.  She denies any fevers, chills, cough, chest pain, shortness of breath, dysuria, or hematuria.  NSG reported Dtr stated to her that pt's speech articulation fluctuates and that "sometimes it's better than at other times".  Pt denied any current deficits/changes when asked at this evaluation. Would recommend f/u w/ Neurology for full assessment; pt could f/u w/ ST services for Dysarthria if desired post discharge. Type of Study: Bedside Swallow Evaluation Previous Swallow Assessment: none Diet Prior to this Study: Regular;Thin liquids Temperature Spikes Noted: No(wbc 7.6) Respiratory Status: Room air History of Recent Intubation: No Behavior/Cognition: Alert;Cooperative;Pleasant mood Oral Cavity Assessment: Within Functional Limits Oral Care Completed by SLP: Recent completion by staff Oral Cavity - Dentition: Adequate natural dentition Vision: Functional for self-feeding Self-Feeding Abilities: Able to feed self;Needs assist;Needs set up Patient Positioning: Upright in bed(needed positioning) Baseline Vocal Quality: Normal(slight Dysarthria) Volitional Cough: Strong Volitional Swallow: Able to elicit    Oral/Motor/Sensory Function Overall Oral Motor/Sensory Function: Within functional limits(lingual strength - strong(anterior/posterior))   Ice Chips  Ice chips: Within functional limits Presentation: Spoon(fed; 2 trials)   Thin Liquid Thin Liquid: Within functional limits Presentation: Cup;Self Fed(~4+  ozs total) Other Comments: NO straw used    Nectar Thick Nectar Thick Liquid: Not tested   Honey Thick Honey Thick Liquid: Not tested   Puree Puree: Within functional limits Presentation: Spoon;Self Fed(5 trials)   Solid     Solid: Within functional limits Presentation: Self Fed;Spoon(5 trials) Other Comments: helped to prep the food       Orinda Kenner, MS, CCC-SLP Yoan Sallade 06/24/2019,4:00 PM

## 2019-06-24 NOTE — Evaluation (Signed)
Physical Therapy Evaluation Patient Details Name: Diane Mullins MRN: [id number] DOB: [birth year] Today's Date: 06/24/2019   History of Present Illness  Per MD: Pt is a 85 y.o. female who returns back to the hospital due to weakness.  Patient is HOH. Patient says she has been feeling increasingly weak over the past few days and her family is also concerned that she has slurred speech. Patient presents to the ER and on blood work was noted to be in mild acute kidney injury but otherwise no other acute abnormalities. PMH includes: atrial fibrillation, benign positional vertigo, chronic diastolic CHF, diabetes, history of previous CVA, pulmonary hypertension, recent admission for GI bleed with a negative work-up and was given blood transfusions and IV iron.  Clinical Impression  Pt presented with deficits in strength, transfers, mobility, gait, and balance. Pt able to answer a few close-ended questions, confirming history from prior PT evaluation. Pt completed mobility and required min A to come to EOB. Pt stood to complete a standing pivot transfer to [facility] with close CGA for safety. Session was limited by pt requiring a BM. Pt will benefit from HHPT services upon discharge to safely address above deficits for decreased caregiver assistance and eventual return to PLOF.      Follow Up Recommendations Home health PT;Supervision for mobility/OOB    Equipment Recommendations  Other (comment)(Per prior admission: Pt has RW and WC at home)    Recommendations for Other Services       Precautions / Restrictions Precautions Precautions: Fall Restrictions Weight Bearing Restrictions: No      Mobility  Bed Mobility Overal bed mobility: Needs Assistance Bed Mobility: Supine to Sit;Rolling Rolling: Min assist   Supine to sit: Min assist     General bed mobility comments: Pt needed to get up for a BM and grabbed PT's hand instead of EOB  Transfers Overall transfer level: Needs  assistance Equipment used: None Transfers: Squat Pivot Transfers     Squat pivot transfers: Min guard     General transfer comment: Transfered from EOB to [facility]  Ambulation/Gait             General Gait Details: Deferred until next session. Session ended early due to pt's BM.  Stairs            Wheelchair Mobility    Modified Rankin (Stroke Patients Only)       Balance Overall balance assessment: Needs assistance Sitting-balance support: Feet supported Sitting balance-Leahy Scale: Fair     Standing balance support: Bilateral upper extremity supported(Mod A to pivot transfer) Standing balance-Leahy Scale: Poor                               Pertinent Vitals/Pain Pain Assessment: No/denies pain    Home Living Family/patient expects to be discharged to:: Assisted living               Home Equipment: Walker - 4 wheels;Grab bars - tub/shower Additional Comments: Single level, no steps required.  Walk in shower with seat; grab bars throughout apartment. handicapped height toilet.    Prior Function Level of Independence: Needs assistance   Gait / Transfers Assistance Needed: Pt needs physical assist at baseline to transfer to [facility] as well as to transfer in/out of  bed, off commode  ADL's / Homemaking Assistance Needed: assistance needed for bathing/dressing  Comments: Per chart review/last admission: Pt has not ambulated in several months  Hand Dominance        Extremity/Trunk Assessment   Upper Extremity Assessment Upper Extremity Assessment: Generalized weakness    Lower Extremity Assessment Lower Extremity Assessment: Generalized weakness    Cervical / Trunk Assessment Cervical / Trunk Assessment: Kyphotic  Communication   Communication: HOH  Cognition Arousal/Alertness: Awake/alert Behavior During Therapy: Flat affect Overall Cognitive Status: Difficult to assess                                 General  Comments: Pt HOH, poor historian, no family/caregiver to determine baseline.      General Comments      Exercises Total Joint Exercises Ankle Circles/Pumps: AROM;Strengthening;Both;10 reps Other Exercises Other Exercises: All other assessment and exercises limited by pt BM   Assessment/Plan    PT Assessment Patient needs continued PT services  PT Problem List Decreased strength;Decreased activity tolerance;Decreased balance;Decreased mobility;Decreased knowledge of use of DME       PT Treatment Interventions DME instruction;Therapeutic exercise;Gait training;Balance training;Neuromuscular re-education;Functional mobility training;Therapeutic activities;Patient/family education    PT Goals (Current goals can be found in the Care Plan section)  Acute Rehab PT Goals Patient Stated Goal: To go home PT Goal Formulation: With patient Time For Goal Achievement: 07/07/19 Potential to Achieve Goals: Fair    Frequency Min 2X/week   Barriers to discharge        Co-evaluation               AM-PAC PT "6 Clicks" Mobility  Outcome Measure Help needed turning from your back to your side while in a flat bed without using bedrails?: A Little Help needed moving from lying on your back to sitting on the side of a flat bed without using bedrails?: A Lot Help needed moving to and from a bed to a chair (including a wheelchair)?: A Lot Help needed standing up from a chair using your arms (e.g., wheelchair or bedside chair)?: A Lot Help needed to walk in hospital room?: Total Help needed climbing 3-5 steps with a railing? : Total 6 Click Score: 11    End of Session Equipment Utilized During Treatment: Gait belt Activity Tolerance: Patient tolerated treatment well Patient left: in chair;with nursing/sitter in room;Other (comment);with call bell/phone within reach(Pt on BSC, NT in room to clean) Nurse Communication: Mobility status PT Visit Diagnosis: Muscle weakness (generalized)  (M62.81);Unsteadiness on feet (R26.81);Difficulty in walking, not elsewhere classified (R26.2)    Time: 4917-9150 PT Time Calculation (min) (ACUTE ONLY): 17 min   Charges:             Juanda Crumble "Gus" Jeannette Corpus, SPT  06/24/19, 4:17 PM

## 2019-06-24 NOTE — Care Management Obs Status (Signed)
[place] NOTIFICATION   Patient Details  Name: Diane Mullins MRN: [id number] Date of Birth: July 16, 1933   Medicare Observation Status Notification Given:  Yes    Candie Chroman, LCSW 06/24/2019, 2:46 PM

## 2019-06-24 NOTE — TOC Initial Note (Signed)
Transition of Care [facility]) - Initial/Assessment Note    Patient Details  Name: Diane Mullins MRN: [id number] Date of Birth: 1932/10/26  [facility]) CM/SW Contact:    Candie Chroman, LCSW Phone Number: 06/24/2019, 2:47 PM  Clinical Narrative:  CSW met with patient. No supports at bedside. CSW introduced role and explained that discharge planning would be discussed. Patient confirmed she is from [facility]. CSW spoke with Horris Latino at [facility] and notified her of HHPT recommendation. She said to just put "PT eval and treat" on the [facility] and they will evaluate her there to determine her needs. No further concerns. CSW encouraged patient to contact CSW as needed. CSW will continue to follow patient for support and facilitate return to ALF once medically stable.                Expected Discharge Plan: Assisted Living     Patient Goals and CMS Choice        Expected Discharge Plan and Services Expected Discharge Plan: Assisted Living                                              Prior Living Arrangements/Services   Lives with:: Facility Resident Patient language and need for interpreter reviewed:: Yes Do you feel safe going back to the place where you live?: Yes      Need for Family Participation in Patient Care: Yes (Comment) Care giver support system in place?: Yes (comment) Current home services: DME Criminal Activity/Legal Involvement Pertinent to Current Situation/Hospitalization: No - Comment as needed  Activities of Daily Living Home Assistive Devices/Equipment: Wheelchair ADL Screening (condition at time of admission) Patient's cognitive ability adequate to safely complete daily activities?: Yes Is the patient deaf or have difficulty hearing?: Yes Does the patient have difficulty seeing, even when wearing glasses/contacts?: No Does the patient have difficulty concentrating, remembering, or making decisions?: No Patient able to express  need for assistance with ADLs?: Yes Does the patient have difficulty dressing or bathing?: Yes Independently performs ADLs?: No Communication: Independent Dressing (OT): Needs assistance Is this a change from baseline?: Pre-admission baseline Grooming: Needs assistance Is this a change from baseline?: Pre-admission baseline Feeding: Independent Bathing: Needs assistance Is this a change from baseline?: Pre-admission baseline Toileting: Needs assistance Is this a change from baseline?: Pre-admission baseline In/Out Bed: Needs assistance Is this a change from baseline?: Pre-admission baseline Walks in Home: Needs assistance Is this a change from baseline?: Pre-admission baseline Does the patient have difficulty walking or climbing stairs?: Yes Weakness of Legs: Both Weakness of Arms/Hands: None  Permission Sought/Granted Permission sought to share information with : Chartered certified accountant granted to share information with : Yes, Verbal Permission Granted     Permission granted to share info w AGENCY: [facility]        Emotional Assessment Appearance:: Appears stated age Attitude/Demeanor/Rapport: Engaged Affect (typically observed): Accepting, Appropriate, Calm Orientation: : Oriented to Self, Oriented to Place, Oriented to  Time, Oriented to Situation Alcohol / Substance Use: Never Used Psych Involvement: No (comment)  Admission diagnosis:  Generalized weakness [R53.1] AKI (acute kidney injury) ([place]) [N17.9] Patient Active Problem List   Diagnosis Date Noted  . Generalized weakness 06/23/2019  . GI bleed 06/16/2019  . B12 deficiency 04/06/2019  . Balance problem 04/06/2019  . Gastrointestinal  hemorrhage with melena   . Symptomatic anemia   . Melena 12/11/2018  . Constipation 12/10/2018  . Atrial flutter ([place]) 10/17/2018  . 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 AKI (acute kidney injury) ([place])   . Acute on chronic diastolic heart  failure ([place])   . Respiratory failure ([place]) 07/05/2018  . Coronary artery calcification seen on CAT scan 11/16/2017  . Morbid obesity ([place]) 10/30/2017  . (HFpEF) heart failure with preserved ejection fraction ([place]) 09/28/2017  . Family history of colon cancer 04/17/2017  . Closed right ankle fracture 10/28/2015  . Fatigue 10/18/2015  . TIA (transient ischemic attack) 09/18/2015  . Orthostatic hypotension 08/02/2015  . Adjustment disorder with anxious mood 03/30/2015  . Postmenopausal estrogen deficiency 06/03/2014  . DNR (do not resuscitate) discussion 03/04/2014  . Gait disturbance 05/07/2013  . Diabetes ([place]) 04/21/2013  . Shortness of breath 12/09/2012  . Hearing loss 12/09/2012  . Anxiety 01/31/2012  . Hypertension 12/22/2011  . Hyperlipidemia 12/22/2011  . Hypothyroidism 12/22/2011   PCP:  [name], MD Pharmacy:   [place], [place] - [address] [place] [address] Phone: [phone] Fax: [phone]     Social Determinants of Health (SDOH) Interventions    Readmission Risk Interventions Readmission Risk Prevention Plan 12/16/2018 12/15/2018 12/14/2018  Transportation Screening Complete Complete Complete  PCP or Specialist Appt within 5-7 Days - - -  PCP or Specialist Appt within 3-5 Days Complete Patient refused -  Home Care Screening - - -  Medication Review (RN CM) - - -  [place] or Home Care Consult Complete Complete -  Social Work Consult for [place] Planning/Counseling Complete Complete -  Palliative Care Screening Not Applicable Not Applicable -  Medication Review Press photographer) Complete Complete Not Complete  Med Review Comments - - Review upon discharge  PCP or Specialist appointment within 3-5 days of discharge - - Not Complete  PCP/Specialist Appt Not Complete comments - - Review prior to discharge for appointment with cardiology  [place] or [place] - - Complete  SW Recovery  Care/Counseling Consult - - Complete  [place] - - Complete  Some recent data might be hidden

## 2019-06-24 NOTE — Progress Notes (Signed)
[facility] [place] NAME: Phuong Hillary    MR#:  [id number]  DATE OF BIRTH:  July 30, 1933  SUBJECTIVE:  CHIEF COMPLAINT:   Chief Complaint  Patient presents with  . Weakness  Patient seen today Has generalized weakness Has some dysuria No chest pain  REVIEW OF SYSTEMS:    ROS  CONSTITUTIONAL: No documented fever. Has fatigue, weakness. No weight gain, no weight loss.  EYES: No blurry or double vision.  ENT: No tinnitus. No postnasal drip. No redness of the oropharynx.  RESPIRATORY: No cough, no wheeze, no hemoptysis. No dyspnea.  CARDIOVASCULAR: No chest pain. No orthopnea. No palpitations. No syncope.  GASTROINTESTINAL: No nausea, no vomiting or diarrhea. No abdominal pain. No melena or hematochezia.  GENITOURINARY: Has dysuria  no hematuria.  ENDOCRINE: No polyuria or nocturia. No heat or cold intolerance.  HEMATOLOGY: No anemia. No bruising. No bleeding.  INTEGUMENTARY: No rashes. No lesions.  MUSCULOSKELETAL: No arthritis. No swelling. No gout.  NEUROLOGIC: No numbness, tingling, or ataxia. No seizure-type activity.  PSYCHIATRIC: No anxiety. No insomnia. No ADD.   DRUG ALLERGIES:   Allergies  Allergen Reactions  . Oxycodone     Other reaction(s): Confusion Patient's daughter reported confusion/sensitivity to oxycodone.     VITALS:  Blood pressure (!) 128/55, pulse 70, temperature 97.7 F (36.5 C), temperature source Axillary, resp. rate 17, height 5\' 1"  (1.549 m), weight 62.9 kg, SpO2 98 %.  PHYSICAL EXAMINATION:   Physical Exam  GENERAL:  85 y.o.-year-old patient lying in the bed with no acute distress.  EYES: Pupils equal, round, reactive to light and accommodation. No scleral icterus. Extraocular muscles intact.  HEENT: Head atraumatic, normocephalic. Oropharynx and nasopharynx clear.  NECK:  Supple, no jugular venous distention. No thyroid enlargement, no tenderness.  LUNGS: Normal breath sounds bilaterally, no  wheezing, rales, rhonchi. No use of accessory muscles of respiration.  CARDIOVASCULAR: S1, S2 normal. No murmurs, rubs, or gallops.  ABDOMEN: Soft, nontender, nondistended. Bowel sounds present. No organomegaly or mass.  EXTREMITIES: No cyanosis, clubbing or edema b/l.    NEUROLOGIC: Cranial nerves II through XII are intact. No focal Motor or sensory deficits b/l.   PSYCHIATRIC: The patient is alert and oriented x 3.  SKIN: No obvious rash, lesion, or ulcer.   LABORATORY PANEL:   CBC Recent Labs  Lab 06/23/19 1358  WBC 7.6  HGB 9.5*  HCT 31.5*  PLT 366   ------------------------------------------------------------------------------------------------------------------ Chemistries  Recent Labs  Lab 06/23/19 1358 06/24/19 0358  NA 137 137  K 4.5 4.0  CL 99 103  CO2 27 29  GLUCOSE 133* 103*  BUN 17 16  CREATININE 1.86* 1.67*  CALCIUM 8.6* 8.0*  AST 19  --   ALT 16  --   ALKPHOS 85  --   BILITOT 0.4  --    ------------------------------------------------------------------------------------------------------------------  Cardiac Enzymes No results for input(s): TROPONINI in the last 168 hours. ------------------------------------------------------------------------------------------------------------------  RADIOLOGY:  Dg Chest 2 View  Result Date: 06/23/2019 CLINICAL DATA:  Weakness and altered mental status. EXAM: CHEST - 2 VIEW COMPARISON:  Single-view of the chest 06/16/2019 and 10/17/2018. PA and lateral chest 07/09/2018. FINDINGS: There is cardiomegaly. Lungs are clear. Atherosclerosis noted. No pneumothorax or pleural fluid. No acute or focal bony abnormality. IMPRESSION: No acute disease. Cardiomegaly. Atherosclerosis. Electronically Signed   By: Inge Rise M.D.   On: 06/23/2019 14:28     ASSESSMENT AND PLAN:   85 year old female patient with history of chronic atrial  fibrillation, benign positional vertigo, chronic diastolic heart failure, type 2 diabetes  mellitus, CVA, pulmonary hypertension currently under hospitalist service  -Dehydration IV fluid hydration with normal saline  -Acute cystitis Start oral ciprofloxacin antibiotic  -Chronic anemia Iron supplements and monitor hemoglobin hematocrit No need for transfusion  -Hypertension Continue Coreg  -Acute kidney injury secondary to dehydration Diuretics on hold IV fluids and monitor renal function  -Hypothyroidism Continue Synthroid Check TSH panel  -Chronic atrial fibrillation Continue amiodarone Not on anticoagulation secondary GI bleed  -GERD Continue Protonix  All the records are reviewed and case discussed with Care Management/Social Worker. Management plans discussed with the patient, family and they are in agreement.  CODE STATUS: DNR  DVT Prophylaxis: SCDs  TOTAL TIME TAKING CARE OF THIS PATIENT: 37 minutes.   POSSIBLE D/C IN 1 to 2 DAYS, DEPENDING ON CLINICAL CONDITION.  Saundra Shelling M.D on 06/24/2019 at 12:15 PM  Between 7am to 6pm - Pager - [phone]  After 6pm go to [email or web address] - password [email or web address] [facility]  Office  [phone]  CC: Primary care physician; [name], MD  Note: This dictation was prepared with Dragon dictation along with smaller phrase technology. Any transcriptional errors that result from this process are unintentional.

## 2019-06-25 LAB — THYROID PANEL WITH TSH
Free Thyroxine Index: 1.9 (ref 1.2–4.9)
T3 Uptake Ratio: 25 % (ref 24–39)
T4, Total: 7.5 ug/dL (ref 4.5–12.0)
TSH: 29.3 u[IU]/mL — ABNORMAL HIGH (ref 0.450–4.500)

## 2019-06-25 LAB — BASIC METABOLIC PANEL
Anion gap: 4 — ABNORMAL LOW (ref 5–15)
BUN: 16 mg/dL (ref 8–23)
CO2: 28 mmol/L (ref 22–32)
Calcium: 8.4 mg/dL — ABNORMAL LOW (ref 8.9–10.3)
Chloride: 109 mmol/L (ref 98–111)
Creatinine, Ser: 1.56 mg/dL — ABNORMAL HIGH (ref 0.44–1.00)
GFR calc Af Amer: 35 mL/min — ABNORMAL LOW (ref >60)
GFR calc non Af Amer: 30 mL/min — ABNORMAL LOW (ref >60)
Glucose, Bld: 84 mg/dL (ref 70–99)
Potassium: 4.2 mmol/L (ref 3.5–5.1)
Sodium: 141 mmol/L (ref 135–145)

## 2019-06-25 MED ORDER — LEVOTHYROXINE SODIUM 150 MCG PO TABS: 150.0000 ug | ORAL_TABLET | Freq: Every day | ORAL | 0 refills | Status: DC

## 2019-06-25 MED ORDER — CEPHALEXIN 250 MG PO CAPS: 250.0000 mg | ORAL_CAPSULE | Freq: Three times a day (TID) | ORAL | 0 refills | Status: AC

## 2019-06-25 MED ORDER — FUROSEMIDE 40 MG PO TABS: 20.0000 mg | ORAL_TABLET | Freq: Every day | ORAL | 0 refills | Status: DC

## 2019-06-25 MED ORDER — LEVOTHYROXINE SODIUM 50 MCG PO TABS: 150.0000 ug | ORAL_TABLET | Freq: Every day | ORAL | Status: DC

## 2019-06-25 NOTE — TOC Transition Note (Signed)
Transition of Care [facility]) - CM/SW Discharge Note   Patient Details  Name: Diane Mullins MRN: [id number] Date of Birth: 1932/12/24  [facility] CM/SW Contact:  Candie Chroman, LCSW Phone Number: 06/25/2019, 2:28 PM   Clinical Narrative: Patient has orders to discharge back to ALF today. CSW has sent [facility]2 and discharge summary for them to review. They are here to pick her up. Daughter is aware. No further concerns. CSW signing off.    Final next level of care: Assisted Living Barriers to Discharge: Barriers Resolved   Patient Goals and CMS Choice        Discharge Placement                Patient to be transferred to facility by: ALF will transport Name of family member notified: [name] Patient and family notified of of transfer: 06/25/19  Discharge Plan and Services                                     Social Determinants of Health (SDOH) Interventions     Readmission Risk Interventions Readmission Risk Prevention Plan 06/25/2019 12/16/2018 12/15/2018  Transportation Screening - Complete Complete  PCP or Specialist Appt within 5-7 Days - - -  PCP or Specialist Appt within 3-5 Days - Complete Patient refused  Home Care Screening - - -  Medication Review (RN CM) - - -  [place] or [place] Complete Complete Complete  Social Work Consult for [place] Planning/Counseling Complete Complete Complete  Palliative Care Screening - Not Applicable Not Applicable  Medication Review Press photographer) - Complete Complete  Med Review Comments - - -  PCP or Specialist appointment within 3-5 days of discharge - - -  PCP/Specialist Appt Not Complete comments - - -  [place] or [place] recent data might be hidden

## 2019-06-25 NOTE — Discharge Summary (Signed)
Diane Mullins at [place] NAME: Diane Mullins    MR#:  [id number]  DATE OF BIRTH:  November 30, 1932  DATE OF ADMISSION:  06/23/2019 ADMITTING PHYSICIAN: Henreitta Leber, MD  DATE OF DISCHARGE: 06/25/2019  PRIMARY CARE PHYSICIAN: [name], MD   ADMISSION DIAGNOSIS:  Generalized weakness [R53.1] AKI (acute kidney injury) ([place]) [N17.9]  DISCHARGE DIAGNOSIS:  Active Problems:   Generalized weakness Acute cystitis Dehydration Chronic anemia Hypothyroidism Acute kidney injury secondary to dehydration SECONDARY DIAGNOSIS:   Past Medical History:  Diagnosis Date  . Atrial fibrillation ([place])   . Benign paroxysmal positional vertigo 07/17/2014  . Chronic diastolic CHF (congestive heart failure) ([place])    a. echo 10/19: EF of 60-65%, no RWMA, Gr2DD, calcified mitral annulus with mild MR, mildly to moderately dilated left atrium, moderate TR, PASP 52 mmHg  . Diabetes mellitus   . H/O: rheumatic fever   . Hyperlipidemia   . Hypertension   . Pulmonary hypertension ([place])   . Stroke [facility])    TIA Jan. 1st  . Teratoma of left ovary   . Thyroid disease      ADMITTING HISTORY Diane Mullins  is a 85 y.o. female with a known history of atrial fibrillation, benign positional vertigo, chronic diastolic CHF, diabetes, history of previous CVA, pulmonary hypertension, recent admission for GI bleed with a negative work-up and was given blood transfusions and IV iron now returns back to the hospital due to weakness.  Patient herself is very hard of hearing and therefore most of the history obtained from the ER physician and from the chart.  I attempted to call the patient's daughter but did not get a response.  Patient says she has been feeling increasingly weak over the past few days and her family is also concerned that she is got a slurred speech.  He denies any chest pains, shortness of breath, nausea, vomiting, abdominal pain, fever chills cough any sick contacts  or any recent travel history.  Patient presents to the ER and on blood work was noted to be in mild acute kidney injury but otherwise no other acute abnormalities.  Her hemoglobin remained stable.  Hospitalist services were contacted for admission given her profound generalized weakness.   HOSPITAL COURSE:  Patient admitted to medical floor.  Received IV fluids nephrotoxic medications were held.  Kidney functions improved patient's hemoglobin has been stable she continued iron supplements.  Thyroid profile was checked TSH was high her Synthroid medication dose was increased.  Patient responded well to IV fluids dehydration resolved and she was started on Keflex antibiotic for cystitis.  Patient will be discharged back to group home disposition plan discussed with family.  CONSULTS OBTAINED:    DRUG ALLERGIES:   Allergies  Allergen Reactions  . Oxycodone     Other reaction(s): Confusion Patient's daughter reported confusion/sensitivity to oxycodone.     DISCHARGE MEDICATIONS:   Allergies as of 06/25/2019      Reactions   Oxycodone    Other reaction(s): Confusion Patient's daughter reported confusion/sensitivity to oxycodone.      Medication List    TAKE these medications   acetaminophen 325 MG tablet Commonly known as: TYLENOL Take 650 mg by mouth every 6 (six) hours as needed for mild pain or moderate pain.   amiodarone 200 MG tablet Commonly known as: PACERONE TAKE ONE TABLET BY MOUTH ONCE DAILY   carvedilol 3.125 MG tablet Commonly known as: Coreg Take 1 tablet (3.125 mg total) by  mouth 2 (two) times daily. Hold for heart rate less than 60   cephALEXin 250 MG capsule Commonly known as: KEFLEX Take 1 capsule (250 mg total) by mouth every 8 (eight) hours for 3 days.   cyanocobalamin 1000 MCG/ML injection Commonly known as: (VITAMIN B-12) Inject 1,000 mcg into the muscle every 30 (thirty) days. What changed: Another medication with the same name was changed. Make sure  you understand how and when to take each.   CVS VITAMIN B12 1000 MCG tablet Generic drug: cyanocobalamin TAKE 1 TABLET BY MOUTH EVERY DAY What changed: how much to take   FeroSul 325 (65 FE) MG tablet Generic drug: ferrous sulfate TAKE 1 TABLET BY MOUTH EVERY DAY FOR SUPPLEMENT What changed: See the new instructions.   furosemide 40 MG tablet Commonly known as: Lasix Take 0.5 tablets (20 mg total) by mouth daily. What changed: how much to take   Levemir FlexTouch 100 UNIT/ML Pen Generic drug: Insulin Detemir Inject 10 Units into the skin daily.   levothyroxine 150 MCG tablet Commonly known as: SYNTHROID Take 1 tablet (150 mcg total) by mouth daily before breakfast. Start taking on: June 26, 2019 What changed:   medication strength  how much to take  how to take this  when to take this  additional instructions   meclizine 25 MG tablet Commonly known as: ANTIVERT Take 25 mg by mouth daily as needed for dizziness.   metFORMIN 1000 MG tablet Commonly known as: GLUCOPHAGE Take 1 tablet (1,000 mg total) by mouth 2 (two) times daily with a meal. TAKE 1 TABLET TWICE DAILY  WITH  A  MEAL   ondansetron 4 MG tablet Commonly known as: ZOFRAN Take 4 mg by mouth every 8 (eight) hours as needed for nausea.   pantoprazole 40 MG tablet Commonly known as: Protonix Take 1 tablet (40 mg total) by mouth daily.   polyethylene glycol 17 g packet Commonly known as: MIRALAX / GLYCOLAX Take 17 g by mouth daily as needed for moderate constipation.   potassium chloride 10 MEQ tablet Commonly known as: KLOR-CON TAKE 1 TABLET BY MOUTH EVERY DAY FOR SUPPLEMENT What changed: See the new instructions.   simvastatin 20 MG tablet Commonly known as: ZOCOR TAKE 1 TABLET BY MOUTH EACH NIGHT AT BEDTIME FOR CHOLESTEROL What changed: See the new instructions.       Today  Patient seen and evaluated today Mental status back to baseline Tolerating diet well Hemodynamically  stable VITAL SIGNS:  Blood pressure (!) 140/44, pulse 65, temperature 98.5 F (36.9 C), temperature source Oral, resp. rate 18, height 5\' 1"  (1.549 m), weight 62.9 kg, SpO2 99 %.  I/O:    Intake/Output Summary (Last 24 hours) at 06/25/2019 1159 Last data filed at 06/25/2019 0900 Gross per 24 hour  Intake 2095.42 ml  Output 1750 ml  Net 345.42 ml    PHYSICAL EXAMINATION:  Physical Exam  GENERAL:  85 y.o.-year-old patient lying in the bed with no acute distress.  LUNGS: Normal breath sounds bilaterally, no wheezing, rales,rhonchi or crepitation. No use of accessory muscles of respiration.  CARDIOVASCULAR: S1, S2 normal. No murmurs, rubs, or gallops.  ABDOMEN: Soft, non-tender, non-distended. Bowel sounds present. No organomegaly or mass.  NEUROLOGIC: Moves all 4 extremities. PSYCHIATRIC: The patient is alert and oriented x 3.  SKIN: No obvious rash, lesion, or ulcer.   DATA REVIEW:   CBC Recent Labs  Lab 06/23/19 1358  WBC 7.6  HGB 9.5*  HCT 31.5*  PLT 366  Chemistries  Recent Labs  Lab 06/23/19 1358  06/25/19 0429  NA 137   < > 141  K 4.5   < > 4.2  CL 99   < > 109  CO2 27   < > 28  GLUCOSE 133*   < > 84  BUN 17   < > 16  CREATININE 1.86*   < > 1.56*  CALCIUM 8.6*   < > 8.4*  AST 19  --   --   ALT 16  --   --   ALKPHOS 85  --   --   BILITOT 0.4  --   --    < > = values in this interval not displayed.    Cardiac Enzymes No results for input(s): TROPONINI in the last 168 hours.  Microbiology Results  Results for orders placed or performed during the hospital encounter of 06/23/19  SARS CORONAVIRUS 2 (TAT 6-24 HRS) Nasopharyngeal Nasopharyngeal Swab     Status: None   Collection Time: 06/23/19  3:23 PM   Specimen: Nasopharyngeal Swab  Result Value Ref Range Status   SARS Coronavirus 2 NEGATIVE NEGATIVE Final    Comment: (NOTE) SARS-CoV-2 target nucleic acids are NOT DETECTED. The SARS-CoV-2 RNA is generally detectable in upper and lower respiratory  specimens during the acute phase of infection. Negative results do not preclude SARS-CoV-2 infection, do not rule out co-infections with other pathogens, and should not be used as the sole basis for treatment or other patient management decisions. Negative results must be combined with clinical observations, patient history, and epidemiological information. The expected result is Negative. Fact Sheet for Patients: SugarRoll.be Fact Sheet for Healthcare Providers: https://www.woods-mathews.com/ This test is not yet approved or cleared by the [place] FDA and  has been authorized for detection and/or diagnosis of SARS-CoV-2 by FDA under an Emergency Use Authorization (EUA). This EUA will remain  in effect (meaning this test can be used) for the duration of the COVID-19 declaration under Section 56 4(b)(1) of the Act, 21 U.S.C. section 360bbb-3(b)(1), unless the authorization is terminated or revoked sooner. Performed at [facility], [place] [address] [place], [place] [address]     RADIOLOGY:  Dg Chest 2 View  Result Date: 06/23/2019 CLINICAL DATA:  Weakness and altered mental status. EXAM: CHEST - 2 VIEW COMPARISON:  Single-view of the chest 06/16/2019 and 10/17/2018. PA and lateral chest 07/09/2018. FINDINGS: There is cardiomegaly. Lungs are clear. Atherosclerosis noted. No pneumothorax or pleural fluid. No acute or focal bony abnormality. IMPRESSION: No acute disease. Cardiomegaly. Atherosclerosis. Electronically Signed   By: Inge Rise M.D.   On: 06/23/2019 14:28    Follow up with PCP in 1 week.  Management plans discussed with the patient, family and they are in agreement.  CODE STATUS: DNR    Code Status Orders  (From admission, onward)         Start     Ordered   06/23/19 1729  Do not attempt resuscitation (DNR)  Continuous    Question Answer Comment  In the event of cardiac or respiratory ARREST Do not call a  "code blue"   In the event of cardiac or respiratory ARREST Do not perform Intubation, CPR, defibrillation or ACLS   In the event of cardiac or respiratory ARREST Use medication by any route, position, wound care, and other measures to relive pain and suffering. May use oxygen, suction and manual treatment of airway obstruction as needed for comfort.   Comments Patient clearly  wishes to be DNR/DNI.  Patient's daughter Ms. Marlowe Kays also confirmed the same.      06/23/19 1728        Code Status History    Date Active Date Inactive Code Status Order ID Comments User Context   06/16/2019 1302 06/19/2019 1843 DNR [id number]  Otila Back, MD ED   12/11/2018 2236 12/16/2018 2005 Full Code [id number]  Arta Silence, MD Inpatient   11/26/2018 0030 11/28/2018 1939 Full Code [id number]  Lance Coon, MD Inpatient   10/17/2018 1654 10/28/2018 2002 Full Code [id number]  Gladstone Lighter, MD ED   07/05/2018 2206 07/09/2018 2156 Full Code [id number]  Salary, Avel Peace, MD Inpatient   10/28/2015 1501 10/29/2015 1718 Full Code [id number]  Henreitta Leber, MD Inpatient   09/18/2015 1639 09/20/2015 2243 Full Code [id number]  Gladstone Lighter, MD Inpatient   Advance Care Planning Activity    Advance Directive Documentation     Most Recent Value  Type of Advance Directive  Healthcare Power of Attorney, Living will  Pre-existing out of facility DNR order (yellow form or pink MOST form)  -  "MOST" Form in Place?  -      TOTAL TIME TAKING CARE OF THIS PATIENT ON DAY OF DISCHARGE: more than 30 minutes.   Saundra Shelling M.D on 06/25/2019 at 11:59 AM  Between 7am to 6pm - Pager - [phone]  After 6pm go to [email or web address] - password [email or web address] [facility]  Office  [phone]  CC: Primary care physician; [name], MD  Note: This dictation was prepared with Dragon dictation along with smaller phrase technology. Any transcriptional errors that result from this process are unintentional.

## 2019-06-25 NOTE — NC FL2 (Signed)
[place] LEVEL OF CARE SCREENING TOOL     IDENTIFICATION  Patient Name: Diane Mullins Birthdate: 06-13-1933 Sex: female Admission Date (Current Location): 06/23/2019  [place] and [place] Number:  Engineering geologist and Address:  [facility], [address], [place], [place] [address]      Provider Number: [id number]  Attending Physician Name and Address:  Saundra Shelling, MD  Relative Name and Phone Number:       Current Level of Care: Hospital Recommended Level of Care: Assisted Living Facility(with PT eval and treat.) Prior Approval Number:    Date Approved/Denied:   PASRR Number:    Discharge Plan: Other (Comment)(with PT eval and treat.)    Current Diagnoses: Patient Active Problem List   Diagnosis Date Noted  . Generalized weakness 06/23/2019  . GI bleed 06/16/2019  . B12 deficiency 04/06/2019  . Balance problem 04/06/2019  . Gastrointestinal hemorrhage with melena   . Symptomatic anemia   . Melena 12/11/2018  . Constipation 12/10/2018  . Atrial flutter ([place]) 10/17/2018  . 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 AKI (acute kidney injury) ([place])   . Acute on chronic diastolic heart failure ([place])   . Respiratory failure ([place]) 07/05/2018  . Coronary artery calcification seen on CAT scan 11/16/2017  . Morbid obesity ([place]) 10/30/2017  . (HFpEF) heart failure with preserved ejection fraction ([place]) 09/28/2017  . Family history of colon cancer 04/17/2017  . Closed right ankle fracture 10/28/2015  . Fatigue 10/18/2015  . TIA (transient ischemic attack) 09/18/2015  . Orthostatic hypotension 08/02/2015  . Adjustment disorder with anxious mood 03/30/2015  . Postmenopausal estrogen deficiency 06/03/2014  . DNR (do not resuscitate) discussion 03/04/2014  . Gait disturbance 05/07/2013  . Diabetes ([place]) 04/21/2013  . Shortness of breath 12/09/2012  . Hearing loss 12/09/2012  . Anxiety 01/31/2012  . Hypertension 12/22/2011  .  Hyperlipidemia 12/22/2011  . Hypothyroidism 12/22/2011    Orientation RESPIRATION BLADDER Height & Weight     Self, Time, Situation, Place  Normal Incontinent Weight: 138 lb 10.7 oz (62.9 kg) Height:  5\' 1"  (154.9 cm)  BEHAVIORAL SYMPTOMS/MOOD NEUROLOGICAL BOWEL NUTRITION STATUS  (None) (None) Continent Diet(Mech Soft/Regular diet (meats cut, moistened); Thin liquids. General aspiration precautions including NO Straws -- Cup drinking only.)  AMBULATORY STATUS COMMUNICATION OF NEEDS Skin   Extensive Assist Verbally Other (Comment)(MASD.)                       Personal Care Assistance Level of Assistance  Bathing, Feeding, Dressing Bathing Assistance: Limited assistance Feeding assistance: Limited assistance Dressing Assistance: Limited assistance     Functional Limitations Info  Sight, Hearing, Speech Sight Info: Adequate Hearing Info: Impaired Speech Info: Adequate    SPECIAL CARE FACTORS FREQUENCY  PT (By licensed PT)     PT Frequency: PT eval and treat              Contractures Contractures Info: Not present    Additional Factors Info  Code Status, Allergies Code Status Info: DNR Allergies Info: Oxycodone           Current Medications (06/25/2019):  This is the current hospital active medication list Current Facility-Administered Medications  Medication Dose Route Frequency Provider Last Rate Last Dose  . 0.9 %  sodium chloride infusion   Intravenous Continuous Henreitta Leber, MD 75 mL/hr at 06/24/19 1500    . acetaminophen (TYLENOL) tablet 650 mg  650 mg Oral Q6H PRN Sainani,  Belia Heman, MD       Or  . acetaminophen (TYLENOL) suppository 650 mg  650 mg Rectal Q6H PRN Henreitta Leber, MD      . amiodarone (PACERONE) tablet 200 mg  200 mg Oral Daily Henreitta Leber, MD   200 mg at 06/25/19 1056  . carvedilol (COREG) tablet 3.125 mg  3.125 mg Oral BID Henreitta Leber, MD   3.125 mg at 06/25/19 1056  . cephALEXin (KEFLEX) capsule 250 mg  250 mg Oral Q8H  Pyreddy, Pavan, MD   250 mg at 06/25/19 0549  . ferrous sulfate tablet 325 mg  325 mg Oral Daily Henreitta Leber, MD   325 mg at 06/25/19 1056  . heparin injection 5,000 Units  5,000 Units Subcutaneous Q8H Henreitta Leber, MD   5,000 Units at 06/25/19 0549  . insulin detemir (LEVEMIR) injection 10 Units  10 Units Subcutaneous Daily Henreitta Leber, MD   10 Units at 06/24/19 2231  . [START ON 06/26/2019] levothyroxine (SYNTHROID) tablet 150 mcg  150 mcg Oral QAC breakfast Pyreddy, Reatha Harps, MD      . meclizine (ANTIVERT) tablet 25 mg  25 mg Oral Daily PRN Henreitta Leber, MD      . ondansetron (ZOFRAN) tablet 4 mg  4 mg Oral Q6H PRN Henreitta Leber, MD       Or  . ondansetron (ZOFRAN) injection 4 mg  4 mg Intravenous Q6H PRN Sainani, Belia Heman, MD      . pantoprazole (PROTONIX) EC tablet 40 mg  40 mg Oral Daily Henreitta Leber, MD   40 mg at 06/25/19 1056  . polyethylene glycol (MIRALAX / GLYCOLAX) packet 17 g  17 g Oral Daily PRN Henreitta Leber, MD      . potassium chloride (KLOR-CON) CR tablet 10 mEq  10 mEq Oral Daily Henreitta Leber, MD   10 mEq at 06/25/19 1056  . simvastatin (ZOCOR) tablet 20 mg  20 mg Oral QHS Henreitta Leber, MD   20 mg at 06/24/19 2226  . vitamin B-12 (CYANOCOBALAMIN) tablet 1,000 mcg  1,000 mcg Oral Daily Henreitta Leber, MD   1,000 mcg at 06/25/19 1056     Discharge Medications: TAKE these medications   acetaminophen 325 MG tablet Commonly known as: TYLENOL Take 650 mg by mouth every 6 (six) hours as needed for mild pain or moderate pain.   amiodarone 200 MG tablet Commonly known as: PACERONE TAKE ONE TABLET BY MOUTH ONCE DAILY   carvedilol 3.125 MG tablet Commonly known as: Coreg Take 1 tablet (3.125 mg total) by mouth 2 (two) times daily. Hold for heart rate less than 60   cephALEXin 250 MG capsule Commonly known as: KEFLEX Take 1 capsule (250 mg total) by mouth every 8 (eight) hours for 3 days.   cyanocobalamin 1000 MCG/ML injection Commonly  known as: (VITAMIN B-12) Inject 1,000 mcg into the muscle every 30 (thirty) days. What changed: Another medication with the same name was changed. Make sure you understand how and when to take each.   CVS VITAMIN B12 1000 MCG tablet Generic drug: cyanocobalamin TAKE 1 TABLET BY MOUTH EVERY DAY What changed: how much to take   FeroSul 325 (65 FE) MG tablet Generic drug: ferrous sulfate TAKE 1 TABLET BY MOUTH EVERY DAY FOR SUPPLEMENT What changed: See the new instructions.   furosemide 40 MG tablet Commonly known as: Lasix Take 0.5 tablets (20 mg total) by mouth daily. What changed: how  much to take   Levemir FlexTouch 100 UNIT/ML Pen Generic drug: Insulin Detemir Inject 10 Units into the skin daily.   levothyroxine 150 MCG tablet Commonly known as: SYNTHROID Take 1 tablet (150 mcg total) by mouth daily before breakfast. Start taking on: June 26, 2019 What changed:   medication strength  how much to take  how to take this  when to take this  additional instructions   meclizine 25 MG tablet Commonly known as: ANTIVERT Take 25 mg by mouth daily as needed for dizziness.   metFORMIN 1000 MG tablet Commonly known as: GLUCOPHAGE Take 1 tablet (1,000 mg total) by mouth 2 (two) times daily with a meal. TAKE 1 TABLET TWICE DAILY  WITH  A  MEAL   ondansetron 4 MG tablet Commonly known as: ZOFRAN Take 4 mg by mouth every 8 (eight) hours as needed for nausea.   pantoprazole 40 MG tablet Commonly known as: Protonix Take 1 tablet (40 mg total) by mouth daily.   polyethylene glycol 17 g packet Commonly known as: MIRALAX / GLYCOLAX Take 17 g by mouth daily as needed for moderate constipation.   potassium chloride 10 MEQ tablet Commonly known as: KLOR-CON TAKE 1 TABLET BY MOUTH EVERY DAY FOR SUPPLEMENT What changed: See the new instructions.   simvastatin 20 MG tablet Commonly known as: ZOCOR TAKE 1 TABLET BY MOUTH EACH NIGHT AT BEDTIME FOR  CHOLESTEROL What changed: See the new instructions.     Relevant Imaging Results:  Relevant Lab Results:   Additional Information SS#: [id number]  Candie Chroman, LCSW

## 2019-06-25 NOTE — TOC Progression Note (Signed)
Transition of Care [facility]) - Progression Note    Patient Details  Name: Diane Mullins MRN: [id number] Date of Birth: 09/30/1932  [facility]) CM/SW [place], LCSW Phone Number: 06/25/2019, 12:09 PM  Clinical Narrative: [name] and discharge summary to [facility]. They will pick her up and see if they can arrange for pick up around 2:00.  Expected Discharge Plan: Assisted Living    Expected Discharge Plan and Services Expected Discharge Plan: Assisted Living         Expected Discharge Date: 06/25/19                                     Social Determinants of Health (SDOH) Interventions    Readmission Risk Interventions Readmission Risk Prevention Plan 06/25/2019 12/16/2018 12/15/2018  Transportation Screening - Complete Complete  PCP or Specialist Appt within 5-7 Days - - -  PCP or Specialist Appt within 3-5 Days - Complete Patient refused  Home Care Screening - - -  Medication Review (RN CM) - - -  [place] or [place] Complete Complete Complete  Social Work Consult for [place] Planning/Counseling Complete Complete Complete  Palliative Care Screening - Not Applicable Not Applicable  Medication Review Press photographer) - Complete Complete  Med Review Comments - - -  PCP or Specialist appointment within 3-5 days of discharge - - -  PCP/Specialist Appt Not Complete comments - - -  [place] or [place] recent data might be hidden

## 2019-06-26 LAB — URINE CULTURE: Culture: >=100000 — AB

## 2019-06-26 NOTE — Telephone Encounter (Signed)
New date of discharge 06/25/19.  Attempt to contact patient for transitional care management. No answer. Unable to leave a voice mail. Will follow as appropriate.

## 2019-06-26 NOTE — Telephone Encounter (Signed)
There is nothing else to be done at this point in time other than make sure the patient has hospital follow-up. She will remain off of aspirin and eliquis due to her GI bleed and the risk of bleeding with being on those medications.

## 2019-06-26 NOTE — Telephone Encounter (Signed)
I spoke with patient;s daughter [name] & advised on below. Patient does have appointment 07/09/19 for now. I advised that if this needed to be sooner she would be contacted. I also updated patient's chart. She only currently has the one number she can be reached at.

## 2019-06-27 NOTE — Telephone Encounter (Signed)
Second attempt since most recent discharge to reach patient. No answer. Unable to leave a message, line busy. Will follow as appropriate.

## 2019-06-30 ENCOUNTER — Telehealth: Payer: Self-pay | Admitting: Family Medicine

## 2019-06-30 NOTE — Telephone Encounter (Signed)
Attempted to contact .  buzy line x 2

## 2019-07-03 DIAGNOSIS — Z20828 Contact with and (suspected) exposure to other viral communicable diseases: Secondary | ICD-10-CM | POA: Diagnosis not present

## 2019-07-04 ENCOUNTER — Telehealth: Payer: Self-pay

## 2019-07-04 NOTE — Telephone Encounter (Signed)
I checked her chart no I didn't call pt.

## 2019-07-04 NOTE — Telephone Encounter (Signed)
Copied from [place] [phone]. Topic: General - Other >> Jul 04, 2019 12:01 PM Alease Frame wrote: Reason for CRM: patient missed phone call and would like a call back  [phone]

## 2019-07-04 NOTE — Telephone Encounter (Signed)
Amiodarone is not a medication that I prescribe. This needs to come from her cardiologist in the future. Please call the pharmacy and advise that they should request this from Dr Rockey Situ moving forward. Thanks.

## 2019-07-07 DIAGNOSIS — E119 Type 2 diabetes mellitus without complications: Secondary | ICD-10-CM | POA: Diagnosis not present

## 2019-07-08 ENCOUNTER — Telehealth: Payer: Self-pay | Admitting: *Deleted

## 2019-07-08 NOTE — Telephone Encounter (Signed)
lmov to call office for appt.   Unable to reach after multiple attempts closing encounter.

## 2019-07-08 NOTE — Telephone Encounter (Signed)
There is no note stating that we called patient. Could have been a automatic call.

## 2019-07-08 NOTE — Telephone Encounter (Signed)
Copied from [place] [phone]. Topic: General - Other >> Jul 08, 2019 11:16 AM Antonieta Iba C wrote: Reason for CRM: Marissa with [facility] called in to make provider aware that they only received the first sheet of the fax that was being sent to them on this pt. Please refax the complete paperwork again.   CB: [phone]- if needed .

## 2019-07-09 ENCOUNTER — Telehealth: Payer: Self-pay | Admitting: Family Medicine

## 2019-07-09 ENCOUNTER — Ambulatory Visit (INDEPENDENT_AMBULATORY_CARE_PROVIDER_SITE_OTHER): Payer: PPO | Admitting: Family Medicine

## 2019-07-09 ENCOUNTER — Other Ambulatory Visit: Payer: Self-pay

## 2019-07-09 ENCOUNTER — Encounter: Payer: Self-pay | Admitting: Family Medicine

## 2019-07-09 DIAGNOSIS — E538 Deficiency of other specified B group vitamins: Secondary | ICD-10-CM | POA: Diagnosis not present

## 2019-07-09 DIAGNOSIS — N3000 Acute cystitis without hematuria: Secondary | ICD-10-CM

## 2019-07-09 DIAGNOSIS — N390 Urinary tract infection, site not specified: Secondary | ICD-10-CM | POA: Insufficient documentation

## 2019-07-09 DIAGNOSIS — K921 Melena: Secondary | ICD-10-CM

## 2019-07-09 DIAGNOSIS — K922 Gastrointestinal hemorrhage, unspecified: Secondary | ICD-10-CM

## 2019-07-09 DIAGNOSIS — Z794 Long term (current) use of insulin: Secondary | ICD-10-CM

## 2019-07-09 DIAGNOSIS — R2689 Other abnormalities of gait and mobility: Secondary | ICD-10-CM | POA: Diagnosis not present

## 2019-07-09 DIAGNOSIS — E1122 Type 2 diabetes mellitus with diabetic chronic kidney disease: Secondary | ICD-10-CM

## 2019-07-09 DIAGNOSIS — N179 Acute kidney failure, unspecified: Secondary | ICD-10-CM

## 2019-07-09 DIAGNOSIS — E119 Type 2 diabetes mellitus without complications: Secondary | ICD-10-CM

## 2019-07-09 NOTE — Assessment & Plan Note (Signed)
Patient has done well with Keflex.  She will monitor for recurrence in symptoms.

## 2019-07-09 NOTE — Progress Notes (Signed)
Virtual Visit via telephone Note  This visit type was conducted due to national recommendations for restrictions regarding the COVID-19 pandemic (e.g. social distancing).  This format is felt to be most appropriate for this patient at this time.  All issues noted in this document were discussed and addressed.  No physical exam was performed (except for noted visual exam findings with Video Visits).   I connected with Diane Mullins today at 11:00 AM EDT by telephone and verified that I am speaking with the correct person using two identifiers. Location patient: home Location provider: work Persons participating in the virtual visit: patient, provider  I discussed the limitations, risks, security and privacy concerns of performing an evaluation and management service by telephone and the availability of in person appointments. I also discussed with the patient that there may be a patient responsible charge related to this service. The patient expressed understanding and agreed to proceed.  Interactive audio and video telecommunications were attempted between this provider and patient, however failed, due to patient having technical difficulties OR patient did not have access to video capability.  We continued and completed visit with audio only.   Reason for visit: follow-up  HPI: UTI: Patient was evaluated in the hospital for weakness and found to have a UTI and dehydration.  She notes she feels quite a bit better now.  She has been taking Keflex.  She denies dysuria, frequency, and urgency.  She has chronic weakness for which she was doing physical therapy.  She has difficulty walking over the last year and has been evaluated for this with neurology and in the hospital.  Physical therapy was been beneficial and she has continued to do this though her insurance has stopped paying for this.  B12 deficiency: She is getting B12 injections at her facility.  GI hemorrhage: She had a prior  hospitalization that revealed anemia related to a GI hemorrhage.  She underwent endoscopy and capsule endoscopy that revealed a small intestine ulcer that was likely source.  She notes no blood in her stool.  No melena.  She has normal bowel movements.  She is no longer on Eliquis or antiplatelet therapy.  She is on iron.  She does not have a follow-up with GI scheduled.   ROS: See pertinent positives and negatives per HPI.  Past Medical History:  Diagnosis Date  . Atrial fibrillation ([place])   . Benign paroxysmal positional vertigo 07/17/2014  . Chronic diastolic CHF (congestive heart failure) ([place])    a. echo 10/19: EF of 60-65%, no RWMA, Gr2DD, calcified mitral annulus with mild MR, mildly to moderately dilated left atrium, moderate TR, PASP 52 mmHg  . Diabetes mellitus   . H/O: rheumatic fever   . Hyperlipidemia   . Hypertension   . Pulmonary hypertension ([place])   . Stroke [facility])    TIA Jan. 1st  . Teratoma of left ovary   . Thyroid disease     Past Surgical History:  Procedure Laterality Date  . ABDOMINAL HYSTERECTOMY  1973   menorrhagia  . CARDIOVERSION N/A 10/22/2018   Procedure: CARDIOVERSION;  Surgeon: Nelva Bush, MD;  Location: [facility] ORS;  Service: Cardiovascular;  Laterality: N/A;  . CARDIOVERSION N/A 11/26/2018   Procedure: CARDIOVERSION (CATH LAB);  Surgeon: Minna Merritts, MD;  Location: [facility] ORS;  Service: Cardiovascular;  Laterality: N/A;  . COLONOSCOPY WITH PROPOFOL N/A 12/16/2018   Procedure: COLONOSCOPY WITH PROPOFOL;  Surgeon: Lin Landsman, MD;  Location: [facility] ENDOSCOPY;  Service: Gastroenterology;  Laterality:  N/A;  . ENTEROSCOPY N/A 12/16/2018   Procedure: Push ENTEROSCOPY;  Surgeon: Lin Landsman, MD;  Location: [facility] ENDOSCOPY;  Service: Gastroenterology;  Laterality: N/A;  . ESOPHAGOGASTRODUODENOSCOPY N/A 06/18/2019   Procedure: ESOPHAGOGASTRODUODENOSCOPY (EGD);  Surgeon: Lin Landsman, MD;  Location: [facility] ENDOSCOPY;  Service:  Gastroenterology;  Laterality: N/A;  . ESOPHAGOGASTRODUODENOSCOPY (EGD) WITH PROPOFOL N/A 12/14/2018   Procedure: ESOPHAGOGASTRODUODENOSCOPY (EGD) WITH PROPOFOL;  Surgeon: Lucilla Lame, MD;  Location: [facility] ENDOSCOPY;  Service: Endoscopy;  Laterality: N/A;  . GIVENS CAPSULE STUDY N/A 06/18/2019   Procedure: GIVENS CAPSULE STUDY;  Surgeon: Lin Landsman, MD;  Location: [facility] ENDOSCOPY;  Service: Gastroenterology;  Laterality: N/A;  . HERNIA REPAIR  1994  . ORIF ANKLE FRACTURE Right 10/29/2015   Procedure: OPEN REDUCTION INTERNAL FIXATION (ORIF) ANKLE FRACTURE;  Surgeon: Thornton Park, MD;  Location: [facility] ORS;  Service: Orthopedics;  Laterality: Right;  . TEE WITHOUT CARDIOVERSION N/A 10/22/2018   Procedure: TRANSESOPHAGEAL ECHOCARDIOGRAM (TEE);  Surgeon: Nelva Bush, MD;  Location: [facility] ORS;  Service: Cardiovascular;  Laterality: N/A;  . VAGINAL DELIVERY     x4    Family History  Problem Relation Age of Onset  . Heart disease Mother 56  . Heart attack Mother 16  . Cancer Father        colon  . Diabetes Sister   . Hypertension Sister   . Cancer Brother        kidney  . Diabetes Brother   . Cancer Daughter        ovarian  . Non-Hodgkin's lymphoma Son     SOCIAL HX: Non-smoker.   Current Outpatient Medications:  .  acetaminophen (TYLENOL) 325 MG tablet, Take 650 mg by mouth every 6 (six) hours as needed for mild pain or moderate pain. , Disp: , Rfl:  .  amiodarone (PACERONE) 200 MG tablet, TAKE 1 TABLET BY MOUTH DAILY, Disp: 30 tablet, Rfl: 1 .  carvedilol (COREG) 3.125 MG tablet, Take 1 tablet (3.125 mg total) by mouth 2 (two) times daily. Hold for heart rate less than 60, Disp: 60 tablet, Rfl: 11 .  CVS VITAMIN B12 1000 MCG tablet, TAKE 1 TABLET BY MOUTH EVERY DAY (Patient taking differently: Take 1,000 mcg by mouth daily. ), Disp: 90 tablet, Rfl: 1 .  cyanocobalamin (,VITAMIN B-12,) 1000 MCG/ML injection, Inject 1,000 mcg into the muscle every 30 (thirty) days., Disp: ,  Rfl:  .  FEROSUL 325 (65 Fe) MG tablet, TAKE 1 TABLET BY MOUTH EVERY DAY FOR SUPPLEMENT (Patient taking differently: Take 325 mg by mouth daily. ), Disp: 30 tablet, Rfl: 0 .  furosemide (LASIX) 40 MG tablet, Take 0.5 tablets (20 mg total) by mouth daily., Disp: 15 tablet, Rfl: 0 .  Insulin Detemir (LEVEMIR FLEXTOUCH) 100 UNIT/ML Pen, Inject 10 Units into the skin daily., Disp: 3 mL, Rfl: 0 .  levothyroxine (SYNTHROID) 150 MCG tablet, Take 1 tablet (150 mcg total) by mouth daily before breakfast., Disp: 30 tablet, Rfl: 0 .  meclizine (ANTIVERT) 25 MG tablet, Take 25 mg by mouth daily as needed for dizziness., Disp: , Rfl:  .  metFORMIN (GLUCOPHAGE) 1000 MG tablet, Take 1 tablet (1,000 mg total) by mouth 2 (two) times daily with a meal. TAKE 1 TABLET TWICE DAILY  WITH  A  MEAL, Disp: 180 tablet, Rfl: 3 .  ondansetron (ZOFRAN) 4 MG tablet, Take 4 mg by mouth every 8 (eight) hours as needed for nausea., Disp: , Rfl:  .  pantoprazole (PROTONIX) 40 MG tablet, Take 1  tablet (40 mg total) by mouth daily., Disp: 30 tablet, Rfl: 0 .  polyethylene glycol (MIRALAX / GLYCOLAX) packet, Take 17 g by mouth daily as needed for moderate constipation. , Disp: , Rfl:  .  potassium chloride (K-DUR) 10 MEQ tablet, TAKE 1 TABLET BY MOUTH EVERY DAY FOR SUPPLEMENT (Patient taking differently: Take 10 mEq by mouth daily. ), Disp: 30 tablet, Rfl: 1 .  simvastatin (ZOCOR) 20 MG tablet, TAKE 1 TABLET BY MOUTH EACH NIGHT AT BEDTIME FOR CHOLESTEROL (Patient taking differently: Take 20 mg by mouth at bedtime. ), Disp: 30 tablet, Rfl: 1  EXAM: This was a telehealth telephone visit and thus no physical exam was completed.  ASSESSMENT AND PLAN:  Discussed the following assessment and plan:  Balance problem This is a chronic issue.  She has undergone evaluation for this previously.  We will refer her back to physical therapy.  B12 deficiency Continue B12 injections.  We will have her B12 checked with labs.  Gastrointestinal  hemorrhage with melena No current symptoms of GI bleed.  At this point she will remain off of anticoagulation.  We will get her referred to GI to see if they think she can go back on anticoagulation at some point though I did discuss that it is likely she will not go back on anticoagulation in the future.  UTI (urinary tract infection) Patient has done well with Keflex.  She will monitor for recurrence in symptoms.  AKI (acute kidney injury) ([place]) Recheck labs at her facility.    I discussed the assessment and treatment plan with the patient. The patient was provided an opportunity to ask questions and all were answered. The patient agreed with the plan and demonstrated an understanding of the instructions.   The patient was advised to call back or seek an in-person evaluation if the symptoms worsen or if the condition fails to improve as anticipated.  I provided 16 minutes of non-face-to-face time during this encounter.   Tommi Rumps, MD

## 2019-07-09 NOTE — Telephone Encounter (Signed)
Patients has been scheduled next Tuesday for labs & labs have been ordered for patient.

## 2019-07-09 NOTE — Telephone Encounter (Signed)
[facility] call [facility] and see if they can arrange for labs to be drawn there.  She needs a CBC, BMP, and B12 checked as well as a urine microalbumin.  Diagnosis codes are K92.1, E53.8, N17.9, and E11.9.  Thanks.

## 2019-07-09 NOTE — Assessment & Plan Note (Signed)
This is a chronic issue.  She has undergone evaluation for this previously.  We will refer her back to physical therapy.

## 2019-07-09 NOTE — Telephone Encounter (Signed)
I called the pharmacy and explained that the provider (Dr. Caryl Bis) does not normally write for this medication, I explained that in the future Dr. Rockey Situ would be the one to write for this medication, she understood and noted the Rx.  Nana Hoselton,cma

## 2019-07-09 NOTE — Assessment & Plan Note (Signed)
No current symptoms of GI bleed.  At this point she will remain off of anticoagulation.  We will get her referred to GI to see if they think she can go back on anticoagulation at some point though I did discuss that it is likely she will not go back on anticoagulation in the future.

## 2019-07-09 NOTE — Assessment & Plan Note (Signed)
Recheck labs at her facility.

## 2019-07-09 NOTE — Assessment & Plan Note (Signed)
Continue B12 injections.  We will have her B12 checked with labs.

## 2019-07-12 ENCOUNTER — Other Ambulatory Visit: Payer: Self-pay | Admitting: Family Medicine

## 2019-07-15 ENCOUNTER — Other Ambulatory Visit: Payer: Self-pay

## 2019-07-15 ENCOUNTER — Other Ambulatory Visit (INDEPENDENT_AMBULATORY_CARE_PROVIDER_SITE_OTHER): Payer: PPO

## 2019-07-15 DIAGNOSIS — E1122 Type 2 diabetes mellitus with diabetic chronic kidney disease: Secondary | ICD-10-CM

## 2019-07-15 DIAGNOSIS — N183 Chronic kidney disease, stage 3 unspecified: Secondary | ICD-10-CM

## 2019-07-15 DIAGNOSIS — Z794 Long term (current) use of insulin: Secondary | ICD-10-CM

## 2019-07-15 DIAGNOSIS — E538 Deficiency of other specified B group vitamins: Secondary | ICD-10-CM

## 2019-07-15 DIAGNOSIS — K922 Gastrointestinal hemorrhage, unspecified: Secondary | ICD-10-CM | POA: Diagnosis not present

## 2019-07-15 DIAGNOSIS — N179 Acute kidney failure, unspecified: Secondary | ICD-10-CM | POA: Diagnosis not present

## 2019-07-15 LAB — CBC
HCT: 33.9 % — ABNORMAL LOW (ref 36.0–46.0)
Hemoglobin: 10.7 g/dL — ABNORMAL LOW (ref 12.0–15.0)
MCHC: 31.7 g/dL (ref 30.0–36.0)
MCV: 93.3 fl (ref 78.0–100.0)
Platelets: 212 K/uL (ref 150.0–400.0)
RBC: 3.63 Mil/uL — ABNORMAL LOW (ref 3.87–5.11)
RDW: 17 % — ABNORMAL HIGH (ref 11.5–15.5)
WBC: 4.7 K/uL (ref 4.0–10.5)

## 2019-07-15 LAB — BASIC METABOLIC PANEL WITH GFR
BUN: 13 mg/dL (ref 6–23)
CO2: 31 meq/L (ref 19–32)
Calcium: 8.3 mg/dL — ABNORMAL LOW (ref 8.4–10.5)
Chloride: 103 meq/L (ref 96–112)
Creatinine, Ser: 1.19 mg/dL (ref 0.40–1.20)
GFR: 43.13 mL/min — ABNORMAL LOW (ref >60.00)
Glucose, Bld: 146 mg/dL — ABNORMAL HIGH (ref 70–99)
Potassium: 4.5 meq/L (ref 3.5–5.1)
Sodium: 141 meq/L (ref 135–145)

## 2019-07-15 NOTE — Addendum Note (Signed)
Addended by: Leeanne Rio on: 07/15/2019 04:24 PM   Modules accepted: Orders

## 2019-07-16 LAB — MICROALBUMIN / CREATININE URINE RATIO
Creatinine,U: 29.6 mg/dL
Microalb Creat Ratio: 2.4 mg/g (ref 0.0–30.0)
Microalb, Ur: <0.7 mg/dL (ref 0.0–1.9)

## 2019-07-17 LAB — VITAMIN B12: Vitamin B-12: 350 pg/mL (ref 211–911)

## 2019-07-20 DIAGNOSIS — I5032 Chronic diastolic (congestive) heart failure: Secondary | ICD-10-CM | POA: Diagnosis not present

## 2019-07-26 ENCOUNTER — Other Ambulatory Visit: Payer: Self-pay | Admitting: Family Medicine

## 2019-07-28 NOTE — Telephone Encounter (Signed)
Last fill 12/04/18

## 2019-07-28 NOTE — Telephone Encounter (Signed)
This is typically short-term medication.  Please contact the patient and see what she needs this refilled for her.  Thanks.

## 2019-07-29 ENCOUNTER — Telehealth: Payer: Self-pay

## 2019-07-29 DIAGNOSIS — N179 Acute kidney failure, unspecified: Secondary | ICD-10-CM

## 2019-07-29 NOTE — Telephone Encounter (Signed)
Please confirm with them whether or not she has been taking her lasix daily. Thanks.

## 2019-07-29 NOTE — Telephone Encounter (Signed)
Copied from [place] [id number]. Topic: General - Other >> Jul 29, 2019 10:02 AM Yvette Rack wrote: Reason for CRM: [facility] called to request Rx for furosemide (LASIX) 40 MG tablet and levothyroxine (SYNTHROID) 150 MCG tablet be faxed to [phone]

## 2019-07-29 NOTE — Telephone Encounter (Signed)
[facility] called to request Rx for furosemide (LASIX) 40 MG tablet and levothyroxine (SYNTHROID) 150 MCG tablet be faxed to [phone].  Marche Hottenstein,cma

## 2019-07-30 MED ORDER — LEVOTHYROXINE SODIUM 150 MCG PO TABS: 150.0000 ug | ORAL_TABLET | Freq: Every day | ORAL | 1 refills | Status: DC

## 2019-07-30 MED ORDER — SIMVASTATIN 20 MG PO TABS: ORAL_TABLET | ORAL | 1 refills | Status: DC

## 2019-07-30 MED ORDER — LEVOTHYROXINE SODIUM 150 MCG PO TABS: 150.0000 ug | ORAL_TABLET | Freq: Every day | ORAL | 2 refills | Status: DC

## 2019-07-30 MED ORDER — PANTOPRAZOLE SODIUM 40 MG PO TBEC: 40.0000 mg | DELAYED_RELEASE_TABLET | Freq: Every day | ORAL | 1 refills | Status: DC

## 2019-07-30 MED ORDER — LEVEMIR FLEXTOUCH 100 UNIT/ML ~~LOC~~ SOPN: 10.0000 [IU] | PEN_INJECTOR | Freq: Every day | SUBCUTANEOUS | 2 refills | Status: DC

## 2019-07-30 MED ORDER — METFORMIN HCL 1000 MG PO TABS: 1000.0000 mg | ORAL_TABLET | Freq: Two times a day (BID) | ORAL | 3 refills | Status: DC

## 2019-07-30 MED ORDER — FUROSEMIDE 40 MG PO TABS: 20.0000 mg | ORAL_TABLET | Freq: Every day | ORAL | 0 refills | Status: DC

## 2019-07-30 MED ORDER — FUROSEMIDE 40 MG PO TABS: 20.0000 mg | ORAL_TABLET | Freq: Every day | ORAL | 1 refills | Status: DC

## 2019-07-30 NOTE — Telephone Encounter (Signed)
It is ok for her to give her insulin in her thighs. It should NOT be given in the inner thigh. It needs to be given in the subcutaneous tissue. I will refill the zofran though she should complete a visit at some point to discuss this further.

## 2019-07-30 NOTE — Telephone Encounter (Signed)
Diane Mullins at [facility] called to say that a fax they are receiving for the patient is not coming through fully. They have received the cover sheet twice but nothing else. Please refax papers to Fax# [phone] Ph# [phone]

## 2019-07-30 NOTE — Telephone Encounter (Signed)
I called and spoke with the RN Vaughan Basta at the facility and she confirmed that the patient is taking the lasix daily.  Bristol Soy,cma

## 2019-07-30 NOTE — Telephone Encounter (Signed)
I called and spoke with the patient and she stated taht she needs it for her bouts of nausea and vomiting she states it has not happened in a week but it does happen and she just wants to have it on hand when it does.  Also the patient wanted to know if it is ok for her to give herself her insulin in her thighs and if it is ok she needs it written and sent to the facility and she would like her routine medications in a 90 day supply and this needs to be written to the facility as well. Nina,cma

## 2019-07-30 NOTE — Telephone Encounter (Signed)
Noted. Printed. Please fax. She needs to have her kidney function rechecked with being on the lasix. This could be done at her facility or in the office. BMET ordered.

## 2019-07-30 NOTE — Telephone Encounter (Signed)
The refills and note about the injections was faxed to the facility [facility]) and confirmed, the facility will call and schedule a lab appointment for the patient to come in and have labs.  Melisha Eggleton,cma

## 2019-07-31 NOTE — Telephone Encounter (Signed)
I had a lot of difficulty faxing the Rx's to [facility], I called and received a e-mail address for the Director , [email or web address].  The e-mail was sent and confirmed.  Nina,cma

## 2019-07-31 NOTE — Telephone Encounter (Signed)
I did refax the forms and received a confirmation.  Nina,cma

## 2019-08-04 ENCOUNTER — Telehealth: Payer: Self-pay | Admitting: Family Medicine

## 2019-08-04 ENCOUNTER — Telehealth: Payer: Self-pay | Admitting: *Deleted

## 2019-08-04 NOTE — Telephone Encounter (Signed)
Mable Fill is calling from [facility], calling to report the patient had a fall, no injuries.  The staff will keep an eye on her. Paitient denies wanting to go to ED.  [phone]

## 2019-08-04 NOTE — Telephone Encounter (Signed)
I called and spoke with the patient and informed her that because she is on the medication Lasix the provider is keeping ane eye on her kidney functions and that is why she is having labs again.  Patient understood.   Tine Mabee,cma

## 2019-08-04 NOTE — Telephone Encounter (Signed)
Copied from [place] [id number]. Topic: General - Other >> Aug 04, 2019  9:07 AM Leward Quan A wrote: Reason for CRM: Patient called to ask for a call back from Dr Caryl Bis nurse about the lab work that she is scheduled for on 08/06/2019 she states that she does not understand why she need to redo this lab work. Please call patient at Ph#    [phone]

## 2019-08-05 ENCOUNTER — Other Ambulatory Visit: Payer: Self-pay

## 2019-08-05 ENCOUNTER — Ambulatory Visit: Payer: PPO | Admitting: Family Medicine

## 2019-08-06 ENCOUNTER — Encounter: Payer: Self-pay | Admitting: Family Medicine

## 2019-08-06 ENCOUNTER — Other Ambulatory Visit: Payer: Self-pay

## 2019-08-06 ENCOUNTER — Ambulatory Visit (INDEPENDENT_AMBULATORY_CARE_PROVIDER_SITE_OTHER): Payer: PPO | Admitting: Family Medicine

## 2019-08-06 ENCOUNTER — Other Ambulatory Visit: Payer: PPO

## 2019-08-06 VITALS — BP 118/52 | HR 66 | Temp 96.7°F

## 2019-08-06 DIAGNOSIS — N179 Acute kidney failure, unspecified: Secondary | ICD-10-CM

## 2019-08-06 DIAGNOSIS — I10 Essential (primary) hypertension: Secondary | ICD-10-CM | POA: Diagnosis not present

## 2019-08-06 DIAGNOSIS — E1122 Type 2 diabetes mellitus with diabetic chronic kidney disease: Secondary | ICD-10-CM | POA: Diagnosis not present

## 2019-08-06 DIAGNOSIS — N183 Chronic kidney disease, stage 3 unspecified: Secondary | ICD-10-CM

## 2019-08-06 DIAGNOSIS — Z794 Long term (current) use of insulin: Secondary | ICD-10-CM | POA: Diagnosis not present

## 2019-08-06 LAB — BASIC METABOLIC PANEL
BUN: 11 mg/dL (ref 6–23)
CO2: 32 mEq/L (ref 19–32)
Calcium: 8.5 mg/dL (ref 8.4–10.5)
Chloride: 100 mEq/L (ref 96–112)
Creatinine, Ser: 1.18 mg/dL (ref 0.40–1.20)
GFR: 43.55 mL/min — ABNORMAL LOW (ref >60.00)
Glucose, Bld: 180 mg/dL — ABNORMAL HIGH (ref 70–99)
Potassium: 4.3 mEq/L (ref 3.5–5.1)
Sodium: 140 mEq/L (ref 135–145)

## 2019-08-06 NOTE — Progress Notes (Signed)
Subjective:    Patient ID: Diane Mullins, female    DOB: 1933/04/21, 85 y.o.   MRN: [id number]  HPI  Patient resents today due to concerns over Dr. Caryl Bis being out for " awhile" and feeling that she needed to come in to be seen because she has not seen a doctor in a while. Denies complaints today.  Patient lives at assisted living facility.  Has a staff that helps her to the bathroom, make sure she gets all her meals checks her blood sugar every morning and monitors her blood pressure.  Overall feels like she is doing well and enjoys living at assisted living facility.  Blood sugars have been remaining stable ranging from some 70s to low 100s.  Denies ever feeling hypoglycemic.  States staff is very good about making sure she has food and checking her blood sugar.  Blood pressures have been stable.  Denies any chest pain or palpitations.  Denies any worsening leg swelling.  Has follow-up lab work planned today that was ordered by PCP for recheck of kidney functions.  Patient Active Problem List   Diagnosis Date Noted  . UTI (urinary tract infection) 07/09/2019  . Generalized weakness 06/23/2019  . GI bleed 06/16/2019  . B12 deficiency 04/06/2019  . Balance problem 04/06/2019  . Gastrointestinal hemorrhage with melena   . Symptomatic anemia   . Melena 12/11/2018  . Constipation 12/10/2018  . Atrial flutter ([place]) 10/17/2018  . 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 AKI (acute kidney injury) ([place])   . Acute on chronic diastolic heart failure ([place])   . Respiratory failure ([place]) 07/05/2018  . Coronary artery calcification seen on CAT scan 11/16/2017  . Morbid obesity ([place]) 10/30/2017  . (HFpEF) heart failure with preserved ejection fraction ([place]) 09/28/2017  . Family history of colon cancer 04/17/2017  . Closed right ankle fracture 10/28/2015  . Fatigue 10/18/2015  . TIA (transient ischemic attack) 09/18/2015  . Orthostatic hypotension 08/02/2015  . Adjustment disorder with anxious  mood 03/30/2015  . Postmenopausal estrogen deficiency 06/03/2014  . DNR (do not resuscitate) discussion 03/04/2014  . Gait disturbance 05/07/2013  . Diabetes ([place]) 04/21/2013  . Shortness of breath 12/09/2012  . Hearing loss 12/09/2012  . Anxiety 01/31/2012  . Hypertension 12/22/2011  . Hyperlipidemia 12/22/2011  . Hypothyroidism 12/22/2011   Social History   Tobacco Use  . Smoking status: Never Smoker  . Smokeless tobacco: Never Used  Substance Use Topics  . Alcohol use: No   Review of Systems  Constitutional: Negative for chills, fatigue and fever.  HENT: Negative for congestion, ear pain, sinus pain and sore throat.   Eyes: Negative.   Respiratory: Negative for cough, shortness of breath and wheezing.   Cardiovascular: Negative for chest pain, palpitations and leg swelling.  Gastrointestinal: Negative for abdominal pain, diarrhea, nausea and vomiting.  Genitourinary: Negative for dysuria, frequency and urgency.  Musculoskeletal: Negative for arthralgias and myalgias.  Skin: Negative for color change, pallor and rash.  Neurological: Negative for syncope, light-headedness and headaches.  Psychiatric/Behavioral: The patient is not nervous/anxious.       Objective:   Physical Exam Vitals signs and nursing note reviewed.  Constitutional:      General: She is not in acute distress.    Appearance: She is not toxic-appearing.     Comments: In wheelchair  Eyes:     General: No scleral icterus.    Extraocular Movements: Extraocular movements intact.     Conjunctiva/sclera: Conjunctivae normal.  Pupils: Pupils are equal, round, and reactive to light.  Neck:     Musculoskeletal: Normal range of motion and neck supple. No neck rigidity.     Vascular: No carotid bruit.  Cardiovascular:     Rate and Rhythm: Normal rate and regular rhythm.  Pulmonary:     Effort: Pulmonary effort is normal. No respiratory distress.     Breath sounds: Normal breath sounds.  Abdominal:      General: Bowel sounds are normal.     Palpations: Abdomen is soft.     Tenderness: There is no abdominal tenderness. There is no guarding.  Skin:    General: Skin is warm and dry.     Coloration: Skin is not jaundiced or pale.  Neurological:     Mental Status: She is alert and oriented to person, place, and time. Mental status is at baseline.  Psychiatric:        Mood and Affect: Mood normal.        Thought Content: Thought content normal.    Today's Vitals   08/06/19 1011  BP: (!) 118/52  Pulse: 66  Temp: (!) 96.7 F (35.9 C)  TempSrc: Temporal  SpO2: 97%   There is no height or weight on file to calculate BMI.     Assessment & Plan:    Essential hypertension  AKI (acute kidney injury) ([place]) - Plan: Basic Metabolic Panel (BMET)  Type 2 diabetes mellitus with stage 3 chronic kidney disease, with long-term current use of insulin, unspecified whether stage 3a or 3b CKD ([place])  Patient appears to be at her baseline.  She will get lab work as ordered by PCP.  Will work for assisted living facility filled out, patient will have no changes to medicines at this time.  Reassured patient that all of her chronic medical issues appear to be stable and that her PCP will be back in the office in the next few weeks.  Patient feels reassured after coming into office today and learning that PCP will not be gone for as long as a time period as she had thought.   Patient will keep follow-up as planned with PCP in February 2021

## 2019-08-08 ENCOUNTER — Other Ambulatory Visit: Payer: Self-pay | Admitting: Family Medicine

## 2019-08-08 DIAGNOSIS — Z760 Encounter for issue of repeat prescription: Secondary | ICD-10-CM

## 2019-08-12 DIAGNOSIS — Z20828 Contact with and (suspected) exposure to other viral communicable diseases: Secondary | ICD-10-CM | POA: Diagnosis not present

## 2019-08-12 NOTE — Telephone Encounter (Signed)
I called to speak to Marissa from the [facility] to make sure that patient was doing okay since her fall. She stated that she was & patient had no complaints of pain, blurry vision, confusion. She said that she had complain of dizziness like vertigo & requested some zofran but this was nothing new for patient. She said that patient seemed fine & was her usual self.  I just wanted to sent this to someone as Juluis Rainier since it wasn't on Friday. Patient seems fine though according to caregivers. I asked that they call back if any changes,

## 2019-08-13 ENCOUNTER — Other Ambulatory Visit: Payer: Self-pay | Admitting: Family Medicine

## 2019-08-13 DIAGNOSIS — Z760 Encounter for issue of repeat prescription: Secondary | ICD-10-CM

## 2019-08-18 NOTE — Telephone Encounter (Signed)
potassium chloride (K-DUR) 10 MEQ tablet carvedilol (COREG) 3.125 MG tablet amiodarone (PACERONE) 200 MG tablet CVS VITAMIN B12 1000 MCG tablet   Requesting fax to [facility]  Fax: [phone]  Best contact: [place]

## 2019-08-18 NOTE — Telephone Encounter (Signed)
Looks like both of these rxs were discontinued by a different provider on 06/19/2019.

## 2019-08-19 DIAGNOSIS — I5032 Chronic diastolic (congestive) heart failure: Secondary | ICD-10-CM | POA: Diagnosis not present

## 2019-08-19 MED ORDER — CARVEDILOL 3.125 MG PO TABS: 3.1250 mg | ORAL_TABLET | Freq: Two times a day (BID) | ORAL | 11 refills | Status: DC

## 2019-08-19 MED ORDER — CYANOCOBALAMIN 1000 MCG PO TABS: 1000.0000 ug | ORAL_TABLET | Freq: Every day | ORAL | 1 refills | Status: DC

## 2019-08-19 MED ORDER — POTASSIUM CHLORIDE ER 10 MEQ PO TBCR: 10.0000 meq | EXTENDED_RELEASE_TABLET | Freq: Every day | ORAL | 1 refills | Status: DC

## 2019-08-19 NOTE — Telephone Encounter (Signed)
Diane Mullins is calling back to check status of medication refill  [place]

## 2019-08-19 NOTE — Telephone Encounter (Signed)
Patient should no longer be on aspirin. She should be on protonix and not omeprazole. They need to contact her cardiologist for the amiodarone refill.

## 2019-08-20 DIAGNOSIS — Z20828 Contact with and (suspected) exposure to other viral communicable diseases: Secondary | ICD-10-CM | POA: Diagnosis not present

## 2019-08-26 ENCOUNTER — Ambulatory Visit: Payer: PPO | Admitting: Gastroenterology

## 2019-08-26 DIAGNOSIS — Z20828 Contact with and (suspected) exposure to other viral communicable diseases: Secondary | ICD-10-CM | POA: Diagnosis not present

## 2019-08-29 ENCOUNTER — Other Ambulatory Visit: Payer: Self-pay | Admitting: Family Medicine

## 2019-09-02 ENCOUNTER — Telehealth: Payer: Self-pay | Admitting: Family Medicine

## 2019-09-02 NOTE — Telephone Encounter (Signed)
Diane Mullins [phone] is calling from [facility] regarding pt appetite is really low pt did not eat breakfast lunch or dinner yesterday. Over the weekend no appetite. Sugar was 73 this morning. Pt has been complaining of nausea pt has been giving the zophram. Diane Mullins is concerned about pt not having appetite. Please advise and Thank you!

## 2019-09-04 NOTE — Telephone Encounter (Signed)
She needs to complete a visit for this. Please offer her a virtual visit with me. If there are none available in the next few days or early next week then let me know.

## 2019-09-04 NOTE — Telephone Encounter (Signed)
I called and spoke with Vaughan Basta, RN at [facility] and I scheduled the patient for a virtual visit on 09/08/2019 @ #:45 pm per Dr. Caryl Bis.  Moyses Pavey,cma

## 2019-09-04 NOTE — Telephone Encounter (Signed)
[name] [phone] is calling from [facility] regarding pt appetite is really low pt did not eat breakfast lunch or dinner yesterday. Over the weekend no appetite. Sugar was 73 this morning. Pt has been complaining of nausea pt has been giving the zophram. [name] is concerned about pt not having appetite. Please advise and Thank you  Krithi Bray,cma

## 2019-09-05 ENCOUNTER — Telehealth: Payer: Self-pay | Admitting: Family Medicine

## 2019-09-05 NOTE — Telephone Encounter (Signed)
She should not be on eliquis. Please see if they can take a verbal order to discontinue the eliquis. Thanks.

## 2019-09-05 NOTE — Telephone Encounter (Signed)
The Eliquis was discontinued when the patient was in the hospital for a bleed and the pharmacy called wanting an order for discontinuing the medication, I called the facility and they stated if you wanted it started back to fax an order stating that she should be on eliquis.  Nazly Digilio,cma

## 2019-09-05 NOTE — Telephone Encounter (Signed)
Diane Mullins called from [facility] and said they need an order sent over saying that the pt's prescription for Elliquis has been discontinued.  Fax to [phone]

## 2019-09-08 ENCOUNTER — Telehealth: Payer: Self-pay | Admitting: Family Medicine

## 2019-09-08 ENCOUNTER — Ambulatory Visit (INDEPENDENT_AMBULATORY_CARE_PROVIDER_SITE_OTHER): Payer: PPO | Admitting: Family Medicine

## 2019-09-08 ENCOUNTER — Other Ambulatory Visit: Payer: Self-pay

## 2019-09-08 DIAGNOSIS — E1121 Type 2 diabetes mellitus with diabetic nephropathy: Secondary | ICD-10-CM

## 2019-09-08 DIAGNOSIS — Z794 Long term (current) use of insulin: Secondary | ICD-10-CM | POA: Diagnosis not present

## 2019-09-08 DIAGNOSIS — N1831 Chronic kidney disease, stage 3a: Secondary | ICD-10-CM

## 2019-09-08 DIAGNOSIS — R630 Anorexia: Secondary | ICD-10-CM

## 2019-09-08 DIAGNOSIS — F321 Major depressive disorder, single episode, moderate: Secondary | ICD-10-CM | POA: Diagnosis not present

## 2019-09-08 MED ORDER — MIRTAZAPINE 7.5 MG PO TABS: 7.5000 mg | ORAL_TABLET | Freq: Every day | ORAL | 2 refills | Status: DC

## 2019-09-08 MED ORDER — LEVEMIR FLEXTOUCH 100 UNIT/ML ~~LOC~~ SOPN: 6.0000 [IU] | PEN_INJECTOR | Freq: Every day | SUBCUTANEOUS | 2 refills | Status: DC

## 2019-09-08 NOTE — Telephone Encounter (Signed)
I called and informed the facility and they understood orders.  Nina,cma

## 2019-09-08 NOTE — Telephone Encounter (Signed)
Faxed over D/C for eliquis to [facility].  Dexton Zwilling,cma

## 2019-09-08 NOTE — Progress Notes (Signed)
Virtual Visit via video Note  This visit type was conducted due to national recommendations for restrictions regarding the COVID-19 pandemic (e.g. social distancing).  This format is felt to be most appropriate for this patient at this time.  All issues noted in this document were discussed and addressed.  No physical exam was performed (except for noted visual exam findings with Video Visits).   I connected with Marcy Salvo today at  3:45 PM EST by a video enabled telemedicine application and verified that I am speaking with the correct person using two identifiers. Location patient: home Location provider: work Persons participating in the virtual visit: patient, provider  I discussed the limitations, risks, security and privacy concerns of performing an evaluation and management service by telephone and the availability of in person appointments. I also discussed with the patient that there may be a patient responsible charge related to this service. The patient expressed understanding and agreed to proceed.   Reason for visit: Follow-up  HPI: Decreased appetite: Patient notes her appetite has not been good for quite some time.  She relates this to not being able to cook for herself.  She does eat 3 meals a day.  She does not think she has lost any weight.  She has had vomiting intermittently over the past year.  She notes overall she has not been feeling well for a year.  She vomits up food.  No blood in her vomitus.  No diarrhea.  No abdominal pain.  She has not had any blood in her stool or melena.  No night sweats.  No fevers.  She does get strangled when she drinks liquids and then she will cough with that though no issues with solids.  She does have follow-up with GI for issues with melena.  Diabetes: She has been taking Levemir 10 units daily.  Her glucose has been 65-72 in the morning fasting.  She notes she is asymptomatic when her blood sugar is that low.  She continues on  Metformin.  Depression: Patient notes she just does not care anymore.  She has only hospital bills that she worries about.  She is happy with her life and her family.  She notes no SI.  She notes she would be willing to try medication to help her feel better.   ROS: See pertinent positives and negatives per HPI.  Past Medical History:  Diagnosis Date  . Atrial fibrillation ([place])   . Benign paroxysmal positional vertigo 07/17/2014  . Chronic diastolic CHF (congestive heart failure) ([place])    a. echo 10/19: EF of 60-65%, no RWMA, Gr2DD, calcified mitral annulus with mild MR, mildly to moderately dilated left atrium, moderate TR, PASP 52 mmHg  . Diabetes mellitus   . H/O: rheumatic fever   . Hyperlipidemia   . Hypertension   . Pulmonary hypertension ([place])   . Stroke [facility])    TIA Jan. 1st  . Teratoma of left ovary   . Thyroid disease     Past Surgical History:  Procedure Laterality Date  . ABDOMINAL HYSTERECTOMY  1973   menorrhagia  . CARDIOVERSION N/A 10/22/2018   Procedure: CARDIOVERSION;  Surgeon: Nelva Bush, MD;  Location: [facility] ORS;  Service: Cardiovascular;  Laterality: N/A;  . CARDIOVERSION N/A 11/26/2018   Procedure: CARDIOVERSION (CATH LAB);  Surgeon: Minna Merritts, MD;  Location: [facility] ORS;  Service: Cardiovascular;  Laterality: N/A;  . COLONOSCOPY WITH PROPOFOL N/A 12/16/2018   Procedure: COLONOSCOPY WITH PROPOFOL;  Surgeon: Lin Landsman,  MD;  Location: [facility] ENDOSCOPY;  Service: Gastroenterology;  Laterality: N/A;  . ENTEROSCOPY N/A 12/16/2018   Procedure: Push ENTEROSCOPY;  Surgeon: Lin Landsman, MD;  Location: [facility] ENDOSCOPY;  Service: Gastroenterology;  Laterality: N/A;  . ESOPHAGOGASTRODUODENOSCOPY N/A 06/18/2019   Procedure: ESOPHAGOGASTRODUODENOSCOPY (EGD);  Surgeon: Lin Landsman, MD;  Location: [facility] ENDOSCOPY;  Service: Gastroenterology;  Laterality: N/A;  . ESOPHAGOGASTRODUODENOSCOPY (EGD) WITH PROPOFOL N/A 12/14/2018   Procedure:  ESOPHAGOGASTRODUODENOSCOPY (EGD) WITH PROPOFOL;  Surgeon: Lucilla Lame, MD;  Location: [facility] ENDOSCOPY;  Service: Endoscopy;  Laterality: N/A;  . GIVENS CAPSULE STUDY N/A 06/18/2019   Procedure: GIVENS CAPSULE STUDY;  Surgeon: Lin Landsman, MD;  Location: [facility] ENDOSCOPY;  Service: Gastroenterology;  Laterality: N/A;  . HERNIA REPAIR  1994  . ORIF ANKLE FRACTURE Right 10/29/2015   Procedure: OPEN REDUCTION INTERNAL FIXATION (ORIF) ANKLE FRACTURE;  Surgeon: Thornton Park, MD;  Location: [facility] ORS;  Service: Orthopedics;  Laterality: Right;  . TEE WITHOUT CARDIOVERSION N/A 10/22/2018   Procedure: TRANSESOPHAGEAL ECHOCARDIOGRAM (TEE);  Surgeon: Nelva Bush, MD;  Location: [facility] ORS;  Service: Cardiovascular;  Laterality: N/A;  . VAGINAL DELIVERY     x4    Family History  Problem Relation Age of Onset  . Heart disease Mother 46  . Heart attack Mother 17  . Cancer Father        colon  . Diabetes Sister   . Hypertension Sister   . Cancer Brother        kidney  . Diabetes Brother   . Cancer Daughter        ovarian  . Non-Hodgkin's lymphoma Son     SOCIAL HX: Non-smoker   Current Outpatient Medications:  .  acetaminophen (TYLENOL) 325 MG tablet, Take 650 mg by mouth every 6 (six) hours as needed for mild pain or moderate pain. , Disp: , Rfl:  .  amiodarone (PACERONE) 200 MG tablet, TAKE 1 TABLET BY MOUTH DAILY, Disp: 30 tablet, Rfl: 1 .  carvedilol (COREG) 3.125 MG tablet, Take 1 tablet (3.125 mg total) by mouth 2 (two) times daily. Hold for heart rate less than 60, Disp: 60 tablet, Rfl: 11 .  cyanocobalamin (,VITAMIN B-12,) 1000 MCG/ML injection, Inject 1,000 mcg into the muscle every 30 (thirty) days., Disp: , Rfl:  .  cyanocobalamin ([employer] VITAMIN B12) 1000 MCG tablet, Take 1 tablet (1,000 mcg total) by mouth daily., Disp: 90 tablet, Rfl: 1 .  ferrous sulfate (FEROSUL) 325 (65 FE) MG tablet, Take 1 tablet (325 mg total) by mouth daily., Disp: 90 tablet, Rfl: 1 .  furosemide  (LASIX) 40 MG tablet, Take 0.5 tablets (20 mg total) by mouth daily., Disp: 45 tablet, Rfl: 0 .  Insulin Detemir (LEVEMIR FLEXTOUCH) 100 UNIT/ML Pen, Inject 6 Units into the skin daily. Can use abdominal and outer thigh subcutaneous tissue., Disp: 3 mL, Rfl: 2 .  levothyroxine (SYNTHROID) 150 MCG tablet, Take 1 tablet (150 mcg total) by mouth daily before breakfast., Disp: 90 tablet, Rfl: 1 .  meclizine (ANTIVERT) 25 MG tablet, Take 25 mg by mouth daily as needed for dizziness., Disp: , Rfl:  .  metFORMIN (GLUCOPHAGE) 1000 MG tablet, Take 1 tablet (1,000 mg total) by mouth 2 (two) times daily with a meal. TAKE 1 TABLET TWICE DAILY  WITH  A  MEAL, Disp: 180 tablet, Rfl: 3 .  ondansetron (ZOFRAN) 4 MG tablet, Take 4 mg by mouth every 8 (eight) hours as needed for nausea., Disp: , Rfl:  .  ondansetron (ZOFRAN-ODT) 4 MG  disintegrating tablet, TAKE 1 TABLET BY MOUTH EVERY 8 HOURS AS NEEDED FOR NAUSEA OR VOMITTING, Disp: 30 tablet, Rfl: 0 .  pantoprazole (PROTONIX) 40 MG tablet, Take 1 tablet (40 mg total) by mouth daily., Disp: 90 tablet, Rfl: 1 .  polyethylene glycol (MIRALAX / GLYCOLAX) packet, Take 17 g by mouth daily as needed for moderate constipation. , Disp: , Rfl:  .  potassium chloride (KLOR-CON) 10 MEQ tablet, Take 1 tablet (10 mEq total) by mouth daily., Disp: 90 tablet, Rfl: 1 .  simvastatin (ZOCOR) 20 MG tablet, TAKE 1 TABLET BY MOUTH EACH NIGHT AT BEDTIME FOR CHOLESTEROL, Disp: 90 tablet, Rfl: 1 .  TRUE METRIX BLOOD GLUCOSE TEST test strip, CHECK FASTING BLOOD SUGAR EVERY MORNING, Disp: 50 each, Rfl: 6 .  mirtazapine (REMERON) 7.5 MG tablet, Take 1 tablet (7.5 mg total) by mouth at bedtime., Disp: 30 tablet, Rfl: 2  EXAM:  VITALS per patient if applicable:  GENERAL: alert, oriented, appears well and in no acute distress  HEENT: atraumatic, conjunttiva clear, no obvious abnormalities on inspection of external nose and ears  NECK: normal movements of the head and neck  LUNGS: on  inspection no signs of respiratory distress, breathing rate appears normal, no obvious gross SOB, gasping or wheezing  CV: no obvious cyanosis  MS: moves all visible extremities without noticeable abnormality  PSYCH/NEURO: pleasant and cooperative, no obvious depression or anxiety, speech and thought processing grossly intact  ASSESSMENT AND PLAN:  Discussed the following assessment and plan:  Decreased appetite I suspect this is related to a combination of things.  She is not eating her own cooking now and that likely plays a role.  I also think depression is playing a role as well.  We will see if the Remeron for treatment of depression helps with her appetite.  She will also see GI to discuss the vomiting issues that she has had as well as her lack of appetite.  She did have several weights previously that were significantly lower than any of her prior weights.  I suspect these could be spurious reading though there are multiple readings with similar weights of 138 pounds.  She then would have lost 50+ pounds in about 2 months.  We will have her come in for a weight check to confirm her current weight.  The patient reports that she has not felt as though she has lost any weight.  If her weight truly is down that much we will need to evaluate further.  Depression, major, single episode, moderate ([place]) I suspect this is playing a role in her decreased appetite and potential weight loss.  We will start her on Remeron to see if this is beneficial.  Plan for follow-up in 2 months.  Diabetes [facility]) Patient reports several low/borderline low sugars.  We will decrease her Levemir to 6 units daily.  I will have my CMA contact her living facility to inform them of this.  She will monitor her sugars.  If she continues to run on the low side they will let us know.    I discussed the assessment and treatment plan with the patient. The patient was provided an opportunity to ask questions and all were  answered. The patient agreed with the plan and demonstrated an understanding of the instructions.   The patient was advised to call back or seek an in-person evaluation if the symptoms worsen or if the condition fails to improve as anticipated.   Tommi Rumps, MD

## 2019-09-08 NOTE — Telephone Encounter (Signed)
Please contact the patient's living facility and let them know I would like to decrease her Levemir to 6 units once daily.  The need to monitor her blood sugar and if it continues to run less than 70 with this change they need to let us know.  If it goes up above 150 consistently they should let us know as well.  I am going to start her on Remeron 7.5 mg by mouth once daily.  I have sent this to her pharmacy.  I would also like to have her scheduled for a lab appointment and weight check in the office if possible.  This should be done sometime the week after New Year's.  Thanks.

## 2019-09-10 DIAGNOSIS — F321 Major depressive disorder, single episode, moderate: Secondary | ICD-10-CM | POA: Insufficient documentation

## 2019-09-10 DIAGNOSIS — R630 Anorexia: Secondary | ICD-10-CM | POA: Insufficient documentation

## 2019-09-10 NOTE — Assessment & Plan Note (Signed)
Patient reports several low/borderline low sugars.  We will decrease her Levemir to 6 units daily.  I will have my CMA contact her living facility to inform them of this.  She will monitor her sugars.  If she continues to run on the low side they will let us know.

## 2019-09-10 NOTE — Assessment & Plan Note (Signed)
I suspect this is playing a role in her decreased appetite and potential weight loss.  We will start her on Remeron to see if this is beneficial.  Plan for follow-up in 2 months.

## 2019-09-10 NOTE — Assessment & Plan Note (Signed)
I suspect this is related to a combination of things.  She is not eating her own cooking now and that likely plays a role.  I also think depression is playing a role as well.  We will see if the Remeron for treatment of depression helps with her appetite.  She will also see GI to discuss the vomiting issues that she has had as well as her lack of appetite.  She did have several weights previously that were significantly lower than any of her prior weights.  I suspect these could be spurious reading though there are multiple readings with similar weights of 138 pounds.  She then would have lost 50+ pounds in about 2 months.  We will have her come in for a weight check to confirm her current weight.  The patient reports that she has not felt as though she has lost any weight.  If her weight truly is down that much we will need to evaluate further.

## 2019-09-18 DIAGNOSIS — Z20828 Contact with and (suspected) exposure to other viral communicable diseases: Secondary | ICD-10-CM | POA: Diagnosis not present

## 2019-09-19 DIAGNOSIS — I5032 Chronic diastolic (congestive) heart failure: Secondary | ICD-10-CM | POA: Diagnosis not present

## 2019-09-24 ENCOUNTER — Telehealth: Payer: Self-pay | Admitting: Family Medicine

## 2019-09-24 NOTE — Telephone Encounter (Signed)
Please find out if she is having any additional symptoms such as dysuria, frequency, or urgency.  Does she have any blood in her urine?  Does she have any other symptoms?  Strong odor and dark in color is not an indication to check her urine for UTI.  What time to they want to change the insulin to?

## 2019-09-24 NOTE — Telephone Encounter (Signed)
Internet lost connection  Urine is dark in color.. Also they had a question about changing the time of in insulin  There fax number is [phone]

## 2019-09-24 NOTE — Telephone Encounter (Signed)
Internet lost connection  Urine is dark in color.. Also they had a question about changing the time of in insulin  There fax number is [phone].  Laporscha Linehan,cma

## 2019-09-24 NOTE — Telephone Encounter (Signed)
Diane Mullins [phone] [address] Called to see if we can fax a lab order over for pt to have her urine checke  Strong odor

## 2019-09-25 ENCOUNTER — Ambulatory Visit: Payer: PPO

## 2019-09-25 ENCOUNTER — Other Ambulatory Visit: Payer: PPO

## 2019-09-25 ENCOUNTER — Telehealth: Payer: Self-pay

## 2019-09-25 DIAGNOSIS — R309 Painful micturition, unspecified: Secondary | ICD-10-CM

## 2019-09-25 NOTE — Telephone Encounter (Signed)
I called the facility and they stated they will drop the urine off here tomorrow they do not have a  lab and they will wait for the order for her levimir signed tomorrow but she took the verbal today. I will order the labs future.  Zoeann Mol,cma

## 2019-09-25 NOTE — Telephone Encounter (Signed)
Can they take a verbal order? I'm not in the office so would not be able to sign anything until tomorrow. You can give a verbal order to change the levemir to 12 pm dosing time. They can collect a urinalysis and urine micro. If she is having pain with urinating, urinary urgency or frequency then they can collect a culture.

## 2019-09-25 NOTE — Telephone Encounter (Signed)
I called and spoke with the nurse of the patient at the facility. Her urine has a strong odor and is dark, no frequency, no blood but there are things floating in the urine. Patient is having no other symptoms. The nurse stated that they  want to change the time of giving the patient her insulin to 12 pm daily and please D/C the previous order for insulin when changing the time.  Savon Cobbs,cma

## 2019-09-25 NOTE — Telephone Encounter (Signed)
See other note

## 2019-09-26 NOTE — Telephone Encounter (Signed)
Order signed. Given to [place].

## 2019-09-27 ENCOUNTER — Other Ambulatory Visit: Payer: Self-pay | Admitting: Family Medicine

## 2019-09-29 ENCOUNTER — Other Ambulatory Visit: Payer: Self-pay

## 2019-09-29 ENCOUNTER — Other Ambulatory Visit (INDEPENDENT_AMBULATORY_CARE_PROVIDER_SITE_OTHER): Payer: PPO

## 2019-09-29 DIAGNOSIS — R309 Painful micturition, unspecified: Secondary | ICD-10-CM | POA: Diagnosis not present

## 2019-09-29 LAB — URINALYSIS, ROUTINE W REFLEX MICROSCOPIC
Bilirubin Urine: NEGATIVE
Hgb urine dipstick: NEGATIVE
Ketones, ur: NEGATIVE
Leukocytes,Ua: NEGATIVE
Nitrite: NEGATIVE
RBC / HPF: NONE SEEN (ref 0–?)
Specific Gravity, Urine: 1.02 (ref 1.000–1.030)
Urine Glucose: NEGATIVE
Urobilinogen, UA: 0.2 (ref 0.0–1.0)
pH: 6 (ref 5.0–8.0)

## 2019-09-30 ENCOUNTER — Telehealth: Payer: Self-pay

## 2019-09-30 MED ORDER — MIRTAZAPINE 7.5 MG PO TABS: 7.5000 mg | ORAL_TABLET | Freq: Every day | ORAL | 2 refills | Status: DC

## 2019-09-30 NOTE — Telephone Encounter (Signed)
After several calls form the daughter of the patient and the facility she lives in I spoke with the daughter and informed her that the patient's results came back and she does not have a UTI, the provider will wait on the urine culture to come back before prescribing an antibiotic.  I also called the facility and relayed the same message to the nurse I also informed them that the depression medicaon was sent to the correct pharmacy..  Sarahelizabeth Conway,cma

## 2019-09-30 NOTE — Telephone Encounter (Signed)
Her urine dipstick is not overly convincing for UTI.  I would like to wait for her urine culture prior to prescribing any antibiotic.  Remeron sent to the correct pharmacy.

## 2019-09-30 NOTE — Telephone Encounter (Signed)
Falling is not an indication of a UTI. If she has been falling she needs to be evaluated to make sure nothing else is going on. There was not prior mention of falling. They previously reported dark urine and odor. Both of these are not necessarily indicative of a UTI. Please see if she is having pain with urination, urine frequency, or urine urgency. If she has developed those symptoms we could consider treatment for a UTI. Otherwise I  will await her culture results.

## 2019-09-30 NOTE — Telephone Encounter (Signed)
I spoke with the RN at the facility and informed her that the patient did not have a UTI but a culture was done and we are waiting on the results.  The patient is not having urine pain but she is weak and falling, I informed her that the patient needed to be evaluated in the ER or the urgent care because of those symptoms the nurse stated that they would take her to be evaluated.  Timtohy Broski,cma

## 2019-10-01 ENCOUNTER — Other Ambulatory Visit: Payer: Self-pay | Admitting: Family Medicine

## 2019-10-01 ENCOUNTER — Other Ambulatory Visit: Payer: Self-pay

## 2019-10-01 ENCOUNTER — Telehealth: Payer: Self-pay

## 2019-10-01 LAB — URINE CULTURE
MICRO NUMBER:: 10027899
SPECIMEN QUALITY:: ADEQUATE

## 2019-10-01 MED ORDER — CEPHALEXIN 500 MG PO CAPS: 500.0000 mg | ORAL_CAPSULE | Freq: Two times a day (BID) | ORAL | 0 refills | Status: DC

## 2019-10-01 NOTE — Telephone Encounter (Signed)
Faxed an order for the patient to take Remeron as prescribed to the facility today.  Malvina Schadler,cma

## 2019-10-01 NOTE — Telephone Encounter (Signed)
Noted and agree with need for evaluation.

## 2019-10-02 ENCOUNTER — Ambulatory Visit: Payer: PPO | Admitting: Lab

## 2019-10-02 ENCOUNTER — Other Ambulatory Visit (INDEPENDENT_AMBULATORY_CARE_PROVIDER_SITE_OTHER): Payer: PPO

## 2019-10-02 ENCOUNTER — Other Ambulatory Visit: Payer: Self-pay

## 2019-10-02 DIAGNOSIS — Z794 Long term (current) use of insulin: Secondary | ICD-10-CM | POA: Diagnosis not present

## 2019-10-02 DIAGNOSIS — R630 Anorexia: Secondary | ICD-10-CM

## 2019-10-02 DIAGNOSIS — N1831 Chronic kidney disease, stage 3a: Secondary | ICD-10-CM

## 2019-10-02 DIAGNOSIS — E1121 Type 2 diabetes mellitus with diabetic nephropathy: Secondary | ICD-10-CM | POA: Diagnosis not present

## 2019-10-02 DIAGNOSIS — Z20828 Contact with and (suspected) exposure to other viral communicable diseases: Secondary | ICD-10-CM | POA: Diagnosis not present

## 2019-10-02 LAB — COMPREHENSIVE METABOLIC PANEL
ALT: 13 U/L (ref 0–35)
AST: 17 U/L (ref 0–37)
Albumin: 3.6 g/dL (ref 3.5–5.2)
Alkaline Phosphatase: 111 U/L (ref 39–117)
BUN: 17 mg/dL (ref 6–23)
CO2: 30 mEq/L (ref 19–32)
Calcium: 8 mg/dL — ABNORMAL LOW (ref 8.4–10.5)
Chloride: 101 mEq/L (ref 96–112)
Creatinine, Ser: 2.1 mg/dL — ABNORMAL HIGH (ref 0.40–1.20)
GFR: 22.38 mL/min — ABNORMAL LOW (ref >60.00)
Glucose, Bld: 110 mg/dL — ABNORMAL HIGH (ref 70–99)
Potassium: 4.4 mEq/L (ref 3.5–5.1)
Sodium: 140 mEq/L (ref 135–145)
Total Bilirubin: 0.3 mg/dL (ref 0.2–1.2)
Total Protein: 6.2 g/dL (ref 6.0–8.3)

## 2019-10-02 LAB — HEMOGLOBIN A1C: Hgb A1c MFr Bld: 6.2 % (ref 4.6–6.5)

## 2019-10-02 LAB — TSH: TSH: 0.52 u[IU]/mL (ref 0.35–4.50)

## 2019-10-02 NOTE — Progress Notes (Signed)
Pt in office today to have weight checked. Pt weighed 230 lb in her wheel chair. Pt was unable to stand up and get weighed. Looked up [place] wheel chair 3 and the weight is 38 lbs.

## 2019-10-05 ENCOUNTER — Telehealth: Payer: Self-pay | Admitting: Family Medicine

## 2019-10-05 ENCOUNTER — Encounter: Payer: Self-pay | Admitting: Family Medicine

## 2019-10-05 NOTE — Telephone Encounter (Signed)
Please reach out to the patients daughter regarding her lab work. I called to advise ED evaluation for her kidney function though there was no answer on the number listed for the patient and no voicemail. I left a message for the daughter on her cell number, though could not leave details on that number per her DPR. I also sent a mychart message advising ED evaluation. Please follow-up with her to see if the patient went to the ED for evaluation. Thanks.

## 2019-10-05 NOTE — Progress Notes (Signed)
Weight appears to be relatively stable compare to her prior baseline. Her most recent weight may have been a spurious reading given how different it is.

## 2019-10-06 ENCOUNTER — Emergency Department: Payer: PPO

## 2019-10-06 ENCOUNTER — Telehealth: Payer: Self-pay

## 2019-10-06 ENCOUNTER — Other Ambulatory Visit: Payer: Self-pay

## 2019-10-06 ENCOUNTER — Emergency Department
Admission: EM | Admit: 2019-10-06 | Discharge: 2019-10-06 | Disposition: A | Discharge status: Skilled Nursing Facility | Payer: PPO | Attending: Student in an Organized Health Care Education/Training Program | Admitting: Student in an Organized Health Care Education/Training Program

## 2019-10-06 DIAGNOSIS — I5032 Chronic diastolic (congestive) heart failure: Secondary | ICD-10-CM | POA: Insufficient documentation

## 2019-10-06 DIAGNOSIS — E039 Hypothyroidism, unspecified: Secondary | ICD-10-CM | POA: Insufficient documentation

## 2019-10-06 DIAGNOSIS — I110 Hypertensive heart disease with heart failure: Secondary | ICD-10-CM | POA: Insufficient documentation

## 2019-10-06 DIAGNOSIS — R609 Edema, unspecified: Secondary | ICD-10-CM | POA: Diagnosis not present

## 2019-10-06 DIAGNOSIS — Z743 Need for continuous supervision: Secondary | ICD-10-CM | POA: Diagnosis not present

## 2019-10-06 DIAGNOSIS — E860 Dehydration: Secondary | ICD-10-CM | POA: Diagnosis not present

## 2019-10-06 DIAGNOSIS — E119 Type 2 diabetes mellitus without complications: Secondary | ICD-10-CM | POA: Insufficient documentation

## 2019-10-06 DIAGNOSIS — R279 Unspecified lack of coordination: Secondary | ICD-10-CM | POA: Diagnosis not present

## 2019-10-06 DIAGNOSIS — J811 Chronic pulmonary edema: Secondary | ICD-10-CM | POA: Diagnosis not present

## 2019-10-06 DIAGNOSIS — R7989 Other specified abnormal findings of blood chemistry: Secondary | ICD-10-CM | POA: Diagnosis not present

## 2019-10-06 DIAGNOSIS — Z8673 Personal history of transient ischemic attack (TIA), and cerebral infarction without residual deficits: Secondary | ICD-10-CM | POA: Diagnosis not present

## 2019-10-06 DIAGNOSIS — R531 Weakness: Secondary | ICD-10-CM | POA: Diagnosis not present

## 2019-10-06 DIAGNOSIS — I10 Essential (primary) hypertension: Secondary | ICD-10-CM | POA: Diagnosis not present

## 2019-10-06 DIAGNOSIS — G459 Transient cerebral ischemic attack, unspecified: Secondary | ICD-10-CM | POA: Diagnosis not present

## 2019-10-06 HISTORY — DX: Unspecified hearing loss, unspecified ear: H91.90

## 2019-10-06 LAB — CBC
HCT: 40.2 % (ref 36.0–46.0)
Hemoglobin: 12.6 g/dL (ref 12.0–15.0)
MCH: 29 pg (ref 26.0–34.0)
MCHC: 31.3 g/dL (ref 30.0–36.0)
MCV: 92.4 fL (ref 80.0–100.0)
Platelets: 234 10*3/uL (ref 150–400)
RBC: 4.35 MIL/uL (ref 3.87–5.11)
RDW: 15.2 % (ref 11.5–15.5)
WBC: 5.5 10*3/uL (ref 4.0–10.5)
nRBC: 0 % (ref 0.0–0.2)

## 2019-10-06 LAB — COMPREHENSIVE METABOLIC PANEL
ALT: 29 U/L (ref 0–44)
AST: 54 U/L — ABNORMAL HIGH (ref 15–41)
Albumin: 3.4 g/dL — ABNORMAL LOW (ref 3.5–5.0)
Alkaline Phosphatase: 136 U/L — ABNORMAL HIGH (ref 38–126)
Anion gap: 10 (ref 5–15)
BUN: 17 mg/dL (ref 8–23)
CO2: 30 mmol/L (ref 22–32)
Calcium: 7.9 mg/dL — ABNORMAL LOW (ref 8.9–10.3)
Chloride: 101 mmol/L (ref 98–111)
Creatinine, Ser: 2.02 mg/dL — ABNORMAL HIGH (ref 0.44–1.00)
GFR calc Af Amer: 25 mL/min — ABNORMAL LOW (ref >60)
GFR calc non Af Amer: 22 mL/min — ABNORMAL LOW (ref >60)
Glucose, Bld: 122 mg/dL — ABNORMAL HIGH (ref 70–99)
Potassium: 4.1 mmol/L (ref 3.5–5.1)
Sodium: 141 mmol/L (ref 135–145)
Total Bilirubin: 0.5 mg/dL (ref 0.3–1.2)
Total Protein: 6.8 g/dL (ref 6.5–8.1)

## 2019-10-06 LAB — URINALYSIS, COMPLETE (UACMP) WITH MICROSCOPIC
Bilirubin Urine: NEGATIVE
Glucose, UA: 50 mg/dL — AB
Ketones, ur: NEGATIVE mg/dL
Nitrite: NEGATIVE
Protein, ur: NEGATIVE mg/dL
Specific Gravity, Urine: 1.011 (ref 1.005–1.030)
pH: 5 (ref 5.0–8.0)

## 2019-10-06 LAB — LIPASE, BLOOD: Lipase: 26 U/L (ref 11–51)

## 2019-10-06 MED ORDER — SODIUM CHLORIDE 0.9% FLUSH: 3.0000 mL | Freq: Once | INTRAVENOUS | Status: DC

## 2019-10-06 MED ORDER — SODIUM CHLORIDE 0.9 % IV BOLUS: 500.0000 mL | Freq: Once | INTRAVENOUS | Status: DC

## 2019-10-06 MED ORDER — SODIUM CHLORIDE 0.9 % IV BOLUS
250.0000 mL | Freq: Once | INTRAVENOUS | Status: AC
  Administered 2019-10-06: 250 mL via INTRAVENOUS

## 2019-10-06 NOTE — ED Provider Notes (Addendum)
[facility] Emergency Department Provider Note    First MD Initiated Contact with Patient 10/06/19 1548     (approximate)  I have reviewed the triage vital signs and the nursing notes.   HISTORY  Chief Complaint Abnormal Lab    HPI Diane Mullins is a 86 y.o. female below listed past medical history presents the ER for feeling weak and dehydrated.  Patient is recent on a course of antibiotics.  Had routine blood work 4 days ago that showed elevated creatinine.  States she does not feel dehydrated right now.  Denies any chest pain or shortness of breath.  Patient is uncertain as to why she was sent to the ER.  Denies any nausea or vomiting. Family does report a recent fall.  No neuro imaging obtained after that fall.  They feel she has deteriorated since then but wonder if it could have been related to UTI.  Reportedly those symptoms are improving.     Past Medical History:  Diagnosis Date  . Atrial fibrillation ([place])   . Benign paroxysmal positional vertigo 07/17/2014  . Chronic diastolic CHF (congestive heart failure) ([place])    a. echo 10/19: EF of 60-65%, no RWMA, Gr2DD, calcified mitral annulus with mild MR, mildly to moderately dilated left atrium, moderate TR, PASP 52 mmHg  . Diabetes mellitus   . H/O: rheumatic fever   . HOH (hard of hearing)   . Hyperlipidemia   . Hypertension   . Pulmonary hypertension ([place])   . Stroke [facility])    TIA Jan. 1st  . Teratoma of left ovary   . Thyroid disease    Family History  Problem Relation Age of Onset  . Heart disease Mother 107  . Heart attack Mother 19  . Cancer Father        colon  . Diabetes Sister   . Hypertension Sister   . Cancer Brother        kidney  . Diabetes Brother   . Cancer Daughter        ovarian  . Non-Hodgkin's lymphoma Son    Past Surgical History:  Procedure Laterality Date  . ABDOMINAL HYSTERECTOMY  1973   menorrhagia  . CARDIOVERSION N/A 10/22/2018   Procedure: CARDIOVERSION;   Surgeon: Nelva Bush, MD;  Location: [facility] ORS;  Service: Cardiovascular;  Laterality: N/A;  . CARDIOVERSION N/A 11/26/2018   Procedure: CARDIOVERSION (CATH LAB);  Surgeon: Minna Merritts, MD;  Location: [facility] ORS;  Service: Cardiovascular;  Laterality: N/A;  . COLONOSCOPY WITH PROPOFOL N/A 12/16/2018   Procedure: COLONOSCOPY WITH PROPOFOL;  Surgeon: Lin Landsman, MD;  Location[facility] ENDOSCOPY;  Service: Gastroenterology;  Laterality: N/A;  . ENTEROSCOPY N/A 12/16/2018   Procedure: Push ENTEROSCOPY;  Surgeon: Lin Landsman, MD;  Location: [facility] ENDOSCOPY;  Service: Gastroenterology;  Laterality: N/A;  . ESOPHAGOGASTRODUODENOSCOPY N/A 06/18/2019   Procedure: ESOPHAGOGASTRODUODENOSCOPY (EGD);  Surgeon: Lin Landsman, MD;  Location: [facility] ENDOSCOPY;  Service: Gastroenterology;  Laterality: N/A;  . ESOPHAGOGASTRODUODENOSCOPY (EGD) WITH PROPOFOL N/A 12/14/2018   Procedure: ESOPHAGOGASTRODUODENOSCOPY (EGD) WITH PROPOFOL;  Surgeon: Lucilla Lame, MD;  Location: [facility] ENDOSCOPY;  Service: Endoscopy;  Laterality: N/A;  . GIVENS CAPSULE STUDY N/A 06/18/2019   Procedure: GIVENS CAPSULE STUDY;  Surgeon: Lin Landsman, MD;  Location: [facility] ENDOSCOPY;  Service: Gastroenterology;  Laterality: N/A;  . HERNIA REPAIR  1994  . ORIF ANKLE FRACTURE Right 10/29/2015   Procedure: OPEN REDUCTION INTERNAL FIXATION (ORIF) ANKLE FRACTURE;  Surgeon: Thornton Park, MD;  Location:  [facility] ORS;  Service: Orthopedics;  Laterality: Right;  . TEE WITHOUT CARDIOVERSION N/A 10/22/2018   Procedure: TRANSESOPHAGEAL ECHOCARDIOGRAM (TEE);  Surgeon: Nelva Bush, MD;  Location: [facility] ORS;  Service: Cardiovascular;  Laterality: N/A;  . VAGINAL DELIVERY     x4   Patient Active Problem List   Diagnosis Date Noted  . Decreased appetite 09/10/2019  . Depression, major, single episode, moderate ([place]) 09/10/2019  . UTI (urinary tract infection) 07/09/2019  . Generalized weakness 06/23/2019  . GI bleed 06/16/2019  .  B12 deficiency 04/06/2019  . Balance problem 04/06/2019  . Gastrointestinal hemorrhage with melena   . Symptomatic anemia   . Melena 12/11/2018  . Constipation 12/10/2018  . Atrial flutter ( [place]) 10/17/2018  . 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 AKI (acute kidney injury) ([place])   . Acute on chronic diastolic heart failure ([place])   . Respiratory failure ([place]) 07/05/2018  . Coronary artery calcification seen on CAT scan 11/16/2017  . Morbid obesity ([place]) 10/30/2017  . (HFpEF) heart failure with preserved ejection fraction ([place]) 09/28/2017  . Family history of colon cancer 04/17/2017  . Closed right ankle fracture 10/28/2015  . Fatigue 10/18/2015  . TIA (transient ischemic attack) 09/18/2015  . Orthostatic hypotension 08/02/2015  . Adjustment disorder with anxious mood 03/30/2015  . Postmenopausal estrogen deficiency 06/03/2014  . DNR (do not resuscitate) discussion 03/04/2014  . Gait disturbance 05/07/2013  . Diabetes ([place]) 04/21/2013  . Shortness of breath 12/09/2012  . Hearing loss 12/09/2012  . Anxiety 01/31/2012  . Hypertension 12/22/2011  . Hyperlipidemia 12/22/2011  . Hypothyroidism 12/22/2011      Prior to Admission medications   Medication Sig Start Date End Date Taking? Authorizing Provider  acetaminophen (TYLENOL) 325 MG tablet Take 650 mg by mouth every 6 (six) hours as needed for mild pain or moderate pain.     [provider]  amiodarone (PACERONE) 200 MG tablet TAKE 1 TABLET BY MOUTH EVERY DAY 09/29/19   [name], MD  carvedilol (COREG) 3.125 MG tablet Take 1 tablet (3.125 mg total) by mouth 2 (two) times daily. Hold for heart rate less than 60 08/19/19 08/18/20  [name], MD  cephALEXin (KEFLEX) 500 MG capsule Take 1 capsule (500 mg total) by mouth 2 (two) times daily. 10/01/19   [name], MD  cyanocobalamin (,VITAMIN B-12,) 1000 MCG/ML injection Inject 1,000 mcg into the muscle every 30 (thirty) days.    [provider]    cyanocobalamin ([employer] VITAMIN B12) 1000 MCG tablet Take 1 tablet (1,000 mcg total) by mouth daily. 08/19/19   [name], MD  ferrous sulfate (FEROSUL) 325 (65 FE) MG tablet Take 1 tablet (325 mg total) by mouth daily. 07/14/19   [name], MD  furosemide (LASIX) 40 MG tablet Take 0.5 tablets (20 mg total) by mouth daily. 07/30/19 10/28/19  [name], MD  Insulin Detemir (LEVEMIR FLEXTOUCH) 100 UNIT/ML Pen Inject 6 Units into the skin daily. Can use abdominal and outer thigh subcutaneous tissue. 09/08/19 12/07/19  [name], MD  levothyroxine (SYNTHROID) 150 MCG tablet Take 1 tablet (150 mcg total) by mouth daily before breakfast. 07/30/19 01/26/20  [name], MD  meclizine (ANTIVERT) 25 MG tablet Take 25 mg by mouth daily as needed for dizziness.    [provider]  metFORMIN (GLUCOPHAGE) 1000 MG tablet Take 1 tablet (1,000 mg total) by mouth 2 (two) times daily with a meal. TAKE 1 TABLET TWICE DAILY  WITH  A  MEAL 07/30/19   [name], MD  mirtazapine (REMERON) 7.5 MG tablet Take 1 tablet (7.5 mg total) by mouth at bedtime. 09/30/19   [name], MD  ondansetron (ZOFRAN) 4 MG tablet Take 4 mg by mouth every 8 (eight) hours as needed for nausea. 12/04/18   [provider]  ondansetron (ZOFRAN-ODT) 4 MG disintegrating tablet TAKE 1 TABLET BY MOUTH EVERY 8 HOURS AS NEEDED FOR NAUSEA OR VOMITTING 07/30/19   [name], MD  pantoprazole (PROTONIX) 40 MG tablet Take 1 tablet (40 mg total) by mouth daily. 07/30/19 01/26/20  [name], MD  polyethylene glycol (MIRALAX / Floria Raveling) packet Take 17 g by mouth daily as needed for moderate constipation.     [provider]  potassium chloride (KLOR-CON) 10 MEQ tablet Take 1 tablet (10 mEq total) by mouth daily. 08/19/19   [name], MD  simvastatin (ZOCOR) 20 MG tablet TAKE 1 TABLET BY MOUTH EACH NIGHT AT BEDTIME FOR CHOLESTEROL 07/30/19   [name], MD   TRUE METRIX BLOOD GLUCOSE TEST test strip CHECK FASTING BLOOD SUGAR EVERY MORNING 08/29/19   [name], MD    Allergies Oxycodone    Social History Social History   Tobacco Use  . Smoking status: Never Smoker  . Smokeless tobacco: Never Used  Substance Use Topics  . Alcohol use: No  . Drug use: No    Review of Systems Patient denies headaches, rhinorrhea, blurry vision, numbness, shortness of breath, chest pain, edema, cough, abdominal pain, nausea, vomiting, diarrhea, dysuria, fevers, rashes or hallucinations unless otherwise stated above in HPI. ____________________________________________   PHYSICAL EXAM:  VITAL SIGNS: Vitals:   10/06/19 1446 10/06/19 1447  BP: (!) 144/70   Pulse: 69   Resp:    Temp:  97.8 F (36.6 C)  SpO2: 96%     Constitutional: Alert, pleasant cooperative and in NAD Eyes: Conjunctivae are normal.  Head: Atraumatic. Nose: No congestion/rhinnorhea. Mouth/Throat: Mucous membranes are moist.   Neck: No stridor. Painless ROM.  Cardiovascular: Normal rate, regular rhythm. Grossly normal heart sounds.  Good peripheral circulation. Respiratory: Normal respiratory effort.  No retractions. Lungs with coarse bibasilar BS Gastrointestinal: Soft and nontender. No distention. No abdominal bruits. No CVA tenderness. Genitourinary:  Musculoskeletal: No lower extremity tenderness nor edema.  No joint effusions. Neurologic:  Normal speech and language. No gross focal neurologic deficits are appreciated. No facial droop Skin:  Skin is warm, dry and intact. No rash noted. Psychiatric: Mood and affect are normal. Speech and behavior are normal.  ____________________________________________   LABS (all labs ordered are listed, but only abnormal results are displayed)  Results for orders placed or performed during the hospital encounter of 10/06/19 (from the past 24 hour(s))  Lipase, blood     Status: None   Collection Time: 10/06/19  2:47 PM   Result Value Ref Range   Lipase 26 11 - 51 U/L  Comprehensive metabolic panel     Status: Abnormal   Collection Time: 10/06/19  2:47 PM  Result Value Ref Range   Sodium 141 135 - 145 mmol/L   Potassium 4.1 3.5 - 5.1 mmol/L   Chloride 101 98 - 111 mmol/L   CO2 30 22 - 32 mmol/L   Glucose, Bld 122 (H) 70 - 99 mg/dL   BUN 17 8 - 23 mg/dL   Creatinine, Ser 2.02 (H) 0.44 - 1.00 mg/dL   Calcium 7.9 (L) 8.9 - 10.3 mg/dL   Total  Protein 6.8 6.5 - 8.1 g/dL   Albumin 3.4 (L) 3.5 - 5.0 g/dL   AST 54 (H) 15 - 41 U/L   ALT 29 0 - 44 U/L   Alkaline Phosphatase 136 (H) 38 - 126 U/L   Total Bilirubin 0.5 0.3 - 1.2 mg/dL   GFR calc non Af Amer 22 (L) >60 mL/min   GFR calc Af Amer 25 (L) >60 mL/min   Anion gap 10 5 - 15  CBC     Status: None   Collection Time: 10/06/19  2:47 PM  Result Value Ref Range   WBC 5.5 4.0 - 10.5 K/uL   RBC 4.35 3.87 - 5.11 MIL/uL   Hemoglobin 12.6 12.0 - 15.0 g/dL   HCT 40.2 36.0 - 46.0 %   MCV 92.4 80.0 - 100.0 fL   MCH 29.0 26.0 - 34.0 pg   MCHC 31.3 30.0 - 36.0 g/dL   RDW 15.2 11.5 - 15.5 %   Platelets 234 150 - 400 K/uL   nRBC 0.0 0.0 - 0.2 %  Urinalysis, Complete w Microscopic     Status: Abnormal   Collection Time: 10/06/19  2:47 PM  Result Value Ref Range   Color, Urine YELLOW (A) YELLOW   APPearance HAZY (A) CLEAR   Specific Gravity, Urine 1.011 1.005 - 1.030   pH 5.0 5.0 - 8.0   Glucose, UA 50 (A) NEGATIVE mg/dL   Hgb urine dipstick MODERATE (A) NEGATIVE   Bilirubin Urine NEGATIVE NEGATIVE   Ketones, ur NEGATIVE NEGATIVE mg/dL   Protein, ur NEGATIVE NEGATIVE mg/dL   Nitrite NEGATIVE NEGATIVE   Leukocytes,Ua SMALL (A) NEGATIVE   RBC / HPF 0-5 0 - 5 RBC/hpf   WBC, UA 21-50 0 - 5 WBC/hpf   Bacteria, UA FEW (A) NONE SEEN   Squamous Epithelial / LPF 11-20 0 - 5   Mucus PRESENT    ____________________________________________ ____________________________________________  RADIOLOGY  I personally reviewed all radiographic images ordered to  evaluate for the above acute complaints and reviewed radiology reports and findings.  These findings were personally discussed with the patient.  Please see medical record for radiology report.  ____________________________________________   PROCEDURES  Procedure(s) performed:  Procedures    Critical Care performed: no ____________________________________________   INITIAL IMPRESSION / ASSESSMENT AND PLAN / ED COURSE  Pertinent labs & imaging results that were available during my care of the patient were reviewed by me and considered in my medical decision making (see chart for details).   DDX: AKI, CKD, dehydration, electrolyte abnormality, anemia, deconditioning  Diane Mullins is a 86 y.o. who presents to the ED with symptoms as described above.  No acute distress.  Patient denies any discomfort at this time.  Abdominal exam soft benign.  No focal neuro deficits.  Given report of recent fall imaging of the head ordered to evaluate for traumatic injury shows no acute abnormality.  Does not seem consistent with TIA or CVA.  Urinalysis does appear improved and symptoms otherwise improving from UTI.  Will give IV fluid bolus for possible dehydration.  Creatinine is mildly increased but is already improving from 4 days ago.  Patient tolerating oral hydration.  No hypoxia.  Does appear appropriate for outpatient follow-up.     The patient was evaluated in Emergency Department today for the symptoms described in the history of present illness. He/she was evaluated in the context of the global COVID-19 pandemic, which necessitated consideration that the patient might be at risk for infection with  the SARS-CoV-2 virus that causes COVID-19. Institutional protocols and algorithms that pertain to the evaluation of patients at risk for COVID-19 are in a state of rapid change based on information released by regulatory bodies including the CDC and federal and state organizations. These policies and  algorithms were followed during the patient's care in the ED.  As part of my medical decision making, I reviewed the following data within the [facility] notes reviewed and incorporated, Labs reviewed, notes from prior ED visits and [place] Controlled Substance Database   ____________________________________________   FINAL CLINICAL IMPRESSION(S) / ED DIAGNOSES  Final diagnoses:  Elevated serum creatinine      NEW MEDICATIONS STARTED DURING THIS VISIT:  New Prescriptions   No medications on file     Note:  This document was prepared using Dragon voice recognition software and may include unintentional dictation errors.    Merlyn Lot, MD 10/06/19 1806    Merlyn Lot, MD 10/06/19 [phone]

## 2019-10-06 NOTE — ED Triage Notes (Signed)
Pt in via EMS from the [facility].  EMS reports her MD ordered her to come to the ED and get fluids for dehydration. EMS notes pitting edema to lower extremities.

## 2019-10-06 NOTE — ED Notes (Signed)
Patient given crackers and water. Eating and drinking without difficulty at this time.

## 2019-10-06 NOTE — Progress Notes (Signed)
Spoke with patient daughter and she is going to call [facility] and have them get her ready so she can take her to the hospital.

## 2019-10-06 NOTE — Telephone Encounter (Signed)
Patient daughter taking her to ER she calling facility to get patient ready.

## 2019-10-06 NOTE — Telephone Encounter (Signed)
I received a call from the front desk Santiago Glad) stating she had EMS on the phone and they  wanted an verbal order to give the patient fluid, I stopped the provider on a call and informed him and he looked at the patients chart and informed me that if she needed fluid they should give it to her, I called and informed Santiago Glad of this  And she stated this is so she would not have to go to the hospital and she still had them on hold  I never knew they were not taking her to the hospital until after I informed the provider.     about 10 min's later she call me back and asked how much fluid to give the patient and I told he no, If they are Ems they need to take her to the hospital I would not ask the provider for that information that should be handled by them.  Diane Mullins,cma

## 2019-10-06 NOTE — Discharge Instructions (Signed)
Please be sure to drink plenty of fluids for the next two days.  Follow up with PCP for repeat blood work to make sure your creatinine level continues to improve.

## 2019-10-06 NOTE — ED Notes (Signed)
Voicemail left with patient's daughter, [name], regarding patient's pending discharge. Asked for return call to clarify transportation for discharge.

## 2019-10-06 NOTE — ED Triage Notes (Signed)
See previous triage note. Pt has been treated for recent UTI and had some abnormal labs recently. Pt is alert on arrival and oriented to situation. Denies pain, states she has had vomiting and diarrhea. Pt is HOH

## 2019-10-09 DIAGNOSIS — Z20828 Contact with and (suspected) exposure to other viral communicable diseases: Secondary | ICD-10-CM | POA: Diagnosis not present

## 2019-10-13 ENCOUNTER — Other Ambulatory Visit: Payer: Self-pay

## 2019-10-13 ENCOUNTER — Emergency Department
Admission: EM | Admit: 2019-10-13 | Discharge: 2019-10-14 | Disposition: A | Discharge status: Home / Self Care | Payer: PPO | Attending: Emergency Medicine | Admitting: Emergency Medicine

## 2019-10-13 ENCOUNTER — Emergency Department: Payer: PPO

## 2019-10-13 DIAGNOSIS — Z79899 Other long term (current) drug therapy: Secondary | ICD-10-CM | POA: Insufficient documentation

## 2019-10-13 DIAGNOSIS — M25532 Pain in left wrist: Secondary | ICD-10-CM | POA: Diagnosis not present

## 2019-10-13 DIAGNOSIS — E039 Hypothyroidism, unspecified: Secondary | ICD-10-CM | POA: Insufficient documentation

## 2019-10-13 DIAGNOSIS — I2510 Atherosclerotic heart disease of native coronary artery without angina pectoris: Secondary | ICD-10-CM | POA: Diagnosis not present

## 2019-10-13 DIAGNOSIS — Y999 Unspecified external cause status: Secondary | ICD-10-CM | POA: Insufficient documentation

## 2019-10-13 DIAGNOSIS — Y92129 Unspecified place in nursing home as the place of occurrence of the external cause: Secondary | ICD-10-CM | POA: Diagnosis not present

## 2019-10-13 DIAGNOSIS — Z794 Long term (current) use of insulin: Secondary | ICD-10-CM | POA: Insufficient documentation

## 2019-10-13 DIAGNOSIS — W050XXA Fall from non-moving wheelchair, initial encounter: Secondary | ICD-10-CM | POA: Diagnosis not present

## 2019-10-13 DIAGNOSIS — Z23 Encounter for immunization: Secondary | ICD-10-CM | POA: Insufficient documentation

## 2019-10-13 DIAGNOSIS — Y939 Activity, unspecified: Secondary | ICD-10-CM | POA: Diagnosis not present

## 2019-10-13 DIAGNOSIS — S0990XA Unspecified injury of head, initial encounter: Secondary | ICD-10-CM | POA: Insufficient documentation

## 2019-10-13 DIAGNOSIS — M25539 Pain in unspecified wrist: Secondary | ICD-10-CM | POA: Diagnosis not present

## 2019-10-13 DIAGNOSIS — I110 Hypertensive heart disease with heart failure: Secondary | ICD-10-CM | POA: Diagnosis not present

## 2019-10-13 DIAGNOSIS — I5032 Chronic diastolic (congestive) heart failure: Secondary | ICD-10-CM | POA: Insufficient documentation

## 2019-10-13 DIAGNOSIS — E119 Type 2 diabetes mellitus without complications: Secondary | ICD-10-CM | POA: Diagnosis not present

## 2019-10-13 DIAGNOSIS — S6992XA Unspecified injury of left wrist, hand and finger(s), initial encounter: Secondary | ICD-10-CM | POA: Diagnosis not present

## 2019-10-13 DIAGNOSIS — Z8673 Personal history of transient ischemic attack (TIA), and cerebral infarction without residual deficits: Secondary | ICD-10-CM | POA: Insufficient documentation

## 2019-10-13 DIAGNOSIS — W19XXXA Unspecified fall, initial encounter: Secondary | ICD-10-CM | POA: Diagnosis not present

## 2019-10-13 DIAGNOSIS — I10 Essential (primary) hypertension: Secondary | ICD-10-CM | POA: Diagnosis not present

## 2019-10-13 DIAGNOSIS — R519 Headache, unspecified: Secondary | ICD-10-CM | POA: Diagnosis not present

## 2019-10-13 DIAGNOSIS — S199XXA Unspecified injury of neck, initial encounter: Secondary | ICD-10-CM | POA: Diagnosis not present

## 2019-10-13 LAB — CBC WITH DIFFERENTIAL/PLATELET
Abs Immature Granulocytes: 0.02 10*3/uL (ref 0.00–0.07)
Basophils Absolute: 0.1 10*3/uL (ref 0.0–0.1)
Basophils Relative: 1 %
Eosinophils Absolute: 0 10*3/uL (ref 0.0–0.5)
Eosinophils Relative: 1 %
HCT: 39.7 % (ref 36.0–46.0)
Hemoglobin: 12.5 g/dL (ref 12.0–15.0)
Immature Granulocytes: 0 %
Lymphocytes Relative: 27 %
Lymphs Abs: 2 10*3/uL (ref 0.7–4.0)
MCH: 28.9 pg (ref 26.0–34.0)
MCHC: 31.5 g/dL (ref 30.0–36.0)
MCV: 91.9 fL (ref 80.0–100.0)
Monocytes Absolute: 0.9 10*3/uL (ref 0.1–1.0)
Monocytes Relative: 12 %
Neutro Abs: 4.3 10*3/uL (ref 1.7–7.7)
Neutrophils Relative %: 59 %
Platelets: 216 10*3/uL (ref 150–400)
RBC: 4.32 MIL/uL (ref 3.87–5.11)
RDW: 14.9 % (ref 11.5–15.5)
WBC: 7.4 10*3/uL (ref 4.0–10.5)
nRBC: 0 % (ref 0.0–0.2)

## 2019-10-13 LAB — BASIC METABOLIC PANEL
Anion gap: 11 (ref 5–15)
BUN: 20 mg/dL (ref 8–23)
CO2: 28 mmol/L (ref 22–32)
Calcium: 8.1 mg/dL — ABNORMAL LOW (ref 8.9–10.3)
Chloride: 102 mmol/L (ref 98–111)
Creatinine, Ser: 2.01 mg/dL — ABNORMAL HIGH (ref 0.44–1.00)
GFR calc Af Amer: 26 mL/min — ABNORMAL LOW (ref >60)
GFR calc non Af Amer: 22 mL/min — ABNORMAL LOW (ref >60)
Glucose, Bld: 150 mg/dL — ABNORMAL HIGH (ref 70–99)
Potassium: 4.5 mmol/L (ref 3.5–5.1)
Sodium: 141 mmol/L (ref 135–145)

## 2019-10-13 MED ORDER — TETANUS-DIPHTH-ACELL PERTUSSIS 5-2.5-18.5 LF-MCG/0.5 IM SUSP
0.5000 mL | Freq: Once | INTRAMUSCULAR | Status: AC
  Administered 2019-10-13: 22:00:00 0.5 mL via INTRAMUSCULAR
  Filled 2019-10-13: qty 0.5

## 2019-10-13 MED ORDER — ACETAMINOPHEN 500 MG PO TABS
1000.0000 mg | ORAL_TABLET | Freq: Once | ORAL | Status: AC
  Administered 2019-10-13: 1000 mg via ORAL
  Filled 2019-10-13: qty 2

## 2019-10-13 NOTE — ED Notes (Signed)
Patient transported to CT by CT staff

## 2019-10-13 NOTE — ED Provider Notes (Signed)
[facility] Emergency Department Provider Note  ____________________________________________   First MD Initiated Contact with Patient 10/13/19 2113     (approximate)  I have reviewed the triage vital signs and the nursing notes.   HISTORY  Chief Complaint Fall    HPI Diane Mullins is a 86 y.o. female with A. fib, CHF, hypertension, hyperlipidemia who comes in for mechanical fall.  States that she was in her wheelchair when she leaned over to pick up a towel and she fell over.  Patient states she hit her head as well as her left wrist.  Her head is having a mild headache, constant, nothing makes it better, nothing makes it worse.  Also a little bit of pain to her left wrist.  Denies any chest pain, shortness of breath, abdominal pain, leg pain.  Otherwise she feels at her baseline self.  Patient medical list does state she is on a blood thinner but patient states that she has not been taking it.          Past Medical History:  Diagnosis Date  . Atrial fibrillation ([place])   . Benign paroxysmal positional vertigo 07/17/2014  . Chronic diastolic CHF (congestive heart failure) ([place])    a. echo 10/19: EF of 60-65%, no RWMA, Gr2DD, calcified mitral annulus with mild MR, mildly to moderately dilated left atrium, moderate TR, PASP 52 mmHg  . Diabetes mellitus   . H/O: rheumatic fever   . HOH (hard of hearing)   . Hyperlipidemia   . Hypertension   . Pulmonary hypertension ([place])   . Stroke [facility])    TIA Jan. 1st  . Teratoma of left ovary   . Thyroid disease     Patient Active Problem List   Diagnosis Date Noted  . Decreased appetite 09/10/2019  . Depression, major, single episode, moderate ([place]) 09/10/2019  . UTI (urinary tract infection) 07/09/2019  . Generalized weakness 06/23/2019  . GI bleed 06/16/2019  . B12 deficiency 04/06/2019  . Balance problem 04/06/2019  . Gastrointestinal hemorrhage with melena   . Symptomatic anemia   . Melena  12/11/2018  . Constipation 12/10/2018  . Atrial flutter ([place]) 10/17/2018  . 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 AKI (acute kidney injury) ([place])   . Acute on chronic diastolic heart failure ([place])   . Respiratory failure ([place]) 07/05/2018  . Coronary artery calcification seen on CAT scan 11/16/2017  . Morbid obesity ([place]) 10/30/2017  . (HFpEF) heart failure with preserved ejection fraction ([place]) 09/28/2017  . Family history of colon cancer 04/17/2017  . Closed right ankle fracture 10/28/2015  . Fatigue 10/18/2015  . TIA (transient ischemic attack) 09/18/2015  . Orthostatic hypotension 08/02/2015  . Adjustment disorder with anxious mood 03/30/2015  . Postmenopausal estrogen deficiency 06/03/2014  . DNR (do not resuscitate) discussion 03/04/2014  . Gait disturbance 05/07/2013  . Diabetes ([place]) 04/21/2013  . Shortness of breath 12/09/2012  . Hearing loss 12/09/2012  . Anxiety 01/31/2012  . Hypertension 12/22/2011  . Hyperlipidemia 12/22/2011  . Hypothyroidism 12/22/2011    Past Surgical History:  Procedure Laterality Date  . ABDOMINAL HYSTERECTOMY  1973   menorrhagia  . CARDIOVERSION N/A 10/22/2018   Procedure: CARDIOVERSION;  Surgeon: Nelva Bush, MD;  Location: [facility] ORS;  Service: Cardiovascular;  Laterality: N/A;  . CARDIOVERSION N/A 11/26/2018   Procedure: CARDIOVERSION (CATH LAB);  Surgeon: Minna Merritts, MD;  Location: [facility] ORS;  Service: Cardiovascular;  Laterality: N/A;  . COLONOSCOPY WITH PROPOFOL N/A 12/16/2018  Procedure: COLONOSCOPY WITH PROPOFOL;  Surgeon: Lin Landsman, MD;  Location: [facility] ENDOSCOPY;  Service: Gastroenterology;  Laterality: N/A;  . ENTEROSCOPY N/A 12/16/2018   Procedure: Push ENTEROSCOPY;  Surgeon: Lin Landsman, MD;  Location: [facility] ENDOSCOPY;  Service: Gastroenterology;  Laterality: N/A;  . ESOPHAGOGASTRODUODENOSCOPY N/A 06/18/2019   Procedure: ESOPHAGOGASTRODUODENOSCOPY (EGD);  Surgeon: Lin Landsman, MD;  Location: [facility]  ENDOSCOPY;  Service: Gastroenterology;  Laterality: N/A;  . ESOPHAGOGASTRODUODENOSCOPY (EGD) WITH PROPOFOL N/A 12/14/2018   Procedure: ESOPHAGOGASTRODUODENOSCOPY (EGD) WITH PROPOFOL;  Surgeon: Lucilla Lame, MD;  Location: [facility] ENDOSCOPY;  Service: Endoscopy;  Laterality: N/A;  . GIVENS CAPSULE STUDY N/A 06/18/2019   Procedure: GIVENS CAPSULE STUDY;  Surgeon: Lin Landsman, MD;  Location: [facility] ENDOSCOPY;  Service: Gastroenterology;  Laterality: N/A;  . HERNIA REPAIR  1994  . ORIF ANKLE FRACTURE Right 10/29/2015   Procedure: OPEN REDUCTION INTERNAL FIXATION (ORIF) ANKLE FRACTURE;  Surgeon: Thornton Park, MD;  Location: [facility] ORS;  Service: Orthopedics;  Laterality: Right;  . TEE WITHOUT CARDIOVERSION N/A 10/22/2018   Procedure: TRANSESOPHAGEAL ECHOCARDIOGRAM (TEE);  Surgeon: Nelva Bush, MD;  Location: [facility] ORS;  Service: Cardiovascular;  Laterality: N/A;  . VAGINAL DELIVERY     x4    Prior to Admission medications   Medication Sig Start Date End Date Taking? Authorizing Provider  acetaminophen (TYLENOL) 325 MG tablet Take 650 mg by mouth every 6 (six) hours as needed for mild pain or moderate pain.     [provider]  amiodarone (PACERONE) 200 MG tablet TAKE 1 TABLET BY MOUTH EVERY DAY 09/29/19   [name], MD  carvedilol (COREG) 3.125 MG tablet Take 1 tablet (3.125 mg total) by mouth 2 (two) times daily. Hold for heart rate less than 60 08/19/19 08/18/20  [name], MD  cephALEXin (KEFLEX) 500 MG capsule Take 1 capsule (500 mg total) by mouth 2 (two) times daily. 10/01/19   [name], MD  cyanocobalamin (,VITAMIN B-12,) 1000 MCG/ML injection Inject 1,000 mcg into the muscle every 30 (thirty) days.    [provider]  cyanocobalamin ([employer] VITAMIN B12) 1000 MCG tablet Take 1 tablet (1,000 mcg total) by mouth daily. 08/19/19   [name], MD  ferrous sulfate (FEROSUL) 325 (65 FE) MG tablet Take 1 tablet (325 mg total) by mouth daily. 07/14/19    [name], MD  furosemide (LASIX) 40 MG tablet Take 0.5 tablets (20 mg total) by mouth daily. 07/30/19 10/28/19  [name], MD  Insulin Detemir (LEVEMIR FLEXTOUCH) 100 UNIT/ML Pen Inject 6 Units into the skin daily. Can use abdominal and outer thigh subcutaneous tissue. 09/08/19 12/07/19  [name], MD  levothyroxine (SYNTHROID) 150 MCG tablet Take 1 tablet (150 mcg total) by mouth daily before breakfast. 07/30/19 01/26/20  [name], MD  meclizine (ANTIVERT) 25 MG tablet Take 25 mg by mouth daily as needed for dizziness.    [provider]  metFORMIN (GLUCOPHAGE) 1000 MG tablet Take 1 tablet (1,000 mg total) by mouth 2 (two) times daily with a meal. TAKE 1 TABLET TWICE DAILY  WITH  A  MEAL 07/30/19   [name], MD  mirtazapine (REMERON) 7.5 MG tablet Take 1 tablet (7.5 mg total) by mouth at bedtime. 09/30/19   [name], MD  ondansetron (ZOFRAN) 4 MG tablet Take 4 mg by mouth every 8 (eight) hours as needed for nausea. 12/04/18   [provider]  ondansetron (ZOFRAN-ODT) 4 MG disintegrating tablet TAKE 1 TABLET  BY MOUTH EVERY 8 HOURS AS NEEDED FOR NAUSEA OR VOMITTING 07/30/19   [name], MD  pantoprazole (PROTONIX) 40 MG tablet Take 1 tablet (40 mg total) by mouth daily. 07/30/19 01/26/20  [name], MD  polyethylene glycol (MIRALAX / Floria Raveling) packet Take 17 g by mouth daily as needed for moderate constipation.     [provider]  potassium chloride (KLOR-CON) 10 MEQ tablet Take 1 tablet (10 mEq total) by mouth daily. 08/19/19   [name], MD  simvastatin (ZOCOR) 20 MG tablet TAKE 1 TABLET BY MOUTH EACH NIGHT AT BEDTIME FOR CHOLESTEROL 07/30/19   [name], MD  TRUE METRIX BLOOD GLUCOSE TEST test strip CHECK FASTING BLOOD SUGAR EVERY MORNING 08/29/19   [name], MD    Allergies Oxycodone  Family History  Problem Relation Age of Onset  . Heart disease Mother 42  . Heart  attack Mother 73  . Cancer Father        colon  . Diabetes Sister   . Hypertension Sister   . Cancer Brother        kidney  . Diabetes Brother   . Cancer Daughter        ovarian  . Non-Hodgkin's lymphoma Son     Social History Social History   Tobacco Use  . Smoking status: Never Smoker  . Smokeless tobacco: Never Used  Substance Use Topics  . Alcohol use: No  . Drug use: No      Review of Systems Constitutional: No fever/chills Eyes: No visual changes. ENT: No sore throat. Cardiovascular: Denies chest pain. Respiratory: Denies shortness of breath. Gastrointestinal: No abdominal pain.  No nausea, no vomiting.  No diarrhea.  No constipation. Genitourinary: Negative for dysuria. Musculoskeletal: Negative for back pain.  Left wrist pain Skin: Negative for rash. Neurological: Positive headache from head trauma All other ROS negative ____________________________________________   PHYSICAL EXAM:  VITAL SIGNS: ED Triage Vitals  Enc Vitals Group     BP 10/13/19 2113 (!) 144/62     Pulse Rate 10/13/19 2113 68     Resp 10/13/19 2113 20     Temp 10/13/19 2113 98 F (36.7 C)     Temp Source 10/13/19 2113 Oral     SpO2 10/13/19 2113 99 %     Weight 10/13/19 2114 185 lb (83.9 kg)     Height 10/13/19 2114 5\' 1"  (1.549 m)     Head Circumference --      Peak Flow --      Pain Score 10/13/19 2113 2     Pain Loc --      Pain Edu? --      Excl. in [place]? --     Constitutional: Alert and oriented. GCS 15  Eyes: Conjunctivae are normal. EOMI. Head: Hematoma to the left forehead Nose: No congestion/rhinnorhea. Mouth/Throat: Mucous membranes are moist.   Neck: No stridor. Trachea Midline. FROM Cardiovascular: Normal rate, regular rhythm. Grossly normal heart sounds.  Good peripheral circulation. No chest wall tenderness Respiratory: Normal respiratory effort.  No retractions. Lungs CTAB. Gastrointestinal: Soft and nontender. No distention. No abdominal bruits.    Musculoskeletal:   RUE: No point tenderness, deformity or other signs of injury. Radial pulse intact. Neuro intact. Full ROM in joint. LUE: Mild tenderness to the wrist, with a small abrasion.  No snuffbox tenderness radial pulse intact. Neuro intact. Full ROM in joints RLE: No point tenderness, deformity or other signs of injury. DP pulse intact. Neuro  intact. Full ROM in joints. LLE: No point tenderness, deformity or other signs of injury. DP pulse intact. Neuro intact. Full ROM in joints. Neurologic:  Normal speech and language. No gross focal neurologic deficits are appreciated.  Skin:  Skin is warm, dry and intact. No rash noted. Psychiatric: Mood and affect are normal. Speech and behavior are normal. GU: Deferred   ____________________________________________   LABS (all labs ordered are listed, but only abnormal results are displayed)  Labs Reviewed  CBC WITH DIFFERENTIAL/PLATELET  BASIC METABOLIC PANEL   ____________________________________________   ED ECG REPORT I, Vanessa [place], the attending physician, personally viewed and interpreted this ECG.  EKG is normal sinus rate of 68 no ST elevations, no T wave inversions, normal intervals ____________________________________________  RADIOLOGY Robert Bellow, personally viewed and evaluated these images (plain radiographs) as part of my medical decision making, as well as reviewing the written report by the radiologist.  ED MD interpretation: xray neg  Official radiology report(s): DG Wrist Complete Left  Result Date: 10/13/2019 CLINICAL DATA:  Fall EXAM: LEFT WRIST - COMPLETE 3+ VIEW COMPARISON:  None. FINDINGS: No acute displaced fracture or malalignment. Advanced arthritis at the first [facility] joint. Vascular calcifications. Soft tissue swelling IMPRESSION: No acute osseous abnormality Electronically Signed   By: Donavan Foil M.D.   On: 10/13/2019 21:42   CT Head Wo Contrast  Result Date: 10/13/2019 CLINICAL DATA:   Headache.  Fall. EXAM: CT HEAD WITHOUT CONTRAST TECHNIQUE: Contiguous axial images were obtained from the base of the skull through the vertex without intravenous contrast. COMPARISON:  10/06/2019 FINDINGS: Brain: There is atrophy and chronic small vessel disease changes. No acute intracranial abnormality. Specifically, no hemorrhage, hydrocephalus, mass lesion, acute infarction, or significant intracranial injury. Vascular: No hyperdense vessel or unexpected calcification. Skull: No acute calvarial abnormality. Sinuses/Orbits: Visualized paranasal sinuses and mastoids clear. Orbital soft tissues unremarkable. Other: Soft tissue swelling in the left forehead. IMPRESSION: Atrophy, chronic microvascular disease. No acute intracranial abnormality. Electronically Signed   By: Rolm Baptise M.D.   On: 10/13/2019 21:46   CT Cervical Spine Wo Contrast  Result Date: 10/13/2019 CLINICAL DATA:  Fall EXAM: CT CERVICAL SPINE WITHOUT CONTRAST TECHNIQUE: Multidetector CT imaging of the cervical spine was performed without intravenous contrast. Multiplanar CT image reconstructions were also generated. COMPARISON:  None. FINDINGS: Alignment: No subluxation. Skull base and vertebrae: No acute fracture. No primary bone lesion or focal pathologic process. Soft tissues and spinal canal: No prevertebral fluid or swelling. No visible canal hematoma. Disc levels: Advanced diffuse degenerative facet disease bilaterally. Moderate degenerative disc disease most pronounced at C5-6 and C6-7. Upper chest: Negative Other: None IMPRESSION: Degenerative disc and facet disease.  No acute bony abnormality. Electronically Signed   By: Rolm Baptise M.D.   On: 10/13/2019 21:50    ____________________________________________   PROCEDURES  Procedure(s) performed (including Critical Care):  Procedures   ____________________________________________   INITIAL IMPRESSION / ASSESSMENT AND PLAN / ED COURSE    EMALEY APPLIN was evaluated  in Emergency Department on 10/13/2019 for the symptoms described in the history of present illness. She was evaluated in the context of the global COVID-19 pandemic, which necessitated consideration that the patient might be at risk for infection with the SARS-CoV-2 virus that causes COVID-19. Institutional protocols and algorithms that pertain to the evaluation of patients at risk for COVID-19 are in a state of rapid change based on information released by regulatory bodies including the CDC and federal and state organizations. These  policies and algorithms were followed during the patient's care in the ED.    We will get CT head to evaluate for epidural subdural hematoma, CT cervical evaluate for cervical fracture.  This sounds more likely mechanical low suspicion for syncope.  Labs were already drawn to evaluate for electrolyte abnormalities, AKI.  Low suspicion for ACS given no chest pain.  X-ray ordered to evaluate for fracture   Kidney function is at baseline.  X-ray of right hand was negative.  CT head and CT cervical were negative  We will discharge patient back to facility     ____________________________________________   FINAL CLINICAL IMPRESSION(S) / ED DIAGNOSES   Final diagnoses:  Fall, initial encounter      MEDICATIONS GIVEN DURING THIS VISIT:  Medications  Tdap (BOOSTRIX) injection 0.5 mL (0.5 mLs Intramuscular Given 10/13/19 2154)     ED Discharge Orders    None       Note:  This document was prepared using Dragon voice recognition software and may include unintentional dictation errors.   Vanessa [place], MD 10/13/19 2233

## 2019-10-13 NOTE — ED Triage Notes (Signed)
Patient arrived to ED via EMS from [place] complaining of mechanical fall tonight from wheelchair.  Patient has hematoma on left forehead and complains of left hand and wrist pain

## 2019-10-13 NOTE — Discharge Instructions (Addendum)
Your CT scans were negative.  You can take 1 g of Tylenol every 8 hours.  Return to the ER for any other concerns

## 2019-10-14 DIAGNOSIS — Z743 Need for continuous supervision: Secondary | ICD-10-CM | POA: Diagnosis not present

## 2019-10-14 DIAGNOSIS — R279 Unspecified lack of coordination: Secondary | ICD-10-CM | POA: Diagnosis not present

## 2019-10-14 DIAGNOSIS — W19XXXA Unspecified fall, initial encounter: Secondary | ICD-10-CM | POA: Diagnosis not present

## 2019-10-14 DIAGNOSIS — I959 Hypotension, unspecified: Secondary | ICD-10-CM | POA: Diagnosis not present

## 2019-10-16 DIAGNOSIS — Z20828 Contact with and (suspected) exposure to other viral communicable diseases: Secondary | ICD-10-CM | POA: Diagnosis not present

## 2019-10-20 DIAGNOSIS — I5032 Chronic diastolic (congestive) heart failure: Secondary | ICD-10-CM | POA: Diagnosis not present

## 2019-10-20 IMAGING — CT CT HEAD WITHOUT CONTRAST
3 of 4 series · 16 of 47 positions shown, 19 images · non-contrast
Comparison: March 30, 2016

CLINICAL DATA: Slurred speech.  History of stroke.

EXAM:
CT HEAD WITHOUT CONTRAST
TECHNIQUE: Contiguous axial images were obtained from the base of the skull
through the vertex without intravenous contrast.

[Series 3: head wo · axial · 0.42mm/px · z∈[-88,+42]mm · 10 of 32 slices shown, 13 images]
[im 3/32  brain]
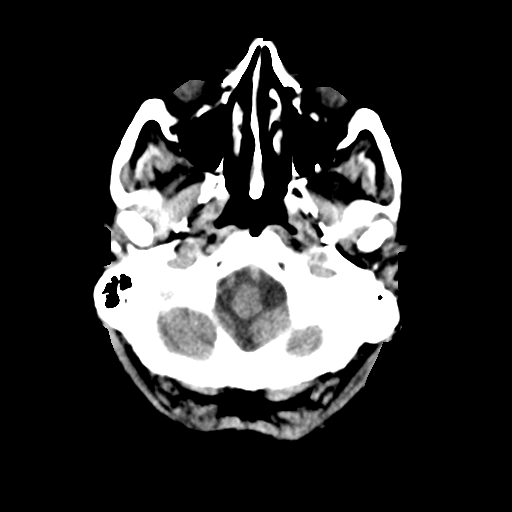
[im 3/32  bone]
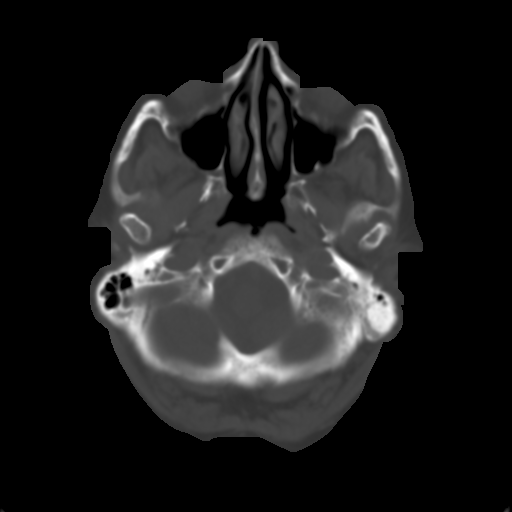
[im 7/32  brain]
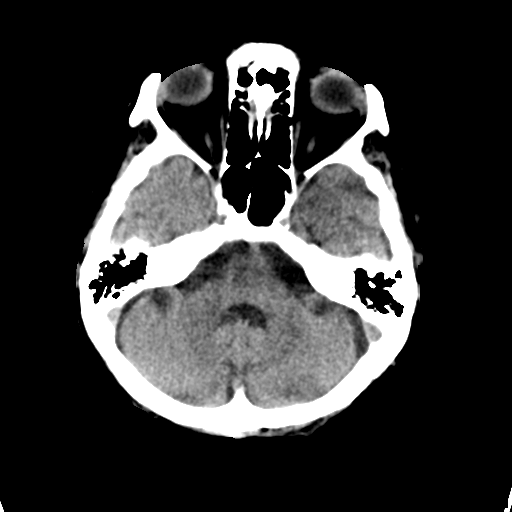
[im 9/32  brain]
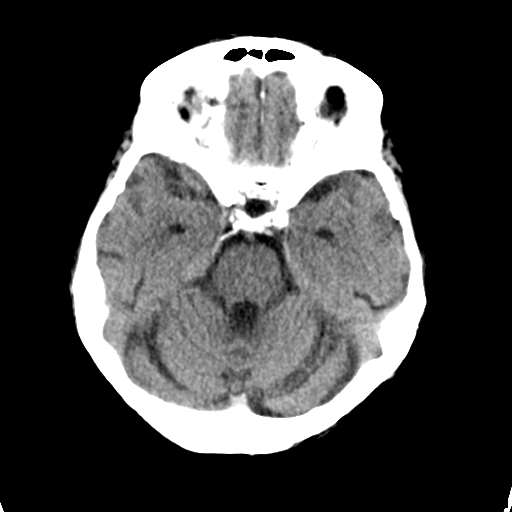
[im 11/32  brain]
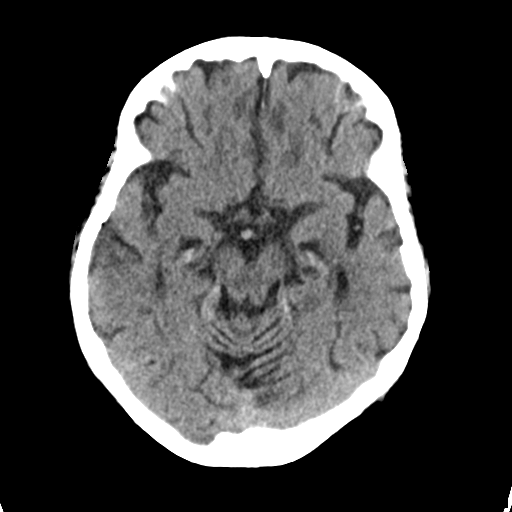
[im 15/32  brain]
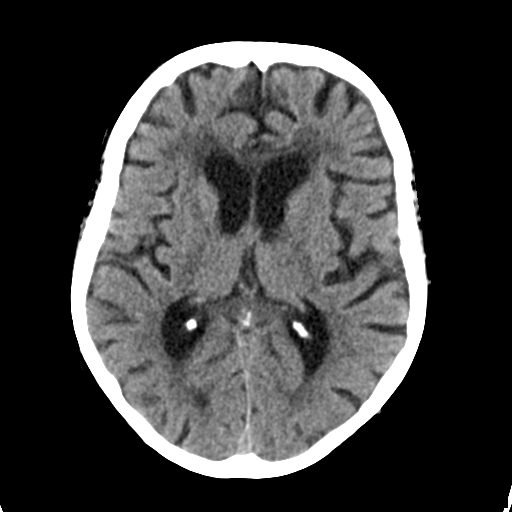
[im 15/32  bone]
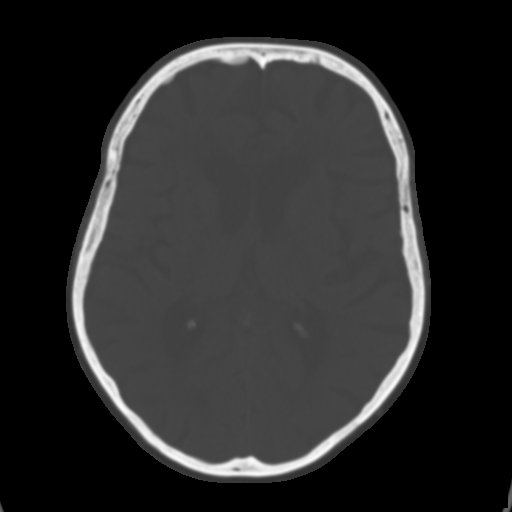
[im 17/32  brain]
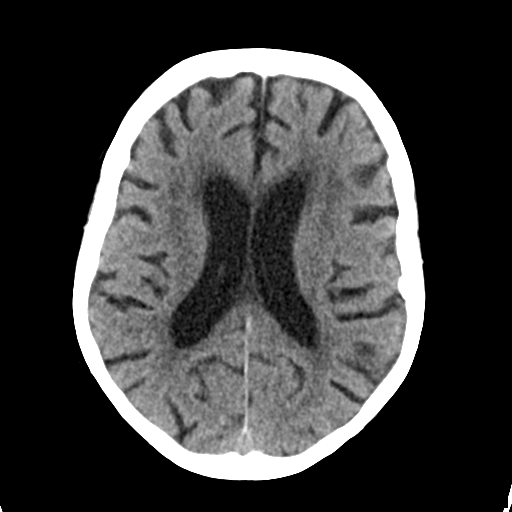
[im 21/32  brain]
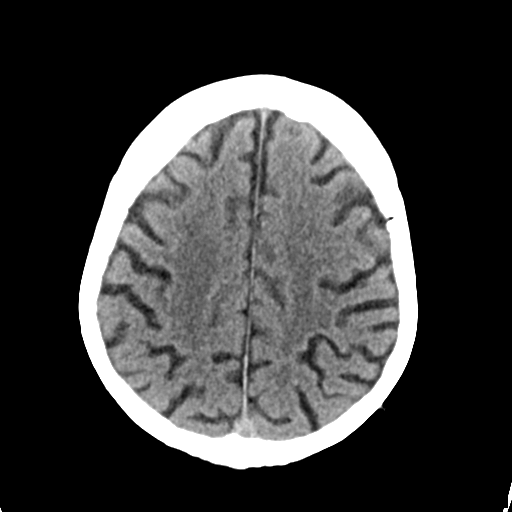
[im 23/32  brain]
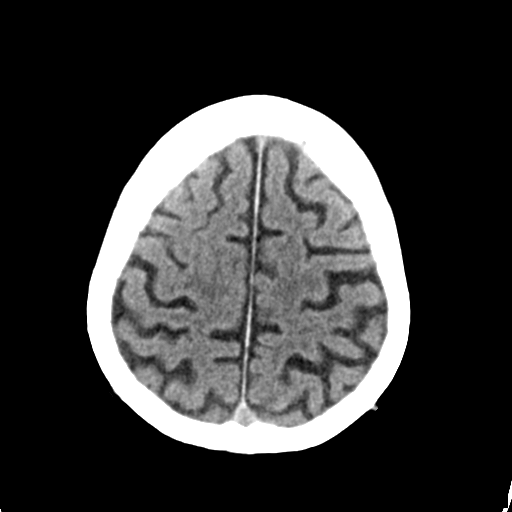
[im 25/32  brain]
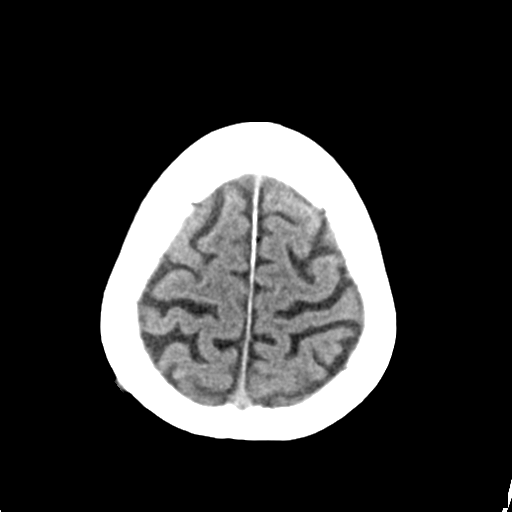
[im 25/32  bone]
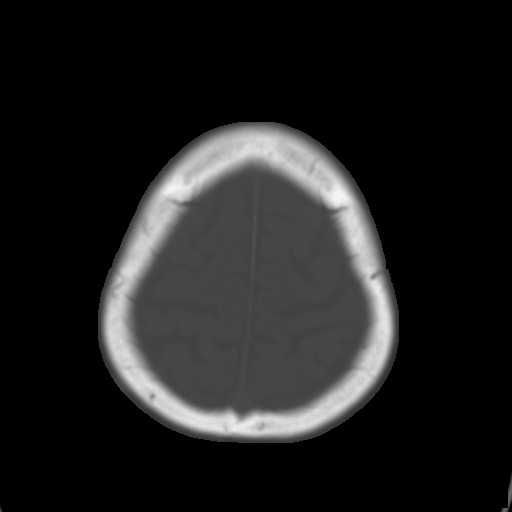
[im 29/32  brain]
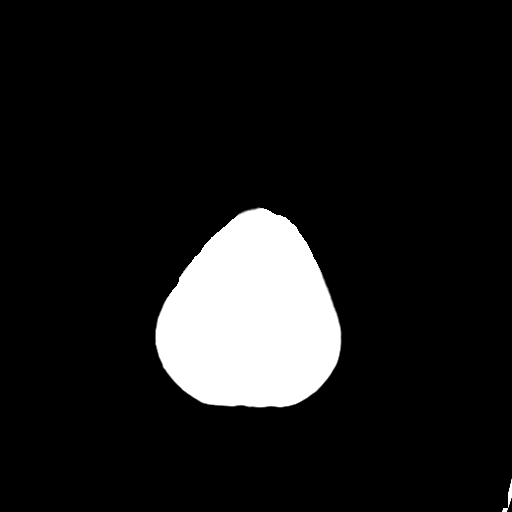

[Series 5: coronal soft tissue · coronal · 0.31mm/px · 3 of 61 slices shown]
[im 21/61  brain]
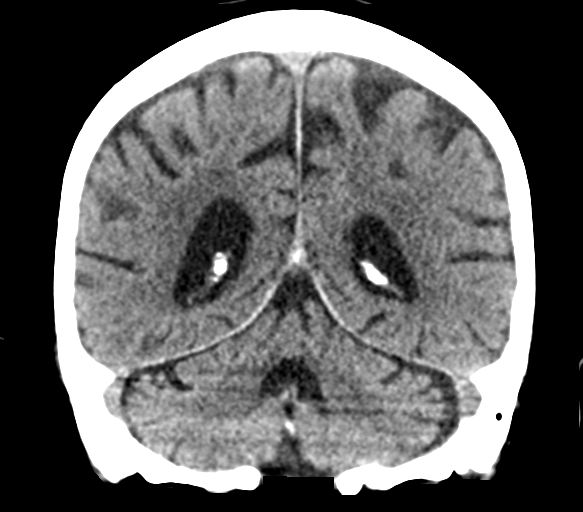
[im 27/61  brain]
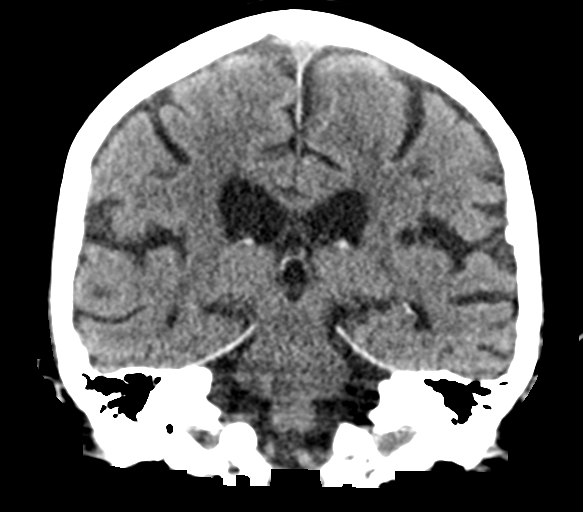
[im 34/61  brain]
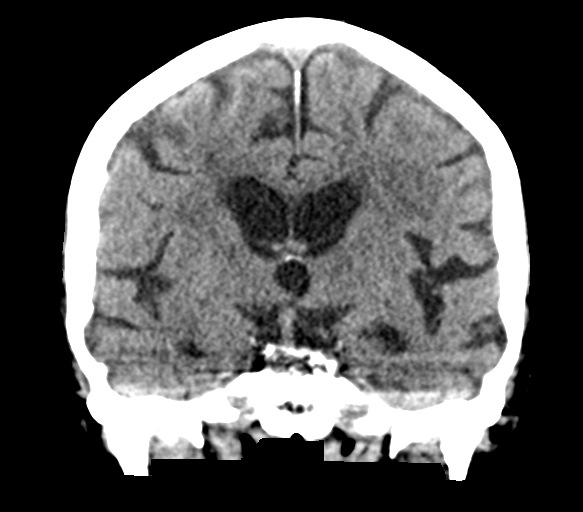

[Series 6: sagittal soft tissue · sagittal · 0.32mm/px · 3 of 53 slices shown]
[im 18/53  brain]
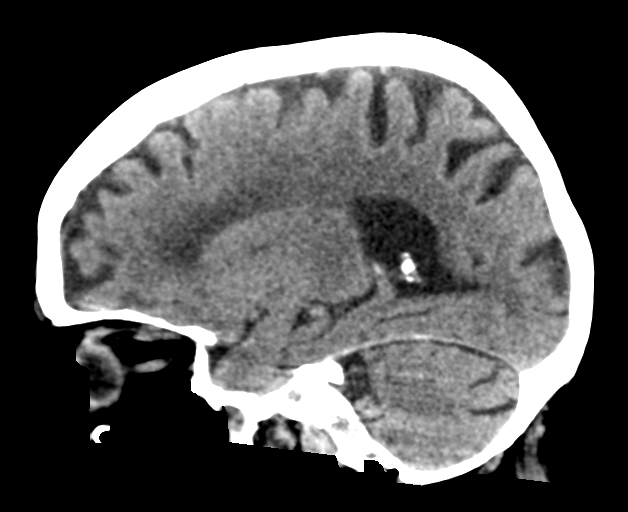
[im 27/53  brain]
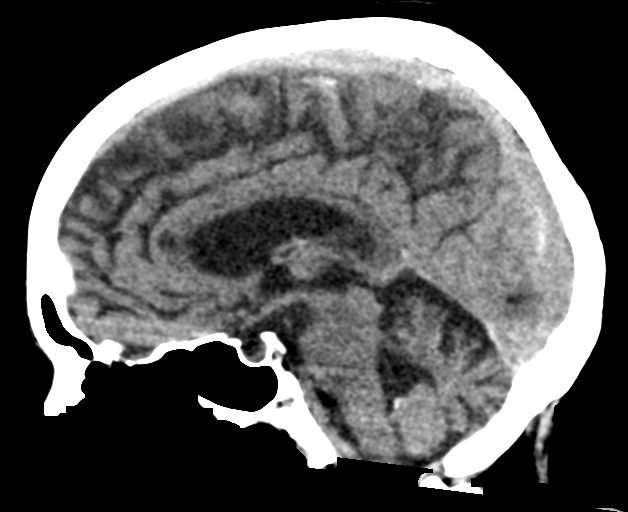
[im 35/53  brain]
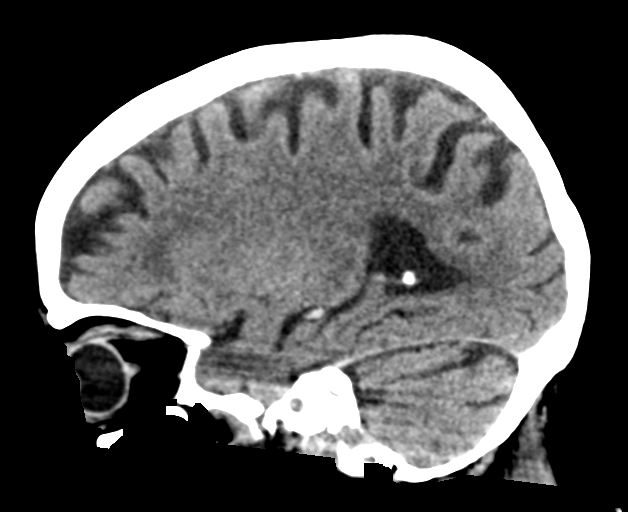

[16 of 47 positions shown; findings below may reference images not displayed]

FINDINGS: Brain: No subdural, epidural, or subarachnoid hemorrhage. Cerebellum
and brainstem are normal. Basal cisterns are patent ventricles and
sulci are prominent. White matter changes are identified. No acute
cortical ischemia or infarct identified. No mass effect or midline
shift.

Vascular: No hyperdense vessel or unexpected calcification.

Skull: Normal. Negative for fracture or focal lesion.

Sinuses/Orbits: No acute finding.

Other: None.
IMPRESSION: 1. No acute intracranial abnormalities.

## 2019-10-20 IMAGING — MR MRI HEAD WITHOUT CONTRAST
10 of 11 series · 44 of 48 positions shown · non-contrast
Comparison: Head CT 12/06/2018 and MRI 10/22/2018

CLINICAL DATA: Dysarthria.  Ataxia.

EXAM:
MRI HEAD WITHOUT CONTRAST
TECHNIQUE: Multiplanar, multiecho pulse sequences of the brain and surrounding
structures were obtained without intravenous contrast.

[Series 2: ax dwi_tracew · axial · 3.0mm · 0.68mm/px · z∈[-93,+69]mm · 8 of 56 slices shown]
[im 1/56]
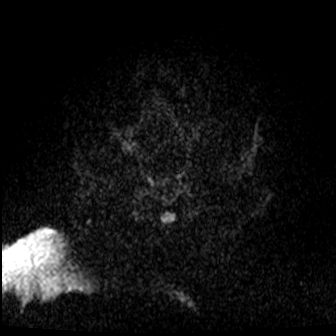
[im 8/56]
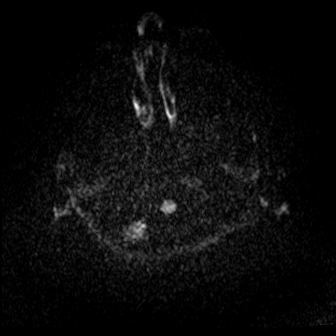
[im 16/56]
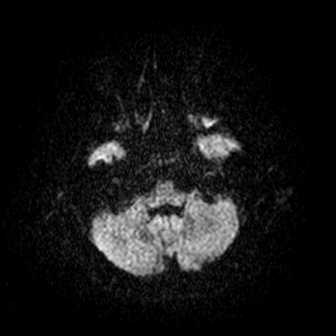
[im 24/56]
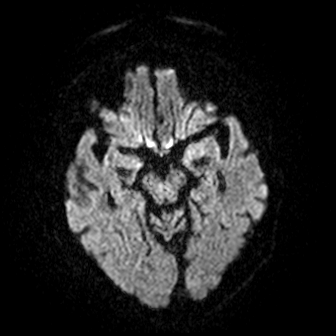
[im 32/56]
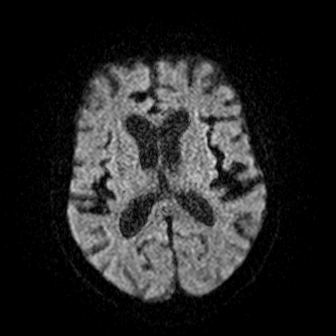
[im 40/56]
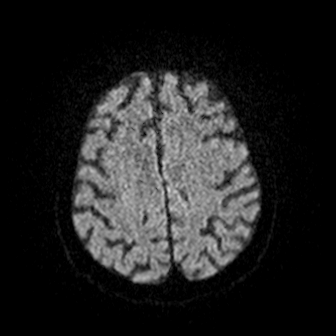
[im 48/56]
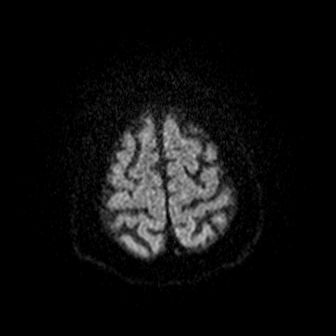
[im 56/56]
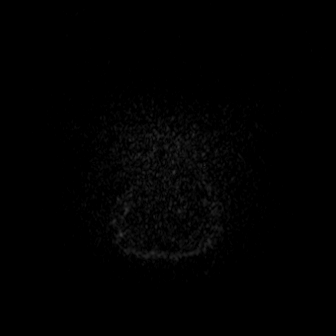

[Series 3: ax dwi_adc · axial · 3.0mm · 0.68mm/px · z∈[-93,+69]mm · 8 of 56 slices shown]
[im 1/56]
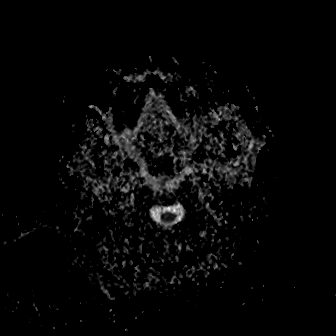
[im 8/56]
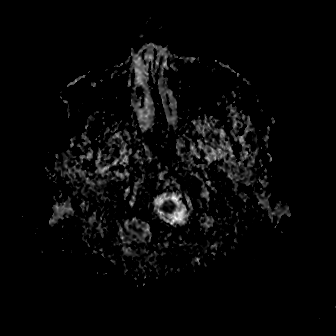
[im 16/56]
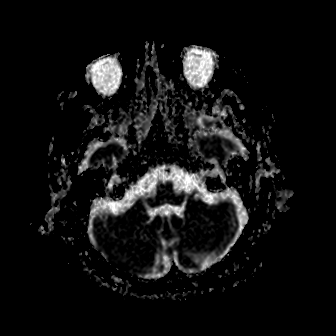
[im 24/56]
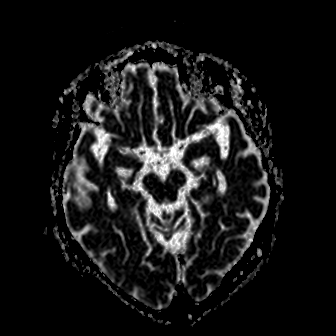
[im 32/56]
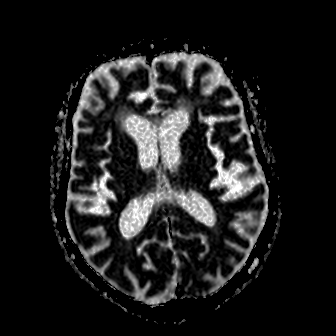
[im 40/56]
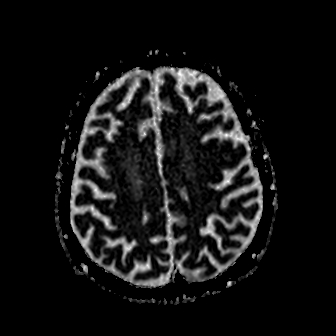
[im 48/56]
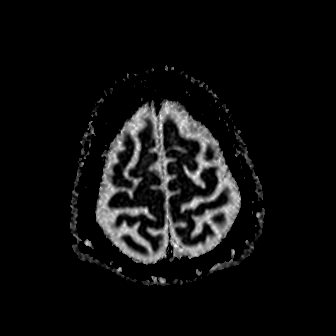
[im 56/56]
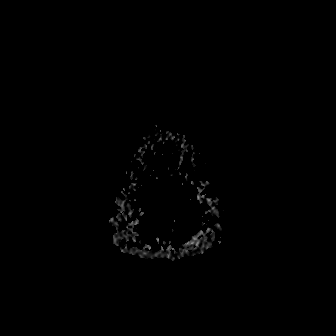

[Series 4: cor dwi_tracew · coronal · 5.0mm · 0.68mm/px · 5 of 38 slices shown]
[im 1/38]
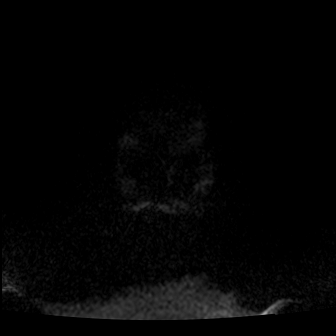
[im 10/38]
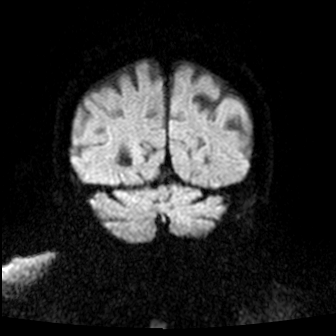
[im 19/38]
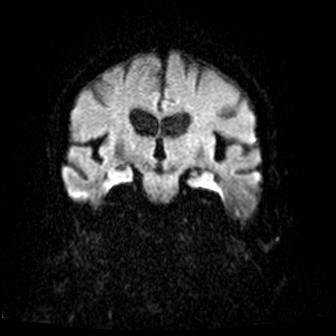
[im 28/38]
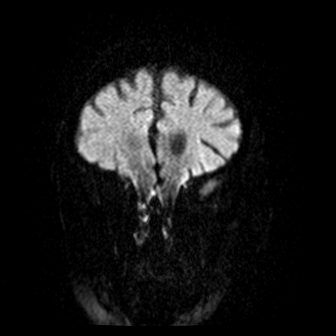
[im 38/38]
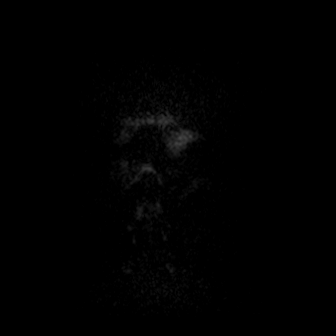

[Series 5: cor dwi_adc · coronal · 5.0mm · 0.68mm/px · 4 of 38 slices shown]
[im 1/38]
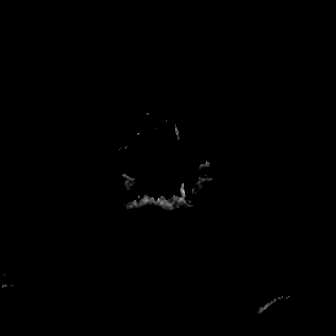
[im 10/38]
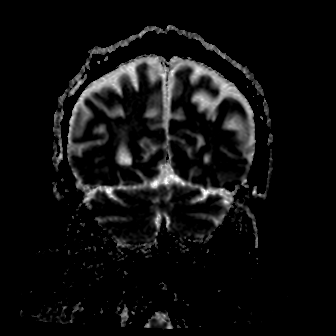
[im 19/38]
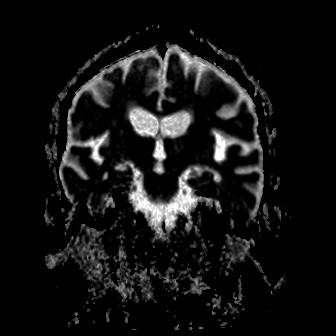
[im 28/38]
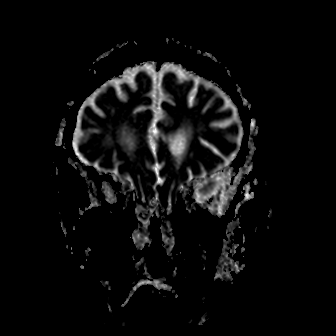

[Series 6: T1 · sagittal · 5.0mm · 0.94mm/px · 3 of 23 slices shown (1 of 2)]
[im 1/23]
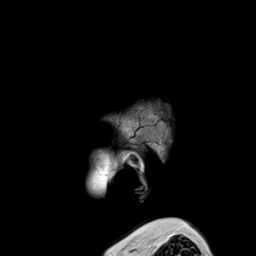
[im 12/23]
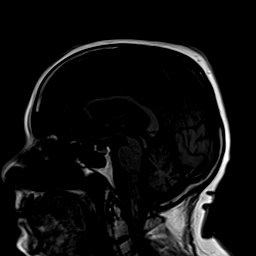
[im 23/23]
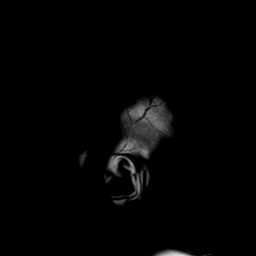

[Series 7: T2 · axial · 5.0mm · 0.45mm/px · z∈[-91,+62]mm · 3 of 27 slices shown (1 of 2)]
[im 1/27]
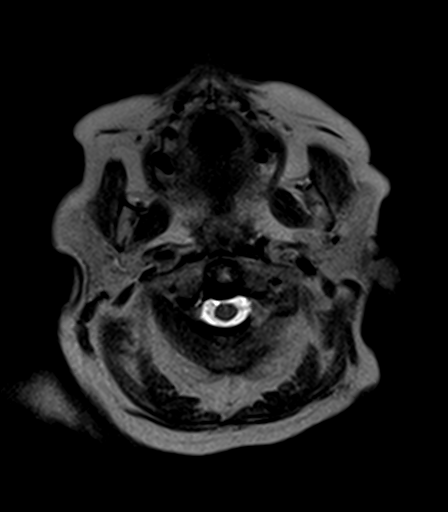
[im 14/27]
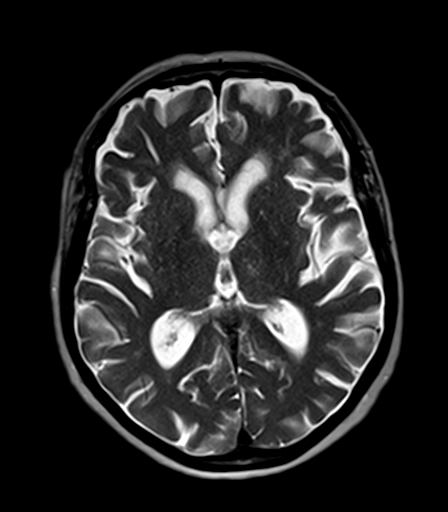
[im 27/27]
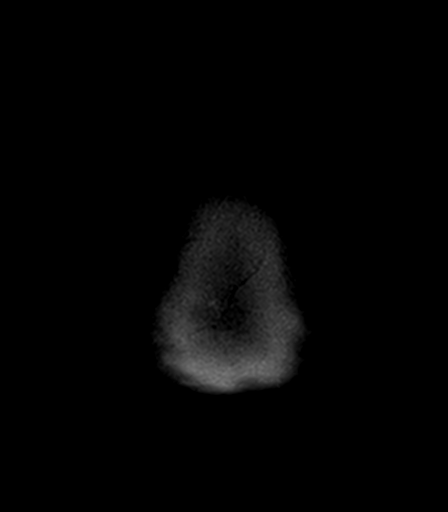

[Series 9: FLAIR · axial · 5.0mm · 1.20mm/px · z∈[-91,+62]mm · 3 of 27 slices shown (1 of 2)]
[im 1/27]
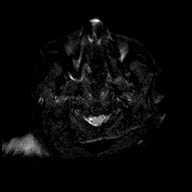
[im 14/27]
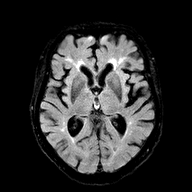
[im 27/27]
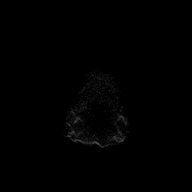

[Series 10: T1 · axial · 5.0mm · 0.90mm/px · z∈[-91,+62]mm · 3 of 27 slices shown (2 of 2)]
[im 1/27]
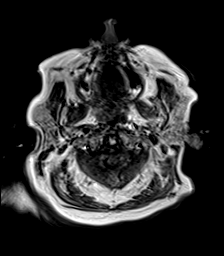
[im 14/27]
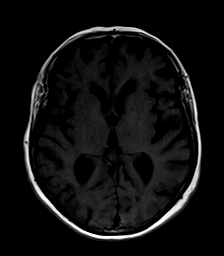
[im 27/27]
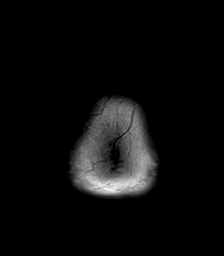

[Series 11: T2 · coronal · 5.0mm · 0.45mm/px · 4 of 31 slices shown (2 of 2)]
[im 1/31]
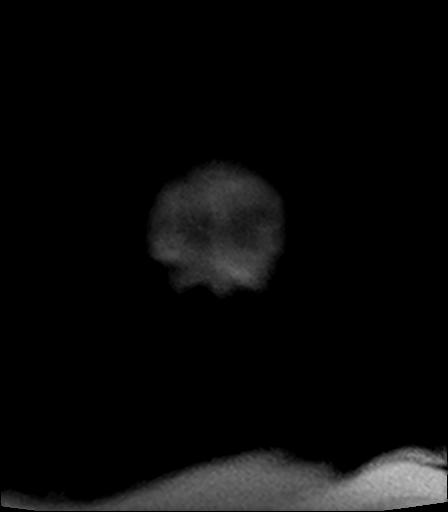
[im 11/31]
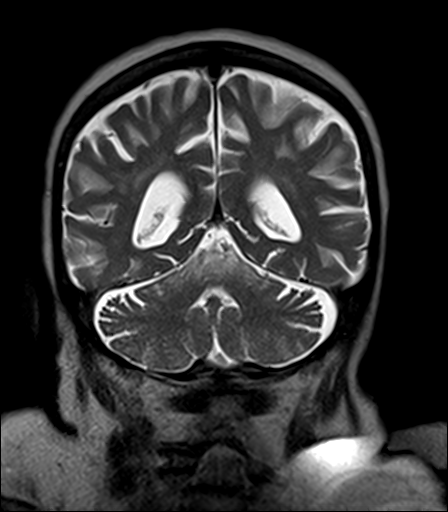
[im 21/31]
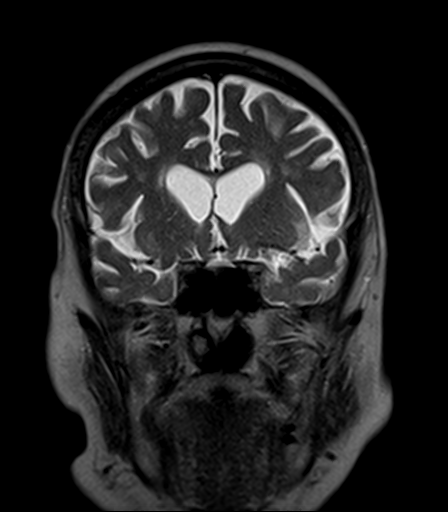
[im 31/31]
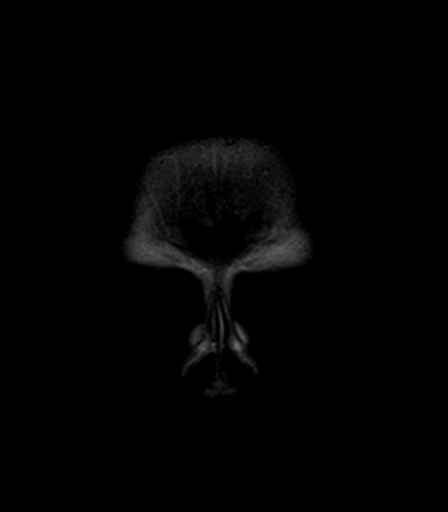

[Series 13: FLAIR · sagittal · 5.0mm · 1.20mm/px · 3 of 26 slices shown (2 of 2)]
[im 1/26]
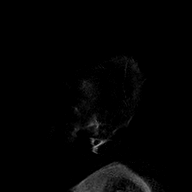
[im 13/26]
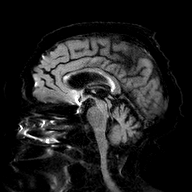
[im 26/26]
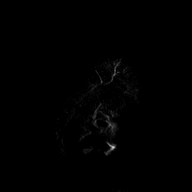

[44 of 48 positions shown; findings below may reference images not displayed]

FINDINGS: The study is mildly motion degraded.

Brain: There is no evidence of acute infarct, intracranial
hemorrhage, mass, midline shift, or extra-axial fluid collection.
There is mild cerebral atrophy. Patchy T2 hyperintensities in the
cerebral white matter are similar to the prior MRI and nonspecific
but likely reflect moderate chronic small vessel ischemia given
patient's vascular risk factors. A tiny chronic left parietal
cortical infarct is unchanged.

Vascular: Major intracranial vascular flow voids are preserved.

Skull and upper cervical spine: Unremarkable bone marrow signal.

Sinuses/Orbits: Unremarkable orbits. Clear paranasal sinuses. Trace
bilateral mastoid fluid.

Other: None.
IMPRESSION: 1. No acute intracranial abnormality.
2. Moderate chronic small vessel ischemic disease.

## 2019-10-22 ENCOUNTER — Emergency Department: Payer: PPO

## 2019-10-22 ENCOUNTER — Encounter: Payer: Self-pay | Admitting: Internal Medicine

## 2019-10-22 ENCOUNTER — Ambulatory Visit (INDEPENDENT_AMBULATORY_CARE_PROVIDER_SITE_OTHER): Payer: PPO | Admitting: Family Medicine

## 2019-10-22 ENCOUNTER — Other Ambulatory Visit: Payer: Self-pay

## 2019-10-22 ENCOUNTER — Encounter: Payer: Self-pay | Admitting: Family Medicine

## 2019-10-22 ENCOUNTER — Inpatient Hospital Stay
Admission: EM | Admit: 2019-10-22 | Discharge: 2019-10-30 | DRG: 682 | Disposition: A | Discharge status: Hospice | Payer: PPO | Attending: Internal Medicine | Admitting: Internal Medicine

## 2019-10-22 DIAGNOSIS — Z7189 Other specified counseling: Secondary | ICD-10-CM | POA: Diagnosis not present

## 2019-10-22 DIAGNOSIS — R05 Cough: Secondary | ICD-10-CM | POA: Insufficient documentation

## 2019-10-22 DIAGNOSIS — E039 Hypothyroidism, unspecified: Secondary | ICD-10-CM | POA: Diagnosis present

## 2019-10-22 DIAGNOSIS — Z66 Do not resuscitate: Secondary | ICD-10-CM | POA: Diagnosis present

## 2019-10-22 DIAGNOSIS — R001 Bradycardia, unspecified: Secondary | ICD-10-CM | POA: Diagnosis not present

## 2019-10-22 DIAGNOSIS — I340 Nonrheumatic mitral (valve) insufficiency: Secondary | ICD-10-CM | POA: Diagnosis not present

## 2019-10-22 DIAGNOSIS — I959 Hypotension, unspecified: Secondary | ICD-10-CM | POA: Diagnosis not present

## 2019-10-22 DIAGNOSIS — I272 Pulmonary hypertension, unspecified: Secondary | ICD-10-CM | POA: Diagnosis not present

## 2019-10-22 DIAGNOSIS — R059 Cough, unspecified: Secondary | ICD-10-CM | POA: Insufficient documentation

## 2019-10-22 DIAGNOSIS — N170 Acute kidney failure with tubular necrosis: Secondary | ICD-10-CM | POA: Diagnosis not present

## 2019-10-22 DIAGNOSIS — Z20822 Contact with and (suspected) exposure to covid-19: Secondary | ICD-10-CM | POA: Diagnosis not present

## 2019-10-22 DIAGNOSIS — I4891 Unspecified atrial fibrillation: Secondary | ICD-10-CM | POA: Diagnosis not present

## 2019-10-22 DIAGNOSIS — N179 Acute kidney failure, unspecified: Secondary | ICD-10-CM

## 2019-10-22 DIAGNOSIS — S299XXA Unspecified injury of thorax, initial encounter: Secondary | ICD-10-CM | POA: Diagnosis not present

## 2019-10-22 DIAGNOSIS — R0602 Shortness of breath: Secondary | ICD-10-CM

## 2019-10-22 DIAGNOSIS — D631 Anemia in chronic kidney disease: Secondary | ICD-10-CM | POA: Diagnosis not present

## 2019-10-22 DIAGNOSIS — N184 Chronic kidney disease, stage 4 (severe): Secondary | ICD-10-CM | POA: Diagnosis not present

## 2019-10-22 DIAGNOSIS — I129 Hypertensive chronic kidney disease with stage 1 through stage 4 chronic kidney disease, or unspecified chronic kidney disease: Secondary | ICD-10-CM | POA: Diagnosis not present

## 2019-10-22 DIAGNOSIS — E1122 Type 2 diabetes mellitus with diabetic chronic kidney disease: Secondary | ICD-10-CM | POA: Diagnosis not present

## 2019-10-22 DIAGNOSIS — Z794 Long term (current) use of insulin: Secondary | ICD-10-CM | POA: Diagnosis not present

## 2019-10-22 DIAGNOSIS — I5033 Acute on chronic diastolic (congestive) heart failure: Secondary | ICD-10-CM | POA: Diagnosis not present

## 2019-10-22 DIAGNOSIS — E785 Hyperlipidemia, unspecified: Secondary | ICD-10-CM | POA: Diagnosis not present

## 2019-10-22 DIAGNOSIS — G9340 Encephalopathy, unspecified: Secondary | ICD-10-CM | POA: Diagnosis not present

## 2019-10-22 DIAGNOSIS — I361 Nonrheumatic tricuspid (valve) insufficiency: Secondary | ICD-10-CM | POA: Diagnosis not present

## 2019-10-22 DIAGNOSIS — H9190 Unspecified hearing loss, unspecified ear: Secondary | ICD-10-CM | POA: Diagnosis present

## 2019-10-22 DIAGNOSIS — Z515 Encounter for palliative care: Secondary | ICD-10-CM | POA: Diagnosis not present

## 2019-10-22 DIAGNOSIS — Z833 Family history of diabetes mellitus: Secondary | ICD-10-CM

## 2019-10-22 DIAGNOSIS — I483 Typical atrial flutter: Secondary | ICD-10-CM | POA: Diagnosis not present

## 2019-10-22 DIAGNOSIS — I5032 Chronic diastolic (congestive) heart failure: Secondary | ICD-10-CM | POA: Diagnosis not present

## 2019-10-22 DIAGNOSIS — Z87440 Personal history of urinary (tract) infections: Secondary | ICD-10-CM

## 2019-10-22 DIAGNOSIS — S0990XA Unspecified injury of head, initial encounter: Secondary | ICD-10-CM

## 2019-10-22 DIAGNOSIS — N1832 Chronic kidney disease, stage 3b: Secondary | ICD-10-CM | POA: Diagnosis not present

## 2019-10-22 DIAGNOSIS — I5030 Unspecified diastolic (congestive) heart failure: Secondary | ICD-10-CM | POA: Diagnosis present

## 2019-10-22 DIAGNOSIS — H8110 Benign paroxysmal vertigo, unspecified ear: Secondary | ICD-10-CM | POA: Diagnosis not present

## 2019-10-22 DIAGNOSIS — I4892 Unspecified atrial flutter: Secondary | ICD-10-CM | POA: Diagnosis not present

## 2019-10-22 DIAGNOSIS — Z8249 Family history of ischemic heart disease and other diseases of the circulatory system: Secondary | ICD-10-CM

## 2019-10-22 DIAGNOSIS — F419 Anxiety disorder, unspecified: Secondary | ICD-10-CM | POA: Diagnosis not present

## 2019-10-22 DIAGNOSIS — I2510 Atherosclerotic heart disease of native coronary artery without angina pectoris: Secondary | ICD-10-CM | POA: Diagnosis present

## 2019-10-22 DIAGNOSIS — I10 Essential (primary) hypertension: Secondary | ICD-10-CM | POA: Diagnosis present

## 2019-10-22 DIAGNOSIS — I130 Hypertensive heart and chronic kidney disease with heart failure and stage 1 through stage 4 chronic kidney disease, or unspecified chronic kidney disease: Secondary | ICD-10-CM | POA: Diagnosis not present

## 2019-10-22 DIAGNOSIS — E1121 Type 2 diabetes mellitus with diabetic nephropathy: Secondary | ICD-10-CM | POA: Diagnosis not present

## 2019-10-22 DIAGNOSIS — I447 Left bundle-branch block, unspecified: Secondary | ICD-10-CM | POA: Diagnosis not present

## 2019-10-22 DIAGNOSIS — E875 Hyperkalemia: Secondary | ICD-10-CM

## 2019-10-22 DIAGNOSIS — W19XXXA Unspecified fall, initial encounter: Secondary | ICD-10-CM | POA: Diagnosis present

## 2019-10-22 DIAGNOSIS — I491 Atrial premature depolarization: Secondary | ICD-10-CM | POA: Diagnosis not present

## 2019-10-22 DIAGNOSIS — N1831 Chronic kidney disease, stage 3a: Secondary | ICD-10-CM | POA: Diagnosis not present

## 2019-10-22 DIAGNOSIS — Z79890 Hormone replacement therapy (postmenopausal): Secondary | ICD-10-CM

## 2019-10-22 DIAGNOSIS — I509 Heart failure, unspecified: Secondary | ICD-10-CM | POA: Diagnosis not present

## 2019-10-22 DIAGNOSIS — E11649 Type 2 diabetes mellitus with hypoglycemia without coma: Secondary | ICD-10-CM | POA: Diagnosis not present

## 2019-10-22 DIAGNOSIS — F329 Major depressive disorder, single episode, unspecified: Secondary | ICD-10-CM | POA: Diagnosis present

## 2019-10-22 DIAGNOSIS — Z885 Allergy status to narcotic agent status: Secondary | ICD-10-CM

## 2019-10-22 DIAGNOSIS — R404 Transient alteration of awareness: Secondary | ICD-10-CM | POA: Diagnosis not present

## 2019-10-22 DIAGNOSIS — I480 Paroxysmal atrial fibrillation: Secondary | ICD-10-CM | POA: Diagnosis not present

## 2019-10-22 DIAGNOSIS — M2550 Pain in unspecified joint: Secondary | ICD-10-CM | POA: Diagnosis not present

## 2019-10-22 DIAGNOSIS — Z7401 Bed confinement status: Secondary | ICD-10-CM | POA: Diagnosis not present

## 2019-10-22 DIAGNOSIS — N189 Chronic kidney disease, unspecified: Secondary | ICD-10-CM | POA: Diagnosis not present

## 2019-10-22 DIAGNOSIS — Z9181 History of falling: Secondary | ICD-10-CM

## 2019-10-22 DIAGNOSIS — I440 Atrioventricular block, first degree: Secondary | ICD-10-CM | POA: Diagnosis not present

## 2019-10-22 DIAGNOSIS — R531 Weakness: Secondary | ICD-10-CM | POA: Diagnosis not present

## 2019-10-22 DIAGNOSIS — Z8673 Personal history of transient ischemic attack (TIA), and cerebral infarction without residual deficits: Secondary | ICD-10-CM

## 2019-10-22 DIAGNOSIS — J9601 Acute respiratory failure with hypoxia: Secondary | ICD-10-CM | POA: Diagnosis not present

## 2019-10-22 DIAGNOSIS — Z993 Dependence on wheelchair: Secondary | ICD-10-CM

## 2019-10-22 DIAGNOSIS — Z79899 Other long term (current) drug therapy: Secondary | ICD-10-CM

## 2019-10-22 DIAGNOSIS — E119 Type 2 diabetes mellitus without complications: Secondary | ICD-10-CM

## 2019-10-22 LAB — BASIC METABOLIC PANEL
Anion gap: 13 (ref 5–15)
Anion gap: 18 — ABNORMAL HIGH (ref 5–15)
BUN: 26 mg/dL — ABNORMAL HIGH (ref 8–23)
BUN: 29 mg/dL — ABNORMAL HIGH (ref 8–23)
CO2: 25 mmol/L (ref 22–32)
CO2: 21 mmol/L — ABNORMAL LOW (ref 22–32)
Calcium: 8.2 mg/dL — ABNORMAL LOW (ref 8.9–10.3)
Calcium: 8 mg/dL — ABNORMAL LOW (ref 8.9–10.3)
Chloride: 100 mmol/L (ref 98–111)
Chloride: 100 mmol/L (ref 98–111)
Creatinine, Ser: 3.06 mg/dL — ABNORMAL HIGH (ref 0.44–1.00)
Creatinine, Ser: 3.86 mg/dL — ABNORMAL HIGH (ref 0.44–1.00)
GFR calc Af Amer: 15 mL/min — ABNORMAL LOW (ref >60)
GFR calc Af Amer: 12 mL/min — ABNORMAL LOW (ref >60)
GFR calc non Af Amer: 13 mL/min — ABNORMAL LOW (ref >60)
GFR calc non Af Amer: 10 mL/min — ABNORMAL LOW (ref >60)
Glucose, Bld: 116 mg/dL — ABNORMAL HIGH (ref 70–99)
Glucose, Bld: 111 mg/dL — ABNORMAL HIGH (ref 70–99)
Potassium: 6.6 mmol/L (ref 3.5–5.1)
Potassium: 6.4 mmol/L (ref 3.5–5.1)
Sodium: 138 mmol/L (ref 135–145)
Sodium: 139 mmol/L (ref 135–145)

## 2019-10-22 LAB — CBC WITH DIFFERENTIAL/PLATELET
Abs Immature Granulocytes: 0.02 10*3/uL (ref 0.00–0.07)
Basophils Absolute: 0.1 10*3/uL (ref 0.0–0.1)
Basophils Relative: 1 %
Eosinophils Absolute: 0 10*3/uL (ref 0.0–0.5)
Eosinophils Relative: 0 %
HCT: 36.8 % (ref 36.0–46.0)
Hemoglobin: 11.5 g/dL — ABNORMAL LOW (ref 12.0–15.0)
Immature Granulocytes: 0 %
Lymphocytes Relative: 21 %
Lymphs Abs: 2.3 10*3/uL (ref 0.7–4.0)
MCH: 29 pg (ref 26.0–34.0)
MCHC: 31.3 g/dL (ref 30.0–36.0)
MCV: 92.9 fL (ref 80.0–100.0)
Monocytes Absolute: 1.2 10*3/uL — ABNORMAL HIGH (ref 0.1–1.0)
Monocytes Relative: 11 %
Neutro Abs: 7.6 10*3/uL (ref 1.7–7.7)
Neutrophils Relative %: 67 %
Platelets: 244 10*3/uL (ref 150–400)
RBC: 3.96 MIL/uL (ref 3.87–5.11)
RDW: 15.4 % (ref 11.5–15.5)
WBC: 11.2 10*3/uL — ABNORMAL HIGH (ref 4.0–10.5)
nRBC: 0 % (ref 0.0–0.2)

## 2019-10-22 LAB — TSH: TSH: 2.881 u[IU]/mL (ref 0.350–4.500)

## 2019-10-22 LAB — POTASSIUM: Potassium: 6 mmol/L — ABNORMAL HIGH (ref 3.5–5.1)

## 2019-10-22 LAB — MAGNESIUM
Magnesium: 1.2 mg/dL — ABNORMAL LOW (ref 1.7–2.4)
Magnesium: 1.6 mg/dL — ABNORMAL LOW (ref 1.7–2.4)

## 2019-10-22 LAB — GLUCOSE, CAPILLARY
Glucose-Capillary: 67 mg/dL — ABNORMAL LOW (ref 70–99)
Glucose-Capillary: 67 mg/dL — ABNORMAL LOW (ref 70–99)
Glucose-Capillary: 94 mg/dL (ref 70–99)

## 2019-10-22 LAB — TROPONIN I (HIGH SENSITIVITY)
Troponin I (High Sensitivity): 64 ng/L — ABNORMAL HIGH (ref <18)
Troponin I (High Sensitivity): 58 ng/L — ABNORMAL HIGH (ref <18)

## 2019-10-22 MED ORDER — DEXTROSE 50 % IV SOLN
1.0000 | Freq: Once | INTRAVENOUS | Status: AC
  Administered 2019-10-22: 50 mL via INTRAVENOUS
  Filled 2019-10-22: qty 50

## 2019-10-22 MED ORDER — LEVOTHYROXINE SODIUM 50 MCG PO TABS
150.0000 ug | ORAL_TABLET | Freq: Every day | ORAL | Status: DC
  Administered 2019-10-23 – 2019-10-26 (×3): 150 ug via ORAL
  Filled 2019-10-22 (×4): qty 3

## 2019-10-22 MED ORDER — MAGNESIUM SULFATE 2 GM/50ML IV SOLN
2.0000 g | Freq: Once | INTRAVENOUS | Status: AC
  Administered 2019-10-23: 2 g via INTRAVENOUS
  Filled 2019-10-22: qty 50

## 2019-10-22 MED ORDER — MAGNESIUM SULFATE 2 GM/50ML IV SOLN
2.0000 g | Freq: Once | INTRAVENOUS | Status: DC
  Filled 2019-10-22: qty 50

## 2019-10-22 MED ORDER — SIMVASTATIN 10 MG PO TABS
20.0000 mg | ORAL_TABLET | Freq: Every day | ORAL | Status: DC
  Administered 2019-10-22 – 2019-10-25 (×3): 20 mg via ORAL
  Filled 2019-10-22 (×5): qty 2

## 2019-10-22 MED ORDER — POLYETHYLENE GLYCOL 3350 17 G PO PACK: 17.0000 g | PACK | Freq: Every day | ORAL | Status: DC | PRN

## 2019-10-22 MED ORDER — DEXTROSE 50 % IV SOLN
1.0000 | Freq: Once | INTRAVENOUS | Status: AC
  Administered 2019-10-23: 50 mL via INTRAVENOUS
  Filled 2019-10-22: qty 50

## 2019-10-22 MED ORDER — INSULIN ASPART 100 UNIT/ML IV SOLN
10.0000 [IU] | Freq: Once | INTRAVENOUS | Status: AC
  Administered 2019-10-22: 12:00:00 10 [IU] via INTRAVENOUS
  Filled 2019-10-22: qty 0.1

## 2019-10-22 MED ORDER — SODIUM ZIRCONIUM CYCLOSILICATE 10 G PO PACK
10.0000 g | PACK | Freq: Once | ORAL | Status: AC
  Administered 2019-10-22: 14:00:00 10 g via ORAL
  Filled 2019-10-22: qty 1

## 2019-10-22 MED ORDER — HEPARIN SODIUM (PORCINE) 5000 UNIT/ML IJ SOLN
5000.0000 [IU] | Freq: Three times a day (TID) | INTRAMUSCULAR | Status: DC
  Administered 2019-10-22 – 2019-10-29 (×19): 5000 [IU] via SUBCUTANEOUS
  Filled 2019-10-22 (×20): qty 1

## 2019-10-22 MED ORDER — MIRTAZAPINE 15 MG PO TABS
7.5000 mg | ORAL_TABLET | Freq: Every day | ORAL | Status: DC
  Administered 2019-10-22 – 2019-10-25 (×4): 7.5 mg via ORAL
  Filled 2019-10-22 (×4): qty 1

## 2019-10-22 MED ORDER — PANTOPRAZOLE SODIUM 40 MG PO TBEC
40.0000 mg | DELAYED_RELEASE_TABLET | Freq: Every day | ORAL | Status: DC
  Administered 2019-10-23 – 2019-10-26 (×4): 40 mg via ORAL
  Filled 2019-10-22 (×4): qty 1

## 2019-10-22 MED ORDER — ACETAMINOPHEN 325 MG PO TABS: 650.0000 mg | ORAL_TABLET | Freq: Four times a day (QID) | ORAL | Status: DC | PRN

## 2019-10-22 MED ORDER — SODIUM BICARBONATE 8.4 % IV SOLN
50.0000 meq | Freq: Once | INTRAVENOUS | Status: AC
  Administered 2019-10-22: 50 meq via INTRAVENOUS
  Filled 2019-10-22: qty 50

## 2019-10-22 MED ORDER — INSULIN ASPART 100 UNIT/ML IV SOLN
10.0000 [IU] | Freq: Once | INTRAVENOUS | Status: AC
  Administered 2019-10-23: 10 [IU] via INTRAVENOUS
  Filled 2019-10-22: qty 0.1

## 2019-10-22 MED ORDER — CALCIUM GLUCONATE 10 % IV SOLN
1.0000 g | Freq: Once | INTRAVENOUS | Status: AC
  Administered 2019-10-22: 1 g via INTRAVENOUS
  Filled 2019-10-22: qty 10

## 2019-10-22 MED ORDER — INSULIN DETEMIR 100 UNIT/ML ~~LOC~~ SOLN
10.0000 [IU] | Freq: Every day | SUBCUTANEOUS | Status: DC
  Filled 2019-10-22 (×2): qty 0.1

## 2019-10-22 MED ORDER — LACTATED RINGERS IV BOLUS
1000.0000 mL | Freq: Once | INTRAVENOUS | Status: AC
  Administered 2019-10-22: 12:00:00 1000 mL via INTRAVENOUS

## 2019-10-22 MED ORDER — INSULIN ASPART 100 UNIT/ML ~~LOC~~ SOLN: 0.0000 [IU] | Freq: Three times a day (TID) | SUBCUTANEOUS | Status: DC

## 2019-10-22 MED ORDER — DEXTROSE 50 % IV SOLN
12.5000 g | INTRAVENOUS | Status: AC
  Administered 2019-10-23: 12.5 g via INTRAVENOUS
  Filled 2019-10-22: qty 50

## 2019-10-22 MED ORDER — SODIUM BICARBONATE 8.4 % IV SOLN
50.0000 meq | Freq: Once | INTRAVENOUS | Status: AC
  Administered 2019-10-23: 50 meq via INTRAVENOUS
  Filled 2019-10-22: qty 50

## 2019-10-22 MED ORDER — MAGNESIUM SULFATE 2 GM/50ML IV SOLN
2.0000 g | Freq: Once | INTRAVENOUS | Status: AC
  Administered 2019-10-22: 2 g via INTRAVENOUS
  Filled 2019-10-22: qty 50

## 2019-10-22 MED ORDER — SODIUM ZIRCONIUM CYCLOSILICATE 10 G PO PACK
10.0000 g | PACK | Freq: Once | ORAL | Status: AC
  Administered 2019-10-23: 10 g via ORAL
  Filled 2019-10-22: qty 1

## 2019-10-22 MED ORDER — ALBUTEROL SULFATE (2.5 MG/3ML) 0.083% IN NEBU
10.0000 mg | INHALATION_SOLUTION | Freq: Once | RESPIRATORY_TRACT | Status: AC
  Administered 2019-10-22: 12:00:00 10 mg via RESPIRATORY_TRACT
  Filled 2019-10-22: qty 12

## 2019-10-22 NOTE — ED Provider Notes (Signed)
[facility] Emergency Department Provider Note   ____________________________________________   First MD Initiated Contact with Patient 10/22/19 1117     (approximate)  I have reviewed the triage vital signs and the nursing notes.   HISTORY  Chief Complaint Bradycardia    HPI Diane Mullins is a 86 y.o. female with possible history of atrial fibrillation, CAD, CHF, hypertension, and hyperlipidemia who presents to the ED for bradycardia.  Patient had a fall about 1 week ago and states she has been feeling weak since then.  She went to her PCPs office for regular follow-up today, where they found her to be bradycardic and much weaker than usual.  EMS was called to bring patient to the ED.  She currently denies any chest pain, shortness of breath, palpitations, lightheadedness, syncope or near syncope.  She does state that she has been feeling weaker than usual but denies any fevers, chills, cough, dysuria, or hematuria.        Past Medical History:  Diagnosis Date  . Atrial fibrillation ([place])   . Benign paroxysmal positional vertigo 07/17/2014  . Chronic diastolic CHF (congestive heart failure) ([place])    a. echo 10/19: EF of 60-65%, no RWMA, Gr2DD, calcified mitral annulus with mild MR, mildly to moderately dilated left atrium, moderate TR, PASP 52 mmHg  . Diabetes mellitus   . H/O: rheumatic fever   . HOH (hard of hearing)   . Hyperlipidemia   . Hypertension   . Pulmonary hypertension ([place])   . Stroke [facility])    TIA Jan. 1st  . Teratoma of left ovary   . Thyroid disease     Patient Active Problem List   Diagnosis Date Noted  . Fall 10/22/2019  . Cough 10/22/2019  . Bradycardia 10/22/2019  . Decreased appetite 09/10/2019  . Depression, major, single episode, moderate ([place]) 09/10/2019  . UTI (urinary tract infection) 07/09/2019  . Generalized weakness 06/23/2019  . GI bleed 06/16/2019  . B12 deficiency 04/06/2019  . Balance problem 04/06/2019   . Gastrointestinal hemorrhage with melena   . Symptomatic anemia   . Melena 12/11/2018  . Constipation 12/10/2018  . Atrial flutter ([place]) 10/17/2018  . 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 AKI (acute kidney injury) ([place])   . Acute on chronic diastolic heart failure ([place])   . Respiratory failure ([place]) 07/05/2018  . Coronary artery calcification seen on CAT scan 11/16/2017  . Morbid obesity ([place]) 10/30/2017  . (HFpEF) heart failure with preserved ejection fraction ([place]) 09/28/2017  . Family history of colon cancer 04/17/2017  . Closed right ankle fracture 10/28/2015  . Fatigue 10/18/2015  . TIA (transient ischemic attack) 09/18/2015  . Orthostatic hypotension 08/02/2015  . Adjustment disorder with anxious mood 03/30/2015  . Postmenopausal estrogen deficiency 06/03/2014  . DNR (do not resuscitate) discussion 03/04/2014  . Gait disturbance 05/07/2013  . Diabetes ([place]) 04/21/2013  . Shortness of breath 12/09/2012  . Hearing loss 12/09/2012  . Anxiety 01/31/2012  . Hypertension 12/22/2011  . Hyperlipidemia 12/22/2011  . Hypothyroidism 12/22/2011    Past Surgical History:  Procedure Laterality Date  . ABDOMINAL HYSTERECTOMY  1973   menorrhagia  . CARDIOVERSION N/A 10/22/2018   Procedure: CARDIOVERSION;  Surgeon: Nelva Bush, MD;  Location: [facility] ORS;  Service: Cardiovascular;  Laterality: N/A;  . CARDIOVERSION N/A 11/26/2018   Procedure: CARDIOVERSION (CATH LAB);  Surgeon: Minna Merritts, MD;  Location: [facility] ORS;  Service: Cardiovascular;  Laterality: N/A;  . COLONOSCOPY WITH PROPOFOL N/A 12/16/2018  Procedure: COLONOSCOPY WITH PROPOFOL;  Surgeon: Lin Landsman, MD;  Location: [facility] ENDOSCOPY;  Service: Gastroenterology;  Laterality: N/A;  . ENTEROSCOPY N/A 12/16/2018   Procedure: Push ENTEROSCOPY;  Surgeon: Lin Landsman, MD;  Location: [facility] ENDOSCOPY;  Service: Gastroenterology;  Laterality: N/A;  . ESOPHAGOGASTRODUODENOSCOPY N/A 06/18/2019   Procedure:  ESOPHAGOGASTRODUODENOSCOPY (EGD);  Surgeon: Lin Landsman, MD;  Location: [facility] ENDOSCOPY;  Service: Gastroenterology;  Laterality: N/A;  . ESOPHAGOGASTRODUODENOSCOPY (EGD) WITH PROPOFOL N/A 12/14/2018   Procedure: ESOPHAGOGASTRODUODENOSCOPY (EGD) WITH PROPOFOL;  Surgeon: Lucilla Lame, MD;  Location: [facility] ENDOSCOPY;  Service: Endoscopy;  Laterality: N/A;  . GIVENS CAPSULE STUDY N/A 06/18/2019   Procedure: GIVENS CAPSULE STUDY;  Surgeon: Lin Landsman, MD;  Location: [facility] ENDOSCOPY;  Service: Gastroenterology;  Laterality: N/A;  . HERNIA REPAIR  1994  . ORIF ANKLE FRACTURE Right 10/29/2015   Procedure: OPEN REDUCTION INTERNAL FIXATION (ORIF) ANKLE FRACTURE;  Surgeon: Thornton Park, MD;  Location: [facility] ORS;  Service: Orthopedics;  Laterality: Right;  . TEE WITHOUT CARDIOVERSION N/A 10/22/2018   Procedure: TRANSESOPHAGEAL ECHOCARDIOGRAM (TEE);  Surgeon: Nelva Bush, MD;  Location: [facility] ORS;  Service: Cardiovascular;  Laterality: N/A;  . VAGINAL DELIVERY     x4    Prior to Admission medications   Medication Sig Start Date End Date Taking? Authorizing Provider  amiodarone (PACERONE) 200 MG tablet TAKE 1 TABLET BY MOUTH EVERY DAY Patient taking differently: Take 200 mg by mouth daily.  09/29/19  Yes [name], MD  carvedilol (COREG) 3.125 MG tablet Take 1 tablet (3.125 mg total) by mouth 2 (two) times daily. Hold for heart rate less than 60 08/19/19 08/18/20 Yes Sonnenberg, Angela Adam, MD  cyanocobalamin (,VITAMIN B-12,) 1000 MCG/ML injection Inject 1,000 mcg into the muscle every 30 (thirty) days.   Yes [provider]  cyanocobalamin ([employer] VITAMIN B12) 1000 MCG tablet Take 1 tablet (1,000 mcg total) by mouth daily. 08/19/19  Yes [name], MD  ferrous sulfate (FEROSUL) 325 (65 FE) MG tablet Take 1 tablet (325 mg total) by mouth daily. 07/14/19  Yes [name], MD  furosemide (LASIX) 40 MG tablet Take 0.5 tablets (20 mg total) by mouth daily. 07/30/19 10/28/19  Yes [name], MD  Insulin Detemir (LEVEMIR FLEXTOUCH) 100 UNIT/ML Pen Inject 6 Units into the skin daily. Can use abdominal and outer thigh subcutaneous tissue. Patient taking differently: Inject 10 Units into the skin daily. Can use abdominal and outer thigh subcutaneous tissue. 09/08/19 12/07/19 Yes [name], MD  levothyroxine (SYNTHROID) 150 MCG tablet Take 1 tablet (150 mcg total) by mouth daily before breakfast. 07/30/19 01/26/20 Yes [name], MD  metFORMIN (GLUCOPHAGE) 1000 MG tablet Take 1 tablet (1,000 mg total) by mouth 2 (two) times daily with a meal. TAKE 1 TABLET TWICE DAILY  WITH  A  MEAL 07/30/19  Yes [name], MD  mirtazapine (REMERON) 7.5 MG tablet Take 1 tablet (7.5 mg total) by mouth at bedtime. 09/30/19  Yes [name], MD  ondansetron (ZOFRAN-ODT) 4 MG disintegrating tablet TAKE 1 TABLET BY MOUTH EVERY 8 HOURS AS NEEDED FOR NAUSEA OR VOMITTING Patient taking differently: Take 4 mg by mouth every 8 (eight) hours as needed for nausea or vomiting.  07/30/19  Yes [name], MD  pantoprazole (PROTONIX) 40 MG tablet Take 1 tablet (40 mg total) by mouth daily. 07/30/19 01/26/20 Yes [name], MD  potassium chloride (KLOR-CON) 10 MEQ tablet Take 1 tablet (10 mEq total) by mouth  daily. 08/19/19  Yes [name], MD  simvastatin (ZOCOR) 20 MG tablet TAKE 1 TABLET BY MOUTH EACH NIGHT AT BEDTIME FOR CHOLESTEROL Patient taking differently: Take 20 mg by mouth at bedtime. TAKE 1 TABLET BY MOUTH EACH NIGHT AT BEDTIME FOR CHOLESTEROL 07/30/19  Yes [name], MD  acetaminophen (TYLENOL) 325 MG tablet Take 650 mg by mouth every 6 (six) hours as needed for mild pain or moderate pain.     [provider]  cephALEXin (KEFLEX) 500 MG capsule Take 1 capsule (500 mg total) by mouth 2 (two) times daily. 10/01/19   [name], MD  meclizine (ANTIVERT) 25 MG tablet Take 25 mg by mouth daily as needed for dizziness.     [provider]  ondansetron (ZOFRAN) 4 MG tablet Take 4 mg by mouth every 8 (eight) hours as needed for nausea. 12/04/18   [provider]  polyethylene glycol (MIRALAX / GLYCOLAX) packet Take 17 g by mouth daily as needed for moderate constipation.     [provider]  TRUE METRIX BLOOD GLUCOSE TEST test strip CHECK FASTING BLOOD SUGAR EVERY MORNING 08/29/19   [name], MD    Allergies Oxycodone  Family History  Problem Relation Age of Onset  . Heart disease Mother 4  . Heart attack Mother 81  . Cancer Father        colon  . Diabetes Sister   . Hypertension Sister   . Cancer Brother        kidney  . Diabetes Brother   . Cancer Daughter        ovarian  . Non-Hodgkin's lymphoma Son     Social History Social History   Tobacco Use  . Smoking status: Never Smoker  . Smokeless tobacco: Never Used  Substance Use Topics  . Alcohol use: No  . Drug use: No    Review of Systems  Constitutional: No fever/chills.  Positive for generalized weakness. Eyes: No visual changes. ENT: No sore throat. Cardiovascular: Denies chest pain. Respiratory: Denies shortness of breath. Gastrointestinal: No abdominal pain.  No nausea, no vomiting.  No diarrhea.  No constipation. Genitourinary: Negative for dysuria. Musculoskeletal: Negative for back pain. Skin: Negative for rash. Neurological: Negative for headaches, focal weakness or numbness.  ____________________________________________   PHYSICAL EXAM:  VITAL SIGNS: ED Triage Vitals  Enc Vitals Group     BP      Pulse      Resp      Temp      Temp src      SpO2      Weight      Height      Head Circumference      Peak Flow      Pain Score      Pain Loc      Pain Edu?      Excl. in [place]?     Constitutional: Alert and oriented. Eyes: Conjunctivae are normal. Head: Ecchymosis over left face. Nose: No congestion/rhinnorhea. Mouth/Throat: Mucous membranes are moist. Neck: Normal ROM  Cardiovascular: Bradycardic, irregularly irregular rhythm. Grossly normal heart sounds. Respiratory: Normal respiratory effort.  No retractions. Lungs CTAB. Gastrointestinal: Soft and nontender. No distention. Genitourinary: deferred Musculoskeletal: No lower extremity tenderness nor edema. Neurologic:  Normal speech and language. No gross focal neurologic deficits are appreciated. Skin:  Skin is warm, dry and intact. No rash noted. Psychiatric: Mood and affect are normal. Speech and behavior are normal.  ____________________________________________   LABS (all  labs ordered are listed, but only abnormal results are displayed)  Labs Reviewed  CBC WITH DIFFERENTIAL/PLATELET - Abnormal; Notable for the following components:      Result Value   WBC 11.2 (*)    Hemoglobin 11.5 (*)    Monocytes Absolute 1.2 (*)    All other components within normal limits  BASIC METABOLIC PANEL - Abnormal; Notable for the following components:   Potassium 6.6 (*)    Glucose, Bld 116 (*)    BUN 26 (*)    Creatinine, Ser 3.06 (*)    Calcium 8.2 (*)    GFR calc non Af Amer 13 (*)    GFR calc Af Amer 15 (*)    All other components within normal limits  MAGNESIUM - Abnormal; Notable for the following components:   Magnesium 1.2 (*)    All other components within normal limits  TROPONIN I (HIGH SENSITIVITY) - Abnormal; Notable for the following components:   Troponin I (High Sensitivity) 64 (*)    All other components within normal limits  TROPONIN I (HIGH SENSITIVITY) - Abnormal; Notable for the following components:   Troponin I (High Sensitivity) 58 (*)    All other components within normal limits  SARS CORONAVIRUS 2 (TAT 6-24 HRS)   ____________________________________________  EKG  ED ECG REPORT I, Blake Divine, the attending physician, personally viewed and interpreted this ECG.   Date: 10/22/2019  EKG Time: 11:15  Rate: 59  Rhythm: atrial fibrillation, rate 59  Axis: LAD   Intervals:left bundle branch block  ST&T Change: None   PROCEDURES  Procedure(s) performed (including Critical Care):  .Critical Care Performed by: Blake Divine, MD Authorized by: Blake Divine, MD   Critical care provider statement:    Critical care time (minutes):  45   Critical care time was exclusive of:  Separately billable procedures and treating other patients and teaching time   Critical care was necessary to treat or prevent imminent or life-threatening deterioration of the following conditions:  Renal failure   Critical care was time spent personally by me on the following activities:  Discussions with consultants, evaluation of patient's response to treatment, examination of patient, ordering and performing treatments and interventions, ordering and review of laboratory studies, ordering and review of radiographic studies, pulse oximetry, re-evaluation of patient's condition, obtaining history from patient or surrogate and review of old charts   I assumed direction of critical care for this patient from another provider in my specialty: no       ____________________________________________   INITIAL IMPRESSION / ASSESSMENT AND PLAN / ED COURSE       86 year old female presents to the ED for generalized weakness and bradycardia noted at PCPs office prior to arrival.  She remains bradycardic here in the ED with an irregular rhythm, however her blood pressure is stable and her mentation seems intact.  Lab work is significant for hyperkalemia along with AKI, with her elevated potassium likely the cause of her bradycardia.  Her low-dose Coreg and amiodarone seem less likely to be contributing.  We will give a dose of calcium gluconate as well as high-dose albuterol, insulin, and bicarb to address her hyperkalemia.  Case discussed with nephrology, who agrees with plan and will continue to follow.  Case also discussed with cardiology, who will follow, but there does not appear  to be an indication for pacemaker placement currently.  Case discussed with hospitalist, who accepts patient for admission.      ____________________________________________   FINAL CLINICAL IMPRESSION(S) /  ED DIAGNOSES  Final diagnoses:  Bradycardia  Hyperkalemia  Atrial fibrillation, unspecified type [facility])     ED Discharge Orders    None       Note:  This document was prepared using Dragon voice recognition software and may include unintentional dictation errors.   Blake Divine, MD 10/22/19 [phone]

## 2019-10-22 NOTE — ED Notes (Signed)
ED TO INPATIENT HANDOFF REPORT  ED Nurse Name and Phone #: Karena Addison [phone]  D Name/Age/Gender Diane Mullins 86 y.o. female Room/Bed: ED19A/ED19A  Code Status   Code Status: Prior  Home/SNF/Other Nursing Home Patient oriented to: self, place, time and situation Is this baseline? Yes   Triage Complete: Triage complete  Chief Complaint Bradycardia [R00.1]  Triage Note Pt arrived via [facility] from [place] with weakness and bradycardia. Pt went for a follow up for a fall this past Mon when staff noticed her low heart rate and that she was weak and not feeling like herself. Pt has had cough for a few months now.    Allergies Allergies  Allergen Reactions  . Oxycodone     Other reaction(s): Confusion Patient's daughter reported confusion/sensitivity to oxycodone.     Level of Care/Admitting Diagnosis ED Disposition    ED Disposition Condition [facility] Area: [place] [[id number]]  Level of Care: Telemetry [5]  Covid Evaluation: Asymptomatic Screening Protocol (No Symptoms)  Diagnosis: Bradycardia [220254]  Admitting Physician: Vashti Hey [[id number]]  Attending Physician: Vashti Hey [[id number]]  Estimated length of stay: past midnight tomorrow  Certification:: I certify this patient will need inpatient services for at least 2 midnights       B Medical/Surgery History Past Medical History:  Diagnosis Date  . Atrial fibrillation ([place])   . Benign paroxysmal positional vertigo 07/17/2014  . Chronic diastolic CHF (congestive heart failure) ([place])    a. echo 10/19: EF of 60-65%, no RWMA, Gr2DD, calcified mitral annulus with mild MR, mildly to moderately dilated left atrium, moderate TR, PASP 52 mmHg  . Diabetes mellitus   . H/O: rheumatic fever   . HOH (hard of hearing)   . Hyperlipidemia   . Hypertension   . Pulmonary hypertension ([place])   . Stroke [facility])    TIA Jan. 1st  . Teratoma of left ovary    . Thyroid disease    Past Surgical History:  Procedure Laterality Date  . ABDOMINAL HYSTERECTOMY  1973   menorrhagia  . CARDIOVERSION N/A 10/22/2018   Procedure: CARDIOVERSION;  Surgeon: Nelva Bush, MD;  Location: [facility] ORS;  Service: Cardiovascular;  Laterality: N/A;  . CARDIOVERSION N/A 11/26/2018   Procedure: CARDIOVERSION (CATH LAB);  Surgeon: Minna Merritts, MD;  Location: [facility] ORS;  Service: Cardiovascular;  Laterality: N/A;  . COLONOSCOPY WITH PROPOFOL N/A 12/16/2018   Procedure: COLONOSCOPY WITH PROPOFOL;  Surgeon: Lin Landsman, MD;  [facility] ENDOSCOPY;  Service: Gastroenterology;  Laterality: N/A;  . ENTEROSCOPY N/A 12/16/2018   Procedure: Push ENTEROSCOPY;  Surgeon: Lin Landsman, MD;  Location: [facility] ENDOSCOPY;  Service: Gastroenterology;  Laterality: N/A;  . ESOPHAGOGASTRODUODENOSCOPY N/A 06/18/2019   Procedure: ESOPHAGOGASTRODUODENOSCOPY (EGD);  Surgeon: Lin Landsman, MD;  Location: [facility] ENDOSCOPY;  Service: Gastroenterology;  Laterality: N/A;  . ESOPHAGOGASTRODUODENOSCOPY (EGD) WITH PROPOFOL N/A 12/14/2018   Procedure: ESOPHAGOGASTRODUODENOSCOPY (EGD) WITH PROPOFOL;  Surgeon: Lucilla Lame, MD;  Location: [facility] ENDOSCOPY;  Service: Endoscopy;  Laterality: N/A;  . GIVENS CAPSULE STUDY N/A 06/18/2019   Procedure: GIVENS CAPSULE STUDY;  Surgeon: Lin Landsman, MD;  Location: [facility] ENDOSCOPY;  Service: Gastroenterology;  Laterality: N/A;  . HERNIA REPAIR  1994  . ORIF ANKLE FRACTURE Right 10/29/2015   Procedure: OPEN REDUCTION INTERNAL FIXATION (ORIF) ANKLE FRACTURE;  Surgeon: Thornton Park, MD;  Location: [facility] ORS;  Service: Orthopedics;  Laterality: Right;  . TEE WITHOUT CARDIOVERSION N/A 10/22/2018   Procedure: TRANSESOPHAGEAL ECHOCARDIOGRAM (  TEE);  Surgeon: Nelva Bush, MD;  Location: [facility] ORS;  Service: Cardiovascular;  Laterality: N/A;  . VAGINAL DELIVERY     x4     A IV Location/Drains/Wounds Patient Lines/Drains/Airways Status    Active Line/Drains/Airways    Name:   Placement date:   Placement time:   Site:   Days:   Peripheral IV 10/22/19 Left Antecubital   10/22/19    1123    Antecubital   less than 1   Airway   06/18/19    1200     126          Intake/Output Last 24 hours No intake or output data in the 24 hours ending 10/22/19 1641  Labs/Imaging Results for orders placed or performed during the hospital encounter of 10/22/19 (from the past 48 hour(s))  CBC with Differential     Status: Abnormal   Collection Time: 10/22/19 11:19 AM  Result Value Ref Range   WBC 11.2 (H) 4.0 - 10.5 K/uL   RBC 3.96 3.87 - 5.11 MIL/uL   Hemoglobin 11.5 (L) 12.0 - 15.0 g/dL   HCT 36.8 36.0 - 46.0 %   MCV 92.9 80.0 - 100.0 fL   MCH 29.0 26.0 - 34.0 pg   MCHC 31.3 30.0 - 36.0 g/dL   RDW 15.4 11.5 - 15.5 %   Platelets 244 150 - 400 K/uL   nRBC 0.0 0.0 - 0.2 %   Neutrophils Relative % 67 %   Neutro Abs 7.6 1.7 - 7.7 K/uL   Lymphocytes Relative 21 %   Lymphs Abs 2.3 0.7 - 4.0 K/uL   Monocytes Relative 11 %   Monocytes Absolute 1.2 (H) 0.1 - 1.0 K/uL   Eosinophils Relative 0 %   Eosinophils Absolute 0.0 0.0 - 0.5 K/uL   Basophils Relative 1 %   Basophils Absolute 0.1 0.0 - 0.1 K/uL   Immature Granulocytes 0 %   Abs Immature Granulocytes 0.02 0.00 - 0.07 K/uL    Comment: Performed at [facility], [address] [place], [place] [address]  Basic metabolic panel     Status: Abnormal   Collection Time: 10/22/19 11:19 AM  Result Value Ref Range   Sodium 138 135 - 145 mmol/L   Potassium 6.6 (HH) 3.5 - 5.1 mmol/L    Comment: CRITICAL RESULT CALLED TO, READ BACK BY AND VERIFIED WITH JESSICA COLTRAIN@1200  ON 10/22/19 BY HKP    Chloride 100 98 - 111 mmol/L   CO2 25 22 - 32 mmol/L   Glucose, Bld 116 (H) 70 - 99 mg/dL   BUN 26 (H) 8 - 23 mg/dL   Creatinine, Ser 3.06 (H) 0.44 - 1.00 mg/dL   Calcium 8.2 (L) 8.9 - 10.3 mg/dL   GFR calc non Af Amer 13 (L) >60 mL/min   GFR calc Af Amer 15 (L) >60 mL/min   Anion  gap 13 5 - 15    Comment: Performed at [facility], [address] [place], [place] [address]  Troponin I (High Sensitivity)     Status: Abnormal   Collection Time: 10/22/19 11:19 AM  Result Value Ref Range   Troponin I (High Sensitivity) 64 (H) <18 ng/L    Comment: (NOTE) Elevated high sensitivity troponin I (hsTnI) values and significant  changes across serial measurements may suggest ACS but many other  chronic and acute conditions are known to elevate hsTnI results.  Refer to the "Links" section for chest pain algorithms and additional  guidance. Performed at [facility], [address], [place], [place] [address]   Magnesium     Status: Abnormal   Collection Time: 10/22/19 11:19 AM  Result Value Ref Range   Magnesium 1.2 (L) 1.7 - 2.4 mg/dL    Comment: Performed at [facility], [place], [place] [address]  Troponin I (High Sensitivity)     Status: Abnormal   Collection Time: 10/22/19  1:33 PM  Result Value Ref Range   Troponin I (High Sensitivity) 58 (H) <18 ng/L    Comment: (NOTE) Elevated high sensitivity troponin I (hsTnI) values and significant  changes across serial measurements may suggest ACS but many other  chronic and acute conditions are known to elevate hsTnI results.  Refer to the "Links" section for chest pain algorithms and additional  guidance. Performed at [facility], [address] [place], [place] [address]    DG Chest Portable 1 View  Result Date: 10/22/2019 CLINICAL DATA:  Status post fall 10/20/2019. Weakness and bradycardia today. EXAM: PORTABLE CHEST 1 VIEW COMPARISON:  Single-view of the chest 10/06/2019. PA and lateral chest 06/23/2019. FINDINGS: There is cardiomegaly without edema. No pneumothorax or pleural fluid. No acute or focal bony abnormality. IMPRESSION: Cardiomegaly without acute disease. Electronically Signed   By: Inge Rise M.D.   On: 10/22/2019 11:35    Pending  Labs Unresulted Labs (From admission, onward)    Start     Ordered   10/23/19 0500  Kappa/lambda light chains  Tomorrow morning,   STAT     10/22/19 1631   10/23/19 0500  Protein electrophoresis, serum  Tomorrow morning,   STAT     10/22/19 1631   10/23/19 0500  Parathyroid hormone, intact (no Ca)  Tomorrow morning,   STAT     10/22/19 1631   10/23/19 0500  Hepatitis B surface antibody,qualitative  Tomorrow morning,   STAT     10/22/19 1631   10/23/19 0500  Hepatitis B core antibody, IgM  Tomorrow morning,   STAT     10/22/19 1631   10/23/19 0500  Hepatitis B surface antigen  Tomorrow morning,   STAT     10/22/19 1631   10/22/19 1632  Potassium  ONCE - STAT,   STAT     10/22/19 1632   10/22/19 1631  Urinalysis, Routine w reflex microscopic  Once,   STAT     10/22/19 1631   10/22/19 1631  Protein / creatinine ratio, urine  Once,   STAT     10/22/19 1631   10/22/19 1631  Protein Electro, Random Urine  Once,   STAT     10/22/19 1631   10/22/19 1251  SARS CORONAVIRUS 2 (TAT 6-24 HRS) Nasopharyngeal Nasopharyngeal Swab  (Tier 3 (TAT 6-24 hrs))  Once,   STAT    Question Answer Comment  Is this test for diagnosis or screening Screening   Symptomatic for COVID-19 as defined by CDC No   Hospitalized for COVID-19 No   Admitted to ICU for COVID-19 No   Previously tested for COVID-19 Yes   Resident in a congregate (group) care setting No   Employed in healthcare setting No   Pregnant No      10/22/19 1250   Signed and Held  CBC  (heparin)  Once,   R    Comments: Baseline for heparin therapy IF NOT ALREADY DRAWN.  Notify MD if PLT < 100 K.    Signed and Held   Signed and Held  Creatinine, serum  (  heparin)  Once,   R    Comments: Baseline for heparin therapy IF NOT ALREADY DRAWN.    Signed and Held   Signed and Held  Basic metabolic panel  Tomorrow morning,   R     Signed and Held   Signed and Held  TSH  Once,   R     Signed and Held   Signed and Held  CBC  Tomorrow morning,   R      Signed and Held   Signed and Held  Magnesium  Tomorrow morning,   R     Signed and Held          Vitals/Pain Today's Vitals   10/22/19 1400 10/22/19 1430 10/22/19 1445 10/22/19 1500  BP: 129/60 (!) 133/45  139/61  Pulse: 65 71  68  Resp: 18 19 16 16   Temp:      TempSrc:      SpO2: 97% 97%  97%  Weight:      Height:      PainSc:        Isolation Precautions No active isolations  Medications Medications  calcium gluconate inj 10% (1 g) URGENT USE ONLY! (1 g Intravenous Given 10/22/19 1214)  albuterol (PROVENTIL) (2.5 MG/3ML) 0.083% nebulizer solution 10 mg (10 mg Nebulization Given 10/22/19 1221)  insulin aspart (novoLOG) injection 10 Units (10 Units Intravenous Given 10/22/19 1228)    And  dextrose 50 % solution 50 mL (50 mLs Intravenous Given 10/22/19 1219)  sodium bicarbonate injection 50 mEq (50 mEq Intravenous Given 10/22/19 1212)  lactated ringers bolus 1,000 mL (0 mLs Intravenous Stopped 10/22/19 1333)  sodium zirconium cyclosilicate (LOKELMA) packet 10 g (10 g Oral Given 10/22/19 1334)    Mobility walks with person assist Moderate fall risk   Focused Assessments    R Recommendations: See Admitting Provider Note  Report given to:

## 2019-10-22 NOTE — Consult Note (Signed)
[facility]  CONSULT NOTE    Date: 10/22/2019                  Patient Name:  Diane Mullins  MRN: [id number]  DOB: 1933/05/29  Age / Sex: 86 y.o., female         PCP: [name], MD                 Service Requesting Consult: Dr. Charna Archer                 Reason for Consult: Acute renal failure with hyperkalemia            History of Present Illness: Ms. RUEY STORER admitted to [facility] for symptomatic bradycardia. Was seen by her PCP this morning where she was found to have slurred speech, altered mental status and bradycardia. Patient was asked to be evaluated by the ED. Patient found to have acute renal failure, hyperkalemia and atrial fibrillation with bradycardia. Patient states she feels weaker than usual. Currently resides at [facility] in [place].   Patient was evaluated on 1/25 for fall with head injury. At that time her creatinine was above her baseline.    Medications: Outpatient medications: (Not in a hospital admission)   Current medications: No current facility-administered medications for this encounter.   Current Outpatient Medications  Medication Sig Dispense Refill  . amiodarone (PACERONE) 200 MG tablet TAKE 1 TABLET BY MOUTH EVERY DAY (Patient taking differently: Take 200 mg by mouth daily. ) 30 tablet 1  . carvedilol (COREG) 3.125 MG tablet Take 1 tablet (3.125 mg total) by mouth 2 (two) times daily. Hold for heart rate less than 60 60 tablet 11  . cyanocobalamin (,VITAMIN B-12,) 1000 MCG/ML injection Inject 1,000 mcg into the muscle every 30 (thirty) days.    . cyanocobalamin ([employer]S VITAMIN B12) 1000 MCG tablet Take 1 tablet (1,000 mcg total) by mouth daily. 90 tablet 1  . ferrous sulfate (FEROSUL) 325 (65 FE) MG tablet Take 1 tablet (325 mg total) by mouth daily. 90 tablet 1  . furosemide (LASIX) 40 MG tablet Take 0.5 tablets (20 mg total) by mouth daily. 45 tablet 0  . Insulin Detemir (LEVEMIR FLEXTOUCH) 100  UNIT/ML Pen Inject 6 Units into the skin daily. Can use abdominal and outer thigh subcutaneous tissue. (Patient taking differently: Inject 10 Units into the skin daily. Can use abdominal and outer thigh subcutaneous tissue.) 3 mL 2  . levothyroxine (SYNTHROID) 150 MCG tablet Take 1 tablet (150 mcg total) by mouth daily before breakfast. 90 tablet 1  . metFORMIN (GLUCOPHAGE) 1000 MG tablet Take 1 tablet (1,000 mg total) by mouth 2 (two) times daily with a meal. TAKE 1 TABLET TWICE DAILY  WITH  A  MEAL 180 tablet 3  . mirtazapine (REMERON) 7.5 MG tablet Take 1 tablet (7.5 mg total) by mouth at bedtime. 30 tablet 2  . ondansetron (ZOFRAN-ODT) 4 MG disintegrating tablet TAKE 1 TABLET BY MOUTH EVERY 8 HOURS AS NEEDED FOR NAUSEA OR VOMITTING (Patient taking differently: Take 4 mg by mouth every 8 (eight) hours as needed for nausea or vomiting. ) 30 tablet 0  . pantoprazole (PROTONIX) 40 MG tablet Take 1 tablet (40 mg total) by mouth daily. 90 tablet 1  . potassium chloride (KLOR-CON) 10 MEQ tablet Take 1 tablet (10 mEq total) by mouth daily. 90 tablet 1  . simvastatin (ZOCOR) 20 MG tablet TAKE 1 TABLET BY MOUTH EACH NIGHT AT  BEDTIME FOR CHOLESTEROL (Patient taking differently: Take 20 mg by mouth at bedtime. TAKE 1 TABLET BY MOUTH EACH NIGHT AT BEDTIME FOR CHOLESTEROL) 90 tablet 1  . acetaminophen (TYLENOL) 325 MG tablet Take 650 mg by mouth every 6 (six) hours as needed for mild pain or moderate pain.     . cephALEXin (KEFLEX) 500 MG capsule Take 1 capsule (500 mg total) by mouth 2 (two) times daily. 14 capsule 0  . meclizine (ANTIVERT) 25 MG tablet Take 25 mg by mouth daily as needed for dizziness.    . ondansetron (ZOFRAN) 4 MG tablet Take 4 mg by mouth every 8 (eight) hours as needed for nausea.    . polyethylene glycol (MIRALAX / GLYCOLAX) packet Take 17 g by mouth daily as needed for moderate constipation.     . TRUE METRIX BLOOD GLUCOSE TEST test strip CHECK FASTING BLOOD SUGAR EVERY MORNING 50 each 6       Allergies: Allergies  Allergen Reactions  . Oxycodone     Other reaction(s): Confusion Patient's daughter reported confusion/sensitivity to oxycodone.       Past Medical History: Past Medical History:  Diagnosis Date  . Atrial fibrillation ([place])   . Benign paroxysmal positional vertigo 07/17/2014  . Chronic diastolic CHF (congestive heart failure) ([place])    a. echo 10/19: EF of 60-65%, no RWMA, Gr2DD, calcified mitral annulus with mild MR, mildly to moderately dilated left atrium, moderate TR, PASP 52 mmHg  . Diabetes mellitus   . H/O: rheumatic fever   . HOH (hard of hearing)   . Hyperlipidemia   . Hypertension   . Pulmonary hypertension ([place])   . [facility]    TIA Jan. 1st  . Teratoma of left ovary   . Thyroid disease      Past Surgical History: Past Surgical History:  Procedure Laterality Date  . ABDOMINAL HYSTERECTOMY  1973   menorrhagia  . CARDIOVERSION N/A 10/22/2018   Procedure: CARDIOVERSION;  Surgeon: Nelva Bush, MD;  Location: [facility] ORS;  Service: Cardiovascular;  Laterality: N/A;  . CARDIOVERSION N/A 11/26/2018   Procedure: CARDIOVERSION (CATH LAB);  Surgeon: Minna Merritts, MD;  Location: [facility] ORS;  Service: Cardiovascular;  Laterality: N/A;  . COLONOSCOPY WITH PROPOFOL N/A 12/16/2018   Procedure: COLONOSCOPY WITH PROPOFOL;  Surgeon: Lin Landsman, MD;  Location: [facility] ENDOSCOPY;  Service: Gastroenterology;  Laterality: N/A;  . ENTEROSCOPY N/A 12/16/2018   Procedure: Push ENTEROSCOPY;  Surgeon: Lin Landsman, MD;  Location: [facility] ENDOSCOPY;  Service: Gastroenterology;  Laterality: N/A;  . ESOPHAGOGASTRODUODENOSCOPY N/A 06/18/2019   Procedure: ESOPHAGOGASTRODUODENOSCOPY (EGD);  Surgeon: Lin Landsman, MD;  Location: [facility] ENDOSCOPY;  Service: Gastroenterology;  Laterality: N/A;  . ESOPHAGOGASTRODUODENOSCOPY (EGD) WITH PROPOFOL N/A 12/14/2018   Procedure: ESOPHAGOGASTRODUODENOSCOPY (EGD) WITH PROPOFOL;  Surgeon: Lucilla Lame, MD;   Location: [facility] ENDOSCOPY;  Service: Endoscopy;  Laterality: N/A;  . GIVENS CAPSULE STUDY N/A 06/18/2019   Procedure: GIVENS CAPSULE STUDY;  Surgeon: Lin Landsman, MD;  Location: [facility] ENDOSCOPY;  Service: Gastroenterology;  Laterality: N/A;  . HERNIA REPAIR  1994  . ORIF ANKLE FRACTURE Right 10/29/2015   Procedure: OPEN REDUCTION INTERNAL FIXATION (ORIF) ANKLE FRACTURE;  Surgeon: Thornton Park, MD;  Location: [facility] ORS;  Service: Orthopedics;  Laterality: Right;  . TEE WITHOUT CARDIOVERSION N/A 10/22/2018   Procedure: TRANSESOPHAGEAL ECHOCARDIOGRAM (TEE);  Surgeon: Nelva Bush, MD;  Location: [facility] ORS;  Service: Cardiovascular;  Laterality: N/A;  . VAGINAL DELIVERY     x4     Family History:  Family History  Problem Relation Age of Onset  . Heart disease Mother 40  . Heart attack Mother 77  . Cancer Father        colon  . Diabetes Sister   . Hypertension Sister   . Cancer Brother        kidney  . Diabetes Brother   . Cancer Daughter        ovarian  . Non-Hodgkin's lymphoma Son      Social History: Social History   Socioeconomic History  . Marital status: Widowed    Spouse name: Not on file  . Number of children: 4  . Years of education: Not on file  . Highest education level: Not on file  Occupational History  . Not on file  Tobacco Use  . Smoking status: Never Smoker  . Smokeless tobacco: Never Used  Substance and Sexual Activity  . Alcohol use: No  . Drug use: No  . Sexual activity: Never  Other Topics Concern  . Not on file  Social History Narrative   Lives in [place] alone. No pets. Retired from [employer].   Diet: healthy   Exercise: water aerobics twice weekly, balance class   Ambulates with a walker at baseline   Social Determinants of Health   Financial Resource Strain:   . Difficulty of Paying Living Expenses: Not on file  Food Insecurity:   . Worried About Charity fundraiser in the Last Year: Not on file  . Ran Out of Food in the Last Year:  Not on file  Transportation Needs:   . Lack of Transportation (Medical): Not on file  . Lack of Transportation (Non-Medical): Not on file  Physical Activity: Inactive  . Days of Exercise per Week: 0 days  . Minutes of Exercise per Session: 0 min  Stress: No Stress Concern Present  . Feeling of Stress : Only a little  Social Connections: Somewhat Isolated  . Frequency of Communication with Friends and Family: More than three times a week  . Frequency of Social Gatherings with Friends and Family: More than three times a week  . Attends Religious Services: More than 4 times per year  . Active Member of Clubs or Organizations: No  . Attends Archivist Meetings: Never  . Marital Status: Widowed  Intimate Partner Violence: Not At Risk  . Fear of Current or Ex-Partner: No  . Emotionally Abused: No  . Physically Abused: No  . Sexually Abused: No     Review of Systems: Review of Systems  Unable to perform ROS: Medical condition    Vital Signs: Blood pressure (!) 123/50, pulse 71, temperature 98 F (36.7 C), temperature source Oral, resp. rate 17, height 5' (1.524 m), weight 90.7 kg, SpO2 96 %.  Weight trends: Filed Weights   10/22/19 1121  Weight: 90.7 kg    Physical Exam: General: NAD,   Head: Normocephalic, atraumatic. Moist oral mucosal membranes  Eyes: Anicteric, PERRL  Neck: Supple, trachea midline  Lungs:  Clear to auscultation  Heart: Irregular, bradycardia  Abdomen:  Soft, nontender,   Extremities: trace peripheral edema.  Neurologic: Nonfocal, moving all four extremities  Skin: No lesions        Lab results: Basic Metabolic Panel: Recent Labs  Lab 10/22/19 1119  NA 138  K 6.6*  CL 100  CO2 25  GLUCOSE 116*  BUN 26*  CREATININE 3.06*  CALCIUM 8.2*  MG 1.2*    Liver Function Tests: No results for input(s): AST,  ALT, ALKPHOS, BILITOT, PROT, ALBUMIN in the last 168 hours. No results for input(s): LIPASE, AMYLASE in the last 168 hours. No  results for input(s): AMMONIA in the last 168 hours.  CBC: Recent Labs  Lab 10/22/19 1119  WBC 11.2*  NEUTROABS 7.6  HGB 11.5*  HCT 36.8  MCV 92.9  PLT 244    Cardiac Enzymes: No results for input(s): CKTOTAL, CKMB, CKMBINDEX, TROPONINI in the last 168 hours.  BNP: Invalid input(s): POCBNP  CBG: No results for input(s): GLUCAP in the last 168 hours.  Microbiology: Results for orders placed or performed in visit on 09/29/19  Urine Culture     Status: Abnormal   Collection Time: 09/29/19 10:35 AM   Specimen: Urine  Result Value Ref Range Status   MICRO NUMBER: [id number]  Final   SPECIMEN QUALITY: Adequate  Final   Sample Source URINE  Final   STATUS: FINAL  Final   ISOLATE 1: Escherichia coli (A)  Final    Comment: Greater than 100,000 CFU/mL of Escherichia coli      Susceptibility   Escherichia coli - URINE CULTURE, REFLEX    AMOX/CLAVULANIC <=2 Sensitive     AMPICILLIN <=2 Sensitive     AMPICILLIN/SULBACTAM <=2 Sensitive     CEFAZOLIN* <=4 Not Reportable      * For infections other than uncomplicated UTIcaused by E. coli, K. pneumoniae or P. mirabilis:Cefazolin is resistant if MIC > or = 8 mcg/mL.(Distinguishing susceptible versus intermediatefor isolates with MIC < or = 4 mcg/mL requiresadditional testing.)For uncomplicated UTI caused by E. coli,K. pneumoniae or P. mirabilis: Cefazolin issusceptible if MIC <32 mcg/mL and predictssusceptible to the oral agents cefaclor, cefdinir,cefpodoxime, cefprozil, cefuroxime, cephalexinand loracarbef.    CEFEPIME <=1 Sensitive     CEFTRIAXONE <=1 Sensitive     CIPROFLOXACIN <=0.25 Sensitive     LEVOFLOXACIN <=0.12 Sensitive     ERTAPENEM <=0.5 Sensitive     GENTAMICIN <=1 Sensitive     IMIPENEM <=0.25 Sensitive     NITROFURANTOIN <=16 Sensitive     PIP/TAZO <=4 Sensitive     TOBRAMYCIN <=1 Sensitive     TRIMETH/SULFA* <=20 Sensitive      * For infections other than uncomplicated UTIcaused by E. coli, K. pneumoniae or P.  mirabilis:Cefazolin is resistant if MIC > or = 8 mcg/mL.(Distinguishing susceptible versus intermediatefor isolates with MIC < or = 4 mcg/mL requiresadditional testing.)For uncomplicated UTI caused by E. coli,K. pneumoniae or P. mirabilis: Cefazolin issusceptible if MIC <32 mcg/mL and predictssusceptible to the oral agents cefaclor, cefdinir,cefpodoxime, cefprozil, cefuroxime, cephalexinand loracarbef.Legend:S = Susceptible  I = IntermediateR = Resistant  NS = Not susceptible* = Not tested  NR = Not reported**NN = See antimicrobic comments    Coagulation Studies: No results for input(s): LABPROT, INR in the last 72 hours.  Urinalysis: No results for input(s): COLORURINE, LABSPEC, PHURINE, GLUCOSEU, HGBUR, BILIRUBINUR, KETONESUR, PROTEINUR, UROBILINOGEN, NITRITE, LEUKOCYTESUR in the last 72 hours.  Invalid input(s): APPERANCEUR    Imaging: DG Chest Portable 1 View  Result Date: 10/22/2019 CLINICAL DATA:  Status post fall 10/20/2019. Weakness and bradycardia today. EXAM: PORTABLE CHEST 1 VIEW COMPARISON:  Single-view of the chest 10/06/2019. PA and lateral chest 06/23/2019. FINDINGS: There is cardiomegaly without edema. No pneumothorax or pleural fluid. No acute or focal bony abnormality. IMPRESSION: Cardiomegaly without acute disease. Electronically Signed   By: Inge Rise M.D.   On: 10/22/2019 11:35     Assessment & Plan: Ms. DOMINIC RHOME is a 86 y.o. white female with diabetes  mellitus type II insulin dependent, hypertension, hypothyroidism, hyperlipidemia, BPPV, diastolic congestive heart failure, pulmonary hypertension, CVA/TIA, atrial fibrillation who was admitted to [facility] on 10/22/2019 for Bradycardia [R00.1]  1. Acute renal failure with hyperkalemia on chronic kidney disease stage IIIB: baseline creatinine of 1.18, GFR of 43 on 08/06/2019.  Chronic kidney disease is consistent with diabetic nephropathy Acute renal failure could be due to a number of reasons  2. Bradycardia:  irregular  3. Hypertension: blood pressure improved with IV fluids  Plan - Check renal ultrasound, urine studies, SPEP/UPEP - discontinue potassium chloride, carvedilol, metformin, furosemide - IV fluids     LOS: 0 Levonte Molina 2/3/20214:43 PM

## 2019-10-22 NOTE — Assessment & Plan Note (Signed)
Fall with head injury.  My concern would be that she possibly had some delayed intracranial issue that could be contributing to her slurred speech and slow response times.  She will be transported to the ED by EMS for evaluation.

## 2019-10-22 NOTE — ED Notes (Signed)
Pt placed on defib pads per Dr. Charna Archer.

## 2019-10-22 NOTE — H&P (Signed)
History and Physical:    Diane Mullins   SAY:[id number] DOB: 10/14/1932 DOA: 10/22/2019  Referring MD/provider: Dr. Charna Archer PCP: [name], MD   Patient coming from: SNF  Chief Complaint: Fall, found to be bradycardic  History of Present Illness:   Diane Mullins is an 86 y.o. female with past medical history significant for A. fib, BPV, HFpEF, DM 2, pulmonary hypertension who is sent by her PCP for bradycardia.  Patient had sustained a significant fall a week ago and had been feeling weak and tired since then.  She went for a routine appointment with her PCP where she was noted to have heart rate in the 40s and patient was brought to the ED.  In the ED patient was noted to have a potassium of 6.6 and slow A. fib with heart rate in the 40s and to have acute kidney injury.    ED Course:  The patient was treated aggressively for hyperkalemia which was thought to be the cause of her bradycardia.  She is now admitted for ongoing treatment of her bradycardia.  ROS:   ROS   Review of Systems: Patient denies fevers or chills.  She admits to a fall due to weakness earlier this week.  She denies any memory changes or ongoing headaches.  No chest pain or shortness of breath different from baseline.  No abdominal pain nausea vomiting or diarrhea.  Past Medical History:   Past Medical History:  Diagnosis Date  . Atrial fibrillation ([place])   . Benign paroxysmal positional vertigo 07/17/2014  . Chronic diastolic CHF (congestive heart failure) ([place])    a. echo 10/19: EF of 60-65%, no RWMA, Gr2DD, calcified mitral annulus with mild MR, mildly to moderately dilated left atrium, moderate TR, PASP 52 mmHg  . Diabetes mellitus   . H/O: rheumatic fever   . HOH (hard of hearing)   . Hyperlipidemia   . Hypertension   . Pulmonary hypertension ([place])   . Stroke [facility])    TIA Jan. 1st  . Teratoma of left ovary   . Thyroid disease     Past Surgical History:   Past Surgical History:    Procedure Laterality Date  . ABDOMINAL HYSTERECTOMY  1973   menorrhagia  . CARDIOVERSION N/A 10/22/2018   Procedure: CARDIOVERSION;  Surgeon: Nelva Bush, MD;  Location: [facility] ORS;  Service: Cardiovascular;  Laterality: N/A;  . CARDIOVERSION N/A 11/26/2018   Procedure: CARDIOVERSION (CATH LAB);  Surgeon: Minna Merritts, MD;  Location: [facility] ORS;  Service: Cardiovascular;  Laterality: N/A;  . COLONOSCOPY WITH PROPOFOL N/A 12/16/2018   Procedure: COLONOSCOPY WITH PROPOFOL;  Surgeon: Lin Landsman, MD;  Location: [facility] ENDOSCOPY;  Service: Gastroenterology;  Laterality: N/A;  . ENTEROSCOPY N/A 12/16/2018   Procedure: Push ENTEROSCOPY;  Surgeon: Lin Landsman, MD;  Location: [facility] ENDOSCOPY;  Service: Gastroenterology;  Laterality: N/A;  . ESOPHAGOGASTRODUODENOSCOPY N/A 06/18/2019   Procedure: ESOPHAGOGASTRODUODENOSCOPY (EGD);  Surgeon: Lin Landsman, MD;  Location: [facility] ENDOSCOPY;  Service: Gastroenterology;  Laterality: N/A;  . ESOPHAGOGASTRODUODENOSCOPY (EGD) WITH PROPOFOL N/A 12/14/2018   Procedure: ESOPHAGOGASTRODUODENOSCOPY (EGD) WITH PROPOFOL;  Surgeon: Lucilla Lame, MD;  Location: [facility] ENDOSCOPY;  Service: Endoscopy;  Laterality: N/A;  . GIVENS CAPSULE STUDY N/A 06/18/2019   Procedure: GIVENS CAPSULE STUDY;  Surgeon: Lin Landsman, MD;  Location: [facility] ENDOSCOPY;  Service: Gastroenterology;  Laterality: N/A;  . HERNIA REPAIR  1994  . ORIF ANKLE FRACTURE Right 10/29/2015   Procedure: OPEN REDUCTION INTERNAL FIXATION (ORIF) ANKLE  FRACTURE;  Surgeon: Thornton Park, MD;  Location: [facility] ORS;  Service: Orthopedics;  Laterality: Right;  . TEE WITHOUT CARDIOVERSION N/A 10/22/2018   Procedure: TRANSESOPHAGEAL ECHOCARDIOGRAM (TEE);  Surgeon: Nelva Bush, MD;  Location: [facility] ORS;  Service: Cardiovascular;  Laterality: N/A;  . VAGINAL DELIVERY     x4    Social History:   Social History   Socioeconomic History  . Marital status: Widowed    Spouse name: Not on file  .  Number of children: 4  . Years of education: Not on file  . Highest education level: Not on file  Occupational History  . Not on file  Tobacco Use  . Smoking status: Never Smoker  . Smokeless tobacco: Never Used  Substance and Sexual Activity  . Alcohol use: No  . Drug use: No  . Sexual activity: Never  Other Topics Concern  . Not on file  Social History Narrative   Lives in [place] alone. No pets. Retired from [employer].   Diet: healthy   Exercise: water aerobics twice weekly, balance class   Ambulates with a walker at baseline   Social Determinants of Health   Financial Resource Strain:   . Difficulty of Paying Living Expenses: Not on file  Food Insecurity:   . Worried About Charity fundraiser in the Last Year: Not on file  . Ran Out of Food in the Last Year: Not on file  Transportation Needs:   . Lack of Transportation (Medical): Not on file  . Lack of Transportation (Non-Medical): Not on file  Physical Activity: Inactive  . Days of Exercise per Week: 0 days  . Minutes of Exercise per Session: 0 min  Stress: No Stress Concern Present  . Feeling of Stress : Only a little  Social Connections: Somewhat Isolated  . Frequency of Communication with Friends and Family: More than three times a week  . Frequency of Social Gatherings with Friends and Family: More than three times a week  . Attends Religious Services: More than 4 times per year  . Active Member of Clubs or Organizations: No  . Attends Archivist Meetings: Never  . Marital Status: Widowed  Intimate Partner Violence: Not At Risk  . Fear of Current or Ex-Partner: No  . Emotionally Abused: No  . Physically Abused: No  . Sexually Abused: No    Allergies   Oxycodone  Family history:   Family History  Problem Relation Age of Onset  . Heart disease Mother 91  . Heart attack Mother 42  . Cancer Father        colon  . Diabetes Sister   . Hypertension Sister   . Cancer Brother        kidney  .  Diabetes Brother   . Cancer Daughter        ovarian  . Non-Hodgkin's lymphoma Son     Current Medications:   Prior to Admission medications   Medication Sig Start Date End Date Taking? Authorizing Provider  amiodarone (PACERONE) 200 MG tablet TAKE 1 TABLET BY MOUTH EVERY DAY Patient taking differently: Take 200 mg by mouth daily.  09/29/19  Yes [name], MD  carvedilol (COREG) 3.125 MG tablet Take 1 tablet (3.125 mg total) by mouth 2 (two) times daily. Hold for heart rate less than 60 08/19/19 08/18/20 Yes Sonnenberg, Angela Adam, MD  cyanocobalamin (,VITAMIN B-12,) 1000 MCG/ML injection Inject 1,000 mcg into the muscle every 30 (thirty) days.   Yes [provider]  cyanocobalamin ([employer] VITAMIN B12) 1000 MCG tablet Take 1 tablet (1,000 mcg total) by mouth daily. 08/19/19  Yes [name], MD  ferrous sulfate (FEROSUL) 325 (65 FE) MG tablet Take 1 tablet (325 mg total) by mouth daily. 07/14/19  Yes [name], MD  furosemide (LASIX) 40 MG tablet Take 0.5 tablets (20 mg total) by mouth daily. 07/30/19 10/28/19 Yes [name], MD  Insulin Detemir (LEVEMIR FLEXTOUCH) 100 UNIT/ML Pen Inject 6 Units into the skin daily. Can use abdominal and outer thigh subcutaneous tissue. Patient taking differently: Inject 10 Units into the skin daily. Can use abdominal and outer thigh subcutaneous tissue. 09/08/19 12/07/19 Yes [name], MD  levothyroxine (SYNTHROID) 150 MCG tablet Take 1 tablet (150 mcg total) by mouth daily before breakfast. 07/30/19 01/26/20 Yes [name], MD  metFORMIN (GLUCOPHAGE) 1000 MG tablet Take 1 tablet (1,000 mg total) by mouth 2 (two) times daily with a meal. TAKE 1 TABLET TWICE DAILY  WITH  A  MEAL 07/30/19  Yes [name], MD  mirtazapine (REMERON) 7.5 MG tablet Take 1 tablet (7.5 mg total) by mouth at bedtime. 09/30/19  Yes [name], MD  ondansetron (ZOFRAN-ODT) 4 MG disintegrating tablet TAKE 1 TABLET BY MOUTH EVERY 8  HOURS AS NEEDED FOR NAUSEA OR VOMITTING Patient taking differently: Take 4 mg by mouth every 8 (eight) hours as needed for nausea or vomiting.  07/30/19  Yes [name], MD  pantoprazole (PROTONIX) 40 MG tablet Take 1 tablet (40 mg total) by mouth daily. 07/30/19 01/26/20 Yes [name], MD  potassium chloride (KLOR-CON) 10 MEQ tablet Take 1 tablet (10 mEq total) by mouth daily. 08/19/19  Yes [name], MD  simvastatin (ZOCOR) 20 MG tablet TAKE 1 TABLET BY MOUTH EACH NIGHT AT BEDTIME FOR CHOLESTEROL Patient taking differently: Take 20 mg by mouth at bedtime. TAKE 1 TABLET BY MOUTH EACH NIGHT AT BEDTIME FOR CHOLESTEROL 07/30/19  Yes [name], MD  acetaminophen (TYLENOL) 325 MG tablet Take 650 mg by mouth every 6 (six) hours as needed for mild pain or moderate pain.     [provider]  cephALEXin (KEFLEX) 500 MG capsule Take 1 capsule (500 mg total) by mouth 2 (two) times daily. 10/01/19   [name], MD  meclizine (ANTIVERT) 25 MG tablet Take 25 mg by mouth daily as needed for dizziness.    [provider]  ondansetron (ZOFRAN) 4 MG tablet Take 4 mg by mouth every 8 (eight) hours as needed for nausea. 12/04/18   [provider]  polyethylene glycol (MIRALAX / GLYCOLAX) packet Take 17 g by mouth daily as needed for moderate constipation.     [provider]  TRUE METRIX BLOOD GLUCOSE TEST test strip CHECK FASTING BLOOD SUGAR EVERY MORNING 08/29/19   [name], MD    Physical Exam:   Vitals:   10/22/19 1400 10/22/19 1430 10/22/19 1445 10/22/19 1500  BP: 129/60 (!) 133/45  139/61  Pulse: 65 71  68  Resp: 18 19 16 16   Temp:      TempSrc:      SpO2: 97% 97%  97%  Weight:      Height:         Physical Exam: Blood pressure 139/61, pulse 68, temperature 98 F (36.7 C), temperature source Oral, resp. rate 16, height 5' (1.524 m), weight 90.7 kg, SpO2 97 %. Gen: Weak appearing female with bilateral raccoon eyes  which are  fading somewhat. CVS: S1-S2 clear, 2/6 systolic murmur without radiation  respiratory: Decreased air entry bilaterally likely secondary to decreased inspiratory effort.  Rales at bases GI: NABS, soft, NT, ND, no palpable masses.  LE: 2+ edema with circumferential stasis dermatitis.   Neuro: A/O x 3, Moving all extremities equally with normal strength, CN 3-12 intact, grossly nonfocal.    Data Review:    Labs: Basic Metabolic Panel: Recent Labs  Lab 10/22/19 1119  NA 138  K 6.6*  CL 100  CO2 25  GLUCOSE 116*  BUN 26*  CREATININE 3.06*  CALCIUM 8.2*  MG 1.2*   Liver Function Tests: No results for input(s): AST, ALT, ALKPHOS, BILITOT, PROT, ALBUMIN in the last 168 hours. No results for input(s): LIPASE, AMYLASE in the last 168 hours. No results for input(s): AMMONIA in the last 168 hours. CBC: Recent Labs  Lab 10/22/19 1119  WBC 11.2*  NEUTROABS 7.6  HGB 11.5*  HCT 36.8  MCV 92.9  PLT 244   Cardiac Enzymes: No results for input(s): CKTOTAL, CKMB, CKMBINDEX, TROPONINI in the last 168 hours.  BNP (last 3 results) No results for input(s): PROBNP in the last 8760 hours. CBG: No results for input(s): GLUCAP in the last 168 hours.  Urinalysis    Component Value Date/Time   COLORURINE YELLOW (A) 10/06/2019 1447   APPEARANCEUR HAZY (A) 10/06/2019 1447   LABSPEC 1.011 10/06/2019 1447   PHURINE 5.0 10/06/2019 1447   GLUCOSEU 50 (A) 10/06/2019 1447   GLUCOSEU NEGATIVE 09/29/2019 1035   HGBUR MODERATE (A) 10/06/2019 1447   BILIRUBINUR NEGATIVE 10/06/2019 1447   BILIRUBINUR negative 07/23/2017 1327   KETONESUR NEGATIVE 10/06/2019 1447   PROTEINUR NEGATIVE 10/06/2019 1447   UROBILINOGEN 0.2 09/29/2019 1035   NITRITE NEGATIVE 10/06/2019 1447   LEUKOCYTESUR SMALL (A) 10/06/2019 1447      Radiographic Studies: DG Chest Portable 1 View  Result Date: 10/22/2019 CLINICAL DATA:  Status post fall 10/20/2019. Weakness and bradycardia today. EXAM: PORTABLE CHEST  1 VIEW COMPARISON:  Single-view of the chest 10/06/2019. PA and lateral chest 06/23/2019. FINDINGS: There is cardiomegaly without edema. No pneumothorax or pleural fluid. No acute or focal bony abnormality. IMPRESSION: Cardiomegaly without acute disease. Electronically Signed   By: Inge Rise M.D.   On: 10/22/2019 11:35    EKG: Independently reviewed.  EKG shows atrial fibrillation that is almost bigeminal.  She has pauses and then a couple of beats that run together.  Average rate is 60 but in fact actual heart rate is much lower.  No acute ST-T wave changes.  Nonspecific interventricular conduction delay.  She has Q V1 through V3   Assessment/Plan:   Principal Problem:   Bradycardia Active Problems:   Hypertension   Diabetes (HCC)   (HFpEF) heart failure with preserved ejection fraction (HCC)   Atrial flutter [facility])   Fall  86 year old female presents with slow A. fib, hyperkalemia and acute kidney injury.   HYPERKALEMIA Patient received aggressive therapy in the ED  Repeat potassium is 6.0, will treat with ongoing Kayexalate. Recheck in 6 hours.  HYPOMAGENSEMIA Magnesium is quite low at 1.2, will replete and recheck  BRADYCARDIA Unclear etiology possibly secondary to K of 6.6 Amiodarone and carvedilol in the setting of worsening renal function likely contributory. Discussed with cardiology, will hold amiodarone and carvedilol  AKI Will hold Lasix, hydrate gently May have had decreased perfusion given significant bradycardia  AFLUTTER/FIB Holding carvedilol and amiodarone, discussed with cardiology Patient on telemetry Monitor for pauses  DM  Decrease insulin detemir to 6 units down from 10 units at home SSI AC at bedtime Hold Metformin  HTN Hold carvedilol given significant bradycardia  ANXIETY AND DEPRESSION Continue mirtazepine     Other information:   DVT prophylaxis: Lovenox ordered. Code Status: DNR Family Communication: SNF contacted her  family Disposition Plan: SNF Consults called: Cardiology, renal from ED Admission status: Inpatient  [facility]  If 7PM-7AM, please contact night-coverage [email or web address] Password [facility] 10/22/2019, 4:13 PM

## 2019-10-22 NOTE — ED Notes (Signed)
Date and time results received: 10/22/19 12:05 PM  (use smartphrase ".now" to insert current time)  Test: k+ Critical Value: 6.6  Name of Provider Notified: jessup  Orders Received? Or Actions Taken?: see mar

## 2019-10-22 NOTE — ED Triage Notes (Signed)
Pt arrived via [facility] from [place] with weakness and bradycardia. Pt went for a follow up for a fall this past Mon when staff noticed her low heart rate and that she was weak and not feeling like herself. Pt has had cough for a few months now.

## 2019-10-22 NOTE — Assessment & Plan Note (Signed)
Undetermined cause.  Given duration it would be less likely to represent COVID-19.  Could be related to fluid overload or other underlying infectious process.  She will be evaluated in the emergency department.

## 2019-10-22 NOTE — Assessment & Plan Note (Signed)
Given bradycardia and altered mental status will have the patient evaluated in the ED.  We are unable to complete an EKG in our office given COVID-19 protocols in a patient that has cough.  She will have complete evaluation in the ED.

## 2019-10-22 NOTE — Progress Notes (Signed)
I have discussed this pt with E. Ouma. NP, the APP on call. Pt has bradycardia, frequent pauses up to 2.35 seconds. CBG was also noted to be in the 60's, pt given juice and crackers with peanut butter and upon recheck was in the 90's. Repeat chemistries drawn, and alert value of serum K 6.4 reported to E. Ouma. Will CTM and await additional orders.

## 2019-10-22 NOTE — Progress Notes (Signed)
Tommi Rumps, MD Phone: [phone]  Diane Mullins is a 86 y.o. female who presents today for follow-up.  Patient presents with a transport person.  Fall: Patient had a fall on 10/13/2019.  She did go to the ED and have evaluation with lab work and CT imaging.  CT head with no acute abnormalities.  CT neck with no acute bony abnormalities.  She denies headache.  Today she has slurred speech that is increased from her baseline.  She is very slow to respond to questions though does respond appropriately.  Cough: Patient notes this has been going on for a couple of months.  She does note significant congestion and wheezing.  She denies shortness of breath.  She denies fevers.  The transportation person notes that patient has had a negative Covid test last week.  Bradycardia: Noted on vitals.  Confirmed on exam.  She noted some palpitations last night though not at this point.  No chest pain.  She is on carvedilol and amiodarone.  Social History   Tobacco Use  Smoking Status Never Smoker  Smokeless Tobacco Never Used     ROS see history of present illness  Objective  Physical Exam Vitals:   10/22/19 1000 10/22/19 1026  BP: (!) 90/50   Pulse: (!) 44 (!) 42  Temp: (!) 95.8 F (35.4 C)   SpO2: 95%     BP Readings from Last 3 Encounters:  10/22/19 (!) 151/68  10/22/19 (!) 90/50  10/14/19 (!) 135/51   Wt Readings from Last 3 Encounters:  10/22/19 200 lb (90.7 kg)  10/13/19 185 lb (83.9 kg)  10/06/19 150 lb (68 kg)    Physical Exam Constitutional:      General: She is not in acute distress.    Appearance: She is not diaphoretic.  HENT:     Head:     Comments: Bruising over the left portion of her face and around her nose and under bilateral eyes Cardiovascular:     Heart sounds: Normal heart sounds.     Comments: Significant bradycardia noted, sounds to be bigeminy, no murmurs Pulmonary:     Effort: Pulmonary effort is normal.     Breath sounds: Wheezing (Bilateral  lower lungs) and rales (Bilateral throughout) present.  Musculoskeletal:     Right lower leg: Edema present.     Left lower leg: Edema present.  Skin:    General: Skin is warm and dry.  Neurological:     Mental Status: She is alert.     Comments: Slurred speech noted, slow to respond to questions, PERRL, 5/5 strength bilateral biceps, triceps, hamstring, quads, plantar flexion, and dorsiflexion, 4+/5 strength bilateral grip, sensation light touch intact bilateral lower extremities and upper extremities      Assessment/Plan: Please see individual problem list.  Fall Fall with head injury.  My concern would be that she possibly had some delayed intracranial issue that could be contributing to her slurred speech and slow response times.  She will be transported to the ED by EMS for evaluation.  Cough Undetermined cause.  Given duration it would be less likely to represent COVID-19.  Could be related to fluid overload or other underlying infectious process.  She will be evaluated in the emergency department.  Bradycardia Given bradycardia and altered mental status will have the patient evaluated in the ED.  We are unable to complete an EKG in our office given COVID-19 protocols in a patient that has cough.  She will have complete evaluation in  the ED.   No orders of the defined types were placed in this encounter.   No orders of the defined types were placed in this encounter.   This visit occurred during the SARS-CoV-2 public health emergency.  Safety protocols were in place, including screening questions prior to the visit, additional usage of staff PPE, and extensive cleaning of exam room while observing appropriate contact time as indicated for disinfecting solutions.    Tommi Rumps, MD [place]

## 2019-10-23 ENCOUNTER — Inpatient Hospital Stay (HOSPITAL_COMMUNITY)
Admit: 2019-10-23 | Discharge: 2019-10-23 | Disposition: A | Discharge status: Home / Self Care | Payer: PPO | Attending: Physician Assistant | Admitting: Physician Assistant

## 2019-10-23 ENCOUNTER — Ambulatory Visit: Payer: PPO | Admitting: Gastroenterology

## 2019-10-23 ENCOUNTER — Inpatient Hospital Stay: Payer: PPO

## 2019-10-23 DIAGNOSIS — I4891 Unspecified atrial fibrillation: Secondary | ICD-10-CM

## 2019-10-23 DIAGNOSIS — I483 Typical atrial flutter: Secondary | ICD-10-CM

## 2019-10-23 DIAGNOSIS — I361 Nonrheumatic tricuspid (valve) insufficiency: Secondary | ICD-10-CM

## 2019-10-23 DIAGNOSIS — I340 Nonrheumatic mitral (valve) insufficiency: Secondary | ICD-10-CM

## 2019-10-23 DIAGNOSIS — N1831 Chronic kidney disease, stage 3a: Secondary | ICD-10-CM

## 2019-10-23 DIAGNOSIS — R001 Bradycardia, unspecified: Secondary | ICD-10-CM

## 2019-10-23 DIAGNOSIS — E875 Hyperkalemia: Secondary | ICD-10-CM

## 2019-10-23 DIAGNOSIS — Z794 Long term (current) use of insulin: Secondary | ICD-10-CM

## 2019-10-23 DIAGNOSIS — I10 Essential (primary) hypertension: Secondary | ICD-10-CM

## 2019-10-23 DIAGNOSIS — W19XXXA Unspecified fall, initial encounter: Secondary | ICD-10-CM

## 2019-10-23 DIAGNOSIS — N184 Chronic kidney disease, stage 4 (severe): Secondary | ICD-10-CM

## 2019-10-23 DIAGNOSIS — N179 Acute kidney failure, unspecified: Secondary | ICD-10-CM

## 2019-10-23 DIAGNOSIS — E1121 Type 2 diabetes mellitus with diabetic nephropathy: Secondary | ICD-10-CM

## 2019-10-23 LAB — BASIC METABOLIC PANEL
Anion gap: 13 (ref 5–15)
BUN: 32 mg/dL — ABNORMAL HIGH (ref 8–23)
CO2: 27 mmol/L (ref 22–32)
Calcium: 7.9 mg/dL — ABNORMAL LOW (ref 8.9–10.3)
Chloride: 99 mmol/L (ref 98–111)
Creatinine, Ser: 4.01 mg/dL — ABNORMAL HIGH (ref 0.44–1.00)
GFR calc Af Amer: 11 mL/min — ABNORMAL LOW (ref >60)
GFR calc non Af Amer: 10 mL/min — ABNORMAL LOW (ref >60)
Glucose, Bld: 80 mg/dL (ref 70–99)
Potassium: 5.2 mmol/L — ABNORMAL HIGH (ref 3.5–5.1)
Sodium: 139 mmol/L (ref 135–145)

## 2019-10-23 LAB — HEPATIC FUNCTION PANEL
ALT: 28 U/L (ref 0–44)
AST: 36 U/L (ref 15–41)
Albumin: 3 g/dL — ABNORMAL LOW (ref 3.5–5.0)
Alkaline Phosphatase: 176 U/L — ABNORMAL HIGH (ref 38–126)
Bilirubin, Direct: 0.2 mg/dL (ref 0.0–0.2)
Indirect Bilirubin: 0.3 mg/dL (ref 0.3–0.9)
Total Bilirubin: 0.5 mg/dL (ref 0.3–1.2)
Total Protein: 5.6 g/dL — ABNORMAL LOW (ref 6.5–8.1)

## 2019-10-23 LAB — CBC
HCT: 33.4 % — ABNORMAL LOW (ref 36.0–46.0)
Hemoglobin: 10.3 g/dL — ABNORMAL LOW (ref 12.0–15.0)
MCH: 29.1 pg (ref 26.0–34.0)
MCHC: 30.8 g/dL (ref 30.0–36.0)
MCV: 94.4 fL (ref 80.0–100.0)
Platelets: 201 10*3/uL (ref 150–400)
RBC: 3.54 MIL/uL — ABNORMAL LOW (ref 3.87–5.11)
RDW: 15.4 % (ref 11.5–15.5)
WBC: 7.3 10*3/uL (ref 4.0–10.5)
nRBC: 0 % (ref 0.0–0.2)

## 2019-10-23 LAB — ALBUMIN: Albumin: 2.7 g/dL — ABNORMAL LOW (ref 3.5–5.0)

## 2019-10-23 LAB — MAGNESIUM: Magnesium: 2.1 mg/dL (ref 1.7–2.4)

## 2019-10-23 LAB — HEPATITIS B SURFACE ANTIGEN: Hepatitis B Surface Ag: NONREACTIVE

## 2019-10-23 LAB — HEPATITIS B SURFACE ANTIBODY,QUALITATIVE: Hep B S Ab: NONREACTIVE

## 2019-10-23 LAB — GLUCOSE, CAPILLARY
Glucose-Capillary: 29 mg/dL — CL (ref 70–99)
Glucose-Capillary: 70 mg/dL (ref 70–99)
Glucose-Capillary: 79 mg/dL (ref 70–99)
Glucose-Capillary: 113 mg/dL — ABNORMAL HIGH (ref 70–99)
Glucose-Capillary: 139 mg/dL — ABNORMAL HIGH (ref 70–99)

## 2019-10-23 LAB — ECHOCARDIOGRAM COMPLETE
Height: 60 in
Weight: 3200 oz

## 2019-10-23 LAB — HEPATITIS B CORE ANTIBODY, IGM: Hep B C IgM: NONREACTIVE

## 2019-10-23 LAB — SARS CORONAVIRUS 2 (TAT 6-24 HRS): SARS Coronavirus 2: NEGATIVE

## 2019-10-23 IMAGING — DX PORTABLE CHEST - 1 VIEW
1 series · 1 of 1 positions shown · non-contrast
Comparison: Chest x-ray dated November 27, 2018.

CLINICAL DATA: Weakness.

EXAM:
PORTABLE CHEST 1 VIEW

[chest ap]
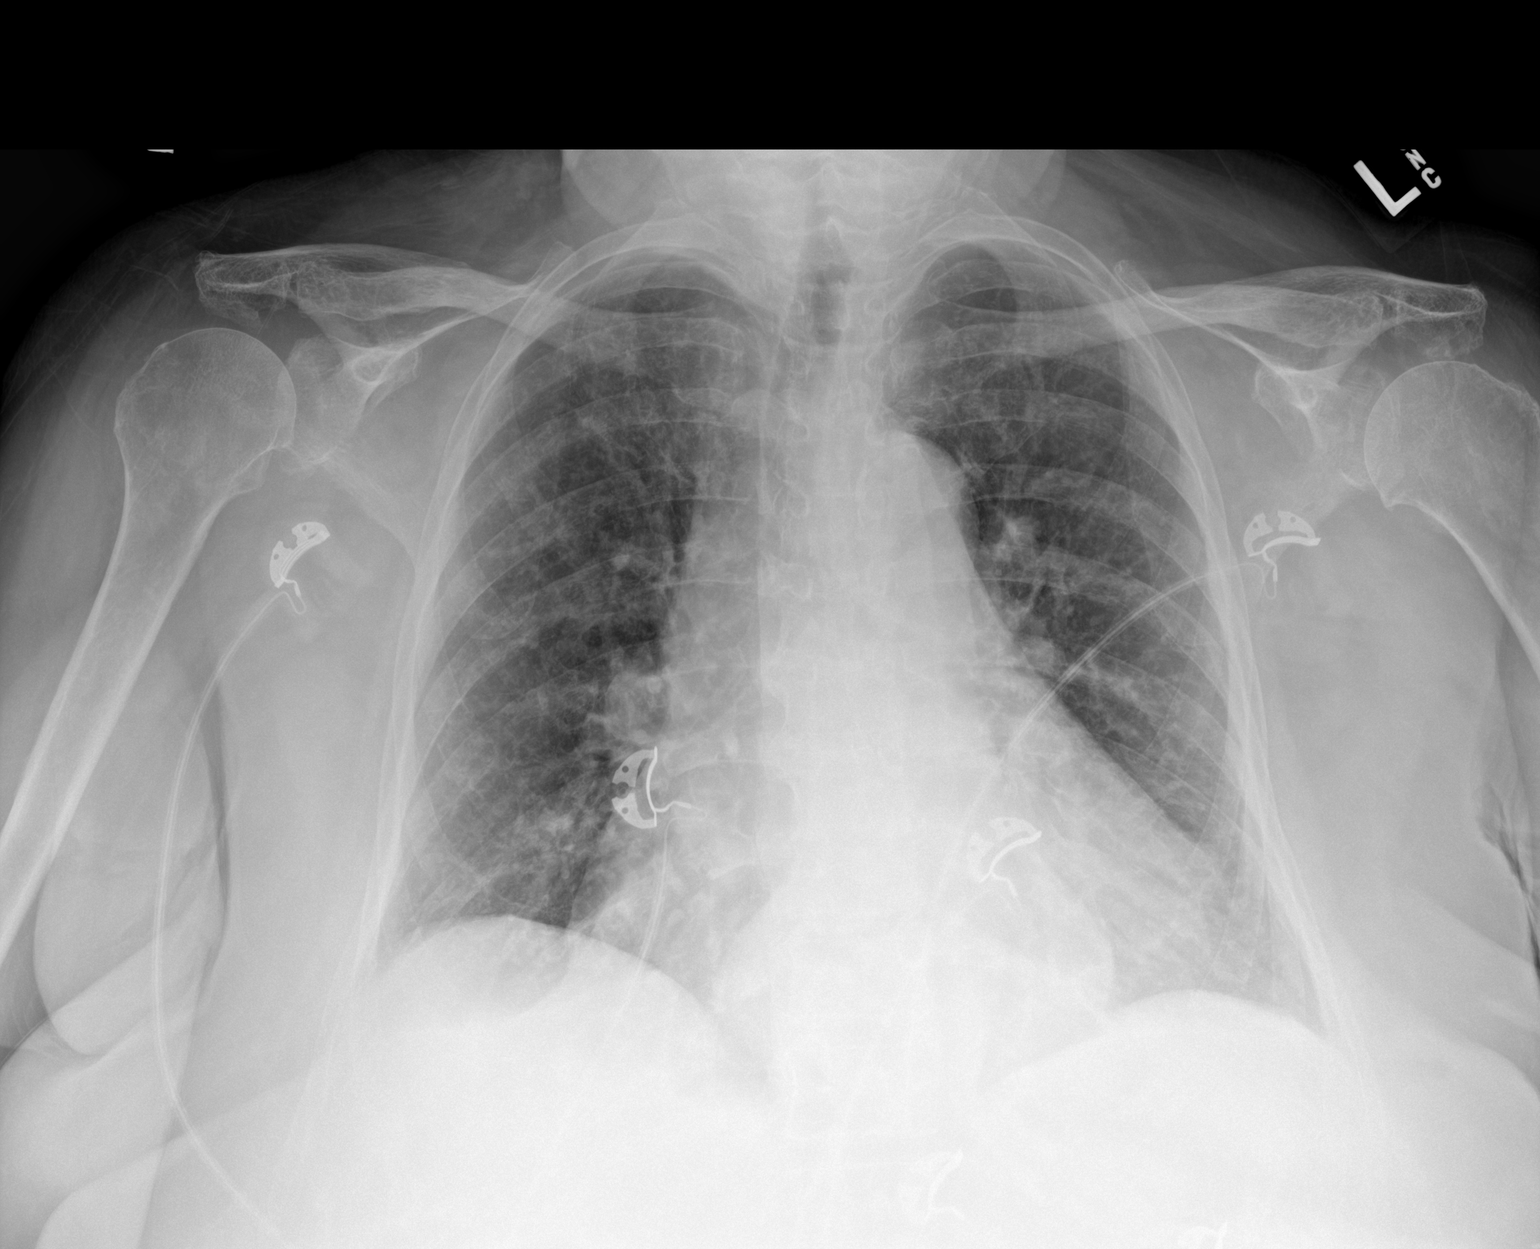

[1 of 1 positions shown; findings below may reference images not displayed]

FINDINGS: Stable cardiomegaly. Normal mediastinal contours. Unchanged
pulmonary vascular congestion. No focal consolidation, pleural
effusion, or pneumothorax. No acute osseous abnormality. Unchanged
large hiatal hernia.
IMPRESSION: 1. Chronic cardiomegaly and pulmonary vascular congestion without
overt edema.

## 2019-10-23 MED ORDER — CHLORHEXIDINE GLUCONATE CLOTH 2 % EX PADS
6.0000 | MEDICATED_PAD | Freq: Every day | CUTANEOUS | Status: DC
  Administered 2019-10-24 – 2019-10-27 (×3): 6 via TOPICAL

## 2019-10-23 MED ORDER — SODIUM BICARBONATE-DEXTROSE 150-5 MEQ/L-% IV SOLN
150.0000 meq | INTRAVENOUS | Status: DC
  Administered 2019-10-23 – 2019-10-24 (×2): 150 meq via INTRAVENOUS
  Filled 2019-10-23 (×4): qty 1000

## 2019-10-23 NOTE — Consult Note (Addendum)
Cardiology Consultation:   Patient ID: [name] MRN: [id number]; DOB: 1933/02/28  Admit date: 10/22/2019 Date of Consult: 10/23/2019  Primary Care Provider: [name], MD Primary Cardiologist: Diane Rogue, MD  Primary Electrophysiologist:  None    Patient Profile:   Diane Mullins is a 86 y.o. female with a hx of Afib/flutter with rate control complicated by bradycardia,  HFpEF/pulmonary hypertension, DM2, hypertension, hyperlipidemia, prior stroke/TIA, anemia with history of GIB, vertigo, recurrent AKI, recent fall 10/13/2019 and h/o gait instability, and who is being seen today for the evaluation of bradycardia at the request of Dr Diane Mullins.  History of Present Illness:   Diane Mullins is an 86 year old with PMH as above.  CT chest 09/2017 showed calcified LAD and RCA with mild aortic arch atherosclerosis. Subsequent NM Myoview 11/2017 that showed no significant ischemia with EF 54% and ruled a low risk study. She was seen in the hospital 06/2018 for acute respiratory distress in the setting of acute on chronic diastolic CHF, worsening anemia, diarrhea, and hypoalbuminemia. CT of the abdomen showed large left ovarian mass, consistent with teratoma. Echo showed EF 60 to 65%, G2DD, mild MR, mild to moderate LAE, moderate TR, PASP increased to 52 mmHg.  It was thought that her acute on chronic diastolic CHF was exacerbated by worsening anemia (hemoglobin of 8 with baseline 10-11), hypoalbuminemia, and gastroenteritis.  She received IV infusion and was diuresed with slight bump in her renal function. She was seen at follow-up with PFT/pulmonology referral placed.   She was seen at [facility] in early 2020 for new atrial flutter with RVR complicated by concern for bradycardic rate given her age.  Escalation of her beta-blocker and diltiazem to control elevated rates with minimal activity was precluded by lower pressures and concern for bradycardia given her age.  She underwent TEE/DCCV  10/22/2018 with subsequent bradycardic rate.  It was noted she had significant desaturation during TEE (limited study performed to exclude intraatrial thrombus).  Subsequent EKG showed junctional rhythm and lateral TWI with telemetry showing PACs. In response to her bradycardia, SA/AV nodal blocking agents were held with hope of sinus node recovery once diltiazem and metoprolol washed out.  Given she still had episodes of sinus bradycardia and junctional rhythm, confounded by brief runs of atrial tachycardia, she was seen by EP with notes indicating that catheter ablation was not indicated. EP also noted that, given her h/o palpitations, atrial flutter was likely to be recurrent and associated with atrial fibrillation as well. Rate control was not recommended. Despite her anemia, she was noted to be at exceedingly high risk for thromboembolism, and it was recommended she remain on anticoagulation with Eliquis.   Of note, on referencing 10/23/2018 cardiology progress note, it appears that she had some slurred speech on that day with patient's family say that this has been present since admission. The patient noted that it was only present following TEE/DCCV. It was suspected the anesthesia and topical lidocaine may have altered her speech. Brain MRI without acute findings.   Since that time, she has been seen in the ED several more times for GIB, anemia, HFpEF, AKI, and weakness with fall/gait instability.  05/2019 echo was performed and showed EF stable at 60 to 65% with PASP holding at 52 mmHg.  Her Eliquis was discontinued secondary to GI bleed.  She has also since been started on amiodarone and Coreg.   On 10/13/2019, she went to the [facility] ED for evaluation after a fall.  Work-up was  reassuring and without significant findings.  She was seen at follow-up today, 10/22/2019, by her PCP and found to have slurred speech and slow response time with bradycardic rates.  It was noted that she had a cough, ongoing for  several months.  She also noted significant congestion and wheezing.  She had tested negative for Covid the previous week, per transportation person at the visit.  She reported some palpitations the previous night.  No chest pain reported. No SOB.  Physical exam documented bilateral lower lung wheezing and bilateral rales, as well as bilateral lower extremity edema.  She reported her main symptom as feeling more weak than usual.  No signs or symptoms consistent with a bleeding; however, given her recent fall, there was concern noted regarding the possibility of delayed intracranial bleeding.Marland Kitchen She was sent to the [facility] ED and admitted for AKI, hyperkalemia, hypomagnesia, atrial fibrillation, and bradycardic rate.  PTA medications were noted to be amiodarone, Coreg, Lasix.  She was seen by nephrology for AOCKD and baseline creatinine noted to be 1.18.  CKD was thought likely at least in part 2/2 diabetic nephropathy.  Notes she will plan for renal ultrasound, urine studies, SPEP/UPEP.  Carvedilol, Metformin, furosemide, and potassium chloride were discontinued.  She was started on IV fluids.  Cardiology consulted today.  On exam today, the patient was noted to have bilateral ecchymosis around her eyes concerning for basilar skull fracture. It does not appear she has had a head CT / scan performed yet this admission though previous workup back in Jan.    Heart Pathway Score:     Past Medical History:  Diagnosis Date  . Atrial fibrillation ([place])   . Benign paroxysmal positional vertigo 07/17/2014  . Chronic diastolic CHF (congestive heart failure) ([place])    a. echo 10/19: EF of 60-65%, no RWMA, Gr2DD, calcified mitral annulus with mild MR, mildly to moderately dilated left atrium, moderate TR, PASP 52 mmHg  . Diabetes mellitus   . H/O: rheumatic fever   . HOH (hard of hearing)   . Hyperlipidemia   . Hypertension   . Pulmonary hypertension ([place])   . Stroke [facility])    TIA Jan. 1st  . Teratoma of left ovary     . Thyroid disease     Past Surgical History:  Procedure Laterality Date  . ABDOMINAL HYSTERECTOMY  1973   menorrhagia  . CARDIOVERSION N/A 10/22/2018   Procedure: CARDIOVERSION;  Surgeon: Nelva Bush, MD;  Location: [facility] ORS;  Service: Cardiovascular;  Laterality: N/A;  . CARDIOVERSION N/A 11/26/2018   Procedure: CARDIOVERSION (CATH LAB);  Surgeon: Minna Merritts, MD;  Location: [facility] ORS;  Service: Cardiovascular;  Laterality: N/A;  . COLONOSCOPY WITH PROPOFOL N/A 12/16/2018   Procedure: COLONOSCOPY WITH PROPOFOL;  Surgeon: Lin Landsman, MD;  Location: [facility] ENDOSCOPY;  Service: Gastroenterology;  Laterality: N/A;  . ENTEROSCOPY N/A 12/16/2018   Procedure: Push ENTEROSCOPY;  Surgeon: Lin Landsman, MD;  Location: [facility] ENDOSCOPY;  Service: Gastroenterology;  Laterality: N/A;  . ESOPHAGOGASTRODUODENOSCOPY N/A 06/18/2019   Procedure: ESOPHAGOGASTRODUODENOSCOPY (EGD);  Surgeon: Lin Landsman, MD;  Location: [facility] ENDOSCOPY;  Service: Gastroenterology;  Laterality: N/A;  . ESOPHAGOGASTRODUODENOSCOPY (EGD) WITH PROPOFOL N/A 12/14/2018   Procedure: ESOPHAGOGASTRODUODENOSCOPY (EGD) WITH PROPOFOL;  Surgeon: Lucilla Lame, MD;  Location: [facility] ENDOSCOPY;  Service: Endoscopy;  Laterality: N/A;  . GIVENS CAPSULE STUDY N/A 06/18/2019   Procedure: GIVENS CAPSULE STUDY;  Surgeon: Lin Landsman, MD;  Location: [facility] ENDOSCOPY;  Service: Gastroenterology;  Laterality: N/A;  .  HERNIA REPAIR  1994  . ORIF ANKLE FRACTURE Right 10/29/2015   Procedure: OPEN REDUCTION INTERNAL FIXATION (ORIF) ANKLE FRACTURE;  Surgeon: Thornton Park, MD;  Location: [facility] ORS;  Service: Orthopedics;  Laterality: Right;  . TEE WITHOUT CARDIOVERSION N/A 10/22/2018   Procedure: TRANSESOPHAGEAL ECHOCARDIOGRAM (TEE);  Surgeon: Nelva Bush, MD;  Location: [facility] ORS;  Service: Cardiovascular;  Laterality: N/A;  . VAGINAL DELIVERY     x4     Home Medications:  Prior to Admission medications   Medication Sig  Start Date End Date Taking? Authorizing Provider  amiodarone (PACERONE) 200 MG tablet TAKE 1 TABLET BY MOUTH EVERY DAY Patient taking differently: Take 200 mg by mouth daily.  09/29/19  Yes Diane Haven, MD  carvedilol (COREG) 3.125 MG tablet Take 1 tablet (3.125 mg total) by mouth 2 (two) times daily. Hold for heart rate less than 60 08/19/19 08/18/20 Yes Sonnenberg, Angela Adam, MD  cyanocobalamin (,VITAMIN B-12,) 1000 MCG/ML injection Inject 1,000 mcg into the muscle every 30 (thirty) days.   Yes [provider]  cyanocobalamin ([employer] VITAMIN B12) 1000 MCG tablet Take 1 tablet (1,000 mcg total) by mouth daily. 08/19/19  Yes Diane Haven, MD  ferrous sulfate (FEROSUL) 325 (65 FE) MG tablet Take 1 tablet (325 mg total) by mouth daily. 07/14/19  Yes Diane Haven, MD  furosemide (LASIX) 40 MG tablet Take 0.5 tablets (20 mg total) by mouth daily. 07/30/19 10/28/19 Yes Diane Haven, MD  Insulin Detemir (LEVEMIR FLEXTOUCH) 100 UNIT/ML Pen Inject 6 Units into the skin daily. Can use abdominal and outer thigh subcutaneous tissue. Patient taking differently: Inject 10 Units into the skin daily. Can use abdominal and outer thigh subcutaneous tissue. 09/08/19 12/07/19 Yes Diane Haven, MD  levothyroxine (SYNTHROID) 150 MCG tablet Take 1 tablet (150 mcg total) by mouth daily before breakfast. 07/30/19 01/26/20 Yes Diane Haven, MD  metFORMIN (GLUCOPHAGE) 1000 MG tablet Take 1 tablet (1,000 mg total) by mouth 2 (two) times daily with a meal. TAKE 1 TABLET TWICE DAILY  WITH  A  MEAL 07/30/19  Yes Diane Haven, MD  mirtazapine (REMERON) 7.5 MG tablet Take 1 tablet (7.5 mg total) by mouth at bedtime. 09/30/19  Yes Diane Haven, MD  ondansetron (ZOFRAN-ODT) 4 MG disintegrating tablet TAKE 1 TABLET BY MOUTH EVERY 8 HOURS AS NEEDED FOR NAUSEA OR VOMITTING Patient taking differently: Take 4 mg by mouth every 8 (eight) hours as needed for nausea or vomiting.  07/30/19  Yes  Diane Haven, MD  pantoprazole (PROTONIX) 40 MG tablet Take 1 tablet (40 mg total) by mouth daily. 07/30/19 01/26/20 Yes Diane Haven, MD  potassium chloride (KLOR-CON) 10 MEQ tablet Take 1 tablet (10 mEq total) by mouth daily. 08/19/19  Yes Diane Haven, MD  simvastatin (ZOCOR) 20 MG tablet TAKE 1 TABLET BY MOUTH EACH NIGHT AT BEDTIME FOR CHOLESTEROL Patient taking differently: Take 20 mg by mouth at bedtime. TAKE 1 TABLET BY MOUTH EACH NIGHT AT BEDTIME FOR CHOLESTEROL 07/30/19  Yes Diane Haven, MD  acetaminophen (TYLENOL) 325 MG tablet Take 650 mg by mouth every 6 (six) hours as needed for mild pain or moderate pain.     [provider]  cephALEXin (KEFLEX) 500 MG capsule Take 1 capsule (500 mg total) by mouth 2 (two) times daily. 10/01/19   Diane Haven, MD  meclizine (ANTIVERT) 25 MG tablet Take 25 mg by mouth daily as needed for dizziness.    [provider]  ondansetron (ZOFRAN) 4 MG tablet Take 4 mg by mouth every 8 (eight) hours as needed for nausea. 12/04/18   [provider]  polyethylene glycol (MIRALAX / GLYCOLAX) packet Take 17 g by mouth daily as needed for moderate constipation.     [provider]  TRUE METRIX BLOOD GLUCOSE TEST test strip CHECK FASTING BLOOD SUGAR EVERY MORNING 08/29/19   Diane Haven, MD    Inpatient Medications: Scheduled Meds: . heparin  5,000 Units Subcutaneous Q8H  . levothyroxine  150 mcg Oral Q0600  . mirtazapine  7.5 mg Oral QHS  . pantoprazole  40 mg Oral Daily  . simvastatin  20 mg Oral QHS   Continuous Infusions: . sodium bicarbonate 150 mEq in dextrose 5% 1000 mL 150 mEq (10/23/19 1237)   PRN Meds: acetaminophen, polyethylene glycol  Allergies:    Allergies  Allergen Reactions  . Oxycodone     Other reaction(s): Confusion Patient's daughter reported confusion/sensitivity to oxycodone.     Social History:   Social History   Socioeconomic History  . Marital status:  Widowed    Spouse name: Not on file  . Number of children: 4  . Years of education: Not on file  . Highest education level: Not on file  Occupational History  . Not on file  Tobacco Use  . Smoking status: Never Smoker  . Smokeless tobacco: Never Used  Substance and Sexual Activity  . Alcohol use: No  . Drug use: No  . Sexual activity: Never  Other Topics Concern  . Not on file  Social History Narrative   Lives in [place] alone. No pets. Retired from [employer].   Diet: healthy   Exercise: water aerobics twice weekly, balance class   Ambulates with a walker at baseline   Social Determinants of Health   Financial Resource Strain:   . Difficulty of Paying Living Expenses: Not on file  Food Insecurity:   . Worried About Charity fundraiser in the Last Year: Not on file  . Ran Out of Food in the Last Year: Not on file  Transportation Needs:   . Lack of Transportation (Medical): Not on file  . Lack of Transportation (Non-Medical): Not on file  Physical Activity: Inactive  . Days of Exercise per Week: 0 days  . Minutes of Exercise per Session: 0 min  Stress: No Stress Concern Present  . Feeling of Stress : Only a little  Social Connections: Somewhat Isolated  . Frequency of Communication with Friends and Family: More than three times a week  . Frequency of Social Gatherings with Friends and Family: More than three times a week  . Attends Religious Services: More than 4 times per year  . Active Member of Clubs or Organizations: No  . Attends Archivist Meetings: Never  . Marital Status: Widowed  Intimate Partner Violence: Not At Risk  . Fear of Current or Ex-Partner: No  . Emotionally Abused: No  . Physically Abused: No  . Sexually Abused: No    Family History:    Family History  Problem Relation Age of Onset  . Heart disease Mother 23  . Heart attack Mother 38  . Cancer Father        colon  . Diabetes Sister   . Hypertension Sister   . Cancer Brother         kidney  . Diabetes Brother   . Cancer Daughter        ovarian  .  Non-Hodgkin's lymphoma Son      ROS:  Please see the history of present illness.  Review of Systems  Unable to perform ROS: Mental status change  Constitutional: Positive for malaise/fatigue.  Respiratory: Positive for cough and wheezing.   Cardiovascular: Positive for palpitations. Negative for chest pain.  Musculoskeletal: Positive for falls.  Neurological: Positive for dizziness.      Physical Exam/Data:   Vitals:   10/23/19 0803 10/23/19 1046 10/23/19 1217 10/23/19 1241  BP: (!) 115/50 (!) 105/44 (!) 109/43 (!) 115/37  Pulse: 96 74 (!) 40 (!) 40  Resp: 16 17 16 16   Temp: (!) 97.4 F (36.3 C)  (!) 97.4 F (36.3 C) (!) 97.4 F (36.3 C)  TempSrc: Oral  Oral Oral  SpO2:  100% 100% 100%  Weight:      Height:        Intake/Output Summary (Last 24 hours) at 10/23/2019 1319 Last data filed at 10/23/2019 1000 Gross per 24 hour  Intake 501.83 ml  Output 0 ml  Net 501.83 ml   Last 3 Weights 10/22/2019 10/22/2019 10/13/2019  Weight (lbs) 200 lb (No Data) 185 lb  Weight (kg) 90.719 kg (No Data) 83.915 kg     Body mass index is 39.06 kg/m.  General:  Elderly female, bilateral ecchymosis around the eyes noted as well as a bump to the L top of her head HEENT: normal Neck: no JVD Vascular: radial pulses 1+ bilaterally Cardiac:  normal S1, S2; bradycardic and irregular; 1/6 systolic murmur Lungs: reduced respiratory effort, trace bibasilar crackles, coarse bilateral breath sounds Abd: soft, nontender, no hepatomegaly  Ext: 1+ bilateral lower extremity edema Musculoskeletal: Bilateral ecchymosis around the eyes, noted nodule of the top left head, face and neck ecchymosis Skin: warm and dry  Neuro:  Slow responding Psych:  Fatigued  EKG:  The EKG was personally reviewed and demonstrates: Atrial fibrillation, new LBBB, LAFB, QRS 132 ms, QTc 47 ms, 59bpm. ST changes noted in the inferior and lateral leads with  previous EKG showing ST TWI in inferior leads. Telemetry:  Telemetry was personally reviewed and demonstrates: Afib with slow ventricular rate and 1 hour 6-7am in NSR  Relevant CV Studies: Echo 06/17/2019 1. Left ventricular ejection fraction, by visual estimation, is 60 to  65%. The left ventricle has normal function. Normal left ventricular size.  There is mildly increased left ventricular hypertrophy.  2. Elevated mean left atrial pressure.  3. Left ventricular diastolic Doppler parameters are consistent with  pseudonormalization pattern of LV diastolic filling.  4. Global right ventricle has normal systolic function.The right  ventricular size is normal. Mildly increased right ventricular wall  thickness.  5. Left atrial size was severely dilated.  6. Right atrial size was moderately dilated.  7. The mitral valve was not well visualized. Mild to moderate mitral  valve regurgitation.  8. The tricuspid valve is not well visualized. Tricuspid valve  regurgitation mild-moderate.  9. The aortic valve was not well visualized Aortic valve regurgitation  was not visualized by color flow Doppler. Structurally normal aortic  valve, with no evidence of sclerosis or stenosis.  10. The pulmonic valve was not well visualized. Pulmonic valve  regurgitation is not visualized by color flow Doppler.  11. Moderately elevated pulmonary artery systolic pressure.  12. The inferior vena cava is normal in size with greater than 50%  respiratory variability, suggesting right atrial pressure of 3 mmHg.  13. The interatrial septum was not well visualized.   Laboratory Data:  High  Sensitivity Troponin:   Recent Labs  Lab 10/22/19 1119 10/22/19 1333  TROPONINIHS 64* 58*     Cardiac EnzymesNo results for input(s): TROPONINI in the last 168 hours. No results for input(s): TROPIPOC in the last 168 hours.  Chemistry Recent Labs  Lab 10/22/19 1119 10/22/19 1119 10/22/19 1756 10/22/19 2211  2019-11-05 0444  NA 138  --   --  139 139  K 6.6*   < > 6.0* 6.4* 5.2*  CL 100  --   --  100 99  CO2 25  --   --  21* 27  GLUCOSE 116*  --   --  111* 80  BUN 26*  --   --  29* 32*  CREATININE 3.06*  --   --  3.86* 4.01*  CALCIUM 8.2*  --   --  8.0* 7.9*  GFRNONAA 13*  --   --  10* 10*  GFRAA 15*  --   --  12* 11*  ANIONGAP 13  --   --  18* 13   < > = values in this interval not displayed.    No results for input(s): PROT, ALBUMIN, AST, ALT, ALKPHOS, BILITOT in the last 168 hours. Hematology Recent Labs  Lab 10/22/19 1119 11-05-2019 0444  WBC 11.2* 7.3  RBC 3.96 3.54*  HGB 11.5* 10.3*  HCT 36.8 33.4*  MCV 92.9 94.4  MCH 29.0 29.1  MCHC 31.3 30.8  RDW 15.4 15.4  PLT 244 201   BNPNo results for input(s): BNP, PROBNP in the last 168 hours.  DDimer No results for input(s): DDIMER in the last 168 hours.   Radiology/Studies:  US RENAL  Result Date: 2019-11-05 CLINICAL DATA:  Inpatient.  Acute kidney injury. EXAM: RENAL / URINARY TRACT ULTRASOUND COMPLETE COMPARISON:  12/11/2018 CT abdomen/pelvis FINDINGS: Right Kidney: Renal measurements: 11.4 x 5.4 x 5.2 cm = volume: 168 mL. Mildly echogenic renal parenchyma, normal thickness (10 mm). No hydronephrosis. No renal mass. Left Kidney: Renal measurements: 10.8 x 5.4 x 4.8 cm = volume: 149 mL. Mildly echogenic renal parenchyma, normal thickness (10 mm). No hydronephrosis. No renal mass. Bladder: Nearly collapsed and grossly normal. Other: None. IMPRESSION: 1. No hydronephrosis. 2. Normal size mildly echogenic kidneys, compatible with reported history of acute nonspecific renal parenchymal disease. Electronically Signed   By: Ilona Sorrel M.D.   On: Nov 05, 2019 09:08   DG Chest Portable 1 View  Result Date: 10/22/2019 CLINICAL DATA:  Status post fall 10/20/2019. Weakness and bradycardia today. EXAM: PORTABLE CHEST 1 VIEW COMPARISON:  Single-view of the chest 10/06/2019. PA and lateral chest 06/23/2019. FINDINGS: There is cardiomegaly without  edema. No pneumothorax or pleural fluid. No acute or focal bony abnormality. IMPRESSION: Cardiomegaly without acute disease. Electronically Signed   By: Inge Rise M.D.   On: 10/22/2019 11:35    Assessment and Plan:   Atrial fibrillation/flutter with bradycardic rate --Currently in atrial fibrillation with telemetry showing a brief period of 1 hour in sinus rhythm this morning. She was previously evaluated by EP due to bradycardic rates with AT in early 10/2018 as in HPI with recommendation for continued anticoagulation and to avoid AV/SA nodal blocking agents. Repeat attempt at cardioversion 11/2018 admission. She has also been started Coreg and amiodarone since that time. [place] was stopped 2/2 GI bleed. CHA2DS2VASc score of at least  8 (CHF, hypertension, age x2, DM2, stroke x2, female).  --Given her bradycardic rate, we discontinued amiodarone and beta-blocker with hope for improvement in rate with washout. Avoid SA/AV nodal  blocking agents. No current plan for cardioversion at this time. Not able to consent 2/2 AMS. Not therapeutic on anticoagulation so would also require TEE.  --Consider repeat EP consult given her recurrent atrial tachycardia and bradycardia with history of fall. Given her risk for thrombotic event, anticoagulation is indicated; however, this has been held 2/2 h/o GIB. Continue to hold as recommend repeat head CT to rule out subdural hematoma given AMS (+/- MRI to r/o stroke given her history of stroke and as she has been off of anticoagulation but found to be in recurrent atrial fibrillation). Daily CBC. Of note, no reported s/sx consistent with recurrent GIB at this time. Monitor to ensure H&H stable.    Acute on chronic diastolic heart failure Pulmonary Hypertension --Remains on nasal cannula oxygen. Previous EF 60 to 65% by echo. Repeat/Update echocardiogram to reassess EF and for acute structural changes. If EF reduced by echo, further work-up to be considered given  her  comorbid conditions. EKG with noted changes as above and new LBBB. Recommend trend HS Tn. Lower extremity edema noted on exam.  Consider LE edema 2/2 current renal function.  Also considered is likely hypoalbuminemia, given reduced oral intake.  IV diuresis precluded by soft BP allows and current renal function AKI.  Will order recheck of albumin, not yet performed this admission. Current wt 90.7 and below her previous dry weight of 92.8 kg.  Continue to monitor I's/O's, daily weights.  Daily BMET to monitor renal function and electrolytes.  Essential hypertension --BP currently soft. Continue to hold antihypertensives 2/2 soft pressures and bradycardia.  DM2 --Per IM. Last hemoglobin A1c 09/2019 6.2. Current sugars low.  Hypothyroidism --TSH 2.881. Continue Synthroid.  Anemia H/o normocytic anemia requiring IV infusions -Previously on Eliquis, which was discontinued after GI bleed. Recommend head CT to rule out subdural hematoma. As above, daily CBC recommended. She does have a history of normocytic anemia with previous admissions requiring IV infusions. No reported s/sx of acute bleed noted. Also considered is anemia of chronic dz.  Hyperkalemia, hypomagnesemia  --K 5.2. Recommend close monitoring with goal 4.0. Low threshold for kayexalate if needed. Continue to monitor magnesium with goal 2.0. Will check albumin given poor oral intake recently.  HLD --Recommend continue statin for risk factor modification. This appears to have been held at admission. Will recheck liver function before restart.  History of Fall --As above, recommend repeat head CT to r/o subdural hematoma. She is noted to have altered mental status as above.   History of stroke --As above, consider MRI to rule out stroke given the altered mental status with history of stroke not on anticoagulation found in atrial fibrillation.   For questions or updates, please contact [place] Please consult [email or web address] for  contact info under     Signed, Arvil Chaco, PA-C  10/23/2019 1:19 PM

## 2019-10-23 NOTE — Progress Notes (Signed)
Daughter updated on pt status

## 2019-10-23 NOTE — Progress Notes (Signed)
[facility]  ROUNDING NOTE   Subjective:   No complaints. Pain well controlled.   Potassium 5.2 (6.6)  Objective:  Vital signs in last 24 hours:  Temp:  [97.4 F (36.3 C)-98.4 F (36.9 C)] 98.4 F (36.9 C) (02/04 1557) Pulse Rate:  [40-96] 40 (02/04 1557) Resp:  [15-20] 18 (02/04 1557) BP: (103-121)/(36-50) 104/36 (02/04 1557) SpO2:  [99 %-100 %] 99 % (02/04 1557) FiO2 (%):  [2 %] 2 % (02/04 1540)  Weight change:  Filed Weights   10/22/19 1121  Weight: 90.7 kg    Intake/Output: I/O last 3 completed shifts: In: 381.8 [P.O.:340; IV Piggyback:41.8] Out: 0    Intake/Output this shift:  Total I/O In: 339 [P.O.:220; I.V.:119] Out: 0   Physical Exam: General: NAD,   Head: Normocephalic, atraumatic. Moist oral mucosal membranes  Eyes: Anicteric, PERRL  Neck: Supple, trachea midline  Lungs:  Clear to auscultation  Heart: bradycardia  Abdomen:  Soft, nontender,   Extremities:  no peripheral edema.  Neurologic: Nonfocal, moving all four extremities  Skin: No lesions        Basic Metabolic Panel: Recent Labs  Lab 10/22/19 1119 10/22/19 1756 10/22/19 2211 10/23/19 0444  NA 138  --  139 139  K 6.6* 6.0* 6.4* 5.2*  CL 100  --  100 99  CO2 25  --  21* 27  GLUCOSE 116*  --  111* 80  BUN 26*  --  29* 32*  CREATININE 3.06*  --  3.86* 4.01*  CALCIUM 8.2*  --  8.0* 7.9*  MG 1.2*  --  1.6* 2.1    Liver Function Tests: Recent Labs  Lab 10/23/19 0444  AST 36  ALT 28  ALKPHOS 176*  BILITOT 0.5  PROT 5.6*  ALBUMIN 3.0*  2.7*   No results for input(s): LIPASE, AMYLASE in the last 168 hours. No results for input(s): AMMONIA in the last 168 hours.  CBC: Recent Labs  Lab 10/22/19 1119 10/23/19 0444  WBC 11.2* 7.3  NEUTROABS 7.6  --   HGB 11.5* 10.3*  HCT 36.8 33.4*  MCV 92.9 94.4  PLT 244 201    Cardiac Enzymes: No results for input(s): CKTOTAL, CKMB, CKMBINDEX, TROPONINI in the last 168 hours.  BNP: Invalid input(s):  POCBNP  CBG: Recent Labs  Lab 10/22/19 2124 10/23/19 0420 10/23/19 0441 10/23/19 0804 10/23/19 1708  GLUCAP 94 29* 70 26 113*    Microbiology: Results for orders placed or performed during the hospital encounter of 10/22/19  SARS CORONAVIRUS 2 (TAT 6-24 HRS) Nasopharyngeal Nasopharyngeal Swab     Status: None   Collection Time: 10/22/19  1:33 PM   Specimen: Nasopharyngeal Swab  Result Value Ref Range Status   SARS Coronavirus 2 NEGATIVE NEGATIVE Final    Comment: (NOTE) SARS-CoV-2 target nucleic acids are NOT DETECTED. The SARS-CoV-2 RNA is generally detectable in upper and lower respiratory specimens during the acute phase of infection. Negative results do not preclude SARS-CoV-2 infection, do not rule out co-infections with other pathogens, and should not be used as the sole basis for treatment or other patient management decisions. Negative results must be combined with clinical observations, patient history, and epidemiological information. The expected result is Negative. Fact Sheet for Patients: SugarRoll.be Fact Sheet for Healthcare Providers: https://www.woods-mathews.com/ This test is not yet approved or cleared by the [place] FDA and  has been authorized for detection and/or diagnosis of SARS-CoV-2 by FDA under an Emergency Use Authorization (EUA). This EUA will remain  in  effect (meaning this test can be used) for the duration of the COVID-19 declaration under Section 56 4(b)(1) of the Act, 21 U.S.C. section 360bbb-3(b)(1), unless the authorization is terminated or revoked sooner. Performed at [facility], [place] [address], [place], [place] [address]     Coagulation Studies: No results for input(s): LABPROT, INR in the last 72 hours.  Urinalysis: No results for input(s): COLORURINE, LABSPEC, PHURINE, GLUCOSEU, HGBUR, BILIRUBINUR, KETONESUR, PROTEINUR, UROBILINOGEN, NITRITE, LEUKOCYTESUR in the last 72  hours.  Invalid input(s): APPERANCEUR    Imaging: US RENAL  Result Date: 10/23/2019 CLINICAL DATA:  Inpatient.  Acute kidney injury. EXAM: RENAL / URINARY TRACT ULTRASOUND COMPLETE COMPARISON:  12/11/2018 CT abdomen/pelvis FINDINGS: Right Kidney: Renal measurements: 11.4 x 5.4 x 5.2 cm = volume: 168 mL. Mildly echogenic renal parenchyma, normal thickness (10 mm). No hydronephrosis. No renal mass. Left Kidney: Renal measurements: 10.8 x 5.4 x 4.8 cm = volume: 149 mL. Mildly echogenic renal parenchyma, normal thickness (10 mm). No hydronephrosis. No renal mass. Bladder: Nearly collapsed and grossly normal. Other: None. IMPRESSION: 1. No hydronephrosis. 2. Normal size mildly echogenic kidneys, compatible with reported history of acute nonspecific renal parenchymal disease. Electronically Signed   By: Ilona Sorrel M.D.   On: 10/23/2019 09:08   DG Chest Portable 1 View  Result Date: 10/22/2019 CLINICAL DATA:  Status post fall 10/20/2019. Weakness and bradycardia today. EXAM: PORTABLE CHEST 1 VIEW COMPARISON:  Single-view of the chest 10/06/2019. PA and lateral chest 06/23/2019. FINDINGS: There is cardiomegaly without edema. No pneumothorax or pleural fluid. No acute or focal bony abnormality. IMPRESSION: Cardiomegaly without acute disease. Electronically Signed   By: Inge Rise M.D.   On: 10/22/2019 11:35     Medications:   . sodium bicarbonate 150 mEq in dextrose 5% 1000 mL 150 mEq (10/23/19 1237)   . heparin  5,000 Units Subcutaneous Q8H  . levothyroxine  150 mcg Oral Q0600  . mirtazapine  7.5 mg Oral QHS  . pantoprazole  40 mg Oral Daily  . simvastatin  20 mg Oral QHS   acetaminophen, polyethylene glycol  Assessment/ Plan:  Diane Mullins is a 86 y.o. white female with diabetes mellitus type II insulin dependent, hypertension, hypothyroidism, hyperlipidemia, BPPV, diastolic congestive heart failure, pulmonary hypertension, CVA/TIA, atrial fibrillation who was admitted to [facility] on  10/22/2019 for Bradycardia   1. Acute renal failure with hyperkalemia on chronic kidney disease stage IIIB: baseline creatinine of 1.18, GFR of 43 on 08/06/2019.  Chronic kidney disease is consistent with diabetic nephropathy Acute renal failure secondary to poor perfusion from bradycardia  2. Bradycardia: irregular  3. Hypertension and chronic congestive heart failure. Not in acute exacerbation Holding furosemide.   Plan - Holding carvedilol and amiodarone.  - discontinue potassium chloride, carvedilol, metformin, furosemide - IV fluids held by cardiology   LOS: 1 Derrico Zhong 2/4/20215:52 PM

## 2019-10-23 NOTE — Progress Notes (Addendum)
Received orders for IV insulin, sodium bicarb, D50, and lokelma; given as ordered-see MAR. Pt in no distress, remains alert and oriented. Pt also given IV magnesium as ordered. Previous order for 12.5 gm D50 held per discussion with E. Ouma as CBG came up to 94.   Pt also noted to have not urinated this shift, or since admission to this floor around 5:30 pm today. Bladder scan performed and found to be 0 cc.

## 2019-10-23 NOTE — Progress Notes (Signed)
Placed foley catheter around 9 pm; initially had 5 cc cloudy yellow urine. However, no further urine output is noted. Pt is receiving IVF at 75 cc per hour. There was about 10 cc in external catheter container prior to placing foley. Jonny Ruiz NP notified of low uop; will CTM.

## 2019-10-23 NOTE — Progress Notes (Signed)
Received call from [facility] as pt has HR consistently in mid 30's. Appears to be Afib with slow rate. Pt has HR in 30's with HR up into 50's this shift. Occ appears to be sinus brady with missed beats, and then junctional escape beats. Pt tolerates with no complaints. VS are stable. Pacing pads are in place on pt chest.   Serum K is down to 5.2 on am labs; Mg 2.1. Glucose at 80. Pt given juice, but she refuses food at this time. Serum Cr is up to 4.01 this am. Mentation is WNL; she is cheerful and appreciative of care. Takes po fluids without difficultyShe was given a bath as she was incontinent of urine and stool. PureWick external catheter replaced.

## 2019-10-23 NOTE — Progress Notes (Addendum)
CBG 29; gave D50 12.5 gm E. Ouma notified; will follow up in 15 min for CBG recheck and repeat D50 12.5 gm if less than CBG 60.   Follow up CBG 15 min later was 70.

## 2019-10-23 NOTE — Progress Notes (Signed)
PROGRESS NOTE    Diane Mullins  PFX:[id number] DOB: 06-Nov-1932 DOA: 10/22/2019 PCP: [name], MD    Assessment & Plan:   Principal Problem:   Bradycardia Active Problems:   Hypertension   Diabetes ([place])   (HFpEF) heart failure with preserved ejection fraction [facility])   Atrial flutter [employer])   Fall    Diane Mullins is an 86 y.o. female with past medical history significant for atrial fibrillation/flutter, s/p TEE/DCCV (2/4/2020and again November 25, 3297, diastolic heart failure, pulmonary hypertension, diabetes type 2, hypertension, prior stroke/TIA, GI bleed March 2020 , recent fall October 13, 2019 gait instability presenting to the hospital with weakness, bradycardia.   BRADYCARDIA In the setting of hyperkalemia, acute renal failure, also on beta-blocker with amiodarone at home. Beta-blocker amiodarone held since admission --needs 2-1/2-day washout --No indication for pacing at this time.  Blood pressure borderline low --cardiology rec dopamine infusion for any further hemodynamic instability or worsening mental status  AKI --Minimum urine output since presentation. --Nephrology consulted, started MIVF@75  --Insert Foley cath for close monitoring of urine output --Hold home lasix  Hypoglycemia DM2 on insulin --Noted multiple low BG readings.  A1c from sept 2020 and Jan 2021 both wnl.  Pt is on Levemir 10u daily and Metformin at home. PLAN: --d/c insulin --Hold home metformin --BG check for another day to monitor for further hypoglycemia  HYPERKALEMIA, improved --s/p Kayexalate.  HYPOMAGENSEMIA Magnesium is quite low at 1.2,  replete and recheck  Hx of AFLUTTER/FIB Holding carvedilol and amiodarone, discussed with cardiology Patient on telemetry Monitor for pauses  HTN Hold carvedilol given significant bradycardia Hold home lasix 2/2 AKI  ANXIETY AND DEPRESSION Continue mirtazepine    DVT prophylaxis: Heparin SQ Code Status: DNR  Family  Communication: not today Disposition Plan: undetermined at this time   Subjective and Interval History:  Pt had no complaints today.  No fever, dyspnea, chest pain, abdominal pain, N/V/D.  Was up in a chair and walked with nursing assistance.     Objective: Vitals:   10/23/19 1217 10/23/19 1241 10/23/19 1540 10/23/19 1557  BP: (!) 109/43 (!) 115/37 (!) 103/42 (!) 104/36  Pulse: (!) 40 (!) 40 (!) 41 (!) 40  Resp: 16 16 15 18   Temp: (!) 97.4 F (36.3 C) (!) 97.4 F (36.3 C) (!) 97.4 F (36.3 C) 98.4 F (36.9 C)  TempSrc: Oral Oral Oral Oral  SpO2: 100% 100% 99% 99%  Weight:      Height:        Intake/Output Summary (Last 24 hours) at 10/23/2019 1924 Last data filed at 10/23/2019 1900 Gross per 24 hour  Intake 920.78 ml  Output 0 ml  Net 920.78 ml   Filed Weights   10/22/19 1121  Weight: 90.7 kg    Examination:   Constitutional: NAD, AAOx3, lethargic, but clear headed HEENT: conjunctivae and lids normal, EOMI, bump on the top of the scalp, extensive bruising around her eyes. CV: RRR bradycardic. Distal pulses +2.  No cyanosis.   RESP: CTA B/L, normal respiratory effort, on 2L GI: +BS, NTND Extremities: No effusions, edema, or tenderness in BLE SKIN: warm, dry and intact Neuro: II - XII grossly intact.  Sensation intact Psych: Flat mood and affect.  Appropriate judgement and reason   Data Reviewed: I have personally reviewed following labs and imaging studies  CBC: Recent Labs  Lab 10/22/19 1119 10/23/19 0444  WBC 11.2* 7.3  NEUTROABS 7.6  --   HGB 11.5* 10.3*  HCT 36.8 33.4*  MCV 92.9 94.4  PLT 244 161   Basic Metabolic Panel: Recent Labs  Lab 10/22/19 1119 10/22/19 1756 10/22/19 2211 10/23/19 0444  NA 138  --  139 139  K 6.6* 6.0* 6.4* 5.2*  CL 100  --  100 99  CO2 25  --  21* 27  GLUCOSE 116*  --  111* 80  BUN 26*  --  29* 32*  CREATININE 3.06*  --  3.86* 4.01*  CALCIUM 8.2*  --  8.0* 7.9*  MG 1.2*  --  1.6* 2.1   GFR: Estimated Creatinine  Clearance: 10.3 mL/min (A) (by C-G formula based on SCr of 4.01 mg/dL (H)). Liver Function Tests: Recent Labs  Lab 10/23/19 0444  AST 36  ALT 28  ALKPHOS 176*  BILITOT 0.5  PROT 5.6*  ALBUMIN 3.0*  2.7*   No results for input(s): LIPASE, AMYLASE in the last 168 hours. No results for input(s): AMMONIA in the last 168 hours. Coagulation Profile: No results for input(s): INR, PROTIME in the last 168 hours. Cardiac Enzymes: No results for input(s): CKTOTAL, CKMB, CKMBINDEX, TROPONINI in the last 168 hours. BNP (last 3 results) No results for input(s): PROBNP in the last 8760 hours. HbA1C: No results for input(s): HGBA1C in the last 72 hours. CBG: Recent Labs  Lab 10/22/19 2124 10/23/19 0420 10/23/19 0441 10/23/19 0804 10/23/19 1708  GLUCAP 94 29* 70 79 113*   Lipid Profile: No results for input(s): CHOL, HDL, LDLCALC, TRIG, CHOLHDL, LDLDIRECT in the last 72 hours. Thyroid Function Tests: Recent Labs    10/22/19 1756  TSH 2.881   Anemia Panel: No results for input(s): VITAMINB12, FOLATE, FERRITIN, TIBC, IRON, RETICCTPCT in the last 72 hours. Sepsis Labs: No results for input(s): PROCALCITON, LATICACIDVEN in the last 168 hours.  Recent Results (from the past 240 hour(s))  SARS CORONAVIRUS 2 (TAT 6-24 HRS) Nasopharyngeal Nasopharyngeal Swab     Status: None   Collection Time: 10/22/19  1:33 PM   Specimen: Nasopharyngeal Swab  Result Value Ref Range Status   SARS Coronavirus 2 NEGATIVE NEGATIVE Final    Comment: (NOTE) SARS-CoV-2 target nucleic acids are NOT DETECTED. The SARS-CoV-2 RNA is generally detectable in upper and lower respiratory specimens during the acute phase of infection. Negative results do not preclude SARS-CoV-2 infection, do not rule out co-infections with other pathogens, and should not be used as the sole basis for treatment or other patient management decisions. Negative results must be combined with clinical observations, patient history, and  epidemiological information. The expected result is Negative. Fact Sheet for Patients: SugarRoll.be Fact Sheet for Healthcare Providers: https://www.woods-mathews.com/ This test is not yet approved or cleared by the [place] FDA and  has been authorized for detection and/or diagnosis of SARS-CoV-2 by FDA under an Emergency Use Authorization (EUA). This EUA will remain  in effect (meaning this test can be used) for the duration of the COVID-19 declaration under Section 56 4(b)(1) of the Act, 21 U.S.C. section 360bbb-3(b)(1), unless the authorization is terminated or revoked sooner. Performed at [facility], [place] [address], [place], [place] [address]       Radiology Studies: US RENAL  Result Date: 10/23/2019 CLINICAL DATA:  Inpatient.  Acute kidney injury. EXAM: RENAL / URINARY TRACT ULTRASOUND COMPLETE COMPARISON:  12/11/2018 CT abdomen/pelvis FINDINGS: Right Kidney: Renal measurements: 11.4 x 5.4 x 5.2 cm = volume: 168 mL. Mildly echogenic renal parenchyma, normal thickness (10 mm). No hydronephrosis. No renal mass. Left Kidney: Renal measurements: 10.8 x 5.4  x 4.8 cm = volume: 149 mL. Mildly echogenic renal parenchyma, normal thickness (10 mm). No hydronephrosis. No renal mass. Bladder: Nearly collapsed and grossly normal. Other: None. IMPRESSION: 1. No hydronephrosis. 2. Normal size mildly echogenic kidneys, compatible with reported history of acute nonspecific renal parenchymal disease. Electronically Signed   By: Ilona Sorrel M.D.   On: 10/23/2019 09:08   DG Chest Portable 1 View  Result Date: 10/22/2019 CLINICAL DATA:  Status post fall 10/20/2019. Weakness and bradycardia today. EXAM: PORTABLE CHEST 1 VIEW COMPARISON:  Single-view of the chest 10/06/2019. PA and lateral chest 06/23/2019. FINDINGS: There is cardiomegaly without edema. No pneumothorax or pleural fluid. No acute or focal bony abnormality. IMPRESSION: Cardiomegaly without  acute disease. Electronically Signed   By: Inge Rise M.D.   On: 10/22/2019 11:35   ECHOCARDIOGRAM COMPLETE  Result Date: 10/23/2019   ECHOCARDIOGRAM REPORT   Patient Name:   Diane Mullins Date of Exam: 10/23/2019 Medical Rec #:  [id number]     Height:       60.0 in Accession #:    [id number]    Weight:       200.0 lb Date of Birth:  1932/10/24      BSA:          1.87 m Patient Age:    73 years      BP:           104/36 mmHg Patient Gender: F             HR:           42 bpm. Exam Location:  [facility] Procedure: 2D Echo Indications:     ATRIAL FIBRILLATION 427.31/I48.91  History:         Patient has prior history of Echocardiogram examinations, most                  recent 06/17/2019. TIA, Arrythmias:Atrial Flutter,                  Signs/Symptoms:Shortness of Breath; Risk Factors:Dyslipidemia                  and Diabetes.  Sonographer:     Avanell Shackleton Referring Phys:  [id number] Arvil Chaco Diagnosing Phys: Ida Rogue MD  Sonographer Comments: Technically difficult study due to poor echo windows. Image acquisition challenging due to patient body habitus. PT WAS UNABLE TO TURN ON HER LEFT SIDE IMPRESSIONS  1. Left ventricular ejection fraction, by visual estimation, is 55 to 60%. The left ventricle has normal function. There is no left ventricular hypertrophy.  2. The left ventricle has no regional wall motion abnormalities.  3. Global right ventricle has normal systolic function.The right ventricular size is mildly enlarged. No increase in right ventricular wall thickness.  4. Left atrial size was severely dilated.  5. Moderately elevated pulmonary artery systolic pressure.  6. The tricuspid regurgitant velocity is 3.45 m/s, and with an assumed right atrial pressure of 10 mmHg, the estimated right ventricular systolic pressure is moderately elevated at 57.7 mmHg.  7. Bradycardia noted, rate 41 bpm FINDINGS  Left Ventricle: Left ventricular ejection fraction, by visual estimation, is 55 to 60%. The left  ventricle has normal function. The left ventricle has no regional wall motion abnormalities. There is no left ventricular hypertrophy. Normal left atrial pressure. Right Ventricle: The right ventricular size is mildly enlarged. No increase in right ventricular wall thickness. Global RV systolic function is has normal systolic function. The tricuspid regurgitant  velocity is 3.45 m/s, and with an assumed right atrial  pressure of 10 mmHg, the estimated right ventricular systolic pressure is moderately elevated at 57.7 mmHg. Left Atrium: Left atrial size was severely dilated. Right Atrium: Right atrial size was normal in size Pericardium: There is no evidence of pericardial effusion. Mitral Valve: The mitral valve is normal in structure. Mild mitral valve regurgitation. No evidence of mitral valve stenosis by observation. Tricuspid Valve: The tricuspid valve is normal in structure. Tricuspid valve regurgitation is mild-moderate. Aortic Valve: The aortic valve was not well visualized. Aortic valve regurgitation is not visualized. The aortic valve is structurally normal, with no evidence of sclerosis or stenosis. Pulmonic Valve: The pulmonic valve was normal in structure. Pulmonic valve regurgitation is trivial. Pulmonic regurgitation is trivial. Aorta: The aortic root, ascending aorta and aortic arch are all structurally normal, with no evidence of dilitation or obstruction. Venous: The inferior vena cava is normal in size with greater than 50% respiratory variability, suggesting right atrial pressure of 3 mmHg. IAS/Shunts: No atrial level shunt detected by color flow Doppler. There is no evidence of a patent foramen ovale. No ventricular septal defect is seen or detected. There is no evidence of an atrial septal defect.  LEFT VENTRICLE PLAX 2D LVIDd:         4.07 cm LVIDs:         2.15 cm LV PW:         1.33 cm LV IVS:        1.17 cm LVOT diam:     1.90 cm LV SV:         58 ml LV SV Index:   28.68 LVOT Area:     2.84  cm  LEFT ATRIUM              Index       RIGHT ATRIUM           Index LA diam:        3.60 cm  1.93 cm/m  RA Area:     23.50 cm LA Vol (A2C):   119.0 ml 63.75 ml/m RA Volume:   81.70 ml  43.77 ml/m LA Vol (A4C):   96.0 ml  51.43 ml/m LA Biplane Vol: 111.0 ml 59.47 ml/m   AORTA Ao Root diam: 3.40 cm MR Peak grad: 78.1 mmHg   TRICUSPID VALVE MR Mean grad: 51.0 mmHg   TR Peak grad:   47.7 mmHg MR Vmax:      442.00 cm/s TR Vmax:        369.00 cm/s MR Vmean:     336.0 cm/s                           SHUNTS                           Systemic Diam: 1.90 cm  Ida Rogue MD Electronically signed by Ida Rogue MD Signature Date/Time: 10/23/2019/7:19:31 PM    Final      Scheduled Meds: . heparin  5,000 Units Subcutaneous Q8H  . levothyroxine  150 mcg Oral Q0600  . mirtazapine  7.5 mg Oral QHS  . pantoprazole  40 mg Oral Daily  . simvastatin  20 mg Oral QHS   Continuous Infusions: . sodium bicarbonate 150 mEq in dextrose 5% 1000 mL 150 mEq (10/23/19 1237)     LOS: 1 day     Otila Kluver  Billie Ruddy, MD [facility] If 7PM-7AM, please contact night-coverage 10/23/2019, 7:24 PM

## 2019-10-24 ENCOUNTER — Telehealth: Payer: Self-pay | Admitting: Family Medicine

## 2019-10-24 DIAGNOSIS — N170 Acute kidney failure with tubular necrosis: Principal | ICD-10-CM

## 2019-10-24 DIAGNOSIS — I5032 Chronic diastolic (congestive) heart failure: Secondary | ICD-10-CM

## 2019-10-24 LAB — CBC
HCT: 31.6 % — ABNORMAL LOW (ref 36.0–46.0)
Hemoglobin: 10 g/dL — ABNORMAL LOW (ref 12.0–15.0)
MCH: 29.1 pg (ref 26.0–34.0)
MCHC: 31.6 g/dL (ref 30.0–36.0)
MCV: 91.9 fL (ref 80.0–100.0)
Platelets: 207 10*3/uL (ref 150–400)
RBC: 3.44 MIL/uL — ABNORMAL LOW (ref 3.87–5.11)
RDW: 15.5 % (ref 11.5–15.5)
WBC: 6.7 10*3/uL (ref 4.0–10.5)
nRBC: 0 % (ref 0.0–0.2)

## 2019-10-24 LAB — BASIC METABOLIC PANEL
Anion gap: 15 (ref 5–15)
BUN: 38 mg/dL — ABNORMAL HIGH (ref 8–23)
CO2: 29 mmol/L (ref 22–32)
Calcium: 7.6 mg/dL — ABNORMAL LOW (ref 8.9–10.3)
Chloride: 92 mmol/L — ABNORMAL LOW (ref 98–111)
Creatinine, Ser: 5.18 mg/dL — ABNORMAL HIGH (ref 0.44–1.00)
GFR calc Af Amer: 8 mL/min — ABNORMAL LOW (ref >60)
GFR calc non Af Amer: 7 mL/min — ABNORMAL LOW (ref >60)
Glucose, Bld: 120 mg/dL — ABNORMAL HIGH (ref 70–99)
Potassium: 5.5 mmol/L — ABNORMAL HIGH (ref 3.5–5.1)
Sodium: 136 mmol/L (ref 135–145)

## 2019-10-24 LAB — PROTEIN ELECTROPHORESIS, SERUM
A/G Ratio: 1.1 (ref 0.7–1.7)
Albumin ELP: 2.7 g/dL — ABNORMAL LOW (ref 2.9–4.4)
Alpha-1-Globulin: 0.2 g/dL (ref 0.0–0.4)
Alpha-2-Globulin: 0.7 g/dL (ref 0.4–1.0)
Beta Globulin: 0.9 g/dL (ref 0.7–1.3)
Gamma Globulin: 0.7 g/dL (ref 0.4–1.8)
Globulin, Total: 2.5 g/dL (ref 2.2–3.9)
M-Spike, %: 0.2 g/dL — ABNORMAL HIGH
Total Protein ELP: 5.2 g/dL — ABNORMAL LOW (ref 6.0–8.5)

## 2019-10-24 LAB — KAPPA/LAMBDA LIGHT CHAINS
Kappa free light chain: 85.5 mg/L — ABNORMAL HIGH (ref 3.3–19.4)
Kappa, lambda light chain ratio: 1.55 (ref 0.26–1.65)
Lambda free light chains: 55.3 mg/L — ABNORMAL HIGH (ref 5.7–26.3)

## 2019-10-24 LAB — PARATHYROID HORMONE, INTACT (NO CA): PTH: 136 pg/mL — ABNORMAL HIGH (ref 15–65)

## 2019-10-24 LAB — GLUCOSE, CAPILLARY
Glucose-Capillary: 127 mg/dL — ABNORMAL HIGH (ref 70–99)
Glucose-Capillary: 137 mg/dL — ABNORMAL HIGH (ref 70–99)

## 2019-10-24 LAB — MAGNESIUM: Magnesium: 1.9 mg/dL (ref 1.7–2.4)

## 2019-10-24 MED ORDER — DOPAMINE-DEXTROSE 3.2-5 MG/ML-% IV SOLN
5.0000 ug/kg/min | INTRAVENOUS | Status: DC
  Administered 2019-10-24 – 2019-10-28 (×4): 5 ug/kg/min via INTRAVENOUS
  Filled 2019-10-24 (×4): qty 250

## 2019-10-24 NOTE — Progress Notes (Signed)
[facility]  ROUNDING NOTE   Subjective:   Starting patient on dopamine gtt and moving patient to ICU due to bradycardia.   Creatinine 5.18 (4.01) K 5.5 (5.2)  Urine output not recorded but patient states she is making urine.   Objective:  Vital signs in last 24 hours:  Temp:  [97.4 F (36.3 C)-98.7 F (37.1 C)] 98.3 F (36.8 C) (02/05 0835) Pulse Rate:  [40-43] 43 (02/05 0835) Resp:  [15-20] 16 (02/05 0835) BP: (103-118)/(36-49) 118/47 (02/05 0835) SpO2:  [99 %-100 %] 99 % (02/05 0835) FiO2 (%):  [2 %] 2 % (02/04 1540)  Weight change:  Filed Weights   10/22/19 1121  Weight: 90.7 kg    Intake/Output: I/O last 3 completed shifts: In: 2295.8 [P.O.:1000; I.V.:1254; IV Piggyback:41.8] Out: 23 [Urine:23]   Intake/Output this shift:  No intake/output data recorded.  Physical Exam: General: NAD,   Head: Normocephalic, atraumatic. Moist oral mucosal membranes  Eyes: Anicteric, PERRL  Neck: Supple, trachea midline  Lungs:  Clear to auscultation  Heart: bradycardia  Abdomen:  Soft, nontender,   Extremities:  no peripheral edema.  Neurologic: Nonfocal, moving all four extremities  Skin: No lesions        Basic Metabolic Panel: Recent Labs  Lab 10/22/19 1119 10/22/19 1119 10/22/19 1756 10/22/19 2211 10/23/19 0444 10/24/19 0609  NA 138  --   --  139 139 136  K 6.6*  --  6.0* 6.4* 5.2* 5.5*  CL 100  --   --  100 99 92*  CO2 25  --   --  21* 27 29  GLUCOSE 116*  --   --  111* 80 120*  BUN 26*  --   --  29* 32* 38*  CREATININE 3.06*  --   --  3.86* 4.01* 5.18*  CALCIUM 8.2*   < >  --  8.0* 7.9* 7.6*  MG 1.2*  --   --  1.6* 2.1 1.9   < > = values in this interval not displayed.    Liver Function Tests: Recent Labs  Lab 10/23/19 0444  AST 36  ALT 28  ALKPHOS 176*  BILITOT 0.5  PROT 5.6*  ALBUMIN 3.0*  2.7*   No results for input(s): LIPASE, AMYLASE in the last 168 hours. No results for input(s): AMMONIA in the last 168  hours.  CBC: Recent Labs  Lab 10/22/19 1119 10/23/19 0444 10/24/19 0609  WBC 11.2* 7.3 6.7  NEUTROABS 7.6  --   --   HGB 11.5* 10.3* 10.0*  HCT 36.8 33.4* 31.6*  MCV 92.9 94.4 91.9  PLT 244 201 207    Cardiac Enzymes: No results for input(s): CKTOTAL, CKMB, CKMBINDEX, TROPONINI in the last 168 hours.  BNP: Invalid input(s): POCBNP  CBG: Recent Labs  Lab 10/23/19 0804 10/23/19 1708 10/23/19 2046 10/24/19 0139 10/24/19 0833  GLUCAP 79 113* 139* 127* 137*    Microbiology: Results for orders placed or performed during the hospital encounter of 10/22/19  SARS CORONAVIRUS 2 (TAT 6-24 HRS) Nasopharyngeal Nasopharyngeal Swab     Status: None   Collection Time: 10/22/19  1:33 PM   Specimen: Nasopharyngeal Swab  Result Value Ref Range Status   SARS Coronavirus 2 NEGATIVE NEGATIVE Final    Comment: (NOTE) SARS-CoV-2 target nucleic acids are NOT DETECTED. The SARS-CoV-2 RNA is generally detectable in upper and lower respiratory specimens during the acute phase of infection. Negative results do not preclude SARS-CoV-2 infection, do not rule out co-infections with other pathogens, and  should not be used as the sole basis for treatment or other patient management decisions. Negative results must be combined with clinical observations, patient history, and epidemiological information. The expected result is Negative. Fact Sheet for Patients: SugarRoll.be Fact Sheet for Healthcare Providers: https://www.woods-mathews.com/ This test is not yet approved or cleared by the [place] FDA and  has been authorized for detection and/or diagnosis of SARS-CoV-2 by FDA under an Emergency Use Authorization (EUA). This EUA will remain  in effect (meaning this test can be used) for the duration of the COVID-19 declaration under Section 56 4(b)(1) of the Act, 21 U.S.C. section 360bbb-3(b)(1), unless the authorization is terminated or revoked  sooner. Performed at [facility], [place] [address] [place], [place] [address]     Coagulation Studies: No results for input(s): LABPROT, INR in the last 72 hours.  Urinalysis: No results for input(s): COLORURINE, LABSPEC, PHURINE, GLUCOSEU, HGBUR, BILIRUBINUR, KETONESUR, PROTEINUR, UROBILINOGEN, NITRITE, LEUKOCYTESUR in the last 72 hours.  Invalid input(s): APPERANCEUR    Imaging: US RENAL  Result Date: 10/23/2019 CLINICAL DATA:  Inpatient.  Acute kidney injury. EXAM: RENAL / URINARY TRACT ULTRASOUND COMPLETE COMPARISON:  12/11/2018 CT abdomen/pelvis FINDINGS: Right Kidney: Renal measurements: 11.4 x 5.4 x 5.2 cm = volume: 168 mL. Mildly echogenic renal parenchyma, normal thickness (10 mm). No hydronephrosis. No renal mass. Left Kidney: Renal measurements: 10.8 x 5.4 x 4.8 cm = volume: 149 mL. Mildly echogenic renal parenchyma, normal thickness (10 mm). No hydronephrosis. No renal mass. Bladder: Nearly collapsed and grossly normal. Other: None. IMPRESSION: 1. No hydronephrosis. 2. Normal size mildly echogenic kidneys, compatible with reported history of acute nonspecific renal parenchymal disease. Electronically Signed   By: Ilona Sorrel M.D.   On: 10/23/2019 09:08   DG Chest Portable 1 View  Result Date: 10/22/2019 CLINICAL DATA:  Status post fall 10/20/2019. Weakness and bradycardia today. EXAM: PORTABLE CHEST 1 VIEW COMPARISON:  Single-view of the chest 10/06/2019. PA and lateral chest 06/23/2019. FINDINGS: There is cardiomegaly without edema. No pneumothorax or pleural fluid. No acute or focal bony abnormality. IMPRESSION: Cardiomegaly without acute disease. Electronically Signed   By: Inge Rise M.D.   On: 10/22/2019 11:35   ECHOCARDIOGRAM COMPLETE  Result Date: 10/23/2019   ECHOCARDIOGRAM REPORT   Patient Name:   Diane Mullins Date of Exam: 10/23/2019 Medical Rec #:  [id number]     Height:       60.0 in Accession #:    [id number]    Weight:       200.0 lb Date of Birth:   1933/05/05      BSA:          1.87 m Patient Age:    86 years      BP:           104/36 mmHg Patient Gender: F             HR:           42 bpm. Exam Location:  [facility] Procedure: 2D Echo Indications:     ATRIAL FIBRILLATION 427.31/I48.91  History:         Patient has prior history of Echocardiogram examinations, most                  recent 06/17/2019. TIA, Arrythmias:Atrial Flutter,                  Signs/Symptoms:Shortness of Breath; Risk Factors:Dyslipidemia  and Diabetes.  Sonographer:     Avanell Shackleton Referring Phys:  [id number] Arvil Chaco Diagnosing Phys: Ida Rogue MD  Sonographer Comments: Technically difficult study due to poor echo windows. Image acquisition challenging due to patient body habitus. PT WAS UNABLE TO TURN ON HER LEFT SIDE IMPRESSIONS  1. Left ventricular ejection fraction, by visual estimation, is 55 to 60%. The left ventricle has normal function. There is no left ventricular hypertrophy.  2. The left ventricle has no regional wall motion abnormalities.  3. Global right ventricle has normal systolic function.The right ventricular size is mildly enlarged. No increase in right ventricular wall thickness.  4. Left atrial size was severely dilated.  5. Moderately elevated pulmonary artery systolic pressure.  6. The tricuspid regurgitant velocity is 3.45 m/s, and with an assumed right atrial pressure of 10 mmHg, the estimated right ventricular systolic pressure is moderately elevated at 57.7 mmHg.  7. Bradycardia noted, rate 41 bpm FINDINGS  Left Ventricle: Left ventricular ejection fraction, by visual estimation, is 55 to 60%. The left ventricle has normal function. The left ventricle has no regional wall motion abnormalities. There is no left ventricular hypertrophy. Normal left atrial pressure. Right Ventricle: The right ventricular size is mildly enlarged. No increase in right ventricular wall thickness. Global RV systolic function is has normal systolic function. The  tricuspid regurgitant velocity is 3.45 m/s, and with an assumed right atrial  pressure of 10 mmHg, the estimated right ventricular systolic pressure is moderately elevated at 57.7 mmHg. Left Atrium: Left atrial size was severely dilated. Right Atrium: Right atrial size was normal in size Pericardium: There is no evidence of pericardial effusion. Mitral Valve: The mitral valve is normal in structure. Mild mitral valve regurgitation. No evidence of mitral valve stenosis by observation. Tricuspid Valve: The tricuspid valve is normal in structure. Tricuspid valve regurgitation is mild-moderate. Aortic Valve: The aortic valve was not well visualized. Aortic valve regurgitation is not visualized. The aortic valve is structurally normal, with no evidence of sclerosis or stenosis. Pulmonic Valve: The pulmonic valve was normal in structure. Pulmonic valve regurgitation is trivial. Pulmonic regurgitation is trivial. Aorta: The aortic root, ascending aorta and aortic arch are all structurally normal, with no evidence of dilitation or obstruction. Venous: The inferior vena cava is normal in size with greater than 50% respiratory variability, suggesting right atrial pressure of 3 mmHg. IAS/Shunts: No atrial level shunt detected by color flow Doppler. There is no evidence of a patent foramen ovale. No ventricular septal defect is seen or detected. There is no evidence of an atrial septal defect.  LEFT VENTRICLE PLAX 2D LVIDd:         4.07 cm LVIDs:         2.15 cm LV PW:         1.33 cm LV IVS:        1.17 cm LVOT diam:     1.90 cm LV SV:         58 ml LV SV Index:   28.68 LVOT Area:     2.84 cm  LEFT ATRIUM              Index       RIGHT ATRIUM           Index LA diam:        3.60 cm  1.93 cm/m  RA Area:     23.50 cm LA Vol (A2C):   119.0 ml 63.75 ml/m RA Volume:   81.70  ml  43.77 ml/m LA Vol (A4C):   96.0 ml  51.43 ml/m LA Biplane Vol: 111.0 ml 59.47 ml/m   AORTA Ao Root diam: 3.40 cm MR Peak grad: 78.1 mmHg   TRICUSPID  VALVE MR Mean grad: 51.0 mmHg   TR Peak grad:   47.7 mmHg MR Vmax:      442.00 cm/s TR Vmax:        369.00 cm/s MR Vmean:     336.0 cm/s                           SHUNTS                           Systemic Diam: 1.90 cm  Ida Rogue MD Electronically signed by Ida Rogue MD Signature Date/Time: 10/23/2019/7:19:31 PM    Final      Medications:   . DOPamine 5 mcg/kg/min (10/24/19 1118)   . Chlorhexidine Gluconate Cloth  6 each Topical Daily  . heparin  5,000 Units Subcutaneous Q8H  . levothyroxine  150 mcg Oral Q0600  . mirtazapine  7.5 mg Oral QHS  . pantoprazole  40 mg Oral Daily  . simvastatin  20 mg Oral QHS   acetaminophen, polyethylene glycol  Assessment/ Plan:  [name] is a 86 y.o. white female with diabetes mellitus type II insulin dependent, hypertension, hypothyroidism, hyperlipidemia, BPPV, diastolic congestive heart failure, pulmonary hypertension, CVA/TIA, atrial fibrillation who was admitted to [facility] on 10/22/2019 for Bradycardia   1. Acute renal failure with hyperkalemia on chronic kidney disease stage IIIB: baseline creatinine of 1.18, GFR of 43 on 08/06/2019.  Chronic kidney disease is consistent with diabetic nephropathy Acute renal failure secondary to poor perfusion from bradycardia Discussed dialysis with patient.  Low threshold for dialysis Discontinued potassium chloride   2. Bradycardia: irregular. Washing out carvedilol and amiodarone - Dopamine gtt - Appreciate Cardiology input.   3. Hypertension and chronic congestive heart failure. Not in acute exacerbation Holding furosemide.  Holding IV fluids.   4. Diabetes mellitus type II with chronic kidney disease - holding metformin   LOS: 2 Lisha Vitale 2/5/202111:25 AM

## 2019-10-24 NOTE — Progress Notes (Signed)
PROGRESS NOTE    Diane Mullins  OEU:[id number] DOB: 08/11/1933 DOA: 10/22/2019 PCP: [name], MD    Assessment & Plan:   Principal Problem:   Bradycardia Active Problems:   Hypertension   Diabetes ([place])   (HFpEF) heart failure with preserved ejection fraction [facility])   Atrial flutter [facility])   Fall    Diane Mullins is an 86 y.o. female with past medical history significant for atrial fibrillation/flutter, s/p TEE/DCCV (2/4/2020and again November 26, 1538, diastolic heart failure, pulmonary hypertension, diabetes type 2, hypertension, prior stroke/TIA, GI bleed March 2020 , recent fall October 13, 2019 gait instability presenting to the hospital with weakness, bradycardia.   BRADYCARDIA In the setting of hyperkalemia, acute renal failure, also on beta-blocker with amiodarone at home. Beta-blocker amiodarone held since admission --needs 2-1/2-day washout --No indication for pacing at this time.  Blood pressure borderline low --start dopamine infusion @5   AKI likely ATN from hypoperfusion  --Minimum urine output since presentation. --Nephrology consulted --Insert Foley cath for close monitoring of urine output --Hold home lasix --d/c MIVF  Hypoglycemia DM2 on insulin --Noted multiple low BG readings.  A1c from sept 2020 and Jan 2021 both wnl.  Pt is on Levemir 10u daily and Metformin at home. --BG now stable after holding insulin and home metformin.  HYPERKALEMIA, improved --s/p Kayexalate.  HYPOMAGENSEMIA Magnesium is quite low at 1.2,  replete and recheck  Hx of AFLUTTER/FIB Holding carvedilol and amiodarone Patient on telemetry Monitor for pauses  HTN Hold carvedilol given significant bradycardia Hold home lasix 2/2 AKI  ANXIETY AND DEPRESSION Continue mirtazepine    DVT prophylaxis: Heparin SQ Code Status: DNR  Family Communication: not today Disposition Plan: undetermined at this time   Subjective and Interval History:  Pt reported feeling  better, had some cough with mucusy phlegm.  Nursing noted pt choking on her foods.    No urine output since admission.   Objective: Vitals:   10/24/19 1414 10/24/19 1603 10/24/19 1617 10/24/19 1740  BP: (!) 132/48 109/69 (!) 119/45 (!) 139/53  Pulse: 68 62 (!) 50 (!) 102  Resp:  18    Temp:  98.7 F (37.1 C)    TempSrc:  Oral    SpO2: 92% 98% 95% 92%  Weight:      Height:        Intake/Output Summary (Last 24 hours) at 10/24/2019 1744 Last data filed at 10/24/2019 1325 Gross per 24 hour  Intake 1575.02 ml  Output 23 ml  Net 1552.02 ml   Filed Weights   10/22/19 1121  Weight: 90.7 kg    Examination:   Constitutional: NAD, AAOx3, lethargic, but clear headed HEENT: conjunctivae and lids normal, EOMI, bump on the top of the scalp, extensive bruising around her eyes. CV: RRR bradycardic. Distal pulses +2.  No cyanosis.   RESP: CTA B/L, normal respiratory effort, on 2L GI: +BS, NTND Extremities: No effusions, edema, or tenderness in BLE SKIN: warm, dry and intact Neuro: II - XII grossly intact.  Sensation intact Psych: Flat mood and affect.  Appropriate judgement and reason   Data Reviewed: I have personally reviewed following labs and imaging studies  CBC: Recent Labs  Lab 10/22/19 1119 10/23/19 0444 10/24/19 0609  WBC 11.2* 7.3 6.7  NEUTROABS 7.6  --   --   HGB 11.5* 10.3* 10.0*  HCT 36.8 33.4* 31.6*  MCV 92.9 94.4 91.9  PLT 244 201 086   Basic Metabolic Panel: Recent Labs  Lab 10/22/19 1119 10/22/19  1756 10/22/19 2211 10/23/19 0444 10/24/19 0609  NA 138  --  139 139 136  K 6.6* 6.0* 6.4* 5.2* 5.5*  CL 100  --  100 99 92*  CO2 25  --  21* 27 29  GLUCOSE 116*  --  111* 80 120*  BUN 26*  --  29* 32* 38*  CREATININE 3.06*  --  3.86* 4.01* 5.18*  CALCIUM 8.2*  --  8.0* 7.9* 7.6*  MG 1.2*  --  1.6* 2.1 1.9   GFR: Estimated Creatinine Clearance: 8 mL/min (A) (by C-G formula based on SCr of 5.18 mg/dL (H)). Liver Function Tests: Recent Labs  Lab  10/23/19 0444  AST 36  ALT 28  ALKPHOS 176*  BILITOT 0.5  PROT 5.6*  ALBUMIN 3.0*  2.7*   No results for input(s): LIPASE, AMYLASE in the last 168 hours. No results for input(s): AMMONIA in the last 168 hours. Coagulation Profile: No results for input(s): INR, PROTIME in the last 168 hours. Cardiac Enzymes: No results for input(s): CKTOTAL, CKMB, CKMBINDEX, TROPONINI in the last 168 hours. BNP (last 3 results) No results for input(s): PROBNP in the last 8760 hours. HbA1C: No results for input(s): HGBA1C in the last 72 hours. CBG: Recent Labs  Lab 10/23/19 0804 10/23/19 1708 10/23/19 2046 10/24/19 0139 10/24/19 0833  GLUCAP 79 113* 139* 127* 137*   Lipid Profile: No results for input(s): CHOL, HDL, LDLCALC, TRIG, CHOLHDL, LDLDIRECT in the last 72 hours. Thyroid Function Tests: Recent Labs    10/22/19 1756  TSH 2.881   Anemia Panel: No results for input(s): VITAMINB12, FOLATE, FERRITIN, TIBC, IRON, RETICCTPCT in the last 72 hours. Sepsis Labs: No results for input(s): PROCALCITON, LATICACIDVEN in the last 168 hours.  Recent Results (from the past 240 hour(s))  SARS CORONAVIRUS 2 (TAT 6-24 HRS) Nasopharyngeal Nasopharyngeal Swab     Status: None   Collection Time: 10/22/19  1:33 PM   Specimen: Nasopharyngeal Swab  Result Value Ref Range Status   SARS Coronavirus 2 NEGATIVE NEGATIVE Final    Comment: (NOTE) SARS-CoV-2 target nucleic acids are NOT DETECTED. The SARS-CoV-2 RNA is generally detectable in upper and lower respiratory specimens during the acute phase of infection. Negative results do not preclude SARS-CoV-2 infection, do not rule out co-infections with other pathogens, and should not be used as the sole basis for treatment or other patient management decisions. Negative results must be combined with clinical observations, patient history, and epidemiological information. The expected result is Negative. Fact Sheet for Patients:  SugarRoll.be Fact Sheet for Healthcare Providers: https://www.woods-mathews.com/ This test is not yet approved or cleared by the [place] FDA and  has been authorized for detection and/or diagnosis of SARS-CoV-2 by FDA under an Emergency Use Authorization (EUA). This EUA will remain  in effect (meaning this test can be used) for the duration of the COVID-19 declaration under Section 56 4(b)(1) of the Act, 21 U.S.C. section 360bbb-3(b)(1), unless the authorization is terminated or revoked sooner. Performed at [facility], [place] [address], [place], [place] [address]       Radiology Studies: US RENAL  Result Date: 10/23/2019 CLINICAL DATA:  Inpatient.  Acute kidney injury. EXAM: RENAL / URINARY TRACT ULTRASOUND COMPLETE COMPARISON:  12/11/2018 CT abdomen/pelvis FINDINGS: Right Kidney: Renal measurements: 11.4 x 5.4 x 5.2 cm = volume: 168 mL. Mildly echogenic renal parenchyma, normal thickness (10 mm). No hydronephrosis. No renal mass. Left Kidney: Renal measurements: 10.8 x 5.4 x 4.8 cm = volume: 149 mL. Mildly  echogenic renal parenchyma, normal thickness (10 mm). No hydronephrosis. No renal mass. Bladder: Nearly collapsed and grossly normal. Other: None. IMPRESSION: 1. No hydronephrosis. 2. Normal size mildly echogenic kidneys, compatible with reported history of acute nonspecific renal parenchymal disease. Electronically Signed   By: Ilona Sorrel M.D.   On: 10/23/2019 09:08   ECHOCARDIOGRAM COMPLETE  Result Date: 10/23/2019   ECHOCARDIOGRAM REPORT   Patient Name:   Diane Mullins Date of Exam: 10/23/2019 Medical Rec #:  [id number]     Height:       60.0 in Accession #:    [id number]    Weight:       200.0 lb Date of Birth:  09/05/1933      BSA:          1.87 m Patient Age:    74 years      BP:           104/36 mmHg Patient Gender: F             HR:           42 bpm. Exam Location:  [facility] Procedure: 2D Echo Indications:     ATRIAL FIBRILLATION  427.31/I48.91  History:         Patient has prior history of Echocardiogram examinations, most                  recent 06/17/2019. TIA, Arrythmias:Atrial Flutter,                  Signs/Symptoms:Shortness of Breath; Risk Factors:Dyslipidemia                  and Diabetes.  Sonographer:     Avanell Shackleton Referring Phys:  [id number] Arvil Chaco Diagnosing Phys: Ida Rogue MD  Sonographer Comments: Technically difficult study due to poor echo windows. Image acquisition challenging due to patient body habitus. PT WAS UNABLE TO TURN ON HER LEFT SIDE IMPRESSIONS  1. Left ventricular ejection fraction, by visual estimation, is 55 to 60%. The left ventricle has normal function. There is no left ventricular hypertrophy.  2. The left ventricle has no regional wall motion abnormalities.  3. Global right ventricle has normal systolic function.The right ventricular size is mildly enlarged. No increase in right ventricular wall thickness.  4. Left atrial size was severely dilated.  5. Moderately elevated pulmonary artery systolic pressure.  6. The tricuspid regurgitant velocity is 3.45 m/s, and with an assumed right atrial pressure of 10 mmHg, the estimated right ventricular systolic pressure is moderately elevated at 57.7 mmHg.  7. Bradycardia noted, rate 41 bpm FINDINGS  Left Ventricle: Left ventricular ejection fraction, by visual estimation, is 55 to 60%. The left ventricle has normal function. The left ventricle has no regional wall motion abnormalities. There is no left ventricular hypertrophy. Normal left atrial pressure. Right Ventricle: The right ventricular size is mildly enlarged. No increase in right ventricular wall thickness. Global RV systolic function is has normal systolic function. The tricuspid regurgitant velocity is 3.45 m/s, and with an assumed right atrial  pressure of 10 mmHg, the estimated right ventricular systolic pressure is moderately elevated at 57.7 mmHg. Left Atrium: Left atrial size was  severely dilated. Right Atrium: Right atrial size was normal in size Pericardium: There is no evidence of pericardial effusion. Mitral Valve: The mitral valve is normal in structure. Mild mitral valve regurgitation. No evidence of mitral valve stenosis by observation. Tricuspid Valve: The tricuspid valve is normal in structure. Tricuspid  valve regurgitation is mild-moderate. Aortic Valve: The aortic valve was not well visualized. Aortic valve regurgitation is not visualized. The aortic valve is structurally normal, with no evidence of sclerosis or stenosis. Pulmonic Valve: The pulmonic valve was normal in structure. Pulmonic valve regurgitation is trivial. Pulmonic regurgitation is trivial. Aorta: The aortic root, ascending aorta and aortic arch are all structurally normal, with no evidence of dilitation or obstruction. Venous: The inferior vena cava is normal in size with greater than 50% respiratory variability, suggesting right atrial pressure of 3 mmHg. IAS/Shunts: No atrial level shunt detected by color flow Doppler. There is no evidence of a patent foramen ovale. No ventricular septal defect is seen or detected. There is no evidence of an atrial septal defect.  LEFT VENTRICLE PLAX 2D LVIDd:         4.07 cm LVIDs:         2.15 cm LV PW:         1.33 cm LV IVS:        1.17 cm LVOT diam:     1.90 cm LV SV:         58 ml LV SV Index:   28.68 LVOT Area:     2.84 cm  LEFT ATRIUM              Index       RIGHT ATRIUM           Index LA diam:        3.60 cm  1.93 cm/m  RA Area:     23.50 cm LA Vol (A2C):   119.0 ml 63.75 ml/m RA Volume:   81.70 ml  43.77 ml/m LA Vol (A4C):   96.0 ml  51.43 ml/m LA Biplane Vol: 111.0 ml 59.47 ml/m   AORTA Ao Root diam: 3.40 cm MR Peak grad: 78.1 mmHg   TRICUSPID VALVE MR Mean grad: 51.0 mmHg   TR Peak grad:   47.7 mmHg MR Vmax:      442.00 cm/s TR Vmax:        369.00 cm/s MR Vmean:     336.0 cm/s                           SHUNTS                           Systemic Diam: 1.90 cm   Ida Rogue MD Electronically signed by Ida Rogue MD Signature Date/Time: 10/23/2019/7:19:31 PM    Final      Scheduled Meds: . Chlorhexidine Gluconate Cloth  6 each Topical Daily  . heparin  5,000 Units Subcutaneous Q8H  . levothyroxine  150 mcg Oral Q0600  . mirtazapine  7.5 mg Oral QHS  . pantoprazole  40 mg Oral Daily  . simvastatin  20 mg Oral QHS   Continuous Infusions: . DOPamine 5 mcg/kg/min (10/24/19 1118)     LOS: 2 days     Enzo Bi, MD [facility] If 7PM-7AM, please contact night-coverage 10/24/2019, 5:44 PM

## 2019-10-24 NOTE — Telephone Encounter (Signed)
Pt daughter dropped off FMLA paperwork to be filled out  Placed in Dr. Ellen Henri color folder upfront  Please call [phone] when completed

## 2019-10-24 NOTE — Progress Notes (Signed)
Progress Note  Patient Name: Diane Mullins Date of Encounter: 10/24/2019  Primary Cardiologist: Rockey Situ  Subjective   No dizziness, presyncope or syncope. No chest pain or dyspnea. Remains in junctional rhythm this morning with rates in the 40s bpm with brief episodes of sinus. Last dose of Coreg and amiodarone possibly 2/3, though patient is not certain. Hyperkalemia persists. Renal function worsening.    Inpatient Medications    Scheduled Meds: . Chlorhexidine Gluconate Cloth  6 each Topical Daily  . heparin  5,000 Units Subcutaneous Q8H  . levothyroxine  150 mcg Oral Q0600  . mirtazapine  7.5 mg Oral QHS  . pantoprazole  40 mg Oral Daily  . simvastatin  20 mg Oral QHS   Continuous Infusions: . sodium bicarbonate 150 mEq in dextrose 5% 1000 mL 75 mL/hr at 10/24/19 0600   PRN Meds: acetaminophen, polyethylene glycol   Vital Signs    Vitals:   10/23/19 1557 10/23/19 2023 10/24/19 0132 10/24/19 0451  BP: (!) 104/36 (!) 107/39 (!) 116/49 (!) 115/41  Pulse: (!) 40 (!) 41 (!) 43 (!) 40  Resp: 18 20 20 20   Temp: 98.4 F (36.9 C) (!) 97.5 F (36.4 C) 98.7 F (37.1 C) 98 F (36.7 C)  TempSrc: Oral Oral Oral Oral  SpO2: 99% 99% 99% 100%  Weight:      Height:        Intake/Output Summary (Last 24 hours) at 10/24/2019 0813 Last data filed at 10/24/2019 0600 Gross per 24 hour  Intake 1913.97 ml  Output 23 ml  Net 1890.97 ml   Filed Weights   10/22/19 1121  Weight: 90.7 kg    Telemetry    Junctional rhythm with rates in the 40s bpm with occasional episodes of sinus rhythm - Personally Reviewed  ECG    No new tracings - Personally Reviewed  Physical Exam   GEN: No acute distress. Contusion along the left forehead and the bilateral eyes Neck: No JVD. Cardiac: Bradycardic, no murmurs, rubs, or gallops.  Respiratory: Clear to auscultation bilaterally.  GI: Soft, nontender, non-distended.   MS: No edema; No deformity. Neuro:  Alert and oriented x 3; Nonfocal.    Psych: Normal affect.  Labs    Chemistry Recent Labs  Lab 10/22/19 2211 10/23/19 0444 10/24/19 0609  NA 139 139 136  K 6.4* 5.2* 5.5*  CL 100 99 92*  CO2 21* 27 29  GLUCOSE 111* 80 120*  BUN 29* 32* 38*  CREATININE 3.86* 4.01* 5.18*  CALCIUM 8.0* 7.9* 7.6*  PROT  --  5.6*  --   ALBUMIN  --  3.0*  2.7*  --   AST  --  36  --   ALT  --  28  --   ALKPHOS  --  176*  --   BILITOT  --  0.5  --   GFRNONAA 10* 10* 7*  GFRAA 12* 11* 8*  ANIONGAP 18* 13 15     Hematology Recent Labs  Lab 10/22/19 1119 10/23/19 0444 10/24/19 0609  WBC 11.2* 7.3 6.7  RBC 3.96 3.54* 3.44*  HGB 11.5* 10.3* 10.0*  HCT 36.8 33.4* 31.6*  MCV 92.9 94.4 91.9  MCH 29.0 29.1 29.1  MCHC 31.3 30.8 31.6  RDW 15.4 15.4 15.5  PLT 244 201 207    Cardiac EnzymesNo results for input(s): TROPONINI in the last 168 hours. No results for input(s): TROPIPOC in the last 168 hours.   BNPNo results for input(s): BNP, PROBNP in the  last 168 hours.   DDimer No results for input(s): DDIMER in the last 168 hours.   Radiology    US RENAL  Result Date: 10/23/2019 IMPRESSION: 1. No hydronephrosis. 2. Normal size mildly echogenic kidneys, compatible with reported history of acute nonspecific renal parenchymal disease. Electronically Signed   By: Ilona Sorrel M.D.   On: 10/23/2019 09:08   DG Chest Portable 1 View  Result Date: 10/22/2019 IMPRESSION: Cardiomegaly without acute disease. Electronically Signed   By: Inge Rise M.D.   On: 10/22/2019 11:35     Cardiac Studies   2D Echo 10/23/2019: 1. Left ventricular ejection fraction, by visual estimation, is 55 to  60%. The left ventricle has normal function. There is no left ventricular  hypertrophy.  2. The left ventricle has no regional wall motion abnormalities.  3. Global right ventricle has normal systolic function.The right  ventricular size is mildly enlarged. No increase in right ventricular wall  thickness.  4. Left atrial size was severely  dilated.  5. Moderately elevated pulmonary artery systolic pressure.  6. The tricuspid regurgitant velocity is 3.45 m/s, and with an assumed  right atrial pressure of 10 mmHg, the estimated right ventricular systolic  pressure is moderately elevated at 57.7 mmHg.  7. Bradycardia noted, rate 41 bpm   Patient Profile     86 y.o. female with history of Afib/flutter diagnosed in 10/2018 s/p TEE/DCCV in 10/2018 and again in 11/2018 previously on Eliquis though stopped secondary to GI bleed in 11/2018, HFpEF/pulmonary hypertension, atrial tachycardia, DM2, HTN, HLD, prior stroke, vertigo, and recent fall in 09/2019 with gait instability who we are seeing for junctional rhythm in the setting of hyperkalemia with ARF and Coreg/amiodarone usage.   Assessment & Plan    1. Junctional rhythm: -She remains junctional with brief episodes of sinus -Likely multifactorial including Coreg, amiodarone, and hyperkalemia with last dose of these medications possibly 2/3, though she is not certain -The clearance of her medications is slowed by her ARF with a SCr now > 5 -Hyperkalemia persists -She denies any dizziness, presyncope, or syncope -Continue to let medications wash out and recommend correction of hyperkalemia  -If her junctional rhythm persists following medication wash out and correction of her electrolyte abnormality, she may require PPM -BP in the low 295A systolic  -May need Dopamine to increase renal perfusion  -TSH normal this admission   2. CAD involving the native coronary arteries without angina/elevated HS-Tn: -Mildly elevated high sensitivity troponin of 64 with a delta of 58 -Low risk MPI in 2019 -No angina -Previously on Eliquis in place of ASA -Statin -No plans for inpatient ischemic evaluation at this time  3. Afib/flutter: -Currently junctional as above -Coreg and amiodarone held as above -No longer on Eliquis given recent fall with head trauma in 09/2019 -CHADS2VASc at least 8  (CHF, HTN, age x 2, stroke x 2, vascular disease, sex category)  4. HFpEF/pulmonary hypertension: -Lasix held with ARF -Volume per nephrology   5. ARF on CKD stage III with hyperkalemia: -Renal function worsening -May need Dopamine as above, will discuss with MD -Nephrology on board -Recommend correction of hyperkalemia  -May need dialysis   For questions or updates, please contact [facility] Please consult [email or web address] for contact info under Cardiology/STEMI.    Signed, Christell Faith, PA-C [place] Pager: [phone] 10/24/2019, 8:13 AM

## 2019-10-24 NOTE — Care Management Important Message (Signed)
Important Message  Patient Details  Name: Diane Mullins MRN: [id number] Date of Birth: [birth year]   Medicare Important Message Given:  Yes     Dannette Barbara 10/24/2019, 11:06 AM

## 2019-10-24 NOTE — Progress Notes (Signed)
E. Ouma notified of increased work of breathing and increase cough and oral secretions, requiring oral suction with yankaur. She was in to examine pt. Lung exam reveal coarse crackles and rhonci throughout. RR is 20 with abdominal breathing. Pt remains alert and oriented x 4, although dyspneic with talking. Denies pain. Foley cath has minimal uop; dopamine continues at 5 mcg/kg/min via right AC PIV. Blood return is present. Right hand PIV SL also has blood return. HR currently in 50's-70's. VSS.  Pt's daughter called for update tonight; she would like to speak with MD regarding prognosis as soon as feasibly possible so as to have family from out of state come if necessary. Daughter informed that kidney function remains severely impaired, although measures are being taken to improve this. E. Ouma aware of update to family and will talk with family as necessary tonight.

## 2019-10-24 NOTE — Progress Notes (Signed)
SLP Cancellation Note  Patient Details Name: WHITTLEY CARANDANG MRN: [id number] DOB: 1933/08/25   Cancelled treatment:       Reason Eval/Treat Not Completed: Patient at procedure or test/unavailable  Will attempt later today or tomorrow.    Leroy Sea, MS/CCC- SLP  Lou Miner 10/24/2019, 3:07 PM

## 2019-10-25 ENCOUNTER — Inpatient Hospital Stay: Payer: PPO

## 2019-10-25 DIAGNOSIS — N189 Chronic kidney disease, unspecified: Secondary | ICD-10-CM

## 2019-10-25 DIAGNOSIS — I480 Paroxysmal atrial fibrillation: Secondary | ICD-10-CM

## 2019-10-25 LAB — CBC
HCT: 34.5 % — ABNORMAL LOW (ref 36.0–46.0)
Hemoglobin: 11.4 g/dL — ABNORMAL LOW (ref 12.0–15.0)
MCH: 29.4 pg (ref 26.0–34.0)
MCHC: 33 g/dL (ref 30.0–36.0)
MCV: 88.9 fL (ref 80.0–100.0)
Platelets: 248 10*3/uL (ref 150–400)
RBC: 3.88 MIL/uL (ref 3.87–5.11)
RDW: 14.7 % (ref 11.5–15.5)
WBC: 9.3 10*3/uL (ref 4.0–10.5)
nRBC: 0 % (ref 0.0–0.2)

## 2019-10-25 LAB — URINALYSIS, ROUTINE W REFLEX MICROSCOPIC
Bilirubin Urine: NEGATIVE
Glucose, UA: NEGATIVE mg/dL
Ketones, ur: NEGATIVE mg/dL
Nitrite: NEGATIVE
Protein, ur: NEGATIVE mg/dL
Specific Gravity, Urine: 1.008 (ref 1.005–1.030)
pH: 6 (ref 5.0–8.0)

## 2019-10-25 LAB — PROTEIN / CREATININE RATIO, URINE
Creatinine, Urine: 31 mg/dL
Protein Creatinine Ratio: 1.58 mg/mg{Cre} — ABNORMAL HIGH (ref 0.00–0.15)
Total Protein, Urine: 49 mg/dL

## 2019-10-25 LAB — BASIC METABOLIC PANEL
Anion gap: 17 — ABNORMAL HIGH (ref 5–15)
BUN: 45 mg/dL — ABNORMAL HIGH (ref 8–23)
CO2: 28 mmol/L (ref 22–32)
Calcium: 7.8 mg/dL — ABNORMAL LOW (ref 8.9–10.3)
Chloride: 89 mmol/L — ABNORMAL LOW (ref 98–111)
Creatinine, Ser: 5.76 mg/dL — ABNORMAL HIGH (ref 0.44–1.00)
GFR calc Af Amer: 7 mL/min — ABNORMAL LOW (ref >60)
GFR calc non Af Amer: 6 mL/min — ABNORMAL LOW (ref >60)
Glucose, Bld: 164 mg/dL — ABNORMAL HIGH (ref 70–99)
Potassium: 5.8 mmol/L — ABNORMAL HIGH (ref 3.5–5.1)
Sodium: 134 mmol/L — ABNORMAL LOW (ref 135–145)

## 2019-10-25 LAB — GLUCOSE, CAPILLARY
Glucose-Capillary: 177 mg/dL — ABNORMAL HIGH (ref 70–99)
Glucose-Capillary: 193 mg/dL — ABNORMAL HIGH (ref 70–99)

## 2019-10-25 LAB — MAGNESIUM: Magnesium: 1.9 mg/dL (ref 1.7–2.4)

## 2019-10-25 MED ORDER — METOLAZONE 5 MG PO TABS
5.0000 mg | ORAL_TABLET | Freq: Once | ORAL | Status: AC
  Administered 2019-10-25: 5 mg via ORAL
  Filled 2019-10-25: qty 1

## 2019-10-25 MED ORDER — SODIUM CHLORIDE 0.9% FLUSH
10.0000 mL | Freq: Two times a day (BID) | INTRAVENOUS | Status: DC
  Administered 2019-10-25 – 2019-10-29 (×7): 10 mL via INTRAVENOUS

## 2019-10-25 MED ORDER — IPRATROPIUM-ALBUTEROL 0.5-2.5 (3) MG/3ML IN SOLN
3.0000 mL | Freq: Four times a day (QID) | RESPIRATORY_TRACT | Status: DC | PRN
  Administered 2019-10-25 (×2): 3 mL via RESPIRATORY_TRACT
  Filled 2019-10-25 (×2): qty 3

## 2019-10-25 MED ORDER — SODIUM ZIRCONIUM CYCLOSILICATE 10 G PO PACK
10.0000 g | PACK | Freq: Every day | ORAL | Status: DC
  Administered 2019-10-25 – 2019-10-26 (×2): 10 g via ORAL
  Filled 2019-10-25 (×4): qty 1

## 2019-10-25 MED ORDER — FUROSEMIDE 10 MG/ML IJ SOLN
80.0000 mg | Freq: Once | INTRAMUSCULAR | Status: AC
  Administered 2019-10-25: 80 mg via INTRAVENOUS
  Filled 2019-10-25: qty 8

## 2019-10-25 NOTE — Progress Notes (Signed)
E. Ouma updated regarding CXR results are available; will await further orders. Pt resting at present. RR 20. Sat 97

## 2019-10-25 NOTE — Progress Notes (Signed)
[place], [place] 10/25/19  Subjective:   Hospital day # 3  Overall, patient is doing fair Appetite is poor Patient's grand-daughter Ria Comment, RN is in the room with her Patient denies any acute shortness of breath Serum creatinine continues to worsen Renal: 02/05 0701 - 02/06 0700 In: 271.9 [P.O.:120; I.V.:101.9] Out: 158 [Urine:158] Lab Results  Component Value Date   CREATININE 5.76 (H) 10/25/2019   CREATININE 5.18 (H) 10/24/2019   CREATININE 4.01 (H) 10/23/2019     Objective:  Vital signs in last 24 hours:  Temp:  [97.6 F (36.4 C)-98.7 F (37.1 C)] 97.6 F (36.4 C) (02/06 1224) Pulse Rate:  [50-102] 79 (02/06 1224) Resp:  [16-32] 16 (02/06 1224) BP: (109-158)/(39-84) 141/65 (02/06 1224) SpO2:  [86 %-98 %] 98 % (02/06 1224)  Weight change:  Filed Weights   10/22/19 1121  Weight: 90.7 kg    Intake/Output:    Intake/Output Summary (Last 24 hours) at 10/25/2019 1455 Last data filed at 10/25/2019 0200 Gross per 24 hour  Intake 271.88 ml  Output 158 ml  Net 113.88 ml     Physical Exam: General:  Frail, elderly woman, laying in the bed  HEENT  periorbital ecchymosis bilaterally  Pulm/lungs  normal breathing effort, decreased breath sounds at bases  CVS/Heart  irregular rhythm  Abdomen:   Soft  Extremities:  Trace edema  Neurologic:  Alert, able to answer few simple questions  Skin:  Warm    Basic Metabolic Panel:  Recent Labs  Lab 10/22/19 1119 10/22/19 1119 10/22/19 1756 10/22/19 2211 10/22/19 2211 10/23/19 0444 10/24/19 0609 10/25/19 0425  NA 138  --   --  139  --  139 136 134*  K 6.6*   < > 6.0* 6.4*  --  5.2* 5.5* 5.8*  CL 100  --   --  100  --  99 92* 89*  CO2 25  --   --  21*  --  27 29 28   GLUCOSE 116*  --   --  111*  --  80 120* 164*  BUN 26*  --   --  29*  --  32* 38* 45*  CREATININE 3.06*  --   --  3.86*  --  4.01* 5.18* 5.76*  CALCIUM 8.2*   < >  --  8.0*   < > 7.9* 7.6* 7.8*  MG 1.2*  --   --  1.6*   --  2.1 1.9 1.9   < > = values in this interval not displayed.     CBC: Recent Labs  Lab 10/22/19 1119 10/23/19 0444 10/24/19 0609 10/25/19 0425  WBC 11.2* 7.3 6.7 9.3  NEUTROABS 7.6  --   --   --   HGB 11.5* 10.3* 10.0* 11.4*  HCT 36.8 33.4* 31.6* 34.5*  MCV 92.9 94.4 91.9 88.9  PLT 244 201 207 248      Lab Results  Component Value Date   HEPBSAG NON REACTIVE 10/23/2019   HEPBSAB NON REACTIVE 10/23/2019   HEPBIGM NON REACTIVE 10/23/2019      Microbiology:  Recent Results (from the past 240 hour(s))  SARS CORONAVIRUS 2 (TAT 6-24 HRS) Nasopharyngeal Nasopharyngeal Swab     Status: None   Collection Time: 10/22/19  1:33 PM   Specimen: Nasopharyngeal Swab  Result Value Ref Range Status   SARS Coronavirus 2 NEGATIVE NEGATIVE Final    Comment: (NOTE) SARS-CoV-2 target nucleic acids are NOT DETECTED. The SARS-CoV-2 RNA is generally detectable in upper and  lower respiratory specimens during the acute phase of infection. Negative results do not preclude SARS-CoV-2 infection, do not rule out co-infections with other pathogens, and should not be used as the sole basis for treatment or other patient management decisions. Negative results must be combined with clinical observations, patient history, and epidemiological information. The expected result is Negative. Fact Sheet for Patients: SugarRoll.be Fact Sheet for Healthcare Providers: https://www.woods-mathews.com/ This test is not yet approved or cleared by the [place] FDA and  has been authorized for detection and/or diagnosis of SARS-CoV-2 by FDA under an Emergency Use Authorization (EUA). This EUA will remain  in effect (meaning this test can be used) for the duration of the COVID-19 declaration under Section 56 4(b)(1) of the Act, 21 U.S.C. section 360bbb-3(b)(1), unless the authorization is terminated or revoked sooner. Performed at [facility], [place]  [address], [place], [place] [address]     Coagulation Studies: No results for input(s): LABPROT, INR in the last 72 hours.  Urinalysis: No results for input(s): COLORURINE, LABSPEC, PHURINE, GLUCOSEU, HGBUR, BILIRUBINUR, KETONESUR, PROTEINUR, UROBILINOGEN, NITRITE, LEUKOCYTESUR in the last 72 hours.  Invalid input(s): APPERANCEUR    Imaging: DG Chest 1 View  Result Date: 10/25/2019 CLINICAL DATA:  Shortness of breath EXAM: CHEST  1 VIEW COMPARISON:  10/22/2019 FINDINGS: Heart size is enlarged. There are worsening interstitial lung markings bilaterally. There is no pneumothorax. There may be small bilateral pleural effusions, right greater than left, new from prior study. There is no acute osseous abnormality. IMPRESSION: Cardiomegaly with findings suggestive of congestive heart failure. A superimposed atypical infectious process cannot be excluded. Electronically Signed   By: Constance Holster M.D.   On: 10/25/2019 02:16   ECHOCARDIOGRAM COMPLETE  Result Date: 10/23/2019   ECHOCARDIOGRAM REPORT   Patient Name:   TARAJI MUNGO Date of Exam: 10/23/2019 Medical Rec #:  [id number]     Height:       60.0 in Accession #:    [id number]    Weight:       200.0 lb Date of Birth:  March 07, 1933      BSA:          1.87 m Patient Age:    68 years      BP:           104/36 mmHg Patient Gender: F             HR:           42 bpm. Exam Location:  [facility] Procedure: 2D Echo Indications:     ATRIAL FIBRILLATION 427.31/I48.91  History:         Patient has prior history of Echocardiogram examinations, most                  recent 06/17/2019. TIA, Arrythmias:Atrial Flutter,                  Signs/Symptoms:Shortness of Breath; Risk Factors:Dyslipidemia                  and Diabetes.  Sonographer:     Avanell Shackleton Referring Phys:  [id number] Arvil Chaco Diagnosing Phys: Ida Rogue MD  Sonographer Comments: Technically difficult study due to poor echo windows. Image acquisition challenging due to patient body habitus. PT WAS  UNABLE TO TURN ON HER LEFT SIDE IMPRESSIONS  1. Left ventricular ejection fraction, by visual estimation, is 55 to 60%. The left ventricle has normal function. There is no left ventricular hypertrophy.  2.  The left ventricle has no regional wall motion abnormalities.  3. Global right ventricle has normal systolic function.The right ventricular size is mildly enlarged. No increase in right ventricular wall thickness.  4. Left atrial size was severely dilated.  5. Moderately elevated pulmonary artery systolic pressure.  6. The tricuspid regurgitant velocity is 3.45 m/s, and with an assumed right atrial pressure of 10 mmHg, the estimated right ventricular systolic pressure is moderately elevated at 57.7 mmHg.  7. Bradycardia noted, rate 41 bpm FINDINGS  Left Ventricle: Left ventricular ejection fraction, by visual estimation, is 55 to 60%. The left ventricle has normal function. The left ventricle has no regional wall motion abnormalities. There is no left ventricular hypertrophy. Normal left atrial pressure. Right Ventricle: The right ventricular size is mildly enlarged. No increase in right ventricular wall thickness. Global RV systolic function is has normal systolic function. The tricuspid regurgitant velocity is 3.45 m/s, and with an assumed right atrial  pressure of 10 mmHg, the estimated right ventricular systolic pressure is moderately elevated at 57.7 mmHg. Left Atrium: Left atrial size was severely dilated. Right Atrium: Right atrial size was normal in size Pericardium: There is no evidence of pericardial effusion. Mitral Valve: The mitral valve is normal in structure. Mild mitral valve regurgitation. No evidence of mitral valve stenosis by observation. Tricuspid Valve: The tricuspid valve is normal in structure. Tricuspid valve regurgitation is mild-moderate. Aortic Valve: The aortic valve was not well visualized. Aortic valve regurgitation is not visualized. The aortic valve is structurally normal, with no  evidence of sclerosis or stenosis. Pulmonic Valve: The pulmonic valve was normal in structure. Pulmonic valve regurgitation is trivial. Pulmonic regurgitation is trivial. Aorta: The aortic root, ascending aorta and aortic arch are all structurally normal, with no evidence of dilitation or obstruction. Venous: The inferior vena cava is normal in size with greater than 50% respiratory variability, suggesting right atrial pressure of 3 mmHg. IAS/Shunts: No atrial level shunt detected by color flow Doppler. There is no evidence of a patent foramen ovale. No ventricular septal defect is seen or detected. There is no evidence of an atrial septal defect.  LEFT VENTRICLE PLAX 2D LVIDd:         4.07 cm LVIDs:         2.15 cm LV PW:         1.33 cm LV IVS:        1.17 cm LVOT diam:     1.90 cm LV SV:         58 ml LV SV Index:   28.68 LVOT Area:     2.84 cm  LEFT ATRIUM              Index       RIGHT ATRIUM           Index LA diam:        3.60 cm  1.93 cm/m  RA Area:     23.50 cm LA Vol (A2C):   119.0 ml 63.75 ml/m RA Volume:   81.70 ml  43.77 ml/m LA Vol (A4C):   96.0 ml  51.43 ml/m LA Biplane Vol: 111.0 ml 59.47 ml/m   AORTA Ao Root diam: 3.40 cm MR Peak grad: 78.1 mmHg   TRICUSPID VALVE MR Mean grad: 51.0 mmHg   TR Peak grad:   47.7 mmHg MR Vmax:      442.00 cm/s TR Vmax:        369.00 cm/s MR Vmean:  336.0 cm/s                           SHUNTS                           Systemic Diam: 1.90 cm  Ida Rogue MD Electronically signed by Ida Rogue MD Signature Date/Time: 10/23/2019/7:19:31 PM    Final      Medications:   . DOPamine 5 mcg/kg/min (10/25/19 0200)   . Chlorhexidine Gluconate Cloth  6 each Topical Daily  . furosemide  80 mg Intravenous Once  . heparin  5,000 Units Subcutaneous Q8H  . levothyroxine  150 mcg Oral Q0600  . mirtazapine  7.5 mg Oral QHS  . pantoprazole  40 mg Oral Daily  . simvastatin  20 mg Oral QHS   acetaminophen, ipratropium-albuterol, polyethylene  glycol  Assessment/ Plan:  86 y.o. female with multiple complex medical problems including diabetes type 2, insulin-dependent, hypertension, hypothyroidism, hyperlipidemia, congestive heart failure, pulmonary hypertension, history of stroke, atrial fibrillation  admitted on 10/22/2019 for Acute kidney failure, unspecified ([place]) [N17.9] Hyperkalemia [E87.5] Bradycardia [R00.1] Atrial fibrillation, unspecified type ([place]) [I48.91]  #.  Acute renal failure on chronic kidney disease stage IIIb Baseline creatinine of 1.18/GFR 43 on August 06, 2019 Underlying CKD likely secondary to diabetic nephropathy AKI likely secondary to cardiorenal syndrome from poor perfusion related to cardiac arrhythmias Discussed overall care, prognosis and hospital course with patient's granddaughter Ria Comment, who is a Therapist, sports at [employer] states that patient would not want aggressive care or dialysis due to her advanced age and comorbidities Patient is already wheelchair-bound.  She is weak and dialysis may be against patient's wishes She will discuss with rest of the family.  For now, conservative management If renal function worsens, consider hospice  #Hyperkalemia Can be managed with Lokelma  #Congestive heart failure with pulmonary hypertension, atrial fibrillation, coronary disease Complex cardiac physiology Managed with IV Lasix and IV dopamine Cardiology team is following Conservative management for now.      LOS: [place] 2/6/20212:55 PM  [place], [place]  Note: This note was prepared with Dragon dictation. Any transcription errors are unintentional

## 2019-10-25 NOTE — Progress Notes (Signed)
E. Diane Mullins notified of increased oxygen need and increased work of breathing. O2 increased to 4 liter [place]. Dopamine continues at 5 mcg/kg/min via right AC. Pt remains alert and oriented, but with increased garbled speech. She was able to swallow small amt water without cough. Encouraged cough and deep breathe to help clear secretions; frequent oral suction of clear thin sputum.

## 2019-10-25 NOTE — Evaluation (Addendum)
Clinical/Bedside Swallow Evaluation Patient Details  Name: Diane Mullins MRN: [id number] Date of Birth: 1933-06-25  Today's Date: 10/25/2019 Time: SLP Start Time (ACUTE ONLY): 1045 SLP Stop Time (ACUTE ONLY): 1135 SLP Time Calculation (min) (ACUTE ONLY): 50 min  Past Medical History:  Past Medical History:  Diagnosis Date  . Atrial fibrillation ([place])   . Benign paroxysmal positional vertigo 07/17/2014  . Chronic diastolic CHF (congestive heart failure) ([place])    a. echo 10/19: EF of 60-65%, no RWMA, Gr2DD, calcified mitral annulus with mild MR, mildly to moderately dilated left atrium, moderate TR, PASP 52 mmHg  . Diabetes mellitus   . H/O: rheumatic fever   . HOH (hard of hearing)   . Hyperlipidemia   . Hypertension   . Pulmonary hypertension ([place])   . Stroke [facility])    TIA Jan. 1st  . Teratoma of left ovary   . Thyroid disease    Past Surgical History:  Past Surgical History:  Procedure Laterality Date  . ABDOMINAL HYSTERECTOMY  1973   menorrhagia  . CARDIOVERSION N/A 10/22/2018   Procedure: CARDIOVERSION;  Surgeon: Nelva Bush, MD;  Location: [facility] ORS;  Service: Cardiovascular;  Laterality: N/A;  . CARDIOVERSION N/A 11/26/2018   Procedure: CARDIOVERSION (CATH LAB);  Surgeon: Minna Merritts, MD;  Location: [facility] ORS;  Service: Cardiovascular;  Laterality: N/A;  . COLONOSCOPY WITH PROPOFOL N/A 12/16/2018   Procedure: COLONOSCOPY WITH PROPOFOL;  Surgeon: Lin Landsman, MD;  Location: [facility] ENDOSCOPY;  Service: Gastroenterology;  Laterality: N/A;  . ENTEROSCOPY N/A 12/16/2018   Procedure: Push ENTEROSCOPY;  Surgeon: Lin Landsman, MD;  Location: [facility] END[facility]COPY;  Service: Gastroenterology;  Laterality: N/A;  . ESOPHAGOGASTRODUODENOSCOPY N/A 06/18/2019   Procedure: ESOPHAGOGASTRODUODENOSCOPY (EGD);  Surgeon: Lin Landsman, MD;  Location: [facility] ENDOSCOPY;  Service: Gastroenterology;  Laterality: N/A;  . ESOPHAGOGASTRODUODENOSCOPY (EGD) WITH PROPOFOL N/A 12/14/2018    Procedure: ESOPHAGOGASTRODUODENOSCOPY (EGD) WITH PROPOFOL;  Surgeon: Lucilla Lame, MD;  Location: [facility] ENDOSCOPY;  Service: Endoscopy;  Laterality: N/A;  . GIVENS CAPSULE STUDY N/A 06/18/2019   Procedure: GIVENS CAPSULE STUDY;  Surgeon: Lin Landsman, MD;  Location: [facility] ENDOSCOPY;  Service: Gastroenterology;  Laterality: N/A;  . HERNIA REPAIR  1994  . ORIF ANKLE FRACTURE Right 10/29/2015   Procedure: OPEN REDUCTION INTERNAL FIXATION (ORIF) ANKLE FRACTURE;  Surgeon: Thornton Park, MD;  Location: [facility] ORS;  Service: Orthopedics;  Laterality: Right;  . TEE WITHOUT CARDIOVERSION N/A 10/22/2018   Procedure: TRANSESOPHAGEAL ECHOCARDIOGRAM (TEE);  Surgeon: Nelva Bush, MD;  Location: [facility] ORS;  Service: Cardiovascular;  Laterality: N/A;  . VAGINAL DELIVERY     x4   HPI:  Pt is a 86 y.o. female with possible history of atrial fibrillation, Hiatal Hernia per EGD in 05/2019, obesity, CAD, CHF, hypertension, and hyperlipidemia who presents to the ED for bradycardia.  Patient had a fall about 1 week ago and states she has been feeling weak since then.  She went to her PCPs office for regular follow-up today, where they found her to be bradycardic and much weaker than usual.  Pt has current worsening kidney and pulmonary function; recent CXR revealed: "Cardiomegaly with findings suggestive of congestive heart failure".  Unsure of pt's Baseline Cognitive status; recent Head CT revealed: "Moderate generalized parenchymal atrophy and chronic small vessel ischemic changes".   Assessment / Plan / Recommendation Clinical Impression  Pt appears to present w/ pharyngeal phase dysphagia w/ overt clinical s/s of aspiration -- coughing noted w/ Cup sip trials of thin and Nectar liquids though  not consistent. Pt's status appears impacted by her declined Cognitive status currently, and overall desire to participate in po intake/trials. Much encouragement was needed for pt to accept the few po trials taken. Pt is also  requiring full feeding support. These issues can increase risk for aspiration of oral intake. Pt was positioned fully upright w/ head forward for the few trials she accepted. Inconsistent mild coughing noted w/ trials of thin liquids -- Granddaughter present stated this "had been going on at home for a little while" (the coughing). No overt s/s of aspiration noted w/ trials of puree. No solids were given at this assessment as pt seemed more agitated when suggested. Oral phase appeared grossly [facility] w/ bolus management, oral control, A-P transfer and oral clearing. OM exam was limitied d/t pt's participation but appeared grossly [facility] w/ no unilateral weakness noted; speech clear. Due to pt's overall weakness and Cognitive presentation currently, recommend a dysphagia level 3 (mech soft) w/ Nectar liquids w/ aspiration precautions; feeding support at meal; Pills in Puree for safer swallowing. ST services will continue to monitor pt's status while admitted for need for objective f/u. Hold any po's if pt exhibits increased s/s of poor tolerance, breathing, or s/s of aspiration; or if pt is too drowsy/not attentive for safe oral intake. The above and POC was discussed w/ Granddaughter present and agreed upon. NSG updated.  SLP Visit Diagnosis: Dysphagia, pharyngeal phase (R13.13)    Aspiration Risk  Moderate aspiration risk;Risk for inadequate nutrition/hydration    Diet Recommendation  Dysphagia level 3 (mech soft w/ gravies to moisten), NECTAR liquids. Aspiration precautions. Feeding support and Supervision w/ oral intake - encouragement w/ oral intake  Medication Administration: Whole meds with puree(vs Crushed in puree if needed for ease of intake)    Other  Recommendations Recommended Consults: (Dietician f/u; Palliative Care f/u for POC ) Oral Care Recommendations: Oral care BID;Oral care before and after PO;Staff/trained caregiver to provide oral care Other Recommendations: Order thickener from  pharmacy;Prohibited food (jello, ice cream, thin soups);Remove water pitcher;Have oral suction available   Follow up Recommendations (TBD)      Frequency and Duration min 3x week  2 weeks       Prognosis Prognosis for Safe Diet Advancement: Fair Barriers to Reach Goals: Cognitive deficits;Time post onset;Severity of deficits;Motivation Barriers/Prognosis Comment: motivation      Swallow Study   General Date of Onset: 10/22/19 HPI: Pt is a 86 y.o. female with possible history of atrial fibrillation, Hiatal Hernia per EGD in 05/2019, obesity, CAD, CHF, hypertension, and hyperlipidemia who presents to the ED for bradycardia.  Patient had a fall about 1 week ago and states she has been feeling weak since then.  She went to her PCPs office for regular follow-up today, where they found her to be bradycardic and much weaker than usual.  Pt has current worsening kidney and pulmonary function; recent CXR revealed: "Cardiomegaly with findings suggestive of congestive heart failure".  Unsure of pt's Baseline Cognitive status; recent Head CT revealed: "Moderate generalized parenchymal atrophy and chronic small vessel ischemic changes". Type of Study: Bedside Swallow Evaluation Previous Swallow Assessment: 06/2019 Diet Prior to this Study: Dysphagia 3 (soft);Thin liquids Temperature Spikes Noted: No(wbc 9.3) Respiratory Status: Nasal cannula(4L) History of Recent Intubation: No Behavior/Cognition: Alert;Confused;Agitated;Cooperative;Uncooperative;Requires cueing;Distractible Oral Cavity Assessment: (CNT ) Oral Care Completed by SLP: Recent completion by staff(CNT) Oral Cavity - Dentition: Adequate natural dentition(appeared; per report) Vision: (n/a) Self-Feeding Abilities: Total assist Patient Positioning: Upright in bed(needed positioning) Baseline Vocal  Quality: Normal Volitional Cough: Cognitively unable to elicit Volitional Swallow: Unable to elicit    Oral/Motor/Sensory Function Overall  Oral Motor/Sensory Function: Within functional limits(appeared WFL during bolus management, clearing; speech)   Ice Chips Ice chips: Within functional limits Presentation: Spoon(fed; 2 trials accepted)   Thin Liquid Thin Liquid: Impaired Presentation: Cup(fed; 2 trials accepted) Oral Phase Impairments: (none) Oral Phase Functional Implications: (none) Pharyngeal  Phase Impairments: Cough - Immediate(x1/2 trials)    Nectar Thick Nectar Thick Liquid: Impaired Presentation: Spoon(x2 trials accepted) Oral Phase Impairments: (none) Oral phase functional implications: (none) Pharyngeal Phase Impairments: Cough - Immediate(x1/2 trials)   Honey Thick Honey Thick Liquid: Not tested   Puree Puree: Within functional limits Presentation: Spoon(fed; 3 trials accepted)   Solid     Solid: Not tested Other Comments: pt declined; seemed agitated       Orinda Kenner, MS, CCC-SLP [facility] 10/25/2019,12:34 PM

## 2019-10-25 NOTE — Progress Notes (Signed)
PCXR completed. Neb treatment done and pt states feels a little better O2 sat improved to 97%. RR down to 28. Foley removed as ordered. Pt states she thinks she may be able to sleep a little. Lights out, and repositioned for comfort.

## 2019-10-25 NOTE — Progress Notes (Signed)
Progress Note  Patient Name: Diane Mullins Date of Encounter: 10/25/2019  Primary Cardiologist: Rockey Situ  Subjective   She is comfortable in bed, asking for water. Denies pain. Cannot answer more complicated questions on interview today.   Inpatient Medications    Scheduled Meds: . Chlorhexidine Gluconate Cloth  6 each Topical Daily  . heparin  5,000 Units Subcutaneous Q8H  . levothyroxine  150 mcg Oral Q0600  . mirtazapine  7.5 mg Oral QHS  . pantoprazole  40 mg Oral Daily  . simvastatin  20 mg Oral QHS   Continuous Infusions: . DOPamine 5 mcg/kg/min (10/25/19 0200)   PRN Meds: acetaminophen, ipratropium-albuterol, polyethylene glycol   Vital Signs    Vitals:   10/25/19 0329 10/25/19 0339 10/25/19 0344 10/25/19 0811  BP:   110/84 (!) 135/54  Pulse:  70 85 77  Resp: 18 20 20 16   Temp:  97.9 F (36.6 C)  97.6 F (36.4 C)  TempSrc:  Oral  Oral  SpO2:    98%  Weight:      Height:        Intake/Output Summary (Last 24 hours) at 10/25/2019 1142 Last data filed at 10/25/2019 0200 Gross per 24 hour  Intake 271.88 ml  Output 158 ml  Net 113.88 ml   Filed Weights   10/22/19 1121  Weight: 90.7 kg    Telemetry    Has now been predominantly sinus rhythm with rates in the 60s-80s - Personally Reviewed  ECG    No new tracings - Personally Reviewed  Physical Exam   GEN: frail appearing, resting comfortably in bed HEENT: bilateral ecchymoses across forehead and around eyes NECK: No JVD visible CARDIAC: regular rhythm, normal S1 and S2, no rubs or gallops. No murmur. VASCULAR: Radial and DP pulses 2+ bilaterally. No carotid bruits RESPIRATORY:  Clear to auscultation without rales, wheezing or rhonchi  ABDOMEN: Soft, non-tender, non-distended SKIN: Warm and dry, no significant edema NEUROLOGIC:  Pleasant, answers simple but not complex questions. Moves all limbs independently PSYCHIATRIC:  Normal affect   Labs    Chemistry Recent Labs  Lab 10/23/19 0444  10/24/19 0609 10/25/19 0425  NA 139 136 134*  K 5.2* 5.5* 5.8*  CL 99 92* 89*  CO2 27 29 28   GLUCOSE 80 120* 164*  BUN 32* 38* 45*  CREATININE 4.01* 5.18* 5.76*  CALCIUM 7.9* 7.6* 7.8*  PROT 5.6*  --   --   ALBUMIN 3.0*  2.7*  --   --   AST 36  --   --   ALT 28  --   --   ALKPHOS 176*  --   --   BILITOT 0.5  --   --   GFRNONAA 10* 7* 6*  GFRAA 11* 8* 7*  ANIONGAP 13 15 17*     Hematology Recent Labs  Lab 10/23/19 0444 10/24/19 0609 10/25/19 0425  WBC 7.3 6.7 9.3  RBC 3.54* 3.44* 3.88  HGB 10.3* 10.0* 11.4*  HCT 33.4* 31.6* 34.5*  MCV 94.4 91.9 88.9  MCH 29.1 29.1 29.4  MCHC 30.8 31.6 33.0  RDW 15.4 15.5 14.7  PLT 201 207 248    Cardiac EnzymesNo results for input(s): TROPONINI in the last 168 hours. No results for input(s): TROPIPOC in the last 168 hours.   BNPNo results for input(s): BNP, PROBNP in the last 168 hours.   DDimer No results for input(s): DDIMER in the last 168 hours.   Radiology    US RENAL  Result Date: 10/23/2019 IMPRESSION: 1. No hydronephrosis. 2. Normal size mildly echogenic kidneys, compatible with reported history of acute nonspecific renal parenchymal disease. Electronically Signed   By: Ilona Sorrel M.D.   On: 10/23/2019 09:08   DG Chest Portable 1 View  Result Date: 10/22/2019 IMPRESSION: Cardiomegaly without acute disease. Electronically Signed   By: Inge Rise M.D.   On: 10/22/2019 11:35     Cardiac Studies   2D Echo 10/23/2019: 1. Left ventricular ejection fraction, by visual estimation, is 55 to  60%. The left ventricle has normal function. There is no left ventricular  hypertrophy.  2. The left ventricle has no regional wall motion abnormalities.  3. Global right ventricle has normal systolic function.The right  ventricular size is mildly enlarged. No increase in right ventricular wall  thickness.  4. Left atrial size was severely dilated.  5. Moderately elevated pulmonary artery systolic pressure.  6. The  tricuspid regurgitant velocity is 3.45 m/s, and with an assumed  right atrial pressure of 10 mmHg, the estimated right ventricular systolic  pressure is moderately elevated at 57.7 mmHg.  7. Bradycardia noted, rate 41 bpm   Patient Profile     86 y.o. female with history of Afib/flutter diagnosed in 10/2018 s/p TEE/DCCV in 10/2018 and again in 11/2018 previously on Eliquis though stopped secondary to GI bleed in 11/2018, HFpEF/pulmonary hypertension, atrial tachycardia, DM2, HTN, HLD, prior stroke, vertigo, and recent fall in 09/2019 with gait instability who we are seeing for junctional rhythm in the setting of hyperkalemia with ARF and Coreg/amiodarone usage.   Assessment & Plan    1. Junctional rhythm: now improved to sinus rhythm in the 60s-80s -she responded well to dopamine -Likely multifactorial including Coreg, amiodarone, and hyperkalemia with last dose of these medications possibly 2/3, though she is not certain -The clearance of her medications is slowed by her ARF with a SCr now > 5 -Hyperkalemia persists, 5.8 today, but improved from 6.4 2/3  2. CAD involving the native coronary arteries without angina/elevated HS-Tn: -Mildly elevated high sensitivity troponin of 64 with a delta of 58 -Low risk MPI in 2019, denies any chest pain -Previously on Eliquis in place of ASA. On hold given renal disease, recent fall -Statin in place -No plans for inpatient ischemic evaluation at this time  3. History of atrial fibrillation and atrial flutter: -presented in junctional rhythm, now sinus on dopamine. -Coreg and amiodarone held as above -No longer on Eliquis given recent fall with head trauma in 09/2019 -CHADS2VASc at least 8 (CHF, HTN, age x 2, stroke x 2, vascular disease, sex category)  4. HFpEF/pulmonary hypertension: -Lasix held with ARF -Volume per nephrology   5. ARF on CKD stage III with hyperkalemia: -appreciate nephrology management  She is improved on dopamine now, though  this is not a long term plan. Heart rate, blood pressure now normalized. Would continue for now, monitor. Ideally renal function will begin to improve, but if not, will be difficult to manage her chronic diastolic heart failure. Continue to allow medications to wash out.   For questions or updates, please contact [place] Please consult [email or web address] for contact info under Cardiology/STEMI.    Signed, Buford Dresser, MD, PhD [facility]  [address] [place], [place] [address] [phone] 10/25/2019, 11:42 AM

## 2019-10-25 NOTE — Plan of Care (Signed)
  Problem: Education: Goal: Knowledge of General Education information will improve Description: Including pain rating scale, medication(s)/side effects and non-pharmacologic comfort measures Outcome: Not Progressing Note: Patient very disoriented, speech slurred. Evidence of recent fall with facial trauma. Will continue to monitor for improvement in neurological status for the remainder of the shift. Wenda Low [facility]

## 2019-10-25 NOTE — Progress Notes (Signed)
[place] attempted visit per referral from speech therapy; speech therapist mentioned grdtr. and pt. had been discussing pt.'s ability to make decisions re: goals of care --thought chaplain support may be helpful.  Gdtr. had left when [facility] visited; pt. in middle of meal.  CH told pt. he would return later after pt. finishes lunch.  When [facility] returned some time later, pt. was asleep.  [place] will follow up soon if possible.      10/25/19 1430  Clinical Encounter Type  Visited With Patient  Visit Type Initial  Referral From Other (Comment) (Speech Therapy)  Consult/Referral To Chaplain  Stress Factors  Patient Stress Factors Major life changes

## 2019-10-25 NOTE — Progress Notes (Signed)
PROGRESS NOTE    Diane Mullins  FAO:[id number] DOB: [birth year] DOA: 10/22/2019 PCP: [name], MD    Assessment & Plan:   Principal Problem:   Bradycardia Active Problems:   Hypertension   Diabetes ([place])   (HFpEF) heart failure with preserved ejection fraction ([place])   Atrial flutter [facility])   Fall    Diane Mullins is an 86 y.o. Caucasian female with past medical history significant for atrial fibrillation/flutter, s/p TEE/DCCV (2/4/2020and again November 25, 6960, diastolic heart failure, pulmonary hypertension, diabetes type 2, hypertension, prior stroke/TIA, GI bleed March 2020 , recent fall October 13, 2019 gait instability presenting to the hospital with weakness, bradycardia.   Acute hypoxic respiratory failure 2/2 fluid overload --CXR showed "Cardiomegaly with findings suggestive of congestive heart failure."  Pt received MIVF but had been anuric.   --O2 requirement peaked at 4L, currently sat stable in high 90's. PLAN: --IV Lasix 80mg  x2 today and also metolazone x1 this morning --Strict I/O  --continue suppl O2 to maintain sat >=92% --Keep on cont pulse ox  BRADYCARDIA In the setting of hyperkalemia, acute renal failure, also on beta-blocker with amiodarone at home.  Beta-blocker amiodarone held since admission.   --HR has been in 70's with dopamine@5 , with improved BP. PLAN: --needs 2-1/2-day washout, and probably more since kidney function severely reduced --No indication for pacing at this time.   --continue dopamine infusion @5  via peripheral IV  AKI likely ATN from hypoperfusion  --Minimum urine output since presentation, but started to have some urine output after starting diuretic. --Foley cath removed overnight due to pt's discomfort. --Nephrology consulted --Started having urine output after starting diuretic. PLAN: --continue diuresis as above to improve fluid overload and congestion. --Strict I/O --Family is leaning towards no dialysis, per pt's  wish  Hypoglycemia DM2 on insulin --Noted multiple low BG readings.  A1c from sept 2020 and Jan 2021 both wnl.  Pt is on Levemir 10u daily and Metformin at home. --BG now stable after holding insulin and home metformin.  HYPERKALEMIA --Treat with Kayexalate and Lasix.  HYPOMAGENSEMIA replete and recheck  Hx of AFLUTTER/FIB Holding carvedilol and amiodarone Patient on telemetry Monitor for pauses  HTN Hold carvedilol given significant bradycardia  ANXIETY AND DEPRESSION Continue mirtazepine    DVT prophylaxis: Heparin SQ Code Status: DNR  Family Communication: Updated daughter [name] on the phone today. Disposition Plan: Bradycardia leading to hypoperfusion leading to kidney failure leading to pulm congestion.  If kidney function does not improve in 2 days, and pt does not wish for dialysis, then will consult palliative care.     Subjective and Interval History:  Overnight, pt had increased O2 requirement to 4L and gurgling breath sounds.  No urine output noted.  Foley removed again this time due to pt discomfort.    During the day, pt reported continued dyspnea.  Not eating much but asked for ice chips.  No noted fever, N/V/D.  After IV diuretic given, pt started making some urine.     Objective: Vitals:   10/25/19 0344 10/25/19 0811 10/25/19 1224 10/25/19 1551  BP: 110/84 (!) 135/54 (!) 141/65 107/90  Pulse: 85 77 79 81  Resp: 20 16 16    Temp:  97.6 F (36.4 C) 97.6 F (36.4 C) 97.6 F (36.4 C)  TempSrc:  Oral Oral Oral  SpO2:  98% 98% 99%  Weight:      Height:        Intake/Output Summary (Last 24 hours) at 10/25/2019 1745 Last  data filed at 10/25/2019 0200 Gross per 24 hour  Intake 271.88 ml  Output 158 ml  Net 113.88 ml   Filed Weights   10/22/19 1121  Weight: 90.7 kg    Examination:   Constitutional: NAD, AAOx3, a little brighter today, clear headed HEENT: conjunctivae and lids normal, EOMI, bump on the top of the scalp, extensive bruising  around her eyes. CV: RRR. Distal pulses +2.  No cyanosis.   RESP: crackles over posterior lungs, normal respiratory effort, on 4L GI: +BS, NTND Extremities: No effusions, edema, or tenderness in BLE SKIN: warm, dry and intact Neuro: II - XII grossly intact.  Sensation intact Psych: Flat mood and affect.     Data Reviewed: I have personally reviewed following labs and imaging studies  CBC: Recent Labs  Lab 10/22/19 1119 10/23/19 0444 10/24/19 0609 10/25/19 0425  WBC 11.2* 7.3 6.7 9.3  NEUTROABS 7.6  --   --   --   HGB 11.5* 10.3* 10.0* 11.4*  HCT 36.8 33.4* 31.6* 34.5*  MCV 92.9 94.4 91.9 88.9  PLT 244 201 207 778   Basic Metabolic Panel: Recent Labs  Lab 10/22/19 1119 10/22/19 1119 10/22/19 1756 10/22/19 2211 10/23/19 0444 10/24/19 0609 10/25/19 0425  NA 138  --   --  139 139 136 134*  K 6.6*   < > 6.0* 6.4* 5.2* 5.5* 5.8*  CL 100  --   --  100 99 92* 89*  CO2 25  --   --  21* 27 29 28   GLUCOSE 116*  --   --  111* 80 120* 164*  BUN 26*  --   --  29* 32* 38* 45*  CREATININE 3.06*  --   --  3.86* 4.01* 5.18* 5.76*  CALCIUM 8.2*  --   --  8.0* 7.9* 7.6* 7.8*  MG 1.2*  --   --  1.6* 2.1 1.9 1.9   < > = values in this interval not displayed.   GFR: Estimated Creatinine Clearance: 7.2 mL/min (A) (by C-G formula based on SCr of 5.76 mg/dL (H)). Liver Function Tests: Recent Labs  Lab 10/23/19 0444  AST 36  ALT 28  ALKPHOS 176*  BILITOT 0.5  PROT 5.6*  ALBUMIN 3.0*  2.7*   No results for input(s): LIPASE, AMYLASE in the last 168 hours. No results for input(s): AMMONIA in the last 168 hours. Coagulation Profile: No results for input(s): INR, PROTIME in the last 168 hours. Cardiac Enzymes: No results for input(s): CKTOTAL, CKMB, CKMBINDEX, TROPONINI in the last 168 hours. BNP (last 3 results) No results for input(s): PROBNP in the last 8760 hours. HbA1C: No results for input(s): HGBA1C in the last 72 hours. CBG: Recent Labs  Lab 10/23/19 2046  10/24/19 0139 10/24/19 0833 10/25/19 0005 10/25/19 0150  GLUCAP 139* 127* 137* 177* 193*   Lipid Profile: No results for input(s): CHOL, HDL, LDLCALC, TRIG, CHOLHDL, LDLDIRECT in the last 72 hours. Thyroid Function Tests: Recent Labs    10/22/19 1756  TSH 2.881   Anemia Panel: No results for input(s): VITAMINB12, FOLATE, FERRITIN, TIBC, IRON, RETICCTPCT in the last 72 hours. Sepsis Labs: No results for input(s): PROCALCITON, LATICACIDVEN in the last 168 hours.  Recent Results (from the past 240 hour(s))  SARS CORONAVIRUS 2 (TAT 6-24 HRS) Nasopharyngeal Nasopharyngeal Swab     Status: None   Collection Time: 10/22/19  1:33 PM   Specimen: Nasopharyngeal Swab  Result Value Ref Range Status   SARS Coronavirus 2 NEGATIVE NEGATIVE Final  Comment: (NOTE) SARS-CoV-2 target nucleic acids are NOT DETECTED. The SARS-CoV-2 RNA is generally detectable in upper and lower respiratory specimens during the acute phase of infection. Negative results do not preclude SARS-CoV-2 infection, do not rule out co-infections with other pathogens, and should not be used as the sole basis for treatment or other patient management decisions. Negative results must be combined with clinical observations, patient history, and epidemiological information. The expected result is Negative. Fact Sheet for Patients: SugarRoll.be Fact Sheet for Healthcare Providers: https://www.woods-mathews.com/ This test is not yet approved or cleared by the [place] FDA and  has been authorized for detection and/or diagnosis of SARS-CoV-2 by FDA under an Emergency Use Authorization (EUA). This EUA will remain  in effect (meaning this test can be used) for the duration of the COVID-19 declaration under Section 56 4(b)(1) of the Act, 21 U.S.C. section 360bbb-3(b)(1), unless the authorization is terminated or revoked sooner. Performed at [facility], [place] [address],  [place], [place] [address]       Radiology Studies: DG Chest 1 View  Result Date: 10/25/2019 CLINICAL DATA:  Shortness of breath EXAM: CHEST  1 VIEW COMPARISON:  10/22/2019 FINDINGS: Heart size is enlarged. There are worsening interstitial lung markings bilaterally. There is no pneumothorax. There may be small bilateral pleural effusions, right greater than left, new from prior study. There is no acute osseous abnormality. IMPRESSION: Cardiomegaly with findings suggestive of congestive heart failure. A superimposed atypical infectious process cannot be excluded. Electronically Signed   By: Constance Holster M.D.   On: 10/25/2019 02:16   ECHOCARDIOGRAM COMPLETE  Result Date: 10/23/2019   ECHOCARDIOGRAM REPORT   Patient Name:   Diane Mullins Date of Exam: 10/23/2019 Medical Rec #:  [id number]     Height:       60.0 in Accession #:    [id number]    Weight:       200.0 lb Date of Birth:  03-04-1933      BSA:          1.87 m Patient Age:    41 years      BP:           104/36 mmHg Patient Gender: F             HR:           42 bpm. Exam Location:  [facility] Procedure: 2D Echo Indications:     ATRIAL FIBRILLATION 427.31/I48.91  History:         Patient has prior history of Echocardiogram examinations, most                  recent 06/17/2019. TIA, Arrythmias:Atrial Flutter,                  Signs/Symptoms:Shortness of Breath; Risk Factors:Dyslipidemia                  and Diabetes.  Sonographer:     Avanell Shackleton Referring Phys:  [id number] Arvil Chaco Diagnosing Phys: Ida Rogue MD  Sonographer Comments: Technically difficult study due to poor echo windows. Image acquisition challenging due to patient body habitus. PT WAS UNABLE TO TURN ON HER LEFT SIDE IMPRESSIONS  1. Left ventricular ejection fraction, by visual estimation, is 55 to 60%. The left ventricle has normal function. There is no left ventricular hypertrophy.  2. The left ventricle has no regional wall motion abnormalities.  3. Global right ventricle has  normal systolic function.The right ventricular size  is mildly enlarged. No increase in right ventricular wall thickness.  4. Left atrial size was severely dilated.  5. Moderately elevated pulmonary artery systolic pressure.  6. The tricuspid regurgitant velocity is 3.45 m/s, and with an assumed right atrial pressure of 10 mmHg, the estimated right ventricular systolic pressure is moderately elevated at 57.7 mmHg.  7. Bradycardia noted, rate 41 bpm FINDINGS  Left Ventricle: Left ventricular ejection fraction, by visual estimation, is 55 to 60%. The left ventricle has normal function. The left ventricle has no regional wall motion abnormalities. There is no left ventricular hypertrophy. Normal left atrial pressure. Right Ventricle: The right ventricular size is mildly enlarged. No increase in right ventricular wall thickness. Global RV systolic function is has normal systolic function. The tricuspid regurgitant velocity is 3.45 m/s, and with an assumed right atrial  pressure of 10 mmHg, the estimated right ventricular systolic pressure is moderately elevated at 57.7 mmHg. Left Atrium: Left atrial size was severely dilated. Right Atrium: Right atrial size was normal in size Pericardium: There is no evidence of pericardial effusion. Mitral Valve: The mitral valve is normal in structure. Mild mitral valve regurgitation. No evidence of mitral valve stenosis by observation. Tricuspid Valve: The tricuspid valve is normal in structure. Tricuspid valve regurgitation is mild-moderate. Aortic Valve: The aortic valve was not well visualized. Aortic valve regurgitation is not visualized. The aortic valve is structurally normal, with no evidence of sclerosis or stenosis. Pulmonic Valve: The pulmonic valve was normal in structure. Pulmonic valve regurgitation is trivial. Pulmonic regurgitation is trivial. Aorta: The aortic root, ascending aorta and aortic arch are all structurally normal, with no evidence of dilitation or  obstruction. Venous: The inferior vena cava is normal in size with greater than 50% respiratory variability, suggesting right atrial pressure of 3 mmHg. IAS/Shunts: No atrial level shunt detected by color flow Doppler. There is no evidence of a patent foramen ovale. No ventricular septal defect is seen or detected. There is no evidence of an atrial septal defect.  LEFT VENTRICLE PLAX 2D LVIDd:         4.07 cm LVIDs:         2.15 cm LV PW:         1.33 cm LV IVS:        1.17 cm LVOT diam:     1.90 cm LV SV:         58 ml LV SV Index:   28.68 LVOT Area:     2.84 cm  LEFT ATRIUM              Index       RIGHT ATRIUM           Index LA diam:        3.60 cm  1.93 cm/m  RA Area:     23.50 cm LA Vol (A2C):   119.0 ml 63.75 ml/m RA Volume:   81.70 ml  43.77 ml/m LA Vol (A4C):   96.0 ml  51.43 ml/m LA Biplane Vol: 111.0 ml 59.47 ml/m   AORTA Ao Root diam: 3.40 cm MR Peak grad: 78.1 mmHg   TRICUSPID VALVE MR Mean grad: 51.0 mmHg   TR Peak grad:   47.7 mmHg MR Vmax:      442.00 cm/s TR Vmax:        369.00 cm/s MR Vmean:     336.0 cm/s  SHUNTS                           Systemic Diam: 1.90 cm  Ida Rogue MD Electronically signed by Ida Rogue MD Signature Date/Time: 10/23/2019/7:19:31 PM    Final      Scheduled Meds: . Chlorhexidine Gluconate Cloth  6 each Topical Daily  . heparin  5,000 Units Subcutaneous Q8H  . levothyroxine  150 mcg Oral Q0600  . mirtazapine  7.5 mg Oral QHS  . pantoprazole  40 mg Oral Daily  . simvastatin  20 mg Oral QHS  . sodium zirconium cyclosilicate  10 g Oral Daily   Continuous Infusions: . DOPamine 5 mcg/kg/min (10/25/19 0200)     LOS: 3 days     Enzo Bi, MD [facility] If 7PM-7AM, please contact night-coverage 10/25/2019, 5:45 PM

## 2019-10-25 NOTE — Progress Notes (Signed)
Discussed CXR results with E. Ouma. Pt is afebrile; will monitor AM CBC for increased WBC. Right AC IV access with dopamine intact with good blood return, however pt bends arm and frequently interrupts infusion; discussed with E. Ouma. Will consult IVT for placement of midline catheter.

## 2019-10-25 NOTE — Progress Notes (Signed)
Pt complains of breathing being "short". E. Oma notified and received orders for Duo neb treatment and CXR. Also received order to remove foley catheter.

## 2019-10-25 NOTE — Progress Notes (Signed)
Physical Therapy Evaluation Patient Details Name: Diane Mullins MRN: [id number] DOB: 1933/04/15 Today's Date: 10/25/2019   History of Present Illness  86 y.o. female with multiple complex medical problems including diabetes type 2, insulin-dependent, hypertension, hypothyroidism, hyperlipidemia, congestive heart failure, pulmonary hypertension, history of stroke, atrial fibrillation  admitted on 10/22/2019 for AKA, A-fib.  Clinical Impression  Patient is difficult to understand but she does try to verbalize. PT examination reveals grossly weak LE's, 1/5 hip flex, 2/5 hip abd bilaterally, with need of total assist for bed mobility. Patient has significantly impaired balance, She reports that she could transfer from bed to chair with assist at her Assisted living facility, home place.  Pt will benefit from skilled PT services to address deficits in balance and strength in order to improve function and decrease fall risk.      Follow Up Recommendations SNF    Equipment Recommendations  None recommended by PT    Recommendations for Other Services       Precautions / Restrictions Restrictions Weight Bearing Restrictions: No      Mobility  Bed Mobility Overal bed mobility: Needs Assistance Bed Mobility: Rolling;Sidelying to Sit;Supine to Sit Rolling: Total assist Sidelying to sit: Total assist Supine to sit: Total assist        Transfers Overall transfer level: (unable)                  Ambulation/Gait                Stairs            Wheelchair Mobility    Modified Rankin (Stroke Patients Only)       Balance Overall balance assessment: Needs assistance Sitting-balance support: Feet supported Sitting balance-Leahy Scale: Poor   Postural control: Posterior lean                                   Pertinent Vitals/Pain Pain Assessment: No/denies pain    Home Living Family/patient expects to be discharged to:: Assisted living                      Prior Function Level of Independence: Needs assistance   Gait / Transfers Assistance Needed: (needed assist with transfers at [facility]isted living)     Comments: (difficult to understand PLOF)     Hand Dominance        Extremity/Trunk Assessment   Upper Extremity Assessment Upper Extremity Assessment: Generalized weakness    Lower Extremity Assessment Lower Extremity Assessment: Generalized weakness(2/5 hip abd, 1/5 hip flex bilaterally)       Communication   Communication: Expressive difficulties  Cognition Arousal/Alertness: Awake/alert Behavior During Therapy: Flat affect Overall Cognitive Status: No family/caregiver present to determine baseline cognitive functioning                                 General Comments: reports she doesnt feel good      General Comments      Exercises General Exercises - Lower Extremity Heel Slides: PROM;AAROM;Both;5 reps   Assessment/Plan    PT Assessment Patient needs continued PT services  PT Problem List Decreased strength;Decreased balance;Decreased mobility       PT Treatment Interventions Therapeutic activities;Therapeutic exercise    PT Goals (Current goals can be found in the Care Plan section)  Acute Rehab PT Goals Patient  Stated Goal: none stated PT Goal Formulation: Patient unable to participate in goal setting Time For Goal Achievement: 11/08/19 Potential to Achieve Goals: Fair    Frequency Min 2X/week   Barriers to discharge        Co-evaluation               AM-PAC PT "6 Clicks" Mobility  Outcome Measure Help needed turning from your back to your side while in a flat bed without using bedrails?: Total Help needed moving from lying on your back to sitting on the side of a flat bed without using bedrails?: Total Help needed moving to and from a bed to a chair (including a wheelchair)?: Total Help needed standing up from a chair using your arms (e.g., wheelchair or  bedside chair)?: Total Help needed to walk in hospital room?: Total Help needed climbing 3-5 steps with a railing? : Total 6 Click Score: 6    End of Session Equipment Utilized During Treatment: Gait belt Activity Tolerance: Patient limited by fatigue;Patient limited by lethargy Patient left: in bed;with bed alarm set Nurse Communication: Mobility status PT Visit Diagnosis: Muscle weakness (generalized) (M62.81)    Time: 4718-5501 PT Time Calculation (min) (ACUTE ONLY): 35 min   Charges:     PT Treatments $Therapeutic Activity: 8-22 mins          Alanson Puls, PT DPT 10/25/2019, 3:17 PM

## 2019-10-26 DIAGNOSIS — I5033 Acute on chronic diastolic (congestive) heart failure: Secondary | ICD-10-CM

## 2019-10-26 LAB — CBC
HCT: 35.6 % — ABNORMAL LOW (ref 36.0–46.0)
Hemoglobin: 11.8 g/dL — ABNORMAL LOW (ref 12.0–15.0)
MCH: 29.1 pg (ref 26.0–34.0)
MCHC: 33.1 g/dL (ref 30.0–36.0)
MCV: 87.7 fL (ref 80.0–100.0)
Platelets: 268 10*3/uL (ref 150–400)
RBC: 4.06 MIL/uL (ref 3.87–5.11)
RDW: 14.5 % (ref 11.5–15.5)
WBC: 8.8 10*3/uL (ref 4.0–10.5)
nRBC: 0 % (ref 0.0–0.2)

## 2019-10-26 LAB — BASIC METABOLIC PANEL
Anion gap: 17 — ABNORMAL HIGH (ref 5–15)
Anion gap: 15 (ref 5–15)
BUN: 54 mg/dL — ABNORMAL HIGH (ref 8–23)
BUN: 55 mg/dL — ABNORMAL HIGH (ref 8–23)
CO2: 30 mmol/L (ref 22–32)
CO2: 32 mmol/L (ref 22–32)
Calcium: 7.7 mg/dL — ABNORMAL LOW (ref 8.9–10.3)
Calcium: 7.5 mg/dL — ABNORMAL LOW (ref 8.9–10.3)
Chloride: 86 mmol/L — ABNORMAL LOW (ref 98–111)
Chloride: 87 mmol/L — ABNORMAL LOW (ref 98–111)
Creatinine, Ser: 6.07 mg/dL — ABNORMAL HIGH (ref 0.44–1.00)
Creatinine, Ser: 6.06 mg/dL — ABNORMAL HIGH (ref 0.44–1.00)
GFR calc Af Amer: 7 mL/min — ABNORMAL LOW (ref >60)
GFR calc Af Amer: 7 mL/min — ABNORMAL LOW (ref >60)
GFR calc non Af Amer: 6 mL/min — ABNORMAL LOW (ref >60)
GFR calc non Af Amer: 6 mL/min — ABNORMAL LOW (ref >60)
Glucose, Bld: 144 mg/dL — ABNORMAL HIGH (ref 70–99)
Glucose, Bld: 136 mg/dL — ABNORMAL HIGH (ref 70–99)
Potassium: 6 mmol/L — ABNORMAL HIGH (ref 3.5–5.1)
Potassium: 4.7 mmol/L (ref 3.5–5.1)
Sodium: 133 mmol/L — ABNORMAL LOW (ref 135–145)
Sodium: 134 mmol/L — ABNORMAL LOW (ref 135–145)

## 2019-10-26 LAB — MAGNESIUM: Magnesium: 1.9 mg/dL (ref 1.7–2.4)

## 2019-10-26 LAB — MRSA PCR SCREENING: MRSA by PCR: NEGATIVE

## 2019-10-26 MED ORDER — FUROSEMIDE 10 MG/ML IJ SOLN
80.0000 mg | Freq: Once | INTRAMUSCULAR | Status: AC
  Administered 2019-10-26: 09:00:00 80 mg via INTRAVENOUS
  Filled 2019-10-26: qty 8

## 2019-10-26 MED ORDER — FUROSEMIDE 10 MG/ML IJ SOLN
10.0000 mg/h | INTRAVENOUS | Status: DC
  Administered 2019-10-26 (×2): 10 mg/h via INTRAVENOUS
  Filled 2019-10-26: qty 25

## 2019-10-26 MED ORDER — SODIUM POLYSTYRENE SULFONATE 15 GM/60ML PO SUSP
15.0000 g | Freq: Once | ORAL | Status: AC
  Administered 2019-10-26: 15 g via ORAL
  Filled 2019-10-26: qty 60

## 2019-10-26 MED ORDER — METOLAZONE 5 MG PO TABS
5.0000 mg | ORAL_TABLET | Freq: Once | ORAL | Status: AC
  Administered 2019-10-26: 5 mg via ORAL
  Filled 2019-10-26: qty 1

## 2019-10-26 MED ORDER — FUROSEMIDE 10 MG/ML IJ SOLN
80.0000 mg | Freq: Once | INTRAMUSCULAR | Status: AC
  Administered 2019-10-26: 16:00:00 80 mg via INTRAVENOUS
  Filled 2019-10-26: qty 8

## 2019-10-26 MED ORDER — FUROSEMIDE 10 MG/ML IJ SOLN
80.0000 mg | Freq: Four times a day (QID) | INTRAMUSCULAR | Status: DC
  Administered 2019-10-26 – 2019-10-27 (×4): 80 mg via INTRAVENOUS
  Filled 2019-10-26 (×4): qty 8

## 2019-10-26 NOTE — Plan of Care (Signed)
  Problem: Education: Goal: Knowledge of General Education information will improve Description Including pain rating scale, medication(s)/side effects and non-pharmacologic comfort measures Outcome: Progressing   

## 2019-10-26 NOTE — Plan of Care (Signed)
  Problem: Elimination: Goal: Will not experience complications related to urinary retention Outcome: Progressing   PTS CREATININE IS REMAINING STABLE AT 6.06 Problem: Clinical Measurements: Goal: Diagnostic test results will improve Outcome: Not Progressing  PT IS NPO DUE TO ASPIRATION  Problem: Nutrition: Goal: Adequate nutrition will be maintained Outcome: Not Progressing

## 2019-10-26 NOTE — Progress Notes (Signed)
Upon medication admin, given in nectar thick liquids patient began to cough and choke and  produce thick secretions that came out. Md notified, pt is now npo except a few ice chips.

## 2019-10-26 NOTE — Progress Notes (Signed)
Progress Note  Patient Name: Diane Mullins Date of Encounter: 10/26/2019  Primary Cardiologist: Rockey Situ  Subjective   Awake, face timing with family with the assistance of her family member. She has somewhat garbled speech but attempts to respond to questions. Denies chest pain. Discussed with her family at bedside, all questions answered.  Inpatient Medications    Scheduled Meds: . Chlorhexidine Gluconate Cloth  6 each Topical Daily  . heparin  5,000 Units Subcutaneous Q8H  . sodium chloride flush  10 mL Intravenous Q12H  . sodium zirconium cyclosilicate  10 g Oral Daily   Continuous Infusions: . DOPamine 2.5 mcg/kg/min (10/26/19 1031)  . furosemide (LASIX) infusion 10 mg/hr (10/26/19 0923)   PRN Meds: acetaminophen, ipratropium-albuterol, polyethylene glycol   Vital Signs    Vitals:   10/25/19 1949 10/26/19 0401 10/26/19 0748 10/26/19 1103  BP: (!) 152/72 115/77 (!) 146/67 (!) 119/51  Pulse: 83 82 82 77  Resp: 18 19 16 18   Temp: (!) 97.5 F (36.4 C) (!) 97.5 F (36.4 C) 98.2 F (36.8 C) 98.2 F (36.8 C)  TempSrc: Oral Oral Oral Oral  SpO2: 98% 97% 97% 98%  Weight:      Height:        Intake/Output Summary (Last 24 hours) at 10/26/2019 1322 Last data filed at 10/26/2019 1100 Gross per 24 hour  Intake --  Output 1800 ml  Net -1800 ml   Filed Weights   10/22/19 1121  Weight: 90.7 kg    Telemetry    Went back to largely junctional rhythm in the 40s, with occasional sinus rhythm. Heart rate has been able to get up to 80-90 bpm - Personally Reviewed  ECG    No new tracings  since 10/22/19- Personally Reviewed  Physical Exam   GEN: Frail appearing, but has makeup on, face timing with her family with assistance HEENT: bilateral ecchymoses still present but less apparent with makeup today NECK: No JVD visible CARDIAC: regular rhythm, normal S1 and S2, no rubs or gallops. No murmur. VASCULAR: Radial and DP pulses 2+ bilaterally.  RESPIRATORY:  Clear to  auscultation without rales, wheezing or rhonchi  ABDOMEN: Soft, non-tender, non-distended MUSCULOSKELETAL:  Ambulates independently SKIN: Warm and dry, trivial to 1+ edema NEUROLOGIC:  Pleasant, tries to give answers to questions but speech somewhat garbled. Moves all limbs independently. PSYCHIATRIC:  Normal affect   Labs    Chemistry Recent Labs  Lab 10/23/19 0444 10/23/19 0444 10/24/19 0609 10/25/19 0425 10/26/19 0604  NA 139   < > 136 134* 133*  K 5.2*   < > 5.5* 5.8* 6.0*  CL 99   < > 92* 89* 86*  CO2 27   < > 29 28 30   GLUCOSE 80   < > 120* 164* 144*  BUN 32*   < > 38* 45* 54*  CREATININE 4.01*   < > 5.18* 5.76* 6.07*  CALCIUM 7.9*   < > 7.6* 7.8* 7.7*  PROT 5.6*  --   --   --   --   ALBUMIN 3.0*  2.7*  --   --   --   --   AST 36  --   --   --   --   ALT 28  --   --   --   --   ALKPHOS 176*  --   --   --   --   BILITOT 0.5  --   --   --   --  GFRNONAA 10*   < > 7* 6* 6*  GFRAA 11*   < > 8* 7* 7*  ANIONGAP 13   < > 15 17* 17*   < > = values in this interval not displayed.     Hematology Recent Labs  Lab 10/24/19 0609 10/25/19 0425 10/26/19 0604  WBC 6.7 9.3 8.8  RBC 3.44* 3.88 4.06  HGB 10.0* 11.4* 11.8*  HCT 31.6* 34.5* 35.6*  MCV 91.9 88.9 87.7  MCH 29.1 29.4 29.1  MCHC 31.6 33.0 33.1  RDW 15.5 14.7 14.5  PLT 207 248 268    Cardiac EnzymesNo results for input(s): TROPONINI in the last 168 hours. No results for input(s): TROPIPOC in the last 168 hours.   BNPNo results for input(s): BNP, PROBNP in the last 168 hours.   DDimer No results for input(s): DDIMER in the last 168 hours.   Radiology    US RENAL  Result Date: 10/23/2019 IMPRESSION: 1. No hydronephrosis. 2. Normal size mildly echogenic kidneys, compatible with reported history of acute nonspecific renal parenchymal disease. Electronically Signed   By: Ilona Sorrel M.D.   On: 10/23/2019 09:08   DG Chest Portable 1 View  Result Date: 10/22/2019 IMPRESSION: Cardiomegaly without acute  disease. Electronically Signed   By: Inge Rise M.D.   On: 10/22/2019 11:35     Cardiac Studies   2D Echo 10/23/2019: 1. Left ventricular ejection fraction, by visual estimation, is 55 to  60%. The left ventricle has normal function. There is no left ventricular  hypertrophy.  2. The left ventricle has no regional wall motion abnormalities.  3. Global right ventricle has normal systolic function.The right  ventricular size is mildly enlarged. No increase in right ventricular wall  thickness.  4. Left atrial size was severely dilated.  5. Moderately elevated pulmonary artery systolic pressure.  6. The tricuspid regurgitant velocity is 3.45 m/s, and with an assumed  right atrial pressure of 10 mmHg, the estimated right ventricular systolic  pressure is moderately elevated at 57.7 mmHg.  7. Bradycardia noted, rate 41 bpm   Patient Profile     86 y.o. female with history of Afib/flutter diagnosed in 10/2018 s/p TEE/DCCV in 10/2018 and again in 11/2018 previously on Eliquis though stopped secondary to GI bleed in 11/2018, HFpEF/pulmonary hypertension, atrial tachycardia, DM2, HTN, HLD, prior stroke, vertigo, and recent fall in 09/2019 with gait instability who we are seeing for junctional rhythm in the setting of hyperkalemia with ARF and Coreg/amiodarone usage.   Assessment & Plan    1. Junctional rhythm: had more sinus rhythm when dopamine was at 5 mcg/kg/min. Now back to largely junctional with intermittent sinus at lower dose dopamine. -back with more frequent junctional rhythm, occasional sinus. Dopamine at 2.5 mcg/kg/min currently. She feels well, and she has rates to the 80s today. If she drops back to persistent junctional bradycardia at 40 bpm range, would increase dopamine back up to 5 mcg/kg/min -Likely multifactorial including Coreg, amiodarone, and hyperkalemia with last dose of these medications possibly 2/3, though she is not certain -The clearance of her medications is  slowed by her ARF with rising creatinine  2. CAD involving the native coronary arteries without angina/elevated HS-Tn: denies any chest pain -Mildly elevated high sensitivity troponin of 64 with a delta of 58 -Low risk MPI in 2019, denies any chest pain -Previously on Eliquis in place of ASA. On hold given renal disease, recent fall -Statin discontinued today by primary team -No plans for inpatient ischemic evaluation  at this time  3. History of atrial fibrillation and atrial flutter: -presented in junctional rhythm, now intermittent junctional and sinus on dopamine. -Coreg and amiodarone held as above -No longer on Eliquis given recent fall with head trauma in 09/2019 -CHADS2VASc at least 8 (CHF, HTN, age x 2, stroke x 2, vascular disease, sex category)  4. HFpEF/pulmonary hypertension: -has been net negative approximately 1.8 L in last 24 hours on lasix drip -appreciate nephrology management, see below re: difficult decisions  5. ARF on CKD stage III with hyperkalemia: -appreciate nephrology management -K 6.0 today, Cr 6.07. Difficult position--she is responding to lasix drip, but renal function has been progressively declining. -reviewed nephrology discussion from yesterday with family. If renal function continues to decline, poor prognosis. My interpretation of the notes is that dialysis is not what the patient or family would want. -family at bedside, expectant management/monitoring of renal function is the plan.   Very difficult situation. If renal failure progresses, this may be life limiting. Has gone back into intermittent junctional rhythm with lower dose of dopamine. We will follow along, but there are limited options from a cardiovascular standpoint for management.  For questions or updates, please contact [place] Please consult [email or web address] for contact info under Cardiology/STEMI.   Signed, Buford Dresser, MD, PhD [facility]  [address] [place], [place] [address] [phone] 10/26/2019, 1:22 PM

## 2019-10-26 NOTE — Progress Notes (Signed)
Diane Mullins, Diane Mullins 10/26/19  Subjective:   Hospital day # 4 Very lethargic today.  Speech is difficult to understand Nurse report patient aspirated with thickened liquids this morning.  She is now going to be n.p.o. Renal: 02/06 0701 - 02/07 0700 In: -  Out: 1300 [Urine:1300] Lab Results  Component Value Date   CREATININE 6.07 (H) 10/26/2019   CREATININE 5.76 (H) 10/25/2019   CREATININE 5.18 (H) 10/24/2019     Objective:  Vital signs in last 24 hours:  Temp:  [97.5 F (36.4 C)-98.2 F (36.8 C)] 98.2 F (36.8 C) (02/07 1103) Pulse Rate:  [77-83] 77 (02/07 1103) Resp:  [16-19] 18 (02/07 1103) BP: (107-152)/(51-90) 119/51 (02/07 1103) SpO2:  [97 %-99 %] 98 % (02/07 1103)  Weight change:  Filed Weights   10/22/19 1121  Weight: 90.7 kg    Intake/Output:    Intake/Output Summary (Last 24 hours) at 10/26/2019 1329 Last data filed at 10/26/2019 1100 Gross per 24 hour  Intake --  Output 1800 ml  Net -1800 ml     Physical Exam: General:  Frail, elderly woman, laying in the bed  HEENT  periorbital ecchymosis bilaterally  Pulm/lungs  coarse breath sounds, [place] O2  CVS/Heart  irregular rhythm  Abdomen:   Soft  Extremities:  + dependent edema  Neurologic:  Alert, responds to voice  Skin:  Warm  foley  Basic Metabolic Panel:  Recent Labs  Lab 10/22/19 2211 10/22/19 2211 10/23/19 0444 10/23/19 0444 10/24/19 0609 10/25/19 0425 10/26/19 0604  NA 139  --  139  --  136 134* 133*  K 6.4*  --  5.2*  --  5.5* 5.8* 6.0*  CL 100  --  99  --  92* 89* 86*  CO2 21*  --  27  --  29 28 30   GLUCOSE 111*  --  80  --  120* 164* 144*  BUN 29*  --  32*  --  38* 45* 54*  CREATININE 3.86*  --  4.01*  --  5.18* 5.76* 6.07*  CALCIUM 8.0*   < > 7.9*   < > 7.6* 7.8* 7.7*  MG 1.6*  --  2.1  --  1.9 1.9 1.9   < > = values in this interval not displayed.     CBC: Recent Labs  Lab 10/22/19 1119 10/23/19 0444 10/24/19 0609 10/25/19 0425  10/26/19 0604  WBC 11.2* 7.3 6.7 9.3 8.8  NEUTROABS 7.6  --   --   --   --   HGB 11.5* 10.3* 10.0* 11.4* 11.8*  HCT 36.8 33.4* 31.6* 34.5* 35.6*  MCV 92.9 94.4 91.9 88.9 87.7  PLT 244 201 207 248 268      Lab Results  Component Value Date   HEPBSAG NON REACTIVE 10/23/2019   HEPBSAB NON REACTIVE 10/23/2019   HEPBIGM NON REACTIVE 10/23/2019      Microbiology:  Recent Results (from the past 240 hour(s))  SARS CORONAVIRUS 2 (TAT 6-24 HRS) Nasopharyngeal Nasopharyngeal Swab     Status: None   Collection Time: 10/22/19  1:33 PM   Specimen: Nasopharyngeal Swab  Result Value Ref Range Status   SARS Coronavirus 2 NEGATIVE NEGATIVE Final    Comment: (NOTE) SARS-CoV-2 target nucleic acids are NOT DETECTED. The SARS-CoV-2 RNA is generally detectable in upper and lower respiratory specimens during the acute phase of infection. Negative results do not preclude SARS-CoV-2 infection, do not rule out co-infections with other pathogens, and should not be used as  the sole basis for treatment or other patient management decisions. Negative results must be combined with clinical observations, patient history, and epidemiological information. The expected result is Negative. Fact Sheet for Patients: SugarRoll.be Fact Sheet for Healthcare Providers: https://www.woods-mathews.com/ This test is not yet approved or cleared by the [place] FDA and  has been authorized for detection and/or diagnosis of SARS-CoV-2 by FDA under an Emergency Use Authorization (EUA). This EUA will remain  in effect (meaning this test can be used) for the duration of the COVID-19 declaration under Section 56 4(b)(1) of the Act, 21 U.S.C. section 360bbb-3(b)(1), unless the authorization is terminated or revoked sooner. Performed at [facility], [place] [address] [place], [place] [address]   MRSA PCR Screening     Status: None   Collection Time: 10/26/19  6:30 AM    Specimen: Nasopharyngeal  Result Value Ref Range Status   MRSA by PCR NEGATIVE NEGATIVE Final    Comment:        The GeneXpert MRSA Assay (FDA approved for NASAL specimens only), is one component of a comprehensive MRSA colonization surveillance program. It is not intended to diagnose MRSA infection nor to guide or monitor treatment for MRSA infections. Performed at [facility], [address] [place], [place] [address]     Coagulation Studies: No results for input(s): LABPROT, INR in the last 72 hours.  Urinalysis: Recent Labs    10/25/19 1257  COLORURINE YELLOW*  LABSPEC 1.008  PHURINE 6.0  GLUCOSEU NEGATIVE  HGBUR SMALL*  BILIRUBINUR NEGATIVE  KETONESUR NEGATIVE  PROTEINUR NEGATIVE  NITRITE NEGATIVE  LEUKOCYTESUR MODERATE*      Imaging: DG Chest 1 View  Result Date: 10/25/2019 CLINICAL DATA:  Shortness of breath EXAM: CHEST  1 VIEW COMPARISON:  10/22/2019 FINDINGS: Heart size is enlarged. There are worsening interstitial lung markings bilaterally. There is no pneumothorax. There may be small bilateral pleural effusions, right greater than left, new from prior study. There is no acute osseous abnormality. IMPRESSION: Cardiomegaly with findings suggestive of congestive heart failure. A superimposed atypical infectious process cannot be excluded. Electronically Signed   By: Constance Holster M.D.   On: 10/25/2019 02:16     Medications:   . DOPamine 2.5 mcg/kg/min (10/26/19 1031)  . furosemide (LASIX) infusion 10 mg/hr (10/26/19 0923)   . Chlorhexidine Gluconate Cloth  6 each Topical Daily  . heparin  5,000 Units Subcutaneous Q8H  . sodium chloride flush  10 mL Intravenous Q12H  . sodium zirconium cyclosilicate  10 g Oral Daily   acetaminophen, ipratropium-albuterol, polyethylene glycol  Assessment/ Plan:  86 y.o. female with multiple complex medical problems including diabetes type 2, insulin-dependent, hypertension, hypothyroidism, hyperlipidemia,  congestive heart failure, pulmonary hypertension, history of stroke, atrial fibrillation  admitted on 10/22/2019 for Acute kidney failure, unspecified ([place]) [N17.9] Hyperkalemia [E87.5] Bradycardia [R00.1] Atrial fibrillation, unspecified type ([place]) [I48.91]  #.  Acute renal failure on chronic kidney disease stage IIIb Baseline creatinine of 1.18/GFR 43 on August 06, 2019 Underlying CKD likely secondary to diabetic nephropathy AKI likely secondary to cardiorenal syndrome from poor perfusion related to cardiac arrhythmias  Patient is responding some to IV furosemide infusion with increased urine output however her BUN, creatinine are worse.  Potassium is elevated today at 6.0. Overall patient's prognosis is poor.  Patient did not want aggressive treatment such as dialysis as per discussion yesterday with her granddaughter, Mendel Ryder Conservative management for now Continue IV Lasix for volume management Agree with family for nondialytic management If renal  function continues to worsen, consider hospice  #Hyperkalemia [place]ma IV furosemide  #Congestive heart failure with pulmonary hypertension, atrial fibrillation, coronary disease Complex cardiac physiology Managed with IV Lasix and IV dopamine Cardiology team is following    LOS: [place] 2/7/20211:29 PM  [place], [place]  Note: This note was prepared with Dragon dictation. Any transcription errors are unintentional

## 2019-10-26 NOTE — Progress Notes (Signed)
PROGRESS NOTE    Diane Mullins  ZOX:[id number] DOB: 1932-12-13 DOA: 10/22/2019 PCP: [name], MD    Assessment & Plan:   Principal Problem:   Bradycardia Active Problems:   Hypertension   Diabetes ([place])   (HFpEF) heart failure with preserved ejection fraction ([place])   Atrial flutter [facility])   Fall    Diane Mullins is an 86 y.o. Caucasian female with past medical history significant for atrial fibrillation/flutter, s/p TEE/DCCV (2/4/2020and again November 25, 8117, diastolic heart failure, pulmonary hypertension, diabetes type 2, hypertension, prior stroke/TIA, GI bleed March 2020 , recent fall October 13, 2019 gait instability presenting to the hospital with weakness, bradycardia.   Acute hypoxic respiratory failure 2/2 fluid overload --CXR showed "Cardiomegaly with findings suggestive of congestive heart failure."  Pt received MIVF but had been anuric.   --O2 requirement peaked at 4L, currently sat stable in high 90's. PLAN: --metolazone x1 this morning, --continue IV lasix 80 mg q6h scheduled --d/c lasix gtt since it did not appear to be useful (urine output stopped about 6 hours of IV lasix 80 mg). --Strict I/O  --continue suppl O2 to maintain sat >=92% --Keep on cont pulse ox  BRADYCARDIA In the setting of hyperkalemia, acute renal failure, also on beta-blocker with amiodarone at home.  Beta-blocker amiodarone held since admission.   --HR has been in 70's with dopamine@5 , with improved BP. PLAN: --needs 2-1/2-day washout, and probably more since kidney function severely reduced --No indication for pacing at this time.   --continue dopamine infusion @5  via peripheral IV  AKI likely ATN from hypoperfusion and cardiorenal syndrome --Minimum urine output since presentation, but started to have some urine output after starting diuretic. --Foley cath removed overnight due to pt's discomfort. --Nephrology consulted --Started having urine output after starting  diuretic. PLAN: --continue diuresis as above to improve fluid overload and congestion. --Strict I/O --Family is leaning towards no dialysis, per pt's wish  Hypoglycemia DM2 on insulin --Noted multiple low BG readings.  A1c from sept 2020 and Jan 2021 both wnl.  Pt is on Levemir 10u daily and Metformin at home. --BG now stable after holding insulin and home metformin.  HYPERKALEMIA --Treat with Kayexalate and Lasix.  HYPOMAGENSEMIA replete and recheck  Hx of AFLUTTER/FIB Holding carvedilol and amiodarone Patient on telemetry Monitor for pauses  HTN Hold carvedilol given significant bradycardia  ANXIETY AND DEPRESSION Continue mirtazepine    DVT prophylaxis: Heparin SQ Code Status: DNR  Family Communication: Updated daughter [name] on the phone today. Disposition Plan: Bradycardia leading to hypoperfusion leading to kidney failure leading to pulm congestion.  I am attempting aggressive diuresis.  If no urine output and conditions worsen in 2 days, and pt does not wish for dialysis, then will consult palliative care.     Subjective and Interval History:  Pt appeared more alert today.  Noted to be choking even on thin fluids, so made NPO except for a few ice chips which pt requests frequently.  No fever, N/V/D.  Reported some right-sided pain that granddaughter said was due to the recent fall.  Started making urine after aggressive diuretic.  O2 requirement went down to 2L today.     Objective: Vitals:   10/26/19 0748 10/26/19 1103 10/26/19 1633 10/26/19 1945  BP: (!) 146/67 (!) 119/51 (!) 140/58 (!) 128/54  Pulse: 82 77 70 70  Resp: 16 18 16 16   Temp: 98.2 F (36.8 C) 98.2 F (36.8 C) 98.1 F (36.7 C) (!) 97.4 F (36.3 C)  TempSrc: Oral Oral Oral Oral  SpO2: 97% 98% 96% 100%  Weight:      Height:        Intake/Output Summary (Last 24 hours) at 10/26/2019 2340 Last data filed at 10/26/2019 2224 Gross per 24 hour  Intake 375.41 ml  Output 2150 ml  Net -1774.59  ml   Filed Weights   10/22/19 1121  Weight: 90.7 kg    Examination:   Constitutional: NAD, AAOx3, a little brighter today, clear headed HEENT: conjunctivae and lids normal, EOMI, bump on the top of the scalp, bruising around her eyes improved CV: RRR. Distal pulses +2.  No cyanosis.   RESP: crackles over posterior lungs, normal respiratory effort, on 2L GI: +BS, NTND Extremities: No effusions, edema, or tenderness in BLE SKIN: warm, dry and intact Neuro: II - XII grossly intact.  Sensation intact Psych: Flat mood and affect.     Data Reviewed: I have personally reviewed following labs and imaging studies  CBC: Recent Labs  Lab 10/22/19 1119 10/23/19 0444 10/24/19 0609 10/25/19 0425 10/26/19 0604  WBC 11.2* 7.3 6.7 9.3 8.8  NEUTROABS 7.6  --   --   --   --   HGB 11.5* 10.3* 10.0* 11.4* 11.8*  HCT 36.8 33.4* 31.6* 34.5* 35.6*  MCV 92.9 94.4 91.9 88.9 87.7  PLT 244 201 207 248 622   Basic Metabolic Panel: Recent Labs  Lab 10/22/19 2211 10/22/19 2211 10/23/19 0444 10/24/19 0609 10/25/19 0425 10/26/19 0604 10/26/19 1612  NA 139   < > 139 136 134* 133* 134*  K 6.4*   < > 5.2* 5.5* 5.8* 6.0* 4.7  CL 100   < > 99 92* 89* 86* 87*  CO2 21*   < > 27 29 28 30  32  GLUCOSE 111*   < > 80 120* 164* 144* 136*  BUN 29*   < > 32* 38* 45* 54* 55*  CREATININE 3.86*   < > 4.01* 5.18* 5.76* 6.07* 6.06*  CALCIUM 8.0*   < > 7.9* 7.6* 7.8* 7.7* 7.5*  MG 1.6*  --  2.1 1.9 1.9 1.9  --    < > = values in this interval not displayed.   GFR: Estimated Creatinine Clearance: 6.8 mL/min (A) (by C-G formula based on SCr of 6.06 mg/dL (H)). Liver Function Tests: Recent Labs  Lab 10/23/19 0444  AST 36  ALT 28  ALKPHOS 176*  BILITOT 0.5  PROT 5.6*  ALBUMIN 3.0*  2.7*   No results for input(s): LIPASE, AMYLASE in the last 168 hours. No results for input(s): AMMONIA in the last 168 hours. Coagulation Profile: No results for input(s): INR, PROTIME in the last 168 hours. Cardiac  Enzymes: No results for input(s): CKTOTAL, CKMB, CKMBINDEX, TROPONINI in the last 168 hours. BNP (last 3 results) No results for input(s): PROBNP in the last 8760 hours. HbA1C: No results for input(s): HGBA1C in the last 72 hours. CBG: Recent Labs  Lab 10/23/19 2046 10/24/19 0139 10/24/19 0833 10/25/19 0005 10/25/19 0150  GLUCAP 139* 127* 137* 177* 193*   Lipid Profile: No results for input(s): CHOL, HDL, LDLCALC, TRIG, CHOLHDL, LDLDIRECT in the last 72 hours. Thyroid Function Tests: No results for input(s): TSH, T4TOTAL, FREET4, T3FREE, THYROIDAB in the last 72 hours. Anemia Panel: No results for input(s): VITAMINB12, FOLATE, FERRITIN, TIBC, IRON, RETICCTPCT in the last 72 hours. Sepsis Labs: No results for input(s): PROCALCITON, LATICACIDVEN in the last 168 hours.  Recent Results (from the past 240 hour(s))  SARS CORONAVIRUS  2 (TAT 6-24 HRS) Nasopharyngeal Nasopharyngeal Swab     Status: None   Collection Time: 10/22/19  1:33 PM   Specimen: Nasopharyngeal Swab  Result Value Ref Range Status   SARS Coronavirus 2 NEGATIVE NEGATIVE Final    Comment: (NOTE) SARS-CoV-2 target nucleic acids are NOT DETECTED. The SARS-CoV-2 RNA is generally detectable in upper and lower respiratory specimens during the acute phase of infection. Negative results do not preclude SARS-CoV-2 infection, do not rule out co-infections with other pathogens, and should not be used as the sole basis for treatment or other patient management decisions. Negative results must be combined with clinical observations, patient history, and epidemiological information. The expected result is Negative. Fact Sheet for Patients: SugarRoll.be Fact Sheet for Healthcare Providers: https://www.woods-mathews.com/ This test is not yet approved or cleared by the [place] FDA and  has been authorized for detection and/or diagnosis of SARS-CoV-2 by FDA under an Emergency Use  Authorization (EUA). This EUA will remain  in effect (meaning this test can be used) for the duration of the COVID-19 declaration under Section 56 4(b)(1) of the Act, 21 U.S.C. section 360bbb-3(b)(1), unless the authorization is terminated or revoked sooner. Performed at [facility], [place] [address], [place], [place] [address]   MRSA PCR Screening     Status: None   Collection Time: 10/26/19  6:30 AM   Specimen: Nasopharyngeal  Result Value Ref Range Status   MRSA by PCR NEGATIVE NEGATIVE Final    Comment:        The GeneXpert MRSA Assay (FDA approved for NASAL specimens only), is one component of a comprehensive MRSA colonization surveillance program. It is not intended to diagnose MRSA infection nor to guide or monitor treatment for MRSA infections. Performed at [facility], [address] [place], [place] [address]       Radiology Studies: DG Chest 1 View  Result Date: 10/25/2019 CLINICAL DATA:  Shortness of breath EXAM: CHEST  1 VIEW COMPARISON:  10/22/2019 FINDINGS: Heart size is enlarged. There are worsening interstitial lung markings bilaterally. There is no pneumothorax. There may be small bilateral pleural effusions, right greater than left, new from prior study. There is no acute osseous abnormality. IMPRESSION: Cardiomegaly with findings suggestive of congestive heart failure. A superimposed atypical infectious process cannot be excluded. Electronically Signed   By: Constance Holster M.D.   On: 10/25/2019 02:16     Scheduled Meds: . Chlorhexidine Gluconate Cloth  6 each Topical Daily  . furosemide  80 mg Intravenous Q6H  . heparin  5,000 Units Subcutaneous Q8H  . sodium chloride flush  10 mL Intravenous Q12H  . sodium zirconium cyclosilicate  10 g Oral Daily   Continuous Infusions: . DOPamine 5 mcg/kg/min (10/26/19 2001)     LOS: 4 days     Enzo Bi, MD [facility] If 7PM-7AM, please contact night-coverage 10/26/2019, 11:40 PM

## 2019-10-27 DIAGNOSIS — I4891 Unspecified atrial fibrillation: Secondary | ICD-10-CM

## 2019-10-27 LAB — BASIC METABOLIC PANEL
Anion gap: 23 — ABNORMAL HIGH (ref 5–15)
BUN: 57 mg/dL — ABNORMAL HIGH (ref 8–23)
CO2: 27 mmol/L (ref 22–32)
Calcium: 8 mg/dL — ABNORMAL LOW (ref 8.9–10.3)
Chloride: 84 mmol/L — ABNORMAL LOW (ref 98–111)
Creatinine, Ser: 6.06 mg/dL — ABNORMAL HIGH (ref 0.44–1.00)
GFR calc Af Amer: 7 mL/min — ABNORMAL LOW (ref >60)
GFR calc non Af Amer: 6 mL/min — ABNORMAL LOW (ref >60)
Glucose, Bld: 128 mg/dL — ABNORMAL HIGH (ref 70–99)
Potassium: 4.5 mmol/L (ref 3.5–5.1)
Sodium: 134 mmol/L — ABNORMAL LOW (ref 135–145)

## 2019-10-27 LAB — PROTEIN ELECTRO, RANDOM URINE
Albumin ELP, Urine: 25.4 %
Alpha-1-Globulin, U: 5.7 %
Alpha-2-Globulin, U: 19.9 %
Beta Globulin, U: 37.8 %
Gamma Globulin, U: 11.3 %
Total Protein, Urine: 33.7 mg/dL

## 2019-10-27 LAB — CBC
HCT: 41.7 % (ref 36.0–46.0)
Hemoglobin: 14.1 g/dL (ref 12.0–15.0)
MCH: 29.2 pg (ref 26.0–34.0)
MCHC: 33.8 g/dL (ref 30.0–36.0)
MCV: 86.3 fL (ref 80.0–100.0)
Platelets: 202 10*3/uL (ref 150–400)
RBC: 4.83 MIL/uL (ref 3.87–5.11)
RDW: 14.3 % (ref 11.5–15.5)
WBC: 5.9 10*3/uL (ref 4.0–10.5)
nRBC: 0 % (ref 0.0–0.2)

## 2019-10-27 LAB — MAGNESIUM: Magnesium: 1.5 mg/dL — ABNORMAL LOW (ref 1.7–2.4)

## 2019-10-27 NOTE — Care Management Important Message (Signed)
Important Message  Patient Details  Name: Diane Mullins MRN: [id number] Date of Birth: Aug 04, 1933   Medicare Important Message Given:  Yes     Dannette Barbara 10/27/2019, 11:27 AM

## 2019-10-27 NOTE — Progress Notes (Signed)
Progress Note  Patient Name: Diane Mullins Date of Encounter: 10/27/2019  Primary Cardiologist: Ida Rogue, MD   Subjective   No reported CP. States she feels cold and is frustrated by her NPO status (upcoming procedure?). Feels breathing is about the same. Remains on [place] oxygen.   AMS improving with consideration that hard of hearing. At times, speech garbled.   Inpatient Medications    Scheduled Meds: . Chlorhexidine Gluconate Cloth  6 each Topical Daily  . furosemide  80 mg Intravenous Q6H  . heparin  5,000 Units Subcutaneous Q8H  . sodium chloride flush  10 mL Intravenous Q12H  . sodium zirconium cyclosilicate  10 g Oral Daily   Continuous Infusions: . DOPamine 5 mcg/kg/min (10/27/19 0528)   PRN Meds: acetaminophen, ipratropium-albuterol, polyethylene glycol   Vital Signs    Vitals:   10/26/19 1633 10/26/19 1945 10/27/19 0501 10/27/19 0741  BP: (!) 140/58 (!) 128/54 (!) 154/67 (!) 156/68  Pulse: 70 70 78 75  Resp: 16 16 18 16   Temp: 98.1 F (36.7 C) (!) 97.4 F (36.3 C) 97.7 F (36.5 C) 98.6 F (37 C)  TempSrc: Oral Oral Oral Oral  SpO2: 96% 100% 97% 98%  Weight:   84.6 kg   Height:        Intake/Output Summary (Last 24 hours) at 10/27/2019 0935 Last data filed at 10/27/2019 0528 Gross per 24 hour  Intake 458.32 ml  Output 2800 ml  Net -2341.68 ml   Last 3 Weights 10/27/2019 10/22/2019 10/22/2019  Weight (lbs) 186 lb 9.6 oz 200 lb (No Data)  Weight (kg) 84.641 kg 90.719 kg (No Data)      Telemetry    SR, rates 70s - Personally Reviewed  ECG    No new tracings - Personally Reviewed  Physical Exam   GEN: No acute distress.  Difficulty hearing Neck: JVP ~10-11cm Cardiac: RRR, 2/6 systolic murmur. No rubs, or gallops.  Respiratory: Trace bibasilar crackles. GI: Soft, nontender, non-distended  MS: Moderate nonpitting bilateral edema; No deformity. Neuro:  Nonfocal  Psych: Normal affect   Labs    High Sensitivity Troponin:   Recent Labs  Lab  10/22/19 1119 10/22/19 1333  TROPONINIHS 64* 58*      Cardiac EnzymesNo results for input(s): TROPONINI in the last 168 hours. No results for input(s): TROPIPOC in the last 168 hours.   Chemistry Recent Labs  Lab 10/23/19 0444 10/24/19 0609 10/25/19 0425 10/26/19 0604 10/26/19 1612  NA 139   < > 134* 133* 134*  K 5.2*   < > 5.8* 6.0* 4.7  CL 99   < > 89* 86* 87*  CO2 27   < > 28 30 32  GLUCOSE 80   < > 164* 144* 136*  BUN 32*   < > 45* 54* 55*  CREATININE 4.01*   < > 5.76* 6.07* 6.06*  CALCIUM 7.9*   < > 7.8* 7.7* 7.5*  PROT 5.6*  --   --   --   --   ALBUMIN 3.0*  2.7*  --   --   --   --   AST 36  --   --   --   --   ALT 28  --   --   --   --   ALKPHOS 176*  --   --   --   --   BILITOT 0.5  --   --   --   --   GFRNONAA 10*   < >  6* 6* 6*  GFRAA 11*   < > 7* 7* 7*  ANIONGAP 13   < > 17* 17* 15   < > = values in this interval not displayed.     Hematology Recent Labs  Lab 10/24/19 0609 10/25/19 0425 10/26/19 0604  WBC 6.7 9.3 8.8  RBC 3.44* 3.88 4.06  HGB 10.0* 11.4* 11.8*  HCT 31.6* 34.5* 35.6*  MCV 91.9 88.9 87.7  MCH 29.1 29.4 29.1  MCHC 31.6 33.0 33.1  RDW 15.5 14.7 14.5  PLT 207 248 268    BNPNo results for input(s): BNP, PROBNP in the last 168 hours.   DDimer No results for input(s): DDIMER in the last 168 hours.   Radiology    No results found.  Cardiac Studies   Echo 10/23/19 1. Left ventricular ejection fraction, by visual estimation, is 55 to  60%. The left ventricle has normal function. There is no left ventricular  hypertrophy.  2. The left ventricle has no regional wall motion abnormalities.  3. Global right ventricle has normal systolic function.The right  ventricular size is mildly enlarged. No increase in right ventricular wall  thickness.  4. Left atrial size was severely dilated.  5. Moderately elevated pulmonary artery systolic pressure.  6. The tricuspid regurgitant velocity is 3.45 m/s, and with an assumed  right  atrial pressure of 10 mmHg, the estimated right ventricular systolic  pressure is moderately elevated at 57.7 mmHg.  7. Bradycardia noted, rate 41 bpm   Patient Profile     86 y.o. female with history of Afib/flutter diagnosed in 10/2018 s/p TEE/DCCV in 10/2018 and again in 11/2018 previously on Eliquis though stopped secondary to GI bleed in 11/2018, HFpEF/pulmonary hypertension, atrial tachycardia, DM2, HTN, HLD, prior stroke, vertigo, and recent fall in 09/2019 with gait instability who we are seeing for junctional rhythm in the setting of hyperkalemia with ARF and Coreg/amiodarone usage.   Assessment & Plan    History of Atrial fibrillation/flutter  History of Junctional rhythm, Bradycardia --Admitted 2/2 PCP referral after AMS with noted Afib at the office. In ED, admitted with bradycardic rates in junctional rhythm. Rates improved on dopamine infusion and this AM sinus rhythm and rates in the 70s. Consider junctional rhythm 2/2 medications (Coreg, amiodarone), electrolyte abnormalities/ worsening renal function. --PTA Coreg and amiodarone discontinued at admission. Recommend continue to avoid rate control. Previously evaluated by EP in past for Afib and junctional rhythm with recommendation to avoid amiodarone/BB/CCB. Continue to wean dopamine drip as tolerated given improved rates and BP. --OAC discontinued last year and after her GIB and no current recommendation for restart. She experienced a recent fall in 09/2019 and was already off her [place] at that time. Current risks of restart of Media outweigh those of benefit, including h/o GIB, compromised renal function (low clearance), and recent fall with AMS and pending repeat head CT as outlined below. CHA2DS2VASc score of at least  8 (CHF, hypertension, age x2, DM2, stroke x2, female).   Acute on chronic diastolic heart failure Pulmonary Hypertension --Improving volume status on exam. Previous EF 60 to 65%  55-60% on most recent echo, updated this  admission. IV lasix originally held at admission 2/2 renal status and hypotension. Started on IV lasix over the weekend with increase in urine output yesterday and improving electrolyte status as directly below 2/2 kayexalate.  --Continue IV lasix with titration as needed until euvolemic on exam and dependent on renal function with nephrology following for volume management- still volume up from  baseline on exam but significantly improved from admission. I/Os net -1.2 and -2.3L yesterday. Wt 90.7  84.6kg.  Continue to monitor I's/O's, daily weights.  Daily BMET to monitor renal function and electrolytes. Continue nasal cannula oxygen as needed.   CAD --No reported chest pain. Earlier mildly elevated HS Tn in the setting of her comorbid conditions, downtrending. Low risk stress test 2019. Previously on Eliquis in place of ASA. Off of [place] as above - continue to hold. As recommended by EP, avoid BB. Recommend restart of statin as below. No ACE/ARB/ANRI/MRA 2/2 renal function. No plans for ischemic evaluation at this time.   Hyperkalemia --Improved s/p Kayexalate 2/7 and restart of IV diuresis. Daily BMET. Continue to monitor renal function and electrolytes.  AOCKD --Likely multifactorial in the setting of volume overload and prerenal etiology with bradycardia and hypotension. Followed currently by nephrology with discussion of HD and patient preferring medication management. Continue daily BMET, reordered for today. Baseline Cr 1.9 and elevated to 6.06.  Avoid nephrotoxins. Renally dose medications. No ACE/ARB/ARNI/MRA 2/2 renal function. As above, IV diuresis has been complicated by renal function.  Essential hypertension --Not well controlled. Antihypertensives previously held at admission 2/2 bradycardic rates and soft BP. Likely elevated 2/2 return of normal HR and as on dopamine drip. If BP still not well controlled once weaned off of dopamine, consider start of low dose amlodipine for BP support at  this time with EF nl as above.  Anemia H/o normocytic anemia requiring IV infusions -Previously on Eliquis, discontinued before admission and after GI bleed in 2020. Recent fall 09/2019; however, this was not the time in which [facility] held as outlined in consult note. Daily CBC recommended and reordered. She has a history of normocytic anemia with previous admissions requiring IV infusions. No reported s/sx of acute bleed noted. Consider anemia of chronic dz.  HLD --PTA statin, held at admission. Recheck of liver function this admission shows LFTs wnl. Recommend restart before discharge with consideration of long term goals.  History of Fall --As above. She is noted to have altered mental status as above with recent fall 09/2019. Consider repeat head imaging.   History of stroke --As above, noted to have altered mental status with history of stroke. Not on anticoagulation PTA 2/2 GIB and recent history of fall as well. Admitted in Afib.  DM2 --SSI. Per IM. Last hemoglobin A1c 09/2019 6.2.   Hypothyroidism --TSH 2.881. Continue Synthroid.  For questions or updates, please contact [place] Please consult [email or web address] for contact info under        Signed, Arvil Chaco, PA-C  10/27/2019, 9:35 AM

## 2019-10-27 NOTE — Progress Notes (Signed)
Debby Bud is to be the only visitor from today on. She is patients MPOA

## 2019-10-27 NOTE — Plan of Care (Signed)
Pts creatinine remains elevated Problem: Clinical Measurements: Goal: Diagnostic test results will improve Outcome: Not Progressing   Pt remains npo Problem: Nutrition: Goal: Adequate nutrition will be maintained Outcome: Not Progressing

## 2019-10-27 NOTE — Plan of Care (Signed)
  Problem: Safety: Goal: Ability to remain free from injury will improve Outcome: Progressing   Problem: Skin Integrity: Goal: Risk for impaired skin integrity will decrease Outcome: Progressing   

## 2019-10-27 NOTE — Progress Notes (Signed)
[facility], [place] 10/27/19  Subjective:   Hospital day # 5 More alert today Serum creatinine remains critically elevated Continued on IV dopamine infusion to help with heart rate  Renal: 02/07 0701 - 02/08 0700 In: 458.3 [I.V.:458.3] Out: 2800 [Urine:2800] Lab Results  Component Value Date   CREATININE 6.06 (H) 10/27/2019   CREATININE 6.06 (H) 10/26/2019   CREATININE 6.07 (H) 10/26/2019     Objective:  Vital signs in last 24 hours:  Temp:  [97.4 F (36.3 C)-98.6 F (37 C)] 97.7 F (36.5 C) (02/08 1152) Pulse Rate:  [70-78] 75 (02/08 1152) Resp:  [16-18] 16 (02/08 1152) BP: (128-157)/(54-74) 157/74 (02/08 1152) SpO2:  [96 %-100 %] 98 % (02/08 1152) Weight:  [84.6 kg] 84.6 kg (02/08 0501)  Weight change:  Filed Weights   10/22/19 1121 10/27/19 0501  Weight: 90.7 kg 84.6 kg    Intake/Output:    Intake/Output Summary (Last 24 hours) at 10/27/2019 1524 Last data filed at 10/27/2019 9381 Gross per 24 hour  Intake 458.32 ml  Output 2300 ml  Net -1841.68 ml     Physical Exam: General:  Frail, elderly woman, laying in the bed  HEENT  periorbital ecchymosis bilaterally  Pulm/lungs  coarse breath sounds, [place] O2  CVS/Heart  irregular rhythm  Abdomen:   Soft  Extremities:  + dependent edema  Neurologic:  Alert, responds to voice  Skin:  Warm  foley  Basic Metabolic Panel:  Recent Labs  Lab 10/23/19 0444 10/23/19 0444 10/24/19 0609 10/24/19 0609 10/25/19 0425 10/25/19 0425 10/26/19 0604 10/26/19 1612 10/27/19 1122  NA 139   < > 136  --  134*  --  133* 134* 134*  K 5.2*   < > 5.5*  --  5.8*  --  6.0* 4.7 4.5  CL 99   < > 92*  --  89*  --  86* 87* 84*  CO2 27   < > 29  --  28  --  30 32 27  GLUCOSE 80   < > 120*  --  164*  --  144* 136* 128*  BUN 32*   < > 38*  --  45*  --  54* 55* 57*  CREATININE 4.01*   < > 5.18*  --  5.76*  --  6.07* 6.06* 6.06*  CALCIUM 7.9*   < > 7.6*   < > 7.8*   < > 7.7* 7.5* 8.0*  MG 2.1  --  1.9   --  1.9  --  1.9  --  1.5*   < > = values in this interval not displayed.     CBC: Recent Labs  Lab 10/22/19 1119 10/22/19 1119 10/23/19 0444 10/24/19 0609 10/25/19 0425 10/26/19 0604 10/27/19 1122  WBC 11.2*   < > 7.3 6.7 9.3 8.8 5.9  NEUTROABS 7.6  --   --   --   --   --   --   HGB 11.5*   < > 10.3* 10.0* 11.4* 11.8* 14.1  HCT 36.8   < > 33.4* 31.6* 34.5* 35.6* 41.7  MCV 92.9   < > 94.4 91.9 88.9 87.7 86.3  PLT 244   < > 201 207 248 268 202   < > = values in this interval not displayed.      Lab Results  Component Value Date   HEPBSAG NON REACTIVE 10/23/2019   HEPBSAB NON REACTIVE 10/23/2019   HEPBIGM NON REACTIVE 10/23/2019      Microbiology:  Recent Results (from the past 240 hour(s))  SARS CORONAVIRUS 2 (TAT 6-24 HRS) Nasopharyngeal Nasopharyngeal Swab     Status: None   Collection Time: 10/22/19  1:33 PM   Specimen: Nasopharyngeal Swab  Result Value Ref Range Status   SARS Coronavirus 2 NEGATIVE NEGATIVE Final    Comment: (NOTE) SARS-CoV-2 target nucleic acids are NOT DETECTED. The SARS-CoV-2 RNA is generally detectable in upper and lower respiratory specimens during the acute phase of infection. Negative results do not preclude SARS-CoV-2 infection, do not rule out co-infections with other pathogens, and should not be used as the sole basis for treatment or other patient management decisions. Negative results must be combined with clinical observations, patient history, and epidemiological information. The expected result is Negative. Fact Sheet for Patients: SugarRoll.be Fact Sheet for Healthcare Providers: https://www.woods-mathews.com/ This test is not yet approved or cleared by the [place] FDA and  has been authorized for detection and/or diagnosis of SARS-CoV-2 by FDA under an Emergency Use Authorization (EUA). This EUA will remain  in effect (meaning this test can be used) for the duration of  the COVID-19 declaration under Section 56 4(b)(1) of the Act, 21 U.S.C. section 360bbb-3(b)(1), unless the authorization is terminated or revoked sooner. Performed at [facility], [place] [address], [place], [place] [address]   MRSA PCR Screening     Status: None   Collection Time: 10/26/19  6:30 AM   Specimen: Nasopharyngeal  Result Value Ref Range Status   MRSA by PCR NEGATIVE NEGATIVE Final    Comment:        The GeneXpert MRSA Assay (FDA approved for NASAL specimens only), is one component of a comprehensive MRSA colonization surveillance program. It is not intended to diagnose MRSA infection nor to guide or monitor treatment for MRSA infections. Performed at [facility], [address] [place], [place] [address]     Coagulation Studies: No results for input(s): LABPROT, INR in the last 72 hours.  Urinalysis: Recent Labs    10/25/19 1257  COLORURINE YELLOW*  LABSPEC 1.008  PHURINE 6.0  GLUCOSEU NEGATIVE  HGBUR SMALL*  BILIRUBINUR NEGATIVE  KETONESUR NEGATIVE  PROTEINUR NEGATIVE  NITRITE NEGATIVE  LEUKOCYTESUR MODERATE*      Imaging: No results found.   Medications:   . DOPamine 5 mcg/kg/min (10/27/19 0528)   . Chlorhexidine Gluconate Cloth  6 each Topical Daily  . furosemide  80 mg Intravenous Q6H  . heparin  5,000 Units Subcutaneous Q8H  . sodium chloride flush  10 mL Intravenous Q12H  . sodium zirconium cyclosilicate  10 g Oral Daily   acetaminophen, ipratropium-albuterol, polyethylene glycol  Assessment/ Plan:  86 y.o. female with multiple complex medical problems including diabetes type 2, insulin-dependent, hypertension, hypothyroidism, hyperlipidemia, congestive heart failure, pulmonary hypertension, history of stroke, atrial fibrillation  admitted on 10/22/2019 for Acute kidney failure, unspecified ([place]) [N17.9] Hyperkalemia [E87.5] Bradycardia [R00.1] Atrial fibrillation, unspecified type ([place]) [I48.91]  #.  Acute renal  failure on chronic kidney disease stage IIIb Baseline creatinine of 1.18/GFR 43 on August 06, 2019 Underlying CKD likely secondary to diabetic nephropathy AKI likely secondary to cardiorenal syndrome from poor perfusion related to cardiac arrhythmias  Patient is responding some to IV furosemide infusion with increased urine output however her BUN, creatinine are critically elevated.  Potassium has improved with diuresis and Lokelma Overall patient's prognosis is poor.  Patient did not want aggressive treatment such as dialysis as per discussion with her granddaughter, Mendel Ryder Conservative management for now Continue IV  Lasix for volume management Agree with family for nondialytic management If renal function continues to worsen, consider hospice  #Hyperkalemia Lokelma IV furosemide  # pulmonary hypertension, atrial fibrillation, coronary disease 2D echo from February 4: LVEF 55 to 60%, severely dilated left atrium, moderately elevated pulmonary pressure-57 mm, Complex cardiac physiology Managed with IV Lasix and IV dopamine Can consider changing IV furosemide to oral torsemide Cardiology team is following    LOS: [place] 2/8/20213:24 PM  [place], [place]  Note: This note was prepared with Dragon dictation. Any transcription errors are unintentional

## 2019-10-27 NOTE — Progress Notes (Signed)
Pt appears to stare off to the right upper wall when talking with her, she is not easily reoriented, md notified.

## 2019-10-27 NOTE — Progress Notes (Signed)
PROGRESS NOTE    Diane Mullins  UTM:[id number] DOB: [birth year] DOA: 10/22/2019 PCP: [name], MD    Assessment & Plan:   Principal Problem:   Bradycardia Active Problems:   Hypertension   Diabetes ([place])   (HFpEF) heart failure with preserved ejection fraction [facility])   Atrial flutter ([place])   Fall   Atrial fibrillation [facility])    Diane Mullins is an 86 y.o. Caucasian female with past medical history significant for atrial fibrillation/flutter, s/p TEE/DCCV (2/4/2020and again November 26, 5679, diastolic heart failure, pulmonary hypertension, diabetes type 2, hypertension, prior stroke/TIA, GI bleed March 2020 , recent fall October 13, 2019 gait instability presenting to the hospital with weakness, bradycardia.   Acute hypoxic respiratory failure 2/2 fluid overload --CXR showed "Cardiomegaly with findings suggestive of congestive heart failure."  Pt received MIVF but had been anuric.   --O2 requirement peaked at 4L, currently sat stable in high 90's. --lasix gtt d/c'ed since it did not appear to be useful (urine output stopped about 6 hours of IV lasix 80 mg). PLAN: --continue IV lasix 80 mg q6h scheduled --Strict I/O (didn't re-order Foley since it was removed twice already overnight) --continue suppl O2 to maintain sat >=92% --Keep on cont pulse ox  BRADYCARDIA In the setting of hyperkalemia, acute renal failure, also on beta-blocker with amiodarone at home.  Beta-blocker amiodarone held since admission.   --HR has been in 70's with dopamine@5 , with improved BP. PLAN: --needs 2-1/2-day washout, and probably more since kidney function severely reduced --No indication for pacing at this time.   --continue dopamine infusion @5  via peripheral IV  AKI likely ATN from hypoperfusion and cardiorenal syndrome --Minimum urine output since presentation, but started to have good urine output after starting diuretic.  Cr stabilized at 6. --Foley cath removed overnight due to pt's  discomfort. --Nephrology consulted PLAN: --continue IV lasix 80 mg q6h scheduled --Strict I/O (didn't re-order Foley since it was removed twice already overnight) --Family is leaning towards no dialysis, per pt's wish  Hypoglycemia DM2 on insulin --Noted multiple low BG readings.  A1c from sept 2020 and Jan 2021 both wnl.  Pt is on Levemir 10u daily and Metformin at home. --BG now stable after holding insulin and home metformin.  HYPERKALEMIA --Treat with Kayexalate and Lasix.  HYPOMAGENSEMIA replete and recheck  Hx of AFLUTTER/FIB Holding carvedilol and amiodarone Patient on telemetry Monitor for pauses  HTN Hold carvedilol given significant bradycardia  ANXIETY AND DEPRESSION Continue mirtazepine    DVT prophylaxis: Heparin SQ Code Status: DNR  Family Communication: Updated daughter [name] at the bedside Disposition Plan: Bradycardia leading to hypoperfusion leading to kidney failure leading to pulm congestion.  I am attempting aggressive diuresis, and pt is responding with good urine output.  If kidney function doesn't recover significantly and conditions worsen and pt does not wish for dialysis, then will consult palliative care.     Subjective and Interval History:  Pt is getting less interactive, likely developing hypoactive delirium.  No noted fever, N/V/D.  Urine output has been good with aggressive diuresis.  Tolerating ice chips.       Objective: Vitals:   10/27/19 0741 10/27/19 1152 10/27/19 1639 10/27/19 1917  BP: (!) 156/68 (!) 157/74 (!) 144/62 (!) 133/49  Pulse: 75 75 83 76  Resp: 16 16 16    Temp: 98.6 F (37 C) 97.7 F (36.5 C) 97.9 F (36.6 C) 97.9 F (36.6 C)  TempSrc: Oral Oral Oral Oral  SpO2: 98% 98% 95%  94%  Weight:      Height:        Intake/Output Summary (Last 24 hours) at 10/27/2019 1932 Last data filed at 10/27/2019 1730 Gross per 24 hour  Intake 196.96 ml  Output 3200 ml  Net -3003.04 ml   Filed Weights   10/22/19 1121  10/27/19 0501  Weight: 90.7 kg 84.6 kg    Examination:   Constitutional: NAD, awake, but not interactive HEENT: conjunctivae and lids normal, EOMI, bump on the top of the scalp, bruising around her eyes covered by makeup CV: RRR. Distal pulses +2.  No cyanosis.   RESP: crackles over posterior lungs, normal respiratory effort, on 2L GI: +BS, NTND Extremities: No effusions, edema, or tenderness in BLE SKIN: warm, dry and intact Neuro: II - XII grossly intact.  Sensation intact Psych: Flat mood and affect.     Data Reviewed: I have personally reviewed following labs and imaging studies  CBC: Recent Labs  Lab 10/22/19 1119 10/22/19 1119 10/23/19 0444 10/24/19 0609 10/25/19 0425 10/26/19 0604 10/27/19 1122  WBC 11.2*   < > 7.3 6.7 9.3 8.8 5.9  NEUTROABS 7.6  --   --   --   --   --   --   HGB 11.5*   < > 10.3* 10.0* 11.4* 11.8* 14.1  HCT 36.8   < > 33.4* 31.6* 34.5* 35.6* 41.7  MCV 92.9   < > 94.4 91.9 88.9 87.7 86.3  PLT 244   < > 201 207 248 268 202   < > = values in this interval not displayed.   Basic Metabolic Panel: Recent Labs  Lab 10/23/19 0444 10/23/19 0444 10/24/19 0609 10/25/19 0425 10/26/19 0604 10/26/19 1612 10/27/19 1122  NA 139   < > 136 134* 133* 134* 134*  K 5.2*   < > 5.5* 5.8* 6.0* 4.7 4.5  CL 99   < > 92* 89* 86* 87* 84*  CO2 27   < > 29 28 30  32 27  GLUCOSE 80   < > 120* 164* 144* 136* 128*  BUN 32*   < > 38* 45* 54* 55* 57*  CREATININE 4.01*   < > 5.18* 5.76* 6.07* 6.06* 6.06*  CALCIUM 7.9*   < > 7.6* 7.8* 7.7* 7.5* 8.0*  MG 2.1  --  1.9 1.9 1.9  --  1.5*   < > = values in this interval not displayed.   GFR: Estimated Creatinine Clearance: 6.5 mL/min (A) (by C-G formula based on SCr of 6.06 mg/dL (H)). Liver Function Tests: Recent Labs  Lab 10/23/19 0444  AST 36  ALT 28  ALKPHOS 176*  BILITOT 0.5  PROT 5.6*  ALBUMIN 3.0*  2.7*   No results for input(s): LIPASE, AMYLASE in the last 168 hours. No results for input(s): AMMONIA in  the last 168 hours. Coagulation Profile: No results for input(s): INR, PROTIME in the last 168 hours. Cardiac Enzymes: No results for input(s): CKTOTAL, CKMB, CKMBINDEX, TROPONINI in the last 168 hours. BNP (last 3 results) No results for input(s): PROBNP in the last 8760 hours. HbA1C: No results for input(s): HGBA1C in the last 72 hours. CBG: Recent Labs  Lab 10/23/19 2046 10/24/19 0139 10/24/19 0833 10/25/19 0005 10/25/19 0150  GLUCAP 139* 127* 137* 177* 193*   Lipid Profile: No results for input(s): CHOL, HDL, LDLCALC, TRIG, CHOLHDL, LDLDIRECT in the last 72 hours. Thyroid Function Tests: No results for input(s): TSH, T4TOTAL, FREET4, T3FREE, THYROIDAB in the last 72 hours. Anemia Panel:  No results for input(s): VITAMINB12, FOLATE, FERRITIN, TIBC, IRON, RETICCTPCT in the last 72 hours. Sepsis Labs: No results for input(s): PROCALCITON, LATICACIDVEN in the last 168 hours.  Recent Results (from the past 240 hour(s))  SARS CORONAVIRUS 2 (TAT 6-24 HRS) Nasopharyngeal Nasopharyngeal Swab     Status: None   Collection Time: 10/22/19  1:33 PM   Specimen: Nasopharyngeal Swab  Result Value Ref Range Status   SARS Coronavirus 2 NEGATIVE NEGATIVE Final    Comment: (NOTE) SARS-CoV-2 target nucleic acids are NOT DETECTED. The SARS-CoV-2 RNA is generally detectable in upper and lower respiratory specimens during the acute phase of infection. Negative results do not preclude SARS-CoV-2 infection, do not rule out co-infections with other pathogens, and should not be used as the sole basis for treatment or other patient management decisions. Negative results must be combined with clinical observations, patient history, and epidemiological information. The expected result is Negative. Fact Sheet for Patients: SugarRoll.be Fact Sheet for Healthcare Providers: https://www.woods-mathews.com/ This test is not yet approved or cleared by the [place] FDA and  has been authorized for detection and/or diagnosis of SARS-CoV-2 by FDA under an Emergency Use Authorization (EUA). This EUA will remain  in effect (meaning this test can be used) for the duration of the COVID-19 declaration under Section 56 4(b)(1) of the Act, 21 U.S.C. section 360bbb-3(b)(1), unless the authorization is terminated or revoked sooner. Performed at [facility], [place] [address], [place], [place] [address]   MRSA PCR Screening     Status: None   Collection Time: 10/26/19  6:30 AM   Specimen: Nasopharyngeal  Result Value Ref Range Status   MRSA by PCR NEGATIVE NEGATIVE Final    Comment:        The GeneXpert MRSA Assay (FDA approved for NASAL specimens only), is one component of a comprehensive MRSA colonization surveillance program. It is not intended to diagnose MRSA infection nor to guide or monitor treatment for MRSA infections. Performed at [facility], [address] [place], [place] [address]       Radiology Studies: No results found.   Scheduled Meds: . Chlorhexidine Gluconate Cloth  6 each Topical Daily  . furosemide  80 mg Intravenous Q6H  . heparin  5,000 Units Subcutaneous Q8H  . sodium chloride flush  10 mL Intravenous Q12H  . sodium zirconium cyclosilicate  10 g Oral Daily   Continuous Infusions: . DOPamine 5 mcg/kg/min (10/27/19 1700)     LOS: 5 days     Enzo Bi, MD [facility] If 7PM-7AM, please contact night-coverage 10/27/2019, 7:32 PM

## 2019-10-27 NOTE — Plan of Care (Signed)
  Problem: Education: Goal: Knowledge of General Education information will improve Description Including pain rating scale, medication(s)/side effects and non-pharmacologic comfort measures Outcome: Progressing   Problem: Activity: Goal: Risk for activity intolerance will decrease Outcome: Progressing   Problem: Safety: Goal: Ability to remain free from injury will improve Outcome: Progressing   

## 2019-10-27 NOTE — Progress Notes (Signed)
Physical Therapy Treatment Patient Details Name: Diane Mullins MRN: [id number] DOB: March 21, 1933 Today's Date: 10/27/2019    History of Present Illness 86 y.o. female with multiple complex medical problems including diabetes type 2, insulin-dependent, hypertension, hypothyroidism, hyperlipidemia, congestive heart failure, pulmonary hypertension, history of stroke, atrial fibrillation  admitted on 10/22/2019 for AKI, A-fib.    PT Comments    Patient presented with a flat affect and was lethargic throughout the session. Patient did not answer or respond to some questions but was able to recall her name and DOB. Pt demonstrated understanding of instructions consistently throughout the session when engaging in exercises and bed mobility. While pt was able to complete some exercises without assist, many basic in-bed strengthening exercise such as SLR, hip abd/add, and heel slides required assistance to go through a small ROM. Pt required max assist x2 to sit EOB and reported dizziness upon sitting upright. Pt was returned to supine and subjective reports of dizziness stopped. Pt was found on 1LO2/min and consistently had an oxygen saturation between 94% and 95%. Nursing notified in regards to dizziness and O2 sat. Pt will benefit from PT services in a SNF setting upon discharge to safely address deficits listed in patient problem list for decreased caregiver assistance and eventual return to PLOF.    Follow Up Recommendations  SNF     Equipment Recommendations  None recommended by PT    Recommendations for Other Services       Precautions / Restrictions Precautions Precautions: Fall Restrictions Weight Bearing Restrictions: No    Mobility  Bed Mobility Overal bed mobility: Needs Assistance Bed Mobility: Rolling;Sidelying to Sit;Supine to Sit Rolling: +2 for physical assistance;Max assist Sidelying to sit: Max assist;+2 for physical assistance Supine to sit: Max assist;+2 for physical  assistance     General bed mobility comments: pt showed understanding of movement patterns needed for bed mobility and was able to help a minimal amount/ pt became dizzy is sitting and was returned to supine, nsg notified  Transfers                    Ambulation/Gait                 Stairs             Wheelchair Mobility    Modified Rankin (Stroke Patients Only)       Balance Overall balance assessment: Needs assistance Sitting-balance support: Feet supported;Single extremity supported Sitting balance-Leahy Scale: Poor   Postural control: Right lateral lean                                  Cognition Arousal/Alertness: Lethargic Behavior During Therapy: Flat affect Overall Cognitive Status: Difficult to assess                                 General Comments: pt is a poor historian and was not very responsive to questions regarding her current status      Exercises Total Joint Exercises Ankle Circles/Pumps: AROM;Strengthening;Both;10 reps Quad Sets: Strengthening;Both;10 reps Gluteal Sets: Strengthening;Both;15 reps Heel Slides: AAROM;Both;10 reps;Strengthening Hip ABduction/ADduction: AAROM;Strengthening;Both;10 reps Straight Leg Raises: AAROM;Strengthening;Both;15 reps Other Exercises Other Exercises: Static sitting balance EOB with bilateral UE support Other Exercises: log roll training for decreased care giver assistance    General Comments        Pertinent Vitals/Pain  Pain Assessment: No/denies pain    Home Living                      Prior Function            PT Goals (current goals can now be found in the care plan section) Progress towards PT goals: Progressing toward goals    Frequency    Min 2X/week      PT Plan Current plan remains appropriate    Co-evaluation              AM-PAC PT "6 Clicks" Mobility   Outcome Measure  Help needed turning from your back to your  side while in a flat bed without using bedrails?: Total Help needed moving from lying on your back to sitting on the side of a flat bed without using bedrails?: Total Help needed moving to and from a bed to a chair (including a wheelchair)?: Total Help needed standing up from a chair using your arms (e.g., wheelchair or bedside chair)?: Total Help needed to walk in hospital room?: Total Help needed climbing 3-5 steps with a railing? : Total 6 Click Score: 6    End of Session Equipment Utilized During Treatment: Gait belt;Oxygen Activity Tolerance: Patient limited by lethargy Patient left: in bed;with bed alarm set;with call bell/phone within reach Nurse Communication: Mobility status;Other (comment)(nsg notified in regards to dizzy-repsonse in sitting. nsg notified about oxygen sat throughout session) PT Visit Diagnosis: Muscle weakness (generalized) (M62.81)     Time: 2620-3559 PT Time Calculation (min) (ACUTE ONLY): 26 min  Charges:                        Annabelle Harman, SPT 10/27/19 3:53 PM

## 2019-10-28 LAB — CBC
HCT: 38 % (ref 36.0–46.0)
Hemoglobin: 12.5 g/dL (ref 12.0–15.0)
MCH: 28.5 pg (ref 26.0–34.0)
MCHC: 32.9 g/dL (ref 30.0–36.0)
MCV: 86.6 fL (ref 80.0–100.0)
Platelets: 277 10*3/uL (ref 150–400)
RBC: 4.39 MIL/uL (ref 3.87–5.11)
RDW: 14 % (ref 11.5–15.5)
WBC: 6.3 10*3/uL (ref 4.0–10.5)
nRBC: 0 % (ref 0.0–0.2)

## 2019-10-28 LAB — BASIC METABOLIC PANEL
Anion gap: 21 — ABNORMAL HIGH (ref 5–15)
BUN: 60 mg/dL — ABNORMAL HIGH (ref 8–23)
CO2: 32 mmol/L (ref 22–32)
Calcium: 8 mg/dL — ABNORMAL LOW (ref 8.9–10.3)
Chloride: 82 mmol/L — ABNORMAL LOW (ref 98–111)
Creatinine, Ser: 6.12 mg/dL — ABNORMAL HIGH (ref 0.44–1.00)
GFR calc Af Amer: 7 mL/min — ABNORMAL LOW (ref >60)
GFR calc non Af Amer: 6 mL/min — ABNORMAL LOW (ref >60)
Glucose, Bld: 127 mg/dL — ABNORMAL HIGH (ref 70–99)
Potassium: 4.1 mmol/L (ref 3.5–5.1)
Sodium: 135 mmol/L (ref 135–145)

## 2019-10-28 LAB — MAGNESIUM: Magnesium: 1.7 mg/dL (ref 1.7–2.4)

## 2019-10-28 MED ORDER — MAGNESIUM SULFATE 2 GM/50ML IV SOLN
2.0000 g | Freq: Once | INTRAVENOUS | Status: AC
  Administered 2019-10-28: 2 g via INTRAVENOUS
  Filled 2019-10-28: qty 50

## 2019-10-28 MED ORDER — NEPRO/CARBSTEADY PO LIQD
237.0000 mL | Freq: Two times a day (BID) | ORAL | Status: DC
  Administered 2019-10-28: 237 mL via ORAL

## 2019-10-28 NOTE — Progress Notes (Addendum)
PROGRESS NOTE    SOPHIEA UEDA  IOE:[id number] DOB: [birth year] DOA: 10/22/2019 PCP: [name], MD    Assessment & Plan:   Principal Problem:   Bradycardia Active Problems:   Hypertension   Diabetes ([place])   (HFpEF) heart failure with preserved ejection fraction [facility])   Atrial flutter ([place])   Fall   Atrial fibrillation [facility])    KARINDA CABRIALES is an 86 y.o. Caucasian female with past medical history significant for atrial fibrillation/flutter, s/p TEE/DCCV (2/4/2020and again November 26, 1827, diastolic heart failure, pulmonary hypertension, diabetes type 2, hypertension, prior stroke/TIA, GI bleed March 2020 , recent fall October 13, 2019 gait instability presenting to the hospital with weakness, bradycardia.   Acute hypoxic respiratory failure 2/2 fluid overload, improved --CXR showed "Cardiomegaly with findings suggestive of congestive heart failure."   --O2 requirement peaked at 4L, currently sat stable in high 90's on 1L. --lasix gtt d/c'ed since it did not appear to be useful (urine output stopped about 6 hours of IV lasix 80 mg).  Was on IV lasix 80 mg BID to TID for 3 days from 2/6 to 2/8, with good response in urine output.   PLAN: --Hold lasix and monitor urine output --Strict I/O (didn't re-order Foley since it was removed twice already overnight) --continue suppl O2 to maintain sat >=92% --Keep on cont pulse ox  BRADYCARDIA In the setting of hyperkalemia, acute renal failure, also on beta-blocker with amiodarone at home.  Beta-blocker amiodarone held since admission.   --HR has been in 70's with dopamine@5 , with improved BP. PLAN: --needs 2-1/2-day washout, and probably more since kidney function severely reduced --No indication for pacing at this time.   --dopamine infusion d/c'ed by cards since pt is no longer in junctional rhythm.  AKI likely ATN from hypoperfusion and cardiorenal syndrome --Minimum urine output since presentation, but started to have good  urine output after starting diuretic.  Cr stabilized at 6.  Electrolytes have been wnl. --Nephrology consulted PLAN: --Hold diuretic now and monitor for urine output --Strict I/O (didn't re-order Foley since it was removed twice already overnight) --No dialysis, per pt's wish  Hypoglycemia DM2 on insulin --Noted multiple low BG readings.  A1c from sept 2020 and Jan 2021 both wnl.  Pt is on Levemir 10u daily and Metformin at home. --BG now stable after holding insulin and home metformin.  HYPERKALEMIA, resolved --Treated with Kayexalate and Lasix.  HYPOMAGENSEMIA replete and recheck  Hx of AFLUTTER/FIB Holding carvedilol and amiodarone Patient on telemetry Monitor for pauses  HTN Hold carvedilol given significant bradycardia  ANXIETY AND DEPRESSION Continue mirtazepine    DVT prophylaxis: Heparin SQ Code Status: DNR  Family Communication: Updated daughter [name] Risk analyst) and granddaughter Mendel Ryder.  Disposition Plan/MDM: Bradycardia leading to hypoperfusion leading to kidney failure leading to pulm congestion.  With aggressive diuresis, and pt is responding with good urine output, and respiratory status improved with relief of congestion.    I have had lots discussions every day with multiple family members and pt's nurses, case mangers, support staff about goals of care, which has been, medical treatment and if not improving and dying, then palliative consult to facilitate transfer to hospice.  Not clear cut decision for comfort care currently because even though pt had severe AKI with Cr up to 6, pt currently is maintaining stable electrolytes and urine output for the last 24 hours without Lasix.  Currently does not require dialysis.  Pt however is developing hypoactive delirium, which is expected.  Both family members and medical staff asked me today for a decision to move towards palliative/comfort care.  Given pt's age, multiple severe medical conditions and  eventuality of needing dialysis (which has been declined), it is reasonable to stop medical treatment now and initiate transfer to hospice facility.  I told pt's daughter that if by chance pt's kidney function recovers, pt can always leave hospice later.    Palliative consult placed late in the day.  Pt will be seen tomorrow and start referral to hospice facility.     Subjective and Interval History:  Pt was more alert and talking with family members earlier in the morning, but became blank later.  O2 requirement decreased.  Continue to have urine output on her own without further diuretic.  Electrolytes wnl.  Nursing helped pt try thickened liquid, and pt tolerated it better, only coughed once.  Pt put back on dysphagia thickened liquid diet.     Objective: Vitals:   10/28/19 1133 10/28/19 1231 10/28/19 1702 10/28/19 1937  BP: (!) 90/50 (!) 107/53 (!) 110/55 (!) 132/54  Pulse: 69 69 71 66  Resp: 17  19 16   Temp: 98.4 F (36.9 C)  98.4 F (36.9 C) 97.7 F (36.5 C)  TempSrc: Oral  Oral Oral  SpO2: 97%  99% 96%  Weight:      Height:        Intake/Output Summary (Last 24 hours) at 10/28/2019 2047 Last data filed at 10/28/2019 1847 Gross per 24 hour  Intake 98.45 ml  Output 1700 ml  Net -1601.55 ml   Filed Weights   10/22/19 1121 10/27/19 0501  Weight: 90.7 kg 84.6 kg    Examination:   Constitutional: NAD, awake, but not interactive HEENT: conjunctivae and lids normal, EOMI, bump on the top of the scalp, bruising around her eyes covered by makeup CV: RRR. Distal pulses +2.  No cyanosis.   RESP: crackles over posterior lungs, normal respiratory effort, on 1L GI: +BS, NTND Extremities: No effusions, edema, or tenderness in BLE SKIN: warm, dry and intact Neuro: II - XII grossly intact.  Sensation intact   Data Reviewed: I have personally reviewed following labs and imaging studies  CBC: Recent Labs  Lab 10/22/19 1119 10/23/19 0444 10/24/19 0609 10/25/19 0425  10/26/19 0604 10/27/19 1122 10/28/19 0647  WBC 11.2*   < > 6.7 9.3 8.8 5.9 6.3  NEUTROABS 7.6  --   --   --   --   --   --   HGB 11.5*   < > 10.0* 11.4* 11.8* 14.1 12.5  HCT 36.8   < > 31.6* 34.5* 35.6* 41.7 38.0  MCV 92.9   < > 91.9 88.9 87.7 86.3 86.6  PLT 244   < > 207 248 268 202 277   < > = values in this interval not displayed.   Basic Metabolic Panel: Recent Labs  Lab 10/24/19 0609 10/24/19 0609 10/25/19 0425 10/26/19 0604 10/26/19 1612 10/27/19 1122 10/28/19 0647  NA 136   < > 134* 133* 134* 134* 135  K 5.5*   < > 5.8* 6.0* 4.7 4.5 4.1  CL 92*   < > 89* 86* 87* 84* 82*  CO2 29   < > 28 30 32 27 32  GLUCOSE 120*   < > 164* 144* 136* 128* 127*  BUN 38*   < > 45* 54* 55* 57* 60*  CREATININE 5.18*   < > 5.76* 6.07* 6.06* 6.06* 6.12*  CALCIUM 7.6*   < >  7.8* 7.7* 7.5* 8.0* 8.0*  MG 1.9  --  1.9 1.9  --  1.5* 1.7   < > = values in this interval not displayed.   GFR: Estimated Creatinine Clearance: 6.5 mL/min (A) (by C-G formula based on SCr of 6.12 mg/dL (H)). Liver Function Tests: Recent Labs  Lab 10/23/19 0444  AST 36  ALT 28  ALKPHOS 176*  BILITOT 0.5  PROT 5.6*  ALBUMIN 3.0*  2.7*   No results for input(s): LIPASE, AMYLASE in the last 168 hours. No results for input(s): AMMONIA in the last 168 hours. Coagulation Profile: No results for input(s): INR, PROTIME in the last 168 hours. Cardiac Enzymes: No results for input(s): CKTOTAL, CKMB, CKMBINDEX, TROPONINI in the last 168 hours. BNP (last 3 results) No results for input(s): PROBNP in the last 8760 hours. HbA1C: No results for input(s): HGBA1C in the last 72 hours. CBG: Recent Labs  Lab 10/23/19 2046 10/24/19 0139 10/24/19 0833 10/25/19 0005 10/25/19 0150  GLUCAP 139* 127* 137* 177* 193*   Lipid Profile: No results for input(s): CHOL, HDL, LDLCALC, TRIG, CHOLHDL, LDLDIRECT in the last 72 hours. Thyroid Function Tests: No results for input(s): TSH, T4TOTAL, FREET4, T3FREE, THYROIDAB in the  last 72 hours. Anemia Panel: No results for input(s): VITAMINB12, FOLATE, FERRITIN, TIBC, IRON, RETICCTPCT in the last 72 hours. Sepsis Labs: No results for input(s): PROCALCITON, LATICACIDVEN in the last 168 hours.  Recent Results (from the past 240 hour(s))  SARS CORONAVIRUS 2 (TAT 6-24 HRS) Nasopharyngeal Nasopharyngeal Swab     Status: None   Collection Time: 10/22/19  1:33 PM   Specimen: Nasopharyngeal Swab  Result Value Ref Range Status   SARS Coronavirus 2 NEGATIVE NEGATIVE Final    Comment: (NOTE) SARS-CoV-2 target nucleic acids are NOT DETECTED. The SARS-CoV-2 RNA is generally detectable in upper and lower respiratory specimens during the acute phase of infection. Negative results do not preclude SARS-CoV-2 infection, do not rule out co-infections with other pathogens, and should not be used as the sole basis for treatment or other patient management decisions. Negative results must be combined with clinical observations, patient history, and epidemiological information. The expected result is Negative. Fact Sheet for Patients: SugarRoll.be Fact Sheet for Healthcare Providers: https://www.woods-mathews.com/ This test is not yet approved or cleared by the [place] FDA and  has been authorized for detection and/or diagnosis of SARS-CoV-2 by FDA under an Emergency Use Authorization (EUA). This EUA will remain  in effect (meaning this test can be used) for the duration of the COVID-19 declaration under Section 56 4(b)(1) of the Act, 21 U.S.C. section 360bbb-3(b)(1), unless the authorization is terminated or revoked sooner. Performed at [facility], [place] [address], [place], [place] [address]   MRSA PCR Screening     Status: None   Collection Time: 10/26/19  6:30 AM   Specimen: Nasopharyngeal  Result Value Ref Range Status   MRSA by PCR NEGATIVE NEGATIVE Final    Comment:        The GeneXpert MRSA Assay (FDA approved  for NASAL specimens only), is one component of a comprehensive MRSA colonization surveillance program. It is not intended to diagnose MRSA infection nor to guide or monitor treatment for MRSA infections. Performed at [facility], [address] [place], [place] [address]       Radiology Studies: No results found.   Scheduled Meds: . feeding supplement (NEPRO CARB STEADY)  237 mL Oral BID BM  . heparin  5,000 Units Subcutaneous Q8H  .  sodium chloride flush  10 mL Intravenous Q12H   Continuous Infusions:    LOS: 6 days     Enzo Bi, MD [facility] If 7PM-7AM, please contact night-coverage 10/28/2019, 8:47 PM

## 2019-10-28 NOTE — Progress Notes (Signed)
[place], [place] 10/28/19  Subjective:   Hospital day # 6 Dopamine was discontinued today Patient is hard of hearing, in and out of consciousness.  Responded only to few simple questions  Renal: 02/08 0701 - 02/09 0700 In: 186.5 [I.V.:186.5] Out: 2700 [Urine:2700] Lab Results  Component Value Date   CREATININE 6.12 (H) 10/28/2019   CREATININE 6.06 (H) 10/27/2019   CREATININE 6.06 (H) 10/26/2019     Objective:  Vital signs in last 24 hours:  Temp:  [97.7 F (36.5 C)-98.4 F (36.9 C)] 98.4 F (36.9 C) (02/09 1133) Pulse Rate:  [69-83] 69 (02/09 1231) Resp:  [16-20] 17 (02/09 1133) BP: (90-144)/(49-62) 107/53 (02/09 1231) SpO2:  [94 %-97 %] 97 % (02/09 1133)  Weight change:  Filed Weights   10/22/19 1121 10/27/19 0501  Weight: 90.7 kg 84.6 kg    Intake/Output:    Intake/Output Summary (Last 24 hours) at 10/28/2019 1416 Last data filed at 10/28/2019 1146 Gross per 24 hour  Intake 196.49 ml  Output 2900 ml  Net -2703.51 ml     Physical Exam: General:  Frail, elderly woman, laying in the bed  HEENT  moist oral mucous membranes  Pulm/lungs  coarse breath sounds, [place] O2  CVS/Heart  irregular rhythm  Abdomen:   Soft  Extremities:  + dependent edema  Neurologic:  Lethargic, responds to voice, hard of hearing  Skin:  Warm  foley  Basic Metabolic Panel:  Recent Labs  Lab 10/24/19 0609 10/24/19 0609 10/25/19 0425 10/25/19 0425 10/26/19 0604 10/26/19 0604 10/26/19 1612 10/27/19 1122 10/28/19 0647  NA 136   < > 134*  --  133*  --  134* 134* 135  K 5.5*   < > 5.8*  --  6.0*  --  4.7 4.5 4.1  CL 92*   < > 89*  --  86*  --  87* 84* 82*  CO2 29   < > 28  --  30  --  32 27 32  GLUCOSE 120*   < > 164*  --  144*  --  136* 128* 127*  BUN 38*   < > 45*  --  54*  --  55* 57* 60*  CREATININE 5.18*   < > 5.76*  --  6.07*  --  6.06* 6.06* 6.12*  CALCIUM 7.6*   < > 7.8*   < > 7.7*   < > 7.5* 8.0* 8.0*  MG 1.9  --  1.9  --  1.9  --   --   1.5* 1.7   < > = values in this interval not displayed.     CBC: Recent Labs  Lab 10/22/19 1119 10/23/19 0444 10/24/19 0609 10/25/19 0425 10/26/19 0604 10/27/19 1122 10/28/19 0647  WBC 11.2*   < > 6.7 9.3 8.8 5.9 6.3  NEUTROABS 7.6  --   --   --   --   --   --   HGB 11.5*   < > 10.0* 11.4* 11.8* 14.1 12.5  HCT 36.8   < > 31.6* 34.5* 35.6* 41.7 38.0  MCV 92.9   < > 91.9 88.9 87.7 86.3 86.6  PLT 244   < > 207 248 268 202 277   < > = values in this interval not displayed.      Lab Results  Component Value Date   HEPBSAG NON REACTIVE 10/23/2019   HEPBSAB NON REACTIVE 10/23/2019   HEPBIGM NON REACTIVE 10/23/2019      Microbiology:  Recent Results (from the past 240 hour(s))  SARS CORONAVIRUS 2 (TAT 6-24 HRS) Nasopharyngeal Nasopharyngeal Swab     Status: None   Collection Time: 10/22/19  1:33 PM   Specimen: Nasopharyngeal Swab  Result Value Ref Range Status   SARS Coronavirus 2 NEGATIVE NEGATIVE Final    Comment: (NOTE) SARS-CoV-2 target nucleic acids are NOT DETECTED. The SARS-CoV-2 RNA is generally detectable in upper and lower respiratory specimens during the acute phase of infection. Negative results do not preclude SARS-CoV-2 infection, do not rule out co-infections with other pathogens, and should not be used as the sole basis for treatment or other patient management decisions. Negative results must be combined with clinical observations, patient history, and epidemiological information. The expected result is Negative. Fact Sheet for Patients: SugarRoll.be Fact Sheet for Healthcare Providers: https://www.woods-mathews.com/ This test is not yet approved or cleared by the [place] FDA and  has been authorized for detection and/or diagnosis of SARS-CoV-2 by FDA under an Emergency Use Authorization (EUA). This EUA will remain  in effect (meaning this test can be used) for the duration of the COVID-19 declaration  under Section 56 4(b)(1) of the Act, 21 U.S.C. section 360bbb-3(b)(1), unless the authorization is terminated or revoked sooner. Performed at [facility], [place] [address], [place], [place] [address]   MRSA PCR Screening     Status: None   Collection Time: 10/26/19  6:30 AM   Specimen: Nasopharyngeal  Result Value Ref Range Status   MRSA by PCR NEGATIVE NEGATIVE Final    Comment:        The GeneXpert MRSA Assay (FDA approved for NASAL specimens only), is one component of a comprehensive MRSA colonization surveillance program. It is not intended to diagnose MRSA infection nor to guide or monitor treatment for MRSA infections. Performed at [facility], [place] [place], [place] [address]     Coagulation Studies: No results for input(s): LABPROT, INR in the last 72 hours.  Urinalysis: No results for input(s): COLORURINE, LABSPEC, PHURINE, GLUCOSEU, HGBUR, BILIRUBINUR, KETONESUR, PROTEINUR, UROBILINOGEN, NITRITE, LEUKOCYTESUR in the last 72 hours.  Invalid input(s): APPERANCEUR    Imaging: No results found.   Medications:    . feeding supplement (NEPRO CARB STEADY)  237 mL Oral BID BM  . heparin  5,000 Units Subcutaneous Q8H  . sodium chloride flush  10 mL Intravenous Q12H   acetaminophen, ipratropium-albuterol, polyethylene glycol  Assessment/ Plan:  86 y.o. female with multiple complex medical problems including diabetes type 2, insulin-dependent, hypertension, hypothyroidism, hyperlipidemia, congestive heart failure, pulmonary hypertension, history of stroke, atrial fibrillation  admitted on 10/22/2019 for Acute kidney failure, unspecified ([place]) [N17.9] Hyperkalemia [E87.5] Bradycardia [R00.1] Atrial fibrillation, unspecified type ([place]) [I48.91]  #.  Acute renal failure on chronic kidney disease stage IIIb Baseline creatinine of 1.18/GFR 43 on August 06, 2019 Underlying CKD likely secondary to diabetic nephropathy AKI likely secondary to  cardiorenal syndrome from poor perfusion related to cardiac arrhythmias Serum creatinine continues to be critically elevated at 6.12 No dialysis due to multiple comorbidities, frailty, family wishes Patient was aspirating therefore is currently n.p.o. Agree with palliative care/hospice   #Hyperkalemia Improved with Lokelma  # pulmonary hypertension, atrial fibrillation, coronary disease 2D echo from February 4: LVEF 55 to 60%, severely dilated left atrium, moderately elevated pulmonary pressure-57 mm, Complex cardiac physiology Managed with IV Lasix and IV dopamine-now discontinued Cardiology team is following    LOS: [place] 2/9/20212:16 [place], [place]  Note: This note was prepared with Dragon dictation. Any transcription errors are unintentional

## 2019-10-28 NOTE — Progress Notes (Signed)
Progress Note  Patient Name: Diane Mullins Date of Encounter: 10/28/2019  Primary Cardiologist: Ida Rogue, MD   Subjective   Patient's only complaint is of buttock pain from lying in bed.  No chest pain, shortness of breath, or lightheadedness.  Inpatient Medications    Scheduled Meds: . Chlorhexidine Gluconate Cloth  6 each Topical Daily  . heparin  5,000 Units Subcutaneous Q8H  . sodium chloride flush  10 mL Intravenous Q12H  . sodium zirconium cyclosilicate  10 g Oral Daily   Continuous Infusions: . DOPamine 5 mcg/kg/min (10/28/19 0646)   PRN Meds: acetaminophen, ipratropium-albuterol, polyethylene glycol   Vital Signs    Vitals:   10/27/19 1152 10/27/19 1639 10/27/19 1917 10/28/19 0405  BP: (!) 157/74 (!) 144/62 (!) 133/49 (!) 138/59  Pulse: 75 83 76 78  Resp: 16 16    Temp: 97.7 F (36.5 C) 97.9 F (36.6 C) 97.9 F (36.6 C) 97.7 F (36.5 C)  TempSrc: Oral Oral Oral Oral  SpO2: 98% 95% 94% 95%  Weight:      Height:        Intake/Output Summary (Last 24 hours) at 10/28/2019 0754 Last data filed at 10/28/2019 0404 Gross per 24 hour  Intake 186.49 ml  Output 2700 ml  Net -2513.51 ml   Last 3 Weights 10/27/2019 10/22/2019 10/22/2019  Weight (lbs) 186 lb 9.6 oz 200 lb (No Data)  Weight (kg) 84.641 kg 90.719 kg (No Data)      Telemetry    NSR with HR 65-100 bpm - Personally Reviewed  ECG    No new tracing.  Physical Exam   GEN: Asleep but easily awakened. Neck: No JVD or HJR. Cardiac: RRR with 2/6 systolic murmur. Respiratory: Bibasilar crackles with fair air movement. GI: Soft, nontender, non-distended  MS: 1+ pretibial edema bilaterally Neuro:  Some garbled speech.  Moves all 4 extremities. Psych: Flat affect.  Labs    High Sensitivity Troponin:   Recent Labs  Lab 10/22/19 1119 10/22/19 1333  TROPONINIHS 64* 58*      Chemistry Recent Labs  Lab 10/23/19 0444 10/24/19 0609 10/26/19 0604 10/26/19 1612 10/27/19 1122  NA 139   < >  133* 134* 134*  K 5.2*   < > 6.0* 4.7 4.5  CL 99   < > 86* 87* 84*  CO2 27   < > 30 32 27  GLUCOSE 80   < > 144* 136* 128*  BUN 32*   < > 54* 55* 57*  CREATININE 4.01*   < > 6.07* 6.06* 6.06*  CALCIUM 7.9*   < > 7.7* 7.5* 8.0*  PROT 5.6*  --   --   --   --   ALBUMIN 3.0*  2.7*  --   --   --   --   AST 36  --   --   --   --   ALT 28  --   --   --   --   ALKPHOS 176*  --   --   --   --   BILITOT 0.5  --   --   --   --   GFRNONAA 10*   < > 6* 6* 6*  GFRAA 11*   < > 7* 7* 7*  ANIONGAP 13   < > 17* 15 23*   < > = values in this interval not displayed.     Hematology Recent Labs  Lab 10/26/19 0604 10/27/19 1122 10/28/19 0647  WBC 8.8  5.9 6.3  RBC 4.06 4.83 4.39  HGB 11.8* 14.1 12.5  HCT 35.6* 41.7 38.0  MCV 87.7 86.3 86.6  MCH 29.1 29.2 28.5  MCHC 33.1 33.8 32.9  RDW 14.5 14.3 14.0  PLT 268 202 277    BNPNo results for input(s): BNP, PROBNP in the last 168 hours.   DDimer No results for input(s): DDIMER in the last 168 hours.   Radiology    No results found.  Cardiac Studies   TTE (10/23/19): 1. Left ventricular ejection fraction, by visual estimation, is 55 to  60%. The left ventricle has normal function. There is no left ventricular  hypertrophy.  2. The left ventricle has no regional wall motion abnormalities.  3. Global right ventricle has normal systolic function.The right  ventricular size is mildly enlarged. No increase in right ventricular wall  thickness.  4. Left atrial size was severely dilated.  5. Moderately elevated pulmonary artery systolic pressure.  6. The tricuspid regurgitant velocity is 3.45 m/s, and with an assumed  right atrial pressure of 10 mmHg, the estimated right ventricular systolic  pressure is moderately elevated at 57.7 mmHg.  7. Bradycardia noted, rate 41 bpm   Patient Profile     86 y.o. female with history of Afib/flutter diagnosed in 10/2018 s/p TEE/DCCV in 10/2018 and again in 11/2018 previously on Eliquis though stopped  secondary to GI bleed in 11/2018, HFpEF/pulmonary hypertension, atrial tachycardia, DM2, HTN, HLD, prior stroke, vertigo, and recent fall in 09/2019 with gait instability who we are seeing for junctional rhythm in the setting of hyperkalemia with ARF and Coreg/amiodarone usage.  Assessment & Plan    Junctional rhythm and bradycardia: This has resolved with tele showing sinus rhythm for the last day.  The patient remains on dopamine infusion at 5 mcg/kg/min.  Discontinue dopamine infusion.  Avoid amiodarone and other heart rate suppressing medications.  No indication for PPM.  CAD and elevated troponin: Minimal elevation noted on admission without symptoms of ischemia.  No plans for ischemia workup during this admission.  Consider restarting statin when felt to be safe by primary team.  Atrial fibrillation/flutter: Patient is maintaining sinus rhythm, off amiodarone and carvedilol in the setting of junctional rhythm.  Defer restarting beta blocker and amiodarone.  Off anticoagulation given fall with head trauma in 09/2019, though this will need to be readdressed once she has recovered from her acute illness given CHADSVASC score of at least 8.  HFpEF: Mild leg edema noted as well as bilateral lung crackles.  Renal function stable.  Fluid balance recorded as net negative 2.5 L yesterday (no weight recorded today but down 13 pounds from admission yesterday).  Consider decreasing furosemide dosing (had been 80 mg IV Q6 hours; will defer to nephrology.  Acute kidney injury superimposed on chronic kidney disease:  Per nephrology.  For questions or updates, please contact [place] Please consult [email or web address] for contact info under [facility].     Signed, Nelva Bush, MD  10/28/2019, 7:54 AM

## 2019-10-28 NOTE — TOC Initial Note (Signed)
Transition of Care [facility]) - Initial/Assessment Note    Patient Details  Name: Diane Mullins MRN: [id number] Date of Birth: April 24, 1933  Transition of Care [facility]) CM/SW Contact:    Victorino Dike, RN Phone Number: 10/28/2019, 9:16 AM  Clinical Narrative:                  Met with nurse and patient on Monday.  Family was not in room.  Will contact POA for further clarification on discharge planning. Unclear at this time if Palliative is being consulted on case.  Patient refused hemodialysis.  At this time she can follow commands and tries to respond but appears very tired, she will shake her head and whisper while I was asking questions.  This was right after physical therapy.  Will attempt to meet with patient on Tuesday.   Expected Discharge Plan: Skilled Nursing Facility Barriers to Discharge: Continued Medical Work up   Patient Goals and CMS Choice        Expected Discharge Plan and Services Expected Discharge Plan: [place]   Discharge Planning Services: CM Consult   Living arrangements for the past 2 months: [place]                                      Prior Living Arrangements/Services Living arrangements for the past 2 months: [place] Lives with:: Facility Resident   Do you feel safe going back to the place where you live?: Yes      Need for Family Participation in Patient Care: Yes (Comment) Care giver support system in place?: Yes (comment)   Criminal Activity/Legal Involvement Pertinent to Current Situation/Hospitalization: No - Comment as needed  Activities of Daily Living Home Assistive Devices/Equipment: Wheelchair ADL Screening (condition at time of admission) Patient's cognitive ability adequate to safely complete daily activities?: Yes Is the patient deaf or have difficulty hearing?: No Does the patient have difficulty seeing, even when wearing glasses/contacts?: No Does the patient have  difficulty concentrating, remembering, or making decisions?: No Patient able to express need for assistance with ADLs?: Yes Does the patient have difficulty dressing or bathing?: Yes Independently performs ADLs?: No Communication: Independent Dressing (OT): Needs assistance Is this a change from baseline?: Pre-admission baseline Grooming: Independent Feeding: Independent Bathing: Needs assistance Is this a change from baseline?: Pre-admission baseline Toileting: Needs assistance Is this a change from baseline?: Pre-admission baseline In/Out Bed: Needs assistance Is this a change from baseline?: Pre-admission baseline Walks in Home: Independent with device (comment) Does the patient have difficulty walking or climbing stairs?: Yes Weakness of Legs: Both Weakness of Arms/Hands: Both  Permission Sought/Granted                  Emotional Assessment Appearance:: Appears stated age Attitude/Demeanor/Rapport: Lethargic     Alcohol / Substance Use: Not Applicable Psych Involvement: No (comment)  Admission diagnosis:  Acute kidney failure, unspecified ([place]) [N17.9] Hyperkalemia [E87.5] Bradycardia [R00.1] Atrial fibrillation, unspecified type [facility]) [I48.91] Patient Active Problem List   Diagnosis Date Noted  . Atrial fibrillation ([place])   . Fall 10/22/2019  . Cough 10/22/2019  . Bradycardia 10/22/2019  . Decreased appetite 09/10/2019  . Depression, major, single episode, moderate ([place]) 09/10/2019  . UTI (urinary tract infection) 07/09/2019  . Generalized weakness 06/23/2019  . GI bleed 06/16/2019  . B12 deficiency 04/06/2019  . Balance problem 04/06/2019  . Gastrointestinal hemorrhage  with melena   . Symptomatic anemia   . Melena 12/11/2018  . Constipation 12/10/2018  . Atrial flutter ([place]) 10/17/2018  . 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 AKI (acute kidney injury) ([place])   . Acute on chronic diastolic heart failure ([place])   . Respiratory failure ([place]) 07/05/2018  .  Coronary artery calcification seen on CAT scan 11/16/2017  . Morbid obesity ([place]) 10/30/2017  . (HFpEF) heart failure with preserved ejection fraction ([place]) 09/28/2017  . Family history of colon cancer 04/17/2017  . Closed right ankle fracture 10/28/2015  . Fatigue 10/18/2015  . TIA (transient ischemic attack) 09/18/2015  . Orthostatic hypotension 08/02/2015  . Adjustment disorder with anxious mood 03/30/2015  . Postmenopausal estrogen deficiency 06/03/2014  . DNR (do not resuscitate) discussion 03/04/2014  . Gait disturbance 05/07/2013  . Diabetes ([place]) 04/21/2013  . Shortness of breath 12/09/2012  . Hearing loss 12/09/2012  . Anxiety 01/31/2012  . Hypertension 12/22/2011  . Hyperlipidemia 12/22/2011  . Hypothyroidism 12/22/2011   PCP:  [name], MD Pharmacy:   [facility] [address] [place] [address] Phone: [phone] Fax: [phone]     Social Determinants of Health (SDOH) Interventions    Readmission Risk Interventions Readmission Risk Prevention Plan 06/25/2019 12/16/2018 12/15/2018  Transportation Screening - Complete Complete  PCP or Specialist Appt within 5-7 Days - - -  PCP or Specialist Appt within 3-5 Days - Complete Patient refused  Home Care Screening - - -  Medication Review (RN CM) - - -  [place] or [place] Complete Complete Complete  Social Work Consult for [place] Planning/Counseling Complete Complete Complete  Palliative Care Screening - Not Applicable Not Applicable  Medication Review Press photographer) - Complete Complete  Med Review Comments - - -  PCP or Specialist appointment within 3-5 days of discharge - - -  PCP/Specialist Appt Not Complete comments - - -  [place] or [place] recent data might be hidden

## 2019-10-28 NOTE — Progress Notes (Signed)
PT Cancellation Note  Patient Details Name: Diane Mullins MRN: [id number] DOB: 19-Jan-1933   Cancelled Treatment:    Reason Eval/Treat Not Completed: Other (comment): Per nursing pt transitioning to comfort care.  Nursing requested PT orders to be completed at this time.     Linus Salmons PT, DPT 10/28/19, 11:43 AM

## 2019-10-28 NOTE — TOC Progression Note (Addendum)
Transition of Care [facility]) - Progression Note    Patient Details  Name: Diane Mullins MRN: [id number] Date of Birth: 1933/01/24  Transition of Care [facility]) CM/SW [place], RN Phone Number: 10/28/2019, 11:55 AM  Clinical Narrative:    Asked by nurse to visit patient and daughter Diane Mullins. family was asking to speak with Case Manager.  I entered room to round on patient.  Introduced myself to this daughter, Verified she was [place]. Explained that I worked with Transition of Care department and help plan discharge. Daughter asked if we were moving patient to Hospice house.  I asked if she had been contacted by hospice. Daughter said no, she had talked with MD on Sunday about palliative care so she thought we were working on this.  She reports her mother is done.  I asked patient and daughter if they wanted me to talk to MD.  I left room to send message to physician.  When re-entering room, Diane Mullins had cell phone by patient, family on other end of line, with patient planning her funeral. I dismissed myself from room. When daughter left room, she reported she had to leave and can be contacted on [phone].  Expected Discharge Plan: Skilled Nursing Facility Barriers to Discharge: Continued Medical Work up  Expected Discharge Plan and Services Expected Discharge Plan: [place]   Discharge Planning Services: CM Consult   Living arrangements for the past 2 months: [place]                                       Social Determinants of Health (SDOH) Interventions    Readmission Risk Interventions Readmission Risk Prevention Plan 06/25/2019 12/16/2018 12/15/2018  Transportation Screening - Complete Complete  PCP or Specialist Appt within 5-7 Days - - -  PCP or Specialist Appt within 3-5 Days - Complete Patient refused  Home Care Screening - - -  Medication Review (RN CM) - - -  [place] or [place] Complete Complete Complete   Social Work Consult for [place] Planning/Counseling Complete Complete Complete  Palliative Care Screening - Not Applicable Not Applicable  Medication Review Press photographer) - Complete Complete  Med Review Comments - - -  PCP or Specialist appointment within 3-5 days of discharge - - -  PCP/Specialist Appt Not Complete comments - - -  [place] or [place] recent data might be hidden

## 2019-10-28 NOTE — Progress Notes (Signed)
SLP Cancellation Note  Patient Details Name: Diane Mullins MRN: [id number] DOB: [birth year]   Cancelled treatment:       Reason Eval/Treat Not Completed: (chart reviewed; consulted NSG). Pt was resting. Per NSG discussion, pt was restarted on her dysphagia diet: Dys. Level 3 w/ Nectar liquids as per BSE on Sat by SLP. NSG reported pt tolerated sips of Nectar liquids "well" w/ No overt s/s of aspiration noted; pt did not want any foods on the tray. NSG assisted pt w/ all intake taken.  ST services will f/u tomorrow at mealtime w/ pt's toleration of oral diet; monitoring of pt's medical and alertness status' for appropriate time to attempt trials to upgrade to thin liquids. Recommend aspiration precautions and Pills in puree w/ NSG for safer swallowing. Precautions posted in room. NSG agreed.    Orinda Kenner, MS, CCC-SLP Janalynn Eder 10/28/2019, 4:00 PM

## 2019-10-29 DIAGNOSIS — Z515 Encounter for palliative care: Secondary | ICD-10-CM

## 2019-10-29 DIAGNOSIS — Z66 Do not resuscitate: Secondary | ICD-10-CM

## 2019-10-29 DIAGNOSIS — Z7189 Other specified counseling: Secondary | ICD-10-CM

## 2019-10-29 LAB — BASIC METABOLIC PANEL
Anion gap: 18 — ABNORMAL HIGH (ref 5–15)
BUN: 67 mg/dL — ABNORMAL HIGH (ref 8–23)
CO2: 34 mmol/L — ABNORMAL HIGH (ref 22–32)
Calcium: 7.9 mg/dL — ABNORMAL LOW (ref 8.9–10.3)
Chloride: 84 mmol/L — ABNORMAL LOW (ref 98–111)
Creatinine, Ser: 6.21 mg/dL — ABNORMAL HIGH (ref 0.44–1.00)
GFR calc Af Amer: 7 mL/min — ABNORMAL LOW (ref >60)
GFR calc non Af Amer: 6 mL/min — ABNORMAL LOW (ref >60)
Glucose, Bld: 118 mg/dL — ABNORMAL HIGH (ref 70–99)
Potassium: 3.7 mmol/L (ref 3.5–5.1)
Sodium: 136 mmol/L (ref 135–145)

## 2019-10-29 LAB — CBC
HCT: 34.3 % — ABNORMAL LOW (ref 36.0–46.0)
Hemoglobin: 11.5 g/dL — ABNORMAL LOW (ref 12.0–15.0)
MCH: 29.4 pg (ref 26.0–34.0)
MCHC: 33.5 g/dL (ref 30.0–36.0)
MCV: 87.7 fL (ref 80.0–100.0)
Platelets: 262 10*3/uL (ref 150–400)
RBC: 3.91 MIL/uL (ref 3.87–5.11)
RDW: 14.2 % (ref 11.5–15.5)
WBC: 5.5 10*3/uL (ref 4.0–10.5)
nRBC: 0 % (ref 0.0–0.2)

## 2019-10-29 LAB — MAGNESIUM: Magnesium: 1.9 mg/dL (ref 1.7–2.4)

## 2019-10-29 MED ORDER — POLYVINYL ALCOHOL 1.4 % OP SOLN
1.0000 [drp] | Freq: Four times a day (QID) | OPHTHALMIC | Status: DC | PRN
  Filled 2019-10-29: qty 15

## 2019-10-29 MED ORDER — HALOPERIDOL LACTATE 5 MG/ML IJ SOLN: 0.5000 mg | INTRAMUSCULAR | Status: DC | PRN

## 2019-10-29 MED ORDER — ONDANSETRON HCL 4 MG/2ML IJ SOLN: 4.0000 mg | Freq: Four times a day (QID) | INTRAMUSCULAR | Status: DC | PRN

## 2019-10-29 MED ORDER — GLYCOPYRROLATE 0.2 MG/ML IJ SOLN
0.3000 mg | INTRAMUSCULAR | Status: DC | PRN
  Filled 2019-10-29: qty 1.5

## 2019-10-29 MED ORDER — BIOTENE DRY MOUTH MT LIQD: 15.0000 mL | OROMUCOSAL | Status: DC | PRN

## 2019-10-29 MED ORDER — FENTANYL CITRATE (PF) 100 MCG/2ML IJ SOLN: 25.0000 ug | INTRAMUSCULAR | Status: DC | PRN

## 2019-10-29 NOTE — Progress Notes (Signed)
SLP Cancellation Note  Patient Details Name: Diane Mullins MRN: [id number] DOB: [birth year]   Cancelled treatment:       Reason Eval/Treat Not Completed: (chart reviewed; consulted NSG/Palliative Care). Per report and MD note, a Palliative Care consult has been placed for today. Pt continues to present w/ weakness from extensive illness and hospitalization. NSG has reported pt tolerates sips of Nectar liquids w/ No immediate, overt s/s of aspiration noted; pt has not taken much foods. Pt requires assistance w/ all oral intake d/t weakness and Cognitive status. W/ pt's waxing and waning alertness at times, recommend holding po intake if pt is too drowsy d/t risk for aspiration.  ST services will f/u w/ pt's status next 1-2 days for further needs; further assessment of swallowing for diet upgrade(liquids). While pt's medical and Cognitive status' are declined, pt's risk for aspiration is increased. Recommend aspiration precautions; oral care frequently during the day for hygiene and stimulation of swallowing.      Orinda Kenner, [place], CCC-SLP Diane Mullins 10/29/2019, 12:58 PM

## 2019-10-29 NOTE — Progress Notes (Signed)
PROGRESS NOTE    Diane Mullins  PQZ:[id number] DOB: 09/22/1932 DOA: 10/22/2019 PCP: [name], MD   Brief Narrative:   Diane Mullins an 86 y.o.Caucasian femalewith past medical history significant for atrial fibrillation/flutter, s/p TEE/DCCV (2/4/2020and again November 25, 3352, diastolic heart failure, pulmonary hypertension, diabetes type 2, hypertension, prior stroke/TIA, GI bleed March 2020 , recent fall October 13, 2019 gait instability presenting to the hospital with weakness, bradycardia.  2/10: Patient seen.  Exam deferred given comfort measures status.  Palliative care met with the patient's family today and after repeated discussions the family have elected to pursue comfort measures with ultimate goal of bringing patient back home with hospice services.  The scope of hospice services was explained at length to the patient's daughter at bedside.  All questions were answered.  Assessment & Plan:   Principal Problem:   Bradycardia Active Problems:   Hypertension   Diabetes (HCC)   (HFpEF) heart failure with preserved ejection fraction (HCC)   Atrial flutter (HCC)   Fall   Atrial fibrillation (HCC)  Acute hypoxic respiratory failure Fluid overload Symptomatic bradycardia Worsening acute kidney injury, likely acute tubular necrosis  Long conversation with patient's daughter at bedside today in conjunction with the palliative care service.  Explained with the meaning of hospice was and what services they will be able to provide.  The patient has significant and worsening renal function now with a creatinine of 6 and an uptrending BUN.  Mental status is waxing and waning.  The patient remains lethargic however on prompting is able to answer questions about orientation and is actually oriented x3.  Despite this the numerous medical comorbidities as well as her advanced age and decreased likelihood of any meaningful functional recovery I do feel that hospice is an  appropriate disposition at this point.  Greatly appreciate palliative care and involvement.  Currently will plan to discharge home with hospice services once all DME is delivered to the house.  While inpatient will remove all medications not focused on patient comfort.  Remainder inpatient management will be focused on symptom control.   DVT prophylaxis: None, comfort measures Code Status: DNR Family Communication: Daughter at bedside Disposition Plan: Home with hospice, anticipate tomorrow, once DME delivered to house  Consultants:   Palliative care  Procedures:   None  Antimicrobials:   None   Subjective: Patient seen, exam deferred No apparent distress Oriented x3  Objective: Vitals:   10/29/19 0300 10/29/19 0300 10/29/19 0807 10/29/19 1132  BP: (!) 127/51  (!) 136/58 (!) 120/58  Pulse: 66 65 63 64  Resp: '16  17 18  ' Temp: 97.9 F (36.6 C)  (!) 97.4 F (36.3 C) 97.6 F (36.4 C)  TempSrc:   Oral Oral  SpO2: (!) 89% 94% 98% 96%  Weight:      Height:        Intake/Output Summary (Last 24 hours) at 10/29/2019 1532 Last data filed at 10/29/2019 0700 Gross per 24 hour  Intake --  Output 1050 ml  Net -1050 ml   Filed Weights   10/22/19 1121 10/27/19 0501  Weight: 90.7 kg 84.6 kg    Examination:  Exam deferred given comfort measures status    Data Reviewed: I have personally reviewed following labs and imaging studies  CBC: Recent Labs  Lab 10/25/19 0425 10/26/19 0604 10/27/19 1122 10/28/19 0647 10/29/19 0740  WBC 9.3 8.8 5.9 6.3 5.5  HGB 11.4* 11.8* 14.1 12.5 11.5*  HCT 34.5* 35.6* 41.7 38.0 34.3*  MCV 88.9 87.7 86.3 86.6 87.7  PLT 248 268 202 277 222   Basic Metabolic Panel: Recent Labs  Lab 10/25/19 0425 10/25/19 0425 10/26/19 0604 10/26/19 1612 10/27/19 1122 10/28/19 0647 10/29/19 0740  NA 134*   < > 133* 134* 134* 135 136  K 5.8*   < > 6.0* 4.7 4.5 4.1 3.7  CL 89*   < > 86* 87* 84* 82* 84*  CO2 28   < > 30 32 27 32 34*  GLUCOSE  164*   < > 144* 136* 128* 127* 118*  BUN 45*   < > 54* 55* 57* 60* 67*  CREATININE 5.76*   < > 6.07* 6.06* 6.06* 6.12* 6.21*  CALCIUM 7.8*   < > 7.7* 7.5* 8.0* 8.0* 7.9*  MG 1.9  --  1.9  --  1.5* 1.7 1.9   < > = values in this interval not displayed.   GFR: Estimated Creatinine Clearance: 6.4 mL/min (A) (by C-G formula based on SCr of 6.21 mg/dL (H)). Liver Function Tests: Recent Labs  Lab 10/23/19 0444  AST 36  ALT 28  ALKPHOS 176*  BILITOT 0.5  PROT 5.6*  ALBUMIN 3.0*  2.7*   No results for input(s): LIPASE, AMYLASE in the last 168 hours. No results for input(s): AMMONIA in the last 168 hours. Coagulation Profile: No results for input(s): INR, PROTIME in the last 168 hours. Cardiac Enzymes: No results for input(s): CKTOTAL, CKMB, CKMBINDEX, TROPONINI in the last 168 hours. BNP (last 3 results) No results for input(s): PROBNP in the last 8760 hours. HbA1C: No results for input(s): HGBA1C in the last 72 hours. CBG: Recent Labs  Lab 10/23/19 2046 10/24/19 0139 10/24/19 0833 10/25/19 0005 10/25/19 0150  GLUCAP 139* 127* 137* 177* 193*   Lipid Profile: No results for input(s): CHOL, HDL, LDLCALC, TRIG, CHOLHDL, LDLDIRECT in the last 72 hours. Thyroid Function Tests: No results for input(s): TSH, T4TOTAL, FREET4, T3FREE, THYROIDAB in the last 72 hours. Anemia Panel: No results for input(s): VITAMINB12, FOLATE, FERRITIN, TIBC, IRON, RETICCTPCT in the last 72 hours. Sepsis Labs: No results for input(s): PROCALCITON, LATICACIDVEN in the last 168 hours.  Recent Results (from the past 240 hour(s))  SARS CORONAVIRUS 2 (TAT 6-24 HRS) Nasopharyngeal Nasopharyngeal Swab     Status: None   Collection Time: 10/22/19  1:33 PM   Specimen: Nasopharyngeal Swab  Result Value Ref Range Status   SARS Coronavirus 2 NEGATIVE NEGATIVE Final    Comment: (NOTE) SARS-CoV-2 target nucleic acids are NOT DETECTED. The SARS-CoV-2 RNA is generally detectable in upper and lower respiratory  specimens during the acute phase of infection. Negative results do not preclude SARS-CoV-2 infection, do not rule out co-infections with other pathogens, and should not be used as the sole basis for treatment or other patient management decisions. Negative results must be combined with clinical observations, patient history, and epidemiological information. The expected result is Negative. Fact Sheet for Patients: SugarRoll.be Fact Sheet for Healthcare Providers: https://www.woods-mathews.com/ This test is not yet approved or cleared by the [place] FDA and  has been authorized for detection and/or diagnosis of SARS-CoV-2 by FDA under an Emergency Use Authorization (EUA). This EUA will remain  in effect (meaning this test can be used) for the duration of the COVID-19 declaration under Section 56 4(b)(1) of the Act, 21 U.S.C. section 360bbb-3(b)(1), unless the authorization is terminated or revoked sooner. Performed at [facility], [place] [address] [place], [place] [address]   MRSA PCR Screening  Status: None   Collection Time: 10/26/19  6:30 AM   Specimen: Nasopharyngeal  Result Value Ref Range Status   MRSA by PCR NEGATIVE NEGATIVE Final    Comment:        The GeneXpert MRSA Assay (FDA approved for NASAL specimens only), is one component of a comprehensive MRSA colonization surveillance program. It is not intended to diagnose MRSA infection nor to guide or monitor treatment for MRSA infections. Performed at [facility], [address] [place], [place] [address]          Radiology Studies: No results found.      Scheduled Meds: . sodium chloride flush  10 mL Intravenous Q12H   Continuous Infusions:   LOS: 7 days    Time spent: 20 minutes    Sidney Ace, MD [facility] Pager [phone]  If 7PM-7AM, please contact night-coverage  10/29/2019, 3:32 PM

## 2019-10-29 NOTE — Consult Note (Addendum)
Consultation Note Date: 10/29/2019   Patient Name: Diane Mullins  DOB: [birth year]  MRN: [id number]  Age / Sex: 86 y.o., female   PCP: Diane Haven, MD Referring Physician: Sidney Ace, MD   REASON FOR CONSULTATION:Establishing goals of care  Palliative Care consult requested for goals of care discussion in this 86 y.o. female with multiple medical problems including atrial fibrillation, diabetes, pulmonary hypertension, hyperlipidemia, CVA, and diastolic CHF. Patient was sent to ED by her PCP due to bradycardia (40s). It was noted patient fell at home a week prior with continued weakness since the fall. During work-up potassium 6.6, BUN 26, Cr 3.06, Troponin 64. Since admission patient required lasix drip but have been able to discontinue. Dopamine drip was initiated and also has been discontinued. Patient seen by SLP with recommendations for dysphagia diet with nectar thick liquids. Patient continues to have worsening renal failure (BUN 67/Cr 6.21).   Clinical Assessment and Goals of Care: I have reviewed medical records including lab results, imaging, Epic notes, and MAR, received report from the bedside RN, and assessed the patient. I met at the bedside with patient and her daughter, Diane Mullins to discuss diagnosis prognosis, GOC, EOL wishes, disposition and options.   Patient awake and alert. She complained of lower back pain. She states she has shortness of breath at times (more with exertion).   I introduced Palliative Medicine as specialized medical care for people living with serious illness. It focuses on providing relief from the symptoms and stress of a serious illness. The goal is to improve quality of life for both the patient and the family. Daughter verbalized understanding and appreciation.   Family also acknowledges patient's granddaughter Diane Mullins) is a Therapist, sports with former Palliative experience.   We discussed a brief life review of the patient, along with her  functional and nutritional status. Daughter shares patient has been a resident at [facility] since 2018. Prior to that she was at [facility]. Family shares patient's decline over the past year specifically due to COVID restrictions and lack of family visitation.   Diane Mullins shares patient has been wheelchair bound. She has decreased appetite over the past several weeks and has become more confined to her recliner. Daughter reports patient also complained of nausea and vomiting.   We discussed Her current illness and what it means in the larger context of Her on-going co-morbidities. With specific discussions regarding patient's worsening renal failure, and overall functional and nutritional decline. Natural disease trajectory and expectations at EOL were discussed.  Daughter verbalizes understanding of patient's current illness and co-morbidities. Patient verbalizes that she is tired. Daughter acknowledges previous discussions with providers and understanding of health challenges. [name] is not interested in pursuing further aggressive treatments such as hemodialysis.   I attempted to elicit values and goals of care important to the patient.    The difference between aggressive medical intervention and comfort care was considered in light of the patient's goals of care. I educated patient/family on comfort care measures. Family understands patient would no longer receive aggressive medical interventions such as continuous vital signs, lab work, radiology testing, or medications not focused on comfort. All care would focus on how the patient is looking and feeling. This would include management of any symptoms that may cause discomfort, pain, shortness of breath, cough, nausea, agitation, anxiety, and/or secretions etc. Symptoms would be managed with medications and other non-pharmacological interventions such as spiritual support if requested, repositioning, music therapy,  or therapeutic listening.  Family verbalized understanding and appreciation.    Hospice and Palliative Care services outpatient were explained and offered. Patient and family verbalized their understanding and awareness of both palliative and hospice's goals and philosophy of care.   Daughter expressed wishes for hospice support at discharge. Extensive discussion with support of Dr. Priscella Mann regarding all available options for hospice. We discussed home with hospice vs. Residential hospice facility.   1350: After further family discussions daughter and patient's granddaughter Diane Mullins have decided to take patient home to [facility] to allow patient to be in a familiar environment and amongst all of her family without limitations. I discussed with Diane Mullins and Diane Mullins process of outpatient hospice support and potential needs prior to patient discharging from hospital. All questions answered and support given. Diane Mullins verbalized wishes to transition patient's care to full comfort with a goal of discharging home once approved by outpatient hospice and equipment delivered. Support given.   Questions and concerns were addressed.  Hard Choices booklet left for review. The family was encouraged to call with questions or concerns.  PMT will continue to support holistically.   SOCIAL HISTORY:     reports that she has never smoked. She has never used smokeless tobacco. She reports that she does not drink alcohol or use drugs.  CODE STATUS: DNR  ADVANCE DIRECTIVES: Diane Mullins (daughter/POA)   SYMPTOM MANAGEMENT: see below   Palliative Prophylaxis:   Aspiration, Bowel Regimen, Delirium Protocol, Eye Care, Frequent Pain Assessment, Oral Care and Turn Reposition  PSYCHO-SOCIAL/SPIRITUAL:  Support System: Family Desire for further Chaplaincy support:NO  Additional Recommendations (Limitations, Scope, Preferences):  Full Comfort Care   PAST MEDICAL HISTORY: Past Medical History:  Diagnosis Date  . Atrial fibrillation ([place])    . Benign paroxysmal positional vertigo 07/17/2014  . Chronic diastolic CHF (congestive heart failure) ([place])    a. echo 10/19: EF of 60-65%, no RWMA, Gr2DD, calcified mitral annulus with mild MR, mildly to moderately dilated left atrium, moderate TR, PASP 52 mmHg  . Diabetes mellitus   . H/O: rheumatic fever   . HOH (hard of hearing)   . Hyperlipidemia   . Hypertension   . Pulmonary hypertension ([place])   . Stroke [facility])    TIA Jan. 1st  . Teratoma of left ovary   . Thyroid disease     PAST SURGICAL HISTORY:  Past Surgical History:  Procedure Laterality Date  . ABDOMINAL HYSTERECTOMY  1973   menorrhagia  . CARDIOVERSION N/A 10/22/2018   Procedure: CARDIOVERSION;  Surgeon: Nelva Bush, MD;  Location: [facility] ORS;  Service: Cardiovascular;  Laterality: N/A;  . CARDIOVERSION N/A 11/26/2018   Procedure: CARDIOVERSION (CATH LAB);  Surgeon: Minna Merritts, MD;  Location: [facility] ORS;  Service: Cardiovascular;  Laterality: N/A;  . COLONOSCOPY WITH PROPOFOL N/A 12/16/2018   Procedure: COLONOSCOPY WITH PROPOFOL;  Surgeon: Lin Landsman, MD;  Location: [facility] ENDOSCOPY;  Service: Gastroenterology;  Laterality: N/A;  . ENTEROSCOPY N/A 12/16/2018   Procedure: Push ENTEROSCOPY;  Surgeon: Lin Landsman, MD;  Location: [facility] ENDOSCOPY;  Service: Gastroenterology;  Laterality: N/A;  . ESOPHAGOGASTRODUODENOSCOPY N/A 06/18/2019   Procedure: ESOPHAGOGASTRODUODENOSCOPY (EGD);  Surgeon: Lin Landsman, MD;  Location: [facility] ENDOSCOPY;  Service: Gastroenterology;  Laterality: N/A;  . ESOPHAGOGASTRODUODENOSCOPY (EGD) WITH PROPOFOL N/A 12/14/2018   Procedure: ESOPHAGOGASTRODUODENOSCOPY (EGD) WITH PROPOFOL;  Surgeon: Lucilla Lame, MD;  Location: [facility] ENDOSCOPY;  Service: Endoscopy;  Laterality: N/A;  . GIVENS CAPSULE STUDY N/A 06/18/2019   Procedure: GIVENS CAPSULE STUDY;  Surgeon: Marius Ditch,  Tally Due, MD;  Location: [place];  Service: Gastroenterology;  Laterality: N/A;  . HERNIA REPAIR  1994    . ORIF ANKLE FRACTURE Right 10/29/2015   Procedure: OPEN REDUCTION INTERNAL FIXATION (ORIF) ANKLE FRACTURE;  Surgeon: Thornton Park, MD;  Location: [facility] ORS;  Service: Orthopedics;  Laterality: Right;  . TEE WITHOUT CARDIOVERSION N/A 10/22/2018   Procedure: TRANSESOPHAGEAL ECHOCARDIOGRAM (TEE);  Surgeon: Nelva Bush, MD;  Location: [facility] ORS;  Service: Cardiovascular;  Laterality: N/A;  . VAGINAL DELIVERY     x4    ALLERGIES:  is allergic to oxycodone.   MEDICATIONS:  Current Facility-Administered Medications  Medication Dose Route Frequency Provider Last Rate Last Admin  . acetaminophen (TYLENOL) tablet 650 mg  650 mg Oral Q6H PRN Bonnell Public Tublu, MD      . feeding supplement (NEPRO CARB STEADY) liquid 237 mL  237 mL Oral BID BM Enzo Bi, MD   237 mL at 10/28/19 1506  . heparin injection 5,000 Units  5,000 Units Subcutaneous Q8H Vashti Hey, MD   5,000 Units at 10/29/19 0532  . ipratropium-albuterol (DUONEB) 0.5-2.5 (3) MG/3ML nebulizer solution 3 mL  3 mL Nebulization Q6H PRN Lang Snow, NP   3 mL at 10/25/19 2247  . polyethylene glycol (MIRALAX / GLYCOLAX) packet 17 g  17 g Oral Daily PRN Bonnell Public Tublu, MD      . sodium chloride flush (NS) 0.9 % injection 10 mL  10 mL Intravenous Q12H Enzo Bi, MD   10 mL at 10/28/19 0912    VITAL SIGNS: BP (!) 136/58 (BP Location: Left Arm)   Pulse 63   Temp (!) 97.4 F (36.3 C) (Oral)   Resp 17   Ht 5' (1.524 m)   Wt 84.6 kg   SpO2 98%   BMI 36.44 kg/m  Filed Weights   10/22/19 1121 10/27/19 0501  Weight: 90.7 kg 84.6 kg    Estimated body mass index is 36.44 kg/m as calculated from the following:   Height as of this encounter: 5' (1.524 m).   Weight as of this encounter: 84.6 kg.  LABS: CBC:    Component Value Date/Time   WBC 5.5 10/29/2019 0740   HGB 11.5 (L) 10/29/2019 0740   HGB 11.2 08/02/2018 1005   HCT 34.3 (L) 10/29/2019 0740   HCT 35.4 08/02/2018 1005   PLT 262  10/29/2019 0740   PLT 255 08/02/2018 1005   Comprehensive Metabolic Panel:    Component Value Date/Time   NA 136 10/29/2019 0740   NA 142 08/02/2018 1005   K 3.7 10/29/2019 0740   CO2 34 (H) 10/29/2019 0740   BUN 67 (H) 10/29/2019 0740   BUN 11 08/02/2018 1005   CREATININE 6.21 (H) 10/29/2019 0740   ALBUMIN 2.7 (L) 10/23/2019 0444   ALBUMIN 3.0 (L) 10/23/2019 0444   ALBUMIN 4.1 09/15/2015 1000     Review of Systems  Constitutional: Positive for appetite change.  Respiratory: Positive for shortness of breath.   Neurological: Positive for weakness.  Unless otherwise noted, a complete review of systems is negative.   Prognosis: weeks-months in the setting of worsening renal failure with tubular necrosis, dysphagia, bradycardia, CHF, diabetes, a-fib, hypoxic respiratory failure, and deconditioning.   Discharge Planning:  Home with Hospice  Recommendations:  DNR/DNI-as confirmed  Transition all care to comfort at family's request  Family requesting patient to discharge home with granddaughter with outpatient hospice support.   Will need EMS transport home and home equipment (bed, suction, oxygen,  table). Referral placed and [place], [place] notified.   Will discontinue all orders not comfort focused  Fentanyl PRN for pain/air hunger (per family patient has intolerance to oxycodone)  Robinul PRN for secretions  Haldol PRN for agitation/anxiety  Liquifilm tears PRN for dry eyes  Family visitation per policy. Family educated on restrictions.   PMT will continue to support and follow as needed. I will be off service after today. Please call PMT team line for urgent needs [phone].    Palliative Performance Scale: PPS 20%                Patient/family expressed understanding and was in agreement with this plan.   Thank you for allowing the Palliative Medicine Team to assist in the care of this patient.  Time: 1798-1025 Time: 1330-1355 Time Total: 85 min.    Visit consisted of counseling and education dealing with the complex and emotionally intense issues of symptom management and palliative care in the setting of serious and potentially life-threatening illness.Greater than 50%  of this time was spent counseling and coordinating care related to the above assessment and plan.  Signed by:  Alda Lea, AGPCNP-BC Palliative Medicine Team  Phone: [phone] Fax: [phone] Pager: [phone] Amion: Bjorn Pippin

## 2019-10-29 NOTE — TOC Progression Note (Signed)
Transition of Care [facility]) - Progression Note    Patient Details  Name: REGIS WILAND MRN: [id number] Date of Birth: 11/16/1932  Transition of Care [facility]) CM/SW [place], RN Phone Number: 10/29/2019, 2:30 PM  Clinical Narrative:    Patient status change to comfort care this afternoon after Palliative consult.  Patient's plan is to discharge to granddaughters condo tomorrow after DME: Hospital Bed and table, oxygen and suction are delivered to the home. Will follow up in morning to verify DME delivery.  Santiago Glad with [facility] is working to schedule DME.   Expected Discharge Plan: Skilled Nursing Facility Barriers to Discharge: Continued Medical Work up  Expected Discharge Plan and Services Expected Discharge Plan: [place]   Discharge Planning Services: CM Consult   Living arrangements for the past 2 months: [place]                                       Social Determinants of Health (SDOH) Interventions    Readmission Risk Interventions Readmission Risk Prevention Plan 06/25/2019 12/16/2018 12/15/2018  Transportation Screening - Complete Complete  PCP or Specialist Appt within 5-7 Days - - -  PCP or Specialist Appt within 3-5 Days - Complete Patient refused  Home Care Screening - - -  Medication Review (RN CM) - - -  [place] or [place] Complete Complete Complete  Social Work Consult for [place] Planning/Counseling Complete Complete Complete  Palliative Care Screening - Not Applicable Not Applicable  Medication Review Press photographer) - Complete Complete  Med Review Comments - - -  PCP or Specialist appointment within 3-5 days of discharge - - -  PCP/Specialist Appt Not Complete comments - - -  [place] or [place] recent data might be hidden

## 2019-10-29 NOTE — Progress Notes (Addendum)
New referral for [facility] hospice at home received from [facility]. Patient information given to referral. Writer spoke via telephone to patient's granddaughter Teola Bradley ([phone]) to provide education regarding hospice services, philosophy and team approach to care with understanding voiced. DME needs discussed: hospital bed, over bed table, suction and oxygen concentrator, all ordered for delivery this evening with plan for discharge home via EMS tomorrow. Patient will need a signed out of facility DNR and prescriptions for comfort medications to accompany her at d/c.  Please note patient will be discharging to [address] [facility] [id number], [place]. TOC Ginny Russoli aware. Attending physician notified via Epic chat of need for DNR and prescriptions. Will continue to follow through discharge.Thank you for the opportunity to be involved in the care of this patient and her family.  Flo Shanks BSN, RN, [facility] Liaison [phone]

## 2019-10-29 NOTE — Progress Notes (Signed)
Gave update to family, [name], over the phone and addressed any questions she may have had. Will continue to monitor patient.

## 2019-10-30 ENCOUNTER — Telehealth: Payer: Self-pay | Admitting: Family Medicine

## 2019-10-30 DIAGNOSIS — Z515 Encounter for palliative care: Secondary | ICD-10-CM

## 2019-10-30 LAB — RESPIRATORY PANEL BY RT PCR (FLU A&B, COVID)
Influenza A by PCR: NEGATIVE
Influenza B by PCR: NEGATIVE
SARS Coronavirus 2 by RT PCR: NEGATIVE

## 2019-10-30 MED ORDER — HALOPERIDOL 1 MG PO TABS: 1.0000 mg | ORAL_TABLET | ORAL | 0 refills | Status: AC | PRN

## 2019-10-30 MED ORDER — HYDROMORPHONE HCL 2 MG PO TABS: 2.0000 mg | ORAL_TABLET | ORAL | 0 refills | Status: AC | PRN

## 2019-10-30 MED ORDER — ONDANSETRON HCL 4 MG PO TABS: 4.0000 mg | ORAL_TABLET | Freq: Every day | ORAL | 1 refills | Status: AC | PRN

## 2019-10-30 MED ORDER — LORAZEPAM 0.5 MG PO TABS: 0.5000 mg | ORAL_TABLET | ORAL | 0 refills | Status: AC | PRN

## 2019-10-30 NOTE — Telephone Encounter (Signed)
It's ok to give verbal orders for hospice.

## 2019-10-30 NOTE — Discharge Summary (Signed)
Physician Discharge Summary  Diane Mullins:[id number] DOB: April 30, 1933 DOA: 10/22/2019  PCP: Diane Haven, MD  Admit date: 10/22/2019 Discharge date: 10/30/2019  Admitted From: Home Disposition: Home with hospice Discharge Condition: Hospice CODE STATUS: DNR Diet recommendation: Regular/dysphagia  Brief/Interim Summary: Diane Mullins an 86 y.o.Caucasian femalewith past medical history significant for atrial fibrillation/flutter, s/p TEE/DCCV (2/4/2020and again November 26, 486, diastolic heart failure, pulmonary hypertension, diabetes type 2, hypertension, prior stroke/TIA, GI bleed March 2020 , recent fall October 13, 2019 gait instability presenting to the hospital with weakness, bradycardia.  2/10: Patient seen.  Exam deferred given comfort measures status.  Palliative care met with the patient's family today and after repeated discussions the family have elected to pursue comfort measures with ultimate goal of bringing patient back home with hospice services.  The scope of hospice services was explained at length to the patient's daughter at bedside.  All questions were answered.  2/11: Patient seen.  Exam deferred.  Plan to discharge home with hospice services today.  Per daughter's request we will place Foley catheter prior to discharge.  We will also send rapid Covid test prior to discharge.  Discussed with hospice representative.  2-week supply of all necessary comfort medications will be sent to patient's outpatient pharmacy.  Discharge Diagnoses:  Principal Problem:   Bradycardia Active Problems:   Hypertension   Diabetes ([place])   (HFpEF) heart failure with preserved ejection fraction (HCC)   Atrial flutter (HCC)   Fall   Atrial fibrillation [facility])   Hospice care patient   Palliative care patient  Acute hypoxic respiratory failure Fluid overload Symptomatic bradycardia Worsening acute kidney injury, likely acute tubular necrosis  2/10: Long conversation with  patient's daughter at bedside today in conjunction with the palliative care service.  Explained with the meaning of hospice was and what services they will be able to provide.  The patient has significant and worsening renal function now with a creatinine of 6 and an uptrending BUN.  Mental status is waxing and waning.  The patient remains lethargic however on prompting is able to answer questions about orientation and is actually oriented x3.  Despite this the numerous medical comorbidities as well as her advanced age and decreased likelihood of any meaningful functional recovery I do feel that hospice is an appropriate disposition at this point.  Greatly appreciate palliative care and involvement.  Currently will plan to discharge home with hospice services once all DME is delivered to the house.  While inpatient will remove all medications not focused on patient comfort.  Remainder inpatient management will be focused on symptom control.  2/11: DME delivered to patient's home.  Patient discharged and further care assumed by hospice staff  Discharge Instructions  Discharge Instructions    Diet - low sodium heart healthy   Complete by: As directed    Increase activity slowly   Complete by: As directed      Allergies as of 10/30/2019      Reactions   Oxycodone    Other reaction(s): Confusion Patient's daughter reported confusion/sensitivity to oxycodone.      Medication List    STOP taking these medications   amiodarone 200 MG tablet Commonly known as: PACERONE   carvedilol 3.125 MG tablet Commonly known as: Coreg   cephALEXin 500 MG capsule Commonly known as: KEFLEX   cyanocobalamin 1000 MCG tablet Commonly known as: CVS VITAMIN B12   cyanocobalamin 1000 MCG/ML injection Commonly known as: (VITAMIN B-12)   ferrous sulfate 325 (  65 FE) MG tablet Commonly known as: FeroSul   furosemide 40 MG tablet Commonly known as: Lasix   Levemir FlexTouch 100 UNIT/ML Pen Generic drug:  Insulin Detemir   levothyroxine 150 MCG tablet Commonly known as: SYNTHROID   meclizine 25 MG tablet Commonly known as: ANTIVERT   metFORMIN 1000 MG tablet Commonly known as: GLUCOPHAGE   mirtazapine 7.5 MG tablet Commonly known as: REMERON   ondansetron 4 MG disintegrating tablet Commonly known as: ZOFRAN-ODT   pantoprazole 40 MG tablet Commonly known as: Protonix   polyethylene glycol 17 g packet Commonly known as: MIRALAX / GLYCOLAX   potassium chloride 10 MEQ tablet Commonly known as: KLOR-CON   simvastatin 20 MG tablet Commonly known as: ZOCOR   True Metrix Blood Glucose Test test strip Generic drug: glucose blood     TAKE these medications   acetaminophen 325 MG tablet Commonly known as: TYLENOL Take 650 mg by mouth every 6 (six) hours as needed for mild pain or moderate pain.   haloperidol 1 MG tablet Commonly known as: HALDOL Take 1 tablet (1 mg total) by mouth every 4 (four) hours as needed for up to 14 days for agitation.   HYDROmorphone 2 MG tablet Commonly known as: Dilaudid Take 1 tablet (2 mg total) by mouth every 4 (four) hours as needed for up to 14 days for moderate pain or severe pain (air hunger; palliative care/hospice).   LORazepam 0.5 MG tablet Commonly known as: Ativan Take 1 tablet (0.5 mg total) by mouth every 4 (four) hours as needed for up to 14 days for anxiety.   ondansetron 4 MG tablet Commonly known as: Zofran Take 1 tablet (4 mg total) by mouth daily as needed for nausea or vomiting. What changed:   when to take this  reasons to take this       Allergies  Allergen Reactions  . Oxycodone     Other reaction(s): Confusion Patient's daughter reported confusion/sensitivity to oxycodone.     Consultations:  Palliative care   Procedures/Studies: DG Chest 1 View  Result Date: 10/25/2019 CLINICAL DATA:  Shortness of breath EXAM: CHEST  1 VIEW COMPARISON:  10/22/2019 FINDINGS: Heart size is enlarged. There are  worsening interstitial lung markings bilaterally. There is no pneumothorax. There may be small bilateral pleural effusions, right greater than left, new from prior study. There is no acute osseous abnormality. IMPRESSION: Cardiomegaly with findings suggestive of congestive heart failure. A superimposed atypical infectious process cannot be excluded. Electronically Signed   By: Constance Holster M.D.   On: 10/25/2019 02:16   DG Wrist Complete Left  Result Date: 10/13/2019 CLINICAL DATA:  Fall EXAM: LEFT WRIST - COMPLETE 3+ VIEW COMPARISON:  None. FINDINGS: No acute displaced fracture or malalignment. Advanced arthritis at the first [facility] joint. Vascular calcifications. Soft tissue swelling IMPRESSION: No acute osseous abnormality Electronically Signed   By: Donavan Foil M.D.   On: 10/13/2019 21:42   CT Head Wo Contrast  Result Date: 10/13/2019 CLINICAL DATA:  Headache.  Fall. EXAM: CT HEAD WITHOUT CONTRAST TECHNIQUE: Contiguous axial images were obtained from the base of the skull through the vertex without intravenous contrast. COMPARISON:  10/06/2019 FINDINGS: Brain: There is atrophy and chronic small vessel disease changes. No acute intracranial abnormality. Specifically, no hemorrhage, hydrocephalus, mass lesion, acute infarction, or significant intracranial injury. Vascular: No hyperdense vessel or unexpected calcification. Skull: No acute calvarial abnormality. Sinuses/Orbits: Visualized paranasal sinuses and mastoids clear. Orbital soft tissues unremarkable. Other: Soft tissue swelling in the left  forehead. IMPRESSION: Atrophy, chronic microvascular disease. No acute intracranial abnormality. Electronically Signed   By: Rolm Baptise M.D.   On: 10/13/2019 21:46   CT Head Wo Contrast  Result Date: 10/06/2019 CLINICAL DATA:  Transient ischemic attack (TIA). EXAM: CT HEAD WITHOUT CONTRAST TECHNIQUE: Contiguous axial images were obtained from the base of the skull through the vertex without intravenous  contrast. COMPARISON:  Head CT 06/16/2019, brain MRI 12/14/2018 FINDINGS: Brain: No evidence of acute intracranial hemorrhage. No demarcated cortical infarction. No evidence of intracranial mass. No midline shift or extra-axial fluid collection. Moderate patchy hypoattenuation within the cerebral white matter is nonspecific, but consistent with chronic small vessel ischemic disease. Moderate generalized parenchymal atrophy. Vascular: No hyperdense vessel.  Atherosclerotic calcifications. Skull: Normal. Negative for fracture or focal lesion. Sinuses/Orbits: Visualized orbits demonstrate no acute abnormality. No significant paranasal sinus disease or mastoid effusion at the imaged levels. IMPRESSION: No evidence of acute intracranial abnormality. Moderate generalized parenchymal atrophy and chronic small vessel ischemic disease. Electronically Signed   By: Kellie Simmering DO   On: 10/06/2019 17:43   CT Cervical Spine Wo Contrast  Result Date: 10/13/2019 CLINICAL DATA:  Fall EXAM: CT CERVICAL SPINE WITHOUT CONTRAST TECHNIQUE: Multidetector CT imaging of the cervical spine was performed without intravenous contrast. Multiplanar CT image reconstructions were also generated. COMPARISON:  None. FINDINGS: Alignment: No subluxation. Skull base and vertebrae: No acute fracture. No primary bone lesion or focal pathologic process. Soft tissues and spinal canal: No prevertebral fluid or swelling. No visible canal hematoma. Disc levels: Advanced diffuse degenerative facet disease bilaterally. Moderate degenerative disc disease most pronounced at C5-6 and C6-7. Upper chest: Negative Other: None IMPRESSION: Degenerative disc and facet disease.  No acute bony abnormality. Electronically Signed   By: Rolm Baptise M.D.   On: 10/13/2019 21:50   US RENAL  Result Date: 10/23/2019 CLINICAL DATA:  Inpatient.  Acute kidney injury. EXAM: RENAL / URINARY TRACT ULTRASOUND COMPLETE COMPARISON:  12/11/2018 CT abdomen/pelvis FINDINGS: Right  Kidney: Renal measurements: 11.4 x 5.4 x 5.2 cm = volume: 168 mL. Mildly echogenic renal parenchyma, normal thickness (10 mm). No hydronephrosis. No renal mass. Left Kidney: Renal measurements: 10.8 x 5.4 x 4.8 cm = volume: 149 mL. Mildly echogenic renal parenchyma, normal thickness (10 mm). No hydronephrosis. No renal mass. Bladder: Nearly collapsed and grossly normal. Other: None. IMPRESSION: 1. No hydronephrosis. 2. Normal size mildly echogenic kidneys, compatible with reported history of acute nonspecific renal parenchymal disease. Electronically Signed   By: Ilona Sorrel M.D.   On: 10/23/2019 09:08   DG Chest Portable 1 View  Result Date: 10/22/2019 CLINICAL DATA:  Status post fall 10/20/2019. Weakness and bradycardia today. EXAM: PORTABLE CHEST 1 VIEW COMPARISON:  Single-view of the chest 10/06/2019. PA and lateral chest 06/23/2019. FINDINGS: There is cardiomegaly without edema. No pneumothorax or pleural fluid. No acute or focal bony abnormality. IMPRESSION: Cardiomegaly without acute disease. Electronically Signed   By: Inge Rise M.D.   On: 10/22/2019 11:35   DG Chest Portable 1 View  Result Date: 10/06/2019 CLINICAL DATA:  Dehydration, pitting edema in lower extremities, nausea, vomiting, diabetes mellitus, hypertension, CHF, atrial fibrillation EXAM: PORTABLE CHEST 1 VIEW COMPARISON:  Portable exam 1611 hours compared to 06/23/2019 FINDINGS: Enlargement of cardiac silhouette with pulmonary vascular congestion. Minimal interstitial infiltrates in both lungs consistent with minimal pulmonary edema. No pleural effusion or pneumothorax. Atherosclerotic calcification aorta. Bones demineralized. IMPRESSION: Enlargement of cardiac silhouette with pulmonary vascular congestion and minimal pulmonary edema. Electronically Signed   By:  Lavonia Dana M.D.   On: 10/06/2019 16:50   ECHOCARDIOGRAM COMPLETE  Result Date: 10/23/2019   ECHOCARDIOGRAM REPORT   Patient Name:   CAERA ENWRIGHT Date of Exam:  10/23/2019 Medical Rec #:  [id number]     Height:       60.0 in Accession #:    [id number]    Weight:       200.0 lb Date of Birth:  20-Jun-1933      BSA:          1.87 m Patient Age:    34 years      BP:           104/36 mmHg Patient Gender: F             HR:           42 bpm. Exam Location:  [facility] Procedure: 2D Echo Indications:     ATRIAL FIBRILLATION 427.31/I48.91  History:         Patient has prior history of Echocardiogram examinations, most                  recent 06/17/2019. TIA, Arrythmias:Atrial Flutter,                  Signs/Symptoms:Shortness of Breath; Risk Factors:Dyslipidemia                  and Diabetes.  Sonographer:     Avanell Shackleton Referring Phys:  [id number] Arvil Chaco Diagnosing Phys: Ida Rogue MD  Sonographer Comments: Technically difficult study due to poor echo windows. Image acquisition challenging due to patient body habitus. PT WAS UNABLE TO TURN ON HER LEFT SIDE IMPRESSIONS  1. Left ventricular ejection fraction, by visual estimation, is 55 to 60%. The left ventricle has normal function. There is no left ventricular hypertrophy.  2. The left ventricle has no regional wall motion abnormalities.  3. Global right ventricle has normal systolic function.The right ventricular size is mildly enlarged. No increase in right ventricular wall thickness.  4. Left atrial size was severely dilated.  5. Moderately elevated pulmonary artery systolic pressure.  6. The tricuspid regurgitant velocity is 3.45 m/s, and with an assumed right atrial pressure of 10 mmHg, the estimated right ventricular systolic pressure is moderately elevated at 57.7 mmHg.  7. Bradycardia noted, rate 41 bpm FINDINGS  Left Ventricle: Left ventricular ejection fraction, by visual estimation, is 55 to 60%. The left ventricle has normal function. The left ventricle has no regional wall motion abnormalities. There is no left ventricular hypertrophy. Normal left atrial pressure. Right Ventricle: The right ventricular size is  mildly enlarged. No increase in right ventricular wall thickness. Global RV systolic function is has normal systolic function. The tricuspid regurgitant velocity is 3.45 m/s, and with an assumed right atrial  pressure of 10 mmHg, the estimated right ventricular systolic pressure is moderately elevated at 57.7 mmHg. Left Atrium: Left atrial size was severely dilated. Right Atrium: Right atrial size was normal in size Pericardium: There is no evidence of pericardial effusion. Mitral Valve: The mitral valve is normal in structure. Mild mitral valve regurgitation. No evidence of mitral valve stenosis by observation. Tricuspid Valve: The tricuspid valve is normal in structure. Tricuspid valve regurgitation is mild-moderate. Aortic Valve: The aortic valve was not well visualized. Aortic valve regurgitation is not visualized. The aortic valve is structurally normal, with no evidence of sclerosis or stenosis. Pulmonic Valve: The pulmonic valve was normal in structure. Pulmonic valve regurgitation is  trivial. Pulmonic regurgitation is trivial. Aorta: The aortic root, ascending aorta and aortic arch are all structurally normal, with no evidence of dilitation or obstruction. Venous: The inferior vena cava is normal in size with greater than 50% respiratory variability, suggesting right atrial pressure of 3 mmHg. IAS/Shunts: No atrial level shunt detected by color flow Doppler. There is no evidence of a patent foramen ovale. No ventricular septal defect is seen or detected. There is no evidence of an atrial septal defect.  LEFT VENTRICLE PLAX 2D LVIDd:         4.07 cm LVIDs:         2.15 cm LV PW:         1.33 cm LV IVS:        1.17 cm LVOT diam:     1.90 cm LV SV:         58 ml LV SV Index:   28.68 LVOT Area:     2.84 cm  LEFT ATRIUM              Index       RIGHT ATRIUM           Index LA diam:        3.60 cm  1.93 cm/m  RA Area:     23.50 cm LA Vol (A2C):   119.0 ml 63.75 ml/m RA Volume:   81.70 ml  43.77 ml/m LA Vol  (A4C):   96.0 ml  51.43 ml/m LA Biplane Vol: 111.0 ml 59.47 ml/m   AORTA Ao Root diam: 3.40 cm MR Peak grad: 78.1 mmHg   TRICUSPID VALVE MR Mean grad: 51.0 mmHg   TR Peak grad:   47.7 mmHg MR Vmax:      442.00 cm/s TR Vmax:        369.00 cm/s MR Vmean:     336.0 cm/s                           SHUNTS                           Systemic Diam: 1.90 cm  Ida Rogue MD Electronically signed by Ida Rogue MD Signature Date/Time: 10/23/2019/7:19:31 PM    Final     (Echo, Carotid, EGD, Colonoscopy, ERCP)    Subjective: Seen, exam deferred No visible distress  Discharge Exam: Vitals:   10/29/19 1132 10/30/19 0749  BP: (!) 120/58 (!) 124/59  Pulse: 64 66  Resp: 18 16  Temp: 97.6 F (36.4 C) 97.9 F (36.6 C)  SpO2: 96% 96%   Vitals:   10/29/19 0300 10/29/19 0807 10/29/19 1132 10/30/19 0749  BP:  (!) 136/58 (!) 120/58 (!) 124/59  Pulse: 65 63 64 66  Resp:  _0 Temp:  (!) 97.4 F (36.3 C) 97.6 F (36.4 C) 97.9 F (36.6 C)  TempSrc:  Oral Oral Oral  SpO2: 94% 98% 96% 96%  Weight:      Height:        Exam deferred given comfort measures status    The results of significant diagnostics from this hospitalization (including imaging, microbiology, ancillary and laboratory) are listed below for reference.     Microbiology: Recent Results (from the past 240 hour(s))  SARS CORONAVIRUS 2 (TAT 6-24 HRS) Nasopharyngeal Nasopharyngeal Swab     Status: None   Collection Time: 10/22/19  1:33 PM   Specimen: Nasopharyngeal Swab  Result Value Ref Range Status   SARS Coronavirus 2 NEGATIVE NEGATIVE Final    Comment: (NOTE) SARS-CoV-2 target nucleic acids are NOT DETECTED. The SARS-CoV-2 RNA is generally detectable in upper and lower respiratory specimens during the acute phase of infection. Negative results do not preclude SARS-CoV-2 infection, do not rule out co-infections with other pathogens, and should not be used as the sole basis for treatment or other patient management  decisions. Negative results must be combined with clinical observations, patient history, and epidemiological information. The expected result is Negative. Fact Sheet for Patients: SugarRoll.be Fact Sheet for Healthcare Providers: https://www.woods-mathews.com/ This test is not yet approved or cleared by the [place] FDA and  has been authorized for detection and/or diagnosis of SARS-CoV-2 by FDA under an Emergency Use Authorization (EUA). This EUA will remain  in effect (meaning this test can be used) for the duration of the COVID-19 declaration under Section 56 4(b)(1) of the Act, 21 U.S.C. section 360bbb-3(b)(1), unless the authorization is terminated or revoked sooner. Performed at [facility], [place] [address] [place], [place] [address]   MRSA PCR Screening     Status: None   Collection Time: 10/26/19  6:30 AM   Specimen: Nasopharyngeal  Result Value Ref Range Status   MRSA by PCR NEGATIVE NEGATIVE Final    Comment:        The GeneXpert MRSA Assay (FDA approved for NASAL specimens only), is one component of a comprehensive MRSA colonization surveillance program. It is not intended to diagnose MRSA infection nor to guide or monitor treatment for MRSA infections. Performed at [facility], [address] [place], [place] [address]   Respiratory Panel by RT PCR (Flu A&B, Covid) - Nasopharyngeal Swab     Status: None   Collection Time: 10/30/19 10:20 AM   Specimen: Nasopharyngeal Swab  Result Value Ref Range Status   SARS Coronavirus 2 by RT PCR NEGATIVE NEGATIVE Final    Comment: (NOTE) SARS-CoV-2 target nucleic acids are NOT DETECTED. The SARS-CoV-2 RNA is generally detectable in upper respiratoy specimens during the acute phase of infection. The lowest concentration of SARS-CoV-2 viral copies this assay can detect is 131 copies/mL. A negative result does not preclude SARS-Cov-2 infection and should not be  used as the sole basis for treatment or other patient management decisions. A negative result may occur with  improper specimen collection/handling, submission of specimen other than nasopharyngeal swab, presence of viral mutation(s) within the areas targeted by this assay, and inadequate number of viral copies (<131 copies/mL). A negative result must be combined with clinical observations, patient history, and epidemiological information. The expected result is Negative. Fact Sheet for Patients:  PinkCheek.be Fact Sheet for Healthcare Providers:  GravelBags.it This test is not yet ap proved or cleared by the [place] FDA and  has been authorized for detection and/or diagnosis of SARS-CoV-2 by FDA under an Emergency Use Authorization (EUA). This EUA will remain  in effect (meaning this test can be used) for the duration of the COVID-19 declaration under Section 564(b)(1) of the Act, 21 U.S.C. section 360bbb-3(b)(1), unless the authorization is terminated or revoked sooner.    Influenza A by PCR NEGATIVE NEGATIVE Final   Influenza B by PCR NEGATIVE NEGATIVE Final    Comment: (NOTE) The Xpert Xpress SARS-CoV-2/FLU/RSV assay is intended as an aid in  the diagnosis of influenza from Nasopharyngeal swab specimens and  should not be used as a sole basis for treatment. Nasal washings and  aspirates  are unacceptable for Xpert Xpress SARS-CoV-2/FLU/RSV  testing. Fact Sheet for Patients: PinkCheek.be Fact Sheet for Healthcare Providers: GravelBags.it This test is not yet approved or cleared by the [place] FDA and  has been authorized for detection and/or diagnosis of SARS-CoV-2 by  FDA under an Emergency Use Authorization (EUA). This EUA will remain  in effect (meaning this test can be used) for the duration of the  Covid-19 declaration under Section 564(b)(1) of the  Act, 21  U.S.C. section 360bbb-3(b)(1), unless the authorization is  terminated or revoked. Performed at [facility], [address] [place], [place] [address]      Labs: BNP (last 3 results) Recent Labs    11/25/18 1904 06/16/19 1055  BNP 759.0* 604.5*   Basic Metabolic Panel: Recent Labs  Lab 10/25/19 0425 10/25/19 0425 10/26/19 0604 10/26/19 1612 10/27/19 1122 10/28/19 0647 10/29/19 0740  NA 134*   < > 133* 134* 134* 135 136  K 5.8*   < > 6.0* 4.7 4.5 4.1 3.7  CL 89*   < > 86* 87* 84* 82* 84*  CO2 28   < > 30 32 27 32 34*  GLUCOSE 164*   < > 144* 136* 128* 127* 118*  BUN 45*   < > 54* 55* 57* 60* 67*  CREATININE 5.76*   < > 6.07* 6.06* 6.06* 6.12* 6.21*  CALCIUM 7.8*   < > 7.7* 7.5* 8.0* 8.0* 7.9*  MG 1.9  --  1.9  --  1.5* 1.7 1.9   < > = values in this interval not displayed.   Liver Function Tests: No results for input(s): AST, ALT, ALKPHOS, BILITOT, PROT, ALBUMIN in the last 168 hours. No results for input(s): LIPASE, AMYLASE in the last 168 hours. No results for input(s): AMMONIA in the last 168 hours. CBC: Recent Labs  Lab 10/25/19 0425 10/26/19 0604 10/27/19 1122 10/28/19 0647 10/29/19 0740  WBC 9.3 8.8 5.9 6.3 5.5  HGB 11.4* 11.8* 14.1 12.5 11.5*  HCT 34.5* 35.6* 41.7 38.0 34.3*  MCV 88.9 87.7 86.3 86.6 87.7  PLT 248 268 202 277 262   Cardiac Enzymes: No results for input(s): CKTOTAL, CKMB, CKMBINDEX, TROPONINI in the last 168 hours. BNP: Invalid input(s): POCBNP CBG: Recent Labs  Lab 10/23/19 2046 10/24/19 0139 10/24/19 0833 10/25/19 0005 10/25/19 0150  GLUCAP 139* 127* 137* 177* 193*   D-Dimer No results for input(s): DDIMER in the last 72 hours. Hgb A1c No results for input(s): HGBA1C in the last 72 hours. Lipid Profile No results for input(s): CHOL, HDL, LDLCALC, TRIG, CHOLHDL, LDLDIRECT in the last 72 hours. Thyroid function studies No results for input(s): TSH, T4TOTAL, T3FREE, THYROIDAB in the last 72  hours.  Invalid input(s): FREET3 Anemia work up No results for input(s): VITAMINB12, FOLATE, FERRITIN, TIBC, IRON, RETICCTPCT in the last 72 hours. Urinalysis    Component Value Date/Time   COLORURINE YELLOW (A) 10/25/2019 1257   APPEARANCEUR CLEAR (A) 10/25/2019 1257   LABSPEC 1.008 10/25/2019 1257   PHURINE 6.0 10/25/2019 1257   GLUCOSEU NEGATIVE 10/25/2019 1257   GLUCOSEU NEGATIVE 09/29/2019 1035   HGBUR SMALL (A) 10/25/2019 1257   BILIRUBINUR NEGATIVE 10/25/2019 1257   BILIRUBINUR negative 07/23/2017 1327   KETONESUR NEGATIVE 10/25/2019 1257   PROTEINUR NEGATIVE 10/25/2019 1257   UROBILINOGEN 0.2 09/29/2019 1035   NITRITE NEGATIVE 10/25/2019 1257   LEUKOCYTESUR MODERATE (A) 10/25/2019 1257   Sepsis Labs Invalid input(s): PROCALCITONIN,  WBC,  LACTICIDVEN Microbiology Recent Results (from the past 240 hour(s))  SARS CORONAVIRUS 2 (TAT 6-24 HRS) Nasopharyngeal Nasopharyngeal Swab     Status: None   Collection Time: 10/22/19  1:33 PM   Specimen: Nasopharyngeal Swab  Result Value Ref Range Status   SARS Coronavirus 2 NEGATIVE NEGATIVE Final    Comment: (NOTE) SARS-CoV-2 target nucleic acids are NOT DETECTED. The SARS-CoV-2 RNA is generally detectable in upper and lower respiratory specimens during the acute phase of infection. Negative results do not preclude SARS-CoV-2 infection, do not rule out co-infections with other pathogens, and should not be used as the sole basis for treatment or other patient management decisions. Negative results must be combined with clinical observations, patient history, and epidemiological information. The expected result is Negative. Fact Sheet for Patients: SugarRoll.be Fact Sheet for Healthcare Providers: https://www.woods-mathews.com/ This test is not yet approved or cleared by the [place] FDA and  has been authorized for detection and/or diagnosis of SARS-CoV-2 by FDA under an Emergency  Use Authorization (EUA). This EUA will remain  in effect (meaning this test can be used) for the duration of the COVID-19 declaration under Section 56 4(b)(1) of the Act, 21 U.S.C. section 360bbb-3(b)(1), unless the authorization is terminated or revoked sooner. Performed at [facility], [place] [address], [place], [place] [address]   MRSA PCR Screening     Status: None   Collection Time: 10/26/19  6:30 AM   Specimen: Nasopharyngeal  Result Value Ref Range Status   MRSA by PCR NEGATIVE NEGATIVE Final    Comment:        The GeneXpert MRSA Assay (FDA approved for NASAL specimens only), is one component of a comprehensive MRSA colonization surveillance program. It is not intended to diagnose MRSA infection nor to guide or monitor treatment for MRSA infections. Performed at [facility], [address] [place], [place] [address]   Respiratory Panel by RT PCR (Flu A&B, Covid) - Nasopharyngeal Swab     Status: None   Collection Time: 10/30/19 10:20 AM   Specimen: Nasopharyngeal Swab  Result Value Ref Range Status   SARS Coronavirus 2 by RT PCR NEGATIVE NEGATIVE Final    Comment: (NOTE) SARS-CoV-2 target nucleic acids are NOT DETECTED. The SARS-CoV-2 RNA is generally detectable in upper respiratoy specimens during the acute phase of infection. The lowest concentration of SARS-CoV-2 viral copies this assay can detect is 131 copies/mL. A negative result does not preclude SARS-Cov-2 infection and should not be used as the sole basis for treatment or other patient management decisions. A negative result may occur with  improper specimen collection/handling, submission of specimen other than nasopharyngeal swab, presence of viral mutation(s) within the areas targeted by this assay, and inadequate number of viral copies (<131 copies/mL). A negative result must be combined with clinical observations, patient history, and epidemiological information. The expected result is  Negative. Fact Sheet for Patients:  PinkCheek.be Fact Sheet for Healthcare Providers:  GravelBags.it This test is not yet ap proved or cleared by the [place] FDA and  has been authorized for detection and/or diagnosis of SARS-CoV-2 by FDA under an Emergency Use Authorization (EUA). This EUA will remain  in effect (meaning this test can be used) for the duration of the COVID-19 declaration under Section 564(b)(1) of the Act, 21 U.S.C. section 360bbb-3(b)(1), unless the authorization is terminated or revoked sooner.    Influenza A by PCR NEGATIVE NEGATIVE Final   Influenza B by PCR NEGATIVE NEGATIVE Final    Comment: (NOTE) The Xpert Xpress SARS-CoV-2/FLU/RSV assay is intended as  an aid in  the diagnosis of influenza from Nasopharyngeal swab specimens and  should not be used as a sole basis for treatment. Nasal washings and  aspirates are unacceptable for Xpert Xpress SARS-CoV-2/FLU/RSV  testing. Fact Sheet for Patients: PinkCheek.be Fact Sheet for Healthcare Providers: GravelBags.it This test is not yet approved or cleared by the [place] FDA and  has been authorized for detection and/or diagnosis of SARS-CoV-2 by  FDA under an Emergency Use Authorization (EUA). This EUA will remain  in effect (meaning this test can be used) for the duration of the  Covid-19 declaration under Section 564(b)(1) of the Act, 21  U.S.C. section 360bbb-3(b)(1), unless the authorization is  terminated or revoked. Performed at [facility], [address] [place], [place] [address]      Time coordinating discharge: Over 30 minutes  SIGNED:   Sidney Ace, MD  [facility] 10/30/2019, 1:30 PM Pager   If 7PM-7AM, please contact night-coverage

## 2019-10-30 NOTE — Telephone Encounter (Signed)
Pt's is calling back regarding this. She said she really needs this paperwork. She also wants to know if her daughter would be able to get FMLA if both of them are taking care of her. She is requesting a call back as soon as possible.

## 2019-10-30 NOTE — Telephone Encounter (Signed)
Kristie with [facility] was calling to inform PCP that pt is being discharged from the hospital and needs to start hospice today. She would like verbal orders from Dr. Caryl Bis. Please call @ [phone]

## 2019-10-30 NOTE — Progress Notes (Signed)
Patient is being repositioned in bed every 2 hours. Pillows are being used to position and off take pressure.

## 2019-10-30 NOTE — TOC Progression Note (Signed)
Transition of Care [facility]) - Progression Note    Patient Details  Name: Diane Mullins MRN: [id number] Date of Birth: 06-11-1933  [facility]) CM/SW [place], RN Phone Number: 10/30/2019, 8:57 AM  Clinical Narrative:      Hospice DME delivered to granddaughters house, family has requested covid test before discharge.    Expected Discharge Plan: Skilled Nursing Facility Barriers to Discharge: Continued Medical Work up  Expected Discharge Plan and Services Expected Discharge Plan: [place]   Discharge Planning Services: CM Consult   Living arrangements for the past 2 months: [place]                                       Social Determinants of Health (SDOH) Interventions    Readmission Risk Interventions Readmission Risk Prevention Plan 06/25/2019 12/16/2018 12/15/2018  Transportation Screening - Complete Complete  PCP or Specialist Appt within 5-7 Days - - -  PCP or Specialist Appt within 3-5 Days - Complete Patient refused  Home Care Screening - - -  Medication Review (RN CM) - - -  [place] or [place] Complete Complete Complete  Social Work Consult for [place] Planning/Counseling Complete Complete Complete  Palliative Care Screening - Not Applicable Not Applicable  Medication Review Press photographer) - Complete Complete  Med Review Comments - - -  PCP or Specialist appointment within 3-5 days of discharge - - -  PCP/Specialist Appt Not Complete comments - - -  [place] or [place] recent data might be hidden

## 2019-10-30 NOTE — Telephone Encounter (Signed)
Today I received a call and gave a verbal for the patient to start hospice she was discharged from the hospital today.   Diane Mullins,cma

## 2019-10-30 NOTE — Telephone Encounter (Signed)
Just a FYI the patient daughter called about the FMLA, I informed her that you had them and I will call her when they are ready.  Gracemarie Skeet,cma

## 2019-10-30 NOTE — Care Management Important Message (Signed)
Important Message  Patient Details  Name: Diane Mullins MRN: [id number] Date of Birth: 11/13/1932   Medicare Important Message Given:  No  Discharged prior to arrival to unit to deliver concurrent Medicare IM.     Dannette Barbara 10/30/2019, 1:25 PM

## 2019-10-30 NOTE — Progress Notes (Addendum)
Patient is ready for discharge, DME delivered last evening. Foley Catheter placed by staff RN Langley Gauss at family request. Discharge summary to be faxed when posted. All comfort medication prescriptions have been sent to patient's pharmacy. Signed DNR in place in discharge packet. Writer spoke with patient's daughter [name] via telephone regarding prescriptions for comfort medications. TOC Ginnie Russoli has contacted EMS for non-emergent transport. Flo Shanks BSN, RN, [place] [phone]

## 2019-10-31 NOTE — Telephone Encounter (Signed)
Please contact the patient's daughter and see if there are specific dates that she needs the FMLA for.  Is it for while the patient was in the hospital and ongoing or is it for intermittent leave?

## 2019-10-31 NOTE — Telephone Encounter (Signed)
I called and spoke to the patient's daughter and she stated that this will be an ongoing leave because the patient is now at her daughter house and they are caring for her 24 hours a day with hospice. She would like for you to back date it to when she got out of the hospital 10/21/2019.  Haydn Cush,cma

## 2019-11-02 DIAGNOSIS — Z0279 Encounter for issue of other medical certificate: Secondary | ICD-10-CM

## 2019-11-02 NOTE — Telephone Encounter (Signed)
Completed. Please make available for pick up. I can also complete FMLA forms for the patients granddaughter if needed.

## 2019-11-03 NOTE — Telephone Encounter (Signed)
Patient's daughter called and paper work is ready for pick up at the front  Laraya Pestka,cma

## 2019-11-09 DIAGNOSIS — I959 Hypotension, unspecified: Secondary | ICD-10-CM | POA: Diagnosis not present

## 2019-11-09 DIAGNOSIS — Z743 Need for continuous supervision: Secondary | ICD-10-CM | POA: Diagnosis not present

## 2019-11-09 DIAGNOSIS — R404 Transient alteration of awareness: Secondary | ICD-10-CM | POA: Diagnosis not present

## 2019-11-09 DIAGNOSIS — R279 Unspecified lack of coordination: Secondary | ICD-10-CM | POA: Diagnosis not present

## 2019-11-15 NOTE — Telephone Encounter (Signed)
Error.  Emmaleah Meroney,cma  

## 2019-11-17 DIAGNOSIS — I5032 Chronic diastolic (congestive) heart failure: Secondary | ICD-10-CM | POA: Diagnosis not present

## 2019-11-21 ENCOUNTER — Telehealth: Payer: Self-pay

## 2019-11-21 NOTE — Telephone Encounter (Signed)
Noted  

## 2019-12-18 DIAGNOSIS — I5032 Chronic diastolic (congestive) heart failure: Secondary | ICD-10-CM | POA: Diagnosis not present

## 2019-12-18 DEATH — deceased

## 2020-04-01 ENCOUNTER — Ambulatory Visit: Payer: PPO
